# Patient Record
Sex: Female | Born: 1945 | Race: White | Hispanic: No | Marital: Single | Smoking: Current every day smoker
Health system: Southern US, Community
[De-identification: ages and names within clinical notes are randomized; demographics above are authoritative.]

## PROBLEM LIST (undated history)

## (undated) DIAGNOSIS — R110 Nausea: Secondary | ICD-10-CM

## (undated) DIAGNOSIS — Y92009 Unspecified place in unspecified non-institutional (private) residence as the place of occurrence of the external cause: Secondary | ICD-10-CM

## (undated) DIAGNOSIS — I10 Essential (primary) hypertension: Secondary | ICD-10-CM

## (undated) DIAGNOSIS — J9611 Chronic respiratory failure with hypoxia: Secondary | ICD-10-CM

## (undated) DIAGNOSIS — R634 Abnormal weight loss: Secondary | ICD-10-CM

## (undated) DIAGNOSIS — J189 Pneumonia, unspecified organism: Secondary | ICD-10-CM

## (undated) DIAGNOSIS — E871 Hypo-osmolality and hyponatremia: Secondary | ICD-10-CM

## (undated) DIAGNOSIS — R0902 Hypoxemia: Secondary | ICD-10-CM

## (undated) DIAGNOSIS — N951 Menopausal and female climacteric states: Secondary | ICD-10-CM

## (undated) DIAGNOSIS — J9690 Respiratory failure, unspecified, unspecified whether with hypoxia or hypercapnia: Secondary | ICD-10-CM

## (undated) DIAGNOSIS — F329 Major depressive disorder, single episode, unspecified: Secondary | ICD-10-CM

## (undated) DIAGNOSIS — W19XXXA Unspecified fall, initial encounter: Secondary | ICD-10-CM

## (undated) DIAGNOSIS — K219 Gastro-esophageal reflux disease without esophagitis: Secondary | ICD-10-CM

## (undated) DIAGNOSIS — E876 Hypokalemia: Secondary | ICD-10-CM

## (undated) DIAGNOSIS — F17200 Nicotine dependence, unspecified, uncomplicated: Secondary | ICD-10-CM

## (undated) DIAGNOSIS — J9621 Acute and chronic respiratory failure with hypoxia: Secondary | ICD-10-CM

## (undated) DIAGNOSIS — N179 Acute kidney failure, unspecified: Secondary | ICD-10-CM

## (undated) DIAGNOSIS — R911 Solitary pulmonary nodule: Secondary | ICD-10-CM

## (undated) DIAGNOSIS — E7800 Pure hypercholesterolemia, unspecified: Secondary | ICD-10-CM

## (undated) DIAGNOSIS — T50901A Poisoning by unspecified drugs, medicaments and biological substances, accidental (unintentional), initial encounter: Secondary | ICD-10-CM

## (undated) DIAGNOSIS — R197 Diarrhea, unspecified: Secondary | ICD-10-CM

## (undated) DIAGNOSIS — I4891 Unspecified atrial fibrillation: Secondary | ICD-10-CM

## (undated) DIAGNOSIS — R112 Nausea with vomiting, unspecified: Secondary | ICD-10-CM

## (undated) DIAGNOSIS — G9340 Encephalopathy, unspecified: Secondary | ICD-10-CM

## (undated) DIAGNOSIS — F39 Unspecified mood [affective] disorder: Secondary | ICD-10-CM

## (undated) DIAGNOSIS — I4719 Other supraventricular tachycardia: Secondary | ICD-10-CM

## (undated) DIAGNOSIS — I429 Cardiomyopathy, unspecified: Secondary | ICD-10-CM

## (undated) DIAGNOSIS — I471 Supraventricular tachycardia: Secondary | ICD-10-CM

## (undated) DIAGNOSIS — E785 Hyperlipidemia, unspecified: Secondary | ICD-10-CM

## (undated) DIAGNOSIS — M1990 Unspecified osteoarthritis, unspecified site: Secondary | ICD-10-CM

## (undated) DIAGNOSIS — K589 Irritable bowel syndrome without diarrhea: Secondary | ICD-10-CM

## (undated) DIAGNOSIS — I2510 Atherosclerotic heart disease of native coronary artery without angina pectoris: Secondary | ICD-10-CM

## (undated) DIAGNOSIS — J441 Chronic obstructive pulmonary disease with (acute) exacerbation: Secondary | ICD-10-CM

## (undated) DIAGNOSIS — F419 Anxiety disorder, unspecified: Secondary | ICD-10-CM

## (undated) DIAGNOSIS — R4182 Altered mental status, unspecified: Secondary | ICD-10-CM

## (undated) DIAGNOSIS — R600 Localized edema: Secondary | ICD-10-CM

## (undated) DIAGNOSIS — I959 Hypotension, unspecified: Secondary | ICD-10-CM

## (undated) DIAGNOSIS — G8929 Other chronic pain: Secondary | ICD-10-CM

## (undated) DIAGNOSIS — R609 Edema, unspecified: Secondary | ICD-10-CM

## (undated) HISTORY — DX: Hypoxemia: R09.02

## (undated) HISTORY — DX: Acute kidney failure, unspecified: N17.9

## (undated) HISTORY — DX: Acute and chronic respiratory failure with hypoxia: J96.21

## (undated) HISTORY — DX: Unspecified atrial fibrillation: I48.91

## (undated) HISTORY — DX: Respiratory failure, unspecified, unspecified whether with hypoxia or hypercapnia: J96.90

## (undated) HISTORY — DX: Hypokalemia: E87.6

## (undated) HISTORY — DX: Essential (primary) hypertension: I10

## (undated) HISTORY — PX: EYE SURGERY: SHX253

## (undated) HISTORY — PX: ABDOMINAL HYSTERECTOMY: SHX81

## (undated) HISTORY — DX: Diarrhea, unspecified: R19.7

## (undated) HISTORY — DX: Solitary pulmonary nodule: R91.1

## (undated) HISTORY — DX: Other supraventricular tachycardia: I47.19

## (undated) HISTORY — DX: Localized edema: R60.0

## (undated) HISTORY — DX: Abnormal weight loss: R63.4

## (undated) HISTORY — DX: Unspecified mood (affective) disorder: F39

## (undated) HISTORY — DX: Edema, unspecified: R60.9

## (undated) HISTORY — DX: Altered mental status, unspecified: R41.82

## (undated) HISTORY — DX: Atherosclerotic heart disease of native coronary artery without angina pectoris: I25.10

## (undated) HISTORY — DX: Chronic obstructive pulmonary disease with (acute) exacerbation: J44.1

## (undated) HISTORY — DX: Unspecified place in unspecified non-institutional (private) residence as the place of occurrence of the external cause: Y92.009

## (undated) HISTORY — DX: Hypotension, unspecified: I95.9

## (undated) HISTORY — DX: Nausea with vomiting, unspecified: R11.2

## (undated) HISTORY — DX: Hyperlipidemia, unspecified: E78.5

## (undated) HISTORY — DX: Encephalopathy, unspecified: G93.40

## (undated) HISTORY — DX: Cardiomyopathy, unspecified: I42.9

## (undated) HISTORY — DX: Nausea: R11.0

## (undated) HISTORY — DX: Supraventricular tachycardia: I47.1

## (undated) HISTORY — DX: Unspecified fall, initial encounter: W19.XXXA

## (undated) HISTORY — DX: Poisoning by unspecified drugs, medicaments and biological substances, accidental (unintentional), initial encounter: T50.901A

## (undated) HISTORY — DX: Hypo-osmolality and hyponatremia: E87.1

## (undated) SURGERY — UPPER ESOPHAGEAL ENDOSCOPIC ULTRASOUND (EUS)
Anesthesia: Monitor Anesthesia Care

## (undated) NOTE — *Deleted (*Deleted)
[facility]   Telephone:(336) [phone] Fax:(336) [phone]   Clinic Follow up Note   Patient Care Team: Salli Real, MD as PCP - General (Internal Medicine) Rennis Golden Lisette Abu, MD as PCP - Cardiology (Cardiology) Radonna Ricker, RN as Oncology Nurse Navigator Pollyann Samples, NP as Nurse Practitioner (Nurse Practitioner) Malachy Mood, MD as Consulting Physician (Hematology) Mansouraty, Netty Starring., MD as Consulting Physician (Gastroenterology)  Date of Service:  12/06/2019  CHIEF COMPLAINT: F/u pancreaticcancer  SUMMARY OF ONCOLOGIC HISTORY: Oncology History Overview Note  Cancer Staging Primary adenocarcinoma of body of pancreas [facility]) Staging form: Exocrine Pancreas, AJCC 8th Edition - Clinical stage from 07/08/2019: Stage III (cT4, cN1, cM0) - Signed by Malachy Mood, MD on 07/13/2019    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HCC)  12/21/2018 Imaging   CT AP w contrast IMPRESSION: 1. Large amount of stool at the level of the rectum. Correlate for symptoms of fecal impaction. 2. Persistent dilatation of the distal pancreatic duct with abrupt cutoff at the level of the pancreatic body. Further evaluation with a nonemergent outpatient contrast enhanced MRI is recommended. 3. Nonobstructive left nephrolithiasis. 4. Subacute anterior right rib fractures.   Aortic Atherosclerosis (ICD10-I70.0).   12/24/2018 Imaging   MRCP IMPRESSION: 1. Suspect a 3.5 cm infiltrating pancreatic body neoplasm causing obstruction of the main pancreatic duct and atrophy of the pancreatic tail. The pancreatic head appears normal. 2. Limited examination due to lack of IV contrast and breathing motion artifact. Difficult to assess the vascular structures for invasion. 3. Recommend endoscopic biopsy. A dedicated high-resolution pancreatic CT scan with contrast may be helpful for further evaluation. 4. No obvious metastatic disease.   12/26/2018 Tumor Marker   CA 19-9: 250     05/05/2019 Procedure   EUS by Dr. Dulce Sellar An irregular mass was identified in the pancreatic body. The mass was hypoechoic. The mass measured 24 mm by 17 mm in maximal cross-sectional diameter. The endosonographic borders were poorlydefined. The remainder of the pancreas was examined. The endosonographic appearance of parenchyma and the upstream pancreatic duct indicated duct dilation. Appeared to be tissue ingrowth into portions of the dilated pancreatic duct. Fine needle aspiration for cytology was performed.  A few abnormal lymph nodes were visualized in the peripancreatic region. The nodes were oval, hypoechoic and had well defined margins. IMPRESSION - A mass was identified in the pancreatic body. This was staged T3 N1 Mx byendosonographic criteria. Fine needle aspiration performed. 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 - A few abnormal lymph nodes were visualized in the peripancreatic region. - Pancreatic parenchymal abnormalities consisting of edema and stranding were noted in the genu of the pancreas and pancreatic body. - There was no evidence of significant pathology.   05/29/2019 Imaging   CT CAP w contrast IMPRESSION: 1. Interval increase in size of poorly defined, hypoenhancing, infiltrating tumor arising from the body and proximal tail of pancreas. 2. Progressive vascular involvement of the portal vein, portal venous confluence, splenic vein, and celiac artery. 3. No specific features of solid organ metastases or metastatic disease to the chest. 4. Similar appearance of tiny, upper lung zone predominant solid and ground-glass attenuating nodules which may be postinflammatory/infectious in etiology. 5. Aortic atherosclerosis, in addition to 3 vessel coronary artery disease. Please note that although the presence of coronary artery calcium documents the presence of coronary artery disease, the severity of this disease and any potential  stenosis cannot be assessed on this non-gated CT examination. Assessment for potential  risk factor modification, dietary therapy or pharmacologic therapy may be warranted, if clinically indicated.   Aortic Atherosclerosis (ICD10-I70.0).   07/08/2019 Procedure   Repeat EUS for biopsy and fiducial placement by Dr. Christella Hartigan   1. Irregularly shaped, heterogeneous, hypoechoic mass was noted in the body of the pancreas, measuring 2.7cm across. The mass clearly invades the portal vein and obstructs the main pancreatic duct causing upstream dilation (5-94mm in the body and tail). Preliminary cytology review was positive for malignancy (adenocarcinoma) and then 4 gold fiducials were placed into the mass using the [employer] placement system. 2. CBD was normal, non-dilated. 3. Limited views of the liver, spleen were normal.      07/08/2019 Pathology Results   FINAL MICROSCOPIC DIAGNOSIS:  - Malignant cells consistent with adenocarcinoma    07/08/2019 Cancer Staging   Staging form: Exocrine Pancreas, AJCC 8th Edition - Clinical stage from 07/08/2019: Stage III (cT4, cN1, cM0) - Signed by Malachy Mood, MD on 07/13/2019   07/12/2019 Initial Diagnosis   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HCC)   07/23/2019 - 08/06/2019 Chemotherapy   First-line Gemcitabine and Abraxane 2 weeks on/1 week off starting 07/23/19 with Gemcitabine alone for C1. Discontinued after 1 cycle due hospitalized twice for dyspnea, CHF and COPD exacerbation. I think her tolerance to chemo is very low.   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  The Common Hereditary Gene Panel offered by [facility] includes sequencing and/or deletion duplication testing of the following 56 genes: APC*, ATM*, AXIN2, BARD1, BMPR1A, BRCA1, BRCA2, BRIP1, CASR, CDH1, CDK4, CDKN2A (p14ARF), CDKN2A (p16INK4a),  CFTR*, CHEK2, CPA1, CTNNA1, CTRC, DICER1*, EPCAM*, FANCC, GREM1*, HOXB13, KIT, MEN1*, MLH1*, MSH2*, MSH3*, MSH6*, MUTYH, NBN, NF1*, NTHL1, PALB2, PALLD, PDGFRA, PMS2*, POLD1*, POLE, PRSS1*, PTEN*, RAD50, RAD51C, RAD51D, RNF43, SDHA*, SDHB, SDHC*, SDHD, SMAD4, SMARCA4, SPINK1, STK11, TP53, TSC1*, TSC2, and VHL.  The report date is September 21, 2019.      CURRENT THERAPY:  ***  INTERVAL HISTORY: *** HOANG REICH is here for a follow up. She presents to the clinic alone.    REVIEW OF SYSTEMS:  *** Constitutional: Denies fevers, chills or abnormal weight loss Eyes: Denies blurriness of vision Ears, nose, mouth, throat, and face: Denies mucositis or sore throat Respiratory: Denies cough, dyspnea or wheezes Cardiovascular: Denies palpitation, chest discomfort or lower extremity swelling Gastrointestinal:  Denies nausea, heartburn or change in bowel habits Skin: Denies abnormal skin rashes Lymphatics: Denies new lymphadenopathy or easy bruising Neurological:Denies numbness, tingling or new weaknesses Behavioral/Psych: Mood is stable, no new changes  All other systems were reviewed with the patient and are negative.  MEDICAL HISTORY:  Past Medical History:  Diagnosis Date  . Accidental drug overdose   . Acute encephalopathy   . Acute on chronic respiratory failure with hypoxia (HCC) 03/01/2017  . Acute renal failure (HCC) 04/16/2014  . AKI (acute kidney injury) (HCC) 04/30/2014  . Altered mental status 06/27/2012  . Anxiety   . Atrial fibrillation (HCC)   . CAD (coronary artery disease)   . CAP (community acquired pneumonia) 04/18/2014  . Cardiomyopathy (HCC)   . Chronic hypoxemic respiratory failure (HCC) 06/26/2012  . Chronic pain   . COPD exacerbation (HCC) 06/26/2012  . COPD with acute exacerbation (HCC) 03/01/2017  . DEPRESSIVE DISORDER, NOS 03/13/2006   Qualifier: Diagnosis of  By: Para March MD, Cheree Ditto    .  Edema, peripheral   . Essential hypertension 04/30/2014  . Fall at home,  initial encounter 03/01/2017  . GASTROESOPHAGEAL REFLUX, NO ESOPHAGITIS 03/13/2006   Qualifier: Diagnosis of  By: Para March MD, Cheree Ditto    . HYPERCHOLESTEROLEMIA 03/13/2006   Qualifier: Diagnosis of  By: Para March MD, Cheree Ditto    . Hyperlipidemia   . HYPERTENSION, BENIGN SYSTEMIC 03/13/2006   Qualifier: Diagnosis of  By: Para March MD, Cheree Ditto    . Hypokalemia 04/30/2014  . Hyponatremia 04/30/2014  . Hypotension 04/30/2014  . Hypoxemia 06/26/2012  . Hypoxia 03/26/2018  . Irritable bowel syndrome 03/13/2006   Qualifier: Diagnosis of  By: Para March MD, Cheree Ditto    . Lung nodule 04/16/2014  . MENOPAUSAL SYNDROME 03/13/2006   Qualifier: Diagnosis of  By: Para March MD, Cheree Ditto    . Mood disorder [facility]) 03/13/2006   Qualifier: Diagnosis of  By: Para March MD, Cheree Ditto    . 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 Nausea 04/30/2014  . Nausea vomiting and diarrhea 04/16/2014  . OSTEOARTHRITIS, MULTI SITES 03/13/2006   Qualifier: Diagnosis of  By: Para March MD, Cheree Ditto    . Pulmonary nodule 04/30/2014  . Respiratory failure (HCC) 01/22/2017  . TOBACCO DEPENDENCE 03/13/2006   Qualifier: Diagnosis of  By: Para March MD, Cheree Ditto    . Weight loss     SURGICAL HISTORY: Past Surgical History:  Procedure Laterality Date  . ABDOMINAL HYSTERECTOMY    . ESOPHAGOGASTRODUODENOSCOPY N/A 07/08/2019   Procedure: ESOPHAGOGASTRODUODENOSCOPY (EGD);  Surgeon: Rachael Fee, MD;  Location: [facility];  Service: Endoscopy;  Laterality: N/A;  . ESOPHAGOGASTRODUODENOSCOPY (EGD) WITH PROPOFOL N/A 05/05/2019   Procedure: ESOPHAGOGASTRODUODENOSCOPY (EGD) WITH PROPOFOL;  Surgeon: Willis Modena, MD;  Location: [facility];  Service: Endoscopy;  Laterality: N/A;  . EUS N/A 07/08/2019   Procedure: UPPER ENDOSCOPIC ULTRASOUND (EUS) RADIAL;  Surgeon: Rachael Fee, MD;  Location: [facility];  Service: Endoscopy;  Laterality: N/A;  . EYE SURGERY    . FIDUCIAL MARKER PLACEMENT N/A 07/08/2019   Procedure: FIDUCIAL MARKER PLACEMENT;  Surgeon: Rachael Fee, MD;   Location: [facility];  Service: Endoscopy;  Laterality: N/A;  . FINE NEEDLE ASPIRATION N/A 05/05/2019   Procedure: FINE NEEDLE ASPIRATION (FNA) LINEAR;  Surgeon: Willis Modena, MD;  Location: [facility];  Service: Endoscopy;  Laterality: N/A;  . FINE NEEDLE ASPIRATION N/A 07/08/2019   Procedure: FINE NEEDLE ASPIRATION (FNA) LINEAR;  Surgeon: Rachael Fee, MD;  Location: [facility];  Service: Endoscopy;  Laterality: N/A;  . IR IMAGING GUIDED PORT INSERTION  07/27/2019  . ROTATOR CUFF REPAIR Right 2012  . UPPER ESOPHAGEAL ENDOSCOPIC ULTRASOUND (EUS) N/A 05/05/2019   Procedure: UPPER ESOPHAGEAL ENDOSCOPIC ULTRASOUND (EUS) with FNA;  Surgeon: Willis Modena, MD;  Location: [facility];  Service: Endoscopy;  Laterality: N/A;    I have reviewed the social history and family history with the patient and they are unchanged from previous note.  ALLERGIES:  has No Known Allergies.  MEDICATIONS:  Current Outpatient Medications  Medication Sig Dispense Refill  . albuterol (PROVENTIL HFA;VENTOLIN HFA) 108 (90 BASE) MCG/ACT inhaler Inhale 2 puffs into the lungs every 6 (six) hours as needed for wheezing. 1 Inhaler 2  . albuterol (PROVENTIL) (2.5 MG/3ML) 0.083% nebulizer solution Take 3 mLs (2.5 mg total) by nebulization every 2 (two) hours as needed for wheezing. 75 mL 12  . Aspirin-Salicylamide-Caffeine (BC HEADACHE PO) Take 1 packet by mouth 2 (two) times daily as needed (headaches).     Marland Kitchen atorvastatin (LIPITOR) 10 MG tablet Take 1 tablet (10 mg  total) by mouth at bedtime. (Patient taking differently: Take 10 mg by mouth every evening. ) 30 tablet 0  . Calcium Carbonate (CALCIUM 500 PO) Take 500 mg by mouth daily.    . Cholecalciferol (VITAMIN D3) 50 MCG (2000 UT) capsule Take 2,000 Units by mouth daily.    Marland Kitchen FLUoxetine (PROZAC) 40 MG capsule Take 40 mg by mouth every evening.     . furosemide (LASIX) 40 MG tablet Take 1 tablet (40 mg total) by mouth daily. 30 tablet 2  . hydrALAZINE  (APRESOLINE) 50 MG tablet Take 1 tablet (50 mg total) by mouth every 8 (eight) hours. 90 tablet 0  . lidocaine-prilocaine (EMLA) cream Apply to affected area as needed weekly 30 g 3  . losartan (COZAAR) 25 MG tablet Take 1 tablet (25 mg total) by mouth daily. 90 tablet 3  . metoprolol succinate (TOPROL-XL) 25 MG 24 hr tablet TAKE 2 TABLETS EVERY DAY. TAKE WITH OR IMMEDIATELY FOLLOWING A MEAL. 180 tablet 1  . nitroGLYCERIN (NITROSTAT) 0.4 MG SL tablet Place 1 tablet (0.4 mg total) under the tongue every 5 (five) minutes as needed for chest pain. 90 tablet 3  . omeprazole (PRILOSEC) 40 MG capsule Take 40 mg by mouth every evening.    . ondansetron (ZOFRAN) 8 MG tablet Take 1 tablet (8 mg total) by mouth 2 (two) times daily as needed (Nausea or vomiting). 30 tablet 1  . Oxycodone HCl 10 MG TABS     . potassium chloride (KLOR-CON) 10 MEQ tablet Take 10 mEq by mouth every evening.     . prochlorperazine (COMPAZINE) 10 MG tablet Take 1 tablet (10 mg total) by mouth every 6 (six) hours as needed (Nausea or vomiting). 30 tablet 1   No current facility-administered medications for this visit.    PHYSICAL EXAMINATION: ECOG PERFORMANCE STATUS: {CHL ONC ECOG PS:[phone]}  There were no vitals filed for this visit. There were no vitals filed for this visit. *** GENERAL:alert, no distress and comfortable SKIN: skin color, texture, turgor are normal, no rashes or significant lesions EYES: normal, Conjunctiva are pink and non-injected, sclera clear {OROPHARYNX:no exudate, no erythema and lips, buccal mucosa, and tongue normal}  NECK: supple, thyroid normal size, non-tender, without nodularity LYMPH:  no palpable lymphadenopathy in the cervical, axillary {or inguinal} LUNGS: clear to auscultation and percussion with normal breathing effort HEART: regular rate & rhythm and no murmurs and no lower extremity edema ABDOMEN:abdomen soft, non-tender and normal bowel sounds Musculoskeletal:no cyanosis of  digits and no clubbing  NEURO: alert & oriented x 3 with fluent speech, no focal motor/sensory deficits  LABORATORY DATA:  I have reviewed the data as listed CBC Latest Ref Rng & Units 10/15/2019 09/03/2019 08/28/2019  WBC 4.0 - 10.5 K/uL 4.5 3.7(L) 5.0  Hemoglobin 12.0 - 15.0 g/dL 11.0(L) 9.0(L) 9.3(L)  Hematocrit 36 - 46 % 33.8(L) 27.8(L) 29.6(L)  Platelets 150 - 400 K/uL 171 124(L) 157     CMP Latest Ref Rng & Units 10/15/2019 09/03/2019 08/29/2019  Glucose 70 - 99 mg/dL 960(A) 91 87  BUN 8 - 23 mg/dL 11 13 54(U)  Creatinine 0.44 - 1.00 mg/dL 9.81(X) 9.14(N) 8.29(F)  Sodium 135 - 145 mmol/L 133(L) 137 140  Potassium 3.5 - 5.1 mmol/L 4.0 3.8 4.5  Chloride 98 - 111 mmol/L 102 103 103  CO2 22 - 32 mmol/L 22 24 29   Calcium 8.9 - 10.3 mg/dL 6.2(Z) 3.0(Q) 6.5(H)  Total Protein 6.5 - 8.1 g/dL 6.1(L) 5.9(L) -  Total Bilirubin 0.3 -  1.2 mg/dL 0.3 0.3 -  Alkaline Phos 38 - 126 U/L 95 102 -  AST 15 - 41 U/L 13(L) 14(L) -  ALT 0 - 44 U/L <6 13 -      RADIOGRAPHIC STUDIES: I have personally reviewed the radiological images as listed and agreed with the findings in the report. No results found.   ASSESSMENT & PLAN:  Allison Vaughan is a 35 y.o. female with   1. Pancreatic neoplasm,cT4N1M0,unresectable -Sheinitiallypresented with gradual weight loss and abdominal discomfort with low back pain for 6-12 months, CA 19-9 is elevated to 250. 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  -HerEUS/biopsyfrom 07/08/19 showed malignant cells consistent with adenocarcinoma. Due to vascular invasion, she is not a surgical candidatethus her cancer is no longer curable but still treatable to control disease and prolong her life.  -She tried systemic moderate intensitychemotherapy with gemcitabine/Abraxane on 07/23/19 with Gemcitabine alone. However this was  d/c due to hospitalizations from comorbidities. She is not able to tolerate more chemo as this time.  -She proceeded with SBRT to primary tumor with Dr Mitzi Hansen 09/24/19-11/05/19 ***   2. Chronic Pain Secondary to abdominal bloating and low back pain -Pain managed by PCP -Patient received 20 tablets on 08/29/1999 upon being discharged from the hospital on her oxycodone. -The patient only has 3 tablets left and is concerned because she reportedly requires an appointment with her PCP prior to obtaining refills. -We will refill a 3-day supply of her pain medication until she can be seen by her PCP  3.Comorbidities:COPD,Anxiety and depression,Afib, CAD, HTN, Insomnia  Continue to f/u with cardiology and PCP   4. Goals of care, Social Support -she understands because her cancer is not operable it is likely not curable. Treatment goal is palliative. -She currently does not have car and is not driving. She has some help with transportation from her daughter. I will defer to SW office to set up rides for her. She is agreeable.  5. Genetic Testing negative for pathogenetic mutations with VUS of CFTR and SDHB genes.     PLAN: ***  -Copy note to Dr Wynelle Link about taking over her pain medication and Ativan management.  -Labs reviewedand adequate to proceed with C1D8 Gemcitabine today  -Lab, flush, f/u and Gemcitabine and Abraxane in 2 and 3 weeks  -add Udenyca after day 8 chemotherapy   No problem-specific Assessment & Plan notes found for this encounter.   No orders of the defined types were placed in this encounter.  All questions were answered. The patient knows to call the clinic with any problems, questions or concerns. No barriers to learning was detected. The total time spent in the appointment was {CHL ONC TIME VISIT - AVWUJ:[id number]}.     Delphina Cahill 12/06/2019   Rogelia Rohrer, am acting as scribe for Malachy Mood, MD.   {Add scribe attestation statement}

---

## 2002-08-24 ENCOUNTER — Emergency Department (HOSPITAL_COMMUNITY): Admission: EM | Admit: 2002-08-24 | Discharge: 2002-08-24 | Payer: Self-pay

## 2003-10-17 ENCOUNTER — Other Ambulatory Visit: Admission: RE | Admit: 2003-10-17 | Discharge: 2003-10-17 | Payer: Self-pay | Admitting: Family Medicine

## 2003-10-17 ENCOUNTER — Ambulatory Visit: Payer: Self-pay | Admitting: Family Medicine

## 2003-10-28 ENCOUNTER — Encounter: Admission: RE | Admit: 2003-10-28 | Discharge: 2003-10-28 | Payer: Self-pay | Admitting: Sports Medicine

## 2004-04-04 ENCOUNTER — Encounter: Admission: RE | Admit: 2004-04-04 | Discharge: 2004-04-04 | Payer: Self-pay | Admitting: Internal Medicine

## 2004-04-06 ENCOUNTER — Encounter: Admission: RE | Admit: 2004-04-06 | Discharge: 2004-04-06 | Payer: Self-pay | Admitting: Internal Medicine

## 2004-04-08 ENCOUNTER — Encounter: Admission: RE | Admit: 2004-04-08 | Discharge: 2004-04-08 | Payer: Self-pay | Admitting: Internal Medicine

## 2004-06-08 ENCOUNTER — Ambulatory Visit: Payer: Self-pay | Admitting: Family Medicine

## 2004-07-04 ENCOUNTER — Ambulatory Visit: Payer: Self-pay | Admitting: Family Medicine

## 2004-09-13 ENCOUNTER — Ambulatory Visit: Payer: Self-pay | Admitting: Family Medicine

## 2004-10-11 ENCOUNTER — Ambulatory Visit: Payer: Self-pay | Admitting: Family Medicine

## 2004-10-18 ENCOUNTER — Ambulatory Visit: Payer: Self-pay | Admitting: Family Medicine

## 2004-11-22 ENCOUNTER — Ambulatory Visit: Payer: Self-pay | Admitting: Family Medicine

## 2004-12-27 ENCOUNTER — Ambulatory Visit: Payer: Self-pay | Admitting: Family Medicine

## 2005-01-21 ENCOUNTER — Encounter: Admission: RE | Admit: 2005-01-21 | Discharge: 2005-01-21 | Payer: Self-pay | Admitting: Sports Medicine

## 2005-03-05 ENCOUNTER — Encounter: Admission: RE | Admit: 2005-03-05 | Discharge: 2005-03-05 | Payer: Self-pay | Admitting: Orthopedic Surgery

## 2005-03-07 ENCOUNTER — Ambulatory Visit (HOSPITAL_BASED_OUTPATIENT_CLINIC_OR_DEPARTMENT_OTHER): Admission: RE | Admit: 2005-03-07 | Discharge: 2005-03-07 | Payer: Self-pay | Admitting: Orthopedic Surgery

## 2005-04-11 ENCOUNTER — Encounter: Admission: RE | Admit: 2005-04-11 | Discharge: 2005-05-16 | Payer: Self-pay | Admitting: Orthopedic Surgery

## 2005-05-28 ENCOUNTER — Ambulatory Visit: Payer: Self-pay | Admitting: Physical Medicine & Rehabilitation

## 2005-05-28 ENCOUNTER — Encounter
Admission: RE | Admit: 2005-05-28 | Discharge: 2005-08-26 | Payer: Self-pay | Admitting: Physical Medicine & Rehabilitation

## 2005-07-29 ENCOUNTER — Ambulatory Visit: Payer: Self-pay | Admitting: Physical Medicine & Rehabilitation

## 2005-09-24 ENCOUNTER — Encounter
Admission: RE | Admit: 2005-09-24 | Discharge: 2005-12-23 | Payer: Self-pay | Admitting: Physical Medicine & Rehabilitation

## 2005-09-30 ENCOUNTER — Ambulatory Visit: Payer: Self-pay | Admitting: Physical Medicine & Rehabilitation

## 2005-10-28 ENCOUNTER — Encounter
Admission: RE | Admit: 2005-10-28 | Discharge: 2006-01-26 | Payer: Self-pay | Admitting: Physical Medicine & Rehabilitation

## 2005-11-18 ENCOUNTER — Ambulatory Visit: Payer: Self-pay | Admitting: Family Medicine

## 2006-01-17 ENCOUNTER — Ambulatory Visit: Payer: Self-pay | Admitting: Physical Medicine & Rehabilitation

## 2006-02-19 ENCOUNTER — Encounter
Admission: RE | Admit: 2006-02-19 | Discharge: 2006-05-20 | Payer: Self-pay | Admitting: Physical Medicine & Rehabilitation

## 2006-02-19 ENCOUNTER — Ambulatory Visit: Payer: Self-pay | Admitting: Physical Medicine & Rehabilitation

## 2006-03-13 DIAGNOSIS — M1990 Unspecified osteoarthritis, unspecified site: Secondary | ICD-10-CM

## 2006-03-13 DIAGNOSIS — K589 Irritable bowel syndrome without diarrhea: Secondary | ICD-10-CM

## 2006-03-13 DIAGNOSIS — E7800 Pure hypercholesterolemia, unspecified: Secondary | ICD-10-CM | POA: Insufficient documentation

## 2006-03-13 DIAGNOSIS — I10 Essential (primary) hypertension: Secondary | ICD-10-CM

## 2006-03-13 DIAGNOSIS — F17200 Nicotine dependence, unspecified, uncomplicated: Secondary | ICD-10-CM

## 2006-03-13 DIAGNOSIS — N951 Menopausal and female climacteric states: Secondary | ICD-10-CM

## 2006-03-13 DIAGNOSIS — K219 Gastro-esophageal reflux disease without esophagitis: Secondary | ICD-10-CM

## 2006-03-13 DIAGNOSIS — F39 Unspecified mood [affective] disorder: Secondary | ICD-10-CM | POA: Insufficient documentation

## 2006-03-13 DIAGNOSIS — F3289 Other specified depressive episodes: Secondary | ICD-10-CM

## 2006-03-13 DIAGNOSIS — F329 Major depressive disorder, single episode, unspecified: Secondary | ICD-10-CM

## 2006-03-13 HISTORY — DX: Menopausal and female climacteric states: N95.1

## 2006-03-13 HISTORY — DX: Pure hypercholesterolemia, unspecified: E78.00

## 2006-03-13 HISTORY — DX: Unspecified mood (affective) disorder: F39

## 2006-03-13 HISTORY — DX: Major depressive disorder, single episode, unspecified: F32.9

## 2006-03-13 HISTORY — DX: Other specified depressive episodes: F32.89

## 2006-03-13 HISTORY — DX: Nicotine dependence, unspecified, uncomplicated: F17.200

## 2006-03-13 HISTORY — DX: Gastro-esophageal reflux disease without esophagitis: K21.9

## 2006-03-13 HISTORY — DX: Essential (primary) hypertension: I10

## 2006-03-13 HISTORY — DX: Unspecified osteoarthritis, unspecified site: M19.90

## 2006-03-13 HISTORY — DX: Irritable bowel syndrome, unspecified: K58.9

## 2010-01-14 HISTORY — PX: ROTATOR CUFF REPAIR: SHX139

## 2010-06-01 NOTE — Op Note (Signed)
NAME:  Allison, Vaughan NO.:  [id number]   MEDICAL RECORD NO.:  [id number]          PATIENT TYPE:  AMB   LOCATION:  DSC                          FACILITY:  [facility]   PHYSICIAN:  Loreta Ave, M.D. DATE OF BIRTH:  November 19, 1945   DATE OF PROCEDURE:  03/07/2005  DATE OF DISCHARGE:                                 OPERATIVE REPORT   PREOPERATIVE DIAGNOSES:  Right shoulder rotator cuff tear, impingement,  distal clavicle osteolysis.   POSTOPERATIVE DIAGNOSES:  Right shoulder rotator cuff tear, impingement,  distal clavicle osteolysis with also tearing along the biceps tendon,  anterior labrum tear and osteochondral fragmentation of the anterior  glenoid.   OPERATION PERFORMED:  Rectal examination under anesthesia, arthroscopy.  Debridement of labrum, glenoid, rotator cuff.  Acromioplasty.  Coracoacromial ligament release.  Excision of distal clavicle.  Mini open  repair rotator cuff tear with FiberWire suture, Concept repair system.  Excision retained intra-articular portion of biceps tendon and  reimplantation into bicipital groove with FiberWire suture through the large  drill hole.   SURGEON:  Loreta Ave, M.D.   ASSISTANT:  Genene Churn. Denton Meek.   ANESTHESIA:  General.   ESTIMATED BLOOD LOSS:  Minimal.   SPECIMENS:  None.   CULTURES:  None.   COMPLICATIONS:  None.   DRESSING:  Soft compressive with shoulder immobilizer.   DESCRIPTION OF PROCEDURE:  The patient was brought to the operating room  placed on operating table in supine position.  After adequate anesthesia had  been obtained, the right shoulder was examined.  Full motion, good  stability.  Placed in a beach chair position on the shoulder positioner,  prepped and draped in the usual sterile fashion.  Three standard portals,  anterior, posterior lateral.  Shoulder entered with blunt obturator,  distended and inspected after the arthroscope was introduced.  Anterior  labral tear debrided  at the 3 o'clock position.  Some osteochondral  fragmentation very localized in that area debrided.  Really not an  instability patten.  Articular cartilage remained intact throughout.  Capsule and ligamentous structures intact.  Attritional complete tearing  supraspinatus tendon the entire crest and in front of the cable.  What was  left of the intra-articular portion of the biceps was markedly thin, torn,  and only a few threads remaining throughout the joint.  Detached from the  top of the glenoid, the intra-articular portion debrided.  Cuff debrided.  Cannula redirected subacromially.  Obvious chronic impingement, type 2  acromion.  Cuff had torn and retracted but still very reparable and mobile.  Acromioplasty to a type 1 acromion with a shaver and high speed bur.  CA  ligament released with cautery.  Distal clavicle grade 4 changes.  Periarticular spurs, lateral centimeter of clavicle resected.  Adequacy of  decompression confirmed viewing from all portals.  Instruments and fluid  removed.  Deltoid splitting incision.  Subacromial space accessed.  Cuff  mobilized, captured with 3-weave FiberWire sutures.  Biceps tendon brought  out at the bicipital groove. Cut off so it was even and appropriate tension  for reimplantation.  Large drill hole made right  in the bicipital groove,  two small exiting drill holes.  FiberWire suture was weaved in the biceps  tendon.  Brought in the large drill hole and then each end of it brought out  through the small ones.  As that suture was tensioned, it pulled the biceps  into the large drill hole and the sutures tied on top of it.  Nice firm  reimplantation.  Series of drill holes then made in the tuberosity.  The  FiberWire sutures from the cuff were then weaved through the drill holes.  Arm abducted and sutures firmly tied down.  Regional osteopenia in the  humerus but I was still able to get a nice firm watertight closure of the  cuff.  Adequacy of  decompression confirmed digitally at time of cuff repair.  Wound copiously irrigated.  Deltoid closed with Vicryl.  Skin closed with  subcutaneous and subcuticular Vicryl.  Portals closed with 4-0 nylon.  Margin of wounds and bursa injected with Marcaine. Sterile compressive  dressing applied.  Shoulder immobilizer applied.  Anesthesia reversed.  Brought to recovery room.  Tolerated surgery well without complication.      Loreta Ave, M.D.  Electronically Signed     DFM/MEDQ  D:  03/07/2005  T:  03/08/2005  Job:  [id number]

## 2010-06-01 NOTE — Group Therapy Note (Signed)
REFERRED BY:  Loreta Ave, M.D., orthopedics.   PURPOSE OF EVALUATION:  Evaluate and treat chronic diffuse pain.   HISTORY OF PRESENT ILLNESS:  Allison Vaughan is a 65 year old adult female  referred to this office by Dr. Eulah Pont after the patient was not having her  insurance accepted on referral to Dr. Vear Clock for pain management.   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   The patient subsequently underwent extensive right shoulder surgery March 07, 2005 including right arthroscopic debridement of the glenoid labrum and  rotator cuff. She also had acromioplasty of the CA ligament released along  with excision of the distal clavicle and minimal open repair of the right  rotator cuff with fiber suture concept repair system. She also had biceps  reimplantation into the bicipital groove with FiberWire suturing. The  patient was prescribed Percocet for postoperative pain.   March 13, 2005, the patient saw Dr. Eulah Pont in followup now 6 days  postoperatively. At that time she was to start physical therapy. He wished  to stop Percocet secondary to a rash and start Norco 10/325 on an as needed  basis. He recommended a wean of pain medicines. They reported that they  would not continue pain medicines for her through their clinic.   The patient was subsequently seen by Dr. Eulah Pont April 03, 2005. At that time  she had not yet started therapy. She apparently had lost the script for  therapy. They rewrote the prescription for Norco and Robaxin. They  recommended no further pain medicines would be administered through that  office.   April 16, 2005, the patient called back in for refills with Dr. Eulah Pont. At  that time, they reported that they would not refill medicines and she was  referred to Dr. Vear Clock. Dr. Vear Clock does not handle Medicaid through his  office.   The patient  reports that she has been released by Dr. Eulah Pont. She reports  that she got a recent prescription for hydrocodone 10/650 from a Dr. Cecil Cranker.  She does not report that she can get any further medicines through that  office. She also reports that she sees Dr. Morrie Sheldon, her primary care physician,  but has not seen him recently. She also sees Dr. Toribio Harbour for her Prozac and  Xanax medications.   The patient reports that she previously took Lorcet and Norco for low back  pain along with severe neck pain and shoulder pain. She reports that she has  been off and on this medication since 2001 and that it was previously  prescribed by Dr. Ellene Route, her primary care physician before he  retired.   PAST MEDICAL HISTORY:  1.  History of cervical spondylosis at C5-6 and C6-7.  2.  History of colonic polyps with peptic ulcer disease.  3.  Degenerative disk disease of the lumbar spine.  4.  Chronic obstructive pulmonary disease.  5.  Depression.  6.  Anxiety.  7.  Hypertension.  8.  Prior appendectomy.   ALLERGIES:  No known drug allergies.   SOCIAL HISTORY:  The patient is married with 3 grown children. She and her  husband have reconciled after previously being separated. She has filed for  disability. She smokes 2 packs of cigarettes per day and reports only rare  alcohol intake. She reports minimal ability to do household chores with her  husband doing all of the  chores around the home in addition to working as a  Surveyor, [employer]. He is several years younger than her.   MEDICATIONS:  1.  Lisinopril 40 mg daily.  2.  Nexium 40 mg daily.  3.  Hydrocodone 10/650 1 tablet q.i.d.  4.  Alprazolam 2 mg q.i.d.  5.  Prozac 40 mg daily.  6.  Albuterol p.r.n.   REVIEW OF SYSTEMS:  Positive for weight gain, night sweats, __________  bleeding, constipation, diarrhea, vomiting, nausea, abdominal pain, poor  appetite, coughing, limb swelling, respiratory infection, shortness of  breath, wheezing.    PHYSICAL EXAMINATION:  GENERAL:  A well-appearing, fit, adult female in mild  acute discomfort.  VITAL SIGNS:  Blood pressure 135/79 with pulse of 71, respiratory rate 17  and O2 saturation 100% on room air.  UPPER EXTREMITIES:  She has a well healed scar over the right anterior  shoulder region. She has slight fullness of the right shoulder compared to  the left. Bilateral upper extremity exam showed 4/5 strength throughout. The  patient had decreased range of motion above shoulder height especially on  the right without complaints of pain. Sensation was intact to light touch  throughout the bilateral upper extremities.  LOWER EXTREMITIES:  Showed 4+/5 strength throughout. Bulk and tone were  normal, reflexes were 2+ and symmetrical. Sensation was intact to light  touch throughout the bilateral lower extremities.   In supine position, straight leg raise was negative bilaterally and hip  range of motion was normal bilaterally.   IMPRESSION:  1.  Diffuse pain especially involving the right shoulder where she has had      recent extensive shoulder surgery in addition to cervical and lumbar      degenerative disk disease/spondylosis.   In the office today, the patient has been doing reasonably well on her  hydrocodone 10/650, 1 tablet q.i.d. p.r.n. We refilled that for her in the  office today. Will plan on seeing her in followup in approximately 1 month's  time and if she continues to stay stable on this medication will see her on  a periodic basis with refills by call-in. She seems to be reasonably stable  at the present time. We have warned her against misuse of the medication or  diversion of the medication. She also understands that she needs to call in  5 business days prior to a refill being necessary. Will plan on seeing her  in followup in approximately 1 month's time.           ______________________________ Ellwood Dense, M.D.     DC/MedQ  D:  05/29/2005 14:13:28  T:   05/30/2005 07:41:27  Job #:  [id number]

## 2010-06-01 NOTE — Assessment & Plan Note (Signed)
Allison Vaughan returns to the clinic today for followup evaluation.  Over  the past few months, we have adjusted her pain medicines.  She continues  to take the Duragesic patch 25 mcg per hour, change q.72h.  She reports  that she is not getting as much relief on the third days and wonders  about an adjustment.  She continues to take the oxycodone for  breakthrough pain approximately 5 times per day.  She does need a refill  on each of those in the office today.  She continues to use Lidoderm  patches, but has sufficient supply at the present time.   REVIEW OF SYSTEMS:  Positive for night sweats, constipation, poor  appetite, coughing, limb swelling, wheezing, and shortness of breath.   MEDICATIONS:  1. Lisinopril 40 mg daily.  2. Oxycodone 5 mg 1 tablet p.o. 5 times per day p.r.n.  3. Nexium 40 mg daily.  4. Alprazolam 2 mg daily.  5. Prozac 40 mg daily.  6. Albuterol p.r.n.  7. Lidoderm patch 5% on 12 hours and off 12 hours daily.  8. Duragesic patch 25 mcg per hour, change q.72h.   PHYSICAL EXAM:  Well-appearing middle-aged adult female seated in a  regular chair.  Blood pressure is 179/94 with pulse 72, respiratory rate 16, and O2  saturation 99% on room air.  She has 5/5 strength in the bilateral upper extremities.  She has a well-  healed scar of her right lateral shoulder.   IMPRESSION:  Diffuse pain, especially including the right shoulder with  extensive shoulder surgery in the past.   In the office today we did continue the patient's oxycodone 1 tablet 5  times per day.  We continued her Duragesic patch at 25 mcg per hour, but  asked her to change it q.48h instead of q.72h.  That should provide her  with better pain relief on that last 24h day.  We will plan on seeing  the patient in followup in approximately 3 months' time with refills  prior to that appointment as necessary.           ______________________________  Ellwood Dense, M.D.     DC/MedQ  D:   03/21/2006 09:50:47  T:  03/21/2006 10:11:36  Job #:  [id number]

## 2010-11-14 ENCOUNTER — Other Ambulatory Visit (HOSPITAL_COMMUNITY): Payer: Self-pay | Admitting: Internal Medicine

## 2010-11-14 DIAGNOSIS — M25562 Pain in left knee: Secondary | ICD-10-CM

## 2010-11-16 ENCOUNTER — Inpatient Hospital Stay (HOSPITAL_COMMUNITY): Admission: RE | Admit: 2010-11-16 | Payer: Self-pay | Source: Ambulatory Visit

## 2010-11-23 ENCOUNTER — Inpatient Hospital Stay (HOSPITAL_COMMUNITY): Admission: RE | Admit: 2010-11-23 | Payer: Self-pay | Source: Ambulatory Visit

## 2010-12-07 ENCOUNTER — Emergency Department (HOSPITAL_COMMUNITY)
Admission: EM | Admit: 2010-12-07 | Discharge: 2010-12-07 | Disposition: A | Discharge status: Home / Self Care | Payer: Medicare Other | Attending: Emergency Medicine | Admitting: Emergency Medicine

## 2010-12-07 ENCOUNTER — Encounter: Payer: Self-pay | Admitting: *Deleted

## 2010-12-07 ENCOUNTER — Emergency Department (HOSPITAL_COMMUNITY): Payer: Medicare Other

## 2010-12-07 DIAGNOSIS — S52509A Unspecified fracture of the lower end of unspecified radius, initial encounter for closed fracture: Secondary | ICD-10-CM | POA: Insufficient documentation

## 2010-12-07 DIAGNOSIS — M25419 Effusion, unspecified shoulder: Secondary | ICD-10-CM | POA: Insufficient documentation

## 2010-12-07 DIAGNOSIS — S62109A Fracture of unspecified carpal bone, unspecified wrist, initial encounter for closed fracture: Secondary | ICD-10-CM

## 2010-12-07 DIAGNOSIS — M25539 Pain in unspecified wrist: Secondary | ICD-10-CM | POA: Insufficient documentation

## 2010-12-07 DIAGNOSIS — F17200 Nicotine dependence, unspecified, uncomplicated: Secondary | ICD-10-CM | POA: Insufficient documentation

## 2010-12-07 DIAGNOSIS — S52609A Unspecified fracture of lower end of unspecified ulna, initial encounter for closed fracture: Secondary | ICD-10-CM | POA: Insufficient documentation

## 2010-12-07 DIAGNOSIS — W010XXA Fall on same level from slipping, tripping and stumbling without subsequent striking against object, initial encounter: Secondary | ICD-10-CM | POA: Insufficient documentation

## 2010-12-07 DIAGNOSIS — M25519 Pain in unspecified shoulder: Secondary | ICD-10-CM | POA: Insufficient documentation

## 2010-12-07 NOTE — ED Notes (Signed)
Ortho at bedside.

## 2010-12-07 NOTE — ED Provider Notes (Signed)
Medical screening examination/treatment/procedure(s) were performed by non-physician practitioner and as supervising physician I was immediately available for consultation/collaboration.  Aixa Corsello R. Tami Blass, MD 12/07/10 2349 

## 2010-12-07 NOTE — ED Provider Notes (Signed)
History     CSN: [id number] Arrival date & time: 12/07/2010  3:09 PM   First MD Initiated Contact with Patient 12/07/10 2002      Chief Complaint  Patient presents with  . Arm Injury    (Consider location/radiation/quality/duration/timing/severity/associated sxs/prior treatment) HPI Comments: Patient states she slipped in leads yesterday, catching herself with an outstretched left hand.  Has had left wrist and hand pain since she states that all of the pain medicine.  She takes at home.  Is not helping the discomfort does take his pain medication for a spinal disorder  Patient is a 65 y.o. female presenting with arm injury. The history is provided by the patient.  Arm Injury  The incident occurred yesterday. The injury mechanism was a fall. No protective equipment was used. There is an injury to the left wrist. The pain is moderate. Pertinent negatives include no numbness and no weakness. There have been no prior injuries to these areas. She is right-handed.    Past Medical History  Diagnosis Date  . Hypertension     History reviewed. No pertinent past surgical history.  History reviewed. No pertinent family history.  History  Substance Use Topics  . Smoking status: Current Everyday Smoker  . Smokeless tobacco: Not on file  . Alcohol Use: Yes    OB History    Grav Para Term Preterm Abortions TAB SAB Ect Mult Living                  Review of Systems  Constitutional: Positive for activity change.  HENT: Negative.   Eyes: Negative.   Respiratory: Negative.   Cardiovascular: Negative.   Gastrointestinal: Negative.   Genitourinary: Negative.   Neurological: Negative for weakness and numbness.  Hematological: Negative.   Psychiatric/Behavioral: Negative.     Allergies  Review of patient's allergies indicates no known allergies.  Home Medications  No current outpatient prescriptions on file.  BP 117/99  Pulse 66  Temp(Src) 98.3 F (36.8 C) (Oral)  Resp 18   SpO2 100%  Physical Exam  Constitutional: She is oriented to person, place, and time. She appears well-developed and well-nourished.  HENT:  Head: Normocephalic.  Eyes: EOM are normal.  Neck: Neck supple.  Cardiovascular: Normal rate.   Musculoskeletal:       Right shoulder: She exhibits tenderness, swelling and deformity.  Neurological: She is oriented to person, place, and time.  Skin: Skin is warm and dry.  Psychiatric: She has a normal mood and affect.    ED Course  SPLINT APPLICATION Date/Time: 12/07/2010 8:33 PM Performed by: Arman Filter Authorized by: Arman Filter Consent given by: patient Patient understanding: patient states understanding of the procedure being performed Patient consent: the patient's understanding of the procedure matches consent given Test results: test results not available Site marked: the operative site was marked Location details: left wrist Splint type: sugar tong Post-procedure: The splinted body part was neurovascularly unchanged following the procedure. Patient tolerance: Patient tolerated the procedure well with no immediate complications.   (including critical care time)  Labs Reviewed - No data to display Dg Wrist Complete Left  12/07/2010  *RADIOLOGY REPORT*  Clinical Data: Fall with left wrist pain.  LEFT WRIST - COMPLETE 3+ VIEW  Comparison: None  Findings: Mildly displaced distal radial fracture noted which is predominately along the dorsal aspect of the distal radius.  No significant angulation present.  There is a nondisplaced fracture through the ulnar styloid process.  Carpal bones show normal  alignment.  IMPRESSION: Mildly displaced distal radial fracture and nondisplaced ulnar styloid fracture.  Original Report Authenticated By: Reola Calkins, M.D.   Dg Hand Complete Left  12/07/2010  *RADIOLOGY REPORT*  Clinical Data: Fall  LEFT HAND - COMPLETE 3+ VIEW  Comparison: None.  Findings: Three views of the left hand  submitted.  Degenerative changes are noted distal interphalangeal joints first, second and third finger.  Mild impacted fracture distal left radius.  Subtle fracture of the tip of the ulnar styloid.  IMPRESSION: No hand fracture is identified.  Subtle fracture of tip of the ulnar styloid.  Impacted fracture of distal left radius.  Original Report Authenticated By: Natasha Mead, M.D.     1. Wrist fracture       MDM  Fracture verses sprain         Arman Filter, NP 12/07/10 2033  Arman Filter, NP 12/07/10 2050

## 2010-12-07 NOTE — Progress Notes (Signed)
Orthopedic Tech Progress Note Patient Details:  Allison Vaughan 04-13-1945 [id number]  Other Ortho Devices Ortho Device Location: applied arm sling to (L) UE Ortho Device Interventions: Application  Type of Splint: Sugartong Splint Location: (L) UE Splint Interventions: Application    Jennye Moccasin 12/07/2010, 8:43 PM

## 2010-12-07 NOTE — ED Notes (Signed)
The pt fell yesterday in the leaves and she fell onto her lt hand wrist and pain in her lt lower ribs.  She has swelling to the hand and the wrist good radial pulse.

## 2011-01-11 ENCOUNTER — Other Ambulatory Visit: Payer: Self-pay | Admitting: Orthopedic Surgery

## 2011-01-11 ENCOUNTER — Ambulatory Visit
Admission: RE | Admit: 2011-01-11 | Discharge: 2011-01-11 | Disposition: A | Discharge status: Home / Self Care | Payer: Medicare Other | Source: Ambulatory Visit | Attending: Orthopedic Surgery | Admitting: Orthopedic Surgery

## 2011-01-11 DIAGNOSIS — M25532 Pain in left wrist: Secondary | ICD-10-CM

## 2011-01-11 DIAGNOSIS — S62102A Fracture of unspecified carpal bone, left wrist, initial encounter for closed fracture: Secondary | ICD-10-CM

## 2011-03-15 ENCOUNTER — Emergency Department (HOSPITAL_COMMUNITY)
Admission: EM | Admit: 2011-03-15 | Discharge: 2011-03-15 | Disposition: A | Discharge status: Home / Self Care | Payer: Medicare Other | Attending: Emergency Medicine | Admitting: Emergency Medicine

## 2011-03-15 ENCOUNTER — Encounter (HOSPITAL_COMMUNITY): Payer: Self-pay

## 2011-03-15 DIAGNOSIS — F411 Generalized anxiety disorder: Secondary | ICD-10-CM | POA: Insufficient documentation

## 2011-03-15 DIAGNOSIS — F419 Anxiety disorder, unspecified: Secondary | ICD-10-CM

## 2011-03-15 DIAGNOSIS — I10 Essential (primary) hypertension: Secondary | ICD-10-CM | POA: Insufficient documentation

## 2011-03-15 LAB — RAPID URINE DRUG SCREEN, HOSP PERFORMED
Amphetamines: NOT DETECTED
Barbiturates: POSITIVE — AB
Benzodiazepines: POSITIVE — AB
Cocaine: NOT DETECTED
Opiates: POSITIVE — AB
Tetrahydrocannabinol: NOT DETECTED

## 2011-03-15 LAB — URINALYSIS, ROUTINE W REFLEX MICROSCOPIC
Bilirubin Urine: NEGATIVE
Glucose, UA: NEGATIVE mg/dL
Hgb urine dipstick: NEGATIVE
Leukocytes, UA: NEGATIVE
Nitrite: NEGATIVE
Protein, ur: NEGATIVE mg/dL
Specific Gravity, Urine: 1.017 (ref 1.005–1.030)
Urobilinogen, UA: 0.2 mg/dL (ref 0.0–1.0)
pH: 7.5 (ref 5.0–8.0)

## 2011-03-15 MED ORDER — LORAZEPAM 2 MG/ML IJ SOLN
2.0000 mg | Freq: Once | INTRAMUSCULAR | Status: AC
  Administered 2011-03-15: 2 mg via INTRAMUSCULAR

## 2011-03-15 MED ORDER — LORAZEPAM 2 MG/ML IJ SOLN
INTRAMUSCULAR | Status: AC
  Administered 2011-03-15: 2 mg via INTRAMUSCULAR
  Filled 2011-03-15: qty 1

## 2011-03-15 MED ORDER — ALPRAZOLAM 1 MG PO TABS: 1.0000 mg | ORAL_TABLET | Freq: Three times a day (TID) | ORAL | Status: AC | PRN

## 2011-03-15 NOTE — ED Provider Notes (Signed)
History     CSN: [id number]  Arrival date & time 03/15/11  1237   First MD Initiated Contact with Patient 03/15/11 1415      Chief Complaint  Patient presents with  . Anxiety    (Consider location/radiation/quality/duration/timing/severity/associated sxs/prior treatment) HPI Comments: Patient ran out of her Xanax 3 days ago and has been having it worsening anxiety since. She denies any suicidal or homicidal ideations. She denies any pain. No seizures.  Patient is a 66 y.o. female presenting with anxiety. The history is provided by the patient.  Anxiety This is a recurrent problem. The current episode started 2 days ago. The problem occurs constantly. The problem has been gradually worsening. Pertinent negatives include no chest pain, no abdominal pain and no shortness of breath. The symptoms are aggravated by nothing. The symptoms are relieved by medications. She has tried nothing for the symptoms. The treatment provided no relief.    Past Medical History  Diagnosis Date  . Hypertension     History reviewed. No pertinent past surgical history.  History reviewed. No pertinent family history.  History  Substance Use Topics  . Smoking status: Current Everyday Smoker  . Smokeless tobacco: Not on file  . Alcohol Use: Yes    OB History    Grav Para Term Preterm Abortions TAB SAB Ect Mult Living                  Review of Systems  Respiratory: Negative for shortness of breath.   Cardiovascular: Negative for chest pain.  Gastrointestinal: Negative for abdominal pain.  All other systems reviewed and are negative.    Allergies  Review of patient's allergies indicates no known allergies.  Home Medications  No current outpatient prescriptions on file.  BP 116/58  Pulse 89  Temp 98.3 F (36.8 C)  Resp 20  SpO2 99%  Physical Exam  Nursing note and vitals reviewed. Constitutional: She is oriented to person, place, and time. She appears well-developed and  well-nourished. She appears distressed.  HENT:  Head: Normocephalic and atraumatic.  Eyes: EOM are normal. Pupils are equal, round, and reactive to light.  Cardiovascular: Normal rate, regular rhythm, normal heart sounds and intact distal pulses.  Exam reveals no friction rub.   No murmur heard. Pulmonary/Chest: Effort normal and breath sounds normal. She has no wheezes. She has no rales.  Abdominal: Soft. Bowel sounds are normal. She exhibits no distension. There is no tenderness. There is no rebound and no guarding.  Musculoskeletal: Normal range of motion. She exhibits no tenderness.       No edema  Neurological: She is alert and oriented to person, place, and time. No cranial nerve deficit.  Skin: Skin is warm and dry. No rash noted.  Psychiatric: Her behavior is normal. Her mood appears anxious. She expresses no homicidal and no suicidal ideation.    ED Course  Procedures (including critical care time)  Labs Reviewed  URINALYSIS, ROUTINE W REFLEX MICROSCOPIC - Abnormal; Notable for the following:    Ketones, ur TRACE (*)    All other components within normal limits  URINE RAPID DRUG SCREEN (HOSP PERFORMED) - Abnormal; Notable for the following:    Opiates POSITIVE (*)    Benzodiazepines POSITIVE (*)    Barbiturates POSITIVE (*)    All other components within normal limits   No results found.   1. Anxiety       MDM   Patient ran out of her Xanax 3 days ago and states  she is extremely anxious. Unable to discuss issues suicidal or homicidal at this time. She just states she is so anxious she needs her medications. She states she typically takes Xanax every 4-5 hours at home regularly and has been out for 3 days. She denies any seizures. Will give IM Ativan and recheck patient for more thorough history.  4:17 PM Evaluation patient feels much better. She has no complaints and wants to go home. She will follow up with her doctor on Monday.      Gwyneth Sprout,  MD 03/15/11 [phone]

## 2011-03-15 NOTE — ED Notes (Signed)
Pt very anxious about family not being present. Contacted pt's husband and he states he is on the way to hospital. Made pt aware, but continues to be anxious about situation.

## 2011-03-15 NOTE — ED Notes (Signed)
Pt now resting in stretcher with eyes closed.

## 2011-03-15 NOTE — ED Notes (Signed)
WUJ:[id number]<YN> Expected date:03/15/11<BR> Expected time:12:30 PM<BR> Means of arrival:Ambulance<BR> Comments:<BR> EMS 40 GC- anxiety/out of meds

## 2011-03-15 NOTE — ED Notes (Signed)
Pt is out of her medications and having high anxiety

## 2011-03-15 NOTE — Discharge Instructions (Signed)

## 2011-03-15 NOTE — ED Notes (Signed)
Pt continues to be demanding, yelling out for help. RN to bedside, pt insisting on [place] to get pt's husband here. Spoke to pt's husband approximately 15 min ago and he stated he was on the way. Pt spoke to husband as well. Attempted to call pt's husband again to please pt, with no answer. Reassured pt that he stated he was on the way, but pt does not believe RN-insists "no one is helping me".

## 2011-03-15 NOTE — ED Notes (Signed)
Pt demanding at bedside stating she has to go to bathroom. Pt assisted to bathroom, but became frustrated in restroom due to not being able to go. Pt making statements like "No one is helping me". Attempted to calm pt down and let her know we are here to help. NT Debbie at bedside inserting I&O cath to drain pt's bladder.

## 2011-07-15 ENCOUNTER — Emergency Department (HOSPITAL_COMMUNITY)
Admission: EM | Admit: 2011-07-15 | Discharge: 2011-07-15 | Disposition: A | Discharge status: Home Health | Payer: Medicare Other | Attending: Emergency Medicine | Admitting: Emergency Medicine

## 2011-07-15 ENCOUNTER — Encounter (HOSPITAL_COMMUNITY): Payer: Self-pay | Admitting: Emergency Medicine

## 2011-07-15 DIAGNOSIS — F411 Generalized anxiety disorder: Secondary | ICD-10-CM | POA: Insufficient documentation

## 2011-07-15 DIAGNOSIS — G8929 Other chronic pain: Secondary | ICD-10-CM | POA: Insufficient documentation

## 2011-07-15 DIAGNOSIS — F17200 Nicotine dependence, unspecified, uncomplicated: Secondary | ICD-10-CM | POA: Insufficient documentation

## 2011-07-15 DIAGNOSIS — I10 Essential (primary) hypertension: Secondary | ICD-10-CM | POA: Insufficient documentation

## 2011-07-15 DIAGNOSIS — F419 Anxiety disorder, unspecified: Secondary | ICD-10-CM

## 2011-07-15 HISTORY — DX: Other chronic pain: G89.29

## 2011-07-15 HISTORY — DX: Anxiety disorder, unspecified: F41.9

## 2011-07-15 MED ORDER — LORAZEPAM 1 MG PO TABS
2.0000 mg | ORAL_TABLET | Freq: Once | ORAL | Status: AC
  Administered 2011-07-15: 2 mg via ORAL
  Filled 2011-07-15: qty 2

## 2011-07-15 MED ORDER — ALPRAZOLAM 1 MG PO TABS: 1.0000 mg | ORAL_TABLET | Freq: Three times a day (TID) | ORAL | Status: AC | PRN

## 2011-07-15 NOTE — Discharge Instructions (Signed)

## 2011-07-15 NOTE — ED Provider Notes (Signed)
History    This chart was scribed for Allison Melter, MD, MD by Smitty Pluck. The patient was seen in room TR02C and the patient's care was started at 11:41AM.   CSN: [id number]  Arrival date & time 07/15/11  1012   First MD Initiated Contact with Patient 07/15/11 1128      Chief Complaint  Patient presents with  . Medication Refill  . Anxiety    (Consider location/radiation/quality/duration/timing/severity/associated sxs/prior treatment) Patient is a 66 y.o. female presenting with anxiety. The history is provided by the patient.  Anxiety Pertinent negatives include no shortness of breath.   Allison Vaughan is a 66 y.o. female who presents to the Emergency Department needing medication refill. Pt reports not having her medication since yesterday. Pt reports that she has an appointment July 8 with PCP to get referred to psychiatrist. PCP decided not to prescribe her xanax anymore. Pt reports that she is anxious and "breaking down." She reports that she has decreased appetite and liquid intake. Pt lives with husband. Symptoms have been constant. There is no radiation.  PCP is Dr. Elise Benne. She is hoping that her PCP will refer her to a psychiatrist for ongoing care.  Past Medical History  Diagnosis Date  . Hypertension   . Chronic pain   . Anxiety     History reviewed. No pertinent past surgical history.  History reviewed. No pertinent family history.  History  Substance Use Topics  . Smoking status: Current Everyday Smoker  . Smokeless tobacco: Not on file  . Alcohol Use: Yes    OB History    Grav Para Term Preterm Abortions TAB SAB Ect Mult Living                  Review of Systems  Constitutional: Negative for fever and chills.  Respiratory: Negative for cough and shortness of breath.   Gastrointestinal: Negative for nausea, vomiting and diarrhea.  Neurological: Positive for tremors.  All other systems reviewed and are negative.    Allergies  Review of  patient's allergies indicates no known allergies.  Home Medications   Current Outpatient Rx  Name Route Sig Dispense Refill  . ALPRAZOLAM 1 MG PO TABS Oral Take 1 mg by mouth at bedtime as needed. For anxiety    . FLUOXETINE HCL 40 MG PO CAPS Oral Take 80 mg by mouth daily.    Marland Kitchen LOSARTAN POTASSIUM-HCTZ 100-25 MG PO TABS Oral Take 1 tablet by mouth 2 (two) times daily.    Marland Kitchen OMEPRAZOLE 40 MG PO CPDR Oral Take 40 mg by mouth 2 (two) times daily.    Marland Kitchen ALPRAZOLAM 1 MG PO TABS Oral Take 1 tablet (1 mg total) by mouth 3 (three) times daily as needed for anxiety. 30 tablet 0    BP 131/98  Pulse 85  Temp 98 F (36.7 C) (Oral)  Resp 18  SpO2 100%  Physical Exam  Nursing note and vitals reviewed. Constitutional: She is oriented to person, place, and time. She appears well-developed and well-nourished. No distress.  HENT:  Head: Normocephalic and atraumatic.  Eyes: EOM are normal. Pupils are equal, round, and reactive to light.  Neck: Normal range of motion. Neck supple. No tracheal deviation present.  Cardiovascular: Normal rate.   Pulmonary/Chest: Effort normal. No respiratory distress.  Abdominal: Soft. She exhibits no distension.  Neurological: She is alert and oriented to person, place, and time.       Mild tremor   Skin: Skin is warm and  dry.  Psychiatric: Her behavior is normal. Judgment and thought content normal.       Anxious  Distracted  Cooperative     ED Course  Procedures (including critical care time) DIAGNOSTIC STUDIES: Oxygen Saturation is 100% on room air, normal by my interpretation.    COORDINATION OF CARE: 11:47AM EDP orders medication: ativan 2 mg I didn't query to [facility] Controlled substance reporting data Bank- the last listed chronic. Treatment was March 2013; when she got MS Contin, oxycodone, and Xanax  Labs Reviewed - No data to display No results found.   1. Anxiety       MDM  Medications noncompliant plan ongoing anxiety with limited  resources for ongoing care. I will provide short term treatment for anxiety until she can followup with her doctor.  Plan: Home Medications- Xanax 1 mg # 30; Home Treatments- rest; Recommended follow up- PCP as scheduled  I personally performed the services described in this documentation, which was scribed in my presence. The recorded information has been reviewed and considered.         Allison Melter, MD 07/15/11 1224

## 2011-07-15 NOTE — ED Notes (Signed)
Pt c/o being out of pain meds and xanax since yesterday; pt sts takes xanax and oxycodone; pt sts feels anxious

## 2011-07-15 NOTE — ED Provider Notes (Signed)
History     CSN: [id number]  Arrival date & time 07/15/11  1012   First MD Initiated Contact with Patient 07/15/11 1128      Chief Complaint  Patient presents with  . Medication Refill  . Anxiety    (Consider location/radiation/quality/duration/timing/severity/associated sxs/prior treatment) HPI Comments: Allison Vaughan is a 66 y.o. Female who is with husband and is nervos and beginning to be confused after stopping Xanax, when she ran out several days ago. She has been weaned from a  Mg to 1 mg dosage. Her PCP is referring her to another provider for chronic pain treatment. She denies other problems.  The history is provided by the patient.    Past Medical History  Diagnosis Date  . Hypertension   . Chronic pain   . Anxiety     History reviewed. No pertinent past surgical history.  History reviewed. No pertinent family history.  History  Substance Use Topics  . Smoking status: Current Everyday Smoker  . Smokeless tobacco: Not on file  . Alcohol Use: Yes    OB History    Grav Para Term Preterm Abortions TAB SAB Ect Mult Living                  Review of Systems  All other systems reviewed and are negative.    Allergies  Review of patient's allergies indicates no known allergies.  Home Medications   Current Outpatient Rx  Name Route Sig Dispense Refill  . ALPRAZOLAM 1 MG PO TABS Oral Take 1 mg by mouth at bedtime as needed. For anxiety    . FLUOXETINE HCL 40 MG PO CAPS Oral Take 80 mg by mouth daily.    Marland Kitchen LOSARTAN POTASSIUM-HCTZ 100-25 MG PO TABS Oral Take 1 tablet by mouth 2 (two) times daily.    Marland Kitchen OMEPRAZOLE 40 MG PO CPDR Oral Take 40 mg by mouth 2 (two) times daily.    Marland Kitchen ALPRAZOLAM 1 MG PO TABS Oral Take 1 tablet (1 mg total) by mouth 3 (three) times daily as needed for anxiety. 30 tablet 0    BP 131/98  Pulse 85  Temp 98 F (36.7 C) (Oral)  Resp 18  SpO2 100%  Physical Exam  Nursing note and vitals reviewed. Constitutional: She is  oriented to person, place, and time. She appears well-developed and well-nourished.  HENT:  Head: Normocephalic and atraumatic.  Eyes: Conjunctivae and EOM are normal. Pupils are equal, round, and reactive to light.  Neck: Normal range of motion and phonation normal. Neck supple.  Cardiovascular: Normal rate, regular rhythm and intact distal pulses.   Pulmonary/Chest: Effort normal and breath sounds normal. She exhibits no tenderness.  Abdominal: Soft. She exhibits no distension. There is no tenderness. There is no guarding.  Musculoskeletal: Normal range of motion.  Neurological: She is alert and oriented to person, place, and time. She has normal strength. She exhibits normal muscle tone.  Skin: Skin is warm and dry.  Psychiatric: Her behavior is normal. Judgment and thought content normal.       anxious    ED Course  Procedures (including critical care time)  Labs Reviewed - No data to display No results found.   1. Anxiety       MDM  Sudden stoppage of Xanax, leading to Benzo . withdrawal syndrome. Doubt metabolic instability, serious bacterial infection or impending vascular collapse; the patient is stable for discharge.   Plan: Home Medications- Xanax X 10 days; Home Treatments- rest; Recommended  follow up- PCP asap       [name], MD 07/16/11 1002

## 2012-06-06 ENCOUNTER — Emergency Department (HOSPITAL_COMMUNITY): Payer: Medicare Other

## 2012-06-06 ENCOUNTER — Inpatient Hospital Stay (HOSPITAL_COMMUNITY)
Admission: EM | Admit: 2012-06-06 | Discharge: 2012-06-09 | DRG: 190 | Disposition: A | Discharge status: Home / Self Care | Payer: Medicare Other | Attending: Internal Medicine | Admitting: Internal Medicine

## 2012-06-06 ENCOUNTER — Encounter (HOSPITAL_COMMUNITY): Payer: Self-pay

## 2012-06-06 DIAGNOSIS — F17200 Nicotine dependence, unspecified, uncomplicated: Secondary | ICD-10-CM | POA: Diagnosis present

## 2012-06-06 DIAGNOSIS — K589 Irritable bowel syndrome without diarrhea: Secondary | ICD-10-CM

## 2012-06-06 DIAGNOSIS — F411 Generalized anxiety disorder: Secondary | ICD-10-CM | POA: Diagnosis present

## 2012-06-06 DIAGNOSIS — E876 Hypokalemia: Secondary | ICD-10-CM | POA: Diagnosis present

## 2012-06-06 DIAGNOSIS — F329 Major depressive disorder, single episode, unspecified: Secondary | ICD-10-CM

## 2012-06-06 DIAGNOSIS — R0989 Other specified symptoms and signs involving the circulatory and respiratory systems: Secondary | ICD-10-CM

## 2012-06-06 DIAGNOSIS — G8929 Other chronic pain: Secondary | ICD-10-CM | POA: Diagnosis present

## 2012-06-06 DIAGNOSIS — E871 Hypo-osmolality and hyponatremia: Secondary | ICD-10-CM | POA: Diagnosis present

## 2012-06-06 DIAGNOSIS — E7800 Pure hypercholesterolemia, unspecified: Secondary | ICD-10-CM

## 2012-06-06 DIAGNOSIS — I10 Essential (primary) hypertension: Secondary | ICD-10-CM | POA: Diagnosis present

## 2012-06-06 DIAGNOSIS — J9600 Acute respiratory failure, unspecified whether with hypoxia or hypercapnia: Secondary | ICD-10-CM | POA: Diagnosis present

## 2012-06-06 DIAGNOSIS — M1990 Unspecified osteoarthritis, unspecified site: Secondary | ICD-10-CM

## 2012-06-06 DIAGNOSIS — K219 Gastro-esophageal reflux disease without esophagitis: Secondary | ICD-10-CM

## 2012-06-06 DIAGNOSIS — N951 Menopausal and female climacteric states: Secondary | ICD-10-CM

## 2012-06-06 DIAGNOSIS — J441 Chronic obstructive pulmonary disease with (acute) exacerbation: Principal | ICD-10-CM | POA: Diagnosis present

## 2012-06-06 DIAGNOSIS — R0603 Acute respiratory distress: Secondary | ICD-10-CM

## 2012-06-06 LAB — CBC
Hemoglobin: 13.5 g/dL (ref 12.0–15.0)
MCH: 29.7 pg (ref 26.0–34.0)
MCV: 80.2 fL (ref 78.0–100.0)
RBC: 4.55 MIL/uL (ref 3.87–5.11)
WBC: 4.8 10*3/uL (ref 4.0–10.5)

## 2012-06-06 LAB — BASIC METABOLIC PANEL
CO2: 27 mEq/L (ref 19–32)
Chloride: 83 mEq/L — ABNORMAL LOW (ref 96–112)
Glucose, Bld: 120 mg/dL — ABNORMAL HIGH (ref 70–99)
Potassium: 3.7 mEq/L (ref 3.5–5.1)
Sodium: 121 mEq/L — ABNORMAL LOW (ref 135–145)

## 2012-06-06 MED ORDER — ALBUTEROL SULFATE (5 MG/ML) 0.5% IN NEBU
INHALATION_SOLUTION | RESPIRATORY_TRACT | Status: AC
  Filled 2012-06-06: qty 1

## 2012-06-06 MED ORDER — LEVOFLOXACIN IN D5W 750 MG/150ML IV SOLN
750.0000 mg | Freq: Once | INTRAVENOUS | Status: DC
  Administered 2012-06-06: 750 mg via INTRAVENOUS
  Filled 2012-06-06: qty 150

## 2012-06-06 MED ORDER — ONDANSETRON HCL 4 MG/2ML IJ SOLN: 4.0000 mg | Freq: Three times a day (TID) | INTRAMUSCULAR | Status: DC | PRN

## 2012-06-06 MED ORDER — GUAIFENESIN ER 600 MG PO TB12
1200.0000 mg | ORAL_TABLET | Freq: Two times a day (BID) | ORAL | Status: DC | PRN
  Administered 2012-06-08: 1200 mg via ORAL
  Filled 2012-06-06 (×2): qty 2

## 2012-06-06 MED ORDER — OXYCODONE HCL 5 MG PO TABS
10.0000 mg | ORAL_TABLET | Freq: Three times a day (TID) | ORAL | Status: DC | PRN
  Administered 2012-06-07 – 2012-06-09 (×6): 10 mg via ORAL
  Filled 2012-06-06: qty 2
  Filled 2012-06-06: qty 1
  Filled 2012-06-06 (×3): qty 2
  Filled 2012-06-06: qty 1
  Filled 2012-06-06: qty 2

## 2012-06-06 MED ORDER — ALBUTEROL SULFATE (5 MG/ML) 0.5% IN NEBU
2.5000 mg | INHALATION_SOLUTION | RESPIRATORY_TRACT | Status: DC | PRN
  Filled 2012-06-06: qty 0.5

## 2012-06-06 MED ORDER — PANTOPRAZOLE SODIUM 40 MG PO TBEC
40.0000 mg | DELAYED_RELEASE_TABLET | Freq: Every day | ORAL | Status: DC
  Administered 2012-06-07 – 2012-06-09 (×3): 40 mg via ORAL
  Filled 2012-06-06 (×3): qty 1

## 2012-06-06 MED ORDER — GUAIFENESIN 100 MG/5ML PO SOLN
200.0000 mg | Freq: Three times a day (TID) | ORAL | Status: DC | PRN
  Filled 2012-06-06: qty 10

## 2012-06-06 MED ORDER — LOSARTAN POTASSIUM 50 MG PO TABS
100.0000 mg | ORAL_TABLET | Freq: Two times a day (BID) | ORAL | Status: DC
  Administered 2012-06-07 – 2012-06-09 (×5): 100 mg via ORAL
  Filled 2012-06-06 (×7): qty 2

## 2012-06-06 MED ORDER — IPRATROPIUM BROMIDE 0.02 % IN SOLN
RESPIRATORY_TRACT | Status: AC
  Administered 2012-06-06: 0.5 mg
  Filled 2012-06-06: qty 2.5

## 2012-06-06 MED ORDER — ALPRAZOLAM 1 MG PO TABS
1.0000 mg | ORAL_TABLET | Freq: Three times a day (TID) | ORAL | Status: DC | PRN
  Administered 2012-06-07 – 2012-06-09 (×6): 1 mg via ORAL
  Filled 2012-06-06 (×6): qty 1

## 2012-06-06 MED ORDER — METHYLPREDNISOLONE SODIUM SUCC 125 MG IJ SOLR
125.0000 mg | Freq: Once | INTRAMUSCULAR | Status: AC
  Administered 2012-06-06: 125 mg via INTRAVENOUS
  Filled 2012-06-06: qty 2

## 2012-06-06 MED ORDER — LEVOFLOXACIN IN D5W 750 MG/150ML IV SOLN: 750.0000 mg | INTRAVENOUS | Status: DC

## 2012-06-06 MED ORDER — ALBUTEROL SULFATE (5 MG/ML) 0.5% IN NEBU
2.5000 mg | INHALATION_SOLUTION | Freq: Four times a day (QID) | RESPIRATORY_TRACT | Status: DC
  Administered 2012-06-06 – 2012-06-07 (×2): 2.5 mg via RESPIRATORY_TRACT
  Filled 2012-06-06: qty 0.5

## 2012-06-06 MED ORDER — ALBUTEROL (5 MG/ML) CONTINUOUS INHALATION SOLN
10.0000 mg/h | INHALATION_SOLUTION | RESPIRATORY_TRACT | Status: DC
  Administered 2012-06-06: 10 mg/h via RESPIRATORY_TRACT
  Filled 2012-06-06: qty 20

## 2012-06-06 MED ORDER — ALPRAZOLAM 0.5 MG PO TABS
0.5000 mg | ORAL_TABLET | Freq: Once | ORAL | Status: AC
  Administered 2012-06-06: 0.5 mg via ORAL
  Filled 2012-06-06: qty 1

## 2012-06-06 MED ORDER — FLUOXETINE HCL 20 MG PO CAPS
40.0000 mg | ORAL_CAPSULE | Freq: Every morning | ORAL | Status: DC
  Administered 2012-06-07 – 2012-06-09 (×3): 40 mg via ORAL
  Filled 2012-06-06 (×3): qty 2

## 2012-06-06 MED ORDER — SODIUM CHLORIDE 0.9 % IV SOLN
INTRAVENOUS | Status: AC
  Administered 2012-06-07: via INTRAVENOUS

## 2012-06-06 MED ORDER — ALBUTEROL SULFATE (5 MG/ML) 0.5% IN NEBU: 2.5000 mg | INHALATION_SOLUTION | RESPIRATORY_TRACT | Status: DC

## 2012-06-06 MED ORDER — METHYLPREDNISOLONE SODIUM SUCC 125 MG IJ SOLR
80.0000 mg | Freq: Four times a day (QID) | INTRAMUSCULAR | Status: DC
  Administered 2012-06-07: 80 mg via INTRAVENOUS
  Filled 2012-06-06 (×6): qty 1.28

## 2012-06-06 MED ORDER — HYDROCHLOROTHIAZIDE 25 MG PO TABS
25.0000 mg | ORAL_TABLET | Freq: Two times a day (BID) | ORAL | Status: DC
  Administered 2012-06-07: 25 mg via ORAL
  Filled 2012-06-06 (×3): qty 1

## 2012-06-06 MED ORDER — ALBUTEROL SULFATE (5 MG/ML) 0.5% IN NEBU
5.0000 mg | INHALATION_SOLUTION | Freq: Once | RESPIRATORY_TRACT | Status: AC
  Administered 2012-06-06: 5 mg via RESPIRATORY_TRACT

## 2012-06-06 MED ORDER — SODIUM CHLORIDE 0.9 % IV SOLN: INTRAVENOUS | Status: DC

## 2012-06-06 MED ORDER — METHYLPREDNISOLONE SODIUM SUCC 125 MG IJ SOLR: 60.0000 mg | Freq: Two times a day (BID) | INTRAMUSCULAR | Status: DC

## 2012-06-06 MED ORDER — ENOXAPARIN SODIUM 30 MG/0.3ML ~~LOC~~ SOLN
30.0000 mg | Freq: Every day | SUBCUTANEOUS | Status: DC
  Administered 2012-06-07 – 2012-06-08 (×2): 30 mg via SUBCUTANEOUS
  Filled 2012-06-06 (×4): qty 0.3

## 2012-06-06 MED ORDER — LOSARTAN POTASSIUM-HCTZ 100-25 MG PO TABS: 1.0000 | ORAL_TABLET | Freq: Two times a day (BID) | ORAL | Status: DC

## 2012-06-06 MED ORDER — METHYLPREDNISOLONE SODIUM SUCC 125 MG IJ SOLR: 80.0000 mg | Freq: Four times a day (QID) | INTRAMUSCULAR | Status: DC

## 2012-06-06 NOTE — ED Notes (Signed)
Patient says sob and cough have been ongoing x5 days progressively getting worse. Patient with productive cough, clear-white sputum noted. Patient currently receiving continuous neb tx at this time. Patient given crackers and water to have at completion of neb tx.

## 2012-06-06 NOTE — ED Provider Notes (Signed)
History     CSN: [id number]  Arrival date & time 06/06/12  1826   First MD Initiated Contact with Patient 06/06/12 1842      Chief Complaint  Patient presents with  . Shortness of Breath    (Consider location/radiation/quality/duration/timing/severity/associated sxs/prior treatment) HPI Comments: 67 year old female with a history of COPD who currently smokes cigarettes, presents with approximately one week of gradual onset of persistent gradually worsening cough and shortness of breath. Her shortness of breath is associated with wheezing and subjective fevers but no swelling of her lower extremities, abdominal pain or any significant chest pain. This seemed to temporarily improve when she uses her home nebulizer treatments but has rebound symptoms with a short time after that. She has not been on any steroid therapy in some time and the last time she took prednisone she had a strange feeling and has requested not to be on prednisone anymore. This is not an allergic reaction by her description.  Patient is a 67 y.o. female presenting with shortness of breath. The history is provided by the patient and a relative.  Shortness of Breath   Past Medical History  Diagnosis Date  . Hypertension   . Chronic pain   . Anxiety   . COPD (chronic obstructive pulmonary disease)     Past Surgical History  Procedure Laterality Date  . Eye surgery      No family history on file.  History  Substance Use Topics  . Smoking status: Current Every Day Smoker -- 1.00 packs/day    Types: Cigarettes  . Smokeless tobacco: Not on file  . Alcohol Use: 1.2 oz/week    2 Cans of beer per week     Comment: per week    OB History   Grav Para Term Preterm Abortions TAB SAB Ect Mult Living                  Review of Systems  Respiratory: Positive for shortness of breath.   All other systems reviewed and are negative.    Allergies  Review of patient's allergies indicates no known  allergies.  Home Medications   Current Outpatient Rx  Name  Route  Sig  Dispense  Refill  . ALPRAZolam (XANAX) 1 MG tablet   Oral   Take 1 mg by mouth at bedtime as needed. For anxiety         . FLUoxetine (PROZAC) 40 MG capsule   Oral   Take 40 mg by mouth every morning.          Marland Kitchen guaiFENesin (MUCINEX) 600 MG 12 hr tablet   Oral   Take 1,200 mg by mouth 2 (two) times daily as needed for congestion.         Marland Kitchen guaiFENesin (ROBITUSSIN) 100 MG/5ML liquid   Oral   Take 200 mg by mouth 3 (three) times daily as needed for cough.         . losartan-hydrochlorothiazide (HYZAAR) 100-25 MG per tablet   Oral   Take 1 tablet by mouth 2 (two) times daily.         Marland Kitchen omeprazole (PRILOSEC) 40 MG capsule   Oral   Take 40 mg by mouth daily.          Marland Kitchen oxyCODONE-acetaminophen (PERCOCET) 10-325 MG per tablet   Oral   Take 1 tablet by mouth every 4 (four) hours as needed for pain.         . Pseudoeph-Doxylamine-DM-APAP (NYQUIL MULTI-SYMPTOM PO)  Oral   Take 30 mLs by mouth every 6 (six) hours as needed (for cold).           BP 149/86  Pulse 73  Temp(Src) 98.4 F (36.9 C) (Oral)  Resp 22  SpO2 94%  Physical Exam  Nursing note and vitals reviewed. Constitutional: She appears well-developed and well-nourished.  HENT:  Head: Normocephalic and atraumatic.  Mouth/Throat: Oropharynx is clear and moist. No oropharyngeal exudate.  Eyes: Conjunctivae and EOM are normal. Pupils are equal, round, and reactive to light. Right eye exhibits no discharge. Left eye exhibits no discharge. No scleral icterus.  Neck: Normal range of motion. Neck supple. No JVD present. No thyromegaly present.  Cardiovascular: Normal rate, regular rhythm, normal heart sounds and intact distal pulses.  Exam reveals no gallop and no friction rub.   No murmur heard. Pulmonary/Chest: She has wheezes. She has no rales.  Increased work of breathing, speaks in short sentences, mild accessory muscle use,  prolonged expiratory phase with expiratory wheezing as well as occasional inspiratory rales that clear with coughing.  Abdominal: Soft. Bowel sounds are normal. She exhibits no distension and no mass. There is no tenderness.  Musculoskeletal: Normal range of motion. She exhibits edema ( Scant bilateral lower extremity edema). She exhibits no tenderness.  Lymphadenopathy:    She has no cervical adenopathy.  Neurological: She is alert. Coordination normal.  Skin: Skin is warm and dry. No rash noted. No erythema.  Psychiatric: She has a normal mood and affect. Her behavior is normal.    ED Course  Procedures (including critical care time)  Labs Reviewed  BASIC METABOLIC PANEL - Abnormal; Notable for the following:    Sodium 121 (*)    Chloride 83 (*)    Glucose, Bld 120 (*)    GFR calc non Af Amer 58 (*)    GFR calc Af Amer 68 (*)    All other components within normal limits  CBC - Abnormal; Notable for the following:    MCHC 37.0 (*)    All other components within normal limits  POCT I-STAT TROPONIN I   Dg Chest 2 View  06/06/2012   *RADIOLOGY REPORT*  Clinical Data: Cough, congestion and shortness of breath; history of smoking.  CHEST - 2 VIEW  Comparison: Chest radiograph performed 03/05/2005  Findings: The lungs are well-aerated and clear.  There is no evidence of focal opacification, pleural effusion or pneumothorax.  The heart is normal in size; calcification is noted in the aortic arch.  No acute osseous abnormalities are seen.  IMPRESSION: No acute cardiopulmonary process seen.   Original Report Authenticated By: Tonia Ghent, M.D.     1. Respiratory distress   2. COPD exacerbation   3. Hyponatremia       MDM  The patient appears to be in mild respiratory distress, she is hypoxic to 92% on room air but has no tachycardia, no cyanosis and strong pulses at the radial arteries. She has peripheral edema, has no history of congestive heart failure but states that she always  has a small amount of swelling on her legs. She does have abnormal lung sounds which could be consistent with a pneumonia as well but has no fever, no tachycardia and no hypotension. Will start albuterol and Atrovent therapy, Solu-Medrol IV and 2 view chest.  ED ECG REPORT  I personally interpreted this EKG   Date: 06/06/2012   Rate: 74  Rhythm: normal sinus rhythm  QRS Axis: normal  Intervals: normal  ST/T Wave abnormalities: nonspecific T wave changes  Conduction Disutrbances:none  Narrative Interpretation:   Old EKG Reviewed: Compared with 03/05/2005, no significant changes, T wave abnormalities in precordial leads persist  This improved slightly with her Solu-Medrol and meds, hyponatremia is no evident and the patient will need admission to the hospital for severe hyponatremia.  He does obtaining a continuous nebulizer for her severe shortness of breath as well as for her severe hyponatremia she has had frequent re\re evaluations, cardiac monitoring, continuous nebulizer therapy and saline therapy. Critical care has been provided.  I have counseled with the hospitalist who will evaluate the patient for admission.  CRITICAL CARE Performed by: Vida Roller Total critical care time: 35 Critical care time was exclusive of separately billable procedures and treating other patients. Critical care was necessary to treat or prevent imminent or life-threatening deterioration. Critical care was time spent personally by me on the following activities: development of treatment plan with patient and/or surrogate as well as nursing, discussions with consultants, evaluation of patient's response to treatment, examination of patient, obtaining history from patient or surrogate, ordering and performing treatments and interventions, ordering and review of laboratory studies, ordering and review of radiographic studies, pulse oximetry and re-evaluation of patient's condition.       Vida Roller,  MD 06/06/12 2201

## 2012-06-06 NOTE — ED Notes (Signed)
Discussed with patient need to stop smoking to improve her overall health.

## 2012-06-06 NOTE — ED Notes (Signed)
Having SOB for past week and has worsened particularly the last 3 days. Last albuterol treatment was 10 am. Has hx of COPD.

## 2012-06-06 NOTE — H&P (Signed)
[facility] History and Physical  Allison Vaughan EXB:[id number] DOB: [birth year] DOA: 06/06/2012  Referring physician: ED physician PCP: Coralee Rud, PA-C   Chief Complaint: Shortness of breath   HPI:  Patient is 67 year old female with COPD who presents to [facility] long emergency department with progressively worsening shortness of breath that she initially noted several days prior to admission, initially present with exertion only but now present at rest as well, associated with productive cough of clear to yellow sputum, wheezing, subjective fevers and chills, generalized fatigue. Patient reports similar events in the past that were associated with COPD flare. Patient explains she does not have pulmonologist, she's not using any specific inhalers or nebulizers at home. Patient denies abdominal or urinary concerns.  Assessment and Plan: Acute respiratory failure secondary to COPD exacerbation - Will admit patient to telemetry unit, start providing supportive care with nebulizers scheduled and as needed - Will start Solu-Medrol IV, oxygen via nasal cannula - Will send sputum for analysis, culture and Gram stain, urine Legionella and strep pneumo - Will also start empiric antibiotic Levaquin IV - Patient will need referral to pulmonologist prior to discharge  Hyponatremia  - Unclear etiology, could be related to progressive lung disease COPD versus prerenal etiology - Will provide IV fluids and repeat BMP in the morning  Code Status: Full Family Communication: Pt at bedside Disposition Plan: Admit to telemetry bed  Review of Systems:  Constitutional: Positive for fever, chills and malaise/fatigue. Negative for diaphoresis.  HENT: Negative for hearing loss, ear pain, nosebleeds, congestion, sore throat, neck pain, tinnitus and ear discharge.   Eyes: Negative for blurred vision, double vision, photophobia, pain, discharge and redness.  Respiratory: Positive for cough, sputum  production, shortness of breath, wheezing Cardiovascular: Negative for chest pain, palpitations, orthopnea, claudication and leg swelling.  Gastrointestinal: Negative for nausea, vomiting and abdominal pain. Negative for heartburn, constipation, blood in stool and melena.  Genitourinary: Negative for dysuria, urgency, frequency, hematuria and flank pain.  Musculoskeletal: Negative for myalgias, back pain, joint pain and falls.  Skin: Negative for itching and rash.  Neurological: Negative for dizziness and weakness. Negative for tingling, tremors, sensory change, speech change, focal weakness, loss of consciousness and headaches.  Endo/Heme/Allergies: Negative for environmental allergies and polydipsia. Does not bruise/bleed easily.  Psychiatric/Behavioral: Negative for suicidal ideas. The patient is not nervous/anxious.      Past Medical History  Diagnosis Date  . Hypertension   . Chronic pain   . Anxiety   . COPD (chronic obstructive pulmonary disease)     Past Surgical History  Procedure Laterality Date  . Eye surgery      Social History:  reports that she has been smoking Cigarettes.  She has been smoking about 1.00 pack per day. She does not have any smokeless tobacco history on file. She reports that she drinks about 1.2 ounces of alcohol per week. She reports that she does not use illicit drugs.  No Known Allergies  Family history of hypertension  Prior to Admission medications   Medication Sig Start Date End Date Taking? Authorizing Provider  ALPRAZolam Prudy Feeler) 1 MG tablet Take 1 mg by mouth at bedtime as needed. For anxiety   Yes Historical Provider, MD  FLUoxetine (PROZAC) 40 MG capsule Take 40 mg by mouth every morning.    Yes Historical Provider, MD  guaiFENesin (MUCINEX) 600 MG 12 hr tablet Take 1,200 mg by mouth 2 (two) times daily as needed for congestion.   Yes Historical Provider, MD  guaiFENesin (  ROBITUSSIN) 100 MG/5ML liquid Take 200 mg by mouth 3 (three) times  daily as needed for cough.   Yes Historical Provider, MD  losartan-hydrochlorothiazide (HYZAAR) 100-25 MG per tablet Take 1 tablet by mouth 2 (two) times daily.   Yes Historical Provider, MD  omeprazole (PRILOSEC) 40 MG capsule Take 40 mg by mouth daily.    Yes Historical Provider, MD  oxyCODONE-acetaminophen (PERCOCET) 10-325 MG per tablet Take 1 tablet by mouth every 4 (four) hours as needed for pain.   Yes Historical Provider, MD  Pseudoeph-Doxylamine-DM-APAP (NYQUIL MULTI-SYMPTOM PO) Take 30 mLs by mouth every 6 (six) hours as needed (for cold).   Yes Historical Provider, MD    Physical Exam: Filed Vitals:   06/06/12 1836 06/06/12 1851  BP: 149/86   Pulse: 73   Temp: 98.4 F (36.9 C)   TempSrc: Oral   Resp: 22   SpO2: 92% 94%    Physical Exam  Constitutional: Appears well-developed and well-nourished. No distress.  HENT: Normocephalic. External right and left ear normal. Oropharynx is clear and moist.  Eyes: Conjunctivae and EOM are normal. PERRLA, no scleral icterus.  Neck: Normal ROM. Neck supple. No JVD. No tracheal deviation. No thyromegaly.  CVS: RRR, S1/S2 +, no murmurs, no gallops, no carotid bruit.  Pulmonary: Effort and breath sounds normal, no stridor, expiratory wheezing Abdominal: Soft. BS +,  no distension, tenderness, rebound or guarding.  Musculoskeletal: Normal range of motion. No edema and no tenderness.  Lymphadenopathy: No lymphadenopathy noted, cervical, inguinal. Neuro: Alert. Normal reflexes, muscle tone coordination. No cranial nerve deficit. Skin: Skin is warm and dry. No rash noted. Not diaphoretic. No erythema. No pallor.  Psychiatric: Normal mood and affect. Behavior, judgment, thought content normal.   Labs on Admission:  Basic Metabolic Panel:  Recent Labs Lab 06/06/12 1855  NA 121*  K 3.7  CL 83*  CO2 27  GLUCOSE 120*  BUN 6  CREATININE 0.97  CALCIUM 9.0   CBC:  Recent Labs Lab 06/06/12 1855  WBC 4.8  HGB 13.5  HCT 36.5  MCV  80.2  PLT 209    Radiological Exams on Admission: Dg Chest 2 View  06/06/2012   *RADIOLOGY REPORT*  Clinical Data: Cough, congestion and shortness of breath; history of smoking.  CHEST - 2 VIEW  Comparison: Chest radiograph performed 03/05/2005  Findings: The lungs are well-aerated and clear.  There is no evidence of focal opacification, pleural effusion or pneumothorax.  The heart is normal in size; calcification is noted in the aortic arch.  No acute osseous abnormalities are seen.  IMPRESSION: No acute cardiopulmonary process seen.   Original Report Authenticated By: Tonia Ghent, M.D.   EKG: Normal sinus rhythm, no ST/T wave changes  Debbora Presto, MD  [facility] Pager [phone]  If 7PM-7AM, please contact night-coverage [email or web address] Password [facility] 06/06/2012, 10:28 PM

## 2012-06-07 DIAGNOSIS — I10 Essential (primary) hypertension: Secondary | ICD-10-CM

## 2012-06-07 DIAGNOSIS — E871 Hypo-osmolality and hyponatremia: Secondary | ICD-10-CM

## 2012-06-07 LAB — TSH: TSH: 0.424 u[IU]/mL (ref 0.350–4.500)

## 2012-06-07 LAB — BASIC METABOLIC PANEL
CO2: 24 mEq/L (ref 19–32)
Glucose, Bld: 138 mg/dL — ABNORMAL HIGH (ref 70–99)
Potassium: 3.1 mEq/L — ABNORMAL LOW (ref 3.5–5.1)
Sodium: 123 mEq/L — ABNORMAL LOW (ref 135–145)

## 2012-06-07 LAB — HIV ANTIBODY (ROUTINE TESTING W REFLEX): HIV: NONREACTIVE

## 2012-06-07 LAB — STREP PNEUMONIAE URINARY ANTIGEN: Strep Pneumo Urinary Antigen: NEGATIVE

## 2012-06-07 LAB — CBC
Hemoglobin: 11.9 g/dL — ABNORMAL LOW (ref 12.0–15.0)
MCH: 29.8 pg (ref 26.0–34.0)
MCV: 80.8 fL (ref 78.0–100.0)
RBC: 4 MIL/uL (ref 3.87–5.11)

## 2012-06-07 MED ORDER — METHYLPREDNISOLONE SODIUM SUCC 40 MG IJ SOLR
40.0000 mg | Freq: Four times a day (QID) | INTRAMUSCULAR | Status: AC
  Administered 2012-06-07 – 2012-06-08 (×6): 40 mg via INTRAVENOUS
  Filled 2012-06-07 (×8): qty 1

## 2012-06-07 MED ORDER — LEVOFLOXACIN IN D5W 750 MG/150ML IV SOLN
750.0000 mg | INTRAVENOUS | Status: DC
  Filled 2012-06-07: qty 150

## 2012-06-07 MED ORDER — ACETAMINOPHEN 325 MG PO TABS: 650.0000 mg | ORAL_TABLET | Freq: Four times a day (QID) | ORAL | Status: DC | PRN

## 2012-06-07 MED ORDER — AMLODIPINE BESYLATE 10 MG PO TABS
10.0000 mg | ORAL_TABLET | Freq: Every day | ORAL | Status: DC
  Administered 2012-06-07 – 2012-06-09 (×3): 10 mg via ORAL
  Filled 2012-06-07 (×3): qty 1

## 2012-06-07 MED ORDER — ALBUTEROL SULFATE (5 MG/ML) 0.5% IN NEBU
2.5000 mg | INHALATION_SOLUTION | Freq: Three times a day (TID) | RESPIRATORY_TRACT | Status: DC
  Administered 2012-06-08 – 2012-06-09 (×4): 2.5 mg via RESPIRATORY_TRACT
  Filled 2012-06-07 (×4): qty 0.5

## 2012-06-07 MED ORDER — NICOTINE 21 MG/24HR TD PT24
21.0000 mg | MEDICATED_PATCH | Freq: Every day | TRANSDERMAL | Status: DC
  Administered 2012-06-07 – 2012-06-09 (×3): 21 mg via TRANSDERMAL
  Filled 2012-06-07 (×3): qty 1

## 2012-06-07 MED ORDER — ACETAMINOPHEN 325 MG PO TABS
ORAL_TABLET | ORAL | Status: AC
  Administered 2012-06-07: 650 mg
  Filled 2012-06-07: qty 2

## 2012-06-07 MED ORDER — IPRATROPIUM BROMIDE 0.02 % IN SOLN
0.5000 mg | Freq: Three times a day (TID) | RESPIRATORY_TRACT | Status: DC
  Administered 2012-06-08 – 2012-06-09 (×4): 0.5 mg via RESPIRATORY_TRACT
  Filled 2012-06-07 (×4): qty 2.5

## 2012-06-07 MED ORDER — IPRATROPIUM BROMIDE 0.02 % IN SOLN
0.5000 mg | RESPIRATORY_TRACT | Status: DC
  Administered 2012-06-07 (×3): 0.5 mg via RESPIRATORY_TRACT
  Filled 2012-06-07 (×3): qty 2.5

## 2012-06-07 MED ORDER — POTASSIUM CHLORIDE CRYS ER 20 MEQ PO TBCR
40.0000 meq | EXTENDED_RELEASE_TABLET | Freq: Once | ORAL | Status: AC
  Administered 2012-06-07: 40 meq via ORAL
  Filled 2012-06-07: qty 2

## 2012-06-07 MED ORDER — SALINE SPRAY 0.65 % NA SOLN
1.0000 | NASAL | Status: DC | PRN
  Administered 2012-06-07: 1 via NASAL
  Filled 2012-06-07: qty 44

## 2012-06-07 MED ORDER — ALBUTEROL SULFATE (5 MG/ML) 0.5% IN NEBU
2.5000 mg | INHALATION_SOLUTION | RESPIRATORY_TRACT | Status: DC
  Administered 2012-06-07 (×3): 2.5 mg via RESPIRATORY_TRACT
  Filled 2012-06-07 (×3): qty 0.5

## 2012-06-07 NOTE — Progress Notes (Signed)
[facility] PROGRESS NOTE  Allison Vaughan ZOX:[id number] DOB: Apr 27, 1945 DOA: 06/06/2012 PCP: Coralee Rud, PA-C  Assessment/Plan: Active Problems:   * No active hospital problems. *   1. COPD exacerbation: Patient presented with progressive wheeze and SOB over 1 week, associated with cough, productive of yellow phlegm, without chest pain or fever.  CXR was devoid of acute findings and wcc was normal. Managing with bronchodilator nebulizers, oxygen supplementation, steroids, mucolytics and levaquin, now day #2. Already, she feels better. Pulmonary follow up on discharge, would be beneficial.  2. Hyponatremia: Serum sodium on presentation, was 121. Etiology is likely HCTZ therapy, although SIADH is a possibility. Managing with ivi NS. Will check TSH, and serum/urine osmolalities. Following Lytes. HCTZ has been discontinued.  3. HTN: BP is sub-optimally controlled at this time. Will continue ARB and add Norvasc.  4. Hypokalemia: Secondary to HCTZ, which has now been discontinued. Repleting as indicated.    Code Status: Full Code.  Family Communication:  Disposition Plan: To be determined.    Brief narrative: 67 year old female with HTN, anxiety, chronic pain, COPD who presents to [facility] long emergency department with progressively worsening shortness of breath that she initially noted several days prior to admission, initially present with exertion only but now present at rest as well, associated with productive cough of clear to yellow sputum, wheezing, subjective fevers and chills, generalized fatigue. Patient reports similar events in the past that were associated with COPD flare. Patient explains she does not have a pulmonologist, she's not using any specific inhalers or nebulizers at home. Patient denies abdominal or urinary concerns. Admitted for further management.    Consultants:  N/A.  Procedures:  CXR  Antibiotics:  Levaquin 06/06/12>>>  HPI/Subjective: Feels  better this AM.   Objective: Vital signs in last 24 hours: Temp:  [97.7 F (36.5 C)-98.4 F (36.9 C)] 97.7 F (36.5 C) (05/25 0424) Pulse Rate:  [73-97] 89 (05/25 0424) Resp:  [20-22] 21 (05/25 0424) BP: (115-154)/(73-86) 146/74 mmHg (05/25 0424) SpO2:  [92 %-100 %] 95 % (05/25 0424) Weight:  [61.689 kg (136 lb)] 61.689 kg (136 lb) (05/25 0000) Weight change:  Last BM Date: 06/06/12  Intake/Output from previous day: 05/24 0701 - 05/25 0700 In: 437.5 [I.V.:437.5] Out: 500 [Urine:500]     Physical Exam: General: Comfortable, alert, communicative, fully oriented, not short of breath at rest.  HEENT:  No clinical pallor, no jaundice, no conjunctival injection or discharge. NECK:  Supple, JVP not seen, no carotid bruits, no palpable lymphadenopathy, no palpable goiter. CHEST:  Bilateral expiratory wheeze, no crackles. Marland Kitchen HEART:  Sounds 1 and 2 heard, normal, regular, no murmurs. ABDOMEN:  Full, soft, non-tender, no palpable organomegaly, no palpable masses, normal bowel sounds. GENITALIA:  Not examined. LOWER EXTREMITIES:  No pitting edema, palpable peripheral pulses. MUSCULOSKELETAL SYSTEM:  Generalized osteoarthritic changes, otherwise, normal. CENTRAL NERVOUS SYSTEM:  No focal neurologic deficit on gross examination.  Lab Results:  Recent Labs  06/06/12 1855 06/07/12 0625  WBC 4.8 3.1*  HGB 13.5 11.9*  HCT 36.5 32.3*  PLT 209 171    Recent Labs  06/06/12 1855  NA 121*  K 3.7  CL 83*  CO2 27  GLUCOSE 120*  BUN 6  CREATININE 0.97  CALCIUM 9.0   No results found for this or any previous visit (from the past 240 hour(s)).   Studies/Results: Dg Chest 2 View  06/06/2012   *RADIOLOGY REPORT*  Clinical Data: Cough, congestion and shortness of breath; history of smoking.  CHEST -  2 VIEW  Comparison: Chest radiograph performed 03/05/2005  Findings: The lungs are well-aerated and clear.  There is no evidence of focal opacification, pleural effusion or pneumothorax.   The heart is normal in size; calcification is noted in the aortic arch.  No acute osseous abnormalities are seen.  IMPRESSION: No acute cardiopulmonary process seen.   Original Report Authenticated By: Tonia Ghent, M.D.    Medications: Scheduled Meds: . albuterol  2.5 mg Nebulization QID  . enoxaparin (LOVENOX) injection  30 mg Subcutaneous QHS  . FLUoxetine  40 mg Oral q morning - 10a  . losartan  100 mg Oral BID   And  . hydrochlorothiazide  25 mg Oral BID  . [START ON 06/08/2012] levofloxacin (LEVAQUIN) IV  750 mg Intravenous Q48H  . methylPREDNISolone (SOLU-MEDROL) injection  80 mg Intravenous Q6H  . pantoprazole  40 mg Oral Daily   Continuous Infusions: . sodium chloride 75 mL/hr at 06/07/12 0010   PRN Meds:.albuterol, ALPRAZolam, guaiFENesin, guaiFENesin, ondansetron (ZOFRAN) IV, oxyCODONE    LOS: 1 day   Rakesh Dutko,CHRISTOPHER  [facility] Pager [phone]. If 8PM-8AM, please contact night-coverage at [email or web address], password [email or web address] 06/07/2012, 7:24 AM  LOS: 1 day

## 2012-06-07 NOTE — Progress Notes (Signed)
PHARMACY - ANTIBIOTIC ADJUSTMENT  Patient ordered Levaquin 750mg  IV q 24h x 5 days for pneumonia.  MD requested pharmacy adjust antibiotic dose/regimen based on renal function, if necessary.  Scr = 0.97 with CrCl = ~ 45 ml/min  ASSESMENT:  CrCl < 50 ml/min, which warrants adjustment of Levaquin dose  PLAN:  Change Levaquin to 750mg  IV q48h x 5 days of therapy   Terrilee Files, PharmD 06/07/12

## 2012-06-08 LAB — CBC
HCT: 34.6 % — ABNORMAL LOW (ref 36.0–46.0)
MCV: 81.4 fL (ref 78.0–100.0)
Platelets: 235 10*3/uL (ref 150–400)
RBC: 4.25 MIL/uL (ref 3.87–5.11)
WBC: 16.6 10*3/uL — ABNORMAL HIGH (ref 4.0–10.5)

## 2012-06-08 LAB — BASIC METABOLIC PANEL
CO2: 25 mEq/L (ref 19–32)
Chloride: 86 mEq/L — ABNORMAL LOW (ref 96–112)
Creatinine, Ser: 0.8 mg/dL (ref 0.50–1.10)

## 2012-06-08 MED ORDER — PREDNISONE 20 MG PO TABS
40.0000 mg | ORAL_TABLET | Freq: Every day | ORAL | Status: DC
  Administered 2012-06-09: 40 mg via ORAL
  Filled 2012-06-08 (×2): qty 2

## 2012-06-08 MED ORDER — LEVOFLOXACIN 750 MG PO TABS
750.0000 mg | ORAL_TABLET | Freq: Every day | ORAL | Status: DC
  Administered 2012-06-08 – 2012-06-09 (×2): 750 mg via ORAL
  Filled 2012-06-08 (×2): qty 1

## 2012-06-08 MED ORDER — LEVOFLOXACIN IN D5W 750 MG/150ML IV SOLN
750.0000 mg | INTRAVENOUS | Status: DC
  Filled 2012-06-08: qty 150

## 2012-06-08 MED ORDER — BENZONATATE 100 MG PO CAPS
200.0000 mg | ORAL_CAPSULE | Freq: Three times a day (TID) | ORAL | Status: DC
  Administered 2012-06-08 – 2012-06-09 (×2): 200 mg via ORAL
  Filled 2012-06-08 (×4): qty 2

## 2012-06-08 MED ORDER — SODIUM CHLORIDE 0.9 % IV SOLN
INTRAVENOUS | Status: DC
  Administered 2012-06-08: 50 mL/h via INTRAVENOUS

## 2012-06-08 MED ORDER — ZOLPIDEM TARTRATE 5 MG PO TABS
5.0000 mg | ORAL_TABLET | Freq: Once | ORAL | Status: AC
  Administered 2012-06-08: 5 mg via ORAL
  Filled 2012-06-08: qty 1

## 2012-06-08 NOTE — Discharge Summary (Signed)
Physician Discharge Summary  Allison Vaughan AOZ:[id number] DOB: November 28, 1945 DOA: 06/06/2012  PCP: Coralee Rud, PA-C  Admit date: 06/06/2012 Discharge date: 06/09/2012  Time spent: 40 minutes  Recommendations for Outpatient Follow-up:  1. Follow up with primary MD. 2. Follow up with Dr Sandrea Hughs, Pulmonologist on 06/23/12 at 1:30 PM.   Discharge Diagnoses:  Active Problems:   * No active hospital problems. *   Discharge Condition: Satisfactory.   Diet recommendation: Heart-Healthy.   Filed Weights   06/07/12 0000  Weight: 61.689 kg (136 lb)    History of present illness:  67 year old female with HTN, anxiety, chronic pain, COPD who presents to [facility] long emergency department with progressively worsening shortness of breath that she initially noted several days prior to admission, initially present with exertion only but now present at rest as well, associated with productive cough of clear to yellow sputum, wheezing, subjective fevers and chills, generalized fatigue. Patient reports similar events in the past that were associated with COPD flare. Patient explains she does not have a pulmonologist, she's not using any specific inhalers or nebulizers at home. Patient denies abdominal or urinary concerns. Admitted for further management.    Hospital Course:  1. COPD exacerbation: Patient presented with progressive wheeze and SOB over 1 week, associated with cough, productive of yellow phlegm, without chest pain or fever. CXR was devoid of acute findings and wcc was normal. Managed with bronchodilator nebulizers, oxygen supplementation, steroids, mucolytics and Levaquin. Clinical improvement was steady and satisfactory. Transitioned to oral steroid taper from 06/09/12. Pulmonary follow up on discharge, has been arranged, and patient will comp[lete antibiotic treatment on 06/12/12. Discharged on Spiriva and prn albuterol MDI.  2. Hyponatremia: Serum sodium on presentation, was 121.  Etiology is likely HCTZ therapy. Serum osmolality was 254, urine osmolality 161, effectively excluding SIADH. TSH was normal at 0.427. HCTZ has been therefore, discontinued. Managed with ivi NS, with some improvement.  3. HTN: BP was initially sub-optimally controlled on ARB alone, so Norvasc was added, with satisfactory control.  4. Hypokalemia: Secondary to HCTZ, which has now been discontinued. Repleted as indicated/Resolved.   Procedures:  See Below.   Consultations:  N/A  Discharge Exam: Filed Vitals:   06/09/12 0500 06/09/12 0559 06/09/12 0816 06/09/12 0912  BP:  175/83    Pulse:  89    Temp: 98.1 F (36.7 C) 98.1 F (36.7 C) 97.7 F (36.5 C)   TempSrc:  Oral Oral   Resp:  19    Height:      Weight:      SpO2:  100%  98%    General: Comfortable, alert, communicative, fully oriented, not short of breath at rest.  HEENT: No clinical pallor, no jaundice, no conjunctival injection or discharge.  NECK: Supple, JVP not seen, no carotid bruits, no palpable lymphadenopathy, no palpable goiter.  CHEST: Clinically clear, no wheezes, no crackles.  HEART: Sounds 1 and 2 heard, normal, regular, no murmurs.  ABDOMEN: Full, soft, non-tender, no palpable organomegaly, no palpable masses, normal bowel sounds.  GENITALIA: Not examined.  LOWER EXTREMITIES: No pitting edema, palpable peripheral pulses.  MUSCULOSKELETAL SYSTEM: Generalized osteoarthritic changes, otherwise, normal.  CENTRAL NERVOUS SYSTEM: No focal neurologic deficit on gross examination.  Discharge Instructions      Discharge Orders   Future Appointments Provider Department Dept Phone   06/23/2012 1:45 PM Nyoka Cowden, MD [facility] [phone]   Future Orders Complete By Expires     Diet - low sodium heart healthy  As directed     Increase activity slowly  As directed         Medication List    STOP taking these medications       guaiFENesin 100 MG/5ML liquid  Commonly known as:  ROBITUSSIN      losartan-hydrochlorothiazide 100-25 MG per tablet  Commonly known as:  HYZAAR     NYQUIL MULTI-SYMPTOM PO      TAKE these medications       albuterol 108 (90 BASE) MCG/ACT inhaler  Commonly known as:  PROVENTIL HFA;VENTOLIN HFA  Inhale 2 puffs into the lungs every 6 (six) hours as needed for wheezing.     ALPRAZolam 1 MG tablet  Commonly known as:  XANAX  Take 1 mg by mouth at bedtime as needed. For anxiety     amLODipine 10 MG tablet  Commonly known as:  NORVASC  Take 1 tablet (10 mg total) by mouth daily.     benzonatate 200 MG capsule  Commonly known as:  TESSALON  Take 1 capsule (200 mg total) by mouth 3 (three) times daily.     FLUoxetine 40 MG capsule  Commonly known as:  PROZAC  Take 40 mg by mouth every morning.     guaiFENesin 600 MG 12 hr tablet  Commonly known as:  MUCINEX  Take 1,200 mg by mouth 2 (two) times daily as needed for congestion.     levofloxacin 750 MG tablet  Commonly known as:  LEVAQUIN  Take 1 tablet (750 mg total) by mouth daily.     losartan 100 MG tablet  Commonly known as:  COZAAR  Take 1 tablet (100 mg total) by mouth 2 (two) times daily.     omeprazole 40 MG capsule  Commonly known as:  PRILOSEC  Take 40 mg by mouth daily.     Oxycodone HCl 10 MG Tabs  Take 10 mg by mouth 3 (three) times daily as needed. For pain     predniSONE 10 MG tablet  Commonly known as:  DELTASONE  Take 40 mg daily for 3 days, then 30 mg daily for 3 days, then 20 mg daily for 3 days, then 10 mg daily for 3 days, then stop.     tiotropium 18 MCG inhalation capsule  Commonly known as:  SPIRIVA HANDIHALER  Place 1 capsule (18 mcg total) into inhaler and inhale daily.       No Known Allergies Follow-up Information   Schedule an appointment as soon as possible for a visit with DURAN,MICHAEL R, PA-C.   Contact information:   [facility]  [address] [place] [address] [phone]       Follow up with Sandrea Hughs, MD On  06/23/2012. (Please come be at the office by 1:30 PM and bring all your medications. )    Contact information:   [address] [place] [address] [phone]        The results of significant diagnostics from this hospitalization (including imaging, microbiology, ancillary and laboratory) are listed below for reference.    Significant Diagnostic Studies: Dg Chest 2 View  06/06/2012   *RADIOLOGY REPORT*  Clinical Data: Cough, congestion and shortness of breath; history of smoking.  CHEST - 2 VIEW  Comparison: Chest radiograph performed 03/05/2005  Findings: The lungs are well-aerated and clear.  There is no evidence of focal opacification, pleural effusion or pneumothorax.  The heart is normal in size; calcification is noted in the aortic arch.  No acute  osseous abnormalities are seen.  IMPRESSION: No acute cardiopulmonary process seen.   Original Report Authenticated By: Tonia Ghent, M.D.    Microbiology: Recent Results (from the past 240 hour(s))  CULTURE, BLOOD (ROUTINE X 2)     Status: None   Collection Time    06/06/12 11:20 PM      Result Value Range Status   Specimen Description BLOOD R FOREARM   Final   Special Requests NONE BOTTLES DRAWN AEROBIC AND ANAEROBIC 5CC   Final   Culture  Setup Time 06/07/2012 12:41   Final   Culture     Final   Value:        BLOOD CULTURE RECEIVED NO GROWTH TO DATE CULTURE WILL BE HELD FOR 5 DAYS BEFORE ISSUING A FINAL NEGATIVE REPORT   Report Status PENDING   Incomplete  CULTURE, BLOOD (ROUTINE X 2)     Status: None   Collection Time    06/06/12 11:20 PM      Result Value Range Status   Specimen Description BLOOD RIGHT ANTECUBITAL   Final   Special Requests NONE BOTTLES DRAWN AEROBIC AND ANAEROBIC 5CC   Final   Culture  Setup Time 06/07/2012 12:41   Final   Culture     Final   Value:        BLOOD CULTURE RECEIVED NO GROWTH TO DATE CULTURE WILL BE HELD FOR 5 DAYS BEFORE ISSUING A FINAL NEGATIVE REPORT   Report Status PENDING    Incomplete     Labs: Basic Metabolic Panel:  Recent Labs Lab 06/06/12 1855 06/07/12 0625 06/08/12 0453 06/09/12 0441  NA 121* 123* 123* 124*  K 3.7 3.1* 3.8 4.0  CL 83* 82* 86* 89*  CO2 27 24 25 28   GLUCOSE 120* 138* 147* 142*  BUN 6 9 10 12   CREATININE 0.97 0.90 0.80 0.77  CALCIUM 9.0 9.0 9.4 9.0   Liver Function Tests: No results found for this basename: AST, ALT, ALKPHOS, BILITOT, PROT, ALBUMIN,  in the last 168 hours No results found for this basename: LIPASE, AMYLASE,  in the last 168 hours No results found for this basename: AMMONIA,  in the last 168 hours CBC:  Recent Labs Lab 06/06/12 1855 06/07/12 0625 06/08/12 0453 06/09/12 0441  WBC 4.8 3.1* 16.6* 12.1*  HGB 13.5 11.9* 12.9 12.1  HCT 36.5 32.3* 34.6* 33.2*  MCV 80.2 80.8 81.4 82.6  PLT 209 171 235 228   Cardiac Enzymes: No results found for this basename: CKTOTAL, CKMB, CKMBINDEX, TROPONINI,  in the last 168 hours BNP: BNP (last 3 results) No results found for this basename: PROBNP,  in the last 8760 hours CBG: No results found for this basename: GLUCAP,  in the last 168 hours     Signed:  Zeric Baranowski,CHRISTOPHER  [facility] 06/09/2012, 11:02 AM

## 2012-06-08 NOTE — Progress Notes (Signed)
Renal Adjustment of Antibiotics Protocol  32 yof with h/o COPD presented 5/24 with SOB, productive cough, wheezing, subjective fevers and chills. Md started Levaquin for COPD exacerbation.   Levaquin adjusted 5/25 but today's CG CrCl is 55 ml/min. Pt is afebrile, WBC up to 16.6K. Will change Levaquin to 750 mg PO q24h x 3 more doses (total of 5 days per MD original order)  Geoffry Paradise, PharmD, BCPS Pager: [phone] 1:38 PM Pharmacy #: [id number]

## 2012-06-08 NOTE — Progress Notes (Signed)
[facility] PROGRESS NOTE  Allison Vaughan XBJ:[id number] DOB: [birth year] DOA: 06/06/2012 PCP: Coralee Rud, PA-C  Assessment/Plan: Active Problems:   * No active hospital problems. *   1. COPD exacerbation: Patient presented with progressive wheeze and SOB over 1 week, associated with cough, productive of yellow phlegm, without chest pain or fever.  CXR was devoid of acute findings and wcc was normal. Managing with bronchodilator nebulizers, oxygen supplementation, steroids, mucolytics and levaquin, now day #3. Clinical improvement has been steady and satisfactory. Will commence oral steroid taper from 06/09/12. Pulmonary follow up on discharge, would be beneficial. Will comp[lete antibiotic treatment on 06/12/12.  2. Hyponatremia: Serum sodium on presentation, was 121. Etiology is likely HCTZ therapy. Serum osmolality was 254, urine osmolality 161, effectively excluding SIADH. TSH was normal at 0.427. HCTZ has been discontinued. Managing with ivi NS. 3. HTN: BP was initially sub-optimally controlled on ARB alone, so Norvasc was added, with satisfactory control.  4. Hypokalemia: Secondary to HCTZ, which has now been discontinued. Repleted as indicated/resolved.    Code Status: Full Code.  Family Communication:  Disposition Plan: To be determined. Aiming discharge on 06/09/12.    Brief narrative: 67 year old female with HTN, anxiety, chronic pain, COPD who presents to [facility] long emergency department with progressively worsening shortness of breath that she initially noted several days prior to admission, initially present with exertion only but now present at rest as well, associated with productive cough of clear to yellow sputum, wheezing, subjective fevers and chills, generalized fatigue. Patient reports similar events in the past that were associated with COPD flare. Patient explains she does not have a pulmonologist, she's not using any specific inhalers or nebulizers at home.  Patient denies abdominal or urinary concerns. Admitted for further management.    Consultants:  N/A.  Procedures:  CXR  Antibiotics:  Levaquin 06/06/12>>>  HPI/Subjective: Feels much better.   Objective: Vital signs in last 24 hours: Temp:  [97.8 F (36.6 C)-98.3 F (36.8 C)] 98.3 F (36.8 C) (05/26 1430) Pulse Rate:  [88-106] 88 (05/26 1430) Resp:  [18-20] 18 (05/26 1430) BP: (139-168)/(69-92) 139/69 mmHg (05/26 1430) SpO2:  [93 %-100 %] 96 % (05/26 1443) FiO2 (%):  [21 %] 21 % (05/26 1443) Weight change:  Last BM Date: 06/06/12  Intake/Output from previous day: 05/25 0701 - 05/26 0700 In: 1320 [P.O.:1320] Out: 1550 [Urine:1550] Total I/O In: 600 [P.O.:600] Out: -    Physical Exam: General: Comfortable, alert, communicative, fully oriented, not short of breath at rest.  HEENT:  No clinical pallor, no jaundice, no conjunctival injection or discharge. NECK:  Supple, JVP not seen, no carotid bruits, no palpable lymphadenopathy, no palpable goiter. CHEST:  Clinically clear, no wheezes, no crackles. HEART:  Sounds 1 and 2 heard, normal, regular, no murmurs. ABDOMEN:  Full, soft, non-tender, no palpable organomegaly, no palpable masses, normal bowel sounds. GENITALIA:  Not examined. LOWER EXTREMITIES:  No pitting edema, palpable peripheral pulses. MUSCULOSKELETAL SYSTEM:  Generalized osteoarthritic changes, otherwise, normal. CENTRAL NERVOUS SYSTEM:  No focal neurologic deficit on gross examination.  Lab Results:  Recent Labs  06/07/12 0625 06/08/12 0453  WBC 3.1* 16.6*  HGB 11.9* 12.9  HCT 32.3* 34.6*  PLT 171 235    Recent Labs  06/07/12 0625 06/08/12 0453  NA 123* 123*  K 3.1* 3.8  CL 82* 86*  CO2 24 25  GLUCOSE 138* 147*  BUN 9 10  CREATININE 0.90 0.80  CALCIUM 9.0 9.4   Recent Results (from the past 240 hour(s))  CULTURE, BLOOD (ROUTINE X 2)     Status: None   Collection Time    06/06/12 11:20 PM      Result Value Range Status    Specimen Description BLOOD R FOREARM   Final   Special Requests NONE BOTTLES DRAWN AEROBIC AND ANAEROBIC 5CC   Final   Culture  Setup Time 06/07/2012 12:41   Final   Culture     Final   Value:        BLOOD CULTURE RECEIVED NO GROWTH TO DATE CULTURE WILL BE HELD FOR 5 DAYS BEFORE ISSUING A FINAL NEGATIVE REPORT   Report Status PENDING   Incomplete  CULTURE, BLOOD (ROUTINE X 2)     Status: None   Collection Time    06/06/12 11:20 PM      Result Value Range Status   Specimen Description BLOOD RIGHT ANTECUBITAL   Final   Special Requests NONE BOTTLES DRAWN AEROBIC AND ANAEROBIC 5CC   Final   Culture  Setup Time 06/07/2012 12:41   Final   Culture     Final   Value:        BLOOD CULTURE RECEIVED NO GROWTH TO DATE CULTURE WILL BE HELD FOR 5 DAYS BEFORE ISSUING A FINAL NEGATIVE REPORT   Report Status PENDING   Incomplete     Studies/Results: Dg Chest 2 View  06/06/2012   *RADIOLOGY REPORT*  Clinical Data: Cough, congestion and shortness of breath; history of smoking.  CHEST - 2 VIEW  Comparison: Chest radiograph performed 03/05/2005  Findings: The lungs are well-aerated and clear.  There is no evidence of focal opacification, pleural effusion or pneumothorax.  The heart is normal in size; calcification is noted in the aortic arch.  No acute osseous abnormalities are seen.  IMPRESSION: No acute cardiopulmonary process seen.   Original Report Authenticated By: Tonia Ghent, M.D.    Medications: Scheduled Meds: . ipratropium  0.5 mg Nebulization TID   And  . albuterol  2.5 mg Nebulization TID  . amLODipine  10 mg Oral Daily  . enoxaparin (LOVENOX) injection  30 mg Subcutaneous QHS  . FLUoxetine  40 mg Oral q morning - 10a  . levofloxacin  750 mg Oral Daily  . losartan  100 mg Oral BID  . methylPREDNISolone (SOLU-MEDROL) injection  40 mg Intravenous Q6H  . nicotine  21 mg Transdermal Daily  . pantoprazole  40 mg Oral Daily  . [START ON 06/09/2012] predniSONE  40 mg Oral Q breakfast    Continuous Infusions: . sodium chloride     PRN Meds:.acetaminophen, albuterol, ALPRAZolam, guaiFENesin, ondansetron (ZOFRAN) IV, oxyCODONE, sodium chloride    LOS: 2 days   Baraa Tubbs,CHRISTOPHER  [facility] Pager [phone]. If 8PM-8AM, please contact night-coverage at [email or web address], password [facility] 06/08/2012, 6:02 PM  LOS: 2 days

## 2012-06-09 LAB — CBC
HCT: 33.2 % — ABNORMAL LOW (ref 36.0–46.0)
Hemoglobin: 12.1 g/dL (ref 12.0–15.0)
MCH: 30.1 pg (ref 26.0–34.0)
MCV: 82.6 fL (ref 78.0–100.0)
Platelets: 228 10*3/uL (ref 150–400)
RBC: 4.02 MIL/uL (ref 3.87–5.11)

## 2012-06-09 LAB — BASIC METABOLIC PANEL
CO2: 28 mEq/L (ref 19–32)
Calcium: 9 mg/dL (ref 8.4–10.5)
Chloride: 89 mEq/L — ABNORMAL LOW (ref 96–112)
Glucose, Bld: 142 mg/dL — ABNORMAL HIGH (ref 70–99)
Sodium: 124 mEq/L — ABNORMAL LOW (ref 135–145)

## 2012-06-09 LAB — LEGIONELLA ANTIGEN, URINE

## 2012-06-09 MED ORDER — PREDNISONE 10 MG PO TABS: ORAL_TABLET | ORAL | Status: DC

## 2012-06-09 MED ORDER — AMLODIPINE BESYLATE 10 MG PO TABS: 10.0000 mg | ORAL_TABLET | Freq: Every day | ORAL | Status: DC

## 2012-06-09 MED ORDER — ALBUTEROL SULFATE HFA 108 (90 BASE) MCG/ACT IN AERS: 2.0000 | INHALATION_SPRAY | Freq: Four times a day (QID) | RESPIRATORY_TRACT | Status: AC | PRN

## 2012-06-09 MED ORDER — LOSARTAN POTASSIUM 100 MG PO TABS: 100.0000 mg | ORAL_TABLET | Freq: Two times a day (BID) | ORAL | Status: DC

## 2012-06-09 MED ORDER — BENZONATATE 200 MG PO CAPS: 200.0000 mg | ORAL_CAPSULE | Freq: Three times a day (TID) | ORAL | Status: DC

## 2012-06-09 MED ORDER — TIOTROPIUM BROMIDE MONOHYDRATE 18 MCG IN CAPS: 18.0000 ug | ORAL_CAPSULE | Freq: Every day | RESPIRATORY_TRACT | Status: DC

## 2012-06-09 MED ORDER — LEVOFLOXACIN 750 MG PO TABS: 750.0000 mg | ORAL_TABLET | Freq: Every day | ORAL | Status: DC

## 2012-06-13 LAB — CULTURE, BLOOD (ROUTINE X 2): Culture: NO GROWTH

## 2012-06-23 ENCOUNTER — Inpatient Hospital Stay: Payer: Medicare Other | Admitting: Internal Medicine

## 2012-06-25 ENCOUNTER — Emergency Department (HOSPITAL_COMMUNITY): Payer: Medicare Other

## 2012-06-25 ENCOUNTER — Encounter (HOSPITAL_COMMUNITY): Payer: Self-pay | Admitting: *Deleted

## 2012-06-25 ENCOUNTER — Observation Stay (HOSPITAL_COMMUNITY)
Admission: EM | Admit: 2012-06-25 | Discharge: 2012-06-27 | Disposition: A | Discharge status: Home / Self Care | Payer: Medicare Other | Attending: Internal Medicine | Admitting: Internal Medicine

## 2012-06-25 DIAGNOSIS — E871 Hypo-osmolality and hyponatremia: Secondary | ICD-10-CM | POA: Insufficient documentation

## 2012-06-25 DIAGNOSIS — F29 Unspecified psychosis not due to a substance or known physiological condition: Secondary | ICD-10-CM | POA: Insufficient documentation

## 2012-06-25 DIAGNOSIS — J449 Chronic obstructive pulmonary disease, unspecified: Secondary | ICD-10-CM | POA: Diagnosis present

## 2012-06-25 DIAGNOSIS — R509 Fever, unspecified: Secondary | ICD-10-CM | POA: Insufficient documentation

## 2012-06-25 DIAGNOSIS — R0902 Hypoxemia: Secondary | ICD-10-CM

## 2012-06-25 DIAGNOSIS — I10 Essential (primary) hypertension: Secondary | ICD-10-CM | POA: Insufficient documentation

## 2012-06-25 DIAGNOSIS — E7800 Pure hypercholesterolemia, unspecified: Secondary | ICD-10-CM

## 2012-06-25 DIAGNOSIS — E860 Dehydration: Secondary | ICD-10-CM | POA: Insufficient documentation

## 2012-06-25 DIAGNOSIS — Z79899 Other long term (current) drug therapy: Secondary | ICD-10-CM | POA: Insufficient documentation

## 2012-06-25 DIAGNOSIS — R531 Weakness: Secondary | ICD-10-CM

## 2012-06-25 DIAGNOSIS — J441 Chronic obstructive pulmonary disease with (acute) exacerbation: Secondary | ICD-10-CM | POA: Insufficient documentation

## 2012-06-25 DIAGNOSIS — N951 Menopausal and female climacteric states: Secondary | ICD-10-CM

## 2012-06-25 DIAGNOSIS — I6789 Other cerebrovascular disease: Secondary | ICD-10-CM | POA: Insufficient documentation

## 2012-06-25 DIAGNOSIS — R270 Ataxia, unspecified: Secondary | ICD-10-CM

## 2012-06-25 DIAGNOSIS — K219 Gastro-esophageal reflux disease without esophagitis: Secondary | ICD-10-CM

## 2012-06-25 DIAGNOSIS — R4182 Altered mental status, unspecified: Secondary | ICD-10-CM | POA: Insufficient documentation

## 2012-06-25 DIAGNOSIS — K589 Irritable bowel syndrome without diarrhea: Secondary | ICD-10-CM

## 2012-06-25 DIAGNOSIS — F411 Generalized anxiety disorder: Secondary | ICD-10-CM | POA: Insufficient documentation

## 2012-06-25 DIAGNOSIS — J9611 Chronic respiratory failure with hypoxia: Secondary | ICD-10-CM

## 2012-06-25 DIAGNOSIS — M1990 Unspecified osteoarthritis, unspecified site: Secondary | ICD-10-CM

## 2012-06-25 DIAGNOSIS — Z23 Encounter for immunization: Secondary | ICD-10-CM | POA: Insufficient documentation

## 2012-06-25 DIAGNOSIS — J9610 Chronic respiratory failure, unspecified whether with hypoxia or hypercapnia: Principal | ICD-10-CM | POA: Insufficient documentation

## 2012-06-25 DIAGNOSIS — R079 Chest pain, unspecified: Secondary | ICD-10-CM | POA: Insufficient documentation

## 2012-06-25 DIAGNOSIS — F3289 Other specified depressive episodes: Secondary | ICD-10-CM | POA: Insufficient documentation

## 2012-06-25 DIAGNOSIS — F17200 Nicotine dependence, unspecified, uncomplicated: Secondary | ICD-10-CM | POA: Diagnosis present

## 2012-06-25 DIAGNOSIS — F329 Major depressive disorder, single episode, unspecified: Secondary | ICD-10-CM

## 2012-06-25 LAB — URINALYSIS, ROUTINE W REFLEX MICROSCOPIC
Bilirubin Urine: NEGATIVE
Glucose, UA: NEGATIVE mg/dL
Ketones, ur: NEGATIVE mg/dL
Leukocytes, UA: NEGATIVE
Specific Gravity, Urine: 1.009 (ref 1.005–1.030)
pH: 6.5 (ref 5.0–8.0)

## 2012-06-25 LAB — COMPREHENSIVE METABOLIC PANEL
ALT: 9 U/L (ref 0–35)
AST: 15 U/L (ref 0–37)
Albumin: 3.2 g/dL — ABNORMAL LOW (ref 3.5–5.2)
Alkaline Phosphatase: 128 U/L — ABNORMAL HIGH (ref 39–117)
Calcium: 8.8 mg/dL (ref 8.4–10.5)
Potassium: 3.6 mEq/L (ref 3.5–5.1)
Sodium: 131 mEq/L — ABNORMAL LOW (ref 135–145)
Total Protein: 6.9 g/dL (ref 6.0–8.3)

## 2012-06-25 LAB — CBC
Hemoglobin: 11.2 g/dL — ABNORMAL LOW (ref 12.0–15.0)
MCHC: 34.8 g/dL (ref 30.0–36.0)
Platelets: 178 10*3/uL (ref 150–400)
RDW: 13.1 % (ref 11.5–15.5)

## 2012-06-25 LAB — PROTEIN, CSF: Total  Protein, CSF: 28 mg/dL (ref 15–45)

## 2012-06-25 MED ORDER — LIDOCAINE HCL 2 % IJ SOLN
INTRAMUSCULAR | Status: AC
  Filled 2012-06-25: qty 20

## 2012-06-25 MED ORDER — ACYCLOVIR SODIUM 50 MG/ML IV SOLN
10.0000 mg/kg | Freq: Once | INTRAVENOUS | Status: AC
  Administered 2012-06-26: 615 mg via INTRAVENOUS
  Filled 2012-06-25: qty 12.3

## 2012-06-25 MED ORDER — SODIUM CHLORIDE 0.9 % IV BOLUS (SEPSIS)
1000.0000 mL | Freq: Once | INTRAVENOUS | Status: AC
  Administered 2012-06-25: 1000 mL via INTRAVENOUS

## 2012-06-25 NOTE — ED Notes (Signed)
Pt presents to ed with c/o altered mental status and shortness of breath, per daughter pt has hx of hyponatremia and she sts pt is having same symptoms now as she did then. Pt appears drowsy, has slurred speech, and sts feels very tired.

## 2012-06-25 NOTE — Progress Notes (Signed)
   CARE MANAGEMENT ED NOTE 06/25/2012  Patient:  HAPPY, BEGEMAN   Account Number:  [id number]  Date Initiated:  06/25/2012  Documentation initiated by:  Radford Pax  Subjective/Objective Assessment:   Patient presents to ED with shortness of breath.     Subjective/Objective Assessment Detail:     Action/Plan:   Action/Plan Detail:   Anticipated DC Date:       Status Recommendation to Physician:   Result of Recommendation:    Other ED Services  Consult Working Plan    DC Planning Services  CM consult  Medication Assistance  Other    Choice offered to / List presented to:            Status of service:  Completed, signed off  ED Comments:   ED Comments Detail:  [facility] consulted to see patiet regarding help obtaining medications.  EDCM spoke to patient who confirmed that she has [employer].  Patient stated that she has problems paying for Spiriva and albuterol. [facility] provided patient with discount prescription card, needymeds.org website and [facility] medication list.  Patient thankful for services.  No further needs at this time.

## 2012-06-25 NOTE — ED Provider Notes (Signed)
History     CSN: [id number]  Arrival date & time 06/25/12  1704   First MD Initiated Contact with Patient 06/25/12 1748      Chief Complaint  Patient presents with  . Altered Mental Status  . Shortness of Breath    (Consider location/radiation/quality/duration/timing/severity/associated sxs/prior treatment) The history is provided by the patient.  GWYNETH FERNANDEZ is a 67 y.o. female hx of HTN, COPD here with SOB and weakness. She was recently admitted for COPD exacerbation hyponatremia. She was discharged 2 weeks ago and finished a course of prednisone several days ago. As per daughter she's been more drowsy recently and has been sleeping constantly. Has some low-grade temperatures at home but no actual fever. She has been having a stable cough. As per daughter she sometimes has some visual hallucinations (see family in space ships) but not suicidal or homicidal or depressed. She also have been falling more often and lost her balance. Has decreased PO intake but no vomiting. Had one episode of diarrhea that improved with imodium and no abdominal pain.   Level V caveat- AMS    Past Medical History  Diagnosis Date  . Hypertension   . Chronic pain   . Anxiety   . COPD (chronic obstructive pulmonary disease)     Past Surgical History  Procedure Laterality Date  . Eye surgery      History reviewed. No pertinent family history.  History  Substance Use Topics  . Smoking status: Current Every Day Smoker -- 1.00 packs/day    Types: Cigarettes  . Smokeless tobacco: Not on file  . Alcohol Use: 1.2 oz/week    2 Cans of beer per week     Comment: per week    OB History   Grav Para Term Preterm Abortions TAB SAB Ect Mult Living                  Review of Systems  Respiratory: Positive for shortness of breath.   Neurological: Positive for weakness.  Psychiatric/Behavioral: Positive for altered mental status.  All other systems reviewed and are negative.    Allergies   Review of patient's allergies indicates no known allergies.  Home Medications   Current Outpatient Rx  Name  Route  Sig  Dispense  Refill  . albuterol (PROVENTIL HFA;VENTOLIN HFA) 108 (90 BASE) MCG/ACT inhaler   Inhalation   Inhale 2 puffs into the lungs every 6 (six) hours as needed for wheezing.   1 Inhaler   2   . ALPRAZolam (XANAX) 1 MG tablet   Oral   Take 1 mg by mouth at bedtime as needed. For anxiety         . amLODipine (NORVASC) 10 MG tablet   Oral   Take 1 tablet (10 mg total) by mouth daily.   30 tablet   0   . FLUoxetine (PROZAC) 40 MG capsule   Oral   Take 40 mg by mouth every morning.          Marland Kitchen guaiFENesin (MUCINEX) 600 MG 12 hr tablet   Oral   Take 1,200 mg by mouth 2 (two) times daily as needed for congestion.         Marland Kitchen losartan (COZAAR) 100 MG tablet   Oral   Take 1 tablet (100 mg total) by mouth 2 (two) times daily.   60 tablet   0   . omeprazole (PRILOSEC) 40 MG capsule   Oral   Take 40 mg by mouth daily.          Marland Kitchen  Oxycodone HCl 10 MG TABS   Oral   Take 10 mg by mouth 3 (three) times daily as needed. For pain         . tiotropium (SPIRIVA HANDIHALER) 18 MCG inhalation capsule   Inhalation   Place 1 capsule (18 mcg total) into inhaler and inhale daily.   30 capsule   12     BP 127/54  Pulse 85  Temp(Src) 100.3 F (37.9 C)  Resp 16  Wt 136 lb (61.689 kg)  BMI 26.56 kg/m2  SpO2 93%  Physical Exam  Nursing note and vitals reviewed. Constitutional: She is oriented to person, place, and time.  Chronically ill, tired but answer questions appropriately   HENT:  Head: Normocephalic.  MM slightly dry   Eyes: Conjunctivae are normal. Pupils are equal, round, and reactive to light.  Neck: Normal range of motion. Neck supple.  Cardiovascular: Normal rate, regular rhythm and normal heart sounds.   Pulmonary/Chest: Effort normal.  Mild diffuse wheezing, no crackles   Abdominal: Soft. Bowel sounds are normal. She exhibits no  distension. There is no tenderness. There is no rebound.  Musculoskeletal: Normal range of motion.  Neurological: She is alert and oriented to person, place, and time.  4/5 strength throughout. CN intact. NL finger to nose. No pronator drift. Falls to both sides while walking but able to ambulate with assistance   Skin: Skin is warm and dry.  Psychiatric:  Unable     ED Course  LUMBAR PUNCTURE Date/Time: 06/25/2012 10:47 PM Performed by: Richardean Canal Authorized by: Richardean Canal Consent: Verbal consent obtained. written consent obtained. Risks and benefits: risks, benefits and alternatives were discussed Consent given by: patient Patient understanding: patient states understanding of the procedure being performed Patient consent: the patient's understanding of the procedure matches consent given Procedure consent: procedure consent matches procedure scheduled Relevant documents: relevant documents present and verified Test results: test results available and properly labeled Site marked: the operative site was marked Patient identity confirmed: verbally with patient and arm band Time out: Immediately prior to procedure a "time out" was called to verify the correct patient, procedure, equipment, support staff and site/side marked as required. Indications: evaluation for altered mental status and evaluation for infection Local anesthetic: lidocaine 2% without epinephrine Anesthetic total: 10 ml Preparation: Patient was prepped and draped in the usual sterile fashion. Lumbar space: L4-L5 interspace Patient's position: sitting Needle gauge: 22 Needle type: spinal needle - Quincke tip Needle length: 3.5 in Number of attempts: 4 Fluid appearance: clear Tubes of fluid: 4 Total volume: 6 ml Post-procedure: site cleaned and pressure dressing applied Patient tolerance: Patient tolerated the procedure well with no immediate complications.   (including critical care time)    Labs  Reviewed  CBC - Abnormal; Notable for the following:    RBC 3.66 (*)    Hemoglobin 11.2 (*)    HCT 32.2 (*)    All other components within normal limits  COMPREHENSIVE METABOLIC PANEL - Abnormal; Notable for the following:    Sodium 131 (*)    Chloride 93 (*)    Glucose, Bld 102 (*)    Albumin 3.2 (*)    Alkaline Phosphatase 128 (*)    GFR calc non Af Amer 62 (*)    GFR calc Af Amer 72 (*)    All other components within normal limits  GLUCOSE, CAPILLARY - Abnormal; Notable for the following:    Glucose-Capillary 105 (*)    All other components within normal  limits  CSF CULTURE  GRAM STAIN  URINALYSIS, ROUTINE W REFLEX MICROSCOPIC  CSF CELL COUNT WITH DIFFERENTIAL  CSF CELL COUNT WITH DIFFERENTIAL  GLUCOSE, CSF  PROTEIN, CSF  HERPES SIMPLEX VIRUS(HSV) DNA BY PCR   Dg Chest 2 View  06/25/2012   *RADIOLOGY REPORT*  Clinical Data: Altered mental status, shortness of breath and chest pain.  CHEST - 2 VIEW  Comparison: 06/06/2012  Findings: Heart is mildly enlarged.  No edema or infiltrate is seen.  No evidence of nodule or pleural fluid.  The bony thorax shows stable mild degenerative changes and minimal scoliosis of the thoracic spine.  IMPRESSION: Mild cardiomegaly.  No active disease.   Original Report Authenticated By: Irish Lack, M.D.   Ct Head Wo Contrast  06/25/2012   *RADIOLOGY REPORT*  Clinical Data: Altered mental status.  CT HEAD WITHOUT CONTRAST  Technique:  Contiguous axial images were obtained from the base of the skull through the vertex without contrast.  Comparison: None.  Findings: Minimally enlarged ventricles and subarachnoid spaces. Mild patchy white matter low density in both cerebral hemispheres. No intracranial hemorrhage, mass lesion or CT evidence of acute infarction.  Left sphenoid sinus mucosal thickening.  Bilateral maxillary sinus retention cysts.  IMPRESSION:  1.  No acute abnormality. 2.  Minimal atrophy and mild chronic small vessel white matter ischemic  changes. 3.  Chronic left sphenoid sinusitis.   Original Report Authenticated By: Beckie Salts, M.D.     1. Ataxia   2. Weakness       MDM  CATHERYN SLIFER is a 67 y.o. female here with AMS. Likely from hyponatremia but given unsteady gait will get CT head. I doubt acute stroke. Also dehydrated so will give IVf and reassess.   7:30 PM  I called Dr. Julian Reil, hospitalist, who request UA.   9PM  UA nl. Patient still weak. He will assess patient later.   9:30 PM I called Dr. Thad Ranger from neurology. She felt that she should be r/o viral meningitis given low grade temp, visual hallucinations, and ataxia. Recommend LP and she will see patient.   10:46 PM LP performed, CSF sent. Neuro recommend starting acyclovir and hold off on ceftriaxone and vanc for now. Dr. Julian Reil will admit the patient.      Richardean Canal, MD 06/25/12 2249

## 2012-06-25 NOTE — ED Notes (Signed)
MD at bedside. 

## 2012-06-26 ENCOUNTER — Observation Stay (HOSPITAL_COMMUNITY): Payer: Medicare Other

## 2012-06-26 DIAGNOSIS — J9611 Chronic respiratory failure with hypoxia: Secondary | ICD-10-CM | POA: Diagnosis present

## 2012-06-26 DIAGNOSIS — R0902 Hypoxemia: Secondary | ICD-10-CM

## 2012-06-26 DIAGNOSIS — J4489 Other specified chronic obstructive pulmonary disease: Secondary | ICD-10-CM

## 2012-06-26 DIAGNOSIS — J441 Chronic obstructive pulmonary disease with (acute) exacerbation: Secondary | ICD-10-CM

## 2012-06-26 DIAGNOSIS — J449 Chronic obstructive pulmonary disease, unspecified: Secondary | ICD-10-CM | POA: Diagnosis present

## 2012-06-26 DIAGNOSIS — J9610 Chronic respiratory failure, unspecified whether with hypoxia or hypercapnia: Principal | ICD-10-CM

## 2012-06-26 DIAGNOSIS — R279 Unspecified lack of coordination: Secondary | ICD-10-CM

## 2012-06-26 HISTORY — DX: Chronic respiratory failure with hypoxia: J96.11

## 2012-06-26 HISTORY — DX: Chronic obstructive pulmonary disease with (acute) exacerbation: J44.1

## 2012-06-26 HISTORY — DX: Hypoxemia: R09.02

## 2012-06-26 LAB — CSF CELL COUNT WITH DIFFERENTIAL
RBC Count, CSF: 0 /mm3
WBC, CSF: 2 /mm3 (ref 0–5)

## 2012-06-26 LAB — BLOOD GAS, ARTERIAL
Bicarbonate: 28.1 mEq/L — ABNORMAL HIGH (ref 20.0–24.0)
Patient temperature: 98.6
TCO2: 26.4 mmol/L (ref 0–100)
pCO2 arterial: 49.5 mmHg — ABNORMAL HIGH (ref 35.0–45.0)
pH, Arterial: 7.372 (ref 7.350–7.450)

## 2012-06-26 LAB — BASIC METABOLIC PANEL
BUN: 5 mg/dL — ABNORMAL LOW (ref 6–23)
Chloride: 95 mEq/L — ABNORMAL LOW (ref 96–112)
GFR calc Af Amer: 84 mL/min — ABNORMAL LOW (ref >90)
GFR calc non Af Amer: 72 mL/min — ABNORMAL LOW (ref >90)
Potassium: 3.5 mEq/L (ref 3.5–5.1)
Sodium: 132 mEq/L — ABNORMAL LOW (ref 135–145)

## 2012-06-26 LAB — VITAMIN B12: Vitamin B-12: 408 pg/mL (ref 211–911)

## 2012-06-26 LAB — GRAM STAIN: Special Requests: NORMAL

## 2012-06-26 LAB — CBC
HCT: 27.6 % — ABNORMAL LOW (ref 36.0–46.0)
MCHC: 33.7 g/dL (ref 30.0–36.0)
RDW: 13.1 % (ref 11.5–15.5)
WBC: 4.8 10*3/uL (ref 4.0–10.5)

## 2012-06-26 MED ORDER — AMLODIPINE BESYLATE 10 MG PO TABS
10.0000 mg | ORAL_TABLET | Freq: Every day | ORAL | Status: DC
  Administered 2012-06-26 – 2012-06-27 (×2): 10 mg via ORAL
  Filled 2012-06-26 (×2): qty 1

## 2012-06-26 MED ORDER — SODIUM CHLORIDE 0.9 % IV SOLN
INTRAVENOUS | Status: DC
  Administered 2012-06-26 – 2012-06-27 (×2): via INTRAVENOUS

## 2012-06-26 MED ORDER — GUAIFENESIN ER 600 MG PO TB12
1200.0000 mg | ORAL_TABLET | Freq: Two times a day (BID) | ORAL | Status: DC | PRN
  Filled 2012-06-26: qty 2

## 2012-06-26 MED ORDER — TIOTROPIUM BROMIDE MONOHYDRATE 18 MCG IN CAPS
18.0000 ug | ORAL_CAPSULE | Freq: Every day | RESPIRATORY_TRACT | Status: DC
  Administered 2012-06-26 – 2012-06-27 (×2): 18 ug via RESPIRATORY_TRACT
  Filled 2012-06-26: qty 5

## 2012-06-26 MED ORDER — SODIUM CHLORIDE 0.9 % IJ SOLN
3.0000 mL | Freq: Two times a day (BID) | INTRAMUSCULAR | Status: DC
  Administered 2012-06-26: 3 mL via INTRAVENOUS

## 2012-06-26 MED ORDER — LOSARTAN POTASSIUM 50 MG PO TABS
100.0000 mg | ORAL_TABLET | Freq: Two times a day (BID) | ORAL | Status: DC
  Administered 2012-06-26: 100 mg via ORAL
  Filled 2012-06-26 (×2): qty 2

## 2012-06-26 MED ORDER — LOSARTAN POTASSIUM 50 MG PO TABS
100.0000 mg | ORAL_TABLET | Freq: Every day | ORAL | Status: DC
  Administered 2012-06-27: 100 mg via ORAL
  Filled 2012-06-26: qty 2

## 2012-06-26 MED ORDER — HEPARIN SODIUM (PORCINE) 5000 UNIT/ML IJ SOLN
5000.0000 [IU] | Freq: Three times a day (TID) | INTRAMUSCULAR | Status: DC
  Administered 2012-06-26 – 2012-06-27 (×4): 5000 [IU] via SUBCUTANEOUS
  Filled 2012-06-26 (×8): qty 1

## 2012-06-26 MED ORDER — OXYCODONE HCL 5 MG PO TABS
10.0000 mg | ORAL_TABLET | Freq: Three times a day (TID) | ORAL | Status: DC | PRN
  Administered 2012-06-26 – 2012-06-27 (×4): 10 mg via ORAL
  Filled 2012-06-26: qty 1
  Filled 2012-06-26 (×3): qty 2
  Filled 2012-06-26: qty 1

## 2012-06-26 MED ORDER — ALBUTEROL SULFATE HFA 108 (90 BASE) MCG/ACT IN AERS: 2.0000 | INHALATION_SPRAY | Freq: Four times a day (QID) | RESPIRATORY_TRACT | Status: DC | PRN

## 2012-06-26 MED ORDER — ALPRAZOLAM 1 MG PO TABS
1.0000 mg | ORAL_TABLET | Freq: Every evening | ORAL | Status: DC | PRN
  Administered 2012-06-26: 1 mg via ORAL
  Filled 2012-06-26: qty 1

## 2012-06-26 MED ORDER — PNEUMOCOCCAL VAC POLYVALENT 25 MCG/0.5ML IJ INJ
0.5000 mL | INJECTION | INTRAMUSCULAR | Status: AC
  Administered 2012-06-27: 0.5 mL via INTRAMUSCULAR
  Filled 2012-06-26 (×2): qty 0.5

## 2012-06-26 MED ORDER — DOXYCYCLINE HYCLATE 100 MG PO TABS
100.0000 mg | ORAL_TABLET | Freq: Two times a day (BID) | ORAL | Status: DC
  Administered 2012-06-26 – 2012-06-27 (×3): 100 mg via ORAL
  Filled 2012-06-26 (×4): qty 1

## 2012-06-26 MED ORDER — FLUOXETINE HCL 20 MG PO CAPS
40.0000 mg | ORAL_CAPSULE | Freq: Every morning | ORAL | Status: DC
  Administered 2012-06-26 – 2012-06-27 (×2): 40 mg via ORAL
  Filled 2012-06-26 (×2): qty 2

## 2012-06-26 MED ORDER — PANTOPRAZOLE SODIUM 40 MG PO TBEC
40.0000 mg | DELAYED_RELEASE_TABLET | Freq: Every day | ORAL | Status: DC
  Administered 2012-06-26 – 2012-06-27 (×2): 40 mg via ORAL
  Filled 2012-06-26: qty 1
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
## 2012-06-26 NOTE — Consult Note (Signed)
Reason for Consult:Altered mental status Referring Physician: Silverio Lay  CC: Confusion, hallucinations  HPI: Allison Vaughan is an 67 y.o. female who was recently admitted to the hospital for COPD exacerbation and hyponatremia.  Patient was discharged 2 weeks ago.  Was brought to the ED per daughter.  She reports that the patient has continued to be short of breath and is coughing.  Has a low grade temperature.  Is confused at times and has been experiencing visual hallucinations reporting that she sees family members in space ships.  The patient denies any hallucinations.  Patient has also been off balance.  She reports that she has had vertigo in the past and on this occasion has no sensation of the room spinning but feels that her balance is off.  She has been falling.   With the above complaints and the fact that the patient has been on steroids, LP was recommended.  LP shows a normal protein and glucose.  2 wbc's were seen.  Culture is pending but based on above results infection, including herpes encephalitis is unlikely.    Past Medical History  Diagnosis Date  . Hypertension   . Chronic pain   . Anxiety   . COPD (chronic obstructive pulmonary disease)     Past Surgical History  Procedure Laterality Date  . Eye surgery      Family history:  Mother died with Alzheimer's.  Father with CAD, deceased from an MI.  One brother with alcoholism and another with cancer.  Daughters alive and well.    Social History:  reports that she has been smoking Cigarettes.  She has been smoking about 1.00 pack per day. She does not have any smokeless tobacco history on file. She reports that she drinks about 1.2 ounces of alcohol per week. She reports that she does not use illicit drugs.  No Known Allergies  Medications:  I have reviewed the patient's current medications. Prior to Admission:  Prescriptions prior to admission  Medication Sig Dispense Refill  . albuterol (PROVENTIL HFA;VENTOLIN HFA) 108  (90 BASE) MCG/ACT inhaler Inhale 2 puffs into the lungs every 6 (six) hours as needed for wheezing.  1 Inhaler  2  . ALPRAZolam (XANAX) 1 MG tablet Take 1 mg by mouth at bedtime as needed. For anxiety      . amLODipine (NORVASC) 10 MG tablet Take 1 tablet (10 mg total) by mouth daily.  30 tablet  0  . FLUoxetine (PROZAC) 40 MG capsule Take 40 mg by mouth every morning.       Marland Kitchen guaiFENesin (MUCINEX) 600 MG 12 hr tablet Take 1,200 mg by mouth 2 (two) times daily as needed for congestion.      Marland Kitchen losartan (COZAAR) 100 MG tablet Take 1 tablet (100 mg total) by mouth 2 (two) times daily.  60 tablet  0  . omeprazole (PRILOSEC) 40 MG capsule Take 40 mg by mouth daily.       . Oxycodone HCl 10 MG TABS Take 10 mg by mouth 3 (three) times daily as needed. For pain      . tiotropium (SPIRIVA HANDIHALER) 18 MCG inhalation capsule Place 1 capsule (18 mcg total) into inhaler and inhale daily.  30 capsule  12   Scheduled: . amLODipine  10 mg Oral Daily  . doxycycline  100 mg Oral Q12H  . FLUoxetine  40 mg Oral q morning - 10a  . heparin  5,000 Units Subcutaneous Q8H  . lidocaine      .  losartan  100 mg Oral BID  . pantoprazole  40 mg Oral Daily  . sodium chloride  3 mL Intravenous Q12H  . tiotropium  18 mcg Inhalation Daily    ROS: History obtained from the patient  General ROS: as noted in HPI Psychological ROS: depression since the passing of her husband lat year Ophthalmic ROS: negative for - blurry vision, double vision, eye pain or loss of vision ENT ROS: negative for - epistaxis, nasal discharge, oral lesions, sore throat, tinnitus or vertigo Allergy and Immunology ROS: negative for - hives or itchy/watery eyes Hematological and Lymphatic ROS: negative for - bleeding problems, bruising or swollen lymph nodes Endocrine ROS: negative for - galactorrhea, hair pattern changes, polydipsia/polyuria or temperature intolerance Respiratory ROS: as noted in HPI Cardiovascular ROS: negative for - chest  pain, dyspnea on exertion, edema or irregular heartbeat Gastrointestinal ROS: negative for - abdominal pain, diarrhea, hematemesis, nausea/vomiting or stool incontinence Genito-Urinary ROS: negative for - dysuria, hematuria, incontinence or urinary frequency/urgency Musculoskeletal ROS: negative for - joint swelling or muscular weakness Neurological ROS: as noted in HPI Dermatological ROS: negative for rash and skin lesion changes  Physical Examination: Blood pressure 130/68, pulse 88, temperature 99.2 F (37.3 C), temperature source Oral, resp. rate 20, height 5\' 1"  (1.549 m), weight 64.501 kg (142 lb 3.2 oz), SpO2 100.00%.  Neurologic Examination Mental Status: Alert.  Oriented to name.  Thought she was at [facility].  Knew the year and that it was the 13th but felt it was May and Thursday instead of Friday.  Speech fluent without evidence of aphasia.  Able to follow 3 step commands without difficulty. Cranial Nerves: II: Discs flat bilaterally; Visual fields grossly normal, pupils equal, round, reactive to light and accommodation III,IV, VI: ptosis not present, extra-ocular motions intact bilaterally V,VII: smile symmetric, facial light touch sensation normal bilaterally VIII: hearing normal bilaterally IX,X: gag reflex present XI: bilateral shoulder shrug XII: midline tongue extension Motor: Right : Upper extremity   5/5    Left:     Upper extremity   5/5  Lower extremity   5/5     Lower extremity   5/5 Tone and bulk:normal tone throughout; no atrophy noted Sensory: Pinprick and light touch intact throughout, bilaterally Deep Tendon Reflexes: 2+ and symmetric throughout Plantars: Right: mute   Left: mute Cerebellar: normal finger-to-nose and normal heel-to-shin test Gait: normal gait and station CV: pulses palpable throughout     Laboratory Studies:   Basic Metabolic Panel:  Recent Labs Lab 06/25/12 1745  NA 131*  K 3.6  CL 93*  CO2 29  GLUCOSE 102*  BUN 8   CREATININE 0.92  CALCIUM 8.8    Liver Function Tests:  Recent Labs Lab 06/25/12 1745  AST 15  ALT 9  ALKPHOS 128*  BILITOT 0.7  PROT 6.9  ALBUMIN 3.2*   No results found for this basename: LIPASE, AMYLASE,  in the last 168 hours No results found for this basename: AMMONIA,  in the last 168 hours  CBC:  Recent Labs Lab 06/25/12 1745  WBC 8.0  HGB 11.2*  HCT 32.2*  MCV 88.0  PLT 178    Cardiac Enzymes: No results found for this basename: CKTOTAL, CKMB, CKMBINDEX, TROPONINI,  in the last 168 hours  BNP: No components found with this basename: POCBNP,   CBG:  Recent Labs Lab 06/25/12 1946  GLUCAP 105*    Microbiology: Results for orders placed during the hospital encounter of 06/25/12  GRAM STAIN  Status: None   Collection Time    06/25/12 10:28 PM      Result Value Range Status   Specimen Description CSF   Final   Special Requests Normal   Final   Gram Stain     Final   Value: NO ORGANISMS SEEN     NO WBC SEEN     CYTOSPIN PREP     Gram Stain Report Called to,Read Back By and Verified With: NEILSON,T/ED @0024  ON 06/26/12 BY KARCZEWSKI,S.   Report Status 06/26/2012 FINAL   Final    Coagulation Studies: No results found for this basename: LABPROT, INR,  in the last 72 hours  Urinalysis:  Recent Labs Lab 06/25/12 2004  COLORURINE YELLOW  LABSPEC 1.009  PHURINE 6.5  GLUCOSEU NEGATIVE  HGBUR NEGATIVE  BILIRUBINUR NEGATIVE  KETONESUR NEGATIVE  PROTEINUR NEGATIVE  UROBILINOGEN 0.2  NITRITE NEGATIVE  LEUKOCYTESUR NEGATIVE    Lipid Panel:  No results found for this basename: chol, trig, hdl, cholhdl, vldl, ldlcalc    HgbA1C:  No results found for this basename: HGBA1C    Urine Drug Screen:     Component Value Date/Time   LABOPIA POSITIVE* 03/15/2011 1404   COCAINSCRNUR NONE DETECTED 03/15/2011 1404   LABBENZ POSITIVE* 03/15/2011 1404   AMPHETMU NONE DETECTED 03/15/2011 1404   THCU NONE DETECTED 03/15/2011 1404   LABBARB POSITIVE* 03/15/2011  1404    Alcohol Level: No results found for this basename: ETH,  in the last 168 hours   Imaging: Dg Chest 2 View  06/25/2012   *RADIOLOGY REPORT*  Clinical Data: Altered mental status, shortness of breath and chest pain.  CHEST - 2 VIEW  Comparison: 06/06/2012  Findings: Heart is mildly enlarged.  No edema or infiltrate is seen.  No evidence of nodule or pleural fluid.  The bony thorax shows stable mild degenerative changes and minimal scoliosis of the thoracic spine.  IMPRESSION: Mild cardiomegaly.  No active disease.   Original Report Authenticated By: Irish Lack, M.D.   Ct Head Wo Contrast  06/25/2012   *RADIOLOGY REPORT*  Clinical Data: Altered mental status.  CT HEAD WITHOUT CONTRAST  Technique:  Contiguous axial images were obtained from the base of the skull through the vertex without contrast.  Comparison: None.  Findings: Minimally enlarged ventricles and subarachnoid spaces. Mild patchy white matter low density in both cerebral hemispheres. No intracranial hemorrhage, mass lesion or CT evidence of acute infarction.  Left sphenoid sinus mucosal thickening.  Bilateral maxillary sinus retention cysts.  IMPRESSION:  1.  No acute abnormality. 2.  Minimal atrophy and mild chronic small vessel white matter ischemic changes. 3.  Chronic left sphenoid sinusitis.   Original Report Authenticated By: Beckie Salts, M.D.     Assessment/Plan: 67 year old female presenting with multiple complaints including cough, low grade temperature, confusion, hallucinations and difficulty with gait.  With no other source of infection identified and low grade temp and recent steroid use felt ruling out CNS infection was prudent.  LP performed in ED and does not suggest and infective process.  Patient is hyponatremic though and hypoxic and this may be leading to the above complaints.  Will rule out other possible etiologies.    Recommendations: 1.  Will follow up CSF cultures and herpes pcr 2.  MRI of the  brain 3.  D/C acyclovir 4.  PT consult 5.  Agree with addressing hyponatremia and hypoxia   Thana Farr, MD [facility] [phone] 06/26/2012, 3:46 AM

## 2012-06-26 NOTE — Progress Notes (Signed)
[facility] PROGRESS NOTE  Allison Vaughan ZOX:[id number] DOB: 01/31/1945 DOA: 06/25/2012 PCP: Ronnald Collum  Brief history 68 year old female with a history of hypertension, depression, anxiety, COPD with continued tobacco use presented with three-day history of generalized weakness, altered mentation, and gait instability. The patient's daughter was at the bedside to supplement the history. The patient's daughter stated that the patient appeared to be drunk and off balance and occasionally the patient would speak about tangential at times they have never occurred. The patient denies taking any extra opioids. The patient's daughter states that she administers the opioids to the patient herself. The patient states that she has continued to have a cough with yellow sputum which is slightly worse for the past 3 days. She states her breathing is not a worse than usual, and in fact, she states that it is possibly a little bit better than usual. There's been no history of vomiting, diarrhea, abdominal pain, dysuria, rashes, fevers, chills. The patient does complain of chest discomfort intermittently. Of note, the patient was recently discharged from the hospital after a stay from 06/06/2012-06/09/2012 for COPD exacerbation. The patient was scheduled to see Dr. Sandrea Hughs, but never followed up. The patient continued to smoke up to one half pack per day. She has smoked one pack per day x50 years. At the time of admission, the patient was noted to have a temperature 100.3 with a serum sodium 131. Her CBC was essentially unremarkable. The patient underwent a lumbar puncture during this admission. CSF showed 2 WBCs, protein 28, glucose 71. ABG showed 7.32/49/50/28 on room air.  Assessment/Plan: Altered Mental Status -likely multifactorial including hypoxemia, hypercarbia, hyponatremia, and opioids, xanax -check NH3, UDS, B12, RBC folate, RPR -IVF -CSF cell count does not suggest  meningitis Chest Pain -EKG -cycle troponin Hypoxemia -Pt will need supplemental oxygen -ambulatory pulseox HTN -decrease losartan to 100mg  once daily COPD -continue spiriva -add advair--pt state she is on this at home -aerosolized albuterol prn -does not appear decompensated Deconditioning -PT Hyponatremia -suspect degree of volume depletion -TSH 0.424 on 06/07/12 -IVF   Family Communication:   Daughter at beside Disposition Plan:   Home when medically stable   Antibiotics:  Doxycycline 06/26/12>>>    Procedures/Studies: Dg Chest 2 View  06/25/2012   *RADIOLOGY REPORT*  Clinical Data: Altered mental status, shortness of breath and chest pain.  CHEST - 2 VIEW  Comparison: 06/06/2012  Findings: Heart is mildly enlarged.  No edema or infiltrate is seen.  No evidence of nodule or pleural fluid.  The bony thorax shows stable mild degenerative changes and minimal scoliosis of the thoracic spine.  IMPRESSION: Mild cardiomegaly.  No active disease.   Original Report Authenticated By: Irish Lack, M.D.   Dg Chest 2 View  06/06/2012   *RADIOLOGY REPORT*  Clinical Data: Cough, congestion and shortness of breath; history of smoking.  CHEST - 2 VIEW  Comparison: Chest radiograph performed 03/05/2005  Findings: The lungs are well-aerated and clear.  There is no evidence of focal opacification, pleural effusion or pneumothorax.  The heart is normal in size; calcification is noted in the aortic arch.  No acute osseous abnormalities are seen.  IMPRESSION: No acute cardiopulmonary process seen.   Original Report Authenticated By: Tonia Ghent, M.D.   Ct Head Wo Contrast  06/25/2012   *RADIOLOGY REPORT*  Clinical Data: Altered mental status.  CT HEAD WITHOUT CONTRAST  Technique:  Contiguous axial images were obtained from the base of the skull through the vertex without contrast.  Comparison: None.  Findings: Minimally enlarged ventricles and subarachnoid spaces. Mild patchy white matter low  density in both cerebral hemispheres. No intracranial hemorrhage, mass lesion or CT evidence of acute infarction.  Left sphenoid sinus mucosal thickening.  Bilateral maxillary sinus retention cysts.  IMPRESSION:  1.  No acute abnormality. 2.  Minimal atrophy and mild chronic small vessel white matter ischemic changes. 3.  Chronic left sphenoid sinusitis.   Original Report Authenticated By: Beckie Salts, M.D.   Mr Brain Wo Contrast  06/26/2012   *RADIOLOGY REPORT*  Clinical Data: Altered mental status.  Unsteady gait. Hypertension. Hypercholesterolemia. History of hyponatremia.  MRI HEAD WITHOUT CONTRAST  Technique:  Multiplanar, multiecho pulse sequences of the brain and surrounding structures were obtained according to standard protocol without intravenous contrast.  Comparison: 06/25/2012 head CT.  No comparison brain MR.  Findings: No acute infarct.  Prominent patchy white matter type changes subcortical and periventricular region.  Patchy white matter type changes of the pons.  White matter changes felt most likely to be related to result of small vessel disease in this hypertensive patient with hypercholesterolemia. The pontine white matter type changes are felt unlikely to represent result of central pontine myelinolysis. There does not appear to be have been a rapid correction of low sodium recently upon review of labs.  Other causes of white matter type changes such as that secondary to vasculitis, inflammatory process or demyelinating process felt less likely possibilities.  No intracranial hemorrhage.  No hydrocephalus.  No intracranial mass lesion detected on this unenhanced exam.  Major intracranial vascular structures are patent.  Partial opacification left mastoid air cells.  No mass seen in the region of the posterior-superior nasopharynx contributing to the left eustachian tube dysfunction.  Paranasal sinus mucosal thickening most notable left sphenoid sinus air cell (polypoid opacification) and  polypoid opacification inferior right maxillary sinus.  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  Cervical medullary junction, pituitary region, pineal region and orbital structures unremarkable.  IMPRESSION: No acute infarct.  Prominent white matter type changes most consistent with result of small vessel disease as detailed above.  Left mastoid air cell opacification and paranasal sinus opacification as detailed above.   Original Report Authenticated By: Lacy Duverney, M.D.         Subjective: Patient complains of intermittent chest discomfort. She denies any current shortness of breath, nausea, vomiting, diarrhea, dizziness, headache. No dysuria or hematuria. Denies any abdominal pain, hematochezia, melena.  Objective: Filed Vitals:   06/26/12 0029 06/26/12 0130 06/26/12 0446 06/26/12 0905  BP:  130/68 112/56   Pulse: 87 88 84   Temp:  99.2 F (37.3 C) 98.8 F (37.1 C)   TempSrc:  Oral Oral   Resp:  20 20   Height:  5\' 1"  (1.549 m)    Weight:  64.501 kg (142 lb 3.2 oz)    SpO2: 86% 100% 97% 98%    Intake/Output Summary (Last 24 hours) at 06/26/12 1446 Last data filed at 06/26/12 1100  Gross per 24 hour  Intake    360 ml  Output    900 ml  Net   -540 ml   Weight change:  Exam:   General:  Pt is alert, follows commands appropriately, not in acute distress  HEENT: No icterus, No thrush,  [place]/AT  Cardiovascular: RRR, S1/S2, no rubs, no gallops  Respiratory: Diminished breath sounds at the bases without any wheezing. No rhonchi.  Abdomen: Soft/+BS, non tender, non distended, no guarding  Extremities: No edema, No lymphangitis, No petechiae, No rashes, no synovitis  Data Reviewed: Basic Metabolic Panel:  Recent Labs Lab 06/25/12 1745 06/26/12 0525  NA 131* 132*  K 3.6 3.5  CL 93* 95*  CO2 29 30  GLUCOSE 102* 92  BUN 8 5*  CREATININE 0.92 0.81  CALCIUM 8.8 8.5   Liver Function Tests:  Recent Labs Lab 06/25/12 1745  AST 15   ALT 9  ALKPHOS 128*  BILITOT 0.7  PROT 6.9  ALBUMIN 3.2*   No results found for this basename: LIPASE, AMYLASE,  in the last 168 hours No results found for this basename: AMMONIA,  in the last 168 hours CBC:  Recent Labs Lab 06/25/12 1745 06/26/12 0525  WBC 8.0 4.8  HGB 11.2* 9.3*  HCT 32.2* 27.6*  MCV 88.0 87.3  PLT 178 141*   Cardiac Enzymes: No results found for this basename: CKTOTAL, CKMB, CKMBINDEX, TROPONINI,  in the last 168 hours BNP: No components found with this basename: POCBNP,  CBG:  Recent Labs Lab 06/25/12 1946  GLUCAP 105*    Recent Results (from the past 240 hour(s))  CSF CULTURE     Status: None   Collection Time    06/25/12 10:28 PM      Result Value Range Status   Specimen Description CSF   Final   Special Requests Normal   Final   Gram Stain     Final   Value: CYTOSPIN SLIDE NO WBC SEEN     NO ORGANISMS SEEN     Gram Stain Report Called to,Read Back By and Verified With: Gram Stain Report Called to,Read Back By and Verified With: NEILSONT ED 0024 06/26/12 BY Governor Specking S Performed at [facility]   Culture PENDING   Incomplete   Report Status PENDING   Incomplete  GRAM STAIN     Status: None   Collection Time    06/25/12 10:28 PM      Result Value Range Status   Specimen Description CSF   Final   Special Requests Normal   Final   Gram Stain     Final   Value: NO ORGANISMS SEEN     NO WBC SEEN     CYTOSPIN PREP     Gram Stain Report Called to,Read Back By and Verified With: NEILSON,T/ED @0024  ON 06/26/12 BY KARCZEWSKI,S.   Report Status 06/26/2012 FINAL   Final     Scheduled Meds: . amLODipine  10 mg Oral Daily  . doxycycline  100 mg Oral Q12H  . FLUoxetine  40 mg Oral q morning - 10a  . heparin  5,000 Units Subcutaneous Q8H  . [START ON 06/27/2012] losartan  100 mg Oral Daily  . pantoprazole  40 mg Oral Daily  . sodium chloride  3 mL Intravenous Q12H  . tiotropium  18 mcg Inhalation Daily   Continuous Infusions: .  sodium chloride       Wilsie Kern, DO  [facility] Pager [phone]  If 7PM-7AM, please contact night-coverage [email or web address] Password [email or web address] 06/26/2012, 2:46 PM   LOS: 1 day

## 2012-06-26 NOTE — H&P (Signed)
[facility] History and Physical  Allison Vaughan YNW:[id number] DOB: 03-May-1945 DOA: 06/25/2012  Referring physician: ED PCP: Coralee Rud, PA-C   Chief Complaint: SOB, fatigue  HPI: Allison Vaughan is a 67 y.o. female who presents to the ED with ongoing SOB, generalized weakness, and excessive sleeping per her daughter.  She was discharged from hospital 2 weeks ago after being admitted with PNA and finished a course of prednisone several days ago.  Some low grade temperatures at home, has had stable cough during this time period.  Patient states that she attributes all of this to continuing to be SOB.  In the ED work up didn't demonstrate an obvious source of infection, but patient was noted to be hypoxic and ABGs revealed her room O2 sat to be only 86%.  She is not on chronic O2 with her COPD at home.  Hospitalist is admitting for apparent COPD exacerbation vs baseline O2 dependence not diagnosed or treated as such.  Review of Systems: 12 systems reviewed and otherwise negative.  Past Medical History  Diagnosis Date  . Hypertension   . Chronic pain   . Anxiety   . COPD (chronic obstructive pulmonary disease)    Past Surgical History  Procedure Laterality Date  . Eye surgery     Social History:  reports that she has been smoking Cigarettes.  She has been smoking about 1.00 pack per day. She does not have any smokeless tobacco history on file. She reports that she drinks about 1.2 ounces of alcohol per week. She reports that she does not use illicit drugs.  No Known Allergies  History reviewed. No pertinent family history.   Prior to Admission medications   Medication Sig Start Date End Date Taking? Authorizing Provider  albuterol (PROVENTIL HFA;VENTOLIN HFA) 108 (90 BASE) MCG/ACT inhaler Inhale 2 puffs into the lungs every 6 (six) hours as needed for wheezing. 06/09/12  Yes Laveda Norman, MD  ALPRAZolam Prudy Feeler) 1 MG tablet Take 1 mg by mouth at bedtime as needed. For  anxiety   Yes Historical Provider, MD  amLODipine (NORVASC) 10 MG tablet Take 1 tablet (10 mg total) by mouth daily. 06/09/12  Yes Laveda Norman, MD  FLUoxetine (PROZAC) 40 MG capsule Take 40 mg by mouth every morning.    Yes Historical Provider, MD  guaiFENesin (MUCINEX) 600 MG 12 hr tablet Take 1,200 mg by mouth 2 (two) times daily as needed for congestion.   Yes Historical Provider, MD  losartan (COZAAR) 100 MG tablet Take 1 tablet (100 mg total) by mouth 2 (two) times daily. 06/09/12  Yes Laveda Norman, MD  omeprazole (PRILOSEC) 40 MG capsule Take 40 mg by mouth daily.    Yes Historical Provider, MD  Oxycodone HCl 10 MG TABS Take 10 mg by mouth 3 (three) times daily as needed. For pain   Yes Historical Provider, MD  tiotropium (SPIRIVA HANDIHALER) 18 MCG inhalation capsule Place 1 capsule (18 mcg total) into inhaler and inhale daily. 06/09/12  Yes Laveda Norman, MD   Physical Exam: Filed Vitals:   06/25/12 1935 06/25/12 2005 06/26/12 0021 06/26/12 0029  BP:  129/66 127/55   Pulse:  70  87  Temp:  98.7 F (37.1 C)    TempSrc:  Oral    Resp:  18    Weight: 61.689 kg (136 lb)     SpO2:  92%  86%    General:  NAD, resting comfortably in bed Eyes: PEERLA EOMI ENT: mucous membranes  moist Neck: supple w/o JVD Cardiovascular: RRR w/o MRG Respiratory: Whet sounding cough, but when auscultated CTA B and not really wheezy Abdomen: soft, nt, nd, bs+ Skin: no rash nor lesion Musculoskeletal: MAE, full ROM all 4 extremities Psychiatric: normal tone and affect Neurologic: AAOx3, grossly non-focal  Labs on Admission:  Basic Metabolic Panel:  Recent Labs Lab 06/25/12 1745  NA 131*  K 3.6  CL 93*  CO2 29  GLUCOSE 102*  BUN 8  CREATININE 0.92  CALCIUM 8.8   Liver Function Tests:  Recent Labs Lab 06/25/12 1745  AST 15  ALT 9  ALKPHOS 128*  BILITOT 0.7  PROT 6.9  ALBUMIN 3.2*   No results found for this basename: LIPASE, AMYLASE,  in the last 168 hours No results found for this  basename: AMMONIA,  in the last 168 hours CBC:  Recent Labs Lab 06/25/12 1745  WBC 8.0  HGB 11.2*  HCT 32.2*  MCV 88.0  PLT 178   Cardiac Enzymes: No results found for this basename: CKTOTAL, CKMB, CKMBINDEX, TROPONINI,  in the last 168 hours  BNP (last 3 results) No results found for this basename: PROBNP,  in the last 8760 hours CBG:  Recent Labs Lab 06/25/12 1946  GLUCAP 105*    Radiological Exams on Admission: Dg Chest 2 View  06/25/2012   *RADIOLOGY REPORT*  Clinical Data: Altered mental status, shortness of breath and chest pain.  CHEST - 2 VIEW  Comparison: 06/06/2012  Findings: Heart is mildly enlarged.  No edema or infiltrate is seen.  No evidence of nodule or pleural fluid.  The bony thorax shows stable mild degenerative changes and minimal scoliosis of the thoracic spine.  IMPRESSION: Mild cardiomegaly.  No active disease.   Original Report Authenticated By: Irish Lack, M.D.   Ct Head Wo Contrast  06/25/2012   *RADIOLOGY REPORT*  Clinical Data: Altered mental status.  CT HEAD WITHOUT CONTRAST  Technique:  Contiguous axial images were obtained from the base of the skull through the vertex without contrast.  Comparison: None.  Findings: Minimally enlarged ventricles and subarachnoid spaces. Mild patchy white matter low density in both cerebral hemispheres. No intracranial hemorrhage, mass lesion or CT evidence of acute infarction.  Left sphenoid sinus mucosal thickening.  Bilateral maxillary sinus retention cysts.  IMPRESSION:  1.  No acute abnormality. 2.  Minimal atrophy and mild chronic small vessel white matter ischemic changes. 3.  Chronic left sphenoid sinusitis.   Original Report Authenticated By: Beckie Salts, M.D.    EKG: Independently reviewed.  Assessment/Plan Principal Problem:   COPD exacerbation Active Problems:   Chronic hypoxemic respiratory failure   COPD (chronic obstructive pulmonary disease)   1. COPD - will treat as mild exacerbation,  doxycycline ordered since patient does have fever, not really impressed with a large amount of wheezing to start steroids on the patient especially since she just got off of these.  More concerning is the large degree of desaturation despite the relatively modest pulmonary findings on imaging and exam which suggests possibly a more severe degree of baseline COPD than originally suspected in this patient (what this patient may actually need is chronic O2 at baseline).  May benefit from pulmonology evaluation and long term pulm follow up if this is indeed the case. 2. AMS - large work up done in ED but I suspect that this may simply be due to desaturation chronically as the underlying cause. 3. HTN - continue home meds    Code Status: Full Code (  must indicate code status--if unknown or must be presumed, indicate so) Family Communication: No family in room (indicate person spoken with, if applicable, with phone number if by telephone) Disposition Plan: Admit to obs (indicate anticipated LOS)  Time spent: 70 min  GARDNER, JARED M. [facility] Pager [phone]  If 7PM-7AM, please contact night-coverage [email or web address] Password [email or web address] 06/26/2012, 1:07 AM

## 2012-06-26 NOTE — Care Management Note (Addendum)
    Page 1 of 2   06/26/2012     4:26:40 PM   CARE MANAGEMENT NOTE 06/26/2012  Patient:  Allison Vaughan, Allison Vaughan   Account Number:  [id number]  Date Initiated:  06/26/2012  Documentation initiated by:  Lanier Clam  Subjective/Objective Assessment:   ADMITTED W/SOB.COPD EXAC.     Action/Plan:   FROM HOME W/FAMILY.HAS PCP,PHARMACY.   Anticipated DC Date:  06/27/2012   Anticipated DC Plan:  HOME W HOME HEALTH SERVICES      DC Planning Services  CM consult  Medication Assistance      Choice offered to / List presented to:  C-1 Patient           Status of service:  In process, will continue to follow Medicare Important Message given?   (If response is "NO", the following Medicare IM given date fields will be blank) Date Medicare IM given:   Date Additional Medicare IM given:    Discharge Disposition:    Per UR Regulation:  Reviewed for med. necessity/level of care/duration of stay  If discussed at Long Length of Stay Meetings, dates discussed:    Comments:  06/26/12 [name] RN,BSN NCM [phone] IF HOME 02 NEEDED PLEASE SEE STICKY NOTE IN EPIC ON TREATMENT TEAM STICKY NOTE.[id number] SATS DOCUMENTED IN PROGRESS NOTES@ REST ON RA,AMBULATING,THEN AMBULATING W/02.IF HHRN/PT,RW NEEDED WILL NEED HH ORDERS,& RW ORDERS.ALSO IF QUALIFIES FOR HOME 02 WILL NEED HMOE 02 ORDER W/LITER FLOW,& DURATION. INFORMED PATIENT OF SCRIPT BENEFIT-SPIRIVA-$45 CO PAY-MAIL ORDER 3MONTHS/$125.ALBUTEROL $45,MAIL ORDER$125=3 MONTHS.[facility] RX-MAIL ORDER-INFORMED PATIENT THAT ONCE GIVESHER SCRIPT TO PHARMACIST & ASKS FOR MAIL ORDER THEY SHOULD KNOW WHAT STEPS TO TAKE FOR [employer] RX [phone].PATIENT VOICED UNDERSTANDING. NOTED [facility] HAS ALREADY PROVIDED W/RESOURCES.WILLCHECK BENEFIT FOR PROCESS OF MAIL ORDER.PATIENT HAS SCRIPT COVERAGE.PT-HH,RW.PROVIDED W/HHC AGENCY LIST.WOULD RECOMMEND HHRN-DISEASE MGMNT,MED INSTRUCTION.

## 2012-06-26 NOTE — Progress Notes (Signed)
Ambulated in the hall without O2, tolerated well. O2 sat noted to be 96-98%. Will cont to monitor.

## 2012-06-27 DIAGNOSIS — R4182 Altered mental status, unspecified: Secondary | ICD-10-CM

## 2012-06-27 DIAGNOSIS — R0902 Hypoxemia: Secondary | ICD-10-CM

## 2012-06-27 HISTORY — DX: Altered mental status, unspecified: R41.82

## 2012-06-27 LAB — BASIC METABOLIC PANEL
BUN: 4 mg/dL — ABNORMAL LOW (ref 6–23)
Chloride: 97 mEq/L (ref 96–112)
GFR calc Af Amer: >90 mL/min (ref >90)
Glucose, Bld: 94 mg/dL (ref 70–99)
Potassium: 3.1 mEq/L — ABNORMAL LOW (ref 3.5–5.1)

## 2012-06-27 LAB — CBC
HCT: 29.3 % — ABNORMAL LOW (ref 36.0–46.0)
Hemoglobin: 10 g/dL — ABNORMAL LOW (ref 12.0–15.0)
MCHC: 34.1 g/dL (ref 30.0–36.0)

## 2012-06-27 LAB — RAPID URINE DRUG SCREEN, HOSP PERFORMED
Amphetamines: NOT DETECTED
Tetrahydrocannabinol: NOT DETECTED

## 2012-06-27 MED ORDER — DOXYCYCLINE HYCLATE 100 MG PO TABS: 100.0000 mg | ORAL_TABLET | Freq: Two times a day (BID) | ORAL | Status: DC

## 2012-06-27 MED ORDER — LOSARTAN POTASSIUM 100 MG PO TABS: 100.0000 mg | ORAL_TABLET | Freq: Every day | ORAL | Status: DC

## 2012-06-27 NOTE — Progress Notes (Signed)
Physical Therapy Treatment Patient Details Name: Allison Vaughan MRN: [id number] DOB: 1945/12/19 Today's Date: 06/27/2012 Time: 1001-1016 PT Time Calculation (min): 15 min  PT Assessment / Plan / Recommendation Comments on Treatment Session  Pt educated in use of RW for steadying during gait and agreeable to use at home.  Recommend HHPT and RW upon d/c.    Follow Up Recommendations  Home health PT;Supervision for mobility/OOB     Does the patient have the potential to tolerate intense rehabilitation     Barriers to Discharge        Equipment Recommendations  Rolling walker with 5" wheels    Recommendations for Other Services    Frequency     Plan Discharge plan remains appropriate;Frequency remains appropriate    Precautions / Restrictions Precautions Precautions: Fall Precaution Comments: monitor sats Restrictions Weight Bearing Restrictions: No   Pertinent Vitals/Pain Reports chronic pain R LE and back, RN states no pain meds due    Mobility  Bed Mobility Bed Mobility: Sit to Supine;Supine to Sit Supine to Sit: 5: Supervision Sit to Supine: 5: Supervision Transfers Transfers: Sit to Stand;Stand to Sit Sit to Stand: 4: Min guard;With upper extremity assist;From bed Stand to Sit: 4: Min guard;With upper extremity assist;To bed Details for Transfer Assistance: min/guard for safety Ambulation/Gait Ambulation/Gait Assistance: 4: Min assist Ambulation Distance (Feet): 120 Feet Assistive device: Rolling walker Ambulation/Gait Assistance Details: pt educated in safe use of RW, LOBx1 requiring assist to correct, increased cues for pushing down through UEs to assist with steadying Gait Pattern: Step-through pattern Gait velocity: decreased General Gait Details: SaO2 upon return to room after ambulation 90% room air    Exercises     PT Diagnosis:    PT Problem List:   PT Treatment Interventions:     PT Goals Acute Rehab PT Goals PT Goal: Sit to Stand - Progress:  Progressing toward goal PT Goal: Stand to Sit - Progress: Progressing toward goal PT Goal: Ambulate - Progress: Progressing toward goal  Visit Information  Last PT Received On: 06/27/12 Assistance Needed: +1    Subjective Data  Subjective: Thanks for thinking of me. Patient Stated Goal: home   Cognition  Cognition Arousal/Alertness: Awake/alert Behavior During Therapy: WFL for tasks assessed/performed Overall Cognitive Status: Within Functional Limits for tasks assessed    Balance     End of Session PT - End of Session Activity Tolerance: Patient tolerated treatment well Patient left: in bed;with call bell/phone within reach;with bed alarm set Nurse Communication: Patient requests pain meds   GP     Raenell Mensing,KATHrine E 06/27/2012, 10:25 AM Zenovia Jarred, PT, DPT 06/27/2012 Pager: [phone]

## 2012-06-27 NOTE — Care Management Note (Signed)
Cm spoke with patient concerning discharge planning with adult son present in room. Pt offered choice for [facility]. Per pt choice [facility] to provide [facility] services upon discharge. MD order for RW & nebulizer machine. [facility] notified of [facility] referral and DME needs. DME delivery scheduled to room prior to discharge. Pt's daughter to provide tx home and assist in home care. No other needs identified.   Roxy Manns Jackolyn Geron,RN,BSN [phone]

## 2012-06-27 NOTE — Progress Notes (Signed)
Patient ambulated in hallway on room air. Patient's oxygen saturation remained at 96% for the entire walk. Erskin Burnet

## 2012-06-27 NOTE — Progress Notes (Signed)
Subjective: Improved mental status. Complaining of diffuse pain. No overnight significant clinical events reported. No longer experiencing hallucinations.  Objective: Current vital signs: BP 121/55  Pulse 83  Temp(Src) 98.8 F (37.1 C) (Oral)  Resp 20  Ht 5\' 1"  (1.549 m)  Wt 64.501 kg (142 lb 3.2 oz)  BMI 26.88 kg/m2  SpO2 92%  Neurologic Exam: Alert and in no acute distress. Patient is well-oriented to time as well as place. Speech was normal. Face was symmetrical with no signs of focal weakness. Moved extremities equally with no signs of weakness.  Lab Results: Results for orders placed during the hospital encounter of 06/25/12 (from the past 48 hour(s))  CBC     Status: Abnormal   Collection Time    06/25/12  5:45 PM      Result Value Range   WBC 8.0  4.0 - 10.5 K/uL   RBC 3.66 (*) 3.87 - 5.11 MIL/uL   Hemoglobin 11.2 (*) 12.0 - 15.0 g/dL   HCT 65.7 (*) 84.6 - 96.2 %   MCV 88.0  78.0 - 100.0 fL   MCH 30.6  26.0 - 34.0 pg   MCHC 34.8  30.0 - 36.0 g/dL   RDW 95.2  84.1 - 32.4 %   Platelets 178  150 - 400 K/uL  COMPREHENSIVE METABOLIC PANEL     Status: Abnormal   Collection Time    06/25/12  5:45 PM      Result Value Range   Sodium 131 (*) 135 - 145 mEq/L   Potassium 3.6  3.5 - 5.1 mEq/L   Chloride 93 (*) 96 - 112 mEq/L   CO2 29  19 - 32 mEq/L   Glucose, Bld 102 (*) 70 - 99 mg/dL   BUN 8  6 - 23 mg/dL   Creatinine, Ser 4.01  0.50 - 1.10 mg/dL   Calcium 8.8  8.4 - 02.7 mg/dL   Total Protein 6.9  6.0 - 8.3 g/dL   Albumin 3.2 (*) 3.5 - 5.2 g/dL   AST 15  0 - 37 U/L   ALT 9  0 - 35 U/L   Alkaline Phosphatase 128 (*) 39 - 117 U/L   Total Bilirubin 0.7  0.3 - 1.2 mg/dL   GFR calc non Af Amer 62 (*) >90 mL/min   GFR calc Af Amer 72 (*) >90 mL/min   Comment:            The eGFR has been calculated     using the CKD EPI equation.     This calculation has not been     validated in all clinical     situations.     eGFR's persistently     <90 mL/min signify   possible Chronic Kidney Disease.  GLUCOSE, CAPILLARY     Status: Abnormal   Collection Time    06/25/12  7:46 PM      Result Value Range   Glucose-Capillary 105 (*) 70 - 99 mg/dL  URINALYSIS, ROUTINE W REFLEX MICROSCOPIC     Status: None   Collection Time    06/25/12  8:04 PM      Result Value Range   Color, Urine YELLOW  YELLOW   APPearance CLEAR  CLEAR   Specific Gravity, Urine 1.009  1.005 - 1.030   pH 6.5  5.0 - 8.0   Glucose, UA NEGATIVE  NEGATIVE mg/dL   Hgb urine dipstick NEGATIVE  NEGATIVE   Bilirubin Urine NEGATIVE  NEGATIVE   Ketones, ur NEGATIVE  NEGATIVE mg/dL   Protein, ur NEGATIVE  NEGATIVE mg/dL   Urobilinogen, UA 0.2  0.0 - 1.0 mg/dL   Nitrite NEGATIVE  NEGATIVE   Leukocytes, UA NEGATIVE  NEGATIVE   Comment: MICROSCOPIC NOT DONE ON URINES WITH NEGATIVE PROTEIN, BLOOD, LEUKOCYTES, NITRITE, OR GLUCOSE <1000 mg/dL.  CSF CELL COUNT WITH DIFFERENTIAL     Status: Abnormal   Collection Time    06/25/12 10:28 PM      Result Value Range   Tube # tube 1     Color, CSF COLORLESS  COLORLESS   Comment: TUBE 1   Appearance, CSF CLEAR  CLEAR   RBC Count, CSF 42 (*) 0 /cu mm   WBC, CSF 2  0 - 5 /cu mm   Other Cells, CSF TOO FEW TO COUNT, SMEAR AVAILABLE FOR REVIEW     Comment: RARE LYMPHOCYTE SEEN  CSF CELL COUNT WITH DIFFERENTIAL     Status: None   Collection Time    06/25/12 10:28 PM      Result Value Range   Tube # tube 4     Color, CSF COLORLESS  COLORLESS   Comment: TUBE 4   Appearance, CSF CLEAR  CLEAR   RBC Count, CSF 0  0 /cu mm   WBC, CSF 2  0 - 5 /cu mm   Other Cells, CSF TOO FEW TO COUNT, SMEAR AVAILABLE FOR REVIEW     Comment: RARE LYMPHOCYTE SEEN  CSF CULTURE     Status: None   Collection Time    06/25/12 10:28 PM      Result Value Range   Specimen Description CSF     Special Requests Normal     Gram Stain       Value: CYTOSPIN SLIDE NO WBC SEEN     NO ORGANISMS SEEN     Gram Stain Report Called to,Read Back By and Verified With: Gram Stain Report  Called to,Read Back By and Verified With: NEILSONT ED 0024 06/26/12 BY Governor Specking S Performed at [facility]   Culture PENDING     Report Status PENDING    GRAM STAIN     Status: None   Collection Time    06/25/12 10:28 PM      Result Value Range   Specimen Description CSF     Special Requests Normal     Gram Stain       Value: NO ORGANISMS SEEN     NO WBC SEEN     CYTOSPIN PREP     Gram Stain Report Called to,Read Back By and Verified With: NEILSON,T/ED @0024  ON 06/26/12 BY KARCZEWSKI,S.   Report Status 06/26/2012 FINAL    GLUCOSE, CSF     Status: None   Collection Time    06/25/12 10:28 PM      Result Value Range   Glucose, CSF 71  43 - 76 mg/dL  PROTEIN, CSF     Status: None   Collection Time    06/25/12 10:28 PM      Result Value Range   Total  Protein, CSF 28  15 - 45 mg/dL  BLOOD GAS, ARTERIAL     Status: Abnormal   Collection Time    06/26/12 12:23 AM      Result Value Range   FIO2 0.21     Delivery systems ROOM AIR     pH, Arterial 7.372  7.350 - 7.450   pCO2 arterial 49.5 (*) 35.0 - 45.0 mmHg   pO2, Arterial  50.3 (*) 80.0 - 100.0 mmHg   Bicarbonate 28.1 (*) 20.0 - 24.0 mEq/L   TCO2 26.4  0 - 100 mmol/L   Acid-Base Excess 2.8 (*) 0.0 - 2.0 mmol/L   O2 Saturation 86.1     Patient temperature 98.6     Collection site RIGHT RADIAL     Drawn by [id number]     Sample type ARTERIAL     Allens test (pass/fail) PASS  PASS  CBC     Status: Abnormal   Collection Time    06/26/12  5:25 AM      Result Value Range   WBC 4.8  4.0 - 10.5 K/uL   RBC 3.16 (*) 3.87 - 5.11 MIL/uL   Hemoglobin 9.3 (*) 12.0 - 15.0 g/dL   Comment: DELTA CHECK NOTED     REPEATED TO VERIFY   HCT 27.6 (*) 36.0 - 46.0 %   MCV 87.3  78.0 - 100.0 fL   MCH 29.4  26.0 - 34.0 pg   MCHC 33.7  30.0 - 36.0 g/dL   RDW 81.1  91.4 - 78.2 %   Platelets 141 (*) 150 - 400 K/uL  BASIC METABOLIC PANEL     Status: Abnormal   Collection Time    06/26/12  5:25 AM      Result Value Range   Sodium 132 (*)  135 - 145 mEq/L   Potassium 3.5  3.5 - 5.1 mEq/L   Chloride 95 (*) 96 - 112 mEq/L   CO2 30  19 - 32 mEq/L   Glucose, Bld 92  70 - 99 mg/dL   BUN 5 (*) 6 - 23 mg/dL   Creatinine, Ser 9.56  0.50 - 1.10 mg/dL   Calcium 8.5  8.4 - 21.3 mg/dL   GFR calc non Af Amer 72 (*) >90 mL/min   GFR calc Af Amer 84 (*) >90 mL/min   Comment:            The eGFR has been calculated     using the CKD EPI equation.     This calculation has not been     validated in all clinical     situations.     eGFR's persistently     <90 mL/min signify     possible Chronic Kidney Disease.  VITAMIN B12     Status: None   Collection Time    06/26/12  3:24 PM      Result Value Range   Vitamin B-12 408  211 - 911 pg/mL  TROPONIN I     Status: None   Collection Time    06/26/12  3:24 PM      Result Value Range   Troponin I <0.30  <0.30 ng/mL   Comment:            Due to the release kinetics of cTnI,     a negative result within the first hours     of the onset of symptoms does not rule out     myocardial infarction with certainty.     If myocardial infarction is still suspected,     repeat the test at appropriate intervals.  RPR     Status: None   Collection Time    06/26/12  3:24 PM      Result Value Range   RPR NON REACTIVE  NON REACTIVE  AMMONIA     Status: None   Collection Time    06/26/12  3:25 PM  Result Value Range   Ammonia 25  11 - 60 umol/L  TROPONIN I     Status: None   Collection Time    06/26/12  8:18 PM      Result Value Range   Troponin I <0.30  <0.30 ng/mL   Comment:            Due to the release kinetics of cTnI,     a negative result within the first hours     of the onset of symptoms does not rule out     myocardial infarction with certainty.     If myocardial infarction is still suspected,     repeat the test at appropriate intervals.  TROPONIN I     Status: None   Collection Time    06/27/12  3:12 AM      Result Value Range   Troponin I <0.30  <0.30 ng/mL   Comment:             Due to the release kinetics of cTnI,     a negative result within the first hours     of the onset of symptoms does not rule out     myocardial infarction with certainty.     If myocardial infarction is still suspected,     repeat the test at appropriate intervals.  BASIC METABOLIC PANEL     Status: Abnormal   Collection Time    06/27/12  5:37 AM      Result Value Range   Sodium 134 (*) 135 - 145 mEq/L   Potassium 3.1 (*) 3.5 - 5.1 mEq/L   Chloride 97  96 - 112 mEq/L   CO2 29  19 - 32 mEq/L   Glucose, Bld 94  70 - 99 mg/dL   BUN 4 (*) 6 - 23 mg/dL   Creatinine, Ser 2.95  0.50 - 1.10 mg/dL   Calcium 8.8  8.4 - 62.1 mg/dL   GFR calc non Af Amer 85 (*) >90 mL/min   GFR calc Af Amer >90  >90 mL/min   Comment:            The eGFR has been calculated     using the CKD EPI equation.     This calculation has not been     validated in all clinical     situations.     eGFR's persistently     <90 mL/min signify     possible Chronic Kidney Disease.  CBC     Status: Abnormal   Collection Time    06/27/12  5:37 AM      Result Value Range   WBC 3.1 (*) 4.0 - 10.5 K/uL   RBC 3.41 (*) 3.87 - 5.11 MIL/uL   Hemoglobin 10.0 (*) 12.0 - 15.0 g/dL   HCT 30.8 (*) 65.7 - 84.6 %   MCV 85.9  78.0 - 100.0 fL   MCH 29.3  26.0 - 34.0 pg   MCHC 34.1  30.0 - 36.0 g/dL   RDW 96.2  95.2 - 84.1 %   Platelets 143 (*) 150 - 400 K/uL  URINE RAPID DRUG SCREEN (HOSP PERFORMED)     Status: Abnormal   Collection Time    06/27/12  5:39 AM      Result Value Range   Opiates NONE DETECTED  NONE DETECTED   Cocaine NONE DETECTED  NONE DETECTED   Benzodiazepines POSITIVE (*) NONE DETECTED   Amphetamines NONE DETECTED  NONE DETECTED   Tetrahydrocannabinol  NONE DETECTED  NONE DETECTED   Barbiturates NONE DETECTED  NONE DETECTED   Comment:            DRUG SCREEN FOR MEDICAL PURPOSES     ONLY.  IF CONFIRMATION IS NEEDED     FOR ANY PURPOSE, NOTIFY LAB     WITHIN 5 DAYS.                LOWEST  DETECTABLE LIMITS     FOR URINE DRUG SCREEN     Drug Class       Cutoff (ng/mL)     Amphetamine      1000     Barbiturate      200     Benzodiazepine   200     Tricyclics       300     Opiates          300     Cocaine          300     THC              50    Studies/Results: Dg Chest 2 View  06/25/2012   *RADIOLOGY REPORT*  Clinical Data: Altered mental status, shortness of breath and chest pain.  CHEST - 2 VIEW  Comparison: 06/06/2012  Findings: Heart is mildly enlarged.  No edema or infiltrate is seen.  No evidence of nodule or pleural fluid.  The bony thorax shows stable mild degenerative changes and minimal scoliosis of the thoracic spine.  IMPRESSION: Mild cardiomegaly.  No active disease.   Original Report Authenticated By: Irish Lack, M.D.   Ct Head Wo Contrast  06/25/2012   *RADIOLOGY REPORT*  Clinical Data: Altered mental status.  CT HEAD WITHOUT CONTRAST  Technique:  Contiguous axial images were obtained from the base of the skull through the vertex without contrast.  Comparison: None.  Findings: Minimally enlarged ventricles and subarachnoid spaces. Mild patchy white matter low density in both cerebral hemispheres. No intracranial hemorrhage, mass lesion or CT evidence of acute infarction.  Left sphenoid sinus mucosal thickening.  Bilateral maxillary sinus retention cysts.  IMPRESSION:  1.  No acute abnormality. 2.  Minimal atrophy and mild chronic small vessel white matter ischemic changes. 3.  Chronic left sphenoid sinusitis.   Original Report Authenticated By: Beckie Salts, M.D.   Mr Brain Wo Contrast  06/26/2012   *RADIOLOGY REPORT*  Clinical Data: Altered mental status.  Unsteady gait. Hypertension. Hypercholesterolemia. History of hyponatremia.  MRI HEAD WITHOUT CONTRAST  Technique:  Multiplanar, multiecho pulse sequences of the brain and surrounding structures were obtained according to standard protocol without intravenous contrast.  Comparison: 06/25/2012 head CT.  No  comparison brain MR.  Findings: No acute infarct.  Prominent patchy white matter type changes subcortical and periventricular region.  Patchy white matter type changes of the pons.  White matter changes felt most likely to be related to result of small vessel disease in this hypertensive patient with hypercholesterolemia. The pontine white matter type changes are felt unlikely to represent result of central pontine myelinolysis. There does not appear to be have been a rapid correction of low sodium recently upon review of labs.  Other causes of white matter type changes such as that secondary to vasculitis, inflammatory process or demyelinating process felt less likely possibilities.  No intracranial hemorrhage.  No hydrocephalus.  No intracranial mass lesion detected on this unenhanced exam.  Major intracranial vascular structures are patent.  Partial opacification left mastoid air cells.  No mass seen in the region of the posterior-superior nasopharynx contributing to the left eustachian tube dysfunction.  Paranasal sinus mucosal thickening most notable left sphenoid sinus air cell (polypoid opacification) and polypoid opacification inferior right maxillary sinus.  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  Cervical medullary junction, pituitary region, pineal region and orbital structures unremarkable.  IMPRESSION: No acute infarct.  Prominent white matter type changes most consistent with result of small vessel disease as detailed above.  Left mastoid air cell opacification and paranasal sinus opacification as detailed above.   Original Report Authenticated By: Lacy Duverney, M.D.    Medications:  I have reviewed the patient's current medications. Scheduled: . amLODipine  10 mg Oral Daily  . doxycycline  100 mg Oral Q12H  . FLUoxetine  40 mg Oral q morning - 10a  . heparin  5,000 Units Subcutaneous Q8H  . losartan  100 mg Oral Daily  . pantoprazole  40 mg Oral Daily  .  pneumococcal 23 valent vaccine  0.5 mL Intramuscular Tomorrow-1000  . sodium chloride  3 mL Intravenous Q12H  . tiotropium  18 mcg Inhalation Daily   Continuous: . sodium chloride 75 mL/hr at 06/27/12 0531   ZOX:[id number], ALPRAZolam, guaiFENesin, oxyCODONE  Assessment/Plan: Altered mental status with confusion and hallucinations, resolved. Etiology was most likely multifactorial including low-grade fever as well as hyponatremia and recent steroid treatment. No evidence of CNS infection was found. Patient's mental status is back to normal at this point. New  No further neurodiagnostic studies are indicated at this point. No further neurological intervention is indicated as well. I will sign off on her care at this point but remain available for followup evaluation if indicated.  C.R. Roseanne Reno, MD [facility] [phone]  06/27/2012  10:42 AM

## 2012-06-27 NOTE — Discharge Summary (Addendum)
Physician Discharge Summary  Allison Vaughan NUU:[id number] DOB: 1945/07/01 DOA: 06/25/2012  PCP: Coralee Rud, PA-C  Admit date: 06/25/2012 Discharge date: 06/27/2012  Recommendations for Outpatient Follow-up:  1. Pt will need to follow up with PCP in 2 weeks post discharge 2. Please obtain BMP to evaluate electrolytes and kidney function 3. Please also check CBC to evaluate Hg and Hct levels   Discharge Diagnoses:  Principal Problem:   COPD exacerbation Active Problems:   TOBACCO DEPENDENCE   HYPERTENSION, BENIGN SYSTEMIC   Chronic hypoxemic respiratory failure   COPD (chronic obstructive pulmonary disease)   Hypoxemia   Altered mental status Altered Mental Status  -Improved and back to baseline x24 hours prior to discharge. -likely multifactorial including hypoxemia, hypercarbia, hyponatremia, and opioids, xanax overuse -Patient reveals to the nursing staff that she had been taking more alprazolam then was prescribed  -check NH3--25 -UDS--positive for benzo  -B12--408,  -RBC folate--pending at time of discharge  -RPR--neg -CSF cell count does not suggest meningitis -MRI brain revealed small vessel disease without acute infarct. It did show left mastoid partial opacification.  -Urinalysis did not suggest UTI Chest Pain  -EKG reassuring  -cycle troponin--negative Hypoxemia  -Pt will need supplemental oxygen  -ambulatory pulseox--desat only to 96% on day of d/c -May be partly due to the patient's mental status due to the patient taking extra Xanax.  HTN  -BP remained controlled -decrease losartan to 100mg  once daily  -patient was taking this or not 100 mg twice a day  COPD  -continue spiriva  -add advair--pt state she is on this at home  -aerosolized albuterol prn  -does not appear decompensated  -Patient was initially started on doxycycline the time of admission because of increasing cough and sputum production. The patient will be continued for 6 additional  days to complete 7 days therapy at home. Deconditioning  -PT  Hyponatremia  -suspect degree of volume depletion  -TSH 0.424 on 06/07/12  -IVF  Family Communication: Daughter at beside  Disposition Plan: Home when medically stable  Antibiotics:  Doxycycline 06/26/12>>>   Discharge Condition: stable  Disposition: home   Diet:heart healthy Wt Readings from Last 3 Encounters:  06/26/12 64.501 kg (142 lb 3.2 oz)  06/07/12 61.689 kg (136 lb)    History of present illness:  67 year old female with a history of hypertension, depression, anxiety, COPD with continued tobacco use presented with three-day history of generalized weakness, altered mentation, and gait instability. The patient's daughter was at the bedside to supplement the history. The patient's daughter stated that the patient appeared to be drunk and off balance and occasionally the patient would speak about tangential at times they have never occurred. The patient denies taking any extra opioids. The patient's daughter states that she administers the opioids to the patient herself. The patient states that she has continued to have a cough with yellow sputum which is slightly worse for the past 3 days. She states her breathing is not a worse than usual, and in fact, she states that it is possibly a little bit better than usual. There's been no history of vomiting, diarrhea, abdominal pain, dysuria, rashes, fevers, chills. The patient does complain of chest discomfort intermittently. Of note, the patient was recently discharged from the hospital after a stay from 06/06/2012-06/09/2012 for COPD exacerbation. The patient was scheduled to see Dr. Sandrea Hughs, but never followed up. The patient continued to smoke up to one half pack per day. She has smoked one pack per day x50 years.  At the time of admission, the patient was noted to have a temperature 100.3 with a serum sodium 131. Her CBC was essentially unremarkable. The patient underwent  a lumbar puncture during this admission. CSF showed 2 WBCs, protein 28, glucose 71. ABG showed 7.32/49/50/28 on room air. Patient's mentation improved throughout the hospitalization. Her mentation returned to baseline. The patient participated with physical therapy. Physical therapy recommended home health physical therapy. This was set up with  case management. Patient losartan was decreased from 100 mg twice a day to once daily. During the hospitalization, it was discovered that the patient was taking extra alprazolam at home. She revealed that she was taking up to 4 alprazolam daily, which is more than the usual prescription dose.. This likely contributed to the patient's obtundation and hypoxemia at the time of admission. The patient was ambulated and oxygen saturation  drop to 89% initially. On the day of discharge, and that her pulse oximetry was obtained again. Patient oxygen dropped to 96%. The patient recovered up to 94% at rest. The patient remained clinically stable without any further desaturations at rest during the hospitalization. in addition, the patient also related a history that she had difficulty affording her medications, particularly her Spiriva and Advair. As a result, the patient was not using them as prescribed. This likely also contributed to the patient's respiratory issues.      Discharge Exam: Filed Vitals:   06/27/12 1021  BP: 121/55  Pulse:   Temp:   Resp:    Filed Vitals:   06/26/12 2011 06/27/12 0547 06/27/12 0756 06/27/12 1021  BP: 130/61 127/69  121/55  Pulse: 86 83    Temp: 99.1 F (37.3 C) 98.8 F (37.1 C)    TempSrc: Oral Oral    Resp: 20 20    Height:      Weight:      SpO2: 97% 95% 92%    General: A&O x 3, NAD, pleasant, cooperative Cardiovascular: RRR, no rub, no gallop, no S3 Respiratory: diminished BS at the bases. No wheezes or rhonchi. Good air movement.  Abdomen:soft, nontender, nondistended, positive bowel sounds Extremities: No edema, No  lymphangitis, no petechiae  Discharge Instructions  Discharge Orders   Future Appointments Provider Department Dept Phone   07/22/2012 3:00 PM Kalman Shan, MD [facility] [phone]   Future Orders Complete By Expires     Diet - low sodium heart healthy  As directed     Increase activity slowly  As directed         Medication List    TAKE these medications       albuterol 108 (90 BASE) MCG/ACT inhaler  Commonly known as:  PROVENTIL HFA;VENTOLIN HFA  Inhale 2 puffs into the lungs every 6 (six) hours as needed for wheezing.     ALPRAZolam 1 MG tablet  Commonly known as:  XANAX  Take 1 mg by mouth at bedtime as needed. For anxiety     amLODipine 10 MG tablet  Commonly known as:  NORVASC  Take 1 tablet (10 mg total) by mouth daily.     doxycycline 100 MG tablet  Commonly known as:  VIBRA-TABS  Take 1 tablet (100 mg total) by mouth every 12 (twelve) hours.     FLUoxetine 40 MG capsule  Commonly known as:  PROZAC  Take 40 mg by mouth every morning.     guaiFENesin 600 MG 12 hr tablet  Commonly known as:  MUCINEX  Take 1,200 mg by mouth 2 (  two) times daily as needed for congestion.     losartan 100 MG tablet  Commonly known as:  COZAAR  Take 1 tablet (100 mg total) by mouth 2 (two) times daily.     omeprazole 40 MG capsule  Commonly known as:  PRILOSEC  Take 40 mg by mouth daily.     Oxycodone HCl 10 MG Tabs  Take 10 mg by mouth 3 (three) times daily as needed. For pain     tiotropium 18 MCG inhalation capsule  Commonly known as:  SPIRIVA HANDIHALER  Place 1 capsule (18 mcg total) into inhaler and inhale daily.         The results of significant diagnostics from this hospitalization (including imaging, microbiology, ancillary and laboratory) are listed below for reference.    Significant Diagnostic Studies: Dg Chest 2 View  06/25/2012   *RADIOLOGY REPORT*  Clinical Data: Altered mental status, shortness of breath and chest pain.  CHEST - 2 VIEW   Comparison: 06/06/2012  Findings: Heart is mildly enlarged.  No edema or infiltrate is seen.  No evidence of nodule or pleural fluid.  The bony thorax shows stable mild degenerative changes and minimal scoliosis of the thoracic spine.  IMPRESSION: Mild cardiomegaly.  No active disease.   Original Report Authenticated By: Irish Lack, M.D.   Dg Chest 2 View  06/06/2012   *RADIOLOGY REPORT*  Clinical Data: Cough, congestion and shortness of breath; history of smoking.  CHEST - 2 VIEW  Comparison: Chest radiograph performed 03/05/2005  Findings: The lungs are well-aerated and clear.  There is no evidence of focal opacification, pleural effusion or pneumothorax.  The heart is normal in size; calcification is noted in the aortic arch.  No acute osseous abnormalities are seen.  IMPRESSION: No acute cardiopulmonary process seen.   Original Report Authenticated By: Tonia Ghent, M.D.   Ct Head Wo Contrast  06/25/2012   *RADIOLOGY REPORT*  Clinical Data: Altered mental status.  CT HEAD WITHOUT CONTRAST  Technique:  Contiguous axial images were obtained from the base of the skull through the vertex without contrast.  Comparison: None.  Findings: Minimally enlarged ventricles and subarachnoid spaces. Mild patchy white matter low density in both cerebral hemispheres. No intracranial hemorrhage, mass lesion or CT evidence of acute infarction.  Left sphenoid sinus mucosal thickening.  Bilateral maxillary sinus retention cysts.  IMPRESSION:  1.  No acute abnormality. 2.  Minimal atrophy and mild chronic small vessel white matter ischemic changes. 3.  Chronic left sphenoid sinusitis.   Original Report Authenticated By: Beckie Salts, M.D.   Mr Brain Wo Contrast  06/26/2012   *RADIOLOGY REPORT*  Clinical Data: Altered mental status.  Unsteady gait. Hypertension. Hypercholesterolemia. History of hyponatremia.  MRI HEAD WITHOUT CONTRAST  Technique:  Multiplanar, multiecho pulse sequences of the brain and surrounding  structures were obtained according to standard protocol without intravenous contrast.  Comparison: 06/25/2012 head CT.  No comparison brain MR.  Findings: No acute infarct.  Prominent patchy white matter type changes subcortical and periventricular region.  Patchy white matter type changes of the pons.  White matter changes felt most likely to be related to result of small vessel disease in this hypertensive patient with hypercholesterolemia. The pontine white matter type changes are felt unlikely to represent result of central pontine myelinolysis. There does not appear to be have been a rapid correction of low sodium recently upon review of labs.  Other causes of white matter type changes such as that secondary to vasculitis, inflammatory process  or demyelinating process felt less likely possibilities.  No intracranial hemorrhage.  No hydrocephalus.  No intracranial mass lesion detected on this unenhanced exam.  Major intracranial vascular structures are patent.  Partial opacification left mastoid air cells.  No mass seen in the region of the posterior-superior nasopharynx contributing to the left eustachian tube dysfunction.  Paranasal sinus mucosal thickening most notable left sphenoid sinus air cell (polypoid opacification) and polypoid opacification inferior right maxillary sinus.  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  Cervical medullary junction, pituitary region, pineal region and orbital structures unremarkable.  IMPRESSION: No acute infarct.  Prominent white matter type changes most consistent with result of small vessel disease as detailed above.  Left mastoid air cell opacification and paranasal sinus opacification as detailed above.   Original Report Authenticated By: Lacy Duverney, M.D.     Microbiology: Recent Results (from the past 240 hour(s))  CSF CULTURE     Status: None   Collection Time    06/25/12 10:28 PM      Result Value Range Status   Specimen  Description CSF   Final   Special Requests Normal   Final   Gram Stain     Final   Value: CYTOSPIN SLIDE NO WBC SEEN     NO ORGANISMS SEEN     Gram Stain Report Called to,Read Back By and Verified With: Gram Stain Report Called to,Read Back By and Verified With: NEILSONT ED 0024 06/26/12 BY Governor Specking S Performed at [facility]   Culture PENDING   Incomplete   Report Status PENDING   Incomplete  GRAM STAIN     Status: None   Collection Time    06/25/12 10:28 PM      Result Value Range Status   Specimen Description CSF   Final   Special Requests Normal   Final   Gram Stain     Final   Value: NO ORGANISMS SEEN     NO WBC SEEN     CYTOSPIN PREP     Gram Stain Report Called to,Read Back By and Verified With: NEILSON,T/ED @0024  ON 06/26/12 BY KARCZEWSKI,S.   Report Status 06/26/2012 FINAL   Final     Labs: Basic Metabolic Panel:  Recent Labs Lab 06/25/12 1745 06/26/12 0525 06/27/12 0537  NA 131* 132* 134*  K 3.6 3.5 3.1*  CL 93* 95* 97  CO2 29 30 29   GLUCOSE 102* 92 94  BUN 8 5* 4*  CREATININE 0.92 0.81 0.74  CALCIUM 8.8 8.5 8.8   Liver Function Tests:  Recent Labs Lab 06/25/12 1745  AST 15  ALT 9  ALKPHOS 128*  BILITOT 0.7  PROT 6.9  ALBUMIN 3.2*   No results found for this basename: LIPASE, AMYLASE,  in the last 168 hours  Recent Labs Lab 06/26/12 1525  AMMONIA 25   CBC:  Recent Labs Lab 06/25/12 1745 06/26/12 0525 06/27/12 0537  WBC 8.0 4.8 3.1*  HGB 11.2* 9.3* 10.0*  HCT 32.2* 27.6* 29.3*  MCV 88.0 87.3 85.9  PLT 178 141* 143*   Cardiac Enzymes:  Recent Labs Lab 06/26/12 1524 06/26/12 2018 06/27/12 0312  TROPONINI <0.30 <0.30 <0.30   BNP: No components found with this basename: POCBNP,  CBG:  Recent Labs Lab 06/25/12 1946  GLUCAP 105*    Time coordinating discharge:  Greater than 30 minutes  Signed:  Aalayah Riles, DO [facility] Pager: [phone] 06/27/2012, 11:35 AM

## 2012-06-29 LAB — CSF CULTURE W GRAM STAIN: Gram Stain: NONE SEEN

## 2012-06-29 LAB — FOLATE RBC: RBC Folate: 628 ng/mL — ABNORMAL HIGH (ref >=366)

## 2012-06-30 LAB — HERPES SIMPLEX VIRUS(HSV) DNA BY PCR: HSV 1 DNA: NOT DETECTED

## 2012-07-04 ENCOUNTER — Emergency Department (HOSPITAL_COMMUNITY)
Admission: EM | Admit: 2012-07-04 | Discharge: 2012-07-04 | Disposition: A | Discharge status: Home / Self Care | Payer: Medicare Other | Attending: Emergency Medicine | Admitting: Emergency Medicine

## 2012-07-04 ENCOUNTER — Encounter (HOSPITAL_COMMUNITY): Payer: Self-pay | Admitting: *Deleted

## 2012-07-04 ENCOUNTER — Emergency Department (HOSPITAL_COMMUNITY): Payer: Medicare Other

## 2012-07-04 DIAGNOSIS — F411 Generalized anxiety disorder: Secondary | ICD-10-CM | POA: Insufficient documentation

## 2012-07-04 DIAGNOSIS — G8929 Other chronic pain: Secondary | ICD-10-CM | POA: Insufficient documentation

## 2012-07-04 DIAGNOSIS — J449 Chronic obstructive pulmonary disease, unspecified: Secondary | ICD-10-CM | POA: Insufficient documentation

## 2012-07-04 DIAGNOSIS — S2232XA Fracture of one rib, left side, initial encounter for closed fracture: Secondary | ICD-10-CM

## 2012-07-04 DIAGNOSIS — Z79899 Other long term (current) drug therapy: Secondary | ICD-10-CM | POA: Insufficient documentation

## 2012-07-04 DIAGNOSIS — S2249XA Multiple fractures of ribs, unspecified side, initial encounter for closed fracture: Secondary | ICD-10-CM | POA: Insufficient documentation

## 2012-07-04 DIAGNOSIS — Y9289 Other specified places as the place of occurrence of the external cause: Secondary | ICD-10-CM | POA: Insufficient documentation

## 2012-07-04 DIAGNOSIS — Y9389 Activity, other specified: Secondary | ICD-10-CM | POA: Insufficient documentation

## 2012-07-04 DIAGNOSIS — F17200 Nicotine dependence, unspecified, uncomplicated: Secondary | ICD-10-CM | POA: Insufficient documentation

## 2012-07-04 DIAGNOSIS — I10 Essential (primary) hypertension: Secondary | ICD-10-CM | POA: Insufficient documentation

## 2012-07-04 DIAGNOSIS — IMO0002 Reserved for concepts with insufficient information to code with codable children: Secondary | ICD-10-CM | POA: Insufficient documentation

## 2012-07-04 DIAGNOSIS — J4489 Other specified chronic obstructive pulmonary disease: Secondary | ICD-10-CM | POA: Insufficient documentation

## 2012-07-04 DIAGNOSIS — W1809XA Striking against other object with subsequent fall, initial encounter: Secondary | ICD-10-CM | POA: Insufficient documentation

## 2012-07-04 DIAGNOSIS — M542 Cervicalgia: Secondary | ICD-10-CM | POA: Insufficient documentation

## 2012-07-04 MED ORDER — OXYCODONE-ACETAMINOPHEN 5-325 MG PO TABS
2.0000 | ORAL_TABLET | Freq: Once | ORAL | Status: AC
  Administered 2012-07-04: 2 via ORAL
  Filled 2012-07-04: qty 2

## 2012-07-04 MED ORDER — OXYCODONE HCL 5 MG PO TABS: 5.0000 mg | ORAL_TABLET | Freq: Four times a day (QID) | ORAL | Status: DC | PRN

## 2012-07-04 NOTE — ED Notes (Signed)
ZOX:[id number]<AV> Expected date:07/04/12<BR> Expected time: 5:46 PM<BR> Means of arrival:Ambulance<BR> Comments:<BR> Fall/left chest pain from fall

## 2012-07-04 NOTE — ED Provider Notes (Signed)
History     CSN: [id number]  Arrival date & time 07/04/12  1758   First MD Initiated Contact with Patient 07/04/12 1815      Chief Complaint  Patient presents with  . Fall  . Rib Injury    (Consider location/radiation/quality/duration/timing/severity/associated sxs/prior treatment) Patient is a 67 y.o. female presenting with fall. The history is provided by the patient.  Fall Associated symptoms include chest pain. Pertinent negatives include no abdominal pain and no headaches.   patient with fall prior to arrival. [place] into the CAT scratch post striking her left rib cage. Pain to that area. No other injuries. Patient has a history of chronic neck pain but has not worsened was no loss of consciousness no bowel pain no anterior chest pain. No extremity pain. C-collar was removed by me patient had full range of motion of the neck no posterior tenderness along the cervical spine. Patient cleared to clinically.  Past Medical History  Diagnosis Date  . Hypertension   . Chronic pain   . Anxiety   . COPD (chronic obstructive pulmonary disease)     Past Surgical History  Procedure Laterality Date  . Eye surgery      No family history on file.  History  Substance Use Topics  . Smoking status: Current Every Day Smoker -- 1.00 packs/day    Types: Cigarettes  . Smokeless tobacco: Not on file  . Alcohol Use: 1.2 oz/week    2 Cans of beer per week     Comment: per week    OB History   Grav Para Term Preterm Abortions TAB SAB Ect Mult Living                  Review of Systems  Constitutional: Negative for fever.  HENT: Positive for neck pain.   Eyes: Negative for visual disturbance.  Cardiovascular: Positive for chest pain.  Gastrointestinal: Negative for abdominal pain.  Genitourinary: Negative for dysuria.  Musculoskeletal: Positive for back pain.  Skin: Negative for rash.  Neurological: Negative for headaches.  Hematological: Bruises/bleeds easily.   Psychiatric/Behavioral: Negative for confusion.    Allergies  Review of patient's allergies indicates no known allergies.  Home Medications   Current Outpatient Rx  Name  Route  Sig  Dispense  Refill  . albuterol (PROVENTIL HFA;VENTOLIN HFA) 108 (90 BASE) MCG/ACT inhaler   Inhalation   Inhale 2 puffs into the lungs every 6 (six) hours as needed for wheezing.   1 Inhaler   2   . ALPRAZolam (XANAX) 1 MG tablet   Oral   Take 1 mg by mouth at bedtime as needed. For anxiety         . amLODipine (NORVASC) 10 MG tablet   Oral   Take 1 tablet (10 mg total) by mouth daily.   30 tablet   0   . doxycycline (VIBRA-TABS) 100 MG tablet   Oral   Take 1 tablet (100 mg total) by mouth every 12 (twelve) hours.   12 tablet   0   . FLUoxetine (PROZAC) 40 MG capsule   Oral   Take 40 mg by mouth every morning.          Marland Kitchen guaiFENesin (MUCINEX) 600 MG 12 hr tablet   Oral   Take 1,200 mg by mouth 2 (two) times daily as needed for congestion.         Marland Kitchen omeprazole (PRILOSEC) 40 MG capsule   Oral   Take 40 mg by mouth daily.          Marland Kitchen  Oxycodone HCl 10 MG TABS   Oral   Take 10 mg by mouth 3 (three) times daily as needed. For pain         . tiotropium (SPIRIVA HANDIHALER) 18 MCG inhalation capsule   Inhalation   Place 1 capsule (18 mcg total) into inhaler and inhale daily.   30 capsule   12   . oxyCODONE (ROXICODONE) 5 MG immediate release tablet   Oral   Take 1 tablet (5 mg total) by mouth every 6 (six) hours as needed for pain.   30 tablet   0     BP 108/61  Pulse 74  Temp(Src) 98.8 F (37.1 C) (Oral)  Resp 24  SpO2 96%  Physical Exam  Nursing note and vitals reviewed. Constitutional: She is oriented to person, place, and time. She appears well-developed and well-nourished. No distress.  HENT:  Head: Normocephalic and atraumatic.  Mouth/Throat: Oropharynx is clear and moist.  Eyes: Conjunctivae and EOM are normal. Pupils are equal, round, and reactive to  light.  Neck: Normal range of motion. Neck supple.  Despite history of neck pain patient has full range of motion to the neck and no tenderness to palpation along the cervical spine.  Cardiovascular: Normal rate and regular rhythm.   Pulmonary/Chest: Effort normal and breath sounds normal. No respiratory distress. She has no wheezes. She exhibits tenderness.  Tenderness to palpation along the left lateral chest wall. No crepitance.  Abdominal: Soft. Bowel sounds are normal. There is no tenderness.  Musculoskeletal: Normal range of motion.  : Bruising to the back of the left hand from her previous IV site some deformity at the wrist the patient states that is from an old injury.  Neurological: She is alert and oriented to person, place, and time. No cranial nerve deficit. She exhibits normal muscle tone. Coordination normal.  Skin: Skin is warm.    ED Course  Procedures (including critical care time)  Labs Reviewed - No data to display Dg Ribs Unilateral W/chest Left  07/04/2012   *RADIOLOGY REPORT*  Clinical Data: Status post fall.  Left axillary pain.  LEFT RIBS AND CHEST - 3+ VIEW  Comparison: 06/25/2012.  Findings: Cardiomegaly.  No active infiltrates or failure.    No effusion or pneumothorax.  Calcified tortuous aorta.  The bones are osteopenic, making detection of rib fracture difficult.  I cannot completely exclude nondisplaced rib fractures of the left eighth and ninth ribs laterally and posterolaterally.  IMPRESSION: Cardiomegaly, no active disease.  Possible nondisplaced left eighth and ninth rib fracture. No effusion or pneumothorax.   Original Report Authenticated By: Davonna Belling, M.D.     1. Rib fractures, left, closed, initial encounter       MDM  Patient history of COPD. Room air sats are 96%. Patient had a fall earlier this evening striking her left rib cage. Chest x-ray and rib series shows a nondisplaced fractures of the eighth and ninth rib. No underlying trauma to the  long no pneumothorax no effusions no evidence of pulmonary contusion. Patient improved in the emergency probably pain medication will discharge home. Will recommend patient use her the overall inhaler on a regular basis and followup with her regular Dr. in the next few days. No other injuries.        Shelda Jakes, MD 07/04/12 2126

## 2012-07-04 NOTE — ED Notes (Signed)
PNA/Flu screen was ordered inadvertently and was not done.

## 2012-07-04 NOTE — ED Notes (Signed)
Pt fell while bending over, fell on cath scratch post, left rib pain with ? Deformity. Has history chronic neck pain and joint pain. No LOC, C-collar placed

## 2012-07-22 ENCOUNTER — Institutional Professional Consult (permissible substitution): Payer: Medicare Other | Admitting: Internal Medicine

## 2012-07-27 ENCOUNTER — Encounter: Payer: Self-pay | Admitting: Internal Medicine

## 2012-08-24 ENCOUNTER — Other Ambulatory Visit (HOSPITAL_COMMUNITY): Payer: Self-pay | Admitting: Internal Medicine

## 2012-08-24 DIAGNOSIS — Z1231 Encounter for screening mammogram for malignant neoplasm of breast: Secondary | ICD-10-CM

## 2012-08-31 ENCOUNTER — Ambulatory Visit (HOSPITAL_COMMUNITY)
Admission: RE | Admit: 2012-08-31 | Discharge: 2012-08-31 | Disposition: A | Discharge status: Home / Self Care | Payer: Medicare Other | Source: Ambulatory Visit | Attending: Internal Medicine | Admitting: Internal Medicine

## 2012-08-31 DIAGNOSIS — Z1231 Encounter for screening mammogram for malignant neoplasm of breast: Secondary | ICD-10-CM | POA: Insufficient documentation

## 2013-11-15 ENCOUNTER — Other Ambulatory Visit (HOSPITAL_COMMUNITY): Payer: Self-pay | Admitting: Internal Medicine

## 2013-11-15 DIAGNOSIS — Z1231 Encounter for screening mammogram for malignant neoplasm of breast: Secondary | ICD-10-CM

## 2013-11-30 ENCOUNTER — Ambulatory Visit (HOSPITAL_COMMUNITY): Payer: Medicare Other

## 2014-04-15 ENCOUNTER — Inpatient Hospital Stay (HOSPITAL_COMMUNITY)
Admission: EM | Admit: 2014-04-15 | Discharge: 2014-04-20 | DRG: 178 | Disposition: A | Discharge status: Home / Self Care | Payer: Medicare Other | Attending: Internal Medicine | Admitting: Internal Medicine

## 2014-04-15 ENCOUNTER — Emergency Department (HOSPITAL_COMMUNITY): Payer: Medicare Other

## 2014-04-15 ENCOUNTER — Encounter (HOSPITAL_COMMUNITY): Payer: Self-pay | Admitting: Emergency Medicine

## 2014-04-15 DIAGNOSIS — I10 Essential (primary) hypertension: Secondary | ICD-10-CM | POA: Diagnosis present

## 2014-04-15 DIAGNOSIS — F17210 Nicotine dependence, cigarettes, uncomplicated: Secondary | ICD-10-CM | POA: Diagnosis present

## 2014-04-15 DIAGNOSIS — Z801 Family history of malignant neoplasm of trachea, bronchus and lung: Secondary | ICD-10-CM

## 2014-04-15 DIAGNOSIS — R918 Other nonspecific abnormal finding of lung field: Secondary | ICD-10-CM

## 2014-04-15 DIAGNOSIS — W19XXXA Unspecified fall, initial encounter: Secondary | ICD-10-CM

## 2014-04-15 DIAGNOSIS — R911 Solitary pulmonary nodule: Secondary | ICD-10-CM | POA: Diagnosis present

## 2014-04-15 DIAGNOSIS — Z79899 Other long term (current) drug therapy: Secondary | ICD-10-CM

## 2014-04-15 DIAGNOSIS — J9801 Acute bronchospasm: Secondary | ICD-10-CM | POA: Diagnosis present

## 2014-04-15 DIAGNOSIS — R197 Diarrhea, unspecified: Secondary | ICD-10-CM | POA: Diagnosis present

## 2014-04-15 DIAGNOSIS — J690 Pneumonitis due to inhalation of food and vomit: Principal | ICD-10-CM | POA: Diagnosis present

## 2014-04-15 DIAGNOSIS — R112 Nausea with vomiting, unspecified: Secondary | ICD-10-CM | POA: Diagnosis present

## 2014-04-15 DIAGNOSIS — J441 Chronic obstructive pulmonary disease with (acute) exacerbation: Secondary | ICD-10-CM | POA: Diagnosis present

## 2014-04-15 DIAGNOSIS — J189 Pneumonia, unspecified organism: Secondary | ICD-10-CM | POA: Diagnosis present

## 2014-04-15 DIAGNOSIS — G8929 Other chronic pain: Secondary | ICD-10-CM | POA: Diagnosis present

## 2014-04-15 DIAGNOSIS — N179 Acute kidney failure, unspecified: Secondary | ICD-10-CM | POA: Diagnosis present

## 2014-04-15 DIAGNOSIS — J159 Unspecified bacterial pneumonia: Secondary | ICD-10-CM | POA: Diagnosis present

## 2014-04-15 DIAGNOSIS — J449 Chronic obstructive pulmonary disease, unspecified: Secondary | ICD-10-CM | POA: Diagnosis present

## 2014-04-15 DIAGNOSIS — I959 Hypotension, unspecified: Secondary | ICD-10-CM | POA: Diagnosis present

## 2014-04-15 DIAGNOSIS — E876 Hypokalemia: Secondary | ICD-10-CM | POA: Diagnosis present

## 2014-04-15 DIAGNOSIS — B349 Viral infection, unspecified: Secondary | ICD-10-CM | POA: Diagnosis present

## 2014-04-15 HISTORY — DX: Irritable bowel syndrome without diarrhea: K58.9

## 2014-04-15 HISTORY — DX: Unspecified osteoarthritis, unspecified site: M19.90

## 2014-04-15 HISTORY — DX: Nicotine dependence, unspecified, uncomplicated: F17.200

## 2014-04-15 HISTORY — DX: Chronic respiratory failure with hypoxia: J96.11

## 2014-04-15 HISTORY — DX: Major depressive disorder, single episode, unspecified: F32.9

## 2014-04-15 HISTORY — DX: Gastro-esophageal reflux disease without esophagitis: K21.9

## 2014-04-15 HISTORY — DX: Pneumonia, unspecified organism: J18.9

## 2014-04-15 HISTORY — DX: Pure hypercholesterolemia, unspecified: E78.00

## 2014-04-15 HISTORY — DX: Solitary pulmonary nodule: R91.1

## 2014-04-15 HISTORY — DX: Menopausal and female climacteric states: N95.1

## 2014-04-15 HISTORY — DX: Essential (primary) hypertension: I10

## 2014-04-15 LAB — URINALYSIS, ROUTINE W REFLEX MICROSCOPIC
Glucose, UA: NEGATIVE mg/dL
Hgb urine dipstick: NEGATIVE
Ketones, ur: NEGATIVE mg/dL
LEUKOCYTES UA: NEGATIVE
NITRITE: NEGATIVE
PROTEIN: NEGATIVE mg/dL
Specific Gravity, Urine: 1.018 (ref 1.005–1.030)
UROBILINOGEN UA: 0.2 mg/dL (ref 0.0–1.0)
pH: 6 (ref 5.0–8.0)

## 2014-04-15 LAB — COMPREHENSIVE METABOLIC PANEL
ALK PHOS: 115 U/L (ref 39–117)
ALT: 11 U/L (ref 0–35)
AST: 19 U/L (ref 0–37)
Albumin: 3.7 g/dL (ref 3.5–5.2)
Anion gap: 13 (ref 5–15)
BILIRUBIN TOTAL: 0.4 mg/dL (ref 0.3–1.2)
BUN: 29 mg/dL — AB (ref 6–23)
CHLORIDE: 92 mmol/L — AB (ref 96–112)
CO2: 26 mmol/L (ref 19–32)
Calcium: 8.9 mg/dL (ref 8.4–10.5)
Creatinine, Ser: 2.65 mg/dL — ABNORMAL HIGH (ref 0.50–1.10)
GFR calc non Af Amer: 17 mL/min — ABNORMAL LOW (ref >90)
GFR, EST AFRICAN AMERICAN: 20 mL/min — AB (ref >90)
GLUCOSE: 130 mg/dL — AB (ref 70–99)
POTASSIUM: 2.8 mmol/L — AB (ref 3.5–5.1)
SODIUM: 131 mmol/L — AB (ref 135–145)
TOTAL PROTEIN: 6.9 g/dL (ref 6.0–8.3)

## 2014-04-15 LAB — CBC WITH DIFFERENTIAL/PLATELET
BASOS ABS: 0 10*3/uL (ref 0.0–0.1)
BASOS PCT: 0 % (ref 0–1)
EOS ABS: 0 10*3/uL (ref 0.0–0.7)
Eosinophils Relative: 0 % (ref 0–5)
HCT: 36.2 % (ref 36.0–46.0)
Hemoglobin: 12.7 g/dL (ref 12.0–15.0)
Lymphocytes Relative: 29 % (ref 12–46)
Lymphs Abs: 1.6 10*3/uL (ref 0.7–4.0)
MCH: 30.2 pg (ref 26.0–34.0)
MCHC: 35.1 g/dL (ref 30.0–36.0)
MCV: 86 fL (ref 78.0–100.0)
MONO ABS: 0.5 10*3/uL (ref 0.1–1.0)
MONOS PCT: 9 % (ref 3–12)
NEUTROS ABS: 3.5 10*3/uL (ref 1.7–7.7)
NEUTROS PCT: 62 % (ref 43–77)
Platelets: 239 10*3/uL (ref 150–400)
RBC: 4.21 MIL/uL (ref 3.87–5.11)
RDW: 13.1 % (ref 11.5–15.5)
WBC: 5.7 10*3/uL (ref 4.0–10.5)

## 2014-04-15 LAB — LIPASE, BLOOD: LIPASE: 14 U/L (ref 11–59)

## 2014-04-15 LAB — TROPONIN I: Troponin I: <0.03 ng/mL (ref <0.031)

## 2014-04-15 MED ORDER — SODIUM CHLORIDE 0.9 % IV SOLN
1000.0000 mL | Freq: Once | INTRAVENOUS | Status: AC
  Administered 2014-04-15: 1000 mL via INTRAVENOUS

## 2014-04-15 MED ORDER — IPRATROPIUM-ALBUTEROL 0.5-2.5 (3) MG/3ML IN SOLN
3.0000 mL | Freq: Once | RESPIRATORY_TRACT | Status: AC
  Administered 2014-04-15: 3 mL via RESPIRATORY_TRACT
  Filled 2014-04-15: qty 3

## 2014-04-15 MED ORDER — ONDANSETRON HCL 4 MG/2ML IJ SOLN
4.0000 mg | Freq: Once | INTRAMUSCULAR | Status: AC
  Administered 2014-04-15: 4 mg via INTRAVENOUS
  Filled 2014-04-15: qty 2

## 2014-04-15 NOTE — ED Notes (Signed)
Patient transported to CT 

## 2014-04-15 NOTE — ED Provider Notes (Signed)
CSN: [id number]     Arrival date & time 04/15/14  1930 History   First MD Initiated Contact with Patient 04/15/14 2029     Chief Complaint  Patient presents with  . flu symptoms      The history is provided by the patient. No language interpreter was used.   Allison Vaughan presents for evaluation of flulike symptoms. She reports 1 week of subjective fevers at home, last episode last night. She's had a week of vomiting and diarrhea. This is described as profuse vomiting following any attempt at oral intake with multiple episodes of watery stools. She has generalized abdominal discomfort. She also endorses increased cough and shortness of breath. She contacted her primary care provider regarding these symptoms and was started on an antibiotic over the phone. Symptoms are moderate, constant, worsening.  Past Medical History  Diagnosis Date  . Hypertension   . Chronic pain   . Anxiety   . COPD (chronic obstructive pulmonary disease)    Past Surgical History  Procedure Laterality Date  . Eye surgery     History reviewed. No pertinent family history. History  Substance Use Topics  . Smoking status: Current Every Day Smoker -- 1.00 packs/day    Types: Cigarettes  . Smokeless tobacco: Not on file  . Alcohol Use: 1.2 oz/week    2 Cans of beer per week     Comment: per week   OB History    No data available     Review of Systems  All other systems reviewed and are negative.     Allergies  Review of patient's allergies indicates no known allergies.  Home Medications   Prior to Admission medications   Medication Sig Start Date End Date Taking? Authorizing Provider  albuterol (PROVENTIL HFA;VENTOLIN HFA) 108 (90 BASE) MCG/ACT inhaler Inhale 2 puffs into the lungs every 6 (six) hours as needed for wheezing. 06/09/12  Yes Monika Salk, MD  ALPRAZolam Duanne Moron) 1 MG tablet Take 1 mg by mouth 3 (three) times daily as needed for anxiety or sleep. For anxiety   Yes Historical Provider, MD   atenolol (TENORMIN) 50 MG tablet Take 50 mg by mouth daily.   Yes Historical Provider, MD  FLUoxetine (PROZAC) 40 MG capsule Take 40 mg by mouth every morning.    Yes Historical Provider, MD  hydrALAZINE (APRESOLINE) 100 MG tablet Take 1 tablet by mouth 3 (three) times daily. 03/23/14  Yes Historical Provider, MD  hydrochlorothiazide (HYDRODIURIL) 25 MG tablet Take 25 mg by mouth daily.   Yes Historical Provider, MD  losartan (COZAAR) 100 MG tablet Take 1 tablet by mouth daily. 04/08/14  Yes Historical Provider, MD  Oxycodone HCl 10 MG TABS Take 10 mg by mouth 3 (three) times daily as needed. For pain   Yes Historical Provider, MD  sulfamethoxazole-trimethoprim (BACTRIM DS,SEPTRA DS) 800-160 MG per tablet Take 1 tablet by mouth 2 (two) times daily.   Yes Historical Provider, MD  tiotropium (SPIRIVA HANDIHALER) 18 MCG inhalation capsule Place 1 capsule (18 mcg total) into inhaler and inhale daily. 06/09/12  Yes Monika Salk, MD  amLODipine (NORVASC) 10 MG tablet Take 1 tablet (10 mg total) by mouth daily. 06/09/12   Monika Salk, MD  doxycycline (VIBRA-TABS) 100 MG tablet Take 1 tablet (100 mg total) by mouth every 12 (twelve) hours. Patient not taking: Reported on 04/15/2014 06/27/12   Orson Eva, MD  guaiFENesin (MUCINEX) 600 MG 12 hr tablet Take 1,200 mg by mouth 2 (two) times  daily as needed for congestion.    Historical Provider, MD  omeprazole (PRILOSEC) 40 MG capsule Take 40 mg by mouth daily.     Historical Provider, MD  oxyCODONE (ROXICODONE) 5 MG immediate release tablet Take 1 tablet (5 mg total) by mouth every 6 (six) hours as needed for pain. Patient not taking: Reported on 04/15/2014 07/04/12   Fredia Sorrow, MD   BP 93/48 mmHg  Pulse 63  Temp(Src) 97.7 F (36.5 C) (Oral)  Resp 20  SpO2 97% Physical Exam  Constitutional: She is oriented to person, place, and time. She appears well-developed and well-nourished.  HENT:  Head: Normocephalic and atraumatic.  Cardiovascular: Normal rate and  regular rhythm.   No murmur heard. Pulmonary/Chest: Effort normal. No respiratory distress.  Bilateral rhonchi  Abdominal: Soft. There is no tenderness. There is no rebound and no guarding.  Musculoskeletal: She exhibits no edema or tenderness.  Neurological: She is alert and oriented to person, place, and time.  Skin: Skin is warm and dry.  Psychiatric: She has a normal mood and affect. Her behavior is normal.  Nursing note and vitals reviewed.   ED Course  Procedures (including critical care time) Labs Review Labs Reviewed  COMPREHENSIVE METABOLIC PANEL - Abnormal; Notable for the following:    Sodium 131 (*)    Potassium 2.8 (*)    Chloride 92 (*)    Glucose, Bld 130 (*)    BUN 29 (*)    Creatinine, Ser 2.65 (*)    GFR calc non Af Amer 17 (*)    GFR calc Af Amer 20 (*)    All other components within normal limits  URINALYSIS, ROUTINE W REFLEX MICROSCOPIC - Abnormal; Notable for the following:    Bilirubin Urine SMALL (*)    All other components within normal limits  CBC WITH DIFFERENTIAL/PLATELET  LIPASE, BLOOD  TROPONIN I    Imaging Review Dg Chest 2 View  04/15/2014   CLINICAL DATA:  Cough.  Congestion.  Shortness of breath.  EXAM: CHEST  2 VIEW  COMPARISON:  07/04/2012  FINDINGS: Mild hyperinflation. Lateral view degraded by patient arm position. Midline trachea. Mild cardiomegaly with a tortuous thoracic aorta. No pleural effusion or pneumothorax. Mild interstitial thickening. No lobar consolidation. S-shaped thoracolumbar spine curvature.  IMPRESSION: Cardiomegaly and mild COPD/chronic bronchitis. No acute superimposed process.   Electronically Signed   By: Abigail Miyamoto M.D.   On: 04/15/2014 22:00     EKG Interpretation None      MDM   Final diagnoses:  Acute renal failure, unspecified acute renal failure type    Patient here for 1 week of vomiting and diarrhea. BMP demonstrates acute renal failure. Patient with mild hypotension on initial ED arrival, blood  pressure improved with IV fluid hydration. Plan admit to medicine for acute renal failure in the setting of vomiting and diarrhea.  Quintella Reichert, MD 04/16/14 [phone]

## 2014-04-15 NOTE — ED Notes (Signed)
Pt reports flu went around her home and she has been unable to recover from it for the past week. Pt reports nausea, vomiting and diarrhea for a week. Pt blood pressure low 81/45 and pt appears pale.

## 2014-04-15 NOTE — ED Notes (Signed)
Pt reports black diarrhea.

## 2014-04-15 NOTE — ED Notes (Signed)
Nurse starting IV 

## 2014-04-16 ENCOUNTER — Encounter (HOSPITAL_COMMUNITY): Payer: Self-pay | Admitting: Internal Medicine

## 2014-04-16 DIAGNOSIS — J42 Unspecified chronic bronchitis: Secondary | ICD-10-CM

## 2014-04-16 DIAGNOSIS — R197 Diarrhea, unspecified: Secondary | ICD-10-CM

## 2014-04-16 DIAGNOSIS — F17210 Nicotine dependence, cigarettes, uncomplicated: Secondary | ICD-10-CM | POA: Diagnosis present

## 2014-04-16 DIAGNOSIS — E876 Hypokalemia: Secondary | ICD-10-CM | POA: Diagnosis present

## 2014-04-16 DIAGNOSIS — J690 Pneumonitis due to inhalation of food and vomit: Secondary | ICD-10-CM | POA: Diagnosis present

## 2014-04-16 DIAGNOSIS — J441 Chronic obstructive pulmonary disease with (acute) exacerbation: Secondary | ICD-10-CM | POA: Diagnosis present

## 2014-04-16 DIAGNOSIS — I959 Hypotension, unspecified: Secondary | ICD-10-CM | POA: Diagnosis present

## 2014-04-16 DIAGNOSIS — N179 Acute kidney failure, unspecified: Secondary | ICD-10-CM | POA: Diagnosis present

## 2014-04-16 DIAGNOSIS — G8929 Other chronic pain: Secondary | ICD-10-CM | POA: Diagnosis present

## 2014-04-16 DIAGNOSIS — R911 Solitary pulmonary nodule: Secondary | ICD-10-CM | POA: Diagnosis present

## 2014-04-16 DIAGNOSIS — R112 Nausea with vomiting, unspecified: Secondary | ICD-10-CM | POA: Diagnosis not present

## 2014-04-16 DIAGNOSIS — I10 Essential (primary) hypertension: Secondary | ICD-10-CM | POA: Diagnosis present

## 2014-04-16 DIAGNOSIS — J189 Pneumonia, unspecified organism: Secondary | ICD-10-CM | POA: Diagnosis not present

## 2014-04-16 DIAGNOSIS — Z801 Family history of malignant neoplasm of trachea, bronchus and lung: Secondary | ICD-10-CM | POA: Diagnosis not present

## 2014-04-16 DIAGNOSIS — B349 Viral infection, unspecified: Secondary | ICD-10-CM | POA: Diagnosis present

## 2014-04-16 DIAGNOSIS — Z79899 Other long term (current) drug therapy: Secondary | ICD-10-CM | POA: Diagnosis not present

## 2014-04-16 DIAGNOSIS — J9801 Acute bronchospasm: Secondary | ICD-10-CM | POA: Diagnosis present

## 2014-04-16 DIAGNOSIS — J159 Unspecified bacterial pneumonia: Secondary | ICD-10-CM | POA: Diagnosis present

## 2014-04-16 HISTORY — DX: Acute kidney failure, unspecified: N17.9

## 2014-04-16 HISTORY — DX: Solitary pulmonary nodule: R91.1

## 2014-04-16 HISTORY — DX: Nausea with vomiting, unspecified: R11.2

## 2014-04-16 LAB — CBC WITH DIFFERENTIAL/PLATELET
Basophils Absolute: 0 10*3/uL (ref 0.0–0.1)
Basophils Relative: 0 % (ref 0–1)
EOS ABS: 0 10*3/uL (ref 0.0–0.7)
Eosinophils Relative: 0 % (ref 0–5)
HCT: 31.8 % — ABNORMAL LOW (ref 36.0–46.0)
Hemoglobin: 10.9 g/dL — ABNORMAL LOW (ref 12.0–15.0)
LYMPHS ABS: 1.4 10*3/uL (ref 0.7–4.0)
LYMPHS PCT: 40 % (ref 12–46)
MCH: 29.7 pg (ref 26.0–34.0)
MCHC: 34.3 g/dL (ref 30.0–36.0)
MCV: 86.6 fL (ref 78.0–100.0)
MONO ABS: 0.5 10*3/uL (ref 0.1–1.0)
MONOS PCT: 13 % — AB (ref 3–12)
Neutro Abs: 1.6 10*3/uL — ABNORMAL LOW (ref 1.7–7.7)
Neutrophils Relative %: 47 % (ref 43–77)
Platelets: 181 10*3/uL (ref 150–400)
RBC: 3.67 MIL/uL — AB (ref 3.87–5.11)
RDW: 12.9 % (ref 11.5–15.5)
WBC: 3.5 10*3/uL — AB (ref 4.0–10.5)

## 2014-04-16 LAB — COMPREHENSIVE METABOLIC PANEL
ALT: 10 U/L (ref 0–35)
AST: 16 U/L (ref 0–37)
Albumin: 3.2 g/dL — ABNORMAL LOW (ref 3.5–5.2)
Alkaline Phosphatase: 98 U/L (ref 39–117)
Anion gap: 14 (ref 5–15)
BILIRUBIN TOTAL: 0.1 mg/dL — AB (ref 0.3–1.2)
BUN: 24 mg/dL — ABNORMAL HIGH (ref 6–23)
CHLORIDE: 94 mmol/L — AB (ref 96–112)
CO2: 23 mmol/L (ref 19–32)
Calcium: 7.7 mg/dL — ABNORMAL LOW (ref 8.4–10.5)
Creatinine, Ser: 1.98 mg/dL — ABNORMAL HIGH (ref 0.50–1.10)
GFR calc Af Amer: 28 mL/min — ABNORMAL LOW (ref >90)
GFR calc non Af Amer: 24 mL/min — ABNORMAL LOW (ref >90)
Glucose, Bld: 102 mg/dL — ABNORMAL HIGH (ref 70–99)
Potassium: 2.9 mmol/L — ABNORMAL LOW (ref 3.5–5.1)
Sodium: 131 mmol/L — ABNORMAL LOW (ref 135–145)
Total Protein: 5.8 g/dL — ABNORMAL LOW (ref 6.0–8.3)

## 2014-04-16 LAB — MAGNESIUM: Magnesium: 1.7 mg/dL (ref 1.5–2.5)

## 2014-04-16 LAB — CLOSTRIDIUM DIFFICILE BY PCR: CDIFFPCR: NEGATIVE

## 2014-04-16 LAB — I-STAT CG4 LACTIC ACID, ED: Lactic Acid, Venous: <0.3 mmol/L — ABNORMAL LOW (ref 0.5–2.0)

## 2014-04-16 MED ORDER — ALBUTEROL SULFATE (2.5 MG/3ML) 0.083% IN NEBU: 2.5000 mg | INHALATION_SOLUTION | RESPIRATORY_TRACT | Status: DC | PRN

## 2014-04-16 MED ORDER — ACETAMINOPHEN 650 MG RE SUPP: 650.0000 mg | Freq: Four times a day (QID) | RECTAL | Status: DC | PRN

## 2014-04-16 MED ORDER — POTASSIUM CHLORIDE 10 MEQ/100ML IV SOLN
10.0000 meq | INTRAVENOUS | Status: AC
  Administered 2014-04-16 (×2): 10 meq via INTRAVENOUS
  Filled 2014-04-16: qty 100

## 2014-04-16 MED ORDER — IPRATROPIUM BROMIDE 0.02 % IN SOLN: 0.5000 mg | Freq: Four times a day (QID) | RESPIRATORY_TRACT | Status: DC

## 2014-04-16 MED ORDER — SODIUM CHLORIDE 0.9 % IV SOLN
INTRAVENOUS | Status: DC
  Administered 2014-04-16 (×2): via INTRAVENOUS

## 2014-04-16 MED ORDER — ONDANSETRON HCL 4 MG PO TABS
4.0000 mg | ORAL_TABLET | Freq: Four times a day (QID) | ORAL | Status: DC | PRN
  Administered 2014-04-18: 4 mg via ORAL
  Filled 2014-04-16: qty 1

## 2014-04-16 MED ORDER — SODIUM CHLORIDE 0.9 % IV SOLN
Freq: Once | INTRAVENOUS | Status: AC
  Administered 2014-04-16: 01:00:00 via INTRAVENOUS

## 2014-04-16 MED ORDER — IPRATROPIUM-ALBUTEROL 0.5-2.5 (3) MG/3ML IN SOLN
3.0000 mL | RESPIRATORY_TRACT | Status: DC
  Administered 2014-04-16 (×3): 3 mL via RESPIRATORY_TRACT
  Filled 2014-04-16 (×3): qty 3

## 2014-04-16 MED ORDER — ALPRAZOLAM 1 MG PO TABS
1.0000 mg | ORAL_TABLET | Freq: Three times a day (TID) | ORAL | Status: DC | PRN
  Administered 2014-04-16 – 2014-04-20 (×9): 1 mg via ORAL
  Filled 2014-04-16 (×9): qty 1

## 2014-04-16 MED ORDER — ALBUTEROL SULFATE (2.5 MG/3ML) 0.083% IN NEBU
2.5000 mg | INHALATION_SOLUTION | RESPIRATORY_TRACT | Status: DC
  Administered 2014-04-16: 2.5 mg via RESPIRATORY_TRACT
  Filled 2014-04-16: qty 3

## 2014-04-16 MED ORDER — BUDESONIDE 0.25 MG/2ML IN SUSP
0.2500 mg | Freq: Two times a day (BID) | RESPIRATORY_TRACT | Status: DC
  Administered 2014-04-16 – 2014-04-20 (×9): 0.25 mg via RESPIRATORY_TRACT
  Filled 2014-04-16 (×9): qty 2

## 2014-04-16 MED ORDER — ALBUTEROL SULFATE HFA 108 (90 BASE) MCG/ACT IN AERS: 2.0000 | INHALATION_SPRAY | Freq: Four times a day (QID) | RESPIRATORY_TRACT | Status: DC | PRN

## 2014-04-16 MED ORDER — FLUOXETINE HCL 20 MG PO CAPS
40.0000 mg | ORAL_CAPSULE | Freq: Every morning | ORAL | Status: DC
Start: 2014-04-16 — End: 2014-04-20
  Administered 2014-04-16 – 2014-04-20 (×5): 40 mg via ORAL
  Filled 2014-04-16 (×5): qty 2

## 2014-04-16 MED ORDER — OXYCODONE HCL 5 MG PO TABS
10.0000 mg | ORAL_TABLET | Freq: Once | ORAL | Status: AC
  Administered 2014-04-16: 10 mg via ORAL
  Filled 2014-04-16: qty 2

## 2014-04-16 MED ORDER — ATENOLOL 50 MG PO TABS
50.0000 mg | ORAL_TABLET | Freq: Every day | ORAL | Status: DC
Start: 2014-04-16 — End: 2014-04-20
  Administered 2014-04-16 – 2014-04-20 (×5): 50 mg via ORAL
  Filled 2014-04-16 (×5): qty 1

## 2014-04-16 MED ORDER — ALPRAZOLAM 0.5 MG PO TABS
0.5000 mg | ORAL_TABLET | Freq: Once | ORAL | Status: AC
  Administered 2014-04-16: 0.5 mg via ORAL
  Filled 2014-04-16: qty 1

## 2014-04-16 MED ORDER — HYDRALAZINE HCL 50 MG PO TABS
100.0000 mg | ORAL_TABLET | Freq: Three times a day (TID) | ORAL | Status: DC
  Administered 2014-04-16: 100 mg via ORAL
  Filled 2014-04-16: qty 2

## 2014-04-16 MED ORDER — AMLODIPINE BESYLATE 10 MG PO TABS
10.0000 mg | ORAL_TABLET | Freq: Every day | ORAL | Status: DC
  Administered 2014-04-16 – 2014-04-20 (×5): 10 mg via ORAL
  Filled 2014-04-16 (×5): qty 1

## 2014-04-16 MED ORDER — DIPHENOXYLATE-ATROPINE 2.5-0.025 MG PO TABS
1.0000 | ORAL_TABLET | Freq: Four times a day (QID) | ORAL | Status: DC | PRN
  Administered 2014-04-16 – 2014-04-18 (×3): 1 via ORAL
  Filled 2014-04-16 (×3): qty 1

## 2014-04-16 MED ORDER — ENOXAPARIN SODIUM 30 MG/0.3ML ~~LOC~~ SOLN
30.0000 mg | SUBCUTANEOUS | Status: DC
  Administered 2014-04-16 – 2014-04-17 (×2): 30 mg via SUBCUTANEOUS
  Filled 2014-04-16 (×2): qty 0.3

## 2014-04-16 MED ORDER — POTASSIUM CHLORIDE IN NACL 40-0.9 MEQ/L-% IV SOLN
INTRAVENOUS | Status: DC
  Administered 2014-04-16 – 2014-04-18 (×4): 100 mL/h via INTRAVENOUS
  Administered 2014-04-20: 50 mL/h via INTRAVENOUS
  Filled 2014-04-16 (×12): qty 1000

## 2014-04-16 MED ORDER — OXYCODONE HCL 5 MG PO TABS
10.0000 mg | ORAL_TABLET | Freq: Three times a day (TID) | ORAL | Status: DC | PRN
  Administered 2014-04-16 – 2014-04-20 (×10): 10 mg via ORAL
  Filled 2014-04-16 (×10): qty 2

## 2014-04-16 MED ORDER — NICOTINE 14 MG/24HR TD PT24
14.0000 mg | MEDICATED_PATCH | Freq: Every day | TRANSDERMAL | Status: DC
  Administered 2014-04-16 – 2014-04-20 (×5): 14 mg via TRANSDERMAL
  Filled 2014-04-16 (×5): qty 1

## 2014-04-16 MED ORDER — ONDANSETRON HCL 4 MG/2ML IJ SOLN: 4.0000 mg | Freq: Four times a day (QID) | INTRAMUSCULAR | Status: DC | PRN

## 2014-04-16 MED ORDER — ACETAMINOPHEN 325 MG PO TABS
650.0000 mg | ORAL_TABLET | Freq: Four times a day (QID) | ORAL | Status: DC | PRN
  Administered 2014-04-17 – 2014-04-19 (×2): 650 mg via ORAL
  Filled 2014-04-16 (×2): qty 2

## 2014-04-16 NOTE — Progress Notes (Signed)
MD paged with pt's soft BP.  Last manual BP 92/60.  Order to discontinue apresoline given.

## 2014-04-16 NOTE — H&P (Signed)
[facility] History and Physical  RAHIMA FLEISHMAN WVP:[id number] DOB: 05/03/1945 DOA: 04/15/2014  Referring physician: ER physician. PCP: Secundino Ginger, PA-C   Chief Complaint: Nausea vomiting and diarrhea.  HPI: Allison Vaughan is a 69 y.o. female with history of COPD and hypertension presents to the ER because of persistent nausea vomiting diarrhea. Patient has been having these symptoms over the last 1 week. Patient had called her PCP and was prescribed antibiotics despite the patient is to have diarrhea. Patient also has been having crampy abdominal pain. Patient states one of her grandson's friend had similar symptoms and probably she got it from him. CT abdomen and pelvis done in the ER was unremarkable. On exam patient's abdomen appears benign. Labs reveal acute renal failure with hypokalemia. And patient also is wheezing bilaterally.   Review of Systems: As presented in the history of presenting illness, rest negative.  Past Medical History  Diagnosis Date  . Hypertension   . Chronic pain   . Anxiety   . COPD (chronic obstructive pulmonary disease)    Past Surgical History  Procedure Laterality Date  . Eye surgery     Social History:  reports that she has been smoking Cigarettes.  She has been smoking about 1.00 pack per day. She does not have any smokeless tobacco history on file. She reports that she drinks about 1.2 oz of alcohol per week. She reports that she does not use illicit drugs. Where does patient live home. Can patient participate in ADLs? Yes.  No Known Allergies  Family History: History reviewed. No pertinent family history.    Prior to Admission medications   Medication Sig Start Date End Date Taking? Authorizing Provider  albuterol (PROVENTIL HFA;VENTOLIN HFA) 108 (90 BASE) MCG/ACT inhaler Inhale 2 puffs into the lungs every 6 (six) hours as needed for wheezing. 06/09/12  Yes Monika Salk, MD  ALPRAZolam Duanne Moron) 1 MG tablet Take 1 mg by mouth 3  (three) times daily as needed for anxiety or sleep. For anxiety   Yes Historical Provider, MD  atenolol (TENORMIN) 50 MG tablet Take 50 mg by mouth daily.   Yes Historical Provider, MD  FLUoxetine (PROZAC) 40 MG capsule Take 40 mg by mouth every morning.    Yes Historical Provider, MD  hydrALAZINE (APRESOLINE) 100 MG tablet Take 1 tablet by mouth 3 (three) times daily. 03/23/14  Yes Historical Provider, MD  hydrochlorothiazide (HYDRODIURIL) 25 MG tablet Take 25 mg by mouth daily.   Yes Historical Provider, MD  losartan (COZAAR) 100 MG tablet Take 1 tablet by mouth daily. 04/08/14  Yes Historical Provider, MD  Oxycodone HCl 10 MG TABS Take 10 mg by mouth 3 (three) times daily as needed. For pain   Yes Historical Provider, MD  sulfamethoxazole-trimethoprim (BACTRIM DS,SEPTRA DS) 800-160 MG per tablet Take 1 tablet by mouth 2 (two) times daily.   Yes Historical Provider, MD  tiotropium (SPIRIVA HANDIHALER) 18 MCG inhalation capsule Place 1 capsule (18 mcg total) into inhaler and inhale daily. 06/09/12  Yes Monika Salk, MD  amLODipine (NORVASC) 10 MG tablet Take 1 tablet (10 mg total) by mouth daily. 06/09/12   Monika Salk, MD  doxycycline (VIBRA-TABS) 100 MG tablet Take 1 tablet (100 mg total) by mouth every 12 (twelve) hours. Patient not taking: Reported on 04/15/2014 06/27/12   Orson Eva, MD  guaiFENesin (MUCINEX) 600 MG 12 hr tablet Take 1,200 mg by mouth 2 (two) times daily as needed for congestion.    Historical Provider,  MD  omeprazole (PRILOSEC) 40 MG capsule Take 40 mg by mouth daily.     Historical Provider, MD  oxyCODONE (ROXICODONE) 5 MG immediate release tablet Take 1 tablet (5 mg total) by mouth every 6 (six) hours as needed for pain. Patient not taking: Reported on 04/15/2014 07/04/12   Fredia Sorrow, MD    Physical Exam: Filed Vitals:   04/16/14 0005 04/16/14 0030 04/16/14 0100 04/16/14 0110  BP: 130/60 117/73  132/68  Pulse:  71  80  Temp:    98.2 F (36.8 C)  TempSrc:    Oral  Resp:     20  Height:   5' (1.524 m)   Weight:   58.287 kg (128 lb 8 oz)   SpO2:  98%  91%     General:  Moderately built and poorly nourished.  Eyes: Anicteric no pallor.  ENT: No discharge from the ears eyes nose or mouth.  Neck: No mass felt.  Cardiovascular: S1-S2 heard.  Respiratory: No rhonchi or crepitations.  Abdomen: Soft nontender bowel sounds present.  Skin: No rash.  Musculoskeletal: No edema.  Psychiatric: Appears normal.  Neurologic: Alert awake oriented to time place and person. Moves all extremities.  Labs on Admission:  Basic Metabolic Panel:  Recent Labs Lab 04/15/14 2008  NA 131*  K 2.8*  CL 92*  CO2 26  GLUCOSE 130*  BUN 29*  CREATININE 2.65*  CALCIUM 8.9   Liver Function Tests:  Recent Labs Lab 04/15/14 2008  AST 19  ALT 11  ALKPHOS 115  BILITOT 0.4  PROT 6.9  ALBUMIN 3.7    Recent Labs Lab 04/15/14 2008  LIPASE 14   No results for input(s): AMMONIA in the last 168 hours. CBC:  Recent Labs Lab 04/15/14 2008  WBC 5.7  NEUTROABS 3.5  HGB 12.7  HCT 36.2  MCV 86.0  PLT 239   Cardiac Enzymes:  Recent Labs Lab 04/15/14 2008  TROPONINI <0.03    BNP (last 3 results) No results for input(s): BNP in the last 8760 hours.  ProBNP (last 3 results) No results for input(s): PROBNP in the last 8760 hours.  CBG: No results for input(s): GLUCAP in the last 168 hours.  Radiological Exams on Admission: Ct Abdomen Pelvis Wo Contrast  04/16/2014   CLINICAL DATA:  Acute onset of nausea, vomiting and diarrhea. Hypotension. Initial encounter.  EXAM: CT ABDOMEN AND PELVIS WITHOUT CONTRAST  TECHNIQUE: Multidetector CT imaging of the abdomen and pelvis was performed following the standard protocol without IV contrast.  COMPARISON:  MRI of the abdomen performed 04/08/2004  FINDINGS: There appears to be a 2.9 cm spiculated nodule at the right lung base. This raises concern for malignancy. Infection is thought to be less likely. Scattered  coronary artery calcification is seen.  The liver and spleen are unremarkable in appearance. The gallbladder is within normal limits. The pancreas and adrenal glands are unremarkable.  The kidneys are unremarkable in appearance. There is no evidence of hydronephrosis. No renal or ureteral stones are seen. No perinephric stranding is appreciated.  No free fluid is identified. The small bowel is unremarkable in appearance. The stomach is within normal limits. No acute vascular abnormalities are seen. Diffuse calcification is noted along the abdominal aorta and its branches.  The appendix is not definitely seen; there is no evidence of appendicitis. The colon is unremarkable in appearance.  The bladder is relatively decompressed and grossly unremarkable, though not well assessed. The patient is status post hysterectomy. The ovaries are  grossly symmetric. No suspicious adnexal masses are seen. No inguinal lymphadenopathy is seen.  No acute osseous abnormalities are identified. Multilevel vacuum phenomenon and disc space narrowing are noted along the lumbar spine, with minimal grade 1 retrolisthesis of L1 on L2 and of L2 on L3.  IMPRESSION: 1. No acute abnormality seen to explain the patient's symptoms. 2. 2.9 cm spiculated nodule noted at the right lung base. This raises concern for malignancy. Infection is thought to be less likely, given apparent spiculation. PET/CT or biopsy would be helpful for further evaluation, as deemed clinically appropriate. 3. Scattered coronary artery calcifications seen. 4. Diffuse calcification along the abdominal aorta and its branches. 5. Mild diffuse degenerative change along the lumbar spine.   Electronically Signed   By: Garald Balding M.D.   On: 04/16/2014 00:40   Dg Chest 2 View  04/15/2014   CLINICAL DATA:  Cough.  Congestion.  Shortness of breath.  EXAM: CHEST  2 VIEW  COMPARISON:  07/04/2012  FINDINGS: Mild hyperinflation. Lateral view degraded by patient arm position. Midline  trachea. Mild cardiomegaly with a tortuous thoracic aorta. No pleural effusion or pneumothorax. Mild interstitial thickening. No lobar consolidation. S-shaped thoracolumbar spine curvature.  IMPRESSION: Cardiomegaly and mild COPD/chronic bronchitis. No acute superimposed process.   Electronically Signed   By: Abigail Miyamoto M.D.   On: 04/15/2014 22:00     Assessment/Plan Principal Problem:   Nausea vomiting and diarrhea Active Problems:   COPD (chronic obstructive pulmonary disease)   Acute renal failure   Hypokalemia   Lung nodule   1. Nausea vomiting and diarrhea - suspect most likely viral in etiology. Check stool for C. difficile and stool cultures. Continue with gentle hydration. 2. Acute renal failure hypokalemia - probably from vomiting and diarrhea. Renal function will have further worsened as patient is also on ARB and Lasix which will be held at this time. Patient blood pressure initially was mildly hypotensive in the ER. We will continue with hydration and hold diuretics and ARB. Close to follow intake output and metabolic panel.  3. Hypokalemia - replace and recheck. Check magnesium levels. Hypokalemia from nausea vomiting and diarrhea. 4. COPD exacerbation - I have placed patient on nebulizer and Pulmicort. If patient continues to wheeze and IV steroids. 5. Lung nodule - concerning for malignancy. I have explained to patient that patient will need at discharge immediate follow with pulmonologist for further workup on this. Patient agrees. 6. Hypertension - see #2.   DVT Prophylaxis Lovenox.  Code Status: Full code.  Family Communication: None.  Disposition Plan: Admit to inpatient.    Kendalyn Cranfield N. [facility] Pager [phone].  If 7PM-7AM, please contact night-coverage [email or web address] Password [email or web address] 04/16/2014, 2:55 AM

## 2014-04-16 NOTE — Progress Notes (Signed)
Progress Note   Allison Vaughan EUM:[id number] DOB: [birth year] DOA: 04/15/2014 PCP: Secundino Ginger, PA-C   Brief Narrative:   Allison Vaughan is an 69 y.o. female the PMH of COPD and hypertension who was admitted 04/15/14 with a chief complaint of a one-week history of nausea, vomiting and diarrhea. CT scan of the abdomen and pelvis, done on admission, was unremarkable. Patient was noted to be wheezing on presentation, with lab values significant for creatinine of 2.65 and a potassium of 2.8.  Assessment/Plan:   Principal Problem:   Nausea vomiting and diarrhea - Felt to be viral in etiology as patient had family members with similar complaints. - C. difficile PCR negative. Follow-up stool cultures. - Treat with supportive care with bowel rest/clear liquid diet, IV fluids, and anti-emetics.  - Advance diet as tolerated.  Active Problems:   COPD (chronic obstructive pulmonary disease) - Continue Pulmicort and bronchodilators.    Acute renal failure - Likely prerenal in etiology. Hold ARB and Lasix. Continue to hydrate.    Hypokalemia - Likely from GI losses. Replete. Magnesium okay.    Lung nodule - Patient counseled regarding the need for appropriate follow-up.    History of hypertension  - Antihypertensives on hold, patient hypotensive on admission.    DVT Prophylaxis - Lovenox ordered.  Code Status: Full. Family Communication: No family at the bedside. Disposition Plan: Home when stable.   IV Access:    Peripheral IV   Procedures and diagnostic studies:   Ct Abdomen Pelvis Wo Contrast 04/16/2014: 1. No acute abnormality seen to explain the patient's symptoms. 2. 2.9 cm spiculated nodule noted at the right lung base. This raises concern for malignancy. Infection is thought to be less likely, given apparent spiculation. PET/CT or biopsy would be helpful for further evaluation, as deemed clinically appropriate. 3. Scattered coronary artery calcifications seen.  4. Diffuse calcification along the abdominal aorta and its branches. 5. Mild diffuse degenerative change along the lumbar spine.     Dg Chest 2 View 04/15/2014: Cardiomegaly and mild COPD/chronic bronchitis. No acute superimposed process.     Medical Consultants:    None.  Anti-Infectives:    None.  Subjective:   Allison Vaughan says the last time she vomited was yesterday a.m.  She has had loose stools through the night.  Feels very tired/fatigued.  No dyspnea, moist cough.  Reports some abdominal discomfort.  Objective:    Filed Vitals:   04/16/14 0030 04/16/14 0100 04/16/14 0110 04/16/14 0601  BP: 117/73  132/68 152/55  Pulse: 71  80 66  Temp:   98.2 F (36.8 C) 98.2 F (36.8 C)  TempSrc:   Oral Oral  Resp:   20 17  Height:  5' (1.524 m)    Weight:  58.287 kg (128 lb 8 oz)    SpO2: 98%  91% 99%    Intake/Output Summary (Last 24 hours) at 04/16/14 0825 Last data filed at 04/16/14 0201  Gross per 24 hour  Intake    240 ml  Output      0 ml  Net    240 ml    Exam: Gen:  Weak Cardiovascular:  RRR, No M/R/G Respiratory:  Lungs diminished with a few course wheezes Gastrointestinal:  Abdomen soft, NT/ND, + BS Extremities:  No C/E/C   Data Reviewed:    Labs: Basic Metabolic Panel:  Recent Labs Lab 04/15/14 2008 04/16/14 0345  NA 131* 131*  K 2.8* 2.9*  CL 92* 94*  CO2 26 23  GLUCOSE 130* 102*  BUN 29* 24*  CREATININE 2.65* 1.98*  CALCIUM 8.9 7.7*  MG  --  1.7   GFR Estimated Creatinine Clearance: 20.8 mL/min (by C-G formula based on Cr of 1.98). Liver Function Tests:  Recent Labs Lab 04/15/14 2008 04/16/14 0345  AST 19 16  ALT 11 10  ALKPHOS 115 98  BILITOT 0.4 0.1*  PROT 6.9 5.8*  ALBUMIN 3.7 3.2*    Recent Labs Lab 04/15/14 2008  LIPASE 14   CBC:  Recent Labs Lab 04/15/14 2008 04/16/14 0345  WBC 5.7 3.5*  NEUTROABS 3.5 1.6*  HGB 12.7 10.9*  HCT 36.2 31.8*  MCV 86.0 86.6  PLT 239 181   Cardiac Enzymes:  Recent  Labs Lab 04/15/14 2008  TROPONINI <0.03   Sepsis Labs:  Recent Labs Lab 04/15/14 2008 04/16/14 0039 04/16/14 0345  WBC 5.7  --  3.5*  LATICACIDVEN  --  <0.30*  --    Microbiology Recent Results (from the past 240 hour(s))  Clostridium Difficile by PCR     Status: None   Collection Time: 04/16/14  1:04 AM  Result Value Ref Range Status   C difficile by pcr NEGATIVE NEGATIVE Final     Medications:   . albuterol  2.5 mg Nebulization Q4H  . amLODipine  10 mg Oral Daily  . atenolol  50 mg Oral Daily  . budesonide (PULMICORT) nebulizer solution  0.25 mg Nebulization BID  . enoxaparin (LOVENOX) injection  30 mg Subcutaneous Q24H  . FLUoxetine  40 mg Oral q morning - 10a  . hydrALAZINE  100 mg Oral TID  . ipratropium  0.5 mg Nebulization Q6H   Continuous Infusions: . sodium chloride 100 mL/hr at 04/16/14 0805    Time spent: 35 minutes with > 50% of time discussing current diagnostic test results, clinical impression and plan of care.    LOS: 0 days   Swathi Dauphin  [facility] Pager [phone]. If unable to reach me by pager, please call my cell phone at [phone].  *Please refer to [email or web address], password [email or web address] to get updated schedule on who will round on this patient, as hospitalists switch teams weekly. If 7PM-7AM, please contact night-coverage at [email or web address], password [email or web address] for any overnight needs.  04/16/2014, 8:25 AM

## 2014-04-16 NOTE — Progress Notes (Signed)
MD informed of C-diff result is negative. SRP, RN

## 2014-04-17 ENCOUNTER — Other Ambulatory Visit (HOSPITAL_COMMUNITY): Payer: Medicare Other

## 2014-04-17 ENCOUNTER — Inpatient Hospital Stay (HOSPITAL_COMMUNITY): Payer: Medicare Other

## 2014-04-17 LAB — CBC
HEMATOCRIT: 29.9 % — AB (ref 36.0–46.0)
Hemoglobin: 10 g/dL — ABNORMAL LOW (ref 12.0–15.0)
MCH: 29.6 pg (ref 26.0–34.0)
MCHC: 33.4 g/dL (ref 30.0–36.0)
MCV: 88.5 fL (ref 78.0–100.0)
PLATELETS: 187 10*3/uL (ref 150–400)
RBC: 3.38 MIL/uL — ABNORMAL LOW (ref 3.87–5.11)
RDW: 13.4 % (ref 11.5–15.5)
WBC: 5.4 10*3/uL (ref 4.0–10.5)

## 2014-04-17 LAB — BASIC METABOLIC PANEL
Anion gap: 8 (ref 5–15)
BUN: 11 mg/dL (ref 6–23)
CO2: 23 mmol/L (ref 19–32)
Calcium: 8.3 mg/dL — ABNORMAL LOW (ref 8.4–10.5)
Chloride: 102 mmol/L (ref 96–112)
Creatinine, Ser: 0.95 mg/dL (ref 0.50–1.10)
GFR calc Af Amer: 68 mL/min — ABNORMAL LOW (ref >90)
GFR calc non Af Amer: 59 mL/min — ABNORMAL LOW (ref >90)
GLUCOSE: 92 mg/dL (ref 70–99)
Potassium: 3.7 mmol/L (ref 3.5–5.1)
SODIUM: 133 mmol/L — AB (ref 135–145)

## 2014-04-17 MED ORDER — IPRATROPIUM-ALBUTEROL 0.5-2.5 (3) MG/3ML IN SOLN
3.0000 mL | Freq: Three times a day (TID) | RESPIRATORY_TRACT | Status: DC
  Administered 2014-04-17 – 2014-04-20 (×10): 3 mL via RESPIRATORY_TRACT
  Filled 2014-04-17 (×10): qty 3

## 2014-04-17 MED ORDER — ENOXAPARIN SODIUM 40 MG/0.4ML ~~LOC~~ SOLN
40.0000 mg | SUBCUTANEOUS | Status: DC
  Administered 2014-04-18 – 2014-04-20 (×3): 40 mg via SUBCUTANEOUS
  Filled 2014-04-17 (×3): qty 0.4

## 2014-04-17 MED ORDER — GUAIFENESIN ER 600 MG PO TB12
600.0000 mg | ORAL_TABLET | Freq: Two times a day (BID) | ORAL | Status: DC
  Administered 2014-04-17 – 2014-04-20 (×7): 600 mg via ORAL
  Filled 2014-04-17 (×8): qty 1

## 2014-04-17 NOTE — Progress Notes (Signed)
Patient had unwitnessed fall, patient reported falling in the room and hit the right elbow right side rib on the bed/floor right elbow with small skin tear, right side/rib with no bleeding/bruising noted, patient C/O right rib/side pain rated the pain 8 out of 10 pain scale, PRN tylenol given, right elbow cleaned and dressed. Dr. Rockne Menghini notified X-ray ordered. Will continue to assess patient.

## 2014-04-17 NOTE — Progress Notes (Signed)
   04/17/14 1512  What Happened  Was fall witnessed? No  Was patient injured? Unsure  Patient found other (Comment) (walking in hallway )  Found by No one-pt stated they fell  Stated prior activity ambulating-unassisted  Follow Up  MD notified Dr. Rockne Menghini  Time MD notified [phone]  Family notified No- patient refusal  Additional tests Yes-comment  Progress note created (see row info) Yes  Pain Assessment  Pain Assessment 0-10  Pain Score 8  Pain Type Acute pain  Pain Location Rib cage  Pain Orientation Right  Pain Descriptors / Indicators Aching  Pain Frequency Constant  Pain Onset On-going  Patients Stated Pain Goal 5  Pain Intervention(s) Medication (See eMAR);Emotional support

## 2014-04-17 NOTE — Progress Notes (Signed)
Progress Note   [name] HBZ:[id number] DOB: 1945-04-24 DOA: 04/15/2014 PCP: Secundino Ginger, PA-C   Brief Narrative:   Allison Vaughan is an 69 y.o. female the PMH of COPD and hypertension who was admitted 04/15/14 with a chief complaint of a one-week history of nausea, vomiting and diarrhea. CT scan of the abdomen and pelvis, done on admission, was unremarkable. Patient was noted to be wheezing on presentation, with lab values significant for creatinine of 2.65 and a potassium of 2.8.  Assessment/Plan:   Principal Problem:   Nausea vomiting and diarrhea - Felt to be viral in etiology as patient had family members with similar complaints. - C. difficile PCR negative. Follow-up stool cultures. - Treat with supportive care with bowel rest/clear liquid diet, IV fluids, and anti-emetics.  - Advance diet as tolerated.  Active Problems:   COPD (chronic obstructive pulmonary disease) - Continue Pulmicort and bronchodilators. - Add Mucinex.    Acute renal failure - Likely prerenal in etiology. Hold ARB and Lasix. Continue to hydrate. - Renal function much better today.    Hypokalemia - Likely from GI losses. Repleted. Magnesium okay.    Lung nodule - Spiculated and patient has a history of heavy tobacco abuse, as well as +FH of lung cancer. - CT guided biopsy requested.    History of hypertension  - Antihypertensives on hold, patient hypotensive on admission.    DVT Prophylaxis - Lovenox ordered.  Code Status: Full. Family Communication: No family at the bedside. Disposition Plan: Home when stable.   IV Access:    Peripheral IV   Procedures and diagnostic studies:   Ct Abdomen Pelvis Wo Contrast 04/16/2014: 1. No acute abnormality seen to explain the patient's symptoms. 2. 2.9 cm spiculated nodule noted at the right lung base. This raises concern for malignancy. Infection is thought to be less likely, given apparent spiculation. PET/CT or biopsy would be  helpful for further evaluation, as deemed clinically appropriate. 3. Scattered coronary artery calcifications seen. 4. Diffuse calcification along the abdominal aorta and its branches. 5. Mild diffuse degenerative change along the lumbar spine.     Dg Chest 2 View 04/15/2014: Cardiomegaly and mild COPD/chronic bronchitis. No acute superimposed process.     Medical Consultants:    None.  Anti-Infectives:    None.  Subjective:   [name] feels a bit better.  No further vomiting and diarrhea is better.  Tolerated diet advancement, but appetite is poor.   Very anxious/tearful about CT scan findings.  Objective:    Filed Vitals:   04/16/14 1544 04/16/14 2017 04/16/14 2018 04/17/14 0416  BP: 92/60  113/31 124/71  Pulse:   70 78  Temp:   98.2 F (36.8 C) 98.1 F (36.7 C)  TempSrc:   Oral Oral  Resp:   18 20  Height:      Weight:    59.9 kg (132 lb 0.9 oz)  SpO2:  92% 97% 97%    Intake/Output Summary (Last 24 hours) at 04/17/14 0745 Last data filed at 04/17/14 0700  Gross per 24 hour  Intake   1680 ml  Output      0 ml  Net   1680 ml    Exam: Gen:  Weak Psych: Anxious/tearful Cardiovascular:  RRR, No M/R/G Respiratory:  Lungs diminished with a few course wheezes Gastrointestinal:  Abdomen soft, NT/ND, + BS Extremities:  No C/E/C   Data Reviewed:    Labs: Basic Metabolic Panel:  Recent Labs Lab  04/15/14 2008 04/16/14 0345 04/17/14 0513  NA 131* 131* 133*  K 2.8* 2.9* 3.7  CL 92* 94* 102  CO2 26 23 23   GLUCOSE 130* 102* 92  BUN 29* 24* 11  CREATININE 2.65* 1.98* 0.95  CALCIUM 8.9 7.7* 8.3*  MG  --  1.7  --    GFR Estimated Creatinine Clearance: 44 mL/min (by C-G formula based on Cr of 0.95). Liver Function Tests:  Recent Labs Lab 04/15/14 2008 04/16/14 0345  AST 19 16  ALT 11 10  ALKPHOS 115 98  BILITOT 0.4 0.1*  PROT 6.9 5.8*  ALBUMIN 3.7 3.2*    Recent Labs Lab 04/15/14 2008  LIPASE 14   CBC:  Recent Labs Lab  04/15/14 2008 04/16/14 0345 04/17/14 0513  WBC 5.7 3.5* 5.4  NEUTROABS 3.5 1.6*  --   HGB 12.7 10.9* 10.0*  HCT 36.2 31.8* 29.9*  MCV 86.0 86.6 88.5  PLT 239 181 187   Cardiac Enzymes:  Recent Labs Lab 04/15/14 2008  TROPONINI <0.03   Sepsis Labs:  Recent Labs Lab 04/15/14 2008 04/16/14 0039 04/16/14 0345 04/17/14 0513  WBC 5.7  --  3.5* 5.4  LATICACIDVEN  --  <0.30*  --   --    Microbiology Recent Results (from the past 240 hour(s))  Clostridium Difficile by PCR     Status: None   Collection Time: 04/16/14  1:04 AM  Result Value Ref Range Status   C difficile by pcr NEGATIVE NEGATIVE Final     Medications:   . amLODipine  10 mg Oral Daily  . atenolol  50 mg Oral Daily  . budesonide (PULMICORT) nebulizer solution  0.25 mg Nebulization BID  . enoxaparin (LOVENOX) injection  30 mg Subcutaneous Q24H  . FLUoxetine  40 mg Oral q morning - 10a  . ipratropium-albuterol  3 mL Nebulization TID  . nicotine  14 mg Transdermal Daily   Continuous Infusions: . 0.9 % NaCl with KCl 40 mEq / L 100 mL/hr (04/16/14 1937)    Time spent: 35 minutes with > 50% of time discussing current diagnostic test results, clinical impression and plan of care.    LOS: 1 day   RAMA,CHRISTINA  [facility] Pager [phone]. If unable to reach me by pager, please call my cell phone at [phone].  *Please refer to [email or web address], password [email or web address] to get updated schedule on who will round on this patient, as hospitalists switch teams weekly. If 7PM-7AM, please contact night-coverage at [email or web address], password [email or web address] for any overnight needs.  04/17/2014, 7:45 AM

## 2014-04-17 NOTE — Progress Notes (Signed)
IR PA aware of request for CT guided right lung nodule biopsy, Dr. Annamaria Boots has reviewed the CT abdomen/pelvis and we would recommend CT chest without contrast given her recent elevated renal function. Any questions please feel free to contact IR MD [phone] or Bradley PA-C Interventional Radiology  04/17/14  9:39 AM

## 2014-04-17 NOTE — Progress Notes (Signed)
Pt sat up in bed during our visit. She was tearful about her upcoming biopsy tomorrow. Allison Vaughan expressed how she was afraid and said she knows she should not be. She voluntarily shared with me that she has not been to church like she should. As pt reflected on her spiritual background, she also said she does not want to go to hell. Pt's one request was for prayer and her relationship with God. We talked about her relationship and she seemed to be able to realize how she has had that all along even though she has not been to church. Pt also shared her husband passed about 3 years ago from problems with his lungs and heart. Pt's family was not bedside but she shared that she had told them she did not need them today but they'd better be here in the morning. At the conclusion of our visit, she still wanted prayer. She was grateful for visit and prayer.  I will share visit with weekday Chaplain for follow-up.  Ernest Haber Chaplain   04/17/14 1600  Clinical Encounter Type  Visited With Patient

## 2014-04-18 DIAGNOSIS — J441 Chronic obstructive pulmonary disease with (acute) exacerbation: Secondary | ICD-10-CM

## 2014-04-18 DIAGNOSIS — J189 Pneumonia, unspecified organism: Secondary | ICD-10-CM

## 2014-04-18 DIAGNOSIS — N179 Acute kidney failure, unspecified: Secondary | ICD-10-CM | POA: Diagnosis present

## 2014-04-18 HISTORY — DX: Pneumonia, unspecified organism: J18.9

## 2014-04-18 MED ORDER — METHYLPREDNISOLONE SODIUM SUCC 40 MG IJ SOLR
40.0000 mg | Freq: Two times a day (BID) | INTRAMUSCULAR | Status: DC
  Administered 2014-04-18 – 2014-04-20 (×5): 40 mg via INTRAVENOUS
  Filled 2014-04-18 (×5): qty 1

## 2014-04-18 MED ORDER — LEVOFLOXACIN IN D5W 750 MG/150ML IV SOLN
750.0000 mg | INTRAVENOUS | Status: DC
  Administered 2014-04-18 – 2014-04-20 (×2): 750 mg via INTRAVENOUS
  Filled 2014-04-18 (×2): qty 150

## 2014-04-18 NOTE — Progress Notes (Signed)
Progress Note   Allison Vaughan:[id number] DOB: 1945-04-15 DOA: 04/15/2014 PCP: Secundino Ginger, PA-C   Brief Narrative:   Allison Vaughan is an 69 y.o. female the PMH of COPD and hypertension who was admitted 04/15/14 with a chief complaint of a one-week history of nausea, vomiting and diarrhea. CT scan of the abdomen and pelvis, done on admission, was unremarkable. Patient was noted to be wheezing on presentation, with lab values significant for creatinine of 2.65 and a potassium of 2.8.  Assessment/Plan:   Principal Problem:   Nausea vomiting and diarrhea - Felt to be viral in etiology as patient had family members with similar complaints. - C. difficile PCR negative. Follow-up stool cultures. - Treating with supportive care.  - Tolerated diet advancement initially, but had more N/V/D today.  Active Problems:   COPD (chronic obstructive pulmonary disease) with exacerbation - Continue Pulmicort and bronchodilators. - Continue Mucinex. - Add Solumedrol given worsening respiratory status.    Acute renal failure - Likely prerenal in etiology. Hold ARB and Lasix. Continue to hydrate. - Creatinine now back to normal values.    Hypokalemia - Likely from GI losses. Repleted. Magnesium okay.    Lung nodule / CAP - Spiculated and patient has a history of heavy tobacco abuse, as well as +FH of lung cancer. - CT guided biopsy requested.  Discussed with Dr. Barbie Banner who feels findings may be from infection. - Will start on Levaquin and set patient up for an outpatient PET scan.    History of hypertension  - Antihypertensives on hold, patient hypotensive on admission.    DVT Prophylaxis - Lovenox ordered.  Code Status: Full. Family Communication: No family at the bedside. Disposition Plan: Home when stable.   IV Access:    Peripheral IV   Procedures and diagnostic studies:   Ct Abdomen Pelvis Wo Contrast 04/16/2014: 1. No acute abnormality seen to explain the  patient's symptoms. 2. 2.9 cm spiculated nodule noted at the right lung base. This raises concern for malignancy. Infection is thought to be less likely, given apparent spiculation. PET/CT or biopsy would be helpful for further evaluation, as deemed clinically appropriate. 3. Scattered coronary artery calcifications seen. 4. Diffuse calcification along the abdominal aorta and its branches. 5. Mild diffuse degenerative change along the lumbar spine.     Dg Chest 2 View 04/15/2014: Cardiomegaly and mild COPD/chronic bronchitis. No acute superimposed process.     Medical Consultants:    Dr. Barbie Banner, IR  Anti-Infectives:    Levaquin 04/18/14--->  Subjective:   Allison Vaughan feels worse today with further nausea, vomiting and diarrhea.  Now has some dyspnea and a cough productive of yellow mucous.  Objective:    Filed Vitals:   04/17/14 1603 04/17/14 1945 04/17/14 2217 04/18/14 0517  BP:   105/65 116/70  Pulse:   65 70  Temp:   98.1 F (36.7 C) 98.5 F (36.9 C)  TempSrc:   Oral Oral  Resp:   16 16  Height:      Weight:    59.7 kg (131 lb 9.8 oz)  SpO2: 94% 93% 97% 90%    Intake/Output Summary (Last 24 hours) at 04/18/14 0727 Last data filed at 04/18/14 0600  Gross per 24 hour  Intake   2300 ml  Output    600 ml  Net   1700 ml    Exam: Gen:  Weak Cardiovascular:  RRR, No M/R/G Respiratory:  Lungs diminished with course expiratory wheezes Gastrointestinal:  Abdomen soft, NT/ND, + BS Extremities:  No C/E/C   Data Reviewed:    Labs: Basic Metabolic Panel:  Recent Labs Lab 04/15/14 2008 04/16/14 0345 04/17/14 0513  NA 131* 131* 133*  K 2.8* 2.9* 3.7  CL 92* 94* 102  CO2 26 23 23   GLUCOSE 130* 102* 92  BUN 29* 24* 11  CREATININE 2.65* 1.98* 0.95  CALCIUM 8.9 7.7* 8.3*  MG  --  1.7  --    GFR Estimated Creatinine Clearance: 43.9 mL/min (by C-G formula based on Cr of 0.95). Liver Function Tests:  Recent Labs Lab 04/15/14 2008 04/16/14 0345  AST 19 16    ALT 11 10  ALKPHOS 115 98  BILITOT 0.4 0.1*  PROT 6.9 5.8*  ALBUMIN 3.7 3.2*    Recent Labs Lab 04/15/14 2008  LIPASE 14   CBC:  Recent Labs Lab 04/15/14 2008 04/16/14 0345 04/17/14 0513  WBC 5.7 3.5* 5.4  NEUTROABS 3.5 1.6*  --   HGB 12.7 10.9* 10.0*  HCT 36.2 31.8* 29.9*  MCV 86.0 86.6 88.5  PLT 239 181 187   Cardiac Enzymes:  Recent Labs Lab 04/15/14 2008  TROPONINI <0.03   Sepsis Labs:  Recent Labs Lab 04/15/14 2008 04/16/14 0039 04/16/14 0345 04/17/14 0513  WBC 5.7  --  3.5* 5.4  LATICACIDVEN  --  <0.30*  --   --    Microbiology Recent Results (from the past 240 hour(s))  Clostridium Difficile by PCR     Status: None   Collection Time: 04/16/14  1:04 AM  Result Value Ref Range Status   C difficile by pcr NEGATIVE NEGATIVE Final  Stool culture     Status: None (Preliminary result)   Collection Time: 04/16/14  1:04 AM  Result Value Ref Range Status   Specimen Description STOOL  Final   Special Requests NONE  Final   Culture   Final    Culture reincubated for better growth Performed at [facility]    Report Status PENDING  Incomplete     Medications:   . amLODipine  10 mg Oral Daily  . atenolol  50 mg Oral Daily  . budesonide (PULMICORT) nebulizer solution  0.25 mg Nebulization BID  . enoxaparin (LOVENOX) injection  40 mg Subcutaneous Q24H  . FLUoxetine  40 mg Oral q morning - 10a  . guaiFENesin  600 mg Oral BID  . ipratropium-albuterol  3 mL Nebulization TID  . nicotine  14 mg Transdermal Daily   Continuous Infusions: . 0.9 % NaCl with KCl 40 mEq / L 100 mL/hr (04/18/14 0102)    Time spent: 35 minutes with > 50% of time discussing current diagnostic test results, clinical impression and plan of care.    LOS: 2 days   Feliciano Wynter  [facility] Pager [phone]. If unable to reach me by pager, please call my cell phone at [phone].  *Please refer to [email or web address], password [email or web address] to get updated schedule on who will  round on this patient, as hospitalists switch teams weekly. If 7PM-7AM, please contact night-coverage at [email or web address], password [email or web address] for any overnight needs.  04/18/2014, 7:27 AM

## 2014-04-18 NOTE — Progress Notes (Signed)
I have reviewed the imaging. The pt has strong risk factors for lung CA. The RLL lesion is indeterminate. I recommend a PET CT to further evaluate. If the lesion is hypermetabolic, then Bx will be indicated at that time.

## 2014-04-19 MED ORDER — LOSARTAN POTASSIUM 50 MG PO TABS
100.0000 mg | ORAL_TABLET | Freq: Every day | ORAL | Status: DC
  Administered 2014-04-19 – 2014-04-20 (×2): 100 mg via ORAL
  Filled 2014-04-19 (×2): qty 2

## 2014-04-19 NOTE — Progress Notes (Signed)
Progress Note   Allison Vaughan QQV:[id number] DOB: [birth year] DOA: 04/15/2014 PCP: Secundino Ginger, PA-C   Brief Narrative:   Allison Vaughan is an 69 y.o. female the PMH of COPD and hypertension who was admitted 04/15/14 with a chief complaint of a one-week history of nausea, vomiting and diarrhea. CT scan of the abdomen and pelvis, done on admission, was unremarkable. Patient was noted to be wheezing on presentation, with lab values significant for creatinine of 2.65 and a potassium of 2.8.  Assessment/Plan:   Principal Problem:   Nausea vomiting and diarrhea - Felt to be viral in etiology as patient had family members with similar complaints. - C. difficile PCR negative. Follow-up stool cultures. Preliminary reading shows no suspicious colonies. - Treating with supportive care.   Active Problems:   COPD (chronic obstructive pulmonary disease) with exacerbation - Continue Pulmicort and bronchodilators. - Continue Mucinex. - Continue Solumedrol given worsening respiratory status.  Still with significant bronchospasm.    Acute renal failure - Likely prerenal in etiology. Held ARB and Lasix. Resume Cozaar.  Decrease IVF. - Creatinine now back to normal values.    Hypokalemia - Likely from GI losses. Repleted. Magnesium okay.    Lung nodule / CAP - Spiculated and patient has a history of heavy tobacco abuse, as well as +FH of lung cancer. - CT guided biopsy requested.  Discussed with Dr. Barbie Banner who feels findings may be from infection. - Continue Levaquin and set patient up for an outpatient PET scan.    History of hypertension  - Antihypertensives on hold, patient hypotensive on admission.  OK to resume Cozaar today.    DVT Prophylaxis - Lovenox ordered.  Code Status: Full. Family Communication: No family at the bedside. Disposition Plan: Home when stable.   IV Access:    Peripheral IV   Procedures and diagnostic studies:   Ct Abdomen Pelvis Wo Contrast  04/16/2014: 1. No acute abnormality seen to explain the patient's symptoms. 2. 2.9 cm spiculated nodule noted at the right lung base. This raises concern for malignancy. Infection is thought to be less likely, given apparent spiculation. PET/CT or biopsy would be helpful for further evaluation, as deemed clinically appropriate. 3. Scattered coronary artery calcifications seen. 4. Diffuse calcification along the abdominal aorta and its branches. 5. Mild diffuse degenerative change along the lumbar spine.     Dg Chest 2 View 04/15/2014: Cardiomegaly and mild COPD/chronic bronchitis. No acute superimposed process.     Medical Consultants:    Dr. Barbie Banner, IR  Anti-Infectives:    Levaquin 04/18/14--->  Subjective:   Allison Vaughan is still dyspneic with cough and hoarseness.  Had some nausea this morning but no further vomiting or diarrhea.  Objective:    Filed Vitals:   04/18/14 1425 04/18/14 2011 04/18/14 2020 04/19/14 0522  BP: 153/75 123/75  150/82  Pulse: 77 73  79  Temp: 98.1 F (36.7 C) 98.3 F (36.8 C)  98.2 F (36.8 C)  TempSrc: Oral Oral  Oral  Resp: 16 24  20   Height:      Weight:    60.7 kg (133 lb 13.1 oz)  SpO2: 98% 98% 98% 93%    Intake/Output Summary (Last 24 hours) at 04/19/14 0839 Last data filed at 04/19/14 0600  Gross per 24 hour  Intake   2550 ml  Output      0 ml  Net   2550 ml    Exam: Gen:  Weak Cardiovascular:  RRR, No  M/R/G Respiratory:  Lungs diminished with course expiratory wheezes and rhonchi Gastrointestinal:  Abdomen soft, NT/ND, + BS Extremities:  No C/E/C   Data Reviewed:    Labs: Basic Metabolic Panel:  Recent Labs Lab 04/15/14 2008 04/16/14 0345 04/17/14 0513  NA 131* 131* 133*  K 2.8* 2.9* 3.7  CL 92* 94* 102  CO2 26 23 23   GLUCOSE 130* 102* 92  BUN 29* 24* 11  CREATININE 2.65* 1.98* 0.95  CALCIUM 8.9 7.7* 8.3*  MG  --  1.7  --    GFR Estimated Creatinine Clearance: 44.2 mL/min (by C-G formula based on Cr of  0.95). Liver Function Tests:  Recent Labs Lab 04/15/14 2008 04/16/14 0345  AST 19 16  ALT 11 10  ALKPHOS 115 98  BILITOT 0.4 0.1*  PROT 6.9 5.8*  ALBUMIN 3.7 3.2*    Recent Labs Lab 04/15/14 2008  LIPASE 14   CBC:  Recent Labs Lab 04/15/14 2008 04/16/14 0345 04/17/14 0513  WBC 5.7 3.5* 5.4  NEUTROABS 3.5 1.6*  --   HGB 12.7 10.9* 10.0*  HCT 36.2 31.8* 29.9*  MCV 86.0 86.6 88.5  PLT 239 181 187   Cardiac Enzymes:  Recent Labs Lab 04/15/14 2008  TROPONINI <0.03   Sepsis Labs:  Recent Labs Lab 04/15/14 2008 04/16/14 0039 04/16/14 0345 04/17/14 0513  WBC 5.7  --  3.5* 5.4  LATICACIDVEN  --  <0.30*  --   --    Microbiology Recent Results (from the past 240 hour(s))  Clostridium Difficile by PCR     Status: None   Collection Time: 04/16/14  1:04 AM  Result Value Ref Range Status   C difficile by pcr NEGATIVE NEGATIVE Final  Stool culture     Status: None (Preliminary result)   Collection Time: 04/16/14  1:04 AM  Result Value Ref Range Status   Specimen Description STOOL  Final   Special Requests NONE  Final   Culture   Final    NO SUSPICIOUS COLONIES, CONTINUING TO HOLD Performed at [facility]    Report Status PENDING  Incomplete     Medications:   . amLODipine  10 mg Oral Daily  . atenolol  50 mg Oral Daily  . budesonide (PULMICORT) nebulizer solution  0.25 mg Nebulization BID  . enoxaparin (LOVENOX) injection  40 mg Subcutaneous Q24H  . FLUoxetine  40 mg Oral q morning - 10a  . guaiFENesin  600 mg Oral BID  . ipratropium-albuterol  3 mL Nebulization TID  . levofloxacin (LEVAQUIN) IV  750 mg Intravenous Q48H  . methylPREDNISolone (SOLU-MEDROL) injection  40 mg Intravenous Q12H  . nicotine  14 mg Transdermal Daily   Continuous Infusions: . 0.9 % NaCl with KCl 40 mEq / L 100 mL/hr (04/18/14 1135)    Time spent: 25 minutes.    LOS: 3 days   [facility] Pager [phone]. If unable to reach me by  pager, please call my cell phone at [phone].  *Please refer to [email or web address], password [email or web address] to get updated schedule on who will round on this patient, as hospitalists switch teams weekly. If 7PM-7AM, please contact night-coverage at [email or web address], password [email or web address] for any overnight needs.  04/19/2014, 8:39 AM

## 2014-04-20 ENCOUNTER — Encounter (HOSPITAL_COMMUNITY): Payer: Self-pay | Admitting: Internal Medicine

## 2014-04-20 LAB — STOOL CULTURE

## 2014-04-20 LAB — CBC
HEMATOCRIT: 28.9 % — AB (ref 36.0–46.0)
HEMOGLOBIN: 9.7 g/dL — AB (ref 12.0–15.0)
MCH: 30.1 pg (ref 26.0–34.0)
MCHC: 33.6 g/dL (ref 30.0–36.0)
MCV: 89.8 fL (ref 78.0–100.0)
PLATELETS: 249 10*3/uL (ref 150–400)
RBC: 3.22 MIL/uL — AB (ref 3.87–5.11)
RDW: 13.3 % (ref 11.5–15.5)
WBC: 5.4 10*3/uL (ref 4.0–10.5)

## 2014-04-20 LAB — BASIC METABOLIC PANEL
ANION GAP: 7 (ref 5–15)
BUN: 9 mg/dL (ref 6–23)
CALCIUM: 8.5 mg/dL (ref 8.4–10.5)
CO2: 25 mmol/L (ref 19–32)
CREATININE: 0.67 mg/dL (ref 0.50–1.10)
Chloride: 102 mmol/L (ref 96–112)
GFR calc Af Amer: >90 mL/min (ref >90)
GFR calc non Af Amer: 86 mL/min — ABNORMAL LOW (ref >90)
Glucose, Bld: 162 mg/dL — ABNORMAL HIGH (ref 70–99)
Potassium: 5 mmol/L (ref 3.5–5.1)
Sodium: 134 mmol/L — ABNORMAL LOW (ref 135–145)

## 2014-04-20 MED ORDER — ALBUTEROL SULFATE (2.5 MG/3ML) 0.083% IN NEBU: 2.5000 mg | INHALATION_SOLUTION | RESPIRATORY_TRACT | Status: AC | PRN

## 2014-04-20 MED ORDER — IPRATROPIUM BROMIDE 0.02 % IN SOLN: 0.5000 mg | Freq: Four times a day (QID) | RESPIRATORY_TRACT | Status: DC

## 2014-04-20 MED ORDER — NICOTINE 14 MG/24HR TD PT24: 14.0000 mg | MEDICATED_PATCH | Freq: Every day | TRANSDERMAL | Status: DC

## 2014-04-20 MED ORDER — ONDANSETRON 4 MG PO TBDP: 4.0000 mg | ORAL_TABLET | Freq: Three times a day (TID) | ORAL | Status: DC | PRN

## 2014-04-20 MED ORDER — PREDNISONE 10 MG PO TABS: 10.0000 mg | ORAL_TABLET | Freq: Every day | ORAL | Status: DC

## 2014-04-20 MED ORDER — OMEPRAZOLE 40 MG PO CPDR: 40.0000 mg | DELAYED_RELEASE_CAPSULE | Freq: Every day | ORAL | Status: DC

## 2014-04-20 MED ORDER — LEVOFLOXACIN 500 MG PO TABS
750.0000 mg | ORAL_TABLET | Freq: Every day | ORAL | Status: DC
Start: 2014-04-20 — End: 2014-05-02

## 2014-04-20 MED ORDER — BUDESONIDE 0.25 MG/2ML IN SUSP: 0.2500 mg | Freq: Two times a day (BID) | RESPIRATORY_TRACT | Status: DC

## 2014-04-20 NOTE — Discharge Summary (Addendum)
Physician Discharge Summary  Allison Vaughan:[id number] DOB: May 07, 1945 DOA: 04/15/2014  PCP: Antonietta Jewel, MD  Admit date: 04/15/2014 Discharge date: 04/20/2014   Recommendations for Outpatient Follow-Up:   1. PCP: Please arrange for outpatient PET scan after pneumonia has cleared.   Discharge Diagnosis:   Principal Problem:    CAP (present on admission but diagnosis delayed due to CT reading of spiculated mass) with COPD exacerbation Active Problems:    Nausea, vomiting and diarrhea    COPD exacerbation    Acute renal failure    Hypokalemia    Lung nodule    CAP (community acquired pneumonia), presumed bacterial (not viral)    Acute renal failure syndrome   Discharge Condition: Improved.  Diet recommendation: Low sodium, heart healthy.     History of Present Illness:   Allison Vaughan is an 69 y.o. female the PMH of COPD and hypertension who was admitted 04/15/14 with a chief complaint of a one-week history of nausea, vomiting and diarrhea. CT scan of the abdomen and pelvis, done on admission, was unremarkable. Patient was noted to be wheezing on presentation, with lab values significant for creatinine of 2.65 and a potassium of 2.8.  Hospital Course by Problem:   Principal Problem:  CAP with COPD exacerbation - Delay in diagnosis of CAP as CT chest showed a spiculated mass, no mention of pneumonia until scans reviewed by IR when biopsy requested.  Breathing deteriorated over course of hospital stay, consistent with COPD exacerbation from pneumonia.  Levaquin subsequently started, which we will continue x 5 more days. - Continue Pulmicort and bronchodilators. - Continue Mucinex. - Prednisone taper ordered at discharge, after being treated with Solumedrol in house.  Active Problems: Nausea vomiting and diarrhea - Felt to be viral in etiology as patient had family members with similar complaints. - C. difficile PCR negative. Stool cultures negative.  -  Treated with supportive care.    Acute renal failure - Likely prerenal in etiology.   - Creatinine now back to normal values.   Hypokalemia - Likely from GI losses. Repleted. Magnesium okay.   Lung nodule / CAP - Spiculated and patient has a history of heavy tobacco abuse, as well as +FH of lung cancer. - CT guided biopsy requested. Discussed with Dr. Barbie Banner who feels findings may be from infection.   History of hypertension  - Continue Cozaar.    Medical Consultants:    Dr. Marybelle Killings, IR   Discharge Exam:   Filed Vitals:   04/20/14 1334  BP: 149/73  Pulse: 60  Temp: 98.1 F (36.7 C)  Resp: 18   Filed Vitals:   04/20/14 0530 04/20/14 0830 04/20/14 0838 04/20/14 1334  BP: 150/70   149/73  Pulse: 80   60  Temp: 98.1 F (36.7 C)   98.1 F (36.7 C)  TempSrc: Oral   Oral  Resp: 18   18  Height:      Weight: 61.9 kg (136 lb 7.4 oz)     SpO2: 95% 93% 93% 96%    Gen:  NAD Cardiovascular:  RRR, No M/R/G Respiratory: Lungs diminished, wheezing improved Gastrointestinal: Abdomen soft, NT/ND with normal active bowel sounds. Extremities: No C/E/C   The results of significant diagnostics from this hospitalization (including imaging, microbiology, ancillary and laboratory) are listed below for reference.     Procedures and Diagnostic Studies:   Ct Abdomen Pelvis Wo Contrast 04/16/2014: 1. No acute abnormality seen to explain the patient's symptoms. 2. 2.9 cm spiculated nodule  noted at the right lung base. This raises concern for malignancy. Infection is thought to be less likely, given apparent spiculation. PET/CT or biopsy would be helpful for further evaluation, as deemed clinically appropriate. 3. Scattered coronary artery calcifications seen. 4. Diffuse calcification along the abdominal aorta and its branches. 5. Mild diffuse degenerative change along the lumbar spine.   Dg Chest 2 View 04/15/2014: Cardiomegaly and mild COPD/chronic bronchitis. No acute  superimposed process.    Labs:   Basic Metabolic Panel:  Recent Labs Lab 04/15/14 2008 04/16/14 0345 04/17/14 0513 04/20/14 0507  NA 131* 131* 133* 134*  K 2.8* 2.9* 3.7 5.0  CL 92* 94* 102 102  CO2 26 23 23 25   GLUCOSE 130* 102* 92 162*  BUN 29* 24* 11 9  CREATININE 2.65* 1.98* 0.95 0.67  CALCIUM 8.9 7.7* 8.3* 8.5  MG  --  1.7  --   --    GFR Estimated Creatinine Clearance: 53 mL/min (by C-G formula based on Cr of 0.67). Liver Function Tests:  Recent Labs Lab 04/15/14 2008 04/16/14 0345  AST 19 16  ALT 11 10  ALKPHOS 115 98  BILITOT 0.4 0.1*  PROT 6.9 5.8*  ALBUMIN 3.7 3.2*    Recent Labs Lab 04/15/14 2008  LIPASE 14   CBC:  Recent Labs Lab 04/15/14 2008 04/16/14 0345 04/17/14 0513 04/20/14 0507  WBC 5.7 3.5* 5.4 5.4  NEUTROABS 3.5 1.6*  --   --   HGB 12.7 10.9* 10.0* 9.7*  HCT 36.2 31.8* 29.9* 28.9*  MCV 86.0 86.6 88.5 89.8  PLT 239 181 187 249   Cardiac Enzymes:  Recent Labs Lab 04/15/14 2008  TROPONINI <0.03   Microbiology Recent Results (from the past 240 hour(s))  Clostridium Difficile by PCR     Status: None   Collection Time: 04/16/14  1:04 AM  Result Value Ref Range Status   C difficile by pcr NEGATIVE NEGATIVE Final  Stool culture     Status: None   Collection Time: 04/16/14  1:04 AM  Result Value Ref Range Status   Specimen Description STOOL  Final   Special Requests NONE  Final   Culture   Final    NO SALMONELLA, SHIGELLA, CAMPYLOBACTER, YERSINIA, OR E.COLI 0157:H7 ISOLATED Performed at [facility]    Report Status 04/20/2014 FINAL  Final     Discharge Instructions:       Discharge Instructions    Call MD for:  difficulty breathing, headache or visual disturbances    Complete by:  As directed      Call MD for:  extreme fatigue    Complete by:  As directed      Call MD for:  persistant nausea and vomiting    Complete by:  As directed      Call MD for:  temperature >100.4    Complete by:  As directed        Diet - low sodium heart healthy    Complete by:  As directed      Discharge instructions    Complete by:  As directed   It is imperative that you follow up with your PCP to set up an outpatient PET scan to make sure that mass in your lungs is not cancerous.  You were treated for pneumonia while you were in the hospital.  It is important that you see your PCP in follow up, and have him/her order a follow up chest x-ray in 4-6 weeks to ensure resolution of  the pneumonia and to exclude any underlying pathology.  Take all of your antibiotics, as prescribed, even if you feel better.  Do not discontinue antibiotics prematurely.     Increase activity slowly    Complete by:  As directed             Medication List    STOP taking these medications        doxycycline 100 MG tablet  Commonly known as:  VIBRA-TABS     sulfamethoxazole-trimethoprim 800-160 MG per tablet  Commonly known as:  BACTRIM DS,SEPTRA DS     tiotropium 18 MCG inhalation capsule  Commonly known as:  SPIRIVA HANDIHALER      TAKE these medications        albuterol 108 (90 BASE) MCG/ACT inhaler  Commonly known as:  PROVENTIL HFA;VENTOLIN HFA  Inhale 2 puffs into the lungs every 6 (six) hours as needed for wheezing.     albuterol (2.5 MG/3ML) 0.083% nebulizer solution  Commonly known as:  PROVENTIL  Take 3 mLs (2.5 mg total) by nebulization every 2 (two) hours as needed for wheezing.     ALPRAZolam 1 MG tablet  Commonly known as:  XANAX  Take 1 mg by mouth 3 (three) times daily as needed for anxiety or sleep. For anxiety     amLODipine 10 MG tablet  Commonly known as:  NORVASC  Take 1 tablet (10 mg total) by mouth daily.     atenolol 50 MG tablet  Commonly known as:  TENORMIN  Take 50 mg by mouth daily.     budesonide 0.25 MG/2ML nebulizer solution  Commonly known as:  PULMICORT  Take 2 mLs (0.25 mg total) by nebulization 2 (two) times daily.     FLUoxetine 40 MG capsule  Commonly known as:  PROZAC   Take 40 mg by mouth every morning.     guaiFENesin 600 MG 12 hr tablet  Commonly known as:  MUCINEX  Take 1,200 mg by mouth 2 (two) times daily as needed for congestion.     hydrALAZINE 100 MG tablet  Commonly known as:  APRESOLINE  Take 1 tablet by mouth 3 (three) times daily.     hydrochlorothiazide 25 MG tablet  Commonly known as:  HYDRODIURIL  Take 25 mg by mouth daily.     ipratropium 0.02 % nebulizer solution  Commonly known as:  ATROVENT  Take 2.5 mLs (0.5 mg total) by nebulization 4 (four) times daily.     levofloxacin 500 MG tablet  Commonly known as:  LEVAQUIN  Take 1.5 tablets (750 mg total) by mouth daily.     losartan 100 MG tablet  Commonly known as:  COZAAR  Take 1 tablet by mouth daily.     nicotine 14 mg/24hr patch  Commonly known as:  NICODERM CQ - dosed in mg/24 hours  Place 1 patch (14 mg total) onto the skin daily.     omeprazole 40 MG capsule  Commonly known as:  PRILOSEC  Take 1 capsule (40 mg total) by mouth daily.     ondansetron 4 MG disintegrating tablet  Commonly known as:  ZOFRAN ODT  Take 1 tablet (4 mg total) by mouth every 8 (eight) hours as needed for nausea or vomiting.     Oxycodone HCl 10 MG Tabs  Take 10 mg by mouth 3 (three) times daily as needed. For pain     predniSONE 10 MG tablet  Commonly known as:  DELTASONE  Take 1 tablet (10 mg total)  by mouth daily with breakfast.       Follow-up Information    Follow up with [facility], MD On 04/28/2014.   Specialty:  Internal Medicine   Why:  2 pm to set up your PET scan   Contact information:   [address] [place] [address] [phone]        Time coordinating discharge: 35 minutes.  Signed:  RAMA,CHRISTINA  Pager [phone] [facility] 04/20/2014, 2:36 PM

## 2014-04-20 NOTE — Evaluation (Signed)
Physical Therapy Evaluation Patient Details Name: Allison Vaughan MRN: [id number] DOB: 1945/04/24 Today's Date: 04/20/2014   History of Present Illness  69 y/o female admitted with nausea, vomiting, and diarrhea with PMH of COPD, HTN. Pt also found to have R lung mass.  Clinical Impression  Pt admitted with above diagnosis. Pt currently with functional limitations due to the deficits listed below (see PT Problem List).  Pt will benefit from skilled PT to increase their independence and safety with mobility to allow discharge to the venue listed below.  Pt much steadier with RW and verbalized understanding of using it when she goes home until she regains strength/balance.  Pt is agreeable to HHPT if her insurance will cover it.     Follow Up Recommendations Home health PT (Pt agreeable if insurance covers it)    Equipment Recommendations  None recommended by PT    Recommendations for Other Services OT consult     Precautions / Restrictions Precautions Precautions: Fall Precaution Comments: Pt with fall during hospitalization Restrictions Weight Bearing Restrictions: No      Mobility  Bed Mobility Overal bed mobility: Modified Independent                Transfers Overall transfer level: Modified independent                  Ambulation/Gait Ambulation/Gait assistance: Min assist;Min guard Ambulation Distance (Feet): 120 Feet (plus 250' with RW) Assistive device: None Gait Pattern/deviations: Staggering left;Staggering right     General Gait Details: Pt with unsteadiness with ambulation without AD and required MIN A.  Then ambulated with RW and was much steadier.  Pt verbalized understanding of needing to use RW at home.  Stairs            Wheelchair Mobility    Modified Rankin (Stroke Patients Only)       Balance Overall balance assessment: Needs assistance   Sitting balance-Leahy Scale: Fair     Standing balance support: No upper  extremity supported;During functional activity Standing balance-Leahy Scale: Poor Standing balance comment: Pt unsteady without AD.                             Pertinent Vitals/Pain Pain Assessment: 0-10 Pain Score: 3  Pain Location: L mid-back Pain Descriptors / Indicators: Stabbing;Constant Pain Intervention(s): Premedicated before session    Home Living Family/patient expects to be discharged to:: Private residence Living Arrangements: Children Available Help at Discharge: Available 24 hours/day;Family Type of Home: House Home Access: Stairs to enter Entrance Stairs-Rails: None Technical brewer of Steps: 4 Home Layout: One level Home Equipment: Environmental consultant - 2 wheels      Prior Function Level of Independence: Independent               Hand Dominance        Extremity/Trunk Assessment   Upper Extremity Assessment: Overall WFL for tasks assessed           Lower Extremity Assessment: Overall WFL for tasks assessed      Cervical / Trunk Assessment: Normal  Communication   Communication: No difficulties  Cognition Arousal/Alertness: Awake/alert Behavior During Therapy: WFL for tasks assessed/performed Overall Cognitive Status: Within Functional Limits for tasks assessed                      General Comments      Exercises        Assessment/Plan  PT Assessment    PT Diagnosis Difficulty walking   PT Problem List    PT Treatment Interventions     PT Goals (Current goals can be found in the Care Plan section) Acute Rehab PT Goals Patient Stated Goal: To go home PT Goal Formulation: With patient Time For Goal Achievement: 05/04/14 Potential to Achieve Goals: Good    Frequency     Barriers to discharge        Co-evaluation               End of Session Equipment Utilized During Treatment: Gait belt Activity Tolerance: Patient tolerated treatment well Patient left: in chair;with chair alarm set;with call  bell/phone within reach Nurse Communication: Mobility status         Time: 3893-7342 PT Time Calculation (min) (ACUTE ONLY): 24 min   Charges:   PT Evaluation $Initial PT Evaluation Tier I: 1 Procedure PT Treatments $Gait Training: 8-22 mins   PT G Codes:        Ovila Lepage LUBECK 04/20/2014, 1:01 PM

## 2014-04-20 NOTE — Discharge Instructions (Signed)
Pneumonia Pneumonia is an infection of the lungs.  CAUSES Pneumonia may be caused by bacteria or a virus. Usually, these infections are caused by breathing infectious particles into the lungs (respiratory tract). SIGNS AND SYMPTOMS   Cough.  Fever.  Chest pain.  Increased rate of breathing.  Wheezing.  Mucus production. DIAGNOSIS  If you have the common symptoms of pneumonia, your health care provider will typically confirm the diagnosis with a chest X-ray. The X-ray will show an abnormality in the lung (pulmonary infiltrate) if you have pneumonia. Other tests of your blood, urine, or sputum may be done to find the specific cause of your pneumonia. Your health care provider may also do tests (blood gases or pulse oximetry) to see how well your lungs are working. TREATMENT  Some forms of pneumonia may be spread to other people when you cough or sneeze. You may be asked to wear a mask before and during your exam. Pneumonia that is caused by bacteria is treated with antibiotic medicine. Pneumonia that is caused by the influenza virus may be treated with an antiviral medicine. Most other viral infections must run their course. These infections will not respond to antibiotics.  HOME CARE INSTRUCTIONS   Cough suppressants may be used if you are losing too much rest. However, coughing protects you by clearing your lungs. You should avoid using cough suppressants if you can.  Your health care provider may have prescribed medicine if he or she thinks your pneumonia is caused by bacteria or influenza. Finish your medicine even if you start to feel better.  Your health care provider may also prescribe an expectorant. This loosens the mucus to be coughed up.  Take medicines only as directed by your health care provider.  Do not smoke. Smoking is a common cause of bronchitis and can contribute to pneumonia. If you are a smoker and continue to smoke, your cough may last several weeks after your  pneumonia has cleared.  A cold steam vaporizer or humidifier in your room or home may help loosen mucus.  Coughing is often worse at night. Sleeping in a semi-upright position in a recliner or using a couple pillows under your head will help with this.  Get rest as you feel it is needed. Your body will usually let you know when you need to rest. PREVENTION A pneumococcal shot (vaccine) is available to prevent a common bacterial cause of pneumonia. This is usually suggested for:  People over 65 years old.  Patients on chemotherapy.  People with chronic lung problems, such as bronchitis or emphysema.  People with immune system problems. If you are over 65 or have a high risk condition, you may receive the pneumococcal vaccine if you have not received it before. In some countries, a routine influenza vaccine is also recommended. This vaccine can help prevent some cases of pneumonia.You may be offered the influenza vaccine as part of your care. If you smoke, it is time to quit. You may receive instructions on how to stop smoking. Your health care provider can provide medicines and counseling to help you quit. SEEK MEDICAL CARE IF: You have a fever. SEEK IMMEDIATE MEDICAL CARE IF:   Your illness becomes worse. This is especially true if you are elderly or weakened from any other disease.  You cannot control your cough with suppressants and are losing sleep.  You begin coughing up blood.  You develop pain which is getting worse or is uncontrolled with medicines.  Any of the symptoms   which initially brought you in for treatment are getting worse rather than better.  You develop shortness of breath or chest pain. MAKE SURE YOU:   Understand these instructions.  Will watch your condition.  Will get help right away if you are not doing well or get worse. Document Released: 12/31/2004 Document Revised: 05/17/2013 Document Reviewed: 03/22/2010 [facility] Patient Information 2015  [place], [place]. This information is not intended to replace advice given to you by your health care provider. Make sure you discuss any questions you have with your health care provider.  Incidental Abnormal Radiological Finding An incidental abnormal radiologic finding is a very small mass or scar tissue, detected anywhere in the body, unrelated to the reason for your visit. With newer imaging tests and technologies, it is becoming more common to detect small masses and tissue abnormalities. It is important for you to work with your caregiver because it can sometimes be related to undiagnosed illness or other symptoms. Most often however, the finding is not causing symptoms and is not a cause for concern. TYPES OF FINDINGS Abnormal radiologic findings are often located in the kidneys or lungs, but they can also be found in the heart, liver, breasts, brain, gallbladder, uterus and other surrounding organs and tissues. There are many types of masses and tissue abnormalities that can be detected during an imaging test. These may include:  Lesions - changes in tissue due to infection, tissue death, or trauma.  Cysts - a sac filled with fluid, crystals, or some other substance.  Tumors - non-cancerous or cancerous solid formation. You may hear medical terms, such as, "pulmonary nodule" (a small mass in the lung) or "renal mass" (mass in the kidney). Ask your caregiver if these terms apply to your findings.  DO I NEED FURTHER DIAGNOSIS? There are many possible causes of incidental radiologic findings. Your caregiver will determine whether it requires additional screening tests, diagnostic tests, treatments, or referral to a surgeon.Generally, very small tissue changes or masses will not require any follow up testing. Much research has been done in this area.These very small abnormalities are considered low risk of becoming a problem in the future.  Depending on the size and appearance of the finding,  your caregiver may recommend additional testing.Additional testing may also be recommended if you have certain risk factors or medical conditions that increase your risk of related problems. It is a good idea to have additional testing if you have other symptoms or concerns. Sometimes, these early findings can give you a chance at early treatment and avoid problems in the future. TESTING AND DIAGNOSIS Tests and exams may be a one-time screening or periodic follow-up. Periodic follow-up will help your caregiver determine whether the abnormality is growing and becoming a concern. Tests may include:  Physical examination.  Blood tests.  Urine tests.  Imaging tests, such as abdominal ultrasound, CT scan, or MRI.  Biopsy. TREATMENT Treatment varies, depending on the cause, location, size and appearance of the finding. Treatment will also depend on your age and underlying conditions or symptoms. Sometimes treatment is not necessary at all. However, treatment may include:  Watchful waiting with periodic examination and testing.  Treatments to reduce the size of the abnormality.  Surgical or biopsy removal of the abnormality.  Additional treatments to address any underlying conditions. HOME CARE INSTRUCTIONS   See your caregiver for follow up examination and testing as directed. It is important that you schedule appointments as directed.  Keep calm. Your caregiver will let you know  if this is a routine follow up, or if there is a reason for concern. Remember, early detection can be very beneficial to you.  Follow all of your caregiver's aftercare instructions related to the reason for your visit. Document Released: 04/17/2010 Document Revised: 03/25/2011 Document Reviewed: 04/17/2010 [facility] Patient Information 2015 [place], [place]. This information is not intended to replace advice given to you by your health care provider. Make sure you discuss any questions you have with your health care  provider.

## 2014-04-30 ENCOUNTER — Observation Stay (HOSPITAL_COMMUNITY)
Admission: EM | Admit: 2014-04-30 | Discharge: 2014-05-02 | Disposition: A | Discharge status: Home / Self Care | Payer: Medicare Other | Attending: Internal Medicine | Admitting: Internal Medicine

## 2014-04-30 ENCOUNTER — Encounter (HOSPITAL_COMMUNITY): Payer: Self-pay | Admitting: Emergency Medicine

## 2014-04-30 ENCOUNTER — Emergency Department (HOSPITAL_COMMUNITY): Payer: Medicare Other

## 2014-04-30 DIAGNOSIS — Z79891 Long term (current) use of opiate analgesic: Secondary | ICD-10-CM | POA: Diagnosis not present

## 2014-04-30 DIAGNOSIS — F17200 Nicotine dependence, unspecified, uncomplicated: Secondary | ICD-10-CM | POA: Diagnosis present

## 2014-04-30 DIAGNOSIS — F419 Anxiety disorder, unspecified: Secondary | ICD-10-CM | POA: Insufficient documentation

## 2014-04-30 DIAGNOSIS — J449 Chronic obstructive pulmonary disease, unspecified: Secondary | ICD-10-CM | POA: Diagnosis not present

## 2014-04-30 DIAGNOSIS — E871 Hypo-osmolality and hyponatremia: Secondary | ICD-10-CM

## 2014-04-30 DIAGNOSIS — R911 Solitary pulmonary nodule: Secondary | ICD-10-CM | POA: Diagnosis present

## 2014-04-30 DIAGNOSIS — M1990 Unspecified osteoarthritis, unspecified site: Secondary | ICD-10-CM | POA: Insufficient documentation

## 2014-04-30 DIAGNOSIS — I10 Essential (primary) hypertension: Secondary | ICD-10-CM | POA: Diagnosis present

## 2014-04-30 DIAGNOSIS — R110 Nausea: Secondary | ICD-10-CM

## 2014-04-30 DIAGNOSIS — N179 Acute kidney failure, unspecified: Secondary | ICD-10-CM | POA: Diagnosis not present

## 2014-04-30 DIAGNOSIS — J42 Unspecified chronic bronchitis: Secondary | ICD-10-CM | POA: Diagnosis not present

## 2014-04-30 DIAGNOSIS — R112 Nausea with vomiting, unspecified: Secondary | ICD-10-CM | POA: Diagnosis present

## 2014-04-30 DIAGNOSIS — E780 Pure hypercholesterolemia: Secondary | ICD-10-CM | POA: Insufficient documentation

## 2014-04-30 DIAGNOSIS — F329 Major depressive disorder, single episode, unspecified: Secondary | ICD-10-CM | POA: Insufficient documentation

## 2014-04-30 DIAGNOSIS — F17210 Nicotine dependence, cigarettes, uncomplicated: Secondary | ICD-10-CM | POA: Diagnosis not present

## 2014-04-30 DIAGNOSIS — K219 Gastro-esophageal reflux disease without esophagitis: Secondary | ICD-10-CM | POA: Insufficient documentation

## 2014-04-30 DIAGNOSIS — I959 Hypotension, unspecified: Secondary | ICD-10-CM | POA: Diagnosis present

## 2014-04-30 DIAGNOSIS — I951 Orthostatic hypotension: Secondary | ICD-10-CM | POA: Diagnosis not present

## 2014-04-30 DIAGNOSIS — Z791 Long term (current) use of non-steroidal anti-inflammatories (NSAID): Secondary | ICD-10-CM | POA: Insufficient documentation

## 2014-04-30 DIAGNOSIS — N289 Disorder of kidney and ureter, unspecified: Secondary | ICD-10-CM | POA: Insufficient documentation

## 2014-04-30 DIAGNOSIS — F39 Unspecified mood [affective] disorder: Secondary | ICD-10-CM | POA: Diagnosis present

## 2014-04-30 DIAGNOSIS — Z79899 Other long term (current) drug therapy: Secondary | ICD-10-CM | POA: Diagnosis not present

## 2014-04-30 DIAGNOSIS — Z720 Tobacco use: Secondary | ICD-10-CM

## 2014-04-30 DIAGNOSIS — J9611 Chronic respiratory failure with hypoxia: Secondary | ICD-10-CM | POA: Diagnosis not present

## 2014-04-30 DIAGNOSIS — E876 Hypokalemia: Secondary | ICD-10-CM

## 2014-04-30 DIAGNOSIS — R531 Weakness: Secondary | ICD-10-CM | POA: Diagnosis present

## 2014-04-30 HISTORY — DX: Hypotension, unspecified: I95.9

## 2014-04-30 HISTORY — DX: Essential (primary) hypertension: I10

## 2014-04-30 HISTORY — DX: Hypokalemia: E87.6

## 2014-04-30 HISTORY — DX: Solitary pulmonary nodule: R91.1

## 2014-04-30 HISTORY — DX: Acute kidney failure, unspecified: N17.9

## 2014-04-30 HISTORY — DX: Hypo-osmolality and hyponatremia: E87.1

## 2014-04-30 HISTORY — DX: Nausea: R11.0

## 2014-04-30 LAB — COMPREHENSIVE METABOLIC PANEL
ALBUMIN: 3 g/dL — AB (ref 3.5–5.2)
ALK PHOS: 95 U/L (ref 39–117)
ALT: 11 U/L (ref 0–35)
ALT: 11 U/L (ref 0–35)
ANION GAP: 9 (ref 5–15)
AST: 16 U/L (ref 0–37)
AST: 15 U/L (ref 0–37)
Albumin: 3.5 g/dL (ref 3.5–5.2)
Alkaline Phosphatase: 107 U/L (ref 39–117)
Anion gap: 10 (ref 5–15)
BILIRUBIN TOTAL: 0.8 mg/dL (ref 0.3–1.2)
BILIRUBIN TOTAL: 0.6 mg/dL (ref 0.3–1.2)
BUN: 31 mg/dL — ABNORMAL HIGH (ref 6–23)
BUN: 27 mg/dL — AB (ref 6–23)
CO2: 29 mmol/L (ref 19–32)
CO2: 29 mmol/L (ref 19–32)
CREATININE: 1.25 mg/dL — AB (ref 0.50–1.10)
Calcium: 8.2 mg/dL — ABNORMAL LOW (ref 8.4–10.5)
Calcium: 8 mg/dL — ABNORMAL LOW (ref 8.4–10.5)
Chloride: 87 mmol/L — ABNORMAL LOW (ref 96–112)
Chloride: 94 mmol/L — ABNORMAL LOW (ref 96–112)
Creatinine, Ser: 1.46 mg/dL — ABNORMAL HIGH (ref 0.50–1.10)
GFR calc Af Amer: 41 mL/min — ABNORMAL LOW (ref >90)
GFR calc Af Amer: 49 mL/min — ABNORMAL LOW (ref >90)
GFR calc non Af Amer: 35 mL/min — ABNORMAL LOW (ref >90)
GFR calc non Af Amer: 42 mL/min — ABNORMAL LOW (ref >90)
Glucose, Bld: 105 mg/dL — ABNORMAL HIGH (ref 70–99)
Glucose, Bld: 115 mg/dL — ABNORMAL HIGH (ref 70–99)
POTASSIUM: 2.7 mmol/L — AB (ref 3.5–5.1)
Potassium: 3 mmol/L — ABNORMAL LOW (ref 3.5–5.1)
Sodium: 126 mmol/L — ABNORMAL LOW (ref 135–145)
Sodium: 132 mmol/L — ABNORMAL LOW (ref 135–145)
Total Protein: 6.6 g/dL (ref 6.0–8.3)
Total Protein: 6 g/dL (ref 6.0–8.3)

## 2014-04-30 LAB — I-STAT CG4 LACTIC ACID, ED
Lactic Acid, Venous: 0.87 mmol/L (ref 0.5–2.0)
Lactic Acid, Venous: 0.7 mmol/L (ref 0.5–2.0)

## 2014-04-30 LAB — CBC WITH DIFFERENTIAL/PLATELET
Basophils Absolute: 0 10*3/uL (ref 0.0–0.1)
Basophils Relative: 0 % (ref 0–1)
Eosinophils Absolute: 0 10*3/uL (ref 0.0–0.7)
Eosinophils Relative: 0 % (ref 0–5)
HCT: 30.9 % — ABNORMAL LOW (ref 36.0–46.0)
Hemoglobin: 10.8 g/dL — ABNORMAL LOW (ref 12.0–15.0)
LYMPHS ABS: 1.1 10*3/uL (ref 0.7–4.0)
Lymphocytes Relative: 9 % — ABNORMAL LOW (ref 12–46)
MCH: 30.4 pg (ref 26.0–34.0)
MCHC: 35 g/dL (ref 30.0–36.0)
MCV: 87 fL (ref 78.0–100.0)
Monocytes Absolute: 1.9 10*3/uL — ABNORMAL HIGH (ref 0.1–1.0)
Monocytes Relative: 16 % — ABNORMAL HIGH (ref 3–12)
NEUTROS ABS: 9 10*3/uL — AB (ref 1.7–7.7)
NEUTROS PCT: 75 % (ref 43–77)
PLATELETS: 191 10*3/uL (ref 150–400)
RBC: 3.55 MIL/uL — ABNORMAL LOW (ref 3.87–5.11)
RDW: 13.4 % (ref 11.5–15.5)
WBC: 12 10*3/uL — AB (ref 4.0–10.5)

## 2014-04-30 LAB — URINALYSIS, ROUTINE W REFLEX MICROSCOPIC
Bilirubin Urine: NEGATIVE
Glucose, UA: NEGATIVE mg/dL
Hgb urine dipstick: NEGATIVE
KETONES UR: NEGATIVE mg/dL
Leukocytes, UA: NEGATIVE
Nitrite: NEGATIVE
Protein, ur: NEGATIVE mg/dL
SPECIFIC GRAVITY, URINE: 1.005 (ref 1.005–1.030)
UROBILINOGEN UA: 0.2 mg/dL (ref 0.0–1.0)
pH: 6.5 (ref 5.0–8.0)

## 2014-04-30 LAB — MAGNESIUM: Magnesium: 1.7 mg/dL (ref 1.5–2.5)

## 2014-04-30 MED ORDER — ACETAMINOPHEN 650 MG RE SUPP: 650.0000 mg | Freq: Four times a day (QID) | RECTAL | Status: DC | PRN

## 2014-04-30 MED ORDER — BUDESONIDE 0.25 MG/2ML IN SUSP
0.2500 mg | Freq: Two times a day (BID) | RESPIRATORY_TRACT | Status: DC
  Administered 2014-04-30 – 2014-05-02 (×4): 0.25 mg via RESPIRATORY_TRACT
  Filled 2014-04-30 (×4): qty 2

## 2014-04-30 MED ORDER — SIMVASTATIN 20 MG PO TABS
20.0000 mg | ORAL_TABLET | Freq: Every day | ORAL | Status: DC
  Administered 2014-04-30 – 2014-05-01 (×2): 20 mg via ORAL
  Filled 2014-04-30 (×2): qty 1

## 2014-04-30 MED ORDER — ALBUTEROL SULFATE HFA 108 (90 BASE) MCG/ACT IN AERS: 2.0000 | INHALATION_SPRAY | Freq: Four times a day (QID) | RESPIRATORY_TRACT | Status: DC | PRN

## 2014-04-30 MED ORDER — SODIUM CHLORIDE 0.9 % IV SOLN
1000.0000 mL | INTRAVENOUS | Status: DC
Start: 2014-04-30 — End: 2014-04-30
  Administered 2014-04-30: 1000 mL via INTRAVENOUS

## 2014-04-30 MED ORDER — ONDANSETRON HCL 4 MG PO TABS: 4.0000 mg | ORAL_TABLET | Freq: Four times a day (QID) | ORAL | Status: DC | PRN

## 2014-04-30 MED ORDER — SODIUM CHLORIDE 0.9 % IJ SOLN
3.0000 mL | Freq: Two times a day (BID) | INTRAMUSCULAR | Status: DC
  Administered 2014-04-30: 3 mL via INTRAVENOUS

## 2014-04-30 MED ORDER — ACETAMINOPHEN 325 MG PO TABS: 650.0000 mg | ORAL_TABLET | Freq: Four times a day (QID) | ORAL | Status: DC | PRN

## 2014-04-30 MED ORDER — POTASSIUM CHLORIDE 10 MEQ/100ML IV SOLN
10.0000 meq | Freq: Once | INTRAVENOUS | Status: AC
  Administered 2014-04-30: 10 meq via INTRAVENOUS
  Filled 2014-04-30: qty 100

## 2014-04-30 MED ORDER — FLUOXETINE HCL 20 MG PO CAPS
40.0000 mg | ORAL_CAPSULE | Freq: Every morning | ORAL | Status: DC
  Administered 2014-05-01 – 2014-05-02 (×2): 40 mg via ORAL
  Filled 2014-04-30 (×2): qty 2

## 2014-04-30 MED ORDER — ONDANSETRON HCL 4 MG/2ML IJ SOLN: 4.0000 mg | Freq: Four times a day (QID) | INTRAMUSCULAR | Status: DC | PRN

## 2014-04-30 MED ORDER — SODIUM CHLORIDE 0.9 % IV SOLN
1000.0000 mL | Freq: Once | INTRAVENOUS | Status: AC
  Administered 2014-04-30: 1000 mL via INTRAVENOUS

## 2014-04-30 MED ORDER — ONDANSETRON 4 MG PO TBDP: 4.0000 mg | ORAL_TABLET | Freq: Three times a day (TID) | ORAL | Status: DC | PRN

## 2014-04-30 MED ORDER — ALBUTEROL SULFATE (2.5 MG/3ML) 0.083% IN NEBU: 2.5000 mg | INHALATION_SOLUTION | RESPIRATORY_TRACT | Status: DC | PRN

## 2014-04-30 MED ORDER — NICOTINE 14 MG/24HR TD PT24
14.0000 mg | MEDICATED_PATCH | Freq: Every day | TRANSDERMAL | Status: DC
  Administered 2014-05-01 – 2014-05-02 (×3): 14 mg via TRANSDERMAL
  Filled 2014-04-30 (×3): qty 1

## 2014-04-30 MED ORDER — PANTOPRAZOLE SODIUM 40 MG PO TBEC
40.0000 mg | DELAYED_RELEASE_TABLET | Freq: Every day | ORAL | Status: DC
  Administered 2014-04-30 – 2014-05-02 (×3): 40 mg via ORAL
  Filled 2014-04-30 (×3): qty 1

## 2014-04-30 MED ORDER — HYDROCODONE-ACETAMINOPHEN 5-325 MG PO TABS
1.0000 | ORAL_TABLET | ORAL | Status: DC | PRN
  Administered 2014-05-01 (×3): 1 via ORAL
  Filled 2014-04-30 (×3): qty 1

## 2014-04-30 MED ORDER — ALPRAZOLAM 1 MG PO TABS
1.0000 mg | ORAL_TABLET | Freq: Three times a day (TID) | ORAL | Status: DC | PRN
  Administered 2014-04-30 – 2014-05-02 (×5): 1 mg via ORAL
  Filled 2014-04-30 (×5): qty 1

## 2014-04-30 MED ORDER — POTASSIUM CHLORIDE CRYS ER 20 MEQ PO TBCR
40.0000 meq | EXTENDED_RELEASE_TABLET | Freq: Once | ORAL | Status: AC
  Administered 2014-04-30: 40 meq via ORAL
  Filled 2014-04-30: qty 2

## 2014-04-30 MED ORDER — IPRATROPIUM BROMIDE 0.02 % IN SOLN
0.5000 mg | Freq: Four times a day (QID) | RESPIRATORY_TRACT | Status: DC
  Administered 2014-04-30 – 2014-05-02 (×6): 0.5 mg via RESPIRATORY_TRACT
  Filled 2014-04-30 (×7): qty 2.5

## 2014-04-30 MED ORDER — HEPARIN SODIUM (PORCINE) 5000 UNIT/ML IJ SOLN
5000.0000 [IU] | Freq: Three times a day (TID) | INTRAMUSCULAR | Status: DC
  Administered 2014-04-30 – 2014-05-01 (×4): 5000 [IU] via SUBCUTANEOUS
  Filled 2014-04-30 (×6): qty 1

## 2014-04-30 MED ORDER — SODIUM CHLORIDE 0.9 % IV SOLN
INTRAVENOUS | Status: DC
  Administered 2014-05-02: 04:00:00 via INTRAVENOUS

## 2014-04-30 MED ORDER — GUAIFENESIN ER 600 MG PO TB12
1200.0000 mg | ORAL_TABLET | Freq: Two times a day (BID) | ORAL | Status: DC | PRN
  Administered 2014-05-01: 1200 mg via ORAL
  Filled 2014-04-30: qty 2

## 2014-04-30 NOTE — ED Notes (Signed)
Bed: IX18 Expected date:  Expected time:  Means of arrival:  Comments: Low bp

## 2014-04-30 NOTE — H&P (Signed)
[facility] History and Physical  Patient: Allison Vaughan  MRN: [id number]  DOB: August 25, 1945  DOS: the patient was seen and examined on 04/30/2014 PCP: Antonietta Jewel, MD  Chief Complaint: Generalized weakness  HPI: [name] is a 69 y.o. female with Past medical history of chronic pain, COPD, mood disorder, active smoker, chronic hypoxia, recent admission for COPD exacerbation as well as community-acquired pneumonia, pulmonary nodule. The patient presented with complaints of generalized weakness. Patient was recently admitted in the hospital with complaints of nausea vomiting and diarrhea which was felt to be viral in nature. She had acute kidney injury with hypokalemia which improved with IV hydration and holding diuresis as well as ARB. After being discharged to home she continues to have generalized weakness and was taking her regular medications at home including her blood pressure medications as well as hydrochlorothiazide. She mentions she has been feeling dizzy and lightheaded without any vertigo. She denies any complaints of chest pain or abdominal pain. She continues to have nausea but no vomiting. She continues to have those bowel movement 1 a day which are black in color. She denies any burning urination. She has not been able to eat significantly this discharge secondary to nausea. She has completed the course of antibiotics. She's taking the naproxen twice a day.  The patient is coming from home. And at her baseline independent for most of her ADL.  Review of Systems: as mentioned in the history of present illness.  A comprehensive review of the other systems is negative.  Past Medical History  Diagnosis Date  . Chronic pain   . Anxiety   . COPD (chronic obstructive pulmonary disease)   . CAP (community acquired pneumonia) 04/18/2014  . Lung nodule 04/16/2014  . OSTEOARTHRITIS, MULTI SITES 03/13/2006    Qualifier: Diagnosis of  By: Damita Dunnings MD, Leota Sauers SYNDROME 03/13/2006    Qualifier: Diagnosis of  By: Damita Dunnings MD, Phillip Heal    . Irritable bowel syndrome 03/13/2006    Qualifier: Diagnosis of  By: Damita Dunnings MD, [place], NOS 03/13/2006    Qualifier: Diagnosis of  By: Damita Dunnings MD, Isac Caddy DEPENDENCE 03/13/2006    Qualifier: Diagnosis of  By: Damita Dunnings MD, Phillip Heal    . Chronic hypoxemic respiratory failure 06/26/2012  . GASTROESOPHAGEAL REFLUX, NO ESOPHAGITIS 03/13/2006    Qualifier: Diagnosis of  By: Damita Dunnings MD, Phillip Heal    . HYPERTENSION, BENIGN SYSTEMIC 03/13/2006    Qualifier: Diagnosis of  By: Damita Dunnings MD, Phillip Heal    . HYPERCHOLESTEROLEMIA 03/13/2006    Qualifier: Diagnosis of  By: Damita Dunnings MD, Phillip Heal     Past Surgical History  Procedure Laterality Date  . Eye surgery     Social History:  reports that she has been smoking Cigarettes.  She has been smoking about 1.00 pack per day. She does not have any smokeless tobacco history on file. She reports that she drinks about 1.2 oz of alcohol per week. She reports that she does not use illicit drugs.  No Known Allergies  History reviewed. No pertinent family history.  Prior to Admission medications   Medication Sig Start Date End Date Taking? Authorizing Provider  albuterol (PROVENTIL HFA;VENTOLIN HFA) 108 (90 BASE) MCG/ACT inhaler Inhale 2 puffs into the lungs every 6 (six) hours as needed for wheezing. 06/09/12  Yes Monika Salk, MD  albuterol (PROVENTIL) (2.5 MG/3ML) 0.083% nebulizer solution Take 3 mLs (2.5 mg total) by nebulization every 2 (  two) hours as needed for wheezing. 04/20/14  Yes Venetia Maxon Rama, MD  ALPRAZolam Duanne Moron) 1 MG tablet Take 1 mg by mouth 3 (three) times daily as needed for anxiety or sleep. For anxiety   Yes Historical Provider, MD  amLODipine (NORVASC) 10 MG tablet Take 1 tablet (10 mg total) by mouth daily. 06/09/12  Yes Monika Salk, MD  atenolol (TENORMIN) 50 MG tablet Take 50 mg by mouth daily.   Yes Historical Provider, MD  budesonide  (PULMICORT) 0.25 MG/2ML nebulizer solution Take 2 mLs (0.25 mg total) by nebulization 2 (two) times daily. 04/20/14  Yes Christina P Rama, MD  FLUoxetine (PROZAC) 40 MG capsule Take 40 mg by mouth every morning.    Yes Historical Provider, MD  guaiFENesin (MUCINEX) 600 MG 12 hr tablet Take 1,200 mg by mouth 2 (two) times daily as needed for congestion.   Yes Historical Provider, MD  hydrALAZINE (APRESOLINE) 100 MG tablet Take 1 tablet by mouth 3 (three) times daily. 03/23/14  Yes Historical Provider, MD  ipratropium (ATROVENT) 0.02 % nebulizer solution Take 2.5 mLs (0.5 mg total) by nebulization 4 (four) times daily. 04/20/14  Yes Venetia Maxon Rama, MD  losartan (COZAAR) 100 MG tablet Take 1 tablet by mouth daily. 04/08/14  Yes Historical Provider, MD  naproxen (NAPROSYN) 500 MG tablet Take 1 tablet by mouth 2 (two) times daily as needed for mild pain or moderate pain.  04/26/14  Yes Historical Provider, MD  omeprazole (PRILOSEC) 40 MG capsule Take 1 capsule (40 mg total) by mouth daily. 04/20/14  Yes Christina P Rama, MD  ondansetron (ZOFRAN ODT) 4 MG disintegrating tablet Take 1 tablet (4 mg total) by mouth every 8 (eight) hours as needed for nausea or vomiting. 04/20/14  Yes Venetia Maxon Rama, MD  Oxycodone HCl 10 MG TABS Take 10 mg by mouth 3 (three) times daily as needed. For pain   Yes Historical Provider, MD  simvastatin (ZOCOR) 20 MG tablet Take 1 tablet by mouth at bedtime.  02/16/14  Yes Historical Provider, MD  levofloxacin (LEVAQUIN) 500 MG tablet Take 1.5 tablets (750 mg total) by mouth daily. 04/20/14   Christina P Rama, MD  nicotine (NICODERM CQ - DOSED IN MG/24 HOURS) 14 mg/24hr patch Place 1 patch (14 mg total) onto the skin daily. 04/20/14   Venetia Maxon Rama, MD  predniSONE (DELTASONE) 10 MG tablet Take 1 tablet (10 mg total) by mouth daily with breakfast. 04/20/14   Venetia Maxon Rama, MD    Physical Exam: Filed Vitals:   04/30/14 1811 04/30/14 1919 04/30/14 1948  BP: 105/51 101/46 96/54  Pulse: 78      Temp: 98.2 F (36.8 C)    TempSrc: Oral    Resp: 18 15 18   SpO2: 98%      General: Alert, Awake and Oriented to Time, Place and Person. Appear in mild distress Eyes: PERRL ENT: Oral Mucosa clear dry. Neck: no JVD Cardiovascular: S1 and S2 Present,   aortic systolic Murmur, Peripheral Pulses Present Respiratory: Bilateral Air entry equal and Decreased,  Clear to Auscultation, no Crackles, no wheezes Abdomen: Bowel Sound present, Soft and non tender Skin: no Rash Extremities: no Pedal edema, no calf tenderness Neurologic: Grossly no focal neuro deficit.  Labs on Admission:  CBC:  Recent Labs Lab 04/30/14 1830  WBC 12.0*  NEUTROABS 9.0*  HGB 10.8*  HCT 30.9*  MCV 87.0  PLT 191    CMP     Component Value Date/Time   NA 126*  04/30/2014 1830   K 2.7* 04/30/2014 1830   CL 87* 04/30/2014 1830   CO2 29 04/30/2014 1830   GLUCOSE 105* 04/30/2014 1830   BUN 31* 04/30/2014 1830   CREATININE 1.46* 04/30/2014 1830   CALCIUM 8.2* 04/30/2014 1830   PROT 6.6 04/30/2014 1830   ALBUMIN 3.5 04/30/2014 1830   AST 16 04/30/2014 1830   ALT 11 04/30/2014 1830   ALKPHOS 107 04/30/2014 1830   BILITOT 0.8 04/30/2014 1830   GFRNONAA 35* 04/30/2014 1830   GFRAA 41* 04/30/2014 1830    No results for input(s): LIPASE, AMYLASE in the last 168 hours.  No results for input(s): CKTOTAL, CKMB, CKMBINDEX, TROPONINI in the last 168 hours. BNP (last 3 results) No results for input(s): BNP in the last 8760 hours.  ProBNP (last 3 results) No results for input(s): PROBNP in the last 8760 hours.   Radiological Exams on Admission: Dg Chest 2 View  04/30/2014   CLINICAL DATA:  Hypotension and weakness. Symptoms for 1 month. History of COPD. Lung nodule.  EXAM: CHEST  2 VIEW  COMPARISON:  Chest and rib radiographs 04/17/2014, included lung bases from CT abdomen 04/15/2014  FINDINGS: The nodular opacity at the posterior medial right lung base on prior CT is not well seen radiographically.  Cardiomegaly and tortuous thoracic aorta, unchanged. Increased bronchovascular markings, appear chronic and unchanged. No confluent airspace disease. No pulmonary edema, pleural effusion or pneumothorax. The bones are under mineralized. Cortical irregularity of right anterior lateral fifth rib is again seen.  IMPRESSION: 1.  No acute pulmonary process. 2. The nodular opacity in the posterior medial right lung based on prior CT is not well seen radiographically. Unchanged right anterior lateral right rib fracture.   Electronically Signed   By: Jeb Levering M.D.   On: 04/30/2014 18:58    Assessment/Plan Principal Problem:   AKI (acute kidney injury) Active Problems:   TOBACCO DEPENDENCE   Mood disorder   Chronic hypoxemic respiratory failure   COPD (chronic obstructive pulmonary disease)   Pulmonary nodule   Essential hypertension   Hypokalemia   Hyponatremia   Hypotension   Nausea   1. AKI (acute kidney injury) The patient is presenting with complaints of generalized weakness. She has also dizziness and lightheadedness when she is trying to change her position. She has her blood pressure running low throughout the day. Her daughter made her come to the hospital. She appears clinically dehydrated and has low blood pressure with orthostasis. A blood pressure at the time of my evaluation was 810 systolic suggesting she is volume responsive. With this I would continue her on IV hydration overnight. I would hold all her blood pressure medication. Patient requested to stop taking hydrochlorothiazide.  2. hypokalemia, hyponatremia. Potassium level has been replaced. Sodium level is likely low secondary to dehydration. We will recheck CMP. Adjust fluid accordingly.  3. COPD with recent exacerbation. At present does not appear to be in excess her patient. Continue home nebulizers and inhalers.  4. more disorder. Continue home medications.  5. smoking. Patient requested to quit  smoking. She mentions nicotine patch are working for her but her insurance cannot qualify for it to be taken as an outpatient. Currently we will continue with nicotine patch and request case manager consultation.  6. Pulmonary nodule. Workup pending as an outpatient.  Advance goals of care discussion: Full code  DVT Prophylaxis: subcutaneous Heparin Nutrition: Cardiac diet  Disposition: Admitted as inpatient, telemetry unit.  Author: Berle Mull, MD [facility] Pager:  [phone] 04/30/2014  If 7PM-7AM, please contact night-coverage [email or web address] Password [email or web address]

## 2014-04-30 NOTE — ED Notes (Signed)
Per RN hold labs due to IV start, will obtain labs then.

## 2014-04-30 NOTE — ED Notes (Addendum)
Pt admits to taking medication for BP, a diuretic. She states she was suppose to take a potassium supplement but ran out of this medication a while ago. She states he daughter has not been to pick them up. When asked patient if she has noticed any confusion, she states just a little.

## 2014-04-30 NOTE — ED Notes (Signed)
Patient transported to X-ray 

## 2014-04-30 NOTE — ED Notes (Signed)
PER EMS- pt picked up from home c/o diarrhea and emesis x3weeks. Pt was evaluated and admitted x6days here at [facility].  Pt was discharged and sent home to self care with no relief. C/o low blood pressure and dizziness while standing. Pt alert and oriented per baseline. Pt was seen by PCP x2days.

## 2014-04-30 NOTE — ED Provider Notes (Signed)
CSN: [id number]     Arrival date & time 04/30/14  1803 History   First MD Initiated Contact with Patient 04/30/14 1810     Chief Complaint  Patient presents with  . Hypotension    HPI Pt presents with generalized weakness and low blood pressure.  She was recently admitted to the hospital earlier this month. At that time she was having trouble with vomiting, diarrhea and cough. She was found to be hypotensive and noted to have acute renal injury.  Patient was admitted additionally diagnosed with pneumonia and treated with IV fluids and antibiotics.  She has been home recovering but does not feel like she has been getting better as expected.  She has not had any more vomiting.  She had one loose stool today which is improved from prior to her hospitalization.  She has started smoking again bc she could not get the nicotine patches from her insurance.  She coughs on occasion but not much. No SOB.  No CP.  She took her BP today and it was low.  She was feeling weak so she came to the ED.  Pt does take BP meds and she has been continuing to take them since her discharge. Past Medical History  Diagnosis Date  . Chronic pain   . Anxiety   . COPD (chronic obstructive pulmonary disease)   . CAP (community acquired pneumonia) 04/18/2014  . Lung nodule 04/16/2014  . OSTEOARTHRITIS, MULTI SITES 03/13/2006    Qualifier: Diagnosis of  By: Damita Dunnings MD, Leota Sauers SYNDROME 03/13/2006    Qualifier: Diagnosis of  By: Damita Dunnings MD, Phillip Heal    . Irritable bowel syndrome 03/13/2006    Qualifier: Diagnosis of  By: Damita Dunnings MD, [place], NOS 03/13/2006    Qualifier: Diagnosis of  By: Damita Dunnings MD, Isac Caddy DEPENDENCE 03/13/2006    Qualifier: Diagnosis of  By: Damita Dunnings MD, Phillip Heal    . Chronic hypoxemic respiratory failure 06/26/2012  . GASTROESOPHAGEAL REFLUX, NO ESOPHAGITIS 03/13/2006    Qualifier: Diagnosis of  By: Damita Dunnings MD, Phillip Heal    . HYPERTENSION, BENIGN SYSTEMIC 03/13/2006     Qualifier: Diagnosis of  By: Damita Dunnings MD, Phillip Heal    . HYPERCHOLESTEROLEMIA 03/13/2006    Qualifier: Diagnosis of  By: Damita Dunnings MD, Phillip Heal     Past Surgical History  Procedure Laterality Date  . Eye surgery     History reviewed. No pertinent family history. History  Substance Use Topics  . Smoking status: Current Every Day Smoker -- 1.00 packs/day    Types: Cigarettes  . Smokeless tobacco: Not on file  . Alcohol Use: 1.2 oz/week    2 Cans of beer per week     Comment: per week   OB History    No data available     Review of Systems  All other systems reviewed and are negative.     Allergies  Review of patient's allergies indicates no known allergies.  Home Medications   Prior to Admission medications   Medication Sig Start Date End Date Taking? Authorizing Provider  albuterol (PROVENTIL HFA;VENTOLIN HFA) 108 (90 BASE) MCG/ACT inhaler Inhale 2 puffs into the lungs every 6 (six) hours as needed for wheezing. 06/09/12  Yes Monika Salk, MD  albuterol (PROVENTIL) (2.5 MG/3ML) 0.083% nebulizer solution Take 3 mLs (2.5 mg total) by nebulization every 2 (two) hours as needed for wheezing. 04/20/14  Yes Venetia Maxon Rama, MD  ALPRAZolam Duanne Moron) 1  MG tablet Take 1 mg by mouth 3 (three) times daily as needed for anxiety or sleep. For anxiety   Yes Historical Provider, MD  amLODipine (NORVASC) 10 MG tablet Take 1 tablet (10 mg total) by mouth daily. 06/09/12  Yes Monika Salk, MD  atenolol (TENORMIN) 50 MG tablet Take 50 mg by mouth daily.   Yes Historical Provider, MD  budesonide (PULMICORT) 0.25 MG/2ML nebulizer solution Take 2 mLs (0.25 mg total) by nebulization 2 (two) times daily. 04/20/14  Yes Christina P Rama, MD  FLUoxetine (PROZAC) 40 MG capsule Take 40 mg by mouth every morning.    Yes Historical Provider, MD  guaiFENesin (MUCINEX) 600 MG 12 hr tablet Take 1,200 mg by mouth 2 (two) times daily as needed for congestion.   Yes Historical Provider, MD  hydrALAZINE (APRESOLINE) 100 MG tablet  Take 1 tablet by mouth 3 (three) times daily. 03/23/14  Yes Historical Provider, MD  hydrochlorothiazide (HYDRODIURIL) 25 MG tablet Take 25 mg by mouth daily.   Yes Historical Provider, MD  ipratropium (ATROVENT) 0.02 % nebulizer solution Take 2.5 mLs (0.5 mg total) by nebulization 4 (four) times daily. 04/20/14  Yes Venetia Maxon Rama, MD  losartan (COZAAR) 100 MG tablet Take 1 tablet by mouth daily. 04/08/14  Yes Historical Provider, MD  naproxen (NAPROSYN) 500 MG tablet Take 1 tablet by mouth 2 (two) times daily as needed for mild pain or moderate pain.  04/26/14  Yes Historical Provider, MD  omeprazole (PRILOSEC) 40 MG capsule Take 1 capsule (40 mg total) by mouth daily. 04/20/14  Yes Christina P Rama, MD  ondansetron (ZOFRAN ODT) 4 MG disintegrating tablet Take 1 tablet (4 mg total) by mouth every 8 (eight) hours as needed for nausea or vomiting. 04/20/14  Yes Venetia Maxon Rama, MD  Oxycodone HCl 10 MG TABS Take 10 mg by mouth 3 (three) times daily as needed. For pain   Yes Historical Provider, MD  simvastatin (ZOCOR) 20 MG tablet Take 1 tablet by mouth at bedtime.  02/16/14  Yes Historical Provider, MD  levofloxacin (LEVAQUIN) 500 MG tablet Take 1.5 tablets (750 mg total) by mouth daily. 04/20/14   Christina P Rama, MD  nicotine (NICODERM CQ - DOSED IN MG/24 HOURS) 14 mg/24hr patch Place 1 patch (14 mg total) onto the skin daily. 04/20/14   Venetia Maxon Rama, MD  predniSONE (DELTASONE) 10 MG tablet Take 1 tablet (10 mg total) by mouth daily with breakfast. 04/20/14   Venetia Maxon Rama, MD   BP 96/54 mmHg  Pulse 78  Temp(Src) 98.2 F (36.8 C) (Oral)  Resp 18  SpO2 98% Physical Exam  Constitutional: No distress.  HENT:  Head: Normocephalic and atraumatic.  Right Ear: External ear normal.  Left Ear: External ear normal.  Eyes: Conjunctivae are normal. Right eye exhibits no discharge. Left eye exhibits no discharge. No scleral icterus.  Neck: Neck supple. No tracheal deviation present.  Cardiovascular: Normal  rate, regular rhythm and intact distal pulses.   Pulmonary/Chest: Effort normal and breath sounds normal. No stridor. No respiratory distress. She has no wheezes. She has no rales.  occsnl wheeze  Abdominal: Soft. Bowel sounds are normal. She exhibits no distension. There is no tenderness. There is no rebound and no guarding.  Musculoskeletal: She exhibits no edema or tenderness.  Neurological: She is alert. She has normal strength. No cranial nerve deficit (no facial droop, extraocular movements intact, no slurred speech) or sensory deficit. She exhibits normal muscle tone. She displays no seizure activity.  Coordination normal.  Skin: Skin is warm and dry. No rash noted.  Psychiatric: She has a normal mood and affect.  Nursing note and vitals reviewed.   ED Course  Procedures (including critical care time) Labs Review Labs Reviewed  CBC WITH DIFFERENTIAL/PLATELET - Abnormal; Notable for the following:    WBC 12.0 (*)    RBC 3.55 (*)    Hemoglobin 10.8 (*)    HCT 30.9 (*)    Neutro Abs 9.0 (*)    Lymphocytes Relative 9 (*)    Monocytes Relative 16 (*)    Monocytes Absolute 1.9 (*)    All other components within normal limits  COMPREHENSIVE METABOLIC PANEL - Abnormal; Notable for the following:    Sodium 126 (*)    Potassium 2.7 (*)    Chloride 87 (*)    Glucose, Bld 105 (*)    BUN 31 (*)    Creatinine, Ser 1.46 (*)    Calcium 8.2 (*)    GFR calc non Af Amer 35 (*)    GFR calc Af Amer 41 (*)    All other components within normal limits  MAGNESIUM  URINALYSIS, ROUTINE W REFLEX MICROSCOPIC  COMPREHENSIVE METABOLIC PANEL  I-STAT CG4 LACTIC ACID, ED  I-STAT CG4 LACTIC ACID, ED    Imaging Review Dg Chest 2 View  04/30/2014   CLINICAL DATA:  Hypotension and weakness. Symptoms for 1 month. History of COPD. Lung nodule.  EXAM: CHEST  2 VIEW  COMPARISON:  Chest and rib radiographs 04/17/2014, included lung bases from CT abdomen 04/15/2014  FINDINGS: The nodular opacity at the  posterior medial right lung base on prior CT is not well seen radiographically. Cardiomegaly and tortuous thoracic aorta, unchanged. Increased bronchovascular markings, appear chronic and unchanged. No confluent airspace disease. No pulmonary edema, pleural effusion or pneumothorax. The bones are under mineralized. Cortical irregularity of right anterior lateral fifth rib is again seen.  IMPRESSION: 1.  No acute pulmonary process. 2. The nodular opacity in the posterior medial right lung based on prior CT is not well seen radiographically. Unchanged right anterior lateral right rib fracture.   Electronically Signed   By: Jeb Levering M.D.   On: 04/30/2014 18:58    Medications  0.9 %  sodium chloride infusion (1,000 mLs Intravenous New Bag/Given 04/30/14 1827)    Followed by  0.9 %  sodium chloride infusion (1,000 mLs Intravenous New Bag/Given 04/30/14 1920)  potassium chloride SA (K-DUR,KLOR-CON) CR tablet 40 mEq (40 mEq Oral Given 04/30/14 1927)  potassium chloride 10 mEq in 100 mL IVPB (10 mEq Intravenous New Bag/Given 04/30/14 1927)     MDM   Final diagnoses:  Orthostatic hypotension  Renal insufficiency  Hypokalemia    The patient has worsening renal insufficiency associated with hypokalemia, hyponatremia and orthostatic hypotension.   The patient has been continuing to take all of her medications for her blood pressure that she had been on previously. I suspect this has contributed to her recurrent renal insufficiency and orthostatic hypotension.  Right now there does not appear to be evidence of a recurrent infection. Patient will be admitted to the hospital for continued treatment and monitoring.    Dorie Rank, MD 04/30/14 2114

## 2014-04-30 NOTE — ED Notes (Addendum)
Family at bedside. Pt daughter states she would like to speak to doctor. She believes pt takes too many medications for BP. States she takes 5 medications for BP.

## 2014-05-01 ENCOUNTER — Encounter (HOSPITAL_COMMUNITY): Payer: Self-pay | Admitting: Radiology

## 2014-05-01 ENCOUNTER — Inpatient Hospital Stay (HOSPITAL_COMMUNITY): Payer: Medicare Other

## 2014-05-01 DIAGNOSIS — E876 Hypokalemia: Secondary | ICD-10-CM | POA: Diagnosis not present

## 2014-05-01 DIAGNOSIS — I959 Hypotension, unspecified: Secondary | ICD-10-CM

## 2014-05-01 DIAGNOSIS — J9611 Chronic respiratory failure with hypoxia: Secondary | ICD-10-CM | POA: Diagnosis not present

## 2014-05-01 DIAGNOSIS — J42 Unspecified chronic bronchitis: Secondary | ICD-10-CM | POA: Diagnosis not present

## 2014-05-01 DIAGNOSIS — I10 Essential (primary) hypertension: Secondary | ICD-10-CM | POA: Diagnosis not present

## 2014-05-01 DIAGNOSIS — N179 Acute kidney failure, unspecified: Secondary | ICD-10-CM | POA: Diagnosis not present

## 2014-05-01 LAB — COMPREHENSIVE METABOLIC PANEL
ALBUMIN: 2.7 g/dL — AB (ref 3.5–5.2)
ALT: 9 U/L (ref 0–35)
ANION GAP: 6 (ref 5–15)
AST: 10 U/L (ref 0–37)
Alkaline Phosphatase: 82 U/L (ref 39–117)
BUN: 18 mg/dL (ref 6–23)
CO2: 27 mmol/L (ref 19–32)
CREATININE: 0.96 mg/dL (ref 0.50–1.10)
Calcium: 7.7 mg/dL — ABNORMAL LOW (ref 8.4–10.5)
Chloride: 97 mmol/L (ref 96–112)
GFR calc Af Amer: 67 mL/min — ABNORMAL LOW (ref >90)
GFR calc non Af Amer: 58 mL/min — ABNORMAL LOW (ref >90)
Glucose, Bld: 99 mg/dL (ref 70–99)
Potassium: 3.1 mmol/L — ABNORMAL LOW (ref 3.5–5.1)
Sodium: 130 mmol/L — ABNORMAL LOW (ref 135–145)
TOTAL PROTEIN: 5.5 g/dL — AB (ref 6.0–8.3)
Total Bilirubin: 0.8 mg/dL (ref 0.3–1.2)

## 2014-05-01 LAB — CBC WITH DIFFERENTIAL/PLATELET
BASOS ABS: 0 10*3/uL (ref 0.0–0.1)
BASOS PCT: 0 % (ref 0–1)
EOS ABS: 0 10*3/uL (ref 0.0–0.7)
Eosinophils Relative: 1 % (ref 0–5)
HCT: 26 % — ABNORMAL LOW (ref 36.0–46.0)
Hemoglobin: 9 g/dL — ABNORMAL LOW (ref 12.0–15.0)
LYMPHS PCT: 13 % (ref 12–46)
Lymphs Abs: 0.8 10*3/uL (ref 0.7–4.0)
MCH: 30.4 pg (ref 26.0–34.0)
MCHC: 34.6 g/dL (ref 30.0–36.0)
MCV: 87.8 fL (ref 78.0–100.0)
MONOS PCT: 15 % — AB (ref 3–12)
Monocytes Absolute: 1 10*3/uL (ref 0.1–1.0)
NEUTROS ABS: 4.6 10*3/uL (ref 1.7–7.7)
NEUTROS PCT: 71 % (ref 43–77)
PLATELETS: 165 10*3/uL (ref 150–400)
RBC: 2.96 MIL/uL — ABNORMAL LOW (ref 3.87–5.11)
RDW: 13.5 % (ref 11.5–15.5)
WBC: 6.5 10*3/uL (ref 4.0–10.5)

## 2014-05-01 LAB — PROTIME-INR
INR: 1.07 (ref 0.00–1.49)
Prothrombin Time: 14 seconds (ref 11.6–15.2)

## 2014-05-01 MED ORDER — POTASSIUM CHLORIDE CRYS ER 20 MEQ PO TBCR
40.0000 meq | EXTENDED_RELEASE_TABLET | Freq: Four times a day (QID) | ORAL | Status: AC
  Administered 2014-05-01 (×2): 40 meq via ORAL
  Filled 2014-05-01 (×2): qty 2

## 2014-05-01 MED ORDER — MAGIC MOUTHWASH
5.0000 mL | Freq: Three times a day (TID) | ORAL | Status: DC | PRN
  Administered 2014-05-01 – 2014-05-02 (×4): 5 mL via ORAL
  Filled 2014-05-01 (×6): qty 5

## 2014-05-01 MED ORDER — OXYCODONE HCL 5 MG PO TABS
10.0000 mg | ORAL_TABLET | Freq: Four times a day (QID) | ORAL | Status: DC | PRN
  Administered 2014-05-01 – 2014-05-02 (×4): 10 mg via ORAL
  Filled 2014-05-01 (×4): qty 2

## 2014-05-01 MED ORDER — IOHEXOL 300 MG/ML  SOLN
80.0000 mL | Freq: Once | INTRAMUSCULAR | Status: AC | PRN
  Administered 2014-05-01: 80 mL via INTRAVENOUS

## 2014-05-01 NOTE — Progress Notes (Signed)
[facility] PROGRESS NOTE  Allison Vaughan VQQ:[id number] DOB: [birth year] DOA: 04/30/2014 PCP: Antonietta Jewel, MD  Assessment/Plan: 1. Hypotension -Likely secondary to antihypertensive agents and volume depletion, blood pressures improving after the administration of IV fluids. In the outpatient setting she had been on amlodipine 10 mg by mouth daily, atenolol 50 mg by mouth daily, losartan 100 mg by mouth daily and hydrochlorothiazide 25 mg by mouth daily -Patient appears better this morning after receiving IV fluids overnight. Last blood pressure was 130/58. -I think for now will continue holding antihypertensive agents and monitor her blood pressures over the course of the day.  2.  Hypokalemia. -Initial labs showing potassium of 2.7 which likely is due to hydrochlorothiazide therapy. A.m. labs showing some improvement to her potassium level 3.1 after getting potassium replacement overnight. Will administer potassium chloride 40 mEq by mouth 2 doses today. -As mentioned above hydrochlorothiazide has been discontinued.  3.  Hyponatremia. -Initial labs showing sodium of 126, improving to 130 on a.m. lab work after IV fluid resuscitation -Likely secondary to hypotonic hypovolemic hyponatremia, likely precipitated by diuretic use -Continue IV fluid resuscitation with normal saline  4.  Spiculated mass. -Patient having a CT scan on 04/15/2014 that showed a 2.9 cm pigmented nodule in the right lung base raising the concern for malignancy given her history of tobacco abuse. -I discussed case with radiology today, recommending that a dedicated CT scan of lungs with IV contrast be done to further characterize nodule and to see if there has been any change  5.  History of hypertension. -As mentioned above due to the presence of hypotension and dehydration at hypertensive agents and hydrochlorothiazide have been held.  6.  Acute kidney injury. -Lab work on presentation showing a creatinine  of 1.46 that improved with the administration of IV fluids. This morning creatinine trended down to 0.96. -Widely secondary to prerenal azotemia   Code Status: Full code Family Communication: I spoke to her son over telephone Disposition Plan: CT scan with IV contrast the lungs ordered    HPI/Subjective: Patient is a pleasant 69 year old female with a past medical history of tobacco abuse, chronic instructed pulmonary disease, who was recently discharged from our service on 04/20/2014 at which time she was treated for nausea/vomiting felt to be secondary to viral syndrome along with COPD exacerbation. He reports that she has had ongoing generalized weakness, fatigue, poor tolerance to physical exertion since her hospitalization. She was found to be hypotensive having a blood pressure of 96/54, with labs showing development of acute kidney injury with creatinine trending up to 1.46 from 0.67 on 06/18/2014. She was also found to be hypokalemic with potassium of 2.7. Workup did not reveal an obvious source of infection as chest x-ray did not reveal evidence of pneumonia and urinalysis was negative. Hypotension felt to be secondary to antihypertensive agents and hypovolemia that likely resulted from a diuretic therapy. Blood pressures improved after the demonstration of IV fluids.  Objective: Filed Vitals:   05/01/14 0452  BP: 130/58  Pulse: 77  Temp: 98.3 F (36.8 C)  Resp: 18    Intake/Output Summary (Last 24 hours) at 05/01/14 1055 Last data filed at 05/01/14 0900  Gross per 24 hour  Intake   1230 ml  Output   1251 ml  Net    -21 ml   Filed Weights   04/30/14 2238 05/01/14 0452  Weight: 59.693 kg (131 lb 9.6 oz) 59.376 kg (130 lb 14.4 oz)    Exam:   General:  Patient is nontoxic appearing, awake and alert, no acute distress  Cardiovascular: Regular rate and rhythm normal S1-S2 no murmurs rubs or gallops  Respiratory: Normal respiratory effort, lungs are clear to auscultation  bilaterally  Abdomen: Soft nontender nondistended positive bowel sounds in all 4 quadrants  Musculoskeletal: No edema   Data Reviewed: Basic Metabolic Panel:  Recent Labs Lab 04/30/14 1830 04/30/14 2148 05/01/14 0612  NA 126* 132* 130*  K 2.7* 3.0* 3.1*  CL 87* 94* 97  CO2 29 29 27   GLUCOSE 105* 115* 99  BUN 31* 27* 18  CREATININE 1.46* 1.25* 0.96  CALCIUM 8.2* 8.0* 7.7*  MG 1.7  --   --    Liver Function Tests:  Recent Labs Lab 04/30/14 1830 04/30/14 2148 05/01/14 0612  AST 16 15 10   ALT 11 11 9   ALKPHOS 107 95 82  BILITOT 0.8 0.6 0.8  PROT 6.6 6.0 5.5*  ALBUMIN 3.5 3.0* 2.7*   No results for input(s): LIPASE, AMYLASE in the last 168 hours. No results for input(s): AMMONIA in the last 168 hours. CBC:  Recent Labs Lab 04/30/14 1830 05/01/14 0612  WBC 12.0* 6.5  NEUTROABS 9.0* 4.6  HGB 10.8* 9.0*  HCT 30.9* 26.0*  MCV 87.0 87.8  PLT 191 165   Cardiac Enzymes: No results for input(s): CKTOTAL, CKMB, CKMBINDEX, TROPONINI in the last 168 hours. BNP (last 3 results) No results for input(s): BNP in the last 8760 hours.  ProBNP (last 3 results) No results for input(s): PROBNP in the last 8760 hours.  CBG: No results for input(s): GLUCAP in the last 168 hours.  No results found for this or any previous visit (from the past 240 hour(s)).   Studies: Dg Chest 2 View  04/30/2014   CLINICAL DATA:  Hypotension and weakness. Symptoms for 1 month. History of COPD. Lung nodule.  EXAM: CHEST  2 VIEW  COMPARISON:  Chest and rib radiographs 04/17/2014, included lung bases from CT abdomen 04/15/2014  FINDINGS: The nodular opacity at the posterior medial right lung base on prior CT is not well seen radiographically. Cardiomegaly and tortuous thoracic aorta, unchanged. Increased bronchovascular markings, appear chronic and unchanged. No confluent airspace disease. No pulmonary edema, pleural effusion or pneumothorax. The bones are under mineralized. Cortical irregularity  of right anterior lateral fifth rib is again seen.  IMPRESSION: 1.  No acute pulmonary process. 2. The nodular opacity in the posterior medial right lung based on prior CT is not well seen radiographically. Unchanged right anterior lateral right rib fracture.   Electronically Signed   By: Jeb Levering M.D.   On: 04/30/2014 18:58    Scheduled Meds: . budesonide  0.25 mg Nebulization BID  . FLUoxetine  40 mg Oral q morning - 10a  . heparin  5,000 Units Subcutaneous 3 times per day  . ipratropium  0.5 mg Nebulization QID  . nicotine  14 mg Transdermal Daily  . pantoprazole  40 mg Oral Daily  . potassium chloride  40 mEq Oral Q6H  . simvastatin  20 mg Oral QHS  . sodium chloride  3 mL Intravenous Q12H   Continuous Infusions: . sodium chloride 100 mL/hr at 04/30/14 2300    Principal Problem:   AKI (acute kidney injury) Active Problems:   TOBACCO DEPENDENCE   Mood disorder   Chronic hypoxemic respiratory failure   COPD (chronic obstructive pulmonary disease)   Pulmonary nodule   Essential hypertension   Hypokalemia   Hyponatremia   Hypotension   Nausea  Time spent: 40 min    Kelvin Cellar  [facility] Pager [phone]. If 7PM-7AM, please contact night-coverage at [email or web address], password [facility] 05/01/2014, 10:55 AM  LOS: 1 day

## 2014-05-01 NOTE — Care Management Note (Addendum)
    Page 1 of 1   05/02/2014     10:46:07 AM CARE MANAGEMENT NOTE 05/02/2014  Patient:  Allison Vaughan, Allison Vaughan   Account Number:  [id number]  Date Initiated:  05/01/2014  Documentation initiated by:  Dessa Phi  Subjective/Objective Assessment:   69 y/o f admitted w/AKI.HV:FMBBUY.Readmit 4/1-4/6-COPD.     Action/Plan:   From home.   Anticipated DC Date:  05/02/2014   Anticipated DC Plan:  [place]  CM consult      Choice offered to / List presented to:             Status of service:  Completed, signed off Medicare Important Message given?   (If response is "NO", the following Medicare IM given date fields will be blank) Date Medicare IM given:   Medicare IM given by:   Date Additional Medicare IM given:   Additional Medicare IM given by:    Discharge Disposition:  HOME/SELF CARE  Per UR Regulation:  Reviewed for med. necessity/level of care/duration of stay  If discussed at [place] of Stay Meetings, dates discussed:    Comments:  05/02/14 Dessa Phi RN BSN NCM [phone] d/c home no further d/c needs.  05/01/14 Dessa Phi RN BSN NCM [phone] [id number] given.Patient voiced understanding. Referral for med assit-nicotine patch OTC,no script needed.Not a covered benefit in script plan.TC patient's pharmacy-[facility]-[phone] coupons or discounts to offer.Provided patient w/lowest price comparison.No further med asst needs.

## 2014-05-02 DIAGNOSIS — I10 Essential (primary) hypertension: Secondary | ICD-10-CM | POA: Diagnosis not present

## 2014-05-02 DIAGNOSIS — J9611 Chronic respiratory failure with hypoxia: Secondary | ICD-10-CM | POA: Diagnosis not present

## 2014-05-02 DIAGNOSIS — N179 Acute kidney failure, unspecified: Secondary | ICD-10-CM | POA: Diagnosis not present

## 2014-05-02 DIAGNOSIS — E876 Hypokalemia: Secondary | ICD-10-CM | POA: Diagnosis not present

## 2014-05-02 LAB — CBC
HCT: 27.6 % — ABNORMAL LOW (ref 36.0–46.0)
Hemoglobin: 9.3 g/dL — ABNORMAL LOW (ref 12.0–15.0)
MCH: 30.4 pg (ref 26.0–34.0)
MCHC: 33.7 g/dL (ref 30.0–36.0)
MCV: 90.2 fL (ref 78.0–100.0)
PLATELETS: 193 10*3/uL (ref 150–400)
RBC: 3.06 MIL/uL — ABNORMAL LOW (ref 3.87–5.11)
RDW: 13.8 % (ref 11.5–15.5)
WBC: 5.6 10*3/uL (ref 4.0–10.5)

## 2014-05-02 LAB — BASIC METABOLIC PANEL
Anion gap: 6 (ref 5–15)
BUN: 10 mg/dL (ref 6–23)
CO2: 24 mmol/L (ref 19–32)
Calcium: 8 mg/dL — ABNORMAL LOW (ref 8.4–10.5)
Chloride: 99 mmol/L (ref 96–112)
Creatinine, Ser: 0.95 mg/dL (ref 0.50–1.10)
GFR calc non Af Amer: 59 mL/min — ABNORMAL LOW (ref >90)
GFR, EST AFRICAN AMERICAN: 68 mL/min — AB (ref >90)
Glucose, Bld: 77 mg/dL (ref 70–99)
POTASSIUM: 4.1 mmol/L (ref 3.5–5.1)
Sodium: 129 mmol/L — ABNORMAL LOW (ref 135–145)

## 2014-05-02 MED ORDER — AMLODIPINE BESYLATE 10 MG PO TABS
10.0000 mg | ORAL_TABLET | Freq: Every day | ORAL | Status: DC
  Administered 2014-05-02: 10 mg via ORAL
  Filled 2014-05-02: qty 1

## 2014-05-02 MED ORDER — AMLODIPINE BESYLATE 10 MG PO TABS: 10.0000 mg | ORAL_TABLET | Freq: Every day | ORAL | Status: DC

## 2014-05-02 MED ORDER — NICOTINE 14 MG/24HR TD PT24: 14.0000 mg | MEDICATED_PATCH | Freq: Every day | TRANSDERMAL | Status: DC

## 2014-05-02 NOTE — Discharge Summary (Addendum)
Physician Discharge Summary  Allison Vaughan OMV:[id number] DOB: May 31, 1945 DOA: 04/30/2014  PCP: Antonietta Jewel, MD  Admit date: 04/30/2014 Discharge date: 05/02/2014  Time spent: 35 minutes  Recommendations for Outpatient Follow-up:  1. Please follow up on blood pressures, she presented with hypotension likely secondary to multiple antihypertensives. She was discharged on Amlodipine 10 mg OP q daily and instructed to stop Cozaar and Atenolol.  2. Repea CT scan of Chest in 3-4 months to follow up on ill defined opacity to ensure stability   Discharge Diagnoses:  Principal Problem:   AKI (acute kidney injury) Active Problems:   TOBACCO DEPENDENCE   Mood disorder   Chronic hypoxemic respiratory failure   COPD (chronic obstructive pulmonary disease)   Pulmonary nodule   Essential hypertension   Hypokalemia   Hyponatremia   Hypotension   Nausea   Discharge Condition: Stable  Diet recommendation: Heart Healthy  [facility] Weights   04/30/14 2238 05/01/14 0452 05/02/14 0529  Weight: 59.693 kg (131 lb 9.6 oz) 59.376 kg (130 lb 14.4 oz) 62.234 kg (137 lb 3.2 oz)    History of present illness:  Allison Vaughan is a 69 y.o. female with history of COPD and hypertension presents to the ER because of persistent nausea vomiting diarrhea. Patient has been having these symptoms over the last 1 week. Patient had called her PCP and was prescribed antibiotics despite the patient is to have diarrhea. Patient also has been having crampy abdominal pain. Patient states one of her grandson's friend had similar symptoms and probably she got it from him. CT abdomen and pelvis done in the ER was unremarkable. On exam patient's abdomen appears benign. Labs reveal acute renal failure with hypokalemia. And patient also is wheezing bilaterally.   Hospital Course:  Patient is a pleasant 69 year old female with a past medical history of tobacco abuse, chronic instructed pulmonary disease, who was recently  discharged from our service on 04/20/2014 at which time she was treated for nausea/vomiting felt to be secondary to viral syndrome along with COPD exacerbation. He reports that she has had ongoing generalized weakness, fatigue, poor tolerance to physical exertion since her hospitalization. She was found to be hypotensive having a blood pressure of 96/54, with labs showing development of acute kidney injury with creatinine trending up to 1.46 from 0.67 on 06/18/2014. She was also found to be hypokalemic with potassium of 2.7. Workup did not reveal an obvious source of infection as chest x-ray did not reveal evidence of pneumonia and urinalysis was negative. Hypotension felt to be secondary to antihypertensive agents and hypovolemia that likely resulted from a diuretic therapy. Blood pressures improved after the demonstration of IV fluids. Patient having a CT scan on 04/15/2014 that showed a 2.9 cm pigmented nodule in the right lung base raising the concern for malignancy given her history of tobacco abuse. I discussed case with radiology,  recommending that a dedicated CT scan of lungs with IV contrast be done to further characterize nodule and to see if there has been any change. CT of Chest showed a stable ill-defined density most likely secondary to scarring. Radiology recommended follow up CT in 3-4 months to ensure stability. She was discharged home on 05/02/2014 in stable condition. Hypotension resolving having last blood pressure of 147/65. She was discharged on Norvasc 10 mg PO q daily and instructed to stop Cozaar and Atenolol.     Discharge Exam: Filed Vitals:   05/02/14 0741  BP: 147/65  Pulse: 94  Temp: 98.1 F (36.7 C)  Resp: 16     General: Patient is nontoxic appearing, awake and alert, no acute distress  Cardiovascular: Regular rate and rhythm normal S1-S2 no murmurs rubs or gallops  Respiratory: Normal respiratory effort, lungs are clear to auscultation bilaterally  Abdomen: Soft  nontender nondistended positive bowel sounds in all 4 quadrants  Musculoskeletal: No edema  Discharge Instructions   Discharge Instructions    Call MD for:  difficulty breathing, headache or visual disturbances    Complete by:  As directed      Call MD for:  extreme fatigue    Complete by:  As directed      Call MD for:  hives    Complete by:  As directed      Call MD for:  persistant dizziness or light-headedness    Complete by:  As directed      Call MD for:  persistant nausea and vomiting    Complete by:  As directed      Call MD for:  redness, tenderness, or signs of infection (pain, swelling, redness, odor or green/yellow discharge around incision site)    Complete by:  As directed      Call MD for:  severe uncontrolled pain    Complete by:  As directed      Call MD for:  temperature >100.4    Complete by:  As directed      Diet - low sodium heart healthy    Complete by:  As directed      Increase activity slowly    Complete by:  As directed           Current Discharge Medication List    CONTINUE these medications which have CHANGED   Details  amLODipine (NORVASC) 10 MG tablet Take 1 tablet (10 mg total) by mouth daily. Qty: 30 tablet, Refills: 1    nicotine (NICODERM CQ - DOSED IN MG/24 HOURS) 14 mg/24hr patch Place 1 patch (14 mg total) onto the skin daily. Qty: 28 patch, Refills: 0      CONTINUE these medications which have NOT CHANGED   Details  albuterol (PROVENTIL HFA;VENTOLIN HFA) 108 (90 BASE) MCG/ACT inhaler Inhale 2 puffs into the lungs every 6 (six) hours as needed for wheezing. Qty: 1 Inhaler, Refills: 2    albuterol (PROVENTIL) (2.5 MG/3ML) 0.083% nebulizer solution Take 3 mLs (2.5 mg total) by nebulization every 2 (two) hours as needed for wheezing. Qty: 75 mL, Refills: 12    ALPRAZolam (XANAX) 1 MG tablet Take 1 mg by mouth 3 (three) times daily as needed for anxiety or sleep. For anxiety    budesonide (PULMICORT) 0.25 MG/2ML nebulizer solution  Take 2 mLs (0.25 mg total) by nebulization 2 (two) times daily. Qty: 60 mL, Refills: 12    FLUoxetine (PROZAC) 40 MG capsule Take 40 mg by mouth every morning.     guaiFENesin (MUCINEX) 600 MG 12 hr tablet Take 1,200 mg by mouth 2 (two) times daily as needed for congestion.    ipratropium (ATROVENT) 0.02 % nebulizer solution Take 2.5 mLs (0.5 mg total) by nebulization 4 (four) times daily. Qty: 75 mL, Refills: 12    naproxen (NAPROSYN) 500 MG tablet Take 1 tablet by mouth 2 (two) times daily as needed for mild pain or moderate pain.     omeprazole (PRILOSEC) 40 MG capsule Take 1 capsule (40 mg total) by mouth daily. Qty: 30 capsule, Refills: 6    ondansetron (ZOFRAN ODT) 4 MG disintegrating tablet Take 1 tablet (  4 mg total) by mouth every 8 (eight) hours as needed for nausea or vomiting. Qty: 20 tablet, Refills: 0    Oxycodone HCl 10 MG TABS Take 10 mg by mouth 3 (three) times daily as needed. For pain    simvastatin (ZOCOR) 20 MG tablet Take 1 tablet by mouth at bedtime.  Refills: 11      STOP taking these medications     atenolol (TENORMIN) 50 MG tablet      hydrALAZINE (APRESOLINE) 100 MG tablet      losartan (COZAAR) 100 MG tablet      levofloxacin (LEVAQUIN) 500 MG tablet      predniSONE (DELTASONE) 10 MG tablet        No Known Allergies Follow-up Information    Follow [facility] with [facility], [place] In 1 week.   Specialty:  Internal Medicine   Contact information:   [address] [place] [address] [phone]        The results of significant diagnostics from this hospitalization (including imaging, microbiology, ancillary and laboratory) are listed below for reference.    Significant Diagnostic Studies: Ct Abdomen Pelvis Wo Contrast  04/16/2014   CLINICAL DATA:  Acute onset of nausea, vomiting and diarrhea. Hypotension. Initial encounter.  EXAM: CT ABDOMEN AND PELVIS WITHOUT CONTRAST  TECHNIQUE: Multidetector CT imaging of the abdomen and pelvis was  performed following the standard protocol without IV contrast.  COMPARISON:  MRI of the abdomen performed 04/08/2004  FINDINGS: There appears to be a 2.9 cm spiculated nodule at the right lung base. This raises concern for malignancy. Infection is thought to be less likely. Scattered coronary artery calcification is seen.  The liver and spleen are unremarkable in appearance. The gallbladder is within normal limits. The pancreas and adrenal glands are unremarkable.  The kidneys are unremarkable in appearance. There is no evidence of hydronephrosis. No renal or ureteral stones are seen. No perinephric stranding is appreciated.  No free fluid is identified. The small bowel is unremarkable in appearance. The stomach is within normal limits. No acute vascular abnormalities are seen. Diffuse calcification is noted along the abdominal aorta and its branches.  The appendix is not definitely seen; there is no evidence of appendicitis. The colon is unremarkable in appearance.  The bladder is relatively decompressed and grossly unremarkable, though not well assessed. The patient is status post hysterectomy. The ovaries are grossly symmetric. No suspicious adnexal masses are seen. No inguinal lymphadenopathy is seen.  No acute osseous abnormalities are identified. Multilevel vacuum phenomenon and disc space narrowing are noted along the lumbar spine, with minimal grade 1 retrolisthesis of L1 on L2 and of L2 on L3.  IMPRESSION: 1. No acute abnormality seen to explain the patient's symptoms. 2. 2.9 cm spiculated nodule noted at the right lung base. This raises concern for malignancy. Infection is thought to be less likely, given apparent spiculation. PET/CT or biopsy would be helpful for further evaluation, as deemed clinically appropriate. 3. Scattered coronary artery calcifications seen. 4. Diffuse calcification along the abdominal aorta and its branches. 5. Mild diffuse degenerative change along the lumbar spine.    Electronically Signed   By: Garald Balding M.D.   On: 04/16/2014 00:40   Dg Chest 2 View  04/30/2014   CLINICAL DATA:  Hypotension and weakness. Symptoms for 1 month. History of COPD. Lung nodule.  EXAM: CHEST  2 VIEW  COMPARISON:  Chest and rib radiographs 04/17/2014, included lung bases from CT abdomen 04/15/2014  FINDINGS: The nodular  opacity at the posterior medial right lung base on prior CT is not well seen radiographically. Cardiomegaly and tortuous thoracic aorta, unchanged. Increased bronchovascular markings, appear chronic and unchanged. No confluent airspace disease. No pulmonary edema, pleural effusion or pneumothorax. The bones are under mineralized. Cortical irregularity of right anterior lateral fifth rib is again seen.  IMPRESSION: 1.  No acute pulmonary process. 2. The nodular opacity in the posterior medial right lung based on prior CT is not well seen radiographically. Unchanged right anterior lateral right rib fracture.   Electronically Signed   By: Jeb Levering M.D.   On: 04/30/2014 18:58   Dg Chest 2 View  04/15/2014   CLINICAL DATA:  Cough.  Congestion.  Shortness of breath.  EXAM: CHEST  2 VIEW  COMPARISON:  07/04/2012  FINDINGS: Mild hyperinflation. Lateral view degraded by patient arm position. Midline trachea. Mild cardiomegaly with a tortuous thoracic aorta. No pleural effusion or pneumothorax. Mild interstitial thickening. No lobar consolidation. S-shaped thoracolumbar spine curvature.  IMPRESSION: Cardiomegaly and mild COPD/chronic bronchitis. No acute superimposed process.   Electronically Signed   By: Abigail Miyamoto M.D.   On: 04/15/2014 22:00   Dg Ribs Unilateral W/chest Right  04/17/2014   CLINICAL DATA:  Patient with central and posterior right rib pain status post fall today. Cough and congestion.  EXAM: RIGHT RIBS AND CHEST - 3+ VIEW  COMPARISON:  Chest radiograph 04/15/2014  FINDINGS: Stable enlarged cardiac and mediastinal contours. No consolidative pulmonary  opacities. No pleural effusion or pneumothorax. Mild displacement of the right lateral fifth rib, potentially chronic.  IMPRESSION: Possible nondisplaced right lateral fifth rib fracture, potentially chronic. Recommend correlation for point tenderness.  Cardiomegaly.   Electronically Signed   By: Lovey Newcomer M.D.   On: 04/17/2014 19:12   Dg Elbow Complete Right  04/17/2014   CLINICAL DATA:  Patient with pain to the olecranon status post fall.  EXAM: RIGHT ELBOW - COMPLETE 3+ VIEW  COMPARISON:  None.  FINDINGS: Normal anatomic alignment. No evidence for acute fracture or dislocation. Regional soft tissues unremarkable.  IMPRESSION: No acute osseous abnormality.   Electronically Signed   By: Lovey Newcomer M.D.   On: 04/17/2014 19:08   Ct Chest W Contrast  05/01/2014   CLINICAL DATA:  Followup right lower lobe lung lesions seen on recent abdominal CT scan.  EXAM: CT CHEST WITH CONTRAST  TECHNIQUE: Multidetector CT imaging of the chest was performed during intravenous contrast administration.  CONTRAST:  60mL OMNIPAQUE IOHEXOL 300 MG/ML  SOLN  COMPARISON:  Abdominal CT scan 04/15/2014  FINDINGS: Chest wall: No breast masses, supraclavicular or axillary lymphadenopathy. The bony thorax is intact. No worrisome bone lesions or spinal canal compromise. A few small scattered sclerotic lesions are noted in the thoracic spine and sternum.  Mediastinum: The heart is normal in size. No pericardial effusion. No mediastinal or hilar mass or adenopathy. Moderate to advanced atherosclerotic calcifications involving the aorta but no focal aneurysm or dissection. Dense coronary artery calcifications are noted. The esophagus is grossly normal.  Lungs/pleura: Patchy tree-in-bud appearance in the right upper lobe likely due to inflammation or atypical infection. A small calcified granuloma is noted in the left lower lobe on image number 35. There is a stable somewhat linear density in the medial aspect of the right lower lobe. This is  likely focal scarring changes. A mass is unlikely but not excluded. No other significant pulmonary findings. No pleural effusion.  The upper abdomen is unremarkable.  IMPRESSION: 1. No mediastinal  or hilar mass or adenopathy. Stable ill-defined somewhat linear density in the right lower lobe. This is most likely scarring change. Recommend short-term followup noncontrast chest CT in 3-4 months to reassess. 2. Tree-in-bud pattern in the right upper lobe likely due to inflammation or atypical infection such as MAC. 3. Advanced atherosclerotic calcifications involving the aorta and coronary arteries.   Electronically Signed   By: Marijo Sanes M.D.   On: 05/01/2014 14:45    Microbiology: No results found for this or any previous visit (from the past 240 hour(s)).   Labs: Basic Metabolic Panel:  Recent Labs Lab 04/30/14 1830 04/30/14 2148 05/01/14 0612 05/02/14 0601  NA 126* 132* 130* 129*  K 2.7* 3.0* 3.1* 4.1  CL 87* 94* 97 99  CO2 29 29 27 24   GLUCOSE 105* 115* 99 77  BUN 31* 27* 18 10  CREATININE 1.46* 1.25* 0.96 0.95  CALCIUM 8.2* 8.0* 7.7* 8.0*  MG 1.7  --   --   --    Liver Function Tests:  Recent Labs Lab 04/30/14 1830 04/30/14 2148 05/01/14 0612  AST 16 15 10   ALT 11 11 9   ALKPHOS 107 95 82  BILITOT 0.8 0.6 0.8  PROT 6.6 6.0 5.5*  ALBUMIN 3.5 3.0* 2.7*   No results for input(s): LIPASE, AMYLASE in the last 168 hours. No results for input(s): AMMONIA in the last 168 hours. CBC:  Recent Labs Lab 04/30/14 1830 05/01/14 0612 05/02/14 0601  WBC 12.0* 6.5 5.6  NEUTROABS 9.0* 4.6  --   HGB 10.8* 9.0* 9.3*  HCT 30.9* 26.0* 27.6*  MCV 87.0 87.8 90.2  PLT 191 165 193   Cardiac Enzymes: No results for input(s): CKTOTAL, CKMB, CKMBINDEX, TROPONINI in the last 168 hours. BNP: BNP (last 3 results) No results for input(s): BNP in the last 8760 hours.  ProBNP (last 3 results) No results for input(s): PROBNP in the last 8760 hours.  CBG: No results for input(s):  GLUCAP in the last 168 hours.     SignedKelvin Cellar  [facility] 05/02/2014, 10:16 AM

## 2014-05-02 NOTE — Progress Notes (Signed)
UR completed 

## 2014-08-02 ENCOUNTER — Other Ambulatory Visit: Payer: Self-pay | Admitting: Internal Medicine

## 2015-04-12 ENCOUNTER — Other Ambulatory Visit: Payer: Self-pay

## 2015-04-12 DIAGNOSIS — Z1231 Encounter for screening mammogram for malignant neoplasm of breast: Secondary | ICD-10-CM

## 2015-04-20 ENCOUNTER — Ambulatory Visit: Payer: Medicare Other

## 2015-04-27 ENCOUNTER — Ambulatory Visit: Payer: Medicare Other

## 2015-05-15 ENCOUNTER — Ambulatory Visit: Payer: Medicare Other

## 2015-05-26 ENCOUNTER — Ambulatory Visit: Payer: Medicare Other

## 2015-07-12 ENCOUNTER — Encounter (HOSPITAL_COMMUNITY): Payer: Self-pay | Admitting: Emergency Medicine

## 2015-07-12 ENCOUNTER — Emergency Department (HOSPITAL_COMMUNITY)
Admission: EM | Admit: 2015-07-12 | Discharge: 2015-07-12 | Disposition: A | Discharge status: Home / Self Care | Payer: Medicare Other | Attending: Emergency Medicine | Admitting: Emergency Medicine

## 2015-07-12 ENCOUNTER — Emergency Department (HOSPITAL_COMMUNITY): Payer: Medicare Other

## 2015-07-12 DIAGNOSIS — E7800 Pure hypercholesterolemia, unspecified: Secondary | ICD-10-CM | POA: Diagnosis not present

## 2015-07-12 DIAGNOSIS — R404 Transient alteration of awareness: Secondary | ICD-10-CM | POA: Diagnosis not present

## 2015-07-12 DIAGNOSIS — F17210 Nicotine dependence, cigarettes, uncomplicated: Secondary | ICD-10-CM | POA: Insufficient documentation

## 2015-07-12 DIAGNOSIS — E876 Hypokalemia: Secondary | ICD-10-CM | POA: Insufficient documentation

## 2015-07-12 DIAGNOSIS — R5383 Other fatigue: Secondary | ICD-10-CM | POA: Diagnosis present

## 2015-07-12 DIAGNOSIS — J449 Chronic obstructive pulmonary disease, unspecified: Secondary | ICD-10-CM | POA: Diagnosis not present

## 2015-07-12 DIAGNOSIS — I10 Essential (primary) hypertension: Secondary | ICD-10-CM | POA: Diagnosis not present

## 2015-07-12 DIAGNOSIS — Z79899 Other long term (current) drug therapy: Secondary | ICD-10-CM | POA: Insufficient documentation

## 2015-07-12 LAB — MAGNESIUM: Magnesium: 2 mg/dL (ref 1.7–2.4)

## 2015-07-12 LAB — RAPID URINE DRUG SCREEN, HOSP PERFORMED
AMPHETAMINES: NOT DETECTED
Barbiturates: NOT DETECTED
Benzodiazepines: POSITIVE — AB
Cocaine: NOT DETECTED
Opiates: NOT DETECTED
TETRAHYDROCANNABINOL: NOT DETECTED

## 2015-07-12 LAB — COMPREHENSIVE METABOLIC PANEL
ALT: 13 U/L — AB (ref 14–54)
AST: 18 U/L (ref 15–41)
Albumin: 3.3 g/dL — ABNORMAL LOW (ref 3.5–5.0)
Alkaline Phosphatase: 118 U/L (ref 38–126)
Anion gap: 10 (ref 5–15)
BUN: 9 mg/dL (ref 6–20)
CHLORIDE: 98 mmol/L — AB (ref 101–111)
CO2: 27 mmol/L (ref 22–32)
CREATININE: 1.32 mg/dL — AB (ref 0.44–1.00)
Calcium: 8.6 mg/dL — ABNORMAL LOW (ref 8.9–10.3)
GFR calc Af Amer: 45 mL/min — ABNORMAL LOW (ref >60)
GFR calc non Af Amer: 39 mL/min — ABNORMAL LOW (ref >60)
Glucose, Bld: 121 mg/dL — ABNORMAL HIGH (ref 65–99)
Potassium: 2.5 mmol/L — CL (ref 3.5–5.1)
Sodium: 135 mmol/L (ref 135–145)
Total Bilirubin: 0.5 mg/dL (ref 0.3–1.2)
Total Protein: 6.2 g/dL — ABNORMAL LOW (ref 6.5–8.1)

## 2015-07-12 LAB — CBC WITH DIFFERENTIAL/PLATELET
BASOS ABS: 0.1 10*3/uL (ref 0.0–0.1)
Basophils Relative: 1 %
EOS ABS: 0.4 10*3/uL (ref 0.0–0.7)
EOS PCT: 7 %
HCT: 35.5 % — ABNORMAL LOW (ref 36.0–46.0)
HEMOGLOBIN: 12.3 g/dL (ref 12.0–15.0)
LYMPHS PCT: 22 %
Lymphs Abs: 1.5 10*3/uL (ref 0.7–4.0)
MCH: 28.8 pg (ref 26.0–34.0)
MCHC: 34.6 g/dL (ref 30.0–36.0)
MCV: 83.1 fL (ref 78.0–100.0)
Monocytes Absolute: 0.8 10*3/uL (ref 0.1–1.0)
Monocytes Relative: 12 %
Neutro Abs: 3.9 10*3/uL (ref 1.7–7.7)
Neutrophils Relative %: 58 %
PLATELETS: 219 10*3/uL (ref 150–400)
RBC: 4.27 MIL/uL (ref 3.87–5.11)
RDW: 12.3 % (ref 11.5–15.5)
WBC: 6.8 10*3/uL (ref 4.0–10.5)

## 2015-07-12 LAB — URINALYSIS, ROUTINE W REFLEX MICROSCOPIC
Bilirubin Urine: NEGATIVE
GLUCOSE, UA: NEGATIVE mg/dL
HGB URINE DIPSTICK: NEGATIVE
Ketones, ur: NEGATIVE mg/dL
Leukocytes, UA: NEGATIVE
Nitrite: NEGATIVE
Protein, ur: NEGATIVE mg/dL
SPECIFIC GRAVITY, URINE: 1.008 (ref 1.005–1.030)
pH: 6.5 (ref 5.0–8.0)

## 2015-07-12 LAB — I-STAT TROPONIN, ED: TROPONIN I, POC: 0.01 ng/mL (ref 0.00–0.08)

## 2015-07-12 LAB — I-STAT CG4 LACTIC ACID, ED: LACTIC ACID, VENOUS: 1.32 mmol/L (ref 0.5–1.9)

## 2015-07-12 MED ORDER — POTASSIUM CHLORIDE 10 MEQ/100ML IV SOLN
10.0000 meq | Freq: Once | INTRAVENOUS | Status: AC
  Administered 2015-07-12: 10 meq via INTRAVENOUS
  Filled 2015-07-12: qty 100

## 2015-07-12 MED ORDER — SODIUM CHLORIDE 0.9 % IV BOLUS (SEPSIS)
1000.0000 mL | Freq: Once | INTRAVENOUS | Status: AC
  Administered 2015-07-12: 1000 mL via INTRAVENOUS

## 2015-07-12 MED ORDER — POTASSIUM CHLORIDE CRYS ER 20 MEQ PO TBCR: 20.0000 meq | EXTENDED_RELEASE_TABLET | Freq: Every day | ORAL | Status: DC

## 2015-07-12 MED ORDER — SODIUM CHLORIDE 0.9 % IV BOLUS (SEPSIS)
500.0000 mL | Freq: Once | INTRAVENOUS | Status: AC
  Administered 2015-07-12: 500 mL via INTRAVENOUS

## 2015-07-12 MED ORDER — NALOXONE HCL 2 MG/2ML IJ SOSY
PREFILLED_SYRINGE | INTRAMUSCULAR | Status: AC
  Administered 2015-07-12: 2 mg
  Filled 2015-07-12: qty 2

## 2015-07-12 MED ORDER — IPRATROPIUM BROMIDE 0.02 % IN SOLN
0.5000 mg | Freq: Once | RESPIRATORY_TRACT | Status: AC
  Administered 2015-07-12: 0.5 mg via RESPIRATORY_TRACT
  Filled 2015-07-12: qty 2.5

## 2015-07-12 MED ORDER — ALBUTEROL SULFATE (2.5 MG/3ML) 0.083% IN NEBU
5.0000 mg | INHALATION_SOLUTION | Freq: Once | RESPIRATORY_TRACT | Status: AC
  Administered 2015-07-12: 5 mg via RESPIRATORY_TRACT
  Filled 2015-07-12: qty 6

## 2015-07-12 MED ORDER — POTASSIUM CHLORIDE CRYS ER 20 MEQ PO TBCR
40.0000 meq | EXTENDED_RELEASE_TABLET | Freq: Once | ORAL | Status: AC
  Administered 2015-07-12: 40 meq via ORAL
  Filled 2015-07-12: qty 2

## 2015-07-12 NOTE — ED Notes (Signed)
Pt's daughter extremely upset her mother received Narcan. States she has been on this medication for many years and she cannot just go right off of it. She states "She is worse than before! We are not giving her any more of that medication!" RN explained protocol orders, narcan's use and efficacy, and the need for the medication, however she remains upset that she received this medication and states she does not want her mom to have any more.

## 2015-07-12 NOTE — ED Notes (Signed)
Ambulated pt to rest room from room. Pt's O2 started at 96%, while ambulating pt's O2 remained around 95%-97%. While ambulating pt also complained of her left foot hurting from her fall. Pt back in bed and on monitor O2 reads 99%

## 2015-07-12 NOTE — ED Notes (Signed)
Daughter came into room-- states "I heard the ambulance gave her Narcan. I don't think they should have done that without asking me. That is for heroin overdoses and I know my mom would not take too much medicine!"

## 2015-07-12 NOTE — ED Notes (Signed)
Pt became more alert/ speech clear after Narcan 2mg  IV

## 2015-07-12 NOTE — ED Provider Notes (Signed)
CSN: [id number]     Arrival date & time 07/12/15  [id number] History   First MD Initiated Contact with Patient 07/12/15 0930     No chief complaint on file.    (Consider location/radiation/quality/duration/timing/severity/associated sxs/prior Treatment) HPI   70 year old female with history of depression, tobacco dependence, COPD, chronic pain brought here via EMS from home for evaluation of lethargy. Per EMS note, EMS was was called to house with family member complaining that patient has been lethargic for the past 3 days. Remember notify the patient is on senna 10 oxycodone and usually hides her medication so therefore they do not know if she has been taking her medications more than usual. She was initially altered but after receiving 1mg  of Narcan on route pt became more alert and oriented.  However, once arrived pt became more confused, lethargic and unsteady on her feet.  An additional 2mg  of Narcan was given in the ER and pt became more alert.  Patient states she takes her usual 10 mg oxycodone this morning in which she takes 3 times daily. She denies taking more medication than usual. She denies homicidal suicidal ideation. She does not know why she is in the knee. She takes pain medication for her chronic back pain. She also report having increase cough and shortness of breath for the past several days along with wheezing. She has chronic abdominal pain and is not new. No complete of fever, URI symptoms, headache, dysuria. No focal numbness or rash.   Past Medical History  Diagnosis Date  . Chronic pain   . Anxiety   . COPD (chronic obstructive pulmonary disease)   . CAP (community acquired pneumonia) 04/18/2014  . Lung nodule 04/16/2014  . OSTEOARTHRITIS, MULTI SITES 03/13/2006    Qualifier: Diagnosis of  By: Damita Dunnings MD, Leota Sauers SYNDROME 03/13/2006    Qualifier: Diagnosis of  By: Damita Dunnings MD, Phillip Heal    . Irritable bowel syndrome 03/13/2006    Qualifier: Diagnosis of  By: Damita Dunnings MD,  [place], NOS 03/13/2006    Qualifier: Diagnosis of  By: Damita Dunnings MD, Isac Caddy DEPENDENCE 03/13/2006    Qualifier: Diagnosis of  By: Damita Dunnings MD, Phillip Heal    . Chronic hypoxemic respiratory failure 06/26/2012  . GASTROESOPHAGEAL REFLUX, NO ESOPHAGITIS 03/13/2006    Qualifier: Diagnosis of  By: Damita Dunnings MD, Phillip Heal    . HYPERTENSION, BENIGN SYSTEMIC 03/13/2006    Qualifier: Diagnosis of  By: Damita Dunnings MD, Phillip Heal    . HYPERCHOLESTEROLEMIA 03/13/2006    Qualifier: Diagnosis of  By: Damita Dunnings MD, Phillip Heal     Past Surgical History  Procedure Laterality Date  . Eye surgery     No family history on file. Social History  Substance Use Topics  . Smoking status: Current Every Day Smoker -- 1.00 packs/day    Types: Cigarettes  . Smokeless tobacco: Not on file  . Alcohol Use: 1.2 oz/week    2 Cans of beer per week     Comment: per week   OB History    No data available     Review of Systems  All other systems reviewed and are negative.     Allergies  Review of patient's allergies indicates no known allergies.  Home Medications   Prior to Admission medications   Medication Sig Start Date End Date Taking? Authorizing Provider  albuterol (PROVENTIL HFA;VENTOLIN HFA) 108 (90 BASE) MCG/ACT inhaler Inhale 2 puffs into the lungs every 6 (six)  hours as needed for wheezing. 06/09/12   Monika Salk, MD  albuterol (PROVENTIL) (2.5 MG/3ML) 0.083% nebulizer solution Take 3 mLs (2.5 mg total) by nebulization every 2 (two) hours as needed for wheezing. 04/20/14   Venetia Maxon Rama, MD  ALPRAZolam Duanne Moron) 1 MG tablet Take 1 mg by mouth 3 (three) times daily as needed for anxiety or sleep. For anxiety    Historical Provider, MD  amLODipine (NORVASC) 10 MG tablet Take 1 tablet (10 mg total) by mouth daily. 05/02/14   Kelvin Cellar, MD  budesonide (PULMICORT) 0.25 MG/2ML nebulizer solution Take 2 mLs (0.25 mg total) by nebulization 2 (two) times daily. 04/20/14   Venetia Maxon Rama, MD   FLUoxetine (PROZAC) 40 MG capsule Take 40 mg by mouth every morning.     Historical Provider, MD  guaiFENesin (MUCINEX) 600 MG 12 hr tablet Take 1,200 mg by mouth 2 (two) times daily as needed for congestion.    Historical Provider, MD  ipratropium (ATROVENT) 0.02 % nebulizer solution Take 2.5 mLs (0.5 mg total) by nebulization 4 (four) times daily. 04/20/14   Venetia Maxon Rama, MD  naproxen (NAPROSYN) 500 MG tablet Take 1 tablet by mouth 2 (two) times daily as needed for mild pain or moderate pain.  04/26/14   Historical Provider, MD  nicotine (NICODERM CQ - DOSED IN MG/24 HOURS) 14 mg/24hr patch Place 1 patch (14 mg total) onto the skin daily. 05/02/14   Kelvin Cellar, MD  omeprazole (PRILOSEC) 40 MG capsule Take 1 capsule (40 mg total) by mouth daily. 04/20/14   Venetia Maxon Rama, MD  ondansetron (ZOFRAN ODT) 4 MG disintegrating tablet Take 1 tablet (4 mg total) by mouth every 8 (eight) hours as needed for nausea or vomiting. 04/20/14   Venetia Maxon Rama, MD  Oxycodone HCl 10 MG TABS Take 10 mg by mouth 3 (three) times daily as needed. For pain    Historical Provider, MD  simvastatin (ZOCOR) 20 MG tablet Take 1 tablet by mouth at bedtime.  02/16/14   Historical Provider, MD   There were no vitals taken for this visit. Physical Exam  Constitutional: She appears well-developed and well-nourished. No distress.  HENT:  Head: Atraumatic.  Tongue is dry  Eyes: Conjunctivae are normal.  Pupils 2 mm bilaterally, equal and reactive.  Neck: Normal range of motion. Neck supple.  No nuchal rigidity. No JVD.  Cardiovascular: Normal rate and regular rhythm.   Pulmonary/Chest:  Decreased breath sounds with poor effort, expiratory wheezes heard, faint rhonchi.  Abdominal: Soft. Bowel sounds are normal. There is no tenderness.  Musculoskeletal:  Able to move all 4 extremities. No focal numbness but  does exhibit global weakness.  Neurological: She is alert. No cranial nerve deficit or sensory deficit. GCS eye  subscore is 4. GCS verbal subscore is 5. GCS motor subscore is 6.  When drowsy, patient was beginning garbled speech. After receiving narcan, patient became alert, oriented and able to tell month and year.  Skin: No rash noted.  Psychiatric: She has a normal mood and affect.  Nursing note and vitals reviewed.   ED Course  Procedures (including critical care time) Labs Review Labs Reviewed  CBC WITH DIFFERENTIAL/PLATELET - Abnormal; Notable for the following:    HCT 35.5 (*)    All other components within normal limits  COMPREHENSIVE METABOLIC PANEL - Abnormal; Notable for the following:    Potassium 2.5 (*)    Chloride 98 (*)    Glucose, Bld 121 (*)  Creatinine, Ser 1.32 (*)    Calcium 8.6 (*)    Total Protein 6.2 (*)    Albumin 3.3 (*)    ALT 13 (*)    GFR calc non Af Amer 39 (*)    GFR calc Af Amer 45 (*)    All other components within normal limits  URINE RAPID DRUG SCREEN, HOSP PERFORMED - Abnormal; Notable for the following:    Benzodiazepines POSITIVE (*)    All other components within normal limits  URINALYSIS, ROUTINE W REFLEX MICROSCOPIC (NOT AT [facility])  MAGNESIUM  I-STAT CG4 LACTIC ACID, ED  Randolm Idol, ED    Imaging Review Dg Chest 2 View  07/12/2015  CLINICAL DATA:  Altered mental status, COPD, smoker EXAM: CHEST  2 VIEW COMPARISON:  04/30/2014 FINDINGS: Enlargement of cardiac silhouette with pulmonary vascular congestion. Atherosclerotic calcification aorta. Lungs clear. No definite infiltrate, pleural effusion or pneumothorax. No pulmonary nodule identified. Bones diffusely demineralized. IMPRESSION: Enlargement of cardiac silhouette with pulmonary vascular congestion. Aortic atherosclerosis. No acute abnormalities. Electronically Signed   By: Lavonia Dana M.D.   On: 07/12/2015 10:48   Ct Head Wo Contrast  07/12/2015  CLINICAL DATA:  Lethargy for 3 days, confusion, unsteady gait. Garbled speech. EXAM: CT HEAD WITHOUT CONTRAST TECHNIQUE: Contiguous axial  images were obtained from the base of the skull through the vertex without intravenous contrast. COMPARISON:  MRI 06/26/2012.  CT 06/25/2012. FINDINGS: Chronic microvascular disease throughout the deep white matter. No acute intracranial abnormality. Specifically, no hemorrhage, hydrocephalus, mass lesion, acute infarction, or significant intracranial injury. No acute calvarial abnormality. Near complete opacification in the right maxillary sinus with mucosal thickening. No air-fluid levels. Mastoid air cells are clear. IMPRESSION: Chronic microvascular disease.  No acute intracranial abnormality. Chronic right maxillary sinusitis. Electronically Signed   By: Rolm Baptise M.D.   On: 07/12/2015 11:34   I have personally reviewed and evaluated these images and lab results as part of my medical decision-making.   EKG Interpretation   Date/Time:  Wednesday July 12 2015 09:41:27 EDT Ventricular Rate:  82 PR Interval:    QRS Duration: 55 QT Interval:  487 QTC Calculation: 569 R Axis:   98 Text Interpretation:  Sinus rhythm Multiple premature complexes, vent &  supraven Right axis deviation Borderline low voltage, extremity leads  Nonspecific T abnormalities, lateral leads Prolonged QT interval No  significant change since last tracing Confirmed by [facility] MD, Corene Cornea  [phone]) on 07/12/2015 9:51:45 AM      MDM   Final diagnoses:  Transient alteration of awareness  Hypokalemia    BP 126/62 mmHg  Pulse 85  Temp(Src) 98.5 F (36.9 C) (Oral)  Resp 18  Ht 5\' 2"  (1.575 m)  Wt 58.968 kg  BMI 23.77 kg/m2  SpO2 97%   10:33 AM Patient brought here from home. EMS for lethargy. Patient appears to be altered but her mental status improved after receiving Narcan. Suspect opiate overdose, unintentional. Patient denies suicidal ideation or homicidal ideation. She also has a cough, shortness of breath and wheezing, she also initially has a O2 sats of 92 on room air when arrived but shortly improves once  presents after she received Narcan. Workup initiated, will monitor closely.  1:41 PM Patient is alert, oriented, in no acute discomfort. EKG without acute ischemic changes, normal troponin, normal lactic acid, UA without signs of urinary tract infection, labs are otherwise reassuring aside from hypokalemia with potassium of 2.5, no EKG changes. Patient has had low potassium in the past. She  is currently taking hydrochlorothiazide which may contribute to her hypokalemia. Patient states she takes potassium supplementation at home. Also evidence of renal insufficiency with a creatinine of 1.32. Patient received  IV fluid here and now able to tolerates by mouth. her head CT and chest x-ray without acute concerning changes. Her UDS shows no evidence of opiate. However, since pt is taking Oxycodone, it may not show up on our UDS screen.  Normal CBG.   3:04 PM Patient is back to her normal baseline. She ambulate without difficulty. Lungs exam improves. She is mentating appropriately. She has received potassium subluxation here. I discussed with [place], we felt pt is stable for discharge.  Suspect her ALOC likely 2/2 to overuse of her pain medication.  i encourage pt to f/u with PCP for further management but strict return precaution if sxs worsen.    Domenic Moras, PA-C 07/12/15 1507  Merrily Pew, MD 07/13/15 [phone]

## 2015-07-12 NOTE — ED Notes (Signed)
Pt to ED via [facility] from home-- EMS called to house with family c/o lethargy x 3 days-- pt is on oxycodone, xanax-- EMS gave Narcan 1mg  enroute- pt became alert/oriented. On pt's arrival to ED rm D34-- pt attempted to use bedside commode, unsteady on feet. Wheezes audible, pt became lethargic, garbled speech. Confused to date/time/place/event. EDP and PA notified.

## 2015-07-12 NOTE — Discharge Instructions (Signed)
You have been evaluated for your confusion.  Please follow up closely with your doctor for further care.  Make sure to take your medication as prescribed.  Your potassium level is low.  Take supplementation and have your potassium recheck by your doctor.  Return to the ER if you have any concerns.  Hypokalemia Hypokalemia means that the amount of potassium in the blood is lower than normal.Potassium is a chemical, called an electrolyte, that helps regulate the amount of fluid in the body. It also stimulates muscle contraction and helps nerves function properly.Most of the body's potassium is inside of cells, and only a very small amount is in the blood. Because the amount in the blood is so small, minor changes can be life-threatening. CAUSES  Antibiotics.  Diarrhea or vomiting.  Using laxatives too much, which can cause diarrhea.  Chronic kidney disease.  Water pills (diuretics).  Eating disorders (bulimia).  Low magnesium level.  Sweating a lot. SIGNS AND SYMPTOMS  Weakness.  Constipation.  Fatigue.  Muscle cramps.  Mental confusion.  Skipped heartbeats or irregular heartbeat (palpitations).  Tingling or numbness. DIAGNOSIS  Your health care provider can diagnose hypokalemia with blood tests. In addition to checking your potassium level, your health care provider may also check other lab tests. TREATMENT Hypokalemia can be treated with potassium supplements taken by mouth or adjustments in your current medicines. If your potassium level is very low, you may need to get potassium through a vein (IV) and be monitored in the hospital. A diet high in potassium is also helpful. Foods high in potassium are:  Nuts, such as peanuts and pistachios.  Seeds, such as sunflower seeds and pumpkin seeds.  Peas, lentils, and lima beans.  Whole grain and bran cereals and breads.  Fresh fruit and vegetables, such as apricots, avocado, bananas, cantaloupe, kiwi, oranges, tomatoes,  asparagus, and potatoes.  Orange and tomato juices.  Red meats.  Fruit yogurt. HOME CARE INSTRUCTIONS  Take all medicines as prescribed by your health care provider.  Maintain a healthy diet by including nutritious food, such as fruits, vegetables, nuts, whole grains, and lean meats.  If you are taking a laxative, be sure to follow the directions on the label. SEEK MEDICAL CARE IF:  Your weakness gets worse.  You feel your heart pounding or racing.  You are vomiting or having diarrhea.  You are diabetic and having trouble keeping your blood glucose in the normal range. SEEK IMMEDIATE MEDICAL CARE IF:  You have chest pain, shortness of breath, or dizziness.  You are vomiting or having diarrhea for more than 2 days.  You faint. MAKE SURE YOU:   Understand these instructions.  Will watch your condition.  Will get help right away if you are not doing well or get worse.   This information is not intended to replace advice given to you by your health care provider. Make sure you discuss any questions you have with your health care provider.   Document Released: 12/31/2004 Document Revised: 01/21/2014 Document Reviewed: 07/03/2012 Elsevier Interactive Patient Education [employer].

## 2015-07-12 NOTE — ED Provider Notes (Signed)
Medical screening examination/treatment/procedure(s) were conducted as a shared visit with non-physician practitioner(s) and myself.  I personally evaluated the patient during the encounter.  70 year old female here with altered mental status. Received Narcan on the way here which improved her symptoms however on arrival here she became altered, confused and speaking so another dose of Narcan was given any improvement in symptoms. She had mild wheezing on exam but no other abnormal lung sounds. She had mild tachypnea but no respiratory distress. Heart exam was normal. She denies fevers suggest infection. Feel like the source of her altered mental status is over-medication.     EKG Interpretation   Date/Time:  Wednesday July 12 2015 09:41:27 EDT Ventricular Rate:  82 PR Interval:    QRS Duration: 55 QT Interval:  487 QTC Calculation: 569 R Axis:   98 Text Interpretation:  Sinus rhythm Multiple premature complexes, vent &  supraven Right axis deviation Borderline low voltage, extremity leads  Nonspecific T abnormalities, lateral leads Prolonged QT interval No  significant change since last tracing Confirmed by [facility] MD, Athenia Rys  [phone]) on 07/12/2015 9:51:45 AM        Merrily Pew, MD 07/13/15 [phone]

## 2015-10-29 ENCOUNTER — Encounter (HOSPITAL_COMMUNITY): Payer: Self-pay

## 2015-10-29 ENCOUNTER — Emergency Department (HOSPITAL_COMMUNITY)
Admission: EM | Admit: 2015-10-29 | Discharge: 2015-10-29 | Disposition: A | Discharge status: Home / Self Care | Payer: Medicare Other | Attending: Emergency Medicine | Admitting: Emergency Medicine

## 2015-10-29 DIAGNOSIS — Z79899 Other long term (current) drug therapy: Secondary | ICD-10-CM | POA: Insufficient documentation

## 2015-10-29 DIAGNOSIS — J340 Abscess, furuncle and carbuncle of nose: Secondary | ICD-10-CM | POA: Diagnosis not present

## 2015-10-29 DIAGNOSIS — F17210 Nicotine dependence, cigarettes, uncomplicated: Secondary | ICD-10-CM | POA: Insufficient documentation

## 2015-10-29 DIAGNOSIS — I10 Essential (primary) hypertension: Secondary | ICD-10-CM | POA: Insufficient documentation

## 2015-10-29 DIAGNOSIS — R220 Localized swelling, mass and lump, head: Secondary | ICD-10-CM | POA: Diagnosis present

## 2015-10-29 DIAGNOSIS — J449 Chronic obstructive pulmonary disease, unspecified: Secondary | ICD-10-CM | POA: Diagnosis not present

## 2015-10-29 MED ORDER — DOXYCYCLINE HYCLATE 100 MG PO CAPS: 100.0000 mg | ORAL_CAPSULE | Freq: Two times a day (BID) | ORAL | 0 refills | Status: DC

## 2015-10-29 MED ORDER — DOXYCYCLINE HYCLATE 100 MG PO TABS
100.0000 mg | ORAL_TABLET | Freq: Once | ORAL | Status: AC
  Administered 2015-10-29: 100 mg via ORAL
  Filled 2015-10-29: qty 1

## 2015-10-29 NOTE — ED Notes (Signed)
Patient Alert and oriented X4. Stable and ambulatory. Patient verbalized understanding of the discharge instructions.  Patient belongings were taken by the patient.  

## 2015-10-29 NOTE — ED Provider Notes (Signed)
[facility] DEPT Provider Note   CSN: [id number] Arrival date & time: 10/29/15  1630     History   Chief Complaint Chief Complaint  Patient presents with  . Facial Swelling    HPI Allison Vaughan is a 70 y.o. female.  HPI Patient presents the emergency department with several days of increasing redness to the tip of her nose.  She cut herself externally with a razor when she was trying to remove herhair.  She denies fevers or chills.  She states his been a faint amount of thin yellowish discharge coming from the left nostril.  She's concerned about the possibility of infection thus she comes the ER for evaluation.  No other complaints   Past Medical History:  Diagnosis Date  . Anxiety   . CAP (community acquired pneumonia) 04/18/2014  . Chronic hypoxemic respiratory failure ([place]) 06/26/2012  . Chronic pain   . COPD (chronic obstructive pulmonary disease) ([place])   . DEPRESSIVE DISORDER, NOS 03/13/2006   Qualifier: Diagnosis of  By: Damita Dunnings MD, [place], NO ESOPHAGITIS 03/13/2006   Qualifier: Diagnosis of  By: Damita Dunnings MD, Burt Ek 03/13/2006   Qualifier: Diagnosis of  By: Damita Dunnings MD, Phillip Heal    . HYPERTENSION, BENIGN SYSTEMIC 03/13/2006   Qualifier: Diagnosis of  By: Damita Dunnings MD, Phillip Heal    . Irritable bowel syndrome 03/13/2006   Qualifier: Diagnosis of  By: Damita Dunnings MD, Phillip Heal    . Lung nodule 04/16/2014  . MENOPAUSAL SYNDROME 03/13/2006   Qualifier: Diagnosis of  By: Damita Dunnings MD, Deirdre Evener, [place] SITES 03/13/2006   Qualifier: Diagnosis of  By: Damita Dunnings MD, Isac Caddy DEPENDENCE 03/13/2006   Qualifier: Diagnosis of  By: Damita Dunnings MD, Phillip Heal      Patient Active Problem List   Diagnosis Date Noted  . AKI (acute kidney injury) ([place]) 04/30/2014  . Pulmonary nodule 04/30/2014  . Essential hypertension 04/30/2014  . Hypokalemia 04/30/2014  . Hyponatremia 04/30/2014  . Hypotension 04/30/2014  . Nausea 04/30/2014  .  Lung nodule 04/16/2014  . Chronic hypoxemic respiratory failure ([place]) 06/26/2012  . COPD (chronic obstructive pulmonary disease) ([place]) 06/26/2012  . COPD exacerbation ([place]) 06/26/2012  . HYPERCHOLESTEROLEMIA 03/13/2006  . TOBACCO DEPENDENCE 03/13/2006  . Mood disorder ([place]) 03/13/2006  . HYPERTENSION, BENIGN SYSTEMIC 03/13/2006  . GASTROESOPHAGEAL REFLUX, NO ESOPHAGITIS 03/13/2006  . IRRITABLE BOWEL SYNDROME 03/13/2006  . MENOPAUSAL SYNDROME 03/13/2006  . OSTEOARTHRITIS, MULTI SITES 03/13/2006    Past Surgical History:  Procedure Laterality Date  . EYE SURGERY      OB History    No data available       Home Medications    Prior to Admission medications   Medication Sig Start Date End Date Taking? Authorizing Provider  albuterol (PROVENTIL HFA;VENTOLIN HFA) 108 (90 BASE) MCG/ACT inhaler Inhale 2 puffs into the lungs every 6 (six) hours as needed for wheezing. 06/09/12   Monika Salk, MD  albuterol (PROVENTIL) (2.5 MG/3ML) 0.083% nebulizer solution Take 3 mLs (2.5 mg total) by nebulization every 2 (two) hours as needed for wheezing. 04/20/14   Venetia Maxon Rama, MD  ALPRAZolam Duanne Moron) 1 MG tablet Take 1 mg by mouth 3 (three) times daily as needed for anxiety or sleep. For anxiety    Historical Provider, MD  amLODipine (NORVASC) 10 MG tablet Take 1 tablet (10 mg total) by mouth daily. 05/02/14   Kelvin Cellar, MD  budesonide (PULMICORT) 0.25 MG/2ML nebulizer  solution Take 2 mLs (0.25 mg total) by nebulization 2 (two) times daily. 04/20/14   Venetia Maxon Rama, MD  doxycycline (VIBRAMYCIN) 100 MG capsule Take 1 capsule (100 mg total) by mouth 2 (two) times daily. 10/29/15   Jola Schmidt, MD  FLUoxetine (PROZAC) 40 MG capsule Take 40 mg by mouth every morning.     Historical Provider, MD  guaiFENesin (MUCINEX) 600 MG 12 hr tablet Take 1,200 mg by mouth 2 (two) times daily as needed for congestion.    Historical Provider, MD  hydrochlorothiazide (HYDRODIURIL) 25 MG tablet Take 25 mg by mouth  daily.    Historical Provider, MD  ipratropium (ATROVENT) 0.02 % nebulizer solution Take 2.5 mLs (0.5 mg total) by nebulization 4 (four) times daily. 04/20/14   Venetia Maxon Rama, MD  omeprazole (PRILOSEC) 40 MG capsule Take 1 capsule (40 mg total) by mouth daily. Patient not taking: Reported on 07/12/2015 04/20/14   Venetia Maxon Rama, MD  Oxycodone HCl 10 MG TABS Take 10 mg by mouth 3 (three) times daily as needed. For pain    Historical Provider, MD  potassium chloride SA (K-DUR,KLOR-CON) 20 MEQ tablet Take 1 tablet (20 mEq total) by mouth daily. 07/12/15   Domenic Moras, PA-C  simvastatin (ZOCOR) 20 MG tablet Take 1 tablet by mouth at bedtime.  02/16/14   Historical Provider, MD  sulfamethoxazole-trimethoprim (BACTRIM DS,SEPTRA DS) 800-160 MG tablet Take 1 tablet by mouth 2 (two) times daily.    Historical Provider, MD    Family History History reviewed. No pertinent family history.  Social History Social History  Substance Use Topics  . Smoking status: Current Every Day Smoker    Packs/day: 1.00    Types: Cigarettes  . Smokeless tobacco: Never Used  . Alcohol use 1.2 oz/week    2 Cans of beer per week     Comment: per week     Allergies   Review of patient's allergies indicates no known allergies.   Review of Systems Review of Systems  All other systems reviewed and are negative.    Physical Exam Updated Vital Signs BP 137/63 (BP Location: Right Arm)   Pulse 79   Temp 98.4 F (36.9 C) (Oral)   Resp 18   Ht 5' (1.524 m)   Wt 124 lb (56.2 kg)   SpO2 98%   BMI 24.22 kg/m   Physical Exam  Constitutional: She is oriented to person, place, and time. She appears well-developed and well-nourished.  HENT:  Erythema and warmth to the distal nose.  There is a small abrasion of the left nostril at approximately 11:00.  No fluctuance or drainage.  Eyes: EOM are normal.  Neck: Normal range of motion.  Pulmonary/Chest: Effort normal.  Abdominal: She exhibits no distension.    Musculoskeletal: Normal range of motion.  Neurological: She is alert and oriented to person, place, and time.  Psychiatric: She has a normal mood and affect.  Nursing note and vitals reviewed.    ED Treatments / Results  Labs (all labs ordered are listed, but only abnormal results are displayed) Labs Reviewed - No data to display  EKG  EKG Interpretation None       Radiology No results found.  Procedures Procedures (including critical care time)  Medications Ordered in ED Medications  doxycycline (VIBRA-TABS) tablet 100 mg (not administered)     Initial Impression / Assessment and Plan / ED Course  I have reviewed the triage vital signs and the nursing notes.  Pertinent labs &  imaging results that were available during my care of the patient were reviewed by me and considered in my medical decision making (see chart for details).  Clinical Course    Appears to be cellulitis of the nose.  Patient will be started on twice daily doxycycline.  Warm compresses.  No signs to suggest deep space abscess at this time.  ENT follow-up if not improving.  She understands return to the ER for worsening symptoms    Final Clinical Impressions(s) / ED Diagnoses   Final diagnoses:  Cellulitis of nose    New Prescriptions New Prescriptions   DOXYCYCLINE (VIBRAMYCIN) 100 MG CAPSULE    Take 1 capsule (100 mg total) by mouth 2 (two) times daily.     Jola Schmidt, MD 10/29/15 2137

## 2015-10-29 NOTE — ED Triage Notes (Signed)
Pt complaining of nose pain. Pt with reddening and tenderness to anterior nose. Pt states cut nose attempting to remove hair, swelling began 3 days ago. Pt states yellow/green drainage from L side.

## 2016-03-06 ENCOUNTER — Other Ambulatory Visit (HOSPITAL_COMMUNITY): Payer: Self-pay | Admitting: Internal Medicine

## 2016-03-06 ENCOUNTER — Ambulatory Visit (HOSPITAL_COMMUNITY)
Admission: RE | Admit: 2016-03-06 | Discharge: 2016-03-06 | Disposition: A | Discharge status: Home / Self Care | Payer: Medicare Other | Source: Ambulatory Visit | Attending: Internal Medicine | Admitting: Internal Medicine

## 2016-03-06 DIAGNOSIS — R0602 Shortness of breath: Secondary | ICD-10-CM | POA: Insufficient documentation

## 2016-03-06 DIAGNOSIS — J849 Interstitial pulmonary disease, unspecified: Secondary | ICD-10-CM | POA: Insufficient documentation

## 2016-03-06 DIAGNOSIS — I517 Cardiomegaly: Secondary | ICD-10-CM | POA: Diagnosis not present

## 2016-04-29 DIAGNOSIS — F1920 Other psychoactive substance dependence, uncomplicated: Secondary | ICD-10-CM | POA: Diagnosis not present

## 2016-04-29 DIAGNOSIS — Z79899 Other long term (current) drug therapy: Secondary | ICD-10-CM | POA: Diagnosis not present

## 2016-05-02 DIAGNOSIS — I10 Essential (primary) hypertension: Secondary | ICD-10-CM | POA: Diagnosis not present

## 2016-05-02 DIAGNOSIS — J449 Chronic obstructive pulmonary disease, unspecified: Secondary | ICD-10-CM | POA: Diagnosis not present

## 2016-05-02 DIAGNOSIS — F3289 Other specified depressive episodes: Secondary | ICD-10-CM | POA: Diagnosis not present

## 2016-05-02 DIAGNOSIS — Z79899 Other long term (current) drug therapy: Secondary | ICD-10-CM | POA: Diagnosis not present

## 2016-05-07 ENCOUNTER — Other Ambulatory Visit: Payer: Self-pay | Admitting: Internal Medicine

## 2016-05-07 DIAGNOSIS — Z1231 Encounter for screening mammogram for malignant neoplasm of breast: Secondary | ICD-10-CM

## 2016-05-30 DIAGNOSIS — F419 Anxiety disorder, unspecified: Secondary | ICD-10-CM | POA: Diagnosis not present

## 2016-05-30 DIAGNOSIS — I10 Essential (primary) hypertension: Secondary | ICD-10-CM | POA: Diagnosis not present

## 2016-05-30 DIAGNOSIS — J449 Chronic obstructive pulmonary disease, unspecified: Secondary | ICD-10-CM | POA: Diagnosis not present

## 2016-05-30 DIAGNOSIS — M542 Cervicalgia: Secondary | ICD-10-CM | POA: Diagnosis not present

## 2016-07-03 DIAGNOSIS — I10 Essential (primary) hypertension: Secondary | ICD-10-CM | POA: Diagnosis not present

## 2016-07-03 DIAGNOSIS — F419 Anxiety disorder, unspecified: Secondary | ICD-10-CM | POA: Diagnosis not present

## 2016-07-03 DIAGNOSIS — J449 Chronic obstructive pulmonary disease, unspecified: Secondary | ICD-10-CM | POA: Diagnosis not present

## 2016-07-03 DIAGNOSIS — Z79899 Other long term (current) drug therapy: Secondary | ICD-10-CM | POA: Diagnosis not present

## 2016-07-08 DIAGNOSIS — Z79899 Other long term (current) drug therapy: Secondary | ICD-10-CM | POA: Diagnosis not present

## 2016-07-08 DIAGNOSIS — F1920 Other psychoactive substance dependence, uncomplicated: Secondary | ICD-10-CM | POA: Diagnosis not present

## 2017-01-14 DIAGNOSIS — R42 Dizziness and giddiness: Secondary | ICD-10-CM | POA: Diagnosis not present

## 2017-01-14 DIAGNOSIS — R404 Transient alteration of awareness: Secondary | ICD-10-CM | POA: Diagnosis not present

## 2017-01-22 ENCOUNTER — Emergency Department (HOSPITAL_COMMUNITY): Payer: Medicare HMO

## 2017-01-22 ENCOUNTER — Encounter (HOSPITAL_COMMUNITY): Payer: Self-pay | Admitting: *Deleted

## 2017-01-22 ENCOUNTER — Other Ambulatory Visit: Payer: Self-pay

## 2017-01-22 ENCOUNTER — Inpatient Hospital Stay (HOSPITAL_COMMUNITY): Payer: Medicare HMO

## 2017-01-22 ENCOUNTER — Inpatient Hospital Stay (HOSPITAL_COMMUNITY)
Admission: EM | Admit: 2017-01-22 | Discharge: 2017-01-24 | DRG: 896 | Disposition: A | Discharge status: Home / Self Care | Payer: Medicare HMO | Attending: Pulmonary Disease | Admitting: Pulmonary Disease

## 2017-01-22 DIAGNOSIS — R110 Nausea: Secondary | ICD-10-CM | POA: Diagnosis not present

## 2017-01-22 DIAGNOSIS — R251 Tremor, unspecified: Secondary | ICD-10-CM | POA: Diagnosis not present

## 2017-01-22 DIAGNOSIS — Z23 Encounter for immunization: Secondary | ICD-10-CM

## 2017-01-22 DIAGNOSIS — J9621 Acute and chronic respiratory failure with hypoxia: Secondary | ICD-10-CM | POA: Diagnosis not present

## 2017-01-22 DIAGNOSIS — E162 Hypoglycemia, unspecified: Secondary | ICD-10-CM | POA: Diagnosis present

## 2017-01-22 DIAGNOSIS — M1990 Unspecified osteoarthritis, unspecified site: Secondary | ICD-10-CM | POA: Diagnosis present

## 2017-01-22 DIAGNOSIS — F17210 Nicotine dependence, cigarettes, uncomplicated: Secondary | ICD-10-CM | POA: Diagnosis present

## 2017-01-22 DIAGNOSIS — F419 Anxiety disorder, unspecified: Secondary | ICD-10-CM | POA: Diagnosis present

## 2017-01-22 DIAGNOSIS — F329 Major depressive disorder, single episode, unspecified: Secondary | ICD-10-CM | POA: Diagnosis present

## 2017-01-22 DIAGNOSIS — I10 Essential (primary) hypertension: Secondary | ICD-10-CM | POA: Diagnosis present

## 2017-01-22 DIAGNOSIS — E876 Hypokalemia: Secondary | ICD-10-CM | POA: Diagnosis not present

## 2017-01-22 DIAGNOSIS — E7800 Pure hypercholesterolemia, unspecified: Secondary | ICD-10-CM | POA: Diagnosis present

## 2017-01-22 DIAGNOSIS — T50901A Poisoning by unspecified drugs, medicaments and biological substances, accidental (unintentional), initial encounter: Secondary | ICD-10-CM

## 2017-01-22 DIAGNOSIS — Z7951 Long term (current) use of inhaled steroids: Secondary | ICD-10-CM

## 2017-01-22 DIAGNOSIS — F1123 Opioid dependence with withdrawal: Secondary | ICD-10-CM | POA: Diagnosis not present

## 2017-01-22 DIAGNOSIS — Z79891 Long term (current) use of opiate analgesic: Secondary | ICD-10-CM | POA: Diagnosis not present

## 2017-01-22 DIAGNOSIS — J9690 Respiratory failure, unspecified, unspecified whether with hypoxia or hypercapnia: Secondary | ICD-10-CM | POA: Diagnosis not present

## 2017-01-22 DIAGNOSIS — G9340 Encephalopathy, unspecified: Secondary | ICD-10-CM | POA: Diagnosis present

## 2017-01-22 DIAGNOSIS — Z4682 Encounter for fitting and adjustment of non-vascular catheter: Secondary | ICD-10-CM | POA: Diagnosis not present

## 2017-01-22 DIAGNOSIS — K5900 Constipation, unspecified: Secondary | ICD-10-CM | POA: Diagnosis not present

## 2017-01-22 DIAGNOSIS — N289 Disorder of kidney and ureter, unspecified: Secondary | ICD-10-CM | POA: Diagnosis present

## 2017-01-22 DIAGNOSIS — J449 Chronic obstructive pulmonary disease, unspecified: Secondary | ICD-10-CM | POA: Diagnosis not present

## 2017-01-22 DIAGNOSIS — R079 Chest pain, unspecified: Secondary | ICD-10-CM | POA: Diagnosis not present

## 2017-01-22 DIAGNOSIS — F13239 Sedative, hypnotic or anxiolytic dependence with withdrawal, unspecified: Secondary | ICD-10-CM | POA: Diagnosis not present

## 2017-01-22 DIAGNOSIS — K219 Gastro-esophageal reflux disease without esophagitis: Secondary | ICD-10-CM | POA: Diagnosis present

## 2017-01-22 DIAGNOSIS — F39 Unspecified mood [affective] disorder: Secondary | ICD-10-CM | POA: Diagnosis present

## 2017-01-22 DIAGNOSIS — J9602 Acute respiratory failure with hypercapnia: Secondary | ICD-10-CM | POA: Diagnosis present

## 2017-01-22 DIAGNOSIS — R9401 Abnormal electroencephalogram [EEG]: Secondary | ICD-10-CM | POA: Diagnosis present

## 2017-01-22 DIAGNOSIS — Z9289 Personal history of other medical treatment: Secondary | ICD-10-CM

## 2017-01-22 DIAGNOSIS — G253 Myoclonus: Secondary | ICD-10-CM | POA: Diagnosis not present

## 2017-01-22 DIAGNOSIS — R0602 Shortness of breath: Secondary | ICD-10-CM | POA: Diagnosis not present

## 2017-01-22 DIAGNOSIS — Z79899 Other long term (current) drug therapy: Secondary | ICD-10-CM | POA: Diagnosis not present

## 2017-01-22 DIAGNOSIS — Z0189 Encounter for other specified special examinations: Secondary | ICD-10-CM

## 2017-01-22 DIAGNOSIS — G8929 Other chronic pain: Secondary | ICD-10-CM | POA: Diagnosis present

## 2017-01-22 DIAGNOSIS — K589 Irritable bowel syndrome without diarrhea: Secondary | ICD-10-CM | POA: Diagnosis present

## 2017-01-22 DIAGNOSIS — R4020 Unspecified coma: Secondary | ICD-10-CM | POA: Diagnosis not present

## 2017-01-22 HISTORY — DX: Respiratory failure, unspecified, unspecified whether with hypoxia or hypercapnia: J96.90

## 2017-01-22 LAB — I-STAT ARTERIAL BLOOD GAS, ED
Acid-Base Excess: 3 mmol/L — ABNORMAL HIGH (ref 0.0–2.0)
Acid-Base Excess: 3 mmol/L — ABNORMAL HIGH (ref 0.0–2.0)
Bicarbonate: 31.4 mmol/L — ABNORMAL HIGH (ref 20.0–28.0)
Bicarbonate: 28.6 mmol/L — ABNORMAL HIGH (ref 20.0–28.0)
O2 SAT: 91 %
O2 SAT: 100 %
PCO2 ART: 67.2 mmHg — AB (ref 32.0–48.0)
PO2 ART: 71 mmHg — AB (ref 83.0–108.0)
Patient temperature: 98.9
TCO2: 33 mmol/L — AB (ref 22–32)
TCO2: 30 mmol/L (ref 22–32)
pCO2 arterial: 48.8 mmHg — ABNORMAL HIGH (ref 32.0–48.0)
pH, Arterial: 7.279 — ABNORMAL LOW (ref 7.350–7.450)
pH, Arterial: 7.378 (ref 7.350–7.450)
pO2, Arterial: 436 mmHg — ABNORMAL HIGH (ref 83.0–108.0)

## 2017-01-22 LAB — CBC WITH DIFFERENTIAL/PLATELET
BASOS ABS: 0 10*3/uL (ref 0.0–0.1)
BASOS PCT: 0 %
EOS ABS: 0 10*3/uL (ref 0.0–0.7)
EOS PCT: 0 %
HCT: 40.7 % (ref 36.0–46.0)
Hemoglobin: 13.6 g/dL (ref 12.0–15.0)
LYMPHS PCT: 10 %
Lymphs Abs: 0.8 10*3/uL (ref 0.7–4.0)
MCH: 30.1 pg (ref 26.0–34.0)
MCHC: 33.4 g/dL (ref 30.0–36.0)
MCV: 90 fL (ref 78.0–100.0)
Monocytes Absolute: 0.9 10*3/uL (ref 0.1–1.0)
Monocytes Relative: 11 %
Neutro Abs: 6.5 10*3/uL (ref 1.7–7.7)
Neutrophils Relative %: 79 %
PLATELETS: 241 10*3/uL (ref 150–400)
RBC: 4.52 MIL/uL (ref 3.87–5.11)
RDW: 13.1 % (ref 11.5–15.5)
WBC: 8.2 10*3/uL (ref 4.0–10.5)

## 2017-01-22 LAB — BASIC METABOLIC PANEL
ANION GAP: 10 (ref 5–15)
BUN: 9 mg/dL (ref 6–20)
CALCIUM: 9.2 mg/dL (ref 8.9–10.3)
CO2: 31 mmol/L (ref 22–32)
Chloride: 92 mmol/L — ABNORMAL LOW (ref 101–111)
Creatinine, Ser: 1.26 mg/dL — ABNORMAL HIGH (ref 0.44–1.00)
GFR, EST AFRICAN AMERICAN: 48 mL/min — AB (ref >60)
GFR, EST NON AFRICAN AMERICAN: 41 mL/min — AB (ref >60)
Glucose, Bld: 52 mg/dL — ABNORMAL LOW (ref 65–99)
Potassium: 4.4 mmol/L (ref 3.5–5.1)
SODIUM: 133 mmol/L — AB (ref 135–145)

## 2017-01-22 LAB — CBC
HCT: 32.5 % — ABNORMAL LOW (ref 36.0–46.0)
Hemoglobin: 10.8 g/dL — ABNORMAL LOW (ref 12.0–15.0)
MCH: 29.8 pg (ref 26.0–34.0)
MCHC: 33.2 g/dL (ref 30.0–36.0)
MCV: 89.8 fL (ref 78.0–100.0)
PLATELETS: 148 10*3/uL — AB (ref 150–400)
RBC: 3.62 MIL/uL — ABNORMAL LOW (ref 3.87–5.11)
RDW: 13.3 % (ref 11.5–15.5)
WBC: 4.3 10*3/uL (ref 4.0–10.5)

## 2017-01-22 LAB — ACETAMINOPHEN LEVEL

## 2017-01-22 LAB — COMPREHENSIVE METABOLIC PANEL
ALT: 7 U/L — AB (ref 14–54)
AST: 15 U/L (ref 15–41)
Albumin: 3.2 g/dL — ABNORMAL LOW (ref 3.5–5.0)
Alkaline Phosphatase: 68 U/L (ref 38–126)
Anion gap: 9 (ref 5–15)
BUN: 9 mg/dL (ref 6–20)
CALCIUM: 8 mg/dL — AB (ref 8.9–10.3)
CHLORIDE: 99 mmol/L — AB (ref 101–111)
CO2: 26 mmol/L (ref 22–32)
CREATININE: 1.17 mg/dL — AB (ref 0.44–1.00)
GFR, EST AFRICAN AMERICAN: 52 mL/min — AB (ref >60)
GFR, EST NON AFRICAN AMERICAN: 45 mL/min — AB (ref >60)
Glucose, Bld: 72 mg/dL (ref 65–99)
Potassium: 3.8 mmol/L (ref 3.5–5.1)
Sodium: 134 mmol/L — ABNORMAL LOW (ref 135–145)
Total Bilirubin: 0.7 mg/dL (ref 0.3–1.2)
Total Protein: 5.2 g/dL — ABNORMAL LOW (ref 6.5–8.1)

## 2017-01-22 LAB — URINALYSIS, ROUTINE W REFLEX MICROSCOPIC
BILIRUBIN URINE: NEGATIVE
Bacteria, UA: NONE SEEN
Glucose, UA: NEGATIVE mg/dL
HGB URINE DIPSTICK: NEGATIVE
Ketones, ur: NEGATIVE mg/dL
LEUKOCYTES UA: NEGATIVE
Nitrite: NEGATIVE
PH: 5 (ref 5.0–8.0)
Protein, ur: 30 mg/dL — AB
SPECIFIC GRAVITY, URINE: 1.014 (ref 1.005–1.030)

## 2017-01-22 LAB — PHOSPHORUS: PHOSPHORUS: 4.2 mg/dL (ref 2.5–4.6)

## 2017-01-22 LAB — CBG MONITORING, ED
Glucose-Capillary: 74 mg/dL (ref 65–99)
Glucose-Capillary: 78 mg/dL (ref 65–99)

## 2017-01-22 LAB — PROTIME-INR
INR: 1.02
Prothrombin Time: 13.3 seconds (ref 11.4–15.2)

## 2017-01-22 LAB — LACTIC ACID, PLASMA
LACTIC ACID, VENOUS: 0.8 mmol/L (ref 0.5–1.9)
Lactic Acid, Venous: 0.9 mmol/L (ref 0.5–1.9)

## 2017-01-22 LAB — RAPID URINE DRUG SCREEN, HOSP PERFORMED
AMPHETAMINES: NOT DETECTED
BARBITURATES: NOT DETECTED
Benzodiazepines: POSITIVE — AB
Cocaine: NOT DETECTED
Opiates: POSITIVE — AB
TETRAHYDROCANNABINOL: NOT DETECTED

## 2017-01-22 LAB — CORTISOL: Cortisol, Plasma: 10.6 ug/dL

## 2017-01-22 LAB — PROCALCITONIN: Procalcitonin: <0.1 ng/mL

## 2017-01-22 LAB — I-STAT TROPONIN, ED: Troponin i, poc: 0.03 ng/mL (ref 0.00–0.08)

## 2017-01-22 LAB — TROPONIN I
Troponin I: 0.03 ng/mL (ref <0.03)
Troponin I: 0.03 ng/mL (ref <0.03)

## 2017-01-22 LAB — BRAIN NATRIURETIC PEPTIDE: B Natriuretic Peptide: 311.4 pg/mL — ABNORMAL HIGH (ref 0.0–100.0)

## 2017-01-22 LAB — MAGNESIUM
MAGNESIUM: 2.3 mg/dL (ref 1.7–2.4)
MAGNESIUM: 2 mg/dL (ref 1.7–2.4)

## 2017-01-22 LAB — LIPASE, BLOOD: LIPASE: 18 U/L (ref 11–51)

## 2017-01-22 LAB — AMYLASE: AMYLASE: 42 U/L (ref 28–100)

## 2017-01-22 LAB — APTT: aPTT: 32 seconds (ref 24–36)

## 2017-01-22 MED ORDER — FENTANYL CITRATE (PF) 100 MCG/2ML IJ SOLN
INTRAMUSCULAR | Status: AC
  Administered 2017-01-22: 12:00:00
  Filled 2017-01-22: qty 2

## 2017-01-22 MED ORDER — MIDAZOLAM HCL 2 MG/2ML IJ SOLN
INTRAMUSCULAR | Status: AC
  Administered 2017-01-22: 12:00:00
  Filled 2017-01-22: qty 2

## 2017-01-22 MED ORDER — MIDAZOLAM HCL 2 MG/2ML IJ SOLN: 1.0000 mg | INTRAMUSCULAR | Status: DC | PRN

## 2017-01-22 MED ORDER — PROPOFOL 1000 MG/100ML IV EMUL
5.0000 ug/kg/min | INTRAVENOUS | Status: DC
  Administered 2017-01-22: 40 ug/kg/min via INTRAVENOUS
  Administered 2017-01-23: 35 ug/kg/min via INTRAVENOUS
  Administered 2017-01-23: 30 ug/kg/min via INTRAVENOUS
  Filled 2017-01-22 (×2): qty 100

## 2017-01-22 MED ORDER — FENTANYL CITRATE (PF) 100 MCG/2ML IJ SOLN
50.0000 ug | INTRAMUSCULAR | Status: DC | PRN
  Administered 2017-01-23: 50 ug via INTRAVENOUS
  Filled 2017-01-22: qty 2

## 2017-01-22 MED ORDER — LORAZEPAM 2 MG/ML IJ SOLN
1.0000 mg | Freq: Once | INTRAMUSCULAR | Status: AC
  Administered 2017-01-22: 1 mg via INTRAVENOUS
  Filled 2017-01-22: qty 1

## 2017-01-22 MED ORDER — PROPOFOL 1000 MG/100ML IV EMUL
INTRAVENOUS | Status: AC
  Administered 2017-01-22: 40 ug/kg/min
  Administered 2017-01-22: 5 mL
  Filled 2017-01-22: qty 100

## 2017-01-22 MED ORDER — PANTOPRAZOLE SODIUM 40 MG IV SOLR
40.0000 mg | Freq: Every day | INTRAVENOUS | Status: DC
  Administered 2017-01-22: 40 mg via INTRAVENOUS
  Filled 2017-01-22: qty 40

## 2017-01-22 MED ORDER — ALBUTEROL SULFATE (2.5 MG/3ML) 0.083% IN NEBU
5.0000 mg | INHALATION_SOLUTION | Freq: Once | RESPIRATORY_TRACT | Status: DC
  Filled 2017-01-22: qty 6

## 2017-01-22 MED ORDER — IPRATROPIUM-ALBUTEROL 0.5-2.5 (3) MG/3ML IN SOLN
3.0000 mL | Freq: Four times a day (QID) | RESPIRATORY_TRACT | Status: DC
  Administered 2017-01-22 – 2017-01-24 (×7): 3 mL via RESPIRATORY_TRACT
  Filled 2017-01-22 (×7): qty 3

## 2017-01-22 MED ORDER — CHLORHEXIDINE GLUCONATE 0.12% ORAL RINSE (MEDLINE KIT)
15.0000 mL | Freq: Two times a day (BID) | OROMUCOSAL | Status: DC
  Administered 2017-01-22 – 2017-01-23 (×2): 15 mL via OROMUCOSAL

## 2017-01-22 MED ORDER — SODIUM CHLORIDE 0.9 % IV SOLN: 250.0000 mL | INTRAVENOUS | Status: DC | PRN

## 2017-01-22 MED ORDER — THIAMINE HCL 100 MG/ML IJ SOLN
100.0000 mg | Freq: Every day | INTRAMUSCULAR | Status: DC
  Administered 2017-01-22 – 2017-01-23 (×2): 100 mg via INTRAVENOUS
  Filled 2017-01-22 (×3): qty 2

## 2017-01-22 MED ORDER — MIDAZOLAM HCL 2 MG/2ML IJ SOLN
1.0000 mg | INTRAMUSCULAR | Status: DC | PRN
  Administered 2017-01-22: 1 mg via INTRAVENOUS
  Filled 2017-01-22: qty 2

## 2017-01-22 MED ORDER — ORAL CARE MOUTH RINSE
15.0000 mL | OROMUCOSAL | Status: DC
  Administered 2017-01-23 (×6): 15 mL via OROMUCOSAL

## 2017-01-22 MED ORDER — FOLIC ACID 5 MG/ML IJ SOLN
1.0000 mg | Freq: Every day | INTRAMUSCULAR | Status: DC
  Administered 2017-01-22 – 2017-01-24 (×3): 1 mg via INTRAVENOUS
  Filled 2017-01-22 (×3): qty 0.2

## 2017-01-22 MED ORDER — SODIUM CHLORIDE 0.9 % IV SOLN
INTRAVENOUS | Status: DC
  Administered 2017-01-22 – 2017-01-23 (×3): via INTRAVENOUS

## 2017-01-22 MED ORDER — IPRATROPIUM-ALBUTEROL 0.5-2.5 (3) MG/3ML IN SOLN
3.0000 mL | Freq: Once | RESPIRATORY_TRACT | Status: AC
  Administered 2017-01-22: 3 mL via RESPIRATORY_TRACT
  Filled 2017-01-22: qty 3

## 2017-01-22 MED ORDER — HEPARIN SODIUM (PORCINE) 5000 UNIT/ML IJ SOLN
5000.0000 [IU] | Freq: Three times a day (TID) | INTRAMUSCULAR | Status: DC
  Administered 2017-01-22 – 2017-01-24 (×6): 5000 [IU] via SUBCUTANEOUS
  Filled 2017-01-22 (×6): qty 1

## 2017-01-22 MED ORDER — FENTANYL CITRATE (PF) 100 MCG/2ML IJ SOLN
50.0000 ug | INTRAMUSCULAR | Status: DC | PRN
  Administered 2017-01-22 (×2): 50 ug via INTRAVENOUS
  Filled 2017-01-22 (×2): qty 2

## 2017-01-22 MED ORDER — SODIUM CHLORIDE 0.9 % IV BOLUS (SEPSIS)
1000.0000 mL | Freq: Once | INTRAVENOUS | Status: AC
  Administered 2017-01-22: 1000 mL via INTRAVENOUS

## 2017-01-22 MED ORDER — PROPOFOL 1000 MG/100ML IV EMUL
INTRAVENOUS | Status: AC
  Filled 2017-01-22: qty 100

## 2017-01-22 NOTE — ED Provider Notes (Signed)
Medical screening examination/treatment/procedure(s) were conducted as a shared visit with non-physician practitioner(s) and myself.  I personally evaluated the patient during the encounter.   EKG Interpretation None      71yF with change in mental status. Daughter reports confusion, agitation, sweating profusely since last night. I suspect she is withdrawing. In past two weeks she has been weaning from benzos after being on for years. Chronic opiate use and just started methadone a few days ago.   On my exam she is awake, but very confused/agitated. Muttering to herself and continually pulling on her gown and monitoring equipment. She is briefly redirectable and answering simple questions. She needs sedation, but already has PCO2 of almost 70 and will realistically only worsen with meds. Will try her on BiPAP although her mental status is currently teetering on this being questionably appropriate.   Will discuss with CCM. My concern is for worsening mental status/drowsiness and subsequent worsening respiratory failure and also potential problems protecting her airway.    CRITICAL CARE Performed by: Virgel Manifold Total critical care time: 40 minutes Critical care time was exclusive of separately billable procedures and treating other patients. Critical care was necessary to treat or prevent imminent or life-threatening deterioration. Critical care was time spent personally by me on the following activities: development of treatment plan with patient and/or surrogate as well as nursing, discussions with consultants, evaluation of patient's response to treatment, examination of patient, obtaining history from patient or surrogate, ordering and performing treatments and interventions, ordering and review of laboratory studies, ordering and review of radiographic studies, pulse oximetry and re-evaluation of patient's condition.    Virgel Manifold, MD 01/22/17 1147

## 2017-01-22 NOTE — ED Notes (Signed)
Placed on 3L [place] oxygen in triage. Sats up to 90%

## 2017-01-22 NOTE — Procedures (Signed)
ELECTROENCEPHALOGRAM REPORT  Date of Study: 01/22/2017  Patient's Name: Allison Vaughan MRN: [id number] Date of Birth: Feb 26, 1945  Referring Provider: Dr. Marshell Garfinkel  Clinical History: This is a 72 year old man with respiratory distress followed by unresponsiveness with body jerks.  Medications: Propofol Fentanyl Versed  Technical Summary: A multichannel digital EEG recording measured by the international 10-20 system with electrodes applied with paste and impedances below 5000 ohms performed as portable with EKG monitoring in an intubated and sedated patient.  Hyperventilation and photic stimulation were not performed.  The digital EEG was referentially recorded, reformatted, and digitally filtered in a variety of bipolar and referential montages for optimal display.   Description: The patient is intubated and sedated on Propofol during the recording. She is given additional doses of Fentanyl and Versed during the study. There is no clear posterior dominant rhythm. The background consists of a moderate amount of 4-5 Hz theta and 2-3 Hz delta slowing admixed with diffuse alpha and beta activity. Normal sleep architecture is not seen. Hyperventilation and photic stimulation were not performed. She is noted to be tremulous with slight body jerks at the beginning of the recording, with no associated epileptiform correlate seen. There were no epileptiform discharges or electrographic seizures seen.    EKG lead was unremarkable.  Impression: This sedated EEG is abnormal due to moderate diffuse background slowing.  Clinical Correlation of the above findings indicates diffuse cerebral dysfunction that is non-specific in etiology and can be seen with hypoxic/ischemic injury, toxic/metabolic encephalopathies, or medication effect. Body jerking and tremulousness did not show any epileptiform correlate.  The absence of epileptiform discharges does not rule out a clinical diagnosis of epilepsy.   Clinical correlation is advised.   Ellouise Newer, M.D.

## 2017-01-22 NOTE — ED Notes (Signed)
CT Head delayed per MD until Critical Care can see patient.

## 2017-01-22 NOTE — ED Notes (Signed)
Labs drawn in triage.  

## 2017-01-22 NOTE — ED Notes (Signed)
ED Provider at bedside and is aware of changes.

## 2017-01-22 NOTE — Procedures (Signed)
Intubation Procedure Note Allison Vaughan [id number] 09/08/1945  Procedure: Intubation Indications: Respiratory insufficiency  Procedure Details Consent: Risks of procedure as well as the alternatives and risks of each were explained to the (patient/caregiver).  Consent for procedure obtained. Time Out: Verified patient identification, verified procedure, site/side was marked, verified correct patient position, special equipment/implants available, medications/allergies/relevent history reviewed, required imaging and test results available.  Performed  Maximum sterile technique was used including gloves and mask.  Miller size 3. Grade 1 view. 1 attempt  Evaluation Hemodynamic Status: BP stable throughout; O2 sats: stable throughout Patient's Current Condition: stable Complications: No apparent complications Patient did tolerate procedure well. Chest X-ray ordered to verify placement.  CXR: pending.   Allison Vaughan 01/22/2017

## 2017-01-22 NOTE — ED Notes (Signed)
Patient transported to CT per MD

## 2017-01-22 NOTE — Progress Notes (Signed)
Bedside EEG completed, results pending. 

## 2017-01-22 NOTE — ED Provider Notes (Signed)
[facility] Provider Note   CSN: [id number] Arrival date & time: 01/22/17  5400     History   Chief Complaint Chief Complaint  Patient presents with  . Shortness of Breath    HPI Allison Vaughan is a 72 y.o. female w/ h/o tobacco abuse, COPD, HTN, IBS presents for evaluation of nausea, generalized shakiness, sweats and low oxygen saturations. Noted to have low oxygen saturations at methadone clinic this morning and sent to ED, did not receive methadone dose today.  Had sweats all last night requiring multiple change in clothes/sheets and began having shakes this morning. Daughter at bedside states her speech is different from baseline and has not had a BM in 5 days. No fevers, vomiting, CP, dysuria, hematuria, falls. No anticoagulation. Denies illicit drug use. Started weaning off of xanax two weeks ago and pain pills four days ago, has received methadone dosing at clinic the last 4 days.   HPI  Past Medical History:  Diagnosis Date  . Anxiety   . CAP (community acquired pneumonia) 04/18/2014  . Chronic hypoxemic respiratory failure ([place]) 06/26/2012  . Chronic pain   . COPD (chronic obstructive pulmonary disease) ([place])   . DEPRESSIVE DISORDER, NOS 03/13/2006   Qualifier: Diagnosis of  By: Damita Dunnings MD, [place], NO ESOPHAGITIS 03/13/2006   Qualifier: Diagnosis of  By: Damita Dunnings MD, Burt Ek 03/13/2006   Qualifier: Diagnosis of  By: Damita Dunnings MD, Phillip Heal    . HYPERTENSION, BENIGN SYSTEMIC 03/13/2006   Qualifier: Diagnosis of  By: Damita Dunnings MD, Phillip Heal    . Irritable bowel syndrome 03/13/2006   Qualifier: Diagnosis of  By: Damita Dunnings MD, Phillip Heal    . Lung nodule 04/16/2014  . MENOPAUSAL SYNDROME 03/13/2006   Qualifier: Diagnosis of  By: Damita Dunnings MD, Deirdre Evener, [place] SITES 03/13/2006   Qualifier: Diagnosis of  By: Damita Dunnings MD, Isac Caddy DEPENDENCE 03/13/2006   Qualifier: Diagnosis of  By:  Damita Dunnings MD, Phillip Heal      Patient Active Problem List   Diagnosis Date Noted  . AKI (acute kidney injury) ([place]) 04/30/2014  . Pulmonary nodule 04/30/2014  . Essential hypertension 04/30/2014  . Hypokalemia 04/30/2014  . Hyponatremia 04/30/2014  . Hypotension 04/30/2014  . Nausea 04/30/2014  . Lung nodule 04/16/2014  . Chronic hypoxemic respiratory failure ([place]) 06/26/2012  . COPD (chronic obstructive pulmonary disease) ([place]) 06/26/2012  . COPD exacerbation ([place]) 06/26/2012  . HYPERCHOLESTEROLEMIA 03/13/2006  . TOBACCO DEPENDENCE 03/13/2006  . Mood disorder ([place]) 03/13/2006  . HYPERTENSION, BENIGN SYSTEMIC 03/13/2006  . GASTROESOPHAGEAL REFLUX, NO ESOPHAGITIS 03/13/2006  . IRRITABLE BOWEL SYNDROME 03/13/2006  . MENOPAUSAL SYNDROME 03/13/2006  . OSTEOARTHRITIS, MULTI SITES 03/13/2006    Past Surgical History:  Procedure Laterality Date  . EYE SURGERY      OB History    No data available       Home Medications    Prior to Admission medications   Medication Sig Start Date End Date Taking? Authorizing Provider  albuterol (PROVENTIL HFA;VENTOLIN HFA) 108 (90 BASE) MCG/ACT inhaler Inhale 2 puffs into the lungs every 6 (six) hours as needed for wheezing. 06/09/12  Yes Oti, Brantley Stage, MD  albuterol (PROVENTIL) (2.5 MG/3ML) 0.083% nebulizer solution Take 3 mLs (2.5 mg total) by nebulization every 2 (two) hours as needed for wheezing. 04/20/14  Yes Rama, Venetia Maxon, MD  amLODipine (NORVASC) 10 MG tablet Take 1 tablet (10 mg total)  by mouth daily. 05/02/14  Yes Kelvin Cellar, MD  budesonide (PULMICORT) 0.25 MG/2ML nebulizer solution Take 2 mLs (0.25 mg total) by nebulization 2 (two) times daily. 04/20/14  Yes Rama, Venetia Maxon, MD  FLUoxetine (PROZAC) 40 MG capsule Take 40 mg by mouth every morning.    Yes [provider]  guaiFENesin (MUCINEX) 600 MG 12 hr tablet Take 1,200 mg by mouth 2 (two) times daily as needed for congestion.   Yes [provider]    hydrochlorothiazide (HYDRODIURIL) 25 MG tablet Take 25 mg by mouth daily.   Yes [provider]  ipratropium (ATROVENT) 0.02 % nebulizer solution Take 2.5 mLs (0.5 mg total) by nebulization 4 (four) times daily. 04/20/14  Yes Rama, Venetia Maxon, MD  methadone (DOLOPHINE) 1 MG/1ML solution Take 30 mg by mouth daily. [facility].   Yes [provider]  potassium chloride SA (K-DUR,KLOR-CON) 20 MEQ tablet Take 1 tablet (20 mEq total) by mouth daily. 07/12/15  Yes Domenic Moras, PA-C  simvastatin (ZOCOR) 20 MG tablet Take 1 tablet by mouth at bedtime.  02/16/14  Yes [provider]  doxycycline (VIBRAMYCIN) 100 MG capsule Take 1 capsule (100 mg total) by mouth 2 (two) times daily. Patient not taking: Reported on 01/22/2017 10/29/15   Jola Schmidt, MD  omeprazole (PRILOSEC) 40 MG capsule Take 1 capsule (40 mg total) by mouth daily. Patient not taking: Reported on 07/12/2015 04/20/14   Rama, Venetia Maxon, MD    Family History No family history on file.  Social History Social History   Tobacco Use  . Smoking status: Current Every Day Smoker    Packs/day: 1.00    Types: Cigarettes  . Smokeless tobacco: Never Used  Substance Use Topics  . Alcohol use: Yes    Alcohol/week: 1.2 oz    Types: 2 Cans of beer per week    Comment: per week  . Drug use: No     Allergies   Patient has no known allergies.   Review of Systems Review of Systems  Constitutional: Positive for chills and diaphoresis.  Respiratory: Positive for cough (chronic), shortness of breath and wheezing.   Gastrointestinal: Positive for constipation and nausea.  Musculoskeletal: Positive for back pain (chronic).  Neurological: Positive for tremors.  All other systems reviewed and are negative.    Physical Exam Updated Vital Signs BP 114/73 (BP Location: Left Arm)   Pulse 74   Temp 99.7 F (37.6 C) (Rectal)   Resp 16   Ht 4\' 11"  (1.499 m)   Wt 58.1 kg (128 lb)   SpO2 95%   BMI 25.85  kg/m   Physical Exam  Constitutional: She is oriented to person, place, and time. She appears well-developed and well-nourished. No distress.  NAD. A x O to self, place, time event.   HENT:  Head: Normocephalic and atraumatic.  Right Ear: External ear normal.  Left Ear: External ear normal.  Nose: Nose normal.  Dry mucous membranes.   Eyes: Conjunctivae and EOM are normal. No scleral icterus.  Neck: Normal range of motion. Neck supple.  Cardiovascular: Normal rate, regular rhythm and normal heart sounds.  No murmur heard. Pulmonary/Chest: No respiratory distress. She has wheezes.  Abdominal: Soft. There is tenderness.  Suprapubic tenderness. No CVAT. Soft, non distended.   Musculoskeletal: Normal range of motion. She exhibits no deformity.  Neurological: She is alert and oriented to person, place, and time.  Intermittent, generalized tremors mostly to extremities. Speech is slow. Strength 5/5 with hand grip  and ankle F/E.  Sensation to light touch intact in hands and feet.  No pronator drift. CN I and VIII not tested. CN II-XII intact bilaterally.   Skin: Skin is warm and dry. Capillary refill takes less than 2 seconds.  No diaphoresis   Psychiatric: She has a normal mood and affect. Her behavior is normal. Judgment and thought content normal.  Nursing note and vitals reviewed.    ED Treatments / Results  Labs (all labs ordered are listed, but only abnormal results are displayed) Labs Reviewed  BASIC METABOLIC PANEL - Abnormal; Notable for the following components:      Result Value   Sodium 133 (*)    Chloride 92 (*)    Glucose, Bld 52 (*)    Creatinine, Ser 1.26 (*)    GFR calc non Af Amer 41 (*)    GFR calc Af Amer 48 (*)    All other components within normal limits  ACETAMINOPHEN LEVEL - Abnormal; Notable for the following components:   Acetaminophen (Tylenol), Serum <10 (*)    All other components within normal limits  BRAIN NATRIURETIC PEPTIDE - Abnormal; Notable  for the following components:   B Natriuretic Peptide 311.4 (*)    All other components within normal limits  I-STAT ARTERIAL BLOOD GAS, ED - Abnormal; Notable for the following components:   pH, Arterial 7.279 (*)    pCO2 arterial 67.2 (*)    pO2, Arterial 71.0 (*)    Bicarbonate 31.4 (*)    TCO2 33 (*)    Acid-Base Excess 3.0 (*)    All other components within normal limits  CBC WITH DIFFERENTIAL/PLATELET  MAGNESIUM  URINALYSIS, ROUTINE W REFLEX MICROSCOPIC  RAPID URINE DRUG SCREEN, HOSP PERFORMED  BLOOD GAS, ARTERIAL  CBG MONITORING, ED  I-STAT TROPONIN, ED    EKG  EKG Interpretation None       Radiology Dg Chest 2 View  Result Date: 01/22/2017 CLINICAL DATA:  Shortness of breath and chest pain EXAM: CHEST  2 VIEW COMPARISON:  March 06, 2016 FINDINGS: There is no edema or consolidation. The heart is upper normal in size with pulmonary vascularity within normal limits. There is aortic atherosclerosis. No adenopathy. There is evidence of old trauma involving the lateral right clavicle. There is degenerative change in the thoracic spine. IMPRESSION: No edema or consolidation. Stable cardiac silhouette. There is aortic atherosclerosis. Aortic Atherosclerosis (ICD10-I70.0). Electronically Signed   By: Lowella Grip III M.D.   On: 01/22/2017 08:57   Ct Head Wo Contrast  Result Date: 01/22/2017 CLINICAL DATA:  Altered level of consciousness. Patient is on methadone. EXAM: CT HEAD WITHOUT CONTRAST TECHNIQUE: Contiguous axial images were obtained from the base of the skull through the vertex without intravenous contrast. COMPARISON:  07/12/2015 FINDINGS: Brain: No evidence of acute infarction, hemorrhage, extra-axial collection, ventriculomegaly, or mass effect. Generalized cerebral atrophy. Periventricular white matter low attenuation likely secondary to microangiopathy. Vascular: Cerebrovascular atherosclerotic calcifications are noted. Skull: Negative for fracture or focal lesion.  Sinuses/Orbits: Visualized portions of the orbits are unremarkable. Visualized portions of the paranasal sinuses and mastoid air cells are unremarkable. Other: None. IMPRESSION: 1. No acute intracranial pathology. 2. Chronic microvascular disease and cerebral atrophy. Electronically Signed   By: Kathreen Devoid   On: 01/22/2017 10:56    Procedures Procedures (including critical care time)  CRITICAL CARE Performed by: Kinnie Feil   Total critical care time: 60 minutes  Critical care time was exclusive of separately billable procedures and treating other patients.  Critical care was necessary to treat or prevent imminent or life-threatening deterioration.  Critical care was time spent personally by me on the following activities: development of treatment plan with patient and/or surrogate as well as nursing, discussions with consultants, evaluation of patient's response to treatment, examination of patient, obtaining history from patient or surrogate, ordering and performing treatments and interventions, ordering and review of laboratory studies, ordering and review of radiographic studies, pulse oximetry and re-evaluation of patient's condition.  MDM Reviewed: previous chart, nursing note and vitals Reviewed previous: labs, ECG, CT scan and x-ray Interpretation: labs, ECG, CT scan and x-ray Total time providing critical care: 30-74 minutes. This excludes time spent performing separately reportable procedures and services. Consults: critical care     Medications Ordered in ED Medications  fentaNYL (SUBLIMAZE) 100 MCG/2ML injection (not administered)  midazolam (VERSED) 2 MG/2ML injection (not administered)  propofol (DIPRIVAN) 1000 MG/100ML infusion (not administered)  ipratropium-albuterol (DUONEB) 0.5-2.5 (3) MG/3ML nebulizer solution 3 mL (3 mLs Nebulization Given 01/22/17 0918)  sodium chloride 0.9 % bolus 1,000 mL (1,000 mLs Intravenous New Bag/Given 01/22/17 0948)  LORazepam  (ATIVAN) injection 1 mg (1 mg Intravenous Given 01/22/17 0949)  LORazepam (ATIVAN) injection 1 mg (1 mg Intravenous Given 01/22/17 1021)     Initial Impression / Assessment and Plan / ED Course  I have reviewed the triage vital signs and the nursing notes.  Pertinent labs & imaging results that were available during my care of the patient were reviewed by me and considered in my medical decision making (see chart for details).  Clinical Course as of Jan 22 1126  Wed Jan 22, 2017  0958 pH, Arterial: (!) 7.279 [CG]  0958 pCO2 arterial: (!!) 67.2 [CG]  0958 pO2, Arterial: (!) 71.0 [CG]  0958 Bicarbonate: (!) 31.4 [CG]  0958 Creatinine: (!) 1.26 [CG]  1024 Updated family regarding plan to admit to ICU. They are agreeable. [name] (daughter) [phone] for updates.   [CG]  1024 Re-evaluated pt. She is snoring, appears to be protecting her airways. 90% O2 on [place].  [CG]    Clinical Course User Index [CG] Kinnie Feil, PA-C   72 yo female presents with hypoxia, generalized tremors, nausea, sweats. Currently on day 4 of methadone for chronic pain medications. Weaned off Xanax 2 weeks ago. Did not receive methadone dose this morning at clinic due to oxygen saturations.   On exam, she looks like she is in withdrawal with generalized tremors and slow, slurred speech, slightly confused. She has mild suprapubic tenderness. Will get screening labs, give ativan and reassess.  Final Clinical Impressions(s) / ED Diagnoses   ABG pH 7.2, pCO2 67.2, pO2 71, bicarb 31.4. Patient shared with supervising physician who evaluated patient, recommending BiPAP. Will consult critical care consult for admission for opioid withdrawal management.   1030: re-evaluated pt. She is asleep, snoring loudly with jaw drop and continued sporadic whole body twitching. Will accompany to CT scan. Spoke to critical care MD who will see patient.  1125: Pt unresponsive to painful stimuli. Nasal and oral airway placed by critical  care NP. Decision made to intubate. Intubated by critical care MD. Pending portable CXR. Final diagnoses:  None    ED Discharge Orders    None       Arlean Hopping 01/22/17 1129    Virgel Manifold, MD 01/22/17 1410

## 2017-01-22 NOTE — ED Triage Notes (Signed)
To ED for eval of sob and aloc per family. Per pts daughter she is on Methadone due to pain pills. Went to Methadone clinic this am and they wouldn't dose her due to low sats (86%) - pt is alert in triage but mumbles. Per daughter pt is confused.

## 2017-01-22 NOTE — ED Notes (Signed)
Patient transported to X-ray 

## 2017-01-22 NOTE — Progress Notes (Signed)
Pt transported on vent from ED to [place].  Pt's vitals remained stable throughout the trip.

## 2017-01-22 NOTE — ED Notes (Signed)
RT in room for ABG

## 2017-01-22 NOTE — H&P (Signed)
PULMONARY / CRITICAL CARE MEDICINE   Name: Allison Vaughan MRN: [id number] DOB: April 12, 1945    ADMISSION DATE:  01/22/2017   REFERRING MD: Emergency department physician  CHIEF COMPLAINT: Altered mental status, inability to protect airway.  HISTORY OF PRESENT ILLNESS:   Allison Vaughan is a 72 year old female with a known history of chronic opiate use, benzodiazepine use, who is been weaned off Xanax over the last 2 weeks by her family and has been using the methadone clinic to come off chronic opioids over the last 4 days.  She presented to [facility] emergency department on 01/22/2017 with confusion difficulty breathing. At the time of admission she is awake alert following commands and joking.  After she returned from CT scan of the head she became poorly responsive unable to protect her airway and demonstrated jerking myoclonic type movements of her extremities.  Pulmonary critical care medicine at the bedside and she was intubated urgently for poor airway protection and altered mental status. Question if this is some form of methadone or narcotic withdrawal syndrome.  She will be placed on a propofol drip for sedation she may need  further workup per neurology.  Per report she drinks 2 cans of beer a week we will start her on thiamine and folic acid for completeness as this may be alcohol withdrawal rather than drug withdrawal.  She was admitted to the intensive care unit on full mechanical ventilatory support to the pulmonary critical care service.  PAST MEDICAL HISTORY :  She  has a past medical history of Anxiety, CAP (community acquired pneumonia) (04/18/2014), Chronic hypoxemic respiratory failure ([place]) (06/26/2012), Chronic pain, COPD (chronic obstructive pulmonary disease) ([place]), DEPRESSIVE DISORDER, NOS (03/13/2006), GASTROESOPHAGEAL REFLUX, NO ESOPHAGITIS (03/13/2006), HYPERCHOLESTEROLEMIA (03/13/2006), HYPERTENSION, BENIGN SYSTEMIC (03/13/2006), Irritable bowel syndrome (03/13/2006), Lung  nodule (04/16/2014), MENOPAUSAL SYNDROME (03/13/2006), OSTEOARTHRITIS, MULTI SITES (03/13/2006), and TOBACCO DEPENDENCE (03/13/2006).  PAST SURGICAL HISTORY: She  has a past surgical history that includes Eye surgery.  No Known Allergies  No current facility-administered medications on file prior to encounter.    Current Outpatient Medications on File Prior to Encounter  Medication Sig  . albuterol (PROVENTIL HFA;VENTOLIN HFA) 108 (90 BASE) MCG/ACT inhaler Inhale 2 puffs into the lungs every 6 (six) hours as needed for wheezing.  Marland Kitchen albuterol (PROVENTIL) (2.5 MG/3ML) 0.083% nebulizer solution Take 3 mLs (2.5 mg total) by nebulization every 2 (two) hours as needed for wheezing.  Marland Kitchen amLODipine (NORVASC) 10 MG tablet Take 1 tablet (10 mg total) by mouth daily.  . budesonide (PULMICORT) 0.25 MG/2ML nebulizer solution Take 2 mLs (0.25 mg total) by nebulization 2 (two) times daily.  Marland Kitchen FLUoxetine (PROZAC) 40 MG capsule Take 40 mg by mouth every morning.   Marland Kitchen guaiFENesin (MUCINEX) 600 MG 12 hr tablet Take 1,200 mg by mouth 2 (two) times daily as needed for congestion.  . hydrochlorothiazide (HYDRODIURIL) 25 MG tablet Take 25 mg by mouth daily.  Marland Kitchen ipratropium (ATROVENT) 0.02 % nebulizer solution Take 2.5 mLs (0.5 mg total) by nebulization 4 (four) times daily.  . methadone (DOLOPHINE) 1 MG/1ML solution Take 30 mg by mouth daily. [facility].  . potassium chloride SA (K-DUR,KLOR-CON) 20 MEQ tablet Take 1 tablet (20 mEq total) by mouth daily.  . simvastatin (ZOCOR) 20 MG tablet Take 1 tablet by mouth at bedtime.   Marland Kitchen doxycycline (VIBRAMYCIN) 100 MG capsule Take 1 capsule (100 mg total) by mouth 2 (two) times daily. (Patient not taking: Reported on 01/22/2017)  . omeprazole (PRILOSEC) 40 MG capsule  Take 1 capsule (40 mg total) by mouth daily. (Patient not taking: Reported on 07/12/2015)    FAMILY HISTORY:  Her has no family status information on file.    SOCIAL HISTORY: She  reports that she has  been smoking cigarettes.  She has been smoking about 1.00 pack per day. she has never used smokeless tobacco. She reports that she drinks about 1.2 oz of alcohol per week. She reports that she does not use drugs.  REVIEW OF SYSTEMS:   Not available  SUBJECTIVE:  72 year old female intubated for inability to protect airway and altered mental status.  VITAL SIGNS: BP 114/73 (BP Location: Left Arm)   Pulse 74   Temp 99.7 F (37.6 C) (Rectal)   Resp 16   Ht 4\' 11"  (1.499 m)   Wt 58.1 kg (128 lb)   SpO2 95%   BMI 25.85 kg/m   HEMODYNAMICS:    VENTILATOR SETTINGS:    INTAKE / OUTPUT: No intake/output data recorded.  PHYSICAL EXAMINATION: General: Cachectic female currently intubated on full mechanical ventilatory support Neuro: Prior to interventions was unresponsive to verbal and noxious stimuli.  She was due demonstrated bilateral jerky myoclonic type movements. HEENT: Disconjugate gaze, pupils equal reactive to 3 mm Positive JVD Cardiovascular: Heart sounds are regular regular rate and rhythm Lungs: Coarse rhonchi bilaterally Abdomen: Soft nontender positive bowel sounds Musculoskeletal: Essentially intact Skin: Warm and dry  LABS:  BMET Recent Labs  Lab 01/22/17 0746  NA 133*  K 4.4  CL 92*  CO2 31  BUN 9  CREATININE 1.26*  GLUCOSE 52*    Electrolytes Recent Labs  Lab 01/22/17 0746  CALCIUM 9.2  MG 2.3    CBC Recent Labs  Lab 01/22/17 0746  WBC 8.2  HGB 13.6  HCT 40.7  PLT 241    Coag's No results for input(s): APTT, INR in the last 168 hours.  Sepsis Markers No results for input(s): LATICACIDVEN, PROCALCITON, O2SATVEN in the last 168 hours.  ABG Recent Labs  Lab 01/22/17 0949  PHART 7.279*  PCO2ART 67.2*  PO2ART 71.0*    Liver Enzymes No results for input(s): AST, ALT, ALKPHOS, BILITOT, ALBUMIN in the last 168 hours.  Cardiac Enzymes No results for input(s): TROPONINI, PROBNP in the last 168 hours.  Glucose Recent Labs   Lab 01/22/17 0936  GLUCAP 74    Imaging Dg Chest 2 View  Result Date: 01/22/2017 CLINICAL DATA:  Shortness of breath and chest pain EXAM: CHEST  2 VIEW COMPARISON:  March 06, 2016 FINDINGS: There is no edema or consolidation. The heart is upper normal in size with pulmonary vascularity within normal limits. There is aortic atherosclerosis. No adenopathy. There is evidence of old trauma involving the lateral right clavicle. There is degenerative change in the thoracic spine. IMPRESSION: No edema or consolidation. Stable cardiac silhouette. There is aortic atherosclerosis. Aortic Atherosclerosis (ICD10-I70.0). Electronically Signed   By: Lowella Grip III M.D.   On: 01/22/2017 08:57   Ct Head Wo Contrast  Result Date: 01/22/2017 CLINICAL DATA:  Altered level of consciousness. Patient is on methadone. EXAM: CT HEAD WITHOUT CONTRAST TECHNIQUE: Contiguous axial images were obtained from the base of the skull through the vertex without intravenous contrast. COMPARISON:  07/12/2015 FINDINGS: Brain: No evidence of acute infarction, hemorrhage, extra-axial collection, ventriculomegaly, or mass effect. Generalized cerebral atrophy. Periventricular white matter low attenuation likely secondary to microangiopathy. Vascular: Cerebrovascular atherosclerotic calcifications are noted. Skull: Negative for fracture or focal lesion. Sinuses/Orbits: Visualized portions of the orbits are  unremarkable. Visualized portions of the paranasal sinuses and mastoid air cells are unremarkable. Other: None. IMPRESSION: 1. No acute intracranial pathology. 2. Chronic microvascular disease and cerebral atrophy. Electronically Signed   By: Kathreen Devoid   On: 01/22/2017 10:56     STUDIES:  01/22/2017 CT of the head with no acute intracranial abnormalities  CULTURES: 01/22/2017 blood cultures x2>> 01/22/2017 urine>> 01/22/2017 sputum culture>>  ANTIBIOTICS: No antibiotics at this time  SIGNIFICANT EVENTS: 01/22/2017 intubated  for airway protection  LINES/TUBES: >>  DISCUSSION: 01/22/2017 endotracheal tube  ASSESSMENT / PLAN:  PULMONARY A: Chronic obstructive pulmonary disease with ongoing tobacco abuse 01/22/2017 altered mental status with inability to protect airway and ventilate P:   01/22/2017 intubated for airway protection Vent bundle Allison Vaughan is a 72 year old Bronchodilators as needed  CARDIOVASCULAR A:  History of hypertension P:  Hold oral antihypertensives at this time monitoring blood pressure intensive care setting   RENAL Lab Results  Component Value Date   CREATININE 1.26 (H) 01/22/2017   CREATININE 1.32 (H) 07/12/2015   CREATININE 0.95 05/02/2014    A:   Mild renal insufficiency P:   Monitor creatinine Gentle hydration  GASTROINTESTINAL A:   History gastroesophageal reflux  P:   Protein pump inhibitor  HEMATOLOGIC A:   DVT prophylaxis P:  PAS hose  INFECTIOUS A:   No overt evidence of infection P:   Monitor white count temperature curve  ENDOCRINE CBG (last 3)  Recent Labs    01/22/17 0936  GLUCAP 74    A:   Rule out hypo-or hyperglycemia P:   Sliding scale insulin  NEUROLOGIC A:   Presumed benzodiazepine and opiate withdrawal. 4 days ago started methadone at the methadone clinic to come off chronic opiate use. Family has been weaning her Xanax over the last 2 weeks. During workup in the ED became unresponsive unable to protect airway developed jerking myoclonic type movements and was intubated for airway protection Chronic mood disorder  P:   RASS goal: -1 Propofol for sedation. PRN benzodiazepines and narcotics   FAMILY  - Updates: No family at bedside at time of admission - Inter-disciplinary family meet or Palliative Care meeting due by:  day 7  App CCT 60 min  Richardson Landry Karelly Dewalt ACNP Maryanna Shape PCCM Pager [phone] till 1 pm If no answer page [phone] 01/22/2017, 11:25 AM

## 2017-01-23 ENCOUNTER — Encounter (HOSPITAL_COMMUNITY): Payer: Self-pay | Admitting: Anesthesiology

## 2017-01-23 DIAGNOSIS — F13239 Sedative, hypnotic or anxiolytic dependence with withdrawal, unspecified: Principal | ICD-10-CM

## 2017-01-23 DIAGNOSIS — E876 Hypokalemia: Secondary | ICD-10-CM

## 2017-01-23 LAB — BLOOD GAS, ARTERIAL
Acid-Base Excess: 4.1 mmol/L — ABNORMAL HIGH (ref 0.0–2.0)
Bicarbonate: 28.3 mmol/L — ABNORMAL HIGH (ref 20.0–28.0)
Drawn by: 511471
FIO2: 40
MECHVT: 340 mL
O2 Saturation: 99.5 %
PATIENT TEMPERATURE: 99.1
PCO2 ART: 44.5 mmHg (ref 32.0–48.0)
PEEP: 5 cmH2O
PO2 ART: 197 mmHg — AB (ref 83.0–108.0)
RATE: 20 resp/min
pH, Arterial: 7.421 (ref 7.350–7.450)

## 2017-01-23 LAB — BASIC METABOLIC PANEL
Anion gap: 8 (ref 5–15)
BUN: 8 mg/dL (ref 6–20)
CALCIUM: 8.3 mg/dL — AB (ref 8.9–10.3)
CHLORIDE: 102 mmol/L (ref 101–111)
CO2: 28 mmol/L (ref 22–32)
CREATININE: 0.99 mg/dL (ref 0.44–1.00)
GFR calc Af Amer: >60 mL/min (ref >60)
GFR calc non Af Amer: 55 mL/min — ABNORMAL LOW (ref >60)
GLUCOSE: 96 mg/dL (ref 65–99)
Potassium: 3.2 mmol/L — ABNORMAL LOW (ref 3.5–5.1)
Sodium: 138 mmol/L (ref 135–145)

## 2017-01-23 LAB — GLUCOSE, CAPILLARY
GLUCOSE-CAPILLARY: 110 mg/dL — AB (ref 65–99)
GLUCOSE-CAPILLARY: 164 mg/dL — AB (ref 65–99)
GLUCOSE-CAPILLARY: 68 mg/dL (ref 65–99)
GLUCOSE-CAPILLARY: 80 mg/dL (ref 65–99)
Glucose-Capillary: 63 mg/dL — ABNORMAL LOW (ref 65–99)
Glucose-Capillary: 85 mg/dL (ref 65–99)
Glucose-Capillary: 263 mg/dL — ABNORMAL HIGH (ref 65–99)
Glucose-Capillary: 71 mg/dL (ref 65–99)

## 2017-01-23 LAB — CBC
HEMATOCRIT: 38.2 % (ref 36.0–46.0)
Hemoglobin: 12.4 g/dL (ref 12.0–15.0)
MCH: 29.1 pg (ref 26.0–34.0)
MCHC: 32.5 g/dL (ref 30.0–36.0)
MCV: 89.7 fL (ref 78.0–100.0)
Platelets: 153 10*3/uL (ref 150–400)
RBC: 4.26 MIL/uL (ref 3.87–5.11)
RDW: 13.1 % (ref 11.5–15.5)
WBC: 6 10*3/uL (ref 4.0–10.5)

## 2017-01-23 LAB — TROPONIN I
TROPONIN I: 0.06 ng/mL — AB (ref <0.03)
Troponin I: <0.03 ng/mL (ref <0.03)
Troponin I: <0.03 ng/mL (ref <0.03)

## 2017-01-23 LAB — URINE CULTURE
Culture: NO GROWTH
Special Requests: NORMAL

## 2017-01-23 LAB — MRSA PCR SCREENING: MRSA BY PCR: NEGATIVE

## 2017-01-23 LAB — STREP PNEUMONIAE URINARY ANTIGEN: Strep Pneumo Urinary Antigen: NEGATIVE

## 2017-01-23 LAB — TRIGLYCERIDES: TRIGLYCERIDES: 118 mg/dL (ref <150)

## 2017-01-23 MED ORDER — ACETAMINOPHEN 325 MG PO TABS
650.0000 mg | ORAL_TABLET | ORAL | Status: DC | PRN
  Administered 2017-01-23 (×2): 650 mg via ORAL
  Filled 2017-01-23 (×2): qty 2

## 2017-01-23 MED ORDER — DEXTROSE 50 % IV SOLN
INTRAVENOUS | Status: AC
  Administered 2017-01-23: 25 mL via INTRAVENOUS
  Filled 2017-01-23: qty 50

## 2017-01-23 MED ORDER — POTASSIUM CHLORIDE CRYS ER 20 MEQ PO TBCR
40.0000 meq | EXTENDED_RELEASE_TABLET | Freq: Three times a day (TID) | ORAL | Status: DC
  Administered 2017-01-23: 40 meq via ORAL
  Filled 2017-01-23: qty 2

## 2017-01-23 MED ORDER — VITAL HIGH PROTEIN PO LIQD: 1000.0000 mL | ORAL | Status: DC

## 2017-01-23 MED ORDER — DEXTROSE 50 % IV SOLN
25.0000 mL | Freq: Once | INTRAVENOUS | Status: AC
  Administered 2017-01-23: 25 mL via INTRAVENOUS

## 2017-01-23 MED ORDER — DEXTROSE 50 % IV SOLN
INTRAVENOUS | Status: AC
  Administered 2017-01-23: 50 mL
  Filled 2017-01-23: qty 50

## 2017-01-23 MED ORDER — DIPHENHYDRAMINE HCL 50 MG/ML IJ SOLN
50.0000 mg | Freq: Every evening | INTRAMUSCULAR | Status: DC | PRN
  Administered 2017-01-23: 50 mg via INTRAVENOUS
  Filled 2017-01-23: qty 1

## 2017-01-23 MED ORDER — POTASSIUM CHLORIDE 20 MEQ/15ML (10%) PO SOLN
40.0000 meq | Freq: Three times a day (TID) | ORAL | Status: DC
  Administered 2017-01-23: 40 meq
  Filled 2017-01-23 (×3): qty 30

## 2017-01-23 MED ORDER — PANTOPRAZOLE SODIUM 40 MG PO TBEC
40.0000 mg | DELAYED_RELEASE_TABLET | Freq: Every day | ORAL | Status: DC
  Administered 2017-01-23 – 2017-01-24 (×2): 40 mg via ORAL
  Filled 2017-01-23 (×2): qty 1

## 2017-01-23 MED ORDER — FUROSEMIDE 10 MG/ML IJ SOLN
40.0000 mg | Freq: Three times a day (TID) | INTRAMUSCULAR | Status: AC
  Administered 2017-01-23 (×2): 40 mg via INTRAVENOUS
  Filled 2017-01-23 (×2): qty 4

## 2017-01-23 MED ORDER — PNEUMOCOCCAL VAC POLYVALENT 25 MCG/0.5ML IJ INJ
0.5000 mL | INJECTION | INTRAMUSCULAR | Status: AC
  Administered 2017-01-24: 0.5 mL via INTRAMUSCULAR
  Filled 2017-01-23: qty 0.5

## 2017-01-23 MED ORDER — POTASSIUM CHLORIDE 20 MEQ/15ML (10%) PO SOLN
30.0000 meq | ORAL | Status: AC
  Administered 2017-01-23 (×2): 30 meq
  Filled 2017-01-23 (×2): qty 30

## 2017-01-23 MED ORDER — INFLUENZA VAC SPLIT HIGH-DOSE 0.5 ML IM SUSY
0.5000 mL | PREFILLED_SYRINGE | INTRAMUSCULAR | Status: AC
  Administered 2017-01-24: 0.5 mL via INTRAMUSCULAR
  Filled 2017-01-23: qty 0.5

## 2017-01-23 NOTE — Progress Notes (Signed)
[facility] ADULT ICU REPLACEMENT PROTOCOL FOR AM LAB REPLACEMENT ONLY  The patient does apply for the [facility] Adult ICU Electrolyte Replacment Protocol based on the criteria listed below:   1. Is GFR >/= 40 ml/min? Yes.    Patient's GFR today is >60 2. Is urine output >/= 0.5 ml/kg/hr for the last 6 hours? Yes.   Patient's UOP is 2 ml/kg/hr 3. Is BUN < 60 mg/dL? Yes.    Patient's BUN today is 8 4. Abnormal electrolyte(s): 3.2 5. Ordered repletion with: per protocol 6. If a panic level lab has been reported, has the CCM MD in charge been notified? Yes.  .   Physician:  Dr. Gray Bernhardt, Philis Nettle 01/23/2017 5:17 AM

## 2017-01-23 NOTE — Progress Notes (Signed)
Name: Allison Vaughan MRN: [id number] DOB: Dec 07, 1945  DOS:    LOS: 1  CRITICAL CARE PROGRESS NOTE  Brief Summary: Allison Vaughan is a 72 year old female with a known history of chronic opiate use, benzodiazepine use, who is been weaned off Xanax over the last 2 weeks by her family and has been using the methadone clinic to come off chronic opioids over the last 4 days.  She presented to [facility] emergency department on 01/22/2017 with confusion difficulty breathing. At the time of admission she is awake alert following commands and joking.  After she returned from CT scan of the head she became poorly responsive unable to protect her airway and demonstrated jerking myoclonic type movements of her extremities. She was intubated urgently for poor airway protection and altered mental status. Admitted to the intensive care unit on full mechanical ventilatory support to the pulmonary critical care service.  Subjective: Currently intubated. No history could be obtained from the patient.   Lines / Drains: NGT 1/9 Urethral catheter 1/9 ET tube 1/9  Cultures / Sepsis markers: Resp 1/9 > pending Blood 1/9 > pending Urine 1/9 > pending  Antibiotics:  Tests / Events: CXR 1/9 > No edema or consolidation. Stable cardiac silhouette. CT head 1/9 > No acute intracranial pathology. CXR 1/9 > The endotracheal tube is in reasonable position 2 cm above the carina. The lungs are well-expanded. No pneumonia nor CHF. Abd x-ray 1/9 > Nasogastric tube tip projecting in gastric antrum. Focal gas distended small bowel LEFT abdomen seen with focal ileus, less likely early versus partial small bowel obstruction.  Vital Signs:  Reviewed  Physical Examination: Neuro: Intubated. Awake and alert. Following commands. Strength 5/5 in bilateral upper and lower extremities. Sensation to light touch intact throughout. Bilateral hand tremor noted.  HEENT:  ETT/OGT. PERRLA.  Heart:  RRR, no M/R/G Lungs:  Bilateral air  entry, no W/R/R Abdomen:  Soft, non tender, bowel sounds present Extremities: No edema  Labs and Imaging:  Reviewed.  Please refer to the Assessment and Plan section for relevant results.  Assessment and Plan: 72 year old female on chronic opiate, benzodiazepine use, and methadone clinic admitted with altered mental status, intubated for respiratory protection.  PULMONARY A: Chronic obstructive pulmonary disease with ongoing tobacco abuse 01/22/2017 altered mental status with inability to protect airway  - intubated on mechanical ventilation  P:   Currently on weaning trial, pressure support mode PEEP 5, pressure over PEEP 5, FiO2 40%. Bronchodilators as needed  CARDIOVASCULAR A:  History of hypertension P:  Hold oral antihypertensives at this time monitoring blood pressure intensive care setting   RENAL Labs(Brief)       Lab Results  Component Value Date   CREATININE 1.26 (H) 01/22/2017   CREATININE 1.32 (H) 07/12/2015   CREATININE 0.95 05/02/2014     A:   Mild renal insufficiency - resolved  P:   Gentle hydration  GASTROINTESTINAL A:   History gastroesophageal reflux  P:   Protein pump inhibitor  HEMATOLOGIC A:   DVT prophylaxis P:  SCDs  INFECTIOUS A:   No overt evidence of infection - afebrile and no leukocytosis  P:   Continue to monitor white count and temperature curve  ENDOCRINE CBG (last 3)  RecentLabs(last2labs)  Recent Labs    01/22/17 0936  GLUCAP 74     A:   Rule out hypo-or hyperglycemia - CBG 63 this morning  P:   Sliding scale insulin  NEUROLOGIC A:   Acute encephalopathy - unclear etiology  of AMS. EEG showing moderate diffuse background slowing, non-specific finding.  Presumed benzodiazepine and opiate withdrawal. 4 days ago started methadone at the methadone clinic to come off chronic opiate use. Family has been weaning her Xanax over the last 2 weeks. During workup in the ED became unresponsive unable to  protect airway developed jerking myoclonic type movements and was intubated for airway protection Chronic mood disorder  P:   RASS goal: 0 PRN benzodiazepines and narcotics Propofol drip  FAMILY  - Updates: No family at bedside at time of admission - Inter-disciplinary family meet or Palliative Care meeting due by:  day 7   Shela Leff, MD PGY3 - IMTS  Attending Note:  72 year old female with benzos and narc abuse, in the methadone clinic that presents with AMS.  On exam, patient is alert and interactive on low dose propofol.  Lungs are with coarse BS diffusely.  I reviewed CXR myself, ETT is in good position.  Procalcitonin noted.  Troponin rising.  Will begin PS trials.  Adjust vent for ABG.  Hold off extubation for now.  Continue sedation but minimize as able.  Continue TF.  Diureses as ordered.  K added.  Will re-evaluate in the afternoon for ?extubation.  The patient is critically ill with multiple organ systems failure and requires high complexity decision making for assessment and support, frequent evaluation and titration of therapies, application of advanced monitoring technologies and extensive interpretation of multiple databases.   Critical Care Time devoted to patient care services described in this note is  35  Minutes. This time reflects time of care of this signee Dr Jennet Maduro. This critical care time does not reflect procedure time, or teaching time or supervisory time of PA/NP/Med student/Med Resident etc but could involve care discussion time.  Rush Farmer, M.D. [facility] Pulmonary/Critical Care Medicine. Pager: [phone]. After hours pager: [phone].

## 2017-01-23 NOTE — Progress Notes (Signed)
Called eLink regarding: 1) patients BP 160's/80's, IV fluids running at 100 ml/hr. Patient able to take PO's well. Patient also getting IV Lasix 40 mg. 2) X-ray from 01/22/17 2230 showed an ileus or SBO, patient states she hasn't had a BM in 5 days. Was taking methadone and benzos at home.  Ordered a decrease in IV fluids to 45ml/hr. Will continue to monitor closely.

## 2017-01-23 NOTE — Progress Notes (Addendum)
Initial Nutrition Assessment  DOCUMENTATION CODES:   Not applicable  INTERVENTION:    Vital High Protein at 40 ml/h (960 ml per day)   Provides 960 kcal (1153 kcal total with Propofol), 84 gm protein, 803 ml free water daily  NUTRITION DIAGNOSIS:   Inadequate oral intake related to inability to eat as evidenced by NPO status.  GOAL:   Patient will meet greater than or equal to 90% of their needs  MONITOR:   Vent status, TF tolerance, Labs, I & O's  REASON FOR ASSESSMENT:   Ventilator, Consult Enteral/tube feeding initiation and management  ASSESSMENT:   72 yo female with PMH of chronic opiate and benzodiazepine abuse, chronic pain, COPD, osteoarthritis, IBS, GERD, HTN, HLD who was admitted on 1/9 with AMS, intubated for respiratory protection.  Patient is currently intubated on ventilator support MV: 8 L/min Temp (24hrs), Avg:98.9 F (37.2 C), Min:98.8 F (37.1 C), Max:99.2 F (37.3 C)  Propofol: 7.3 ml/hr providing 193 kcal from lipid. Labs reviewed. Potassium 3.2 (L) Medications reviewed and include folic acid, thiamine, propofol, KCl.  Patient is at increased nutrition risk, given mild depletion of muscle mass and 15% weight loss within the past 15 months. Suspect malnutrition; need additional intake and weight history.  NUTRITION - FOCUSED PHYSICAL EXAM:    Most Recent Value  Orbital Region  No depletion  Upper Arm Region  No depletion  Thoracic and Lumbar Region  Unable to assess  Buccal Region  Unable to assess  Temple Region  Mild depletion  Clavicle Bone Region  Mild depletion  Clavicle and Acromion Bone Region  Mild depletion  Scapular Bone Region  Unable to assess  Dorsal Hand  Mild depletion  Patellar Region  Mild depletion  Anterior Thigh Region  Mild depletion  Posterior Calf Region  Mild depletion  Edema (RD Assessment)  None  Hair  Reviewed  Eyes  Unable to assess  Mouth  Unable to assess  Skin  Reviewed  Nails  Reviewed        Diet Order:  Diet NPO time specified  EDUCATION NEEDS:   No education needs have been identified at this time  Skin:  Skin Assessment: Reviewed RN Assessment  Last BM:  PTA  Height:   Ht Readings from Last 1 Encounters:  01/22/17 4\' 11"  (1.499 m)    Weight:   Wt Readings from Last 1 Encounters:  01/23/17 105 lb 9.6 oz (47.9 kg)    Ideal Body Weight:  44.7 kg  BMI:  Body mass index is 21.33 kg/m.  Estimated Nutritional Needs:   Kcal:  1125  Protein:  70-80 gm  Fluid:  >/= 1.2 L   Molli Barrows, RD, LDN, [place] Pager [phone] After Hours Pager [phone]

## 2017-01-23 NOTE — Progress Notes (Signed)
Hypoglycemic Event  CBG: 68  Treatment: D50 IV 50 mL  Symptoms: None  Follow-up CBG: 164  Time: CBG Result: 0107  Possible Reasons for Event: Inadequate meal intake  Allison Vaughan

## 2017-01-23 NOTE — Progress Notes (Signed)
Called ELink d/t patient requesting something to sleep. Patient reports she takes Aleve PM at home. MD ordered Ashley Jacobs

## 2017-01-23 NOTE — Care Management Note (Signed)
Case Management Note Marvetta Gibbons RN, BSN Unit 4E-Case Manager-- [place] coverage [phone]  Patient Details  Name: Allison Vaughan MRN: [id number] Date of Birth: 03-Apr-1945  Subjective/Objective:  Pt admitted with AMS- resp. Failure- intubated for airway protection- per MD note- history of chronic opiate use, benzodiazepine use, who is been weaned off Xanax over the last 2 weeks by her family and has been using the methadone clinic to come off chronic opioids over the last 4 days.                  Action/Plan: PTA Pt lived at home with family- CM to follow for transition of care needs.   Expected Discharge Date:                  Expected Discharge Plan:     In-House Referral:     Discharge planning Services  CM Consult  Post Acute Care Choice:    Choice offered to:     DME Arranged:    DME Agency:     HH Arranged:    HH Agency:     Status of Service:  In process, will continue to follow  If discussed at Long Length of Stay Meetings, dates discussed:    Discharge Disposition:   Additional Comments:  Dawayne Patricia, RN 01/23/2017, 10:30 AM

## 2017-01-23 NOTE — Progress Notes (Signed)
PT Cancellation Note  Patient Details Name: LORELLA GOMEZ MRN: [id number] DOB: 1945-04-11   Cancelled Treatment:    Reason Eval/Treat Not Completed: Other (comment). Pt extubated this afternoon. Will follow-up for PT evaluation as time allows.  Mabeline Caras, PT, DPT Acute Rehab Services  Pager: [place] 01/23/2017, 1:23 PM

## 2017-01-23 NOTE — Procedures (Signed)
Extubation Procedure Note  Patient Details:   Name: Allison Vaughan DOB: [birth year] MRN: [id number]   Airway Documentation:     Evaluation  O2 sats: stable throughout Complications: No apparent complications Patient did tolerate procedure well. Bilateral Breath Sounds: Clear, Diminished   Yes   Patient tolerated wean. Positive for cuff leak. Patient extubated to a 4 Lpm nasal cannula. No signs of dyspnea or stridor noted. Patient resting comfortably. RN at bedside.   Myrtie Neither 01/23/2017, 1:08 PM

## 2017-01-24 ENCOUNTER — Inpatient Hospital Stay (HOSPITAL_COMMUNITY): Payer: Medicare HMO

## 2017-01-24 DIAGNOSIS — I10 Essential (primary) hypertension: Secondary | ICD-10-CM

## 2017-01-24 DIAGNOSIS — Z23 Encounter for immunization: Secondary | ICD-10-CM | POA: Diagnosis not present

## 2017-01-24 DIAGNOSIS — T50901A Poisoning by unspecified drugs, medicaments and biological substances, accidental (unintentional), initial encounter: Secondary | ICD-10-CM

## 2017-01-24 LAB — MAGNESIUM: MAGNESIUM: 1.6 mg/dL — AB (ref 1.7–2.4)

## 2017-01-24 LAB — BLOOD GAS, ARTERIAL
Acid-Base Excess: 7.7 mmol/L — ABNORMAL HIGH (ref 0.0–2.0)
BICARBONATE: 30.8 mmol/L — AB (ref 20.0–28.0)
DRAWN BY: 51702
O2 Saturation: 96.9 %
PATIENT TEMPERATURE: 98.6
PH ART: 7.542 — AB (ref 7.350–7.450)
pCO2 arterial: 36 mmHg (ref 32.0–48.0)
pO2, Arterial: 75.6 mmHg — ABNORMAL LOW (ref 83.0–108.0)

## 2017-01-24 LAB — CBC
HCT: 40.3 % (ref 36.0–46.0)
HEMOGLOBIN: 13.6 g/dL (ref 12.0–15.0)
MCH: 30.3 pg (ref 26.0–34.0)
MCHC: 33.7 g/dL (ref 30.0–36.0)
MCV: 89.8 fL (ref 78.0–100.0)
PLATELETS: 160 10*3/uL (ref 150–400)
RBC: 4.49 MIL/uL (ref 3.87–5.11)
RDW: 13.5 % (ref 11.5–15.5)
WBC: 6.3 10*3/uL (ref 4.0–10.5)

## 2017-01-24 LAB — GLUCOSE, CAPILLARY
GLUCOSE-CAPILLARY: 109 mg/dL — AB (ref 65–99)
Glucose-Capillary: 93 mg/dL (ref 65–99)
Glucose-Capillary: 87 mg/dL (ref 65–99)

## 2017-01-24 LAB — BASIC METABOLIC PANEL
ANION GAP: 10 (ref 5–15)
BUN: 10 mg/dL (ref 6–20)
CO2: 31 mmol/L (ref 22–32)
CREATININE: 0.91 mg/dL (ref 0.44–1.00)
Calcium: 8.3 mg/dL — ABNORMAL LOW (ref 8.9–10.3)
Chloride: 94 mmol/L — ABNORMAL LOW (ref 101–111)
Glucose, Bld: 111 mg/dL — ABNORMAL HIGH (ref 65–99)
Potassium: 3.6 mmol/L (ref 3.5–5.1)
SODIUM: 135 mmol/L (ref 135–145)

## 2017-01-24 LAB — PHOSPHORUS: PHOSPHORUS: 2.4 mg/dL — AB (ref 2.5–4.6)

## 2017-01-24 LAB — TROPONIN I

## 2017-01-24 MED ORDER — POTASSIUM CHLORIDE CRYS ER 20 MEQ PO TBCR
40.0000 meq | EXTENDED_RELEASE_TABLET | Freq: Once | ORAL | Status: AC
  Administered 2017-01-24: 40 meq via ORAL
  Filled 2017-01-24: qty 2

## 2017-01-24 MED ORDER — BLISTEX MEDICATED EX OINT
TOPICAL_OINTMENT | CUTANEOUS | Status: DC | PRN
  Filled 2017-01-24: qty 6.3

## 2017-01-24 NOTE — Progress Notes (Signed)
Name: Allison Vaughan MRN: [id number] DOB: 03/11/1945  DOS:    LOS: 2  CRITICAL CARE PROGRESS NOTE  Brief Summary: Allison Vaughan is a 72 year old female with a known history of chronic opiate use, benzodiazepine use, who is been weaned off Xanax over the last 2 weeks by her family and has been using the methadone clinic to come off chronic opioids over the last 4 days.  She presented to [facility] emergency department on 01/22/2017 with confusion difficulty breathing. At the time of admission she is awake alert following commands and joking.  After she returned from CT scan of the head she became poorly responsive unable to protect her airway and demonstrated jerking myoclonic type movements of her extremities. She was intubated urgently for poor airway protection and altered mental status. Admitted to the intensive care unit on full mechanical ventilatory support to the pulmonary critical care service.  Subjective: Extubated on 1/10, multiple complaints of diffuse pain, anxiety, insomnia...etc.  Lines / Drains: NGT 1/9 Urethral catheter 1/9 ET tube 1/9 ETT 1/9-1/10  Cultures / Sepsis markers: Resp 1/9 > pending Blood 1/9 > pending Urine 1/9 > pending  Antibiotics:  Tests / Events: CXR 1/9 > No edema or consolidation. Stable cardiac silhouette. CT head 1/9 > No acute intracranial pathology. CXR 1/9 > The endotracheal tube is in reasonable position 2 cm above the carina. The lungs are well-expanded. No pneumonia nor CHF. Abd x-ray 1/9 > Nasogastric tube tip projecting in gastric antrum. Focal gas distended small bowel LEFT abdomen seen with focal ileus, less likely early versus partial small bowel obstruction.  Vital Signs:  Reviewed  Physical Examination: Neuro: Extubated, ambulating, in the bathroom HEENT:  [place]/AT, PERRL, EOM-I and MMM Heart:  RRR, Nl S1/S2 and -M/R/G. Lungs:  CTA bilaterally Abdomen:  Soft, NT, mild distension and +BS Extremities: No edema  Labs and Imaging:   Reviewed.  Please refer to the Assessment and Plan section for relevant results.  Assessment and Plan: 72 year old female on chronic opiate, benzodiazepine use, and methadone clinic admitted with altered mental status, intubated for respiratory protection.  I reviewed CXR myself, no acute disease noted.  Discussed with PCCM-NP and bedside RN.  PULMONARY A: Chronic obstructive pulmonary disease with ongoing tobacco abuse 72 year old female on chronic opiate, benzodiazepine use, and methadone clinic admitted with altered mental status, intubated for respiratory protection. with inability to protect airway  - intubated on mechanical ventilation  P:   Titrate O2 to off  CARDIOVASCULAR A:  History of hypertension P:  Hold oral antihypertensives at this time monitoring blood pressure intensive care setting  RENAL Labs(Brief)       Lab Results  Component Value Date   CREATININE 1.26 (H) 01/22/2017   CREATININE 1.32 (H) 07/12/2015   CREATININE 0.95 05/02/2014     A:   Mild renal insufficiency - resolved  P:   KVO IVF  GASTROINTESTINAL A:   History gastroesophageal reflux  P:   Protein pump inhibitor  HEMATOLOGIC A:   DVT prophylaxis P:  SCDs  INFECTIOUS A:   No overt evidence of infection - afebrile and no leukocytosis  P:   Continue to monitor white count and temperature curve  ENDOCRINE CBG (last 3)  RecentLabs(last2labs)     Recent Labs    01/22/17 0936  GLUCAP 74     A:   Rule out hypo-or hyperglycemia - CBG 63 this morning  P:   Sliding scale insulin  NEUROLOGIC A:   Acute encephalopathy - unclear etiology of AMS. EEG showing moderate diffuse background slowing, non-specific finding.  Presumed benzodiazepine and  opiate withdrawal. 4 days ago started methadone at the methadone clinic to come off chronic opiate use. Family has been weaning her Xanax over the last 2 weeks. During workup in the ED became unresponsive unable to protect airway developed jerking myoclonic type movements and was intubated for airway protection Chronic mood disorder  P:    RASS goal: 0 PRN benzodiazepines and narcotics Propofol drip  FAMILY  - Updates: No family at bedside at time of admission - Inter-disciplinary family meet or Palliative Care meeting due by:  day 7   Shela Leff, MD PGY3 - IMTS  Attending Note:  72 year old female with benzos and narc abuse, in the methadone clinic that presents with AMS.  On exam, patient is alert and interactive on low dose propofol.  Lungs are with coarse BS diffusely.  I reviewed CXR myself, ETT is in good position.  Procalcitonin noted.  Troponin rising.  Will begin PS trials.  Adjust vent for ABG.  Hold off extubation for now.  Continue sedation but minimize as able.  Continue TF.  Diureses as ordered.  K added.  Will re-evaluate in the afternoon for ?extubation.  The patient is critically ill with multiple organ systems failure and requires high complexity decision making for assessment and support, frequent evaluation and titration of therapies, application of advanced monitoring technologies and extensive interpretation of multiple databases.   Critical Care Time devoted to patient care services described in this note is  35  Minutes. This time reflects time of care of this signee Dr Jennet Maduro. This critical care time does not reflect procedure time, or teaching time or supervisory time of PA/NP/Med student/Med Resident etc but could involve care discussion time.  Rush Farmer, M.D. [facility] Pulmonary/Critical Care Medicine. Pager: [phone]. After hours pager: [phone].

## 2017-01-24 NOTE — Progress Notes (Signed)
PT Cancellation Note  Patient Details Name: Allison Vaughan MRN: [id number] DOB: October 28, 1945   Cancelled Treatment:    Reason Eval/Treat Not Completed: Other (comment). Pt currently getting paperwork from RN to prepare for d/c. Pt has no further questions or concerns for PT. Will d/c order.  Mabeline Caras, PT, DPT Acute Rehab Services  Pager: [phone] 01/24/2017, 11:27 AM

## 2017-01-24 NOTE — Discharge Summary (Signed)
Physician Discharge Summary       Patient ID: Allison Vaughan MRN: [id number] DOB/AGE: 07-16-1945 72 y.o.  Admit date: 01/22/2017 Discharge date: 01/24/2017  Discharge Diagnoses: Acute on Chronic Respiratory Failure 2/2 AMS and inability to protect airway ( Presumed benzodiazepine and opiate withdrawal.)    Detailed Hospital Course:  Allison Vaughan is a 72 year old female with a known history of chronic opiate use, benzodiazepine use, who is been weaned off Xanax over the last 2 weeks by her family and has been using the methadone clinic to come off chronic opioids over the last 4 days.  She presented to [facility] emergency department on 01/22/2017 with confusion difficulty breathing. At the time of admission she is awake alert following commands and joking.  After she returned from CT scan of the head she became poorly responsive unable to protect her airway and demonstrated jerking myoclonic type movements of her extremities. She was intubated urgently for poor airway protection and altered mental status. Admitted to the intensive care unit on full mechanical ventilatory support to the pulmonary critical care service.She was extubated 1/10,  she is protecting her airway, following commands. She is on RA and ready for discharge 1/11.     Discharge Plan by active problems  PULMONARY A: Chronic obstructive pulmonary disease with ongoing tobacco abuse 01/22/2017 altered mental status with inability to protect airway  - intubated on mechanical ventilation  P: Extubated 1/10 Weaned off oxygen 1/10 Resume pre-admission  home nebulizer and inhalers  CARDIOVASCULAR A: History of hypertension BP well controlled as inpatient P: Resume pre-admission medications  RENAL A: Mild renal insufficiency - resolved  P: IVF discontinued Taking PO's well Good urine output Creatinine down trending to 0.91 at discharge  GASTROINTESTINAL A: History gastroesophageal  reflux P: Continue pre admission GERD treatment  HEMATOLOGIC A: No Acute issues DVT prophylaxis P: Normal Activity  INFECTIOUS A: No overt evidence of infection - afebrile and no leukocytosis  P: Afebrile at discharge Follow up with PCP for any fever after discharge  ENDOCRINE Hypoglycemic event x 1 this admission Taking PO's well CBG's WNL at discharge    P:   Pt. Instructed to eat normal meals  NEUROLOGIC A: Acute encephalopathy - unclear etiology of AMS. EEG showing moderate diffuse background slowing, non-specific finding. >> Resolved Presumed benzodiazepine and opiate withdrawal. 4 days ago started methadone at the methadone clinic to come off chronic opiate use. Anxiety Chronic Pain No suicidal ideation  P: Report to Methadone Clinic for any S/S of withdrawal/ for pain management    Significant Hospital tests/ studies  Tests / Events: Lines / Drains: NGT 1/9 Urethral catheter 1/9 ET tube 1/9 ETT 1/9-1/10  Cultures / Sepsis markers: Resp 1/9 > pending Blood 1/9 > pending Urine 1/9 > pending  CXR 1/9 > No edema or consolidation. Stable cardiac silhouette. CT head 1/9 > No acute intracranial pathology. CXR 1/9 > The endotracheal tube is in reasonable position 2 cm above the carina. The lungs are well-expanded. No pneumonia nor CHF. Abd x-ray 1/9 > Nasogastric tube tip projecting in gastric antrum. Focal gas distended small bowel LEFT abdomen seen with focal ileus, less likely early versus partial small bowel obstruction     Consults  Neuro>> EEG Impression: This sedated EEG is abnormal due to moderate diffuse background slowing.  Clinical Correlation of the above findings indicates diffuse cerebral dysfunction that is non-specific in etiology and can be seen with hypoxic/ischemic injury, toxic/metabolic encephalopathies, or medication effect. Body jerking and tremulousness did  not show any epileptiform correlate.  The absence of  epileptiform discharges does not rule out a clinical diagnosis of epilepsy.  Clinical correlation is advised.   Discharge Exam: BP (!) 151/73   Pulse 65   Temp 98.7 F (37.1 C) (Oral)   Resp 19   Ht 4\' 11"  (1.499 m)   Wt 97 lb 4.8 oz (44.1 kg)   SpO2 98%   BMI 19.65 kg/m   Physical Exam:  General- No distress,  A&Ox3, on RA ENT: No sinus tenderness, TM clear, pale nasal mucosa, no oral exudate,no post nasal drip, no LAN Cardiac: S1, S2, regular rate and rhythm, no murmur Chest: Few exp wheeze/ No rales/ dullness; no accessory muscle use, no nasal flaring, no sternal retractions Abd.: Soft Non-tender, non-distended, BS + Ext: No clubbing cyanosis, edema Neuro:  normal strength Skin: No rashes, warm and dry Psych: normal mood and behavior, anxious   Labs at discharge Lab Results  Component Value Date   CREATININE 0.91 01/24/2017   BUN 10 01/24/2017   NA 135 01/24/2017   K 3.6 01/24/2017   CL 94 (L) 01/24/2017   CO2 31 01/24/2017   Lab Results  Component Value Date   WBC 6.3 01/24/2017   HGB 13.6 01/24/2017   HCT 40.3 01/24/2017   MCV 89.8 01/24/2017   PLT 160 01/24/2017   Lab Results  Component Value Date   ALT 7 (L) 01/22/2017   AST 15 01/22/2017   ALKPHOS 68 01/22/2017   BILITOT 0.7 01/22/2017   Lab Results  Component Value Date   INR 1.02 01/22/2017   INR 1.07 05/01/2014    Current radiology studies Dg Abd 1 View  Result Date: 01/22/2017 CLINICAL DATA:  Evaluate orogastric tube placement. EXAM: ABDOMEN - 1 VIEW COMPARISON:  CT abdomen and pelvis April 15, 2014 FINDINGS: Nasogastric tube distal tip projects in gastric antrum. Single loop of gas distended small bowel LEFT mid abdomen measuring 3.3 cm. Gas within large bowel to the level of the rectum. Mild volume retained large bowel stool. No intra-mass effect or pathologic calcifications. Vascular calcifications noted. Limited assessment for free air on supine view. Soft tissue planes and included osseous  structures are nonsuspicious. IMPRESSION: Nasogastric tube tip projecting in gastric antrum. Focal gas distended small bowel LEFT abdomen seen with focal ileus, less likely early versus partial small bowel obstruction. Electronically Signed   By: Elon Alas M.D.   On: 01/22/2017 22:42   Ct Head Wo Contrast  Result Date: 01/22/2017 CLINICAL DATA:  Altered level of consciousness. Patient is on methadone. EXAM: CT HEAD WITHOUT CONTRAST TECHNIQUE: Contiguous axial images were obtained from the base of the skull through the vertex without intravenous contrast. COMPARISON:  07/12/2015 FINDINGS: Brain: No evidence of acute infarction, hemorrhage, extra-axial collection, ventriculomegaly, or mass effect. Generalized cerebral atrophy. Periventricular white matter low attenuation likely secondary to microangiopathy. Vascular: Cerebrovascular atherosclerotic calcifications are noted. Skull: Negative for fracture or focal lesion. Sinuses/Orbits: Visualized portions of the orbits are unremarkable. Visualized portions of the paranasal sinuses and mastoid air cells are unremarkable. Other: None. IMPRESSION: 1. No acute intracranial pathology. 2. Chronic microvascular disease and cerebral atrophy. Electronically Signed   By: Kathreen Devoid   On: 01/22/2017 10:56   Dg Chest Port 1 View  Result Date: 01/24/2017 CLINICAL DATA:  Extubated.  Some shortness of breath. EXAM: PORTABLE CHEST 1 VIEW COMPARISON:  01/22/2017 FINDINGS: Heart size is normal. Aortic atherosclerosis. Endotracheal tube and nasogastric tube have been removed. The vascularity is normal.  Lungs are clear. No effusions. No acute bone finding. IMPRESSION: Endotracheal tube and nasogastric tube removed.  No active disease . Electronically Signed   By: Nelson Chimes M.D.   On: 01/24/2017 09:19   Dg Chest Port 1 View  Result Date: 01/22/2017 CLINICAL DATA:  Status post intubation EXAM: PORTABLE CHEST 1 VIEW COMPARISON:  Preintubation study of today's date at  8:23 a.m. FINDINGS: The endotracheal tube tip projects 2 cm above the carina. The lungs are well-expanded. There is no focal infiltrate. There is no pleural effusion or pneumothorax. The heart and pulmonary vascularity are normal. There calcification in the wall of the aortic arch. IMPRESSION: The endotracheal tube is in reasonable position 2 cm above the carina. The lungs are well-expanded. No pneumonia nor CHF. Thoracic aortic atherosclerosis. Electronically Signed   By: David  Martinique M.D.   On: 01/22/2017 11:58    Disposition:  01-Home or Self Care  Discharge Instructions    Call MD for:  difficulty breathing, headache or visual disturbances   Complete by:  As directed    Call MD for:  persistant dizziness or light-headedness   Complete by:  As directed    Call MD for:  persistant nausea and vomiting   Complete by:  As directed    Call MD for:  severe uncontrolled pain   Complete by:  As directed    Call MD for:  temperature >100.4   Complete by:  As directed    Diet - low sodium heart healthy   Complete by:  As directed    Discharge instructions   Complete by:  As directed    If you start experiencing withdrawal symptoms  , please seek care at the Methadone Clinic.   Increase activity slowly   Complete by:  As directed      Allergies as of 01/24/2017   No Known Allergies     Medication List    STOP taking these medications   doxycycline 100 MG capsule Commonly known as:  VIBRAMYCIN   hydrochlorothiazide 25 MG tablet Commonly known as:  HYDRODIURIL     TAKE these medications   albuterol 108 (90 Base) MCG/ACT inhaler Commonly known as:  PROVENTIL HFA;VENTOLIN HFA Inhale 2 puffs into the lungs every 6 (six) hours as needed for wheezing.   albuterol (2.5 MG/3ML) 0.083% nebulizer solution Commonly known as:  PROVENTIL Take 3 mLs (2.5 mg total) by nebulization every 2 (two) hours as needed for wheezing.   amLODipine 10 MG tablet Commonly known as:  NORVASC Take 1  tablet (10 mg total) by mouth daily.   budesonide 0.25 MG/2ML nebulizer solution Commonly known as:  PULMICORT Take 2 mLs (0.25 mg total) by nebulization 2 (two) times daily.   FLUoxetine 40 MG capsule Commonly known as:  PROZAC Take 40 mg by mouth every morning.   guaiFENesin 600 MG 12 hr tablet Commonly known as:  MUCINEX Take 1,200 mg by mouth 2 (two) times daily as needed for congestion.   ipratropium 0.02 % nebulizer solution Commonly known as:  ATROVENT Take 2.5 mLs (0.5 mg total) by nebulization 4 (four) times daily.   methadone 1 MG/1ML solution Commonly known as:  DOLOPHINE Take 30 mg by mouth daily. [facility].   omeprazole 40 MG capsule Commonly known as:  PRILOSEC Take 1 capsule (40 mg total) by mouth daily.   potassium chloride SA 20 MEQ tablet Commonly known as:  K-DUR,KLOR-CON Take 1 tablet (20 mEq total) by mouth daily.   simvastatin  20 MG tablet Commonly known as:  ZOCOR Take 1 tablet by mouth at bedtime.        Discharged Condition: good  Physician Statement:   The Patient was personally examined, the discharge assessment and plan has been personally reviewed and I agree with ACNP Babcock's assessment and plan. > 30 minutes of time have been dedicated to discharge assessment, planning and discharge instructions.   Signed: Magdalen Spatz 01/24/2017, 10:26 AM

## 2017-01-26 LAB — METHADONE CONFIRMATION, URINE
METHADONE GC/MS CONF: 3005 ng/mL
METHADONE: POSITIVE — AB

## 2017-01-26 LAB — URINE DRUGS OF ABUSE SCREEN W ALC, ROUTINE (REF LAB)
AMPHETAMINES, URINE: NEGATIVE ng/mL
Barbiturate, Ur: NEGATIVE ng/mL
Cannabinoid Quant, Ur: NEGATIVE ng/mL
Cocaine (Metab.): NEGATIVE ng/mL
ETHANOL U, QUAN: NEGATIVE %
PHENCYCLIDINE, UR: NEGATIVE ng/mL
Propoxyphene, Urine: NEGATIVE ng/mL

## 2017-01-26 LAB — OPIATES CONFIRMATION, URINE
CODEINE: NEGATIVE
MORPHINE: POSITIVE — AB
Morphine GC/MS Conf: >3000 ng/mL
OPIATES: POSITIVE — AB

## 2017-01-26 LAB — DRUG PROFILE 799031: BENZODIAZEPINES: NEGATIVE

## 2017-01-27 LAB — CULTURE, BLOOD (ROUTINE X 2)
CULTURE: NO GROWTH
Culture: NO GROWTH
Special Requests: ADEQUATE
Special Requests: ADEQUATE

## 2017-02-07 DIAGNOSIS — R0602 Shortness of breath: Secondary | ICD-10-CM | POA: Diagnosis not present

## 2017-03-01 ENCOUNTER — Encounter (HOSPITAL_COMMUNITY): Payer: Self-pay

## 2017-03-01 ENCOUNTER — Emergency Department (HOSPITAL_COMMUNITY): Payer: Medicare HMO

## 2017-03-01 ENCOUNTER — Inpatient Hospital Stay (HOSPITAL_COMMUNITY): Payer: Medicare HMO

## 2017-03-01 ENCOUNTER — Other Ambulatory Visit: Payer: Self-pay

## 2017-03-01 ENCOUNTER — Inpatient Hospital Stay (HOSPITAL_COMMUNITY)
Admission: EM | Admit: 2017-03-01 | Discharge: 2017-03-03 | DRG: 190 | Disposition: A | Discharge status: Home / Self Care | Payer: Medicare HMO | Attending: Internal Medicine | Admitting: Internal Medicine

## 2017-03-01 DIAGNOSIS — F419 Anxiety disorder, unspecified: Secondary | ICD-10-CM | POA: Diagnosis present

## 2017-03-01 DIAGNOSIS — S0990XA Unspecified injury of head, initial encounter: Secondary | ICD-10-CM | POA: Diagnosis present

## 2017-03-01 DIAGNOSIS — W19XXXA Unspecified fall, initial encounter: Secondary | ICD-10-CM | POA: Diagnosis present

## 2017-03-01 DIAGNOSIS — R4020 Unspecified coma: Secondary | ICD-10-CM | POA: Diagnosis not present

## 2017-03-01 DIAGNOSIS — I10 Essential (primary) hypertension: Secondary | ICD-10-CM | POA: Diagnosis not present

## 2017-03-01 DIAGNOSIS — Z7951 Long term (current) use of inhaled steroids: Secondary | ICD-10-CM | POA: Diagnosis not present

## 2017-03-01 DIAGNOSIS — F1120 Opioid dependence, uncomplicated: Secondary | ICD-10-CM | POA: Diagnosis present

## 2017-03-01 DIAGNOSIS — E7800 Pure hypercholesterolemia, unspecified: Secondary | ICD-10-CM | POA: Diagnosis not present

## 2017-03-01 DIAGNOSIS — K219 Gastro-esophageal reflux disease without esophagitis: Secondary | ICD-10-CM | POA: Diagnosis not present

## 2017-03-01 DIAGNOSIS — Y92009 Unspecified place in unspecified non-institutional (private) residence as the place of occurrence of the external cause: Secondary | ICD-10-CM

## 2017-03-01 DIAGNOSIS — F17210 Nicotine dependence, cigarettes, uncomplicated: Secondary | ICD-10-CM | POA: Diagnosis present

## 2017-03-01 DIAGNOSIS — R069 Unspecified abnormalities of breathing: Secondary | ICD-10-CM | POA: Diagnosis not present

## 2017-03-01 DIAGNOSIS — I4891 Unspecified atrial fibrillation: Secondary | ICD-10-CM

## 2017-03-01 DIAGNOSIS — I471 Supraventricular tachycardia: Secondary | ICD-10-CM | POA: Diagnosis not present

## 2017-03-01 DIAGNOSIS — Z8249 Family history of ischemic heart disease and other diseases of the circulatory system: Secondary | ICD-10-CM | POA: Diagnosis not present

## 2017-03-01 DIAGNOSIS — R0602 Shortness of breath: Secondary | ICD-10-CM | POA: Diagnosis not present

## 2017-03-01 DIAGNOSIS — F329 Major depressive disorder, single episode, unspecified: Secondary | ICD-10-CM | POA: Diagnosis present

## 2017-03-01 DIAGNOSIS — F17200 Nicotine dependence, unspecified, uncomplicated: Secondary | ICD-10-CM | POA: Diagnosis not present

## 2017-03-01 DIAGNOSIS — K589 Irritable bowel syndrome without diarrhea: Secondary | ICD-10-CM | POA: Diagnosis present

## 2017-03-01 DIAGNOSIS — E785 Hyperlipidemia, unspecified: Secondary | ICD-10-CM | POA: Diagnosis present

## 2017-03-01 DIAGNOSIS — M1990 Unspecified osteoarthritis, unspecified site: Secondary | ICD-10-CM | POA: Diagnosis present

## 2017-03-01 DIAGNOSIS — J9621 Acute and chronic respiratory failure with hypoxia: Secondary | ICD-10-CM | POA: Diagnosis present

## 2017-03-01 DIAGNOSIS — G8929 Other chronic pain: Secondary | ICD-10-CM | POA: Diagnosis present

## 2017-03-01 DIAGNOSIS — E876 Hypokalemia: Secondary | ICD-10-CM | POA: Diagnosis not present

## 2017-03-01 DIAGNOSIS — I480 Paroxysmal atrial fibrillation: Secondary | ICD-10-CM | POA: Diagnosis not present

## 2017-03-01 DIAGNOSIS — Z801 Family history of malignant neoplasm of trachea, bronchus and lung: Secondary | ICD-10-CM

## 2017-03-01 DIAGNOSIS — R531 Weakness: Secondary | ICD-10-CM | POA: Diagnosis not present

## 2017-03-01 DIAGNOSIS — I340 Nonrheumatic mitral (valve) insufficiency: Secondary | ICD-10-CM | POA: Diagnosis not present

## 2017-03-01 DIAGNOSIS — R739 Hyperglycemia, unspecified: Secondary | ICD-10-CM | POA: Diagnosis present

## 2017-03-01 DIAGNOSIS — J441 Chronic obstructive pulmonary disease with (acute) exacerbation: Principal | ICD-10-CM | POA: Diagnosis present

## 2017-03-01 HISTORY — DX: Acute and chronic respiratory failure with hypoxia: J96.21

## 2017-03-01 HISTORY — DX: Chronic obstructive pulmonary disease with (acute) exacerbation: J44.1

## 2017-03-01 HISTORY — DX: Unspecified fall, initial encounter: W19.XXXA

## 2017-03-01 LAB — I-STAT VENOUS BLOOD GAS, ED
ACID-BASE EXCESS: 6 mmol/L — AB (ref 0.0–2.0)
BICARBONATE: 30.7 mmol/L — AB (ref 20.0–28.0)
O2 SAT: 78 %
PO2 VEN: 40 mmHg (ref 32.0–45.0)
TCO2: 32 mmol/L (ref 22–32)
pCO2, Ven: 43.1 mmHg — ABNORMAL LOW (ref 44.0–60.0)
pH, Ven: 7.461 — ABNORMAL HIGH (ref 7.250–7.430)

## 2017-03-01 LAB — CBC
HEMATOCRIT: 34.1 % — AB (ref 36.0–46.0)
Hemoglobin: 11.6 g/dL — ABNORMAL LOW (ref 12.0–15.0)
MCH: 30.8 pg (ref 26.0–34.0)
MCHC: 34 g/dL (ref 30.0–36.0)
MCV: 90.5 fL (ref 78.0–100.0)
Platelets: 105 10*3/uL — ABNORMAL LOW (ref 150–400)
RBC: 3.77 MIL/uL — ABNORMAL LOW (ref 3.87–5.11)
RDW: 14.1 % (ref 11.5–15.5)
WBC: 5.4 10*3/uL (ref 4.0–10.5)

## 2017-03-01 LAB — BASIC METABOLIC PANEL
Anion gap: 12 (ref 5–15)
BUN: 10 mg/dL (ref 6–20)
CALCIUM: 7.5 mg/dL — AB (ref 8.9–10.3)
CO2: 24 mmol/L (ref 22–32)
CREATININE: 0.76 mg/dL (ref 0.44–1.00)
Chloride: 100 mmol/L — ABNORMAL LOW (ref 101–111)
GFR calc Af Amer: >60 mL/min (ref >60)
GFR calc non Af Amer: >60 mL/min (ref >60)
GLUCOSE: 106 mg/dL — AB (ref 65–99)
Potassium: 2.4 mmol/L — CL (ref 3.5–5.1)
Sodium: 136 mmol/L (ref 135–145)

## 2017-03-01 LAB — BRAIN NATRIURETIC PEPTIDE: B Natriuretic Peptide: 69.9 pg/mL (ref 0.0–100.0)

## 2017-03-01 LAB — MAGNESIUM: Magnesium: 2.2 mg/dL (ref 1.7–2.4)

## 2017-03-01 LAB — TROPONIN I: Troponin I: <0.03 ng/mL (ref <0.03)

## 2017-03-01 MED ORDER — POTASSIUM CHLORIDE 20 MEQ PO PACK
40.0000 meq | PACK | Freq: Once | ORAL | Status: AC
  Administered 2017-03-01: 40 meq via ORAL
  Filled 2017-03-01: qty 2

## 2017-03-01 MED ORDER — SODIUM CHLORIDE 0.9 % IV BOLUS (SEPSIS)
500.0000 mL | Freq: Once | INTRAVENOUS | Status: AC
  Administered 2017-03-01: 500 mL via INTRAVENOUS

## 2017-03-01 MED ORDER — METHYLPREDNISOLONE SODIUM SUCC 125 MG IJ SOLR
125.0000 mg | Freq: Once | INTRAMUSCULAR | Status: AC
  Administered 2017-03-01: 125 mg via INTRAVENOUS
  Filled 2017-03-01: qty 2

## 2017-03-01 MED ORDER — IPRATROPIUM BROMIDE 0.02 % IN SOLN
1.5000 mg | Freq: Once | RESPIRATORY_TRACT | Status: AC
  Administered 2017-03-01: 1.5 mg via RESPIRATORY_TRACT
  Filled 2017-03-01: qty 7.5

## 2017-03-01 MED ORDER — ALBUTEROL (5 MG/ML) CONTINUOUS INHALATION SOLN
10.0000 mg/h | INHALATION_SOLUTION | Freq: Once | RESPIRATORY_TRACT | Status: AC
  Administered 2017-03-01: 10 mg/h via RESPIRATORY_TRACT
  Filled 2017-03-01: qty 20

## 2017-03-01 MED ORDER — MAGNESIUM SULFATE IN D5W 1-5 GM/100ML-% IV SOLN
1.0000 g | Freq: Once | INTRAVENOUS | Status: AC
  Administered 2017-03-01: 1 g via INTRAVENOUS
  Filled 2017-03-01: qty 100

## 2017-03-01 MED ORDER — POTASSIUM CHLORIDE 10 MEQ/100ML IV SOLN
10.0000 meq | Freq: Once | INTRAVENOUS | Status: AC
  Administered 2017-03-01: 10 meq via INTRAVENOUS
  Filled 2017-03-01: qty 100

## 2017-03-01 NOTE — ED Notes (Signed)
Pt to CT

## 2017-03-01 NOTE — ED Notes (Signed)
Pt states that she lost her balance Thursday and fell, hit her face and chest. C/o pain to chest and face. Pt also c/o headache. Pt denies any heart problems. Heart rate around 124 currently. Md aware. Daughter at bedside.

## 2017-03-01 NOTE — ED Provider Notes (Signed)
[facility] EMERGENCY DEPARTMENT Provider Note   CSN: [id number] Arrival date & time: 03/01/17  4496     History   Chief Complaint Chief Complaint  Patient presents with  . Shortness of Breath    HPI Allison Vaughan is a 72 y.o. female.  HPI  72 year old female history of anxiety, COPD compliant with her home nebulizer treatment, chronic tobacco abuse presents emergency department with 1 week of nausea/no vomiting and generalized weakness times 1 week and over the past 48 hours increased sputum production with associated cough/shortness of breath/dyspnea.  Patient denies any fever.  Patient denies any recent hospitalization/antibiotics.  Patient does not take supplemental O2.  Patient arrives via EMS stating she was tachypneic but otherwise not hypoxic.  Patient was given DuoNeb x1 and felt there is an interval improvement with wheezing that was present on first arrival.  Past Medical History:  Diagnosis Date  . Anxiety   . CAP (community acquired pneumonia) 04/18/2014  . Chronic hypoxemic respiratory failure ([place]) 06/26/2012  . Chronic pain   . COPD (chronic obstructive pulmonary disease) ([place])   . DEPRESSIVE DISORDER, NOS 03/13/2006   Qualifier: Diagnosis of  By: Damita Dunnings MD, [place], NO ESOPHAGITIS 03/13/2006   Qualifier: Diagnosis of  By: Damita Dunnings MD, Burt Ek 03/13/2006   Qualifier: Diagnosis of  By: Damita Dunnings MD, Phillip Heal    . HYPERTENSION, BENIGN SYSTEMIC 03/13/2006   Qualifier: Diagnosis of  By: Damita Dunnings MD, Phillip Heal    . Irritable bowel syndrome 03/13/2006   Qualifier: Diagnosis of  By: Damita Dunnings MD, Phillip Heal    . Lung nodule 04/16/2014  . MENOPAUSAL SYNDROME 03/13/2006   Qualifier: Diagnosis of  By: Damita Dunnings MD, Deirdre Evener, [place] SITES 03/13/2006   Qualifier: Diagnosis of  By: Damita Dunnings MD, Isac Caddy DEPENDENCE 03/13/2006   Qualifier: Diagnosis of  By: Damita Dunnings MD, Phillip Heal      Patient Active Problem  List   Diagnosis Date Noted  . COPD with acute exacerbation ([place]) 03/01/2017  . Atrial fibrillation with RVR ([place]) 03/01/2017  . Acute on chronic respiratory failure with hypoxia ([place]) 03/01/2017  . Fall at home, initial encounter 03/01/2017  . Accidental drug overdose   . Respiratory failure ([place]) 01/22/2017  . Acute encephalopathy   . AKI (acute kidney injury) ([place]) 04/30/2014  . Pulmonary nodule 04/30/2014  . Essential hypertension 04/30/2014  . Hypokalemia 04/30/2014  . Hyponatremia 04/30/2014  . Hypotension 04/30/2014  . Nausea 04/30/2014  . Lung nodule 04/16/2014  . Chronic hypoxemic respiratory failure ([place]) 06/26/2012  . COPD (chronic obstructive pulmonary disease) ([place]) 06/26/2012  . COPD exacerbation ([place]) 06/26/2012  . HYPERCHOLESTEROLEMIA 03/13/2006  . TOBACCO DEPENDENCE 03/13/2006  . Mood disorder ([place]) 03/13/2006  . HYPERTENSION, BENIGN SYSTEMIC 03/13/2006  . GASTROESOPHAGEAL REFLUX, NO ESOPHAGITIS 03/13/2006  . IRRITABLE BOWEL SYNDROME 03/13/2006  . MENOPAUSAL SYNDROME 03/13/2006  . OSTEOARTHRITIS, MULTI SITES 03/13/2006    Past Surgical History:  Procedure Laterality Date  . EYE SURGERY      OB History    No data available       Home Medications    Prior to Admission medications   Medication Sig Start Date End Date Taking? Authorizing Provider  albuterol (PROVENTIL HFA;VENTOLIN HFA) 108 (90 BASE) MCG/ACT inhaler Inhale 2 puffs into the lungs every 6 (six) hours as needed for wheezing. 06/09/12  Yes Oti, Brantley Stage, MD  albuterol (PROVENTIL) (2.5 MG/3ML) 0.083% nebulizer solution  Take 3 mLs (2.5 mg total) by nebulization every 2 (two) hours as needed for wheezing. 04/20/14  Yes Rama, Venetia Maxon, MD  amLODipine (NORVASC) 10 MG tablet Take 1 tablet (10 mg total) by mouth daily. 05/02/14  Yes Kelvin Cellar, MD  FLUoxetine (PROZAC) 40 MG capsule Take 40 mg by mouth every morning.    Yes [provider]  methadone (DOLOPHINE) 1 MG/1ML solution Take 30  mg by mouth daily. [facility].   Yes [provider]  potassium chloride (K-DUR,KLOR-CON) 10 MEQ tablet Take 10 mEq by mouth daily. 02/11/17  Yes [provider]  simvastatin (ZOCOR) 20 MG tablet Take 1 tablet by mouth at bedtime.  02/16/14  Yes [provider]  budesonide (PULMICORT) 0.25 MG/2ML nebulizer solution Take 2 mLs (0.25 mg total) by nebulization 2 (two) times daily. Patient not taking: Reported on 03/01/2017 04/20/14   Rama, Venetia Maxon, MD  ipratropium (ATROVENT) 0.02 % nebulizer solution Take 2.5 mLs (0.5 mg total) by nebulization 4 (four) times daily. Patient not taking: Reported on 03/01/2017 04/20/14   Rama, Venetia Maxon, MD    Family History Family History  Problem Relation Age of Onset  . CAD Father   . Lung cancer Brother     Social History Social History   Tobacco Use  . Smoking status: Current Every Day Smoker    Packs/day: 1.00    Types: Cigarettes  . Smokeless tobacco: Never Used  Substance Use Topics  . Alcohol use: No    Frequency: Never    Comment: not in a long time  . Drug use: No     Allergies   Patient has no known allergies.   Review of Systems Review of Systems  Review of Systems  Constitutional: Negative for fever and chills.  HENT: Negative for ear pain, sore throat and trouble swallowing.   Eyes: Negative for pain and visual disturbance.  Respiratory: see HPI Cardiovascular: Negative for chest pain and leg swelling.  Gastrointestinal: Negative for nausea, vomiting, abdominal pain and diarrhea.  Genitourinary: Negative for dysuria, urgency and frequency.  Musculoskeletal: Negative for back pain and joint swelling.  Skin: Negative for rash and wound.  Neurological: Negative for dizziness, syncope, speech difficulty, weakness and numbness.  Physical Exam Updated Vital Signs BP 133/67   Pulse 83   Temp (!) 97.5 F (36.4 C) (Oral)   Resp (!) 22   Ht 5\' 2"  (1.575 m)   Wt 44 kg (97 lb)   SpO2 96%    BMI 17.74 kg/m   Physical Exam  Physical Exam Vitals:   03/01/17 2230 03/01/17 2245  BP: 113/67 133/67  Pulse: (!) 125 83  Resp: (!) 25 (!) 22  Temp:    SpO2: 98% 96%   Constitutional: Patient is in no acute distress Head: Normocephalic and atraumatic.  Eyes: Extraocular motion intact, no scleral icterus Neck: Supple without meningismus, mass, or overt JVD Respiratory: bilateral expiratory wheeze. No respiratory distress. CV: Heart regular rate and rhythm, no obvious murmurs.  Pulses +2 and symmetric Abdomen: Soft, non-tender, non-distended MSK: Extremities are atraumatic without deformity, ROM intact Skin: Warm, dry, intact Neuro: Alert and oriented, no motor deficit noted Psychiatric: Mood and affect are normal.  ED Treatments / Results  Labs (all labs ordered are listed, but only abnormal results are displayed) Labs Reviewed  CBC - Abnormal; Notable for the following components:      Result Value   RBC 3.77 (*)    Hemoglobin 11.6 (*)  HCT 34.1 (*)    Platelets 105 (*)    All other components within normal limits  BASIC METABOLIC PANEL - Abnormal; Notable for the following components:   Potassium 2.4 (*)    Chloride 100 (*)    Glucose, Bld 106 (*)    Calcium 7.5 (*)    All other components within normal limits  I-STAT VENOUS BLOOD GAS, ED - Abnormal; Notable for the following components:   pH, Ven 7.461 (*)    pCO2, Ven 43.1 (*)    Bicarbonate 30.7 (*)    Acid-Base Excess 6.0 (*)    All other components within normal limits  RESPIRATORY PANEL BY PCR  BRAIN NATRIURETIC PEPTIDE  MAGNESIUM  BLOOD GAS, VENOUS  TROPONIN I  TROPONIN I  TROPONIN I  OCCULT BLOOD X 1 CARD TO LAB, STOOL    EKG  EKG Interpretation  Date/Time:  Saturday March 01 2017 18:32:40 EST Ventricular Rate:  105 PR Interval:    QRS Duration: 87 QT Interval:  428 QTC Calculation: 524 R Axis:   -122 Text Interpretation:  Sinus arrhythmia Multiform ventricular premature complexes  Borderline short PR interval Anteroseptal infarct, age indeterminate Prolonged QT interval Confirmed by Lacretia Leigh ([id number]) on 03/01/2017 6:54:33 PM       Radiology Dg Chest Portable 1 View  Result Date: 03/01/2017 CLINICAL DATA:  Shortness of breath. EXAM: PORTABLE CHEST 1 VIEW COMPARISON:  01/24/2017 FINDINGS: Normal heart size. Aortic atherosclerosis noted. No pleural effusion or edema identified. No airspace opacities noted. IMPRESSION: 1. No active disease. Electronically Signed   By: Kerby Moors M.D.   On: 03/01/2017 19:22    Procedures Procedures (including critical care time)  Medications Ordered in ED Medications  magnesium sulfate IVPB 1 g 100 mL (1 g Intravenous New Bag/Given 03/01/17 2300)  albuterol (PROVENTIL,VENTOLIN) solution continuous neb (10 mg/hr Nebulization Given 03/01/17 1854)  ipratropium (ATROVENT) nebulizer solution 1.5 mg (1.5 mg Nebulization Given 03/01/17 1854)  methylPREDNISolone sodium succinate (SOLU-MEDROL) 125 mg/2 mL injection 125 mg (125 mg Intravenous Given 03/01/17 1845)  potassium chloride 10 mEq in 100 mL IVPB (0 mEq Intravenous Stopped 03/01/17 2259)  potassium chloride (KLOR-CON) packet 40 mEq (40 mEq Oral Given 03/01/17 2044)  sodium chloride 0.9 % bolus 500 mL (500 mLs Intravenous New Bag/Given 03/01/17 2245)     Initial Impression / Assessment and Plan / ED Course  I have reviewed the triage vital signs and the nursing notes.  Pertinent labs & imaging results that were available during my care of the patient were reviewed by me and considered in my medical decision making (see chart for details).     71 year old female history of anxiety, COPD compliant with her home nebulizer treatment, chronic tobacco abuse presents emergency department with 1 week of nausea/no vomiting and generalized weakness times 1 week and over the past 48 hours increased sputum production with associated cough/shortness of breath/dyspnea.  Patient denies any fever.   Recent hospitalization Jan 2019 with hypoxic respiratory failure/intubation suspected benzo WD.  Patient does not take supplemental O2.  Patient arrives via EMS stating she was tachypneic but otherwise not hypoxic.  Patient was given DuoNeb x1 and felt there is an interval improvement with wheezing that was present on first arrival.  Patient arrived tachypneic but otherwise not acute respiratory distress.  1 hour of DuoNeb +125 mg of Solu-Medrol given.  Patient still working to breathe with tachypnea and worsening lung sounds.  Review of labs shows hypokalemia 2.4 subsequently given in the emergency  department 1 g of magnesium sulfate along with 10 mEq IV potassium and 40 mEq p.o.  Review of EKG shows intermittent PVCs with QT prolongation of 540.   defer anti-emetics.  RVP pending along with venous blood gas requested by hospitalist.  hospitalist requested CT head w/out contrast; guiac. Noted Afib RVR with her during her evaluation.   Doubt PE, doubt PNA, doubt ACS, doubt PTX  Patient's been admitted for acute on chronic COPD exacerbation/hypokalemia.  Plan for hospice admission to floor.   Final Clinical Impressions(s) / ED Diagnoses   Final diagnoses:  COPD exacerbation ([place])  Atrial fibrillation, unspecified type [facility])    ED Discharge Orders    None       Willette Alma, DO 03/01/17 2318

## 2017-03-01 NOTE — ED Notes (Signed)
Portable in room.  

## 2017-03-01 NOTE — H&P (Signed)
Allison Vaughan:[id number] DOB: Sep 02, 1945 DOA: 03/01/2017     PCP: Antonietta Jewel, MD   Outpatient Specialists:none Patient coming from:    home Lives   With family    Chief Complaint: Shortness of breath  HPI: Allison Vaughan is a 72 y.o. female with medical history significant of COPD past history of intubation, tobacco abuse chronic opiate use, benzodiazepine use, HTN GERD      Presented with an wheezing started today attempted to use nebulizer with some improvement not on oxygen at home requiring 2 L here. REPOTS NO HX OF A.FIB OR CAD. Patient fell down 2 days ago. She turned and fell on her face and chest. She reports injuring her right arm but able to move.  She have been having head aches and feeling light headed.   In January patient presented with COPD exacerbation and mental status changes her family prior to this were weaning her off of Xanax and she was using methadone clinic to come off of chronic opioids she originally presented with confusion difficulty breathing and required intubation extubated on 10 January and discharged the next day.  She is still currently maintained on methadone While in ER: She was given magnesium and potassium And DuoNeb in route with improvement. Reports feeling palpitations while in ER HR went up to 140's with A.fib RVR on the monitor. Converted back to sinus with out intervention.   Significant initial  Findings: VBG 7.461/43 WBC 5.4 Hg 11.6 plt 105 Na 136 K 2.4  BNP 69.9 CXR no acute  IN ER:  Temp (24hrs), Avg:97.5 F (36.4 C), Min:97.5 F (36.4 C), Max:97.5 F (36.4 C)      on arrival  ED Triage Vitals  Enc Vitals Group     BP 03/01/17 1826 103/70     Pulse Rate 03/01/17 1826 64     Resp 03/01/17 1826 (!) 24     Temp 03/01/17 1826 (!) 97.5 F (36.4 C)     Temp Source 03/01/17 1826 Oral     SpO2 03/01/17 1826 99 %     Weight 03/01/17 1831 97 lb (44 kg)     Height 03/01/17 1831 5\' 2"  (1.575 m)     Head  Circumference --      Peak Flow --      Pain Score 03/01/17 1831 0     Pain Loc --      Pain Edu? --      Excl. in GC? --     Latest 97.5 RR 24 95% HR 64 BP 103/70  Following Medications were ordered in ER: Medications  magnesium sulfate IVPB 1 g 100 mL (not administered)  potassium chloride 10 mEq in 100 mL IVPB (not administered)  potassium chloride (KLOR-CON) packet 40 mEq (not administered)  albuterol (PROVENTIL,VENTOLIN) solution continuous neb (10 mg/hr Nebulization Given 03/01/17 1854)  ipratropium (ATROVENT) nebulizer solution 1.5 mg (1.5 mg Nebulization Given 03/01/17 1854)  methylPREDNISolone sodium succinate (SOLU-MEDROL) 125 mg/2 mL injection 125 mg (125 mg Intravenous Given 03/01/17 1845)    Hospitalist was called for admission for COPD exacerbation    Review of Systems:    Pertinent positives include: shortness of breath at rest. dyspnea on exertion, non-productive cough,wheezing.  Constitutional:  No weight loss, night sweats, Fevers, chills, fatigue, weight loss  HEENT:  No headaches, Difficulty swallowing,Tooth/dental problems,Sore throat,  No sneezing, itching, ear ache, nasal congestion, post nasal drip,  Cardio-vascular:  No chest pain, Orthopnea, PND, anasarca, dizziness, palpitations.no Bilateral lower  extremity swelling  GI:  No heartburn, indigestion, abdominal pain, nausea, vomiting, diarrhea, change in bowel habits, loss of appetite, melena, blood in stool, hematemesis Resp:  no No excess mucus, no productive cough, No  No coughing up of blood.No change in color of mucus.No  Skin:  no rash or lesions. No jaundice GU:  no dysuria, change in color of urine, no urgency or frequency. No straining to urinate.  No flank pain.  Musculoskeletal:  No joint pain or no joint swelling. No decreased range of motion. No back pain.  Psych:  No change in mood or affect. No depression or anxiety. No memory loss.  Neuro: no localizing neurological complaints, no  tingling, no weakness, no double vision, no gait abnormality, no slurred speech, no confusion  As per HPI otherwise 10 point review of systems negative.   Past Medical History: Past Medical History:  Diagnosis Date  . Anxiety   . CAP (community acquired pneumonia) 04/18/2014  . Chronic hypoxemic respiratory failure ([place]) 06/26/2012  . Chronic pain   . COPD (chronic obstructive pulmonary disease) ([place])   . DEPRESSIVE DISORDER, NOS 03/13/2006   Qualifier: Diagnosis of  By: Damita Dunnings MD, [place], NO ESOPHAGITIS 03/13/2006   Qualifier: Diagnosis of  By: Damita Dunnings MD, Burt Ek 03/13/2006   Qualifier: Diagnosis of  By: Damita Dunnings MD, Phillip Heal    . HYPERTENSION, BENIGN SYSTEMIC 03/13/2006   Qualifier: Diagnosis of  By: Damita Dunnings MD, Phillip Heal    . Irritable bowel syndrome 03/13/2006   Qualifier: Diagnosis of  By: Damita Dunnings MD, Phillip Heal    . Lung nodule 04/16/2014  . MENOPAUSAL SYNDROME 03/13/2006   Qualifier: Diagnosis of  By: Damita Dunnings MD, Deirdre Evener, [place] SITES 03/13/2006   Qualifier: Diagnosis of  By: Damita Dunnings MD, Isac Caddy DEPENDENCE 03/13/2006   Qualifier: Diagnosis of  By: Damita Dunnings MD, Phillip Heal     Past Surgical History:  Procedure Laterality Date  . EYE SURGERY       Social History:  Ambulatory  independently    reports that she has been smoking cigarettes.  She has been smoking about 1.00 pack per day. she has never used smokeless tobacco. She reports that she does not drink alcohol or use drugs.  Allergies:  No Known Allergies     Family History:   Family History  Problem Relation Age of Onset  . CAD Father   . Lung cancer Brother     Medications: Prior to Admission medications   Medication Sig Start Date End Date Taking? Authorizing Provider  albuterol (PROVENTIL HFA;VENTOLIN HFA) 108 (90 BASE) MCG/ACT inhaler Inhale 2 puffs into the lungs every 6 (six) hours as needed for wheezing. 06/09/12   Monika Salk, MD  albuterol  (PROVENTIL) (2.5 MG/3ML) 0.083% nebulizer solution Take 3 mLs (2.5 mg total) by nebulization every 2 (two) hours as needed for wheezing. 04/20/14   Rama, Venetia Maxon, MD  amLODipine (NORVASC) 10 MG tablet Take 1 tablet (10 mg total) by mouth daily. 05/02/14   Kelvin Cellar, MD  budesonide (PULMICORT) 0.25 MG/2ML nebulizer solution Take 2 mLs (0.25 mg total) by nebulization 2 (two) times daily. 04/20/14   Rama, Venetia Maxon, MD  FLUoxetine (PROZAC) 40 MG capsule Take 40 mg by mouth every morning.     [provider]  guaiFENesin (MUCINEX) 600 MG 12 hr tablet Take 1,200 mg by mouth 2 (two) times daily as needed for congestion.  [provider]  ipratropium (ATROVENT) 0.02 % nebulizer solution Take 2.5 mLs (0.5 mg total) by nebulization 4 (four) times daily. 04/20/14   Rama, Venetia Maxon, MD  methadone (DOLOPHINE) 1 MG/1ML solution Take 30 mg by mouth daily. [facility].    [provider]  omeprazole (PRILOSEC) 40 MG capsule Take 1 capsule (40 mg total) by mouth daily. Patient not taking: Reported on 07/12/2015 04/20/14   Rama, Venetia Maxon, MD  potassium chloride SA (K-DUR,KLOR-CON) 20 MEQ tablet Take 1 tablet (20 mEq total) by mouth daily. 07/12/15   Domenic Moras, PA-C  simvastatin (ZOCOR) 20 MG tablet Take 1 tablet by mouth at bedtime.  02/16/14   [provider]    Physical Exam: Patient Vitals for the past 24 hrs:  BP Temp Temp src Pulse Resp SpO2 Height Weight  03/01/17 1854 - - - - - 95 % - -  03/01/17 1831 - - - - - - 5\' 2"  (1.575 m) 44 kg (97 lb)  03/01/17 1826 103/70 (!) 97.5 F (36.4 C) Oral 64 (!) 24 99 % - -    1. General:  in No Acute distress  Chronically ill  -appearing 2. Psychological: Alert and Oriented 3. Head/ENT:    Dry Mucous Membranes                          Head Non traumatic, neck supple                            Poor Dentition 4. SKIN:   decreased Skin turgor,  Skin clean Dry and intact no rash 5. Heart: irRegular rate and  rhythm no  Murmur, no Rub or gallop 6. Lungs: some wheezes and  crackles   7. Abdomen: Soft,  non-tender, Non distended  bowel sounds present 8. Lower extremities: no clubbing, cyanosis, or edema 9. Neurologically Grossly intact, moving all 4 extremities equally   intact 10. MSK: Normal range of motion   body mass index is 17.74 kg/m.  Labs on Admission:   Labs on Admission: I have personally reviewed following labs and imaging studies  CBC: Recent Labs  Lab 03/01/17 1841  WBC 5.4  HGB 11.6*  HCT 34.1*  MCV 90.5  PLT 322*   Basic Metabolic Panel: Recent Labs  Lab 03/01/17 1841  NA 136  K 2.4*  CL 100*  CO2 24  GLUCOSE 106*  BUN 10  CREATININE 0.76  CALCIUM 7.5*   GFR: Estimated Creatinine Clearance: 43.5 mL/min (by C-G formula based on SCr of 0.76 mg/dL). Liver Function Tests: No results for input(s): AST, ALT, ALKPHOS, BILITOT, PROT, ALBUMIN in the last 168 hours. No results for input(s): LIPASE, AMYLASE in the last 168 hours. No results for input(s): AMMONIA in the last 168 hours. Coagulation Profile: No results for input(s): INR, PROTIME in the last 168 hours. Cardiac Enzymes: No results for input(s): CKTOTAL, CKMB, CKMBINDEX, TROPONINI in the last 168 hours. BNP (last 3 results) No results for input(s): PROBNP in the last 8760 hours. HbA1C: No results for input(s): HGBA1C in the last 72 hours. CBG: No results for input(s): GLUCAP in the last 168 hours. Lipid Profile: No results for input(s): CHOL, HDL, LDLCALC, TRIG, CHOLHDL, LDLDIRECT in the last 72 hours. Thyroid Function Tests: No results for input(s): TSH, T4TOTAL, FREET4, T3FREE, THYROIDAB in the last 72 hours. Anemia Panel: No results for input(s): VITAMINB12, FOLATE, FERRITIN, TIBC, IRON,  RETICCTPCT in the last 72 hours. Urine analysis: Sepsis Labs: @LABRCNTIP (procalcitonin:4,lacticidven:4) )No results found for this or any previous visit (from the past 240 hour(s)).    UA not ordered  No  results found for: HGBA1C  Estimated Creatinine Clearance: 43.5 mL/min (by C-G formula based on SCr of 0.76 mg/dL).  BNP (last 3 results) No results for input(s): PROBNP in the last 8760 hours.   ECG REPORT  Independently reviewed Rate: 105  Rhythm: History of multiple PVCs ST&T Change: No acute ischemic changes   QTC 524  Filed Weights   03/01/17 1831  Weight: 44 kg (97 lb)     Cultures:    Component Value Date/Time   SDES BLOOD RIGHT HAND 01/22/2017 1700   SPECREQUEST  01/22/2017 1700    BOTTLES DRAWN AEROBIC AND ANAEROBIC Blood Culture adequate volume   CULT NO GROWTH 5 DAYS 01/22/2017 1700   REPTSTATUS 01/27/2017 FINAL 01/22/2017 1700     Radiological Exams on Admission: Dg Chest Portable 1 View  Result Date: 03/01/2017 CLINICAL DATA:  Shortness of breath. EXAM: PORTABLE CHEST 1 VIEW COMPARISON:  01/24/2017 FINDINGS: Normal heart size. Aortic atherosclerosis noted. No pleural effusion or edema identified. No airspace opacities noted. IMPRESSION: 1. No active disease. Electronically Signed   By: Kerby Moors M.D.   On: 03/01/2017 19:22    Chart has been reviewed    Assessment/Plan  72 y.o. female with medical history significant of COPD past history of intubation, tobacco abuse chronic opiate use, benzodiazepine use, HTN GERD Admitted for COPD exacerbation was found to be in A.fib w RVR  Present on Admission: . COPD with acute exacerbation ([place]) -  -  - Will initiate: Steroid taper  -  Antibiotics  Doxycycline, -   XopenexPRN, - scheduled Atrovent,  -  Breo or Dulera at discharge   -  Mucinex.  Titrate O2 to saturation >90%. Follow patients respiratory status.  Order respiratory panel and influenza PCR  Currently mentating well no evidence of symptomatic hypercarbia  . Atrial fibrillation with RVR (HCC) -  - Admit to step down as patient have been intermittently tachycardiac currently back to sinus       CHA2D-VASC score 3       Will start on Heparin for  now               Check TSH      Cycle cardiac enzymes      Obtain ECHO      Cardiology consult in AM  . Acute on chronic respiratory failure with hypoxia (HCC) - most liklely due to COPD exacerbation.  Monitor on cont. Pulse ox, administer O2   . TOBACCO DEPENDENCE - Nicotine patch ordered spoke about importance of quiting . Hypokalemia - - will replace and repeat in AM,  check magnesium level and replace as needed  . HYPERTENSION, BENIGN SYSTEMIC - stable continue  Home medications . GASTROESOPHAGEAL REFLUX, NO ESOPHAGITIS - stable, continue home meds . HYPERCHOLESTEROLEMIA - continue statin  Recent fall - head injury CT head without evidence of bleeding  Chronic opioid dependence - on methadone, continue for now but monitor  ECG for significant QTC prolongation. Repeat ECG in AM after rehydration and electrolyte replacement   Anxiety - possibly contributing to increased work of breathing. Will give a trial of small dose  Anxiolytic  Other plan as per orders.  DVT prophylaxis:  heparin  Code Status:   FULL CODE  as per patient    Family Communication:  Family   at  Bedside  plan of care was discussed with  Daughter  Disposition Plan:   To home once workup is complete and patient is stable                      Would benefit from PT/OT eval prior to DC  ordered                       Consults called: email cardiology    Admission status:    inpatient       Level of care      SDU      I have spent a total of 56 min on this admission  Aili Casillas 03/02/2017, 2:42 AM   [facility]  Pager [phone]   after 2 AM please page floor coverage PA If 7AM-7PM, please contact the day team taking care of the patient  [email or web address]  Password [email or web address]

## 2017-03-01 NOTE — ED Triage Notes (Signed)
Per [facility], pt from home with complaint of shob worsening today, had some wheezing, given neb now wheezing decreased, hx of copd. 115/64, HR 75, RR 24, spo2 96% on 2L. Pt is not on o2 at home, uses nebulizer.

## 2017-03-01 NOTE — ED Notes (Signed)
Patient denies pain and is resting comfortably.  

## 2017-03-01 NOTE — ED Provider Notes (Signed)
I saw and evaluated the patient, reviewed the resident's note and I agree with the findings and plan.   EKG Interpretation  Date/Time:  Saturday March 01 2017 18:32:40 EST Ventricular Rate:  105 PR Interval:    QRS Duration: 87 QT Interval:  428 QTC Calculation: 524 R Axis:   -122 Text Interpretation:  Sinus arrhythmia Multiform ventricular premature complexes Borderline short PR interval Anteroseptal infarct, age indeterminate Prolonged QT interval Confirmed by Lacretia Leigh [phone]) on 03/01/2017 6:54:35 PM     72 year old female presents with worsening dyspnea times 1 day.  Cough is been productive.  EMS called and patient given albuterol after she was noted to have significant wheezing.  Wheezing has improved.  X-ray is pending at this time.  Will give IV steroids as well as more beta agonist therapy and reevaluate   Lacretia Leigh, MD 03/01/17 1914

## 2017-03-01 NOTE — ED Notes (Addendum)
Pt able to use bedside commode.  

## 2017-03-02 ENCOUNTER — Other Ambulatory Visit: Payer: Self-pay

## 2017-03-02 DIAGNOSIS — I471 Supraventricular tachycardia: Secondary | ICD-10-CM

## 2017-03-02 DIAGNOSIS — J9621 Acute and chronic respiratory failure with hypoxia: Secondary | ICD-10-CM

## 2017-03-02 DIAGNOSIS — J441 Chronic obstructive pulmonary disease with (acute) exacerbation: Principal | ICD-10-CM

## 2017-03-02 DIAGNOSIS — E7800 Pure hypercholesterolemia, unspecified: Secondary | ICD-10-CM

## 2017-03-02 LAB — CBC
HCT: 36.8 % (ref 36.0–46.0)
HEMOGLOBIN: 12.5 g/dL (ref 12.0–15.0)
MCH: 30.6 pg (ref 26.0–34.0)
MCHC: 34 g/dL (ref 30.0–36.0)
MCV: 90 fL (ref 78.0–100.0)
Platelets: 136 10*3/uL — ABNORMAL LOW (ref 150–400)
RBC: 4.09 MIL/uL (ref 3.87–5.11)
RDW: 14.1 % (ref 11.5–15.5)
WBC: 5.3 10*3/uL (ref 4.0–10.5)

## 2017-03-02 LAB — COMPREHENSIVE METABOLIC PANEL
ALT: 10 U/L — AB (ref 14–54)
ANION GAP: 14 (ref 5–15)
AST: 28 U/L (ref 15–41)
Albumin: 3.1 g/dL — ABNORMAL LOW (ref 3.5–5.0)
Alkaline Phosphatase: 66 U/L (ref 38–126)
BUN: 12 mg/dL (ref 6–20)
CHLORIDE: 94 mmol/L — AB (ref 101–111)
CO2: 23 mmol/L (ref 22–32)
Calcium: 8.3 mg/dL — ABNORMAL LOW (ref 8.9–10.3)
Creatinine, Ser: 0.94 mg/dL (ref 0.44–1.00)
GFR, EST NON AFRICAN AMERICAN: 59 mL/min — AB (ref >60)
Glucose, Bld: 226 mg/dL — ABNORMAL HIGH (ref 65–99)
Potassium: 3.8 mmol/L (ref 3.5–5.1)
SODIUM: 131 mmol/L — AB (ref 135–145)
Total Bilirubin: 0.4 mg/dL (ref 0.3–1.2)
Total Protein: 5.5 g/dL — ABNORMAL LOW (ref 6.5–8.1)

## 2017-03-02 LAB — RESPIRATORY PANEL BY PCR
Adenovirus: NOT DETECTED
Bordetella pertussis: NOT DETECTED
CORONAVIRUS 229E-RVPPCR: NOT DETECTED
CORONAVIRUS NL63-RVPPCR: NOT DETECTED
CORONAVIRUS OC43-RVPPCR: NOT DETECTED
Chlamydophila pneumoniae: NOT DETECTED
Coronavirus HKU1: NOT DETECTED
INFLUENZA B-RVPPCR: NOT DETECTED
Influenza A: NOT DETECTED
METAPNEUMOVIRUS-RVPPCR: NOT DETECTED
Mycoplasma pneumoniae: NOT DETECTED
PARAINFLUENZA VIRUS 1-RVPPCR: NOT DETECTED
PARAINFLUENZA VIRUS 2-RVPPCR: NOT DETECTED
Parainfluenza Virus 3: NOT DETECTED
Parainfluenza Virus 4: NOT DETECTED
RESPIRATORY SYNCYTIAL VIRUS-RVPPCR: DETECTED — AB
Rhinovirus / Enterovirus: NOT DETECTED

## 2017-03-02 LAB — TROPONIN I
Troponin I: <0.03 ng/mL (ref <0.03)
Troponin I: <0.03 ng/mL (ref <0.03)

## 2017-03-02 LAB — PHOSPHORUS: PHOSPHORUS: 2.3 mg/dL — AB (ref 2.5–4.6)

## 2017-03-02 LAB — TSH: TSH: 0.536 u[IU]/mL (ref 0.350–4.500)

## 2017-03-02 LAB — HEPARIN LEVEL (UNFRACTIONATED): Heparin Unfractionated: <0.1 IU/mL — ABNORMAL LOW (ref 0.30–0.70)

## 2017-03-02 LAB — PROTIME-INR
INR: 0.96
PROTHROMBIN TIME: 12.7 s (ref 11.4–15.2)

## 2017-03-02 LAB — APTT: APTT: 32 s (ref 24–36)

## 2017-03-02 LAB — MAGNESIUM: Magnesium: 2.3 mg/dL (ref 1.7–2.4)

## 2017-03-02 MED ORDER — HEPARIN (PORCINE) IN NACL 100-0.45 UNIT/ML-% IJ SOLN
600.0000 [IU]/h | INTRAMUSCULAR | Status: DC
  Administered 2017-03-02: 600 [IU]/h via INTRAVENOUS
  Filled 2017-03-02: qty 250

## 2017-03-02 MED ORDER — GUAIFENESIN ER 600 MG PO TB12
600.0000 mg | ORAL_TABLET | Freq: Two times a day (BID) | ORAL | Status: DC
  Administered 2017-03-02 – 2017-03-03 (×3): 600 mg via ORAL
  Filled 2017-03-02 (×3): qty 1

## 2017-03-02 MED ORDER — ENSURE ENLIVE PO LIQD
237.0000 mL | Freq: Two times a day (BID) | ORAL | Status: DC
  Administered 2017-03-03: 237 mL via ORAL

## 2017-03-02 MED ORDER — HEPARIN BOLUS VIA INFUSION
1000.0000 [IU] | Freq: Once | INTRAVENOUS | Status: AC
  Administered 2017-03-02: 1000 [IU] via INTRAVENOUS
  Filled 2017-03-02: qty 1000

## 2017-03-02 MED ORDER — UMECLIDINIUM-VILANTEROL 62.5-25 MCG/INH IN AEPB
1.0000 | INHALATION_SPRAY | Freq: Every day | RESPIRATORY_TRACT | Status: DC
  Administered 2017-03-03: 1 via RESPIRATORY_TRACT
  Filled 2017-03-02: qty 14

## 2017-03-02 MED ORDER — ACETAMINOPHEN 325 MG PO TABS
650.0000 mg | ORAL_TABLET | Freq: Four times a day (QID) | ORAL | Status: DC | PRN
  Administered 2017-03-02: 650 mg via ORAL
  Filled 2017-03-02: qty 2

## 2017-03-02 MED ORDER — HYDROCODONE-ACETAMINOPHEN 5-325 MG PO TABS: 1.0000 | ORAL_TABLET | ORAL | Status: DC | PRN

## 2017-03-02 MED ORDER — METHADONE HCL 10 MG PO TABS
30.0000 mg | ORAL_TABLET | Freq: Every day | ORAL | Status: DC
  Administered 2017-03-02 – 2017-03-03 (×2): 30 mg via ORAL
  Filled 2017-03-02 (×2): qty 3

## 2017-03-02 MED ORDER — PREDNISONE 50 MG PO TABS
60.0000 mg | ORAL_TABLET | Freq: Every day | ORAL | Status: DC
  Administered 2017-03-03: 60 mg via ORAL
  Filled 2017-03-02: qty 1

## 2017-03-02 MED ORDER — BENZONATATE 100 MG PO CAPS
100.0000 mg | ORAL_CAPSULE | Freq: Three times a day (TID) | ORAL | Status: DC | PRN
  Administered 2017-03-02: 100 mg via ORAL
  Filled 2017-03-02: qty 1

## 2017-03-02 MED ORDER — SODIUM CHLORIDE 0.9 % IV SOLN
INTRAVENOUS | Status: AC
  Administered 2017-03-02: 02:00:00 via INTRAVENOUS

## 2017-03-02 MED ORDER — ALBUTEROL SULFATE (2.5 MG/3ML) 0.083% IN NEBU
2.5000 mg | INHALATION_SOLUTION | Freq: Three times a day (TID) | RESPIRATORY_TRACT | Status: DC
  Administered 2017-03-02 (×2): 2.5 mg via RESPIRATORY_TRACT
  Filled 2017-03-02 (×2): qty 3

## 2017-03-02 MED ORDER — ONDANSETRON HCL 4 MG PO TABS: 4.0000 mg | ORAL_TABLET | Freq: Four times a day (QID) | ORAL | Status: DC | PRN

## 2017-03-02 MED ORDER — SIMVASTATIN 20 MG PO TABS
20.0000 mg | ORAL_TABLET | Freq: Every day | ORAL | Status: DC
  Administered 2017-03-02: 20 mg via ORAL
  Filled 2017-03-02 (×3): qty 1

## 2017-03-02 MED ORDER — METHYLPREDNISOLONE SODIUM SUCC 125 MG IJ SOLR
60.0000 mg | Freq: Two times a day (BID) | INTRAMUSCULAR | Status: AC
  Administered 2017-03-02: 60 mg via INTRAVENOUS
  Filled 2017-03-02: qty 2

## 2017-03-02 MED ORDER — IPRATROPIUM BROMIDE 0.02 % IN SOLN
0.5000 mg | Freq: Four times a day (QID) | RESPIRATORY_TRACT | Status: DC
  Administered 2017-03-02: 0.5 mg via RESPIRATORY_TRACT
  Filled 2017-03-02: qty 2.5

## 2017-03-02 MED ORDER — PREDNISONE 20 MG PO TABS: 40.0000 mg | ORAL_TABLET | Freq: Every day | ORAL | Status: DC

## 2017-03-02 MED ORDER — LEVALBUTEROL HCL 1.25 MG/0.5ML IN NEBU
1.2500 mg | INHALATION_SOLUTION | RESPIRATORY_TRACT | Status: DC | PRN
Start: 2017-03-02 — End: 2017-03-03
  Filled 2017-03-02: qty 0.5

## 2017-03-02 MED ORDER — LORAZEPAM 0.5 MG PO TABS
0.5000 mg | ORAL_TABLET | Freq: Two times a day (BID) | ORAL | Status: DC | PRN
  Administered 2017-03-02 – 2017-03-03 (×2): 0.5 mg via ORAL
  Filled 2017-03-02 (×3): qty 1

## 2017-03-02 MED ORDER — METHYLPREDNISOLONE SODIUM SUCC 125 MG IJ SOLR: 60.0000 mg | Freq: Two times a day (BID) | INTRAMUSCULAR | Status: DC

## 2017-03-02 MED ORDER — WHITE PETROLATUM EX OINT
TOPICAL_OINTMENT | CUTANEOUS | Status: AC
  Administered 2017-03-02: 19:00:00
  Filled 2017-03-02: qty 28.35

## 2017-03-02 MED ORDER — HEPARIN BOLUS VIA INFUSION
1500.0000 [IU] | Freq: Once | INTRAVENOUS | Status: AC
  Administered 2017-03-02: 1500 [IU] via INTRAVENOUS
  Filled 2017-03-02: qty 1500

## 2017-03-02 MED ORDER — POTASSIUM CHLORIDE 10 MEQ/100ML IV SOLN
10.0000 meq | INTRAVENOUS | Status: AC
  Administered 2017-03-02 (×4): 10 meq via INTRAVENOUS
  Filled 2017-03-02 (×4): qty 100

## 2017-03-02 MED ORDER — ONDANSETRON HCL 4 MG/2ML IJ SOLN: 4.0000 mg | Freq: Four times a day (QID) | INTRAMUSCULAR | Status: DC | PRN

## 2017-03-02 MED ORDER — AMLODIPINE BESYLATE 2.5 MG PO TABS
2.5000 mg | ORAL_TABLET | Freq: Every day | ORAL | Status: DC
  Administered 2017-03-02 – 2017-03-03 (×2): 2.5 mg via ORAL
  Filled 2017-03-02 (×2): qty 1

## 2017-03-02 MED ORDER — ACETAMINOPHEN 650 MG RE SUPP: 650.0000 mg | Freq: Four times a day (QID) | RECTAL | Status: DC | PRN

## 2017-03-02 MED ORDER — POTASSIUM CHLORIDE CRYS ER 10 MEQ PO TBCR
10.0000 meq | EXTENDED_RELEASE_TABLET | Freq: Every day | ORAL | Status: DC
  Administered 2017-03-02 – 2017-03-03 (×2): 10 meq via ORAL
  Filled 2017-03-02 (×2): qty 1

## 2017-03-02 MED ORDER — SODIUM CHLORIDE 0.9 % IV SOLN
100.0000 mg | Freq: Two times a day (BID) | INTRAVENOUS | Status: DC
  Administered 2017-03-02: 100 mg via INTRAVENOUS
  Filled 2017-03-02 (×3): qty 100

## 2017-03-02 MED ORDER — ALBUTEROL SULFATE (2.5 MG/3ML) 0.083% IN NEBU
2.5000 mg | INHALATION_SOLUTION | Freq: Four times a day (QID) | RESPIRATORY_TRACT | Status: DC
  Administered 2017-03-03 (×2): 2.5 mg via RESPIRATORY_TRACT
  Filled 2017-03-02 (×3): qty 3

## 2017-03-02 MED ORDER — NICOTINE 21 MG/24HR TD PT24
21.0000 mg | MEDICATED_PATCH | Freq: Every day | TRANSDERMAL | Status: DC
  Administered 2017-03-02 – 2017-03-03 (×2): 21 mg via TRANSDERMAL
  Filled 2017-03-02 (×2): qty 1

## 2017-03-02 MED ORDER — HEPARIN (PORCINE) IN NACL 100-0.45 UNIT/ML-% IJ SOLN
850.0000 [IU]/h | INTRAMUSCULAR | Status: DC
  Administered 2017-03-02: 750 [IU]/h via INTRAVENOUS
  Filled 2017-03-02: qty 250

## 2017-03-02 MED ORDER — ALPRAZOLAM 0.25 MG PO TABS
0.2500 mg | ORAL_TABLET | Freq: Once | ORAL | Status: AC
  Administered 2017-03-02: 0.25 mg via ORAL
  Filled 2017-03-02: qty 1

## 2017-03-02 MED ORDER — SODIUM CHLORIDE 0.9 % IV SOLN
100.0000 mg | Freq: Two times a day (BID) | INTRAVENOUS | Status: DC
  Administered 2017-03-02 – 2017-03-03 (×2): 100 mg via INTRAVENOUS
  Filled 2017-03-02 (×3): qty 100

## 2017-03-02 MED ORDER — FLUOXETINE HCL 20 MG PO CAPS
40.0000 mg | ORAL_CAPSULE | Freq: Every morning | ORAL | Status: DC
  Administered 2017-03-03: 40 mg via ORAL
  Filled 2017-03-02 (×2): qty 2

## 2017-03-02 NOTE — ED Notes (Signed)
Patient denies pain and is resting comfortably.  

## 2017-03-02 NOTE — ED Notes (Signed)
Called for a hospital bed per RN Larkin Ina request

## 2017-03-02 NOTE — Progress Notes (Signed)
Patient ID: Allison Vaughan, female   DOB: [birth year], 72 y.o.   MRN: [id number]                                                                PROGRESS NOTE                                                                                                                                                                                                             Patient Demographics:    Allison Vaughan, is a 72 y.o. female, DOB - [birth year], ZYS:[id number]  Admit date - 03/01/2017   Admitting Physician Allison Baker, MD  Outpatient Primary MD for the patient is Allison Jewel, MD  LOS - 1  Outpatient Specialists:    Chief Complaint  Patient presents with  . Shortness of Breath       Brief Narrative   72 y.o. female with medical history significant of COPD past history of intubation, tobacco abusechronic opiate use, benzodiazepine use, HTN GERD      Presented with an wheezing started today attempted to use nebulizer with some improvement not on oxygen at home requiring 2 L here. REPOTS NO HX OF A.FIB OR CAD. Patient fell down 2 days ago. She turned and fell on her face and chest. She reports injuring her right arm but able to move.  She have been having head aches and feeling light headed.   In January patient presented with COPD exacerbation and mental status changes her family prior to this were weaning her off of Xanax and she was using methadone clinic to come off of chronic opioids she originally presented with confusion difficulty breathing and required intubation extubated on 10 January and discharged the next day.  She is still currently maintained on methadone While in ER: She was given magnesium and potassium And DuoNeb in route with improvement. Reports feeling palpitations while in ER HR went up to 140's with A.fib RVR on the monitor. Converted back to sinus with out intervention.      Subjective:    Allison Vaughan today has convered to sinus rhythm.   Still dyspneic, and wheezing.  Denies fever, chills, cp, palp, n/v, diarrhea, brbpr.   No headache, No chest pain, No abdominal pain - No Nausea, No new weakness tingling or numbness, No  Cough - SOB.    Assessment  & Plan :    Active Problems:   HYPERCHOLESTEROLEMIA   TOBACCO DEPENDENCE   HYPERTENSION, BENIGN SYSTEMIC   GASTROESOPHAGEAL REFLUX, NO ESOPHAGITIS   COPD exacerbation (HCC)   Hypokalemia   COPD with acute exacerbation (HCC)   Atrial fibrillation with RVR (HCC)   Acute on chronic respiratory failure with hypoxia (HCC)   Fall at home, initial encounter   Copd exacerbation Cont Solumedrol 60mg  iv bid Taper to prednisone 60mg  po qday in am Cont Doxycycline 100mg  iv bid DC atrovent neb qid Start Anoro 1puff qday Start albuterol neb q8h Cont xopenex neb q1h prn  Afib with RVR  Vs MAT, awaiting cardiology input Chads2vasc=3 Cont heparin iv for now Consider start on xarelto in am Await  Cardiac echo Appreciate cardiology consult  Hypokalemia repleted Check cmp , magnesium in am  Hyperglycemia Check hga1c If high, start SSI  Nicotine dep Nicotine patch  Hypertension Restart amlodipine at 2.5mg  po qday  Hyperlipidemia Cont simvastatin  Anxiety/depression Cont Fluoxetine  Chronic opioid dep Cont methadone   DVT prophylaxis:  heparin  Code Status:   FULL CODE  as per patient    Family Communication:   d/w pt , no family at bedside  Disposition Plan:   To home once workup is complete and patient is stable                                      Consults called: email cardiology    Admission status:    inpatient       Level of care      SDU     Lab Results  Component Value Date   PLT 136 (L) 03/02/2017    Antibiotics  :  Doxycycline 2/16=>  Anti-infectives (From admission, onward)   Start     Dose/Rate Route Frequency Ordered Stop   03/02/17 0230  doxycycline (VIBRAMYCIN) 100 mg in sodium chloride 0.9 % 250 mL IVPB     100 mg 125  mL/hr over 120 Minutes Intravenous 2 times daily 03/02/17 0146          Objective:   Vitals:   03/02/17 0600 03/02/17 0615 03/02/17 0630 03/02/17 0700  BP: 131/61  (!) 142/59 135/67  Pulse: 78 69 65 67  Resp: 16 15 16 15   Temp:      TempSrc:      SpO2: 92% 93% 91% 93%  Weight:      Height:        Wt Readings from Last 3 Encounters:  03/01/17 44 kg (97 lb)  01/24/17 44.1 kg (97 lb 4.8 oz)  10/29/15 56.2 kg (124 lb)     Intake/Output Summary (Last 24 hours) at 03/02/2017 0748 Last data filed at 03/02/2017 0615 Gross per 24 hour  Intake 1250 ml  Output -  Net 1250 ml     Physical Exam  Awake Alert, Oriented X 3, No new F.N deficits, Normal affect [place].AT,PERRAL Supple Neck,No JVD, No cervical lymphadenopathy appriciated.  Symmetrical Chest wall movement, slightly tight,  + bilateral exp wheezing RRR,No Gallops,Rubs or new Murmurs, No Parasternal Heave +ve B.Sounds, Abd Soft, No tenderness, No organomegaly appriciated, No rebound - guarding or rigidity. No Cyanosis, Clubbing or edema, No new Rash or bruise      Data Review:    CBC Recent Labs  Lab 03/01/17 1841 03/02/17 0335  WBC 5.4  5.3  HGB 11.6* 12.5  HCT 34.1* 36.8  PLT 105* 136*  MCV 90.5 90.0  MCH 30.8 30.6  MCHC 34.0 34.0  RDW 14.1 14.1    Chemistries  Recent Labs  Lab 03/01/17 1841 03/01/17 2003 03/02/17 0335  NA 136  --  131*  K 2.4*  --  3.8  CL 100*  --  94*  CO2 24  --  23  GLUCOSE 106*  --  226*  BUN 10  --  12  CREATININE 0.76  --  0.94  CALCIUM 7.5*  --  8.3*  MG  --  2.2 2.3  AST  --   --  28  ALT  --   --  10*  ALKPHOS  --   --  66  BILITOT  --   --  0.4   ------------------------------------------------------------------------------------------------------------------ No results for input(s): CHOL, HDL, LDLCALC, TRIG, CHOLHDL, LDLDIRECT in the last 72 hours.  No results found for:  HGBA1C ------------------------------------------------------------------------------------------------------------------ Recent Labs    03/02/17 0338  TSH 0.536   ------------------------------------------------------------------------------------------------------------------ No results for input(s): VITAMINB12, FOLATE, FERRITIN, TIBC, IRON, RETICCTPCT in the last 72 hours.  Coagulation profile Recent Labs  Lab 03/02/17 0335  INR 0.96    No results for input(s): DDIMER in the last 72 hours.  Cardiac Enzymes Recent Labs  Lab 03/01/17 2253 03/02/17 0335  TROPONINI <0.03 <0.03   ------------------------------------------------------------------------------------------------------------------    Component Value Date/Time   BNP 69.9 03/01/2017 1841    Inpatient Medications  Scheduled Meds: . FLUoxetine  40 mg Oral q morning - 10a  . guaiFENesin  600 mg Oral BID  . ipratropium  0.5 mg Nebulization QID  . methadone  30 mg Oral Q0600  . methylPREDNISolone (SOLU-MEDROL) injection  60 mg Intravenous Q12H   Followed by  . [START ON 03/03/2017] predniSONE  40 mg Oral Q supper  . nicotine  21 mg Transdermal Daily  . potassium chloride  10 mEq Oral Daily  . simvastatin  20 mg Oral QHS   Continuous Infusions: . sodium chloride 100 mL/hr at 03/02/17 0226  . doxycycline (VIBRAMYCIN) IV Stopped (03/02/17 0615)  . heparin 600 Units/hr (03/02/17 0501)   PRN Meds:.acetaminophen **OR** acetaminophen, HYDROcodone-acetaminophen, levalbuterol, ondansetron **OR** ondansetron (ZOFRAN) IV  Micro Results No results found for this or any previous visit (from the past 240 hour(s)).  Radiology Reports Ct Head Wo Contrast  Result Date: 03/01/2017 CLINICAL DATA:  Altered level of consciousness EXAM: CT HEAD WITHOUT CONTRAST TECHNIQUE: Contiguous axial images were obtained from the base of the skull through the vertex without intravenous contrast. COMPARISON:  01/22/2017 FINDINGS: Brain:  Chronic moderate small vessel ischemic disease. Mild superficial atrophy. No acute intracranial hemorrhage, midline shift or edema. No intra-axial mass nor extra-axial fluid collections. No large vascular territory infarct. No hydrocephalus. Midline fourth ventricle and basal cisterns. Vascular: No hyperdense vessels. Skull: No skull fracture. Sinuses/Orbits: Left lens replacement. Chronic near complete opacification of the right maxillary sinus with desiccated mucus. Other: None IMPRESSION: 1. 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 Mild superficial atrophy. 2. No acute intracranial abnormality. 3. Chronic near complete opacification of the right maxillary sinus. Electronically Signed   By: Ashley Royalty M.D.   On: 03/01/2017 23:57   Dg Chest Portable 1 View  Result Date: 03/01/2017 CLINICAL DATA:  Shortness of breath. EXAM: PORTABLE CHEST 1 VIEW COMPARISON:  01/24/2017 FINDINGS: Normal heart size. Aortic atherosclerosis noted. No pleural effusion or edema identified. No airspace opacities noted. IMPRESSION:  1. No active disease. Electronically Signed   By: Kerby Moors M.D.   On: 03/01/2017 19:22    Time Spent in minutes  30   [name] M.D on 03/02/2017 at 7:48 AM  Between 7am to 7pm - Pager - [phone]    After 7pm go to [email or web address] - password [facility]  [facility] -  Office  [phone]

## 2017-03-02 NOTE — Progress Notes (Signed)
ANTICOAGULATION CONSULT NOTE - Initial Consult  Pharmacy Consult for Heparin  Indication: atrial fibrillation  No Known Allergies  Patient Measurements: Height: 5\' 2"  (157.5 cm) Weight: 97 lb (44 kg) IBW/kg (Calculated) : 50.1  Vital Signs: BP: 153/70 (02/17 1300) Pulse Rate: 73 (02/17 1300)  Labs: Recent Labs    03/01/17 1841 03/01/17 2253 03/02/17 0335 03/02/17 1007 03/02/17 1321  HGB 11.6*  --  12.5  --   --   HCT 34.1*  --  36.8  --   --   PLT 105*  --  136*  --   --   APTT  --   --  32  --   --   LABPROT  --   --  12.7  --   --   INR  --   --  0.96  --   --   HEPARINUNFRC  --   --   --   --  <0.10*  CREATININE 0.76  --  0.94  --   --   TROPONINI  --  <0.03 <0.03 <0.03  --     Estimated Creatinine Clearance: 37 mL/min (by C-G formula based on SCr of 0.94 mg/dL).  Assessment: 72 y/o F here with shortness of breath, atrial fibrillation seen in the ED>>converted back to NSR. Starting heparin per pharmacy. Hgb 11.6. Renal function good. Plts low side at 105>>monitor. PTA meds reviewed.   Hep lvl undetectable - despite 1100 order for heparin not charted on, RN confirmed running at 600 units/hr  Goal of Therapy:  Heparin level 0.3-0.7 units/ml Monitor platelets by anticoagulation protocol: Yes   Plan:  Heparin 1000 unit bolus x 1 Increase heparin drip to 750 units/hr 0000 HL Daily CBC/HL Watch plts/monitor for bleeding  Levester Fresh, PharmD, BCPS, BCCCP Clinical Pharmacist Clinical phone for 03/02/2017 from 1430 - 2300: [phone] If after 2300, please call main pharmacy at: [phone] 03/02/2017 4:59 PM

## 2017-03-02 NOTE — Progress Notes (Signed)
Report received from the the ED. Pt arrive to the unit at 1730 via bed.

## 2017-03-02 NOTE — ED Notes (Signed)
Pt very anxious. Pulling at her IV and O2 lines. Pt states that she has not had her methadone in a day and a half. MD aware. Meds ordered for anxiety. VSS. Will continue to monitor.

## 2017-03-02 NOTE — ED Notes (Signed)
Evaluated patient. Patient up to bathroom with tech.

## 2017-03-02 NOTE — ED Notes (Signed)
Patient placed on hospital floor bed.

## 2017-03-02 NOTE — ED Notes (Signed)
Report attempted 

## 2017-03-02 NOTE — Progress Notes (Signed)
ANTICOAGULATION CONSULT NOTE - Initial Consult  Pharmacy Consult for Heparin  Indication: atrial fibrillation  No Known Allergies  Patient Measurements: Height: 5\' 2"  (157.5 cm) Weight: 97 lb (44 kg) IBW/kg (Calculated) : 50.1  Vital Signs: Temp: 97.5 F (36.4 C) (02/16 1826) Temp Source: Oral (02/16 1826) BP: 121/53 (02/17 0030) Pulse Rate: 78 (02/17 0030)  Labs: Recent Labs    03/01/17 1841 03/01/17 2253  HGB 11.6*  --   HCT 34.1*  --   PLT 105*  --   CREATININE 0.76  --   TROPONINI  --  <0.03    Estimated Creatinine Clearance: 43.5 mL/min (by C-G formula based on SCr of 0.76 mg/dL).   Medical History: Past Medical History:  Diagnosis Date  . Anxiety   . CAP (community acquired pneumonia) 04/18/2014  . Chronic hypoxemic respiratory failure ([place]) 06/26/2012  . Chronic pain   . COPD (chronic obstructive pulmonary disease) ([place])   . DEPRESSIVE DISORDER, NOS 03/13/2006   Qualifier: Diagnosis of  By: Damita Dunnings MD, [place], NO ESOPHAGITIS 03/13/2006   Qualifier: Diagnosis of  By: Damita Dunnings MD, Burt Ek 03/13/2006   Qualifier: Diagnosis of  By: Damita Dunnings MD, Phillip Heal    . HYPERTENSION, BENIGN SYSTEMIC 03/13/2006   Qualifier: Diagnosis of  By: Damita Dunnings MD, Phillip Heal    . Irritable bowel syndrome 03/13/2006   Qualifier: Diagnosis of  By: Damita Dunnings MD, Phillip Heal    . Lung nodule 04/16/2014  . MENOPAUSAL SYNDROME 03/13/2006   Qualifier: Diagnosis of  By: Damita Dunnings MD, Deirdre Evener, [place] SITES 03/13/2006   Qualifier: Diagnosis of  By: Damita Dunnings MD, Isac Caddy DEPENDENCE 03/13/2006   Qualifier: Diagnosis of  By: Damita Dunnings MD, Phillip Heal      Assessment: 72 y/o F here with shortness of breath, atrial fibrillation seen in the ED>>converted back to NSR. Starting heparin per pharmacy. Hgb 11.6. Renal function good. Plts low side at 105>>monitor. PTA meds reviewed.   Goal of Therapy:  Heparin level 0.3-0.7 units/ml Monitor platelets by  anticoagulation protocol: Yes   Plan:  Heparin 1500 units BOLUS (reduced bolus with low platelets) Start heparin drip at 600 units/hr 1100 HL Daily CBC/HL Watch plts/monitor for bleeding  Narda Bonds 03/02/2017,2:20 AM

## 2017-03-02 NOTE — Consult Note (Signed)
Cardiology Consultation:   Patient ID: Allison Vaughan; [id number]; Aug 09, 1945   Admit date: 03/01/2017 Date of Consult: 03/02/2017  Primary Care Provider: Antonietta Jewel, MD Primary Cardiologist: New   Patient Profile:   Allison Vaughan is a 72 y.o. female with a hx of COPD, tobacco abuse, HTN, HLD, chronic opiate use, anxiety and depression, presenting to the ED with SOB, wheezing and productive cough and is being seen today by cardiology for the evaluation of new onset atrial fibrillaton at the request of Dr. Maudie Mercury, Internal Medicine.  History of Present Illness:   Allison Vaughan has a h/o COPD and was recently admitted 01/2017 for acute COPDE and mental status change, requiring intubation. She was extubated on 01/23/17 and discharged the following day, per records. She also has h/o chronic opiate use and has recently been going to a methadone clinic. Pt has also had a recent fall. She fell at home, 2 days ago, and fell on her face and chest. Pt was feeling very faint and dizzy right before fall. No palpitations, dyspnea or CP at the time. This has never happened to her before. No recurrent falls since then.   She presented back to the [facility] ED yesterday for recurrent dyspnea, wheezing and productive cough. She tried using her inhalers at home but little improvement. In the ED, she was given IV steroids and beta agonist therapy with improvement in wheezing. CXR showed no active disease. Given recent fall, head CT was also obtained and negative with no acute intercranial abnormalities. Initial EKG demonstrated newly recognized atrial fibrillation w/ RVR. Repeat EKG with slower rate looks more like MAT. Initial labs notable for severe hypokalemia at 2.4. Mg was WNL at 2.2. Supplemental K was given and repeat BMP showed improvement in K to 3.8. Sodium also low at 131. Glucose elevated at 226. CBC with thrombocytopenia at 136. H/H and WBC WNL. Troponin negative. BNP normal at 69. TSH normal. Respiratory  viral panel pending. On telemetry, pt is now in NSR with a CVR. Echo is pending.   The patient is being admitted by IM for acute COPD exacerbation. Cardiology consulted to assist with afib management. She denies any prior h/o arrhthymias. No exertional CP but does have exertional dyspnea. She notes family h/o CAD. Her father had an MI in his 43s. Pt herself has never had any cardiac w/u.   Pt is feeling much better now. No resting dyspnea.     Past Medical History:  Diagnosis Date  . Anxiety   . CAP (community acquired pneumonia) 04/18/2014  . Chronic hypoxemic respiratory failure ([place]) 06/26/2012  . Chronic pain   . COPD (chronic obstructive pulmonary disease) ([place])   . DEPRESSIVE DISORDER, NOS 03/13/2006   Qualifier: Diagnosis of  By: Damita Dunnings MD, [place], NO ESOPHAGITIS 03/13/2006   Qualifier: Diagnosis of  By: Damita Dunnings MD, Burt Ek 03/13/2006   Qualifier: Diagnosis of  By: Damita Dunnings MD, Phillip Heal    . HYPERTENSION, BENIGN SYSTEMIC 03/13/2006   Qualifier: Diagnosis of  By: Damita Dunnings MD, Phillip Heal    . Irritable bowel syndrome 03/13/2006   Qualifier: Diagnosis of  By: Damita Dunnings MD, Phillip Heal    . Lung nodule 04/16/2014  . MENOPAUSAL SYNDROME 03/13/2006   Qualifier: Diagnosis of  By: Damita Dunnings MD, Deirdre Evener, [place] SITES 03/13/2006   Qualifier: Diagnosis of  By: Damita Dunnings MD, Isac Caddy DEPENDENCE 03/13/2006   Qualifier: Diagnosis of  By: Damita Dunnings  MD, Phillip Heal      Past Surgical History:  Procedure Laterality Date  . EYE SURGERY       Home Medications:  Prior to Admission medications   Medication Sig Start Date End Date Taking? Authorizing Provider  albuterol (PROVENTIL HFA;VENTOLIN HFA) 108 (90 BASE) MCG/ACT inhaler Inhale 2 puffs into the lungs every 6 (six) hours as needed for wheezing. 06/09/12  Yes Oti, Brantley Stage, MD  albuterol (PROVENTIL) (2.5 MG/3ML) 0.083% nebulizer solution Take 3 mLs (2.5 mg total) by nebulization every 2 (two) hours  as needed for wheezing. 04/20/14  Yes Rama, Venetia Maxon, MD  amLODipine (NORVASC) 10 MG tablet Take 1 tablet (10 mg total) by mouth daily. 05/02/14  Yes Kelvin Cellar, MD  FLUoxetine (PROZAC) 40 MG capsule Take 40 mg by mouth every morning.    Yes [provider]  methadone (DOLOPHINE) 1 MG/1ML solution Take 30 mg by mouth daily. [facility].   Yes [provider]  potassium chloride (K-DUR,KLOR-CON) 10 MEQ tablet Take 10 mEq by mouth daily. 02/11/17  Yes [provider]  simvastatin (ZOCOR) 20 MG tablet Take 1 tablet by mouth at bedtime.  02/16/14  Yes [provider]  budesonide (PULMICORT) 0.25 MG/2ML nebulizer solution Take 2 mLs (0.25 mg total) by nebulization 2 (two) times daily. Patient not taking: Reported on 03/01/2017 04/20/14   Rama, Venetia Maxon, MD  ipratropium (ATROVENT) 0.02 % nebulizer solution Take 2.5 mLs (0.5 mg total) by nebulization 4 (four) times daily. Patient not taking: Reported on 03/01/2017 04/20/14   Rama, Venetia Maxon, MD    Inpatient Medications: Scheduled Meds: . FLUoxetine  40 mg Oral q morning - 10a  . guaiFENesin  600 mg Oral BID  . ipratropium  0.5 mg Nebulization QID  . methadone  30 mg Oral Q0600  . methylPREDNISolone (SOLU-MEDROL) injection  60 mg Intravenous Q12H   Followed by  . [START ON 03/03/2017] predniSONE  40 mg Oral Q supper  . nicotine  21 mg Transdermal Daily  . potassium chloride  10 mEq Oral Daily  . simvastatin  20 mg Oral QHS   Continuous Infusions: . sodium chloride 100 mL/hr at 03/02/17 0226  . doxycycline (VIBRAMYCIN) IV Stopped (03/02/17 0615)  . heparin 600 Units/hr (03/02/17 0501)   PRN Meds: acetaminophen **OR** acetaminophen, HYDROcodone-acetaminophen, levalbuterol, ondansetron **OR** ondansetron (ZOFRAN) IV  Allergies:   No Known Allergies  Social History:   Social History   Socioeconomic History  . Marital status: Single    Spouse name: Not on file  . Number of children: Not on  file  . Years of education: Not on file  . Highest education level: Not on file  Social Needs  . Financial resource strain: Not on file  . Food insecurity - worry: Not on file  . Food insecurity - inability: Not on file  . Transportation needs - medical: Not on file  . Transportation needs - non-medical: Not on file  Occupational History  . Not on file  Tobacco Use  . Smoking status: Current Every Day Smoker    Packs/day: 1.00    Types: Cigarettes  . Smokeless tobacco: Never Used  Substance and Sexual Activity  . Alcohol use: No    Frequency: Never    Comment: not in a long time  . Drug use: No  . Sexual activity: Not on file  Other Topics Concern  . Not on file  Social History Narrative  . Not on file    Family History:  Family History  Problem Relation Age of Onset  . CAD Father   . Lung cancer Brother      ROS:  Please see the history of present illness.   All other ROS reviewed and negative.     Physical Exam/Data:   Vitals:   03/02/17 0600 03/02/17 0615 03/02/17 0630 03/02/17 0700  BP: 131/61  (!) 142/59 135/67  Pulse: 78 69 65 67  Resp: 16 15 16 15   Temp:      TempSrc:      SpO2: 92% 93% 91% 93%  Weight:      Height:        Intake/Output Summary (Last 24 hours) at 03/02/2017 0742 Last data filed at 03/02/2017 0615 Gross per 24 hour  Intake 1250 ml  Output -  Net 1250 ml   Filed Weights   03/01/17 1831  Weight: 97 lb (44 kg)   Body mass index is 17.74 kg/m.  General:  Thin appearing female, in no acute distress HEENT: normal Lymph: no adenopathy Neck: no JVD Endocrine:  No thryomegaly Vascular: No carotid bruits; FA pulses 2+ bilaterally without bruits  Cardiac:  normal S1, S2; RRR; no murmur  Lungs:  clear to auscultation bilaterally, no wheezing, rhonchi or rales  Abd: soft, nontender, no hepatomegaly  Ext: no edema Musculoskeletal:  No deformities, BUE and BLE strength normal and equal Skin: warm and dry  Neuro:  CNs 2-12 intact,  no focal abnormalities noted Psych:  Normal affect   EKG:  The EKG was personally reviewed and demonstrates:  Atrial Fibrillation w/ RVR and MAT Telemetry:  Telemetry was personally reviewed and demonstrates:  NSR currently in the 70s  Relevant CV Studies: 2D Echo - pending.   Laboratory Data:  Chemistry Recent Labs  Lab 03/01/17 1841 03/02/17 0335  NA 136 131*  K 2.4* 3.8  CL 100* 94*  CO2 24 23  GLUCOSE 106* 226*  BUN 10 12  CREATININE 0.76 0.94  CALCIUM 7.5* 8.3*  GFRNONAA >60 59*  GFRAA >60 >60  ANIONGAP 12 14    Recent Labs  Lab 03/02/17 0335  PROT 5.5*  ALBUMIN 3.1*  AST 28  ALT 10*  ALKPHOS 66  BILITOT 0.4   Hematology Recent Labs  Lab 03/01/17 1841 03/02/17 0335  WBC 5.4 5.3  RBC 3.77* 4.09  HGB 11.6* 12.5  HCT 34.1* 36.8  MCV 90.5 90.0  MCH 30.8 30.6  MCHC 34.0 34.0  RDW 14.1 14.1  PLT 105* 136*   Cardiac Enzymes Recent Labs  Lab 03/01/17 2253 03/02/17 0335  TROPONINI <0.03 <0.03   No results for input(s): TROPIPOC in the last 168 hours.  BNP Recent Labs  Lab 03/01/17 1841  BNP 69.9    DDimer No results for input(s): DDIMER in the last 168 hours.  Radiology/Studies:  Ct Head Wo Contrast  Result Date: 03/01/2017 CLINICAL DATA:  Altered level of consciousness EXAM: CT HEAD WITHOUT CONTRAST TECHNIQUE: Contiguous axial images were obtained from the base of the skull through the vertex without intravenous contrast. COMPARISON:  01/22/2017 FINDINGS: Brain: Chronic moderate small vessel ischemic disease. Mild superficial atrophy. No acute intracranial hemorrhage, midline shift or edema. No intra-axial mass nor extra-axial fluid collections. No large vascular territory infarct. No hydrocephalus. Midline fourth ventricle and basal cisterns. Vascular: No hyperdense vessels. Skull: No skull fracture. Sinuses/Orbits: Left lens replacement. Chronic near complete opacification of the right maxillary sinus with desiccated mucus. Other: None IMPRESSION:  1. Chronic moderate small vessel ischemic disease of periventricular  subcortical white matter. Mild superficial atrophy. 2. No acute intracranial abnormality. 3. Chronic near complete opacification of the right maxillary sinus. Electronically Signed   By: Ashley Royalty M.D.   On: 03/01/2017 23:57   Dg Chest Portable 1 View  Result Date: 03/01/2017 CLINICAL DATA:  Shortness of breath. EXAM: PORTABLE CHEST 1 VIEW COMPARISON:  01/24/2017 FINDINGS: Normal heart size. Aortic atherosclerosis noted. No pleural effusion or edema identified. No airspace opacities noted. IMPRESSION: 1. No active disease. Electronically Signed   By: Kerby Moors M.D.   On: 03/01/2017 19:22    Assessment and Plan:   1. Atrial Fibrillation/ MAT: initial EKG with appearance of atrial fibrillation w/ RVR, however repeat EKG, once rate improved, more consistent with MAT. Pt is currently in NSR with rates in the 70s (spontaneous improvement in rate/ rhythm w/o treatment with rate control agents). Arrhthymia occurred in the setting of underlying lung disease (COPD) as well as electrolyte deffiency with K at 2.4 on arrival. She has been given supplemental K and levels improved to 3.8. Mg WNL at 2.2. TSH normal. Troponin negative x 1. Cyclic troponinis pending. 2D echo pending. Pt also with recent episode of severe dizziness resulting in a fall several days ago. ? If related to a tachyarrhythmia. Recommend continued monitoring on telemetry. If no documentation of recurrent atrial fibrillation, would recommend outpatient 30 day monitor post discharge for further monitoring. If her rate increases, can consider adding PO Cardizem (d/c amlodipine). Keep K stable. In regards to [facility], may opt for further monitoring to see if recurrent afib, now that her hypokalemia has been corrected. If recurrent atrial fib, in the absence of identifiable triggers, then would recommended addition of anticoagulation for stroke risk reduction. CHA2DS2 VASc score is at  least 3 for HTN, female sex and age 19-74. MD to follow with further recs.   2. COPDE: management per IM.   3. Hypokalemia: unknown cause. Pt denies n/v/d. Does not appear to be on any diuretics at home, based on med list. Mg is WNL at 2.2. Pt has received supplemental K and levels improved from 2.4>>3.8. Monitor closely.   MD to follow with further recommendations.   For questions or updates, please contact [place] Please consult [email or web address] for contact info under Cardiology/STEMI.   Signed, Lyda Jester, PA-C  03/02/2017 7:42 AM

## 2017-03-02 NOTE — ED Notes (Signed)
Regular breakfast tray ordered.  

## 2017-03-02 NOTE — ED Notes (Addendum)
Pt wheeled to RR per request of RN. w/c beside pt in rr, pt requested privacy. Pt sts she will pull call bell when complete. Pt requires 1 staff assists. Weak on feet.

## 2017-03-03 ENCOUNTER — Inpatient Hospital Stay (HOSPITAL_COMMUNITY): Payer: Medicare HMO

## 2017-03-03 DIAGNOSIS — I340 Nonrheumatic mitral (valve) insufficiency: Secondary | ICD-10-CM

## 2017-03-03 DIAGNOSIS — I4891 Unspecified atrial fibrillation: Secondary | ICD-10-CM

## 2017-03-03 DIAGNOSIS — E876 Hypokalemia: Secondary | ICD-10-CM

## 2017-03-03 DIAGNOSIS — I10 Essential (primary) hypertension: Secondary | ICD-10-CM

## 2017-03-03 LAB — COMPREHENSIVE METABOLIC PANEL
ALK PHOS: 74 U/L (ref 38–126)
ALT: 9 U/L — AB (ref 14–54)
AST: 21 U/L (ref 15–41)
Albumin: 3.6 g/dL (ref 3.5–5.0)
Anion gap: 12 (ref 5–15)
BILIRUBIN TOTAL: 0.5 mg/dL (ref 0.3–1.2)
BUN: 7 mg/dL (ref 6–20)
CO2: 24 mmol/L (ref 22–32)
Calcium: 8.8 mg/dL — ABNORMAL LOW (ref 8.9–10.3)
Chloride: 98 mmol/L — ABNORMAL LOW (ref 101–111)
Creatinine, Ser: 0.75 mg/dL (ref 0.44–1.00)
GFR calc Af Amer: >60 mL/min (ref >60)
GFR calc non Af Amer: >60 mL/min (ref >60)
GLUCOSE: 105 mg/dL — AB (ref 65–99)
POTASSIUM: 4 mmol/L (ref 3.5–5.1)
Sodium: 134 mmol/L — ABNORMAL LOW (ref 135–145)
Total Protein: 6.2 g/dL — ABNORMAL LOW (ref 6.5–8.1)

## 2017-03-03 LAB — CBC
HCT: 39.6 % (ref 36.0–46.0)
Hemoglobin: 13.4 g/dL (ref 12.0–15.0)
MCH: 30.5 pg (ref 26.0–34.0)
MCHC: 33.8 g/dL (ref 30.0–36.0)
MCV: 90.2 fL (ref 78.0–100.0)
PLATELETS: 150 10*3/uL (ref 150–400)
RBC: 4.39 MIL/uL (ref 3.87–5.11)
RDW: 14.1 % (ref 11.5–15.5)
WBC: 8.1 10*3/uL (ref 4.0–10.5)

## 2017-03-03 LAB — BASIC METABOLIC PANEL
ANION GAP: 13 (ref 5–15)
BUN: 8 mg/dL (ref 6–20)
CALCIUM: 8.6 mg/dL — AB (ref 8.9–10.3)
CO2: 22 mmol/L (ref 22–32)
Chloride: 97 mmol/L — ABNORMAL LOW (ref 101–111)
Creatinine, Ser: 0.75 mg/dL (ref 0.44–1.00)
GFR calc Af Amer: >60 mL/min (ref >60)
GFR calc non Af Amer: >60 mL/min (ref >60)
GLUCOSE: 131 mg/dL — AB (ref 65–99)
Potassium: 3.5 mmol/L (ref 3.5–5.1)
Sodium: 132 mmol/L — ABNORMAL LOW (ref 135–145)

## 2017-03-03 LAB — ECHOCARDIOGRAM COMPLETE
AOASC: 28 cm
CHL CUP RV SYS PRESS: 25 mmHg
CHL CUP TV REG PEAK VELOCITY: 232 cm/s
FS: 37 % (ref 28–44)
Height: 62 in
IVS/LV PW RATIO, ED: 0.96
LA ID, A-P, ES: 41 mm
LA vol index: 38.3 mL/m2
LA vol: 52.9 mL
LADIAMINDEX: 2.97 cm/m2
LAVOLA4C: 46.1 mL
LDCA: 3.46 cm2
LEFT ATRIUM END SYS DIAM: 41 mm
LVOTD: 21 mm
PW: 9.19 mm — AB (ref 0.6–1.1)
TAPSE: 19.6 mm
TR max vel: 232 cm/s
Weight: 1552 oz

## 2017-03-03 LAB — HEPARIN LEVEL (UNFRACTIONATED)
HEPARIN UNFRACTIONATED: 0.16 [IU]/mL — AB (ref 0.30–0.70)
HEPARIN UNFRACTIONATED: 0.27 [IU]/mL — AB (ref 0.30–0.70)

## 2017-03-03 LAB — HEMOGLOBIN A1C
Hgb A1c MFr Bld: 5 % (ref 4.8–5.6)
MEAN PLASMA GLUCOSE: 96.8 mg/dL

## 2017-03-03 LAB — MAGNESIUM
Magnesium: 1.9 mg/dL (ref 1.7–2.4)
Magnesium: 1.9 mg/dL (ref 1.7–2.4)

## 2017-03-03 MED ORDER — PREDNISONE 20 MG PO TABS: 40.0000 mg | ORAL_TABLET | Freq: Every day | ORAL | 0 refills | Status: AC

## 2017-03-03 MED ORDER — HEPARIN SODIUM (PORCINE) 5000 UNIT/ML IJ SOLN
5000.0000 [IU] | Freq: Three times a day (TID) | INTRAMUSCULAR | Status: DC
  Filled 2017-03-03: qty 1

## 2017-03-03 MED ORDER — BENZONATATE 100 MG PO CAPS: 100.0000 mg | ORAL_CAPSULE | Freq: Three times a day (TID) | ORAL | 0 refills | Status: DC | PRN

## 2017-03-03 MED ORDER — ENSURE ENLIVE PO LIQD: 237.0000 mL | Freq: Two times a day (BID) | ORAL | 12 refills | Status: DC

## 2017-03-03 MED ORDER — GUAIFENESIN ER 600 MG PO TB12: 600.0000 mg | ORAL_TABLET | Freq: Two times a day (BID) | ORAL | 0 refills | Status: DC

## 2017-03-03 NOTE — Progress Notes (Signed)
  Echocardiogram 2D Echocardiogram has been performed.  Allison Vaughan 03/03/2017, 11:23 AM

## 2017-03-03 NOTE — Evaluation (Signed)
Occupational Therapy Evaluation Patient Details Name: Allison Vaughan MRN: [id number] DOB: 08/15/1945 Today's Date: 03/03/2017    History of Present Illness AKAYLAH Vaughan is a 72 y.o. female with medical history significant of COPD past history of intubation, tobacco abuse chronic opiate use, benzodiazepine use, HTN GERD. Fell PTA and hit head and chest. Went into A-fib in ED.    Clinical Impression   This 72 y/o F presents with the above. At baseline pt is independent with ADLs and functional mobility, though reports increased number of falls recently at home. Pt presenting with generalized weakness and decreased activity tolerance; completing room level functional mobility with overall MinGuard, intermittent minA without AD, though pt often seeking out UE support during mobility. Pt currently requires MinGuard for LB ADLs. Pt will benefit from continued acute OT services to maximize her overall activity tolerance, safety and independence with ADLs and functional mobility.     Follow Up Recommendations  No OT follow up;Supervision/Assistance - 24 hour    Equipment Recommendations  3 in 1 bedside commode           Precautions / Restrictions Precautions Precautions: Fall Restrictions Weight Bearing Restrictions: No      Mobility Bed Mobility Overal bed mobility: Needs Assistance Bed Mobility: Supine to Sit     Supine to sit: Supervision     General bed mobility comments: HOB slightly elevated, supervision for safety  Transfers Overall transfer level: Needs assistance Equipment used: None Transfers: Sit to/from Stand Sit to Stand: Supervision         General transfer comment: supervision for safety    Balance Overall balance assessment: History of Falls;Needs assistance Sitting-balance support: Feet supported Sitting balance-Leahy Scale: Good     Standing balance support: During functional activity;Single extremity supported Standing balance-Leahy Scale:  Fair Standing balance comment: pt able to maintain static standing without UE support, though often seeking out UE support for increased stability                            ADL either performed or assessed with clinical judgement   ADL Overall ADL's : Needs assistance/impaired Eating/Feeding: Modified independent;Sitting   Grooming: Min guard;Standing   Upper Body Bathing: Min guard;Sitting   Lower Body Bathing: Min guard;Sit to/from stand   Upper Body Dressing : Set up;Sitting   Lower Body Dressing: Min guard;Sit to/from stand   Toilet Transfer: Min guard;Minimal assistance;Ambulation;Regular Museum/gallery exhibitions officer and Hygiene: Min guard;Sit to/from stand       Functional mobility during ADLs: Min guard;Minimal assistance General ADL Comments: education provided on safety and fall reduction at home as well as energy conservation techniques; pt completing room level functional mobility with HR up to 130 after returning to sitting in recliner; did not use AD for mobility however pt reaching out for furniture during mobility and with some unsteadiness noted; educated on uses of 3:1 during toileting and shower task for increased safety/stability                          Pertinent Vitals/Pain Pain Assessment: No/denies pain          Extremity/Trunk Assessment Upper Extremity Assessment Upper Extremity Assessment: Generalized weakness   Lower Extremity Assessment Lower Extremity Assessment: Defer to PT evaluation       Communication Communication Communication: No difficulties   Cognition Arousal/Alertness: Awake/alert Behavior During Therapy: WFL for tasks assessed/performed Overall  Cognitive Status: Within Functional Limits for tasks assessed                                                      Home Living Family/patient expects to be discharged to:: Private residence Living Arrangements:  Children Available Help at Discharge: Family Type of Home: House Home Access: Stairs to enter Technical brewer of Steps: 2   Home Layout: One level     Bathroom Shower/Tub: Teacher, early years/pre: [place]: Environmental consultant - 2 wheels          Prior Functioning/Environment Level of Independence: Independent        Comments: Pt reports multiple falls in the past 6 months        OT Problem List: Decreased strength;Decreased activity tolerance;Impaired balance (sitting and/or standing)      OT Treatment/Interventions: Self-care/ADL training;DME and/or AE instruction;Therapeutic activities;Balance training;Therapeutic exercise;Energy conservation;Patient/family education    OT Goals(Current goals can be found in the care plan section) Acute Rehab OT Goals Patient Stated Goal: return home OT Goal Formulation: With patient Time For Goal Achievement: 03/17/17 Potential to Achieve Goals: Good  OT Frequency: Min 2X/week                             AM-PAC PT "6 Clicks" Daily Activity     Outcome Measure Help from another person eating meals?: None Help from another person taking care of personal grooming?: None Help from another person toileting, which includes using toliet, bedpan, or urinal?: A Little Help from another person bathing (including washing, rinsing, drying)?: A Little Help from another person to put on and taking off regular upper body clothing?: None Help from another person to put on and taking off regular lower body clothing?: A Little 6 Click Score: 21   End of Session Nurse Communication: Mobility status  Activity Tolerance: Patient tolerated treatment well Patient left: in chair;with call bell/phone within reach;with chair alarm set  OT Visit Diagnosis: Unsteadiness on feet (R26.81);History of falling (Z91.81)                Time: 5852-7782 OT Time Calculation (min): 18 min Charges:  OT General Charges $OT  Visit: 1 Visit OT Evaluation $OT Eval Low Complexity: 1 Low G-Codes:     Lou Cal, OT Pager [phone] 03/03/2017   Raymondo Band 03/03/2017, 1:06 PM

## 2017-03-03 NOTE — Progress Notes (Signed)
ANTICOAGULATION CONSULT NOTE   Pharmacy Consult for Heparin  Indication: atrial fibrillation  No Known Allergies  Patient Measurements: Height: 5\' 2"  (157.5 cm) Weight: 97 lb (44 kg) IBW/kg (Calculated) : 50.1  Vital Signs: Temp: 99 F (37.2 C) (02/17 2322) Temp Source: Oral (02/17 2322) BP: 160/74 (02/17 2322) Pulse Rate: 76 (02/17 2322)  Labs: Recent Labs    03/01/17 1841 03/01/17 2253 03/02/17 0335 03/02/17 1007 03/02/17 1321 03/02/17 2352  HGB 11.6*  --  12.5  --   --  13.4  HCT 34.1*  --  36.8  --   --  39.6  PLT 105*  --  136*  --   --  150  APTT  --   --  32  --   --   --   LABPROT  --   --  12.7  --   --   --   INR  --   --  0.96  --   --   --   HEPARINUNFRC  --   --   --   --  <0.10* 0.16*  CREATININE 0.76  --  0.94  --   --   --   TROPONINI  --  <0.03 <0.03 <0.03  --   --     Estimated Creatinine Clearance: 37 mL/min (by C-G formula based on SCr of 0.94 mg/dL).  Assessment: 72 y/o F here with shortness of breath, atrial fibrillation seen in the ED>>converted back to NSR. Starting heparin per pharmacy. Hgb 11.6. Renal function good. Plts low side at 105>>monitor. PTA meds reviewed.   Update 2/18 AM: Plts up to 150, Hgb good, heparin level low but trending up  Goal of Therapy:  Heparin level 0.3-0.7 units/ml Monitor platelets by anticoagulation protocol: Yes   Plan:  Inc heparin to 850 units/hr 0900 HL  Narda Bonds, PharmD, BCPS Clinical Pharmacist Phone: [phone]

## 2017-03-03 NOTE — Progress Notes (Signed)
Pt discharged to home. PIV removed, AVS reviewed. DME delivered to room prior to dc. Pt left unit via wheelchair, belongings in hand. Pt to be transported home by daughter with outpatient follow up in a week.
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
## 2017-03-03 NOTE — Care Management Note (Addendum)
Case Management Note  Patient Details  Name: Allison Vaughan MRN: [id number] Date of Birth: 07/12/1945  Subjective/Objective:    COPD               Action/Plan: 03/03/2017 - Patient lives at home; PCP Dr Antonietta Jewel; has private insurance with [facility] Medicare with prescription drug coverage; CM will continue to follow for progression of care.  2:22 pm- Patient is requesting a rolling walker, 3:1 and a nebulizer machine for home; Butch Penny with [facility] called for arrangements; equipment is to be delivered to her room today prior to discharging home; Patient stated that she does not want any HHC at this time, she knows to contact her PCP  if she want [facility] services after discharge and he can make arrangements from the office. Also referral made to [facility] EMMI program for COPD; B Tamera Punt RN,CM  01/23/2017 - Subjective/Objective:  Pt admitted with AMS- resp. Failure- intubated for airway protection- per MD note- history of chronic opiate use, benzodiazepine use, who is been weaned off Xanax over the last 2 weeks by her family and has been using the methadone clinic to come off chronic opioids over the last 4 days. Kenn File RN,CM Action/Plan: PTA Pt lived at home with family- CM to follow for transition of care needs. Kenn File RN,CM  Expected Discharge Date:    possibly 03/08/2017              Expected Discharge Plan:  [place]  Discharge planning Services  CM Consult    Status of Service:  In process, will continue to follow  Sherrilyn Rist [phone] 03/03/2017, 10:05 AM

## 2017-03-03 NOTE — Discharge Summary (Signed)
Physician Discharge Summary  Allison Vaughan DGL:[id number] DOB: 01/23/1945 DOA: 03/01/2017  PCP: Antonietta Jewel, MD  Admit date: 03/01/2017 Discharge date: 03/03/2017  Admitted From: Home Disposition:  Home  Recommendations for Outpatient Follow-up:  1. Follow up with PCP in 1 week with repeat CBC/BMP 2. Follow-up with pulmonary as an outpatient 3. Follow-up with cardiology as an outpatient   Home Health: Yes Equipment/Devices: None  Discharge Condition: Stable  CODE STATUS: Full  Diet recommendation: Heart Healthy   Brief/Interim Summary: 72 year old female with history of COPD, past history of intubation, tobacco abuse, chronic opiate use, benzodiazepine use, hypertension, GERD presented with worsening shortness of breath.  She was admitted with COPD exacerbation and was also found to have paroxysmal atrial fibrillation versus multifocal atrial tachycardia.  She was started on intravenous steroids and heparin drip.  Cardiology was consulted.  Patient's condition improved.  Her heart rate has improved.  Cardiology was of the opinion that patient had probably paroxysmal atrial fibrillation versus multifocal atrial tachycardia which converted to sinus rhythm.  Cardiology did not recommend anticoagulants and at this time but if patient's atrial fibrillation would recur, patient would need anticoagulation.  Patient feels better and wants to go home.  Discharge Diagnoses:  Active Problems:   HYPERCHOLESTEROLEMIA   TOBACCO DEPENDENCE   HYPERTENSION, BENIGN SYSTEMIC   GASTROESOPHAGEAL REFLUX, NO ESOPHAGITIS   COPD exacerbation (HCC)   Hypokalemia   COPD with acute exacerbation (HCC)   Atrial fibrillation with RVR (HCC)   Acute on chronic respiratory failure with hypoxia (HCC)   Fall at home, initial encounter  COPD exacerbation -Initially started on intravenous Solu-Medrol.  Currently much improved with very minimal wheezing.  Discharge patient home on prednisone 40 mg p.o. daily for  7 days.  Continue budesonide and albuterol on discharge. -Patient would benefit from outpatient evaluation by pulmonary -Patient should stop smoking  Paroxysmal atrial fibrillation versus multifocal atrial tachycardia -Cardiology evaluation appreciated.  Patient is currently in sinus rhythm with intermittent sinus tachycardia -Cardiology recommends no anticoagulation for now but if atrial fibrillation  would recur, patient would need anticoagulation as an outpatient -Echo showed EF of 40-45% -Outpatient follow-up with cardiology  Hypokalemia -Continue potassium supplementation as an outpatient.  Hypertension -Continue amlodipine.  Outpatient follow-up  Hyperlipidemia -Continue simvastatin  Anxiety/depression -Continue fluoxetine  Chronic opioid dependence -Continue methadone.  Outpatient follow-up with primary care provider and/or pain management provider  Discharge Instructions  Discharge Instructions    Ambulatory referral to Cardiology   Complete by:  As directed    Paroxysmal Afib   Ambulatory referral to Pulmonology   Complete by:  As directed    COPD exacerbation/recent hospitalization   Call MD for:  difficulty breathing, headache or visual disturbances   Complete by:  As directed    Call MD for:  extreme fatigue   Complete by:  As directed    Call MD for:  hives   Complete by:  As directed    Call MD for:  persistant dizziness or light-headedness   Complete by:  As directed    Call MD for:  persistant nausea and vomiting   Complete by:  As directed    Call MD for:  severe uncontrolled pain   Complete by:  As directed    Call MD for:  temperature >100.4   Complete by:  As directed    Diet - low sodium heart healthy   Complete by:  As directed    Home Health   Complete by:  As  directed    To provide the following care/treatments:  RN   Increase activity slowly   Complete by:  As directed      Allergies as of 03/03/2017   No Known Allergies      Medication List    TAKE these medications   albuterol 108 (90 Base) MCG/ACT inhaler Commonly known as:  PROVENTIL HFA;VENTOLIN HFA Inhale 2 puffs into the lungs every 6 (six) hours as needed for wheezing.   albuterol (2.5 MG/3ML) 0.083% nebulizer solution Commonly known as:  PROVENTIL Take 3 mLs (2.5 mg total) by nebulization every 2 (two) hours as needed for wheezing.   amLODipine 10 MG tablet Commonly known as:  NORVASC Take 1 tablet (10 mg total) by mouth daily.   benzonatate 100 MG capsule Commonly known as:  TESSALON Take 1 capsule (100 mg total) by mouth 3 (three) times daily as needed for cough.   budesonide 0.25 MG/2ML nebulizer solution Commonly known as:  PULMICORT Take 2 mLs (0.25 mg total) by nebulization 2 (two) times daily.   feeding supplement (ENSURE ENLIVE) Liqd Take 237 mLs by mouth 2 (two) times daily between meals.   FLUoxetine 40 MG capsule Commonly known as:  PROZAC Take 40 mg by mouth every morning.   guaiFENesin 600 MG 12 hr tablet Commonly known as:  MUCINEX Take 1 tablet (600 mg total) by mouth 2 (two) times daily.   ipratropium 0.02 % nebulizer solution Commonly known as:  ATROVENT Take 2.5 mLs (0.5 mg total) by nebulization 4 (four) times daily.   methadone 1 MG/1ML solution Commonly known as:  DOLOPHINE Take 30 mg by mouth daily. [facility].   potassium chloride 10 MEQ tablet Commonly known as:  K-DUR,KLOR-CON Take 10 mEq by mouth daily.   predniSONE 20 MG tablet Commonly known as:  DELTASONE Take 2 tablets (40 mg total) by mouth daily with supper for 7 days.   simvastatin 20 MG tablet Commonly known as:  ZOCOR Take 1 tablet by mouth at bedtime.            Durable Medical Equipment  (From admission, onward)        Start     Ordered   03/03/17 1420  For home use only DME Walker rolling  Once    Question:  Patient needs a walker to treat with the following condition  Answer:  COPD (chronic obstructive pulmonary  disease) ([place])   03/03/17 1419   03/03/17 1419  For home use only DME 3 n 1  Once     03/03/17 1419   03/03/17 1419  For home use only DME Nebulizer/meds  Once    Question:  Patient needs a nebulizer to treat with the following condition  Answer:  COPD (chronic obstructive pulmonary disease) ([place])   03/03/17 1419     Follow-up [place], Sami, MD. Schedule an appointment as soon as possible for a visit in 1 week(s).   Specialty:  Internal Medicine Why:  with repeat CBC/BMP Contact information: [address] [place] [address] [phone]          No Known Allergies  Consultations:  Cardiology   Procedures/Studies: Ct Head Wo Contrast  Result Date: 03/01/2017 CLINICAL DATA:  Altered level of consciousness EXAM: CT HEAD WITHOUT CONTRAST TECHNIQUE: Contiguous axial images were obtained from the base of the skull through the vertex without intravenous contrast. COMPARISON:  01/22/2017 FINDINGS: Brain: Chronic moderate small vessel ischemic disease. Mild superficial atrophy. No  acute intracranial hemorrhage, midline shift or edema. No intra-axial mass nor extra-axial fluid collections. No large vascular territory infarct. No hydrocephalus. Midline fourth ventricle and basal cisterns. Vascular: No hyperdense vessels. Skull: No skull fracture. Sinuses/Orbits: Left lens replacement. Chronic near complete opacification of the right maxillary sinus with desiccated mucus. Other: None IMPRESSION: 1. 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 Mild superficial atrophy. 2. No acute intracranial abnormality. 3. Chronic near complete opacification of the right maxillary sinus. Electronically Signed   By: Ashley Royalty M.D.   On: 03/01/2017 23:57   Dg Chest Portable 1 View  Result Date: 03/01/2017 CLINICAL DATA:  Shortness of breath. EXAM: PORTABLE CHEST 1 VIEW COMPARISON:  01/24/2017 FINDINGS: Normal heart size. Aortic  atherosclerosis noted. No pleural effusion or edema identified. No airspace opacities noted. IMPRESSION: 1. No active disease. Electronically Signed   By: Kerby Moors M.D.   On: 03/01/2017 19:22    Study Conclusions  - Left ventricle: The cavity size was normal. Systolic function was   mildly to moderately reduced. The estimated ejection fraction was   in the range of 40% to 45%. Diffuse hypokinesis. - Aortic valve: Trileaflet; normal thickness leaflets. There was no   regurgitation. - Aortic root: The aortic root was normal in size. - Mitral valve: There was mild regurgitation. - Left atrium: The atrium was mildly dilated. - Right ventricle: The cavity size was normal. Wall thickness was   normal. Systolic function was normal. - Tricuspid valve: There was mild regurgitation. - Pulmonary arteries: Systolic pressure was within the normal   range. - Inferior vena cava: The vessel was normal in size. - Pericardium, extracardiac: There was no pericardial effusion.  Impressions:  - There is mild diffuse hypokinesis, this is possibly secondary to   tachycardia during the acquisitions.   Subjective: Patient seen and examined at bedside.  She feels much better.  She was to go home.  No overnight fever, nausea or vomiting.  Discharge Exam: Vitals:   03/03/17 1153 03/03/17 1316  BP:  (!) 153/66  Pulse:  74  Resp:  18  Temp:  98.5 F (36.9 C)  SpO2: 96% 94%   Vitals:   03/03/17 0842 03/03/17 0905 03/03/17 1153 03/03/17 1316  BP:  (!) 156/77  (!) 153/66  Pulse:    74  Resp:    18  Temp:    98.5 F (36.9 C)  TempSrc:    Oral  SpO2: 95%  96% 94%  Weight:      Height:        General: Pt is alert, awake, not in acute distress Cardiovascular: Rate controlled, S1/S2 + Respiratory: Bilateral decreased breath sounds at bases with very minimal wheezing abdominal: Soft, NT, ND, bowel sounds + Extremities: no edema, no cyanosis   The results of significant diagnostics from  this hospitalization (including imaging, microbiology, ancillary and laboratory) are listed below for reference.     Microbiology: Recent Results (from the past 240 hour(s))  Respiratory Panel by PCR     Status: Abnormal   Collection Time: 03/01/17  8:45 PM  Result Value Ref Range Status   Adenovirus NOT DETECTED NOT DETECTED Final   Coronavirus 229E NOT DETECTED NOT DETECTED Final   Coronavirus HKU1 NOT DETECTED NOT DETECTED Final   Coronavirus NL63 NOT DETECTED NOT DETECTED Final   Coronavirus OC43 NOT DETECTED NOT DETECTED Final   Metapneumovirus NOT DETECTED NOT DETECTED Final   Rhinovirus / Enterovirus NOT DETECTED NOT DETECTED Final  Influenza A NOT DETECTED NOT DETECTED Final   Influenza B NOT DETECTED NOT DETECTED Final   Parainfluenza Virus 1 NOT DETECTED NOT DETECTED Final   Parainfluenza Virus 2 NOT DETECTED NOT DETECTED Final   Parainfluenza Virus 3 NOT DETECTED NOT DETECTED Final   Parainfluenza Virus 4 NOT DETECTED NOT DETECTED Final   Respiratory Syncytial Virus DETECTED (A) NOT DETECTED Final    Comment: CRITICAL RESULT CALLED TO, READ BACK BY AND VERIFIED WITH: J ALLEN,RN AT 1227 03/02/17 BY L BENFIELD    Bordetella pertussis NOT DETECTED NOT DETECTED Final   Chlamydophila pneumoniae NOT DETECTED NOT DETECTED Final   Mycoplasma pneumoniae NOT DETECTED NOT DETECTED Final    Comment: Performed at [facility], [place] [address], [place], [place] [address]     Labs: BNP (last 3 results) Recent Labs    01/22/17 0746 03/01/17 1841  BNP 311.4* 76.1   Basic Metabolic Panel: Recent Labs  Lab 03/01/17 1841 03/01/17 2003 03/02/17 0335 03/02/17 2352 03/03/17 0759  NA 136  --  131* 134* 132*  K 2.4*  --  3.8 4.0 3.5  CL 100*  --  94* 98* 97*  CO2 24  --  23 24 22   GLUCOSE 106*  --  226* 105* 131*  BUN 10  --  12 7 8   CREATININE 0.76  --  0.94 0.75 0.75  CALCIUM 7.5*  --  8.3* 8.8* 8.6*  MG  --  2.2 2.3 1.9 1.9  PHOS  --   --  2.3*  --   --    Liver  Function Tests: Recent Labs  Lab 03/02/17 0335 03/02/17 2352  AST 28 21  ALT 10* 9*  ALKPHOS 66 74  BILITOT 0.4 0.5  PROT 5.5* 6.2*  ALBUMIN 3.1* 3.6   No results for input(s): LIPASE, AMYLASE in the last 168 hours. No results for input(s): AMMONIA in the last 168 hours. CBC: Recent Labs  Lab 03/01/17 1841 03/02/17 0335 03/02/17 2352  WBC 5.4 5.3 8.1  HGB 11.6* 12.5 13.4  HCT 34.1* 36.8 39.6  MCV 90.5 90.0 90.2  PLT 105* 136* 150   Cardiac Enzymes: Recent Labs  Lab 03/01/17 2253 03/02/17 0335 03/02/17 1007  TROPONINI <0.03 <0.03 <0.03   BNP: Invalid input(s): POCBNP CBG: No results for input(s): GLUCAP in the last 168 hours. D-Dimer No results for input(s): DDIMER in the last 72 hours. Hgb A1c Recent Labs    03/02/17 2352  HGBA1C 5.0   Lipid Profile No results for input(s): CHOL, HDL, LDLCALC, TRIG, CHOLHDL, LDLDIRECT in the last 72 hours. Thyroid function studies Recent Labs    03/02/17 0338  TSH 0.536   Anemia work up No results for input(s): VITAMINB12, FOLATE, FERRITIN, TIBC, IRON, RETICCTPCT in the last 72 hours. Urinalysis    Component Value Date/Time   COLORURINE YELLOW 01/22/2017 [place] 01/22/2017 1245   LABSPEC 1.014 01/22/2017 1245   PHURINE 5.0 01/22/2017 1245   GLUCOSEU NEGATIVE 01/22/2017 1245   HGBUR NEGATIVE 01/22/2017 1245   BILIRUBINUR NEGATIVE 01/22/2017 1245   KETONESUR NEGATIVE 01/22/2017 1245   PROTEINUR 30 (A) 01/22/2017 1245   UROBILINOGEN 0.2 04/30/2014 2050   NITRITE NEGATIVE 01/22/2017 1245   LEUKOCYTESUR NEGATIVE 01/22/2017 1245   Sepsis Labs Invalid input(s): PROCALCITONIN,  WBC,  LACTICIDVEN Microbiology Recent Results (from the past 240 hour(s))  Respiratory Panel by PCR     Status: Abnormal   Collection Time: 03/01/17  8:45 PM  Result Value Ref  Range Status   Adenovirus NOT DETECTED NOT DETECTED Final   Coronavirus 229E NOT DETECTED NOT DETECTED Final   Coronavirus HKU1 NOT DETECTED NOT  DETECTED Final   Coronavirus NL63 NOT DETECTED NOT DETECTED Final   Coronavirus OC43 NOT DETECTED NOT DETECTED Final   Metapneumovirus NOT DETECTED NOT DETECTED Final   Rhinovirus / Enterovirus NOT DETECTED NOT DETECTED Final   Influenza A NOT DETECTED NOT DETECTED Final   Influenza B NOT DETECTED NOT DETECTED Final   Parainfluenza Virus 1 NOT DETECTED NOT DETECTED Final   Parainfluenza Virus 2 NOT DETECTED NOT DETECTED Final   Parainfluenza Virus 3 NOT DETECTED NOT DETECTED Final   Parainfluenza Virus 4 NOT DETECTED NOT DETECTED Final   Respiratory Syncytial Virus DETECTED (A) NOT DETECTED Final    Comment: CRITICAL RESULT CALLED TO, READ BACK BY AND VERIFIED WITH: J ALLEN,RN AT 1227 03/02/17 BY L BENFIELD    Bordetella pertussis NOT DETECTED NOT DETECTED Final   Chlamydophila pneumoniae NOT DETECTED NOT DETECTED Final   Mycoplasma pneumoniae NOT DETECTED NOT DETECTED Final    Comment: Performed at [facility], [place] [address], [place], [place] [address]     Time coordinating discharge: 35 minutes  SIGNED:   Aline August, MD  [facility] 03/03/2017, 3:31 PM Pager: [phone]  If 7PM-7AM, please contact night-coverage [email or web address] Password [email or web address]

## 2017-03-03 NOTE — Progress Notes (Signed)
Initial Nutrition Assessment  DOCUMENTATION CODES:   Underweight, Severe malnutrition in context of chronic illness  INTERVENTION:   Ensure Enlive po BID, each supplement provides 350 kcal and 20 grams of protein  NUTRITION DIAGNOSIS:   Severe Malnutrition related to chronic illness(COPD) as evidenced by severe fat depletion, severe muscle depletion, percent weight loss.  GOAL:   Patient will meet greater than or equal to 90% of their needs  MONITOR:   PO intake, Supplement acceptance  REASON FOR ASSESSMENT:   Consult COPD Protocol  ASSESSMENT:   Pt with PMH of COPD with past intubation, chronic tobacco abuse, chronic opiate and benzodiazepine use, anxiety/depressionHTN and GERD admitted today with COPD exacerbation started on steroids.    Per pt she got sick last July 2018 and lost 23 lb (19% of her body weight). Her usual weight was 120 lb. Chart review confirms this. She has not lost any further weight but has been unable to gain weight. She has a poor appetite. Lives with daughter and her friend. They do the cooking and pt now requires a walker to get around.  Pt reports noticing a lot of muscle loss.  She doesn't eat much but does not clarify. States that when she gets food in front of her she loses her appetite. She is drinking 1 chocolate boost per day.  Medications and labs reviewed    NUTRITION - FOCUSED PHYSICAL EXAM:    Most Recent Value  Orbital Region  Moderate depletion  Upper Arm Region  Moderate depletion  Thoracic and Lumbar Region  Severe depletion  Buccal Region  Moderate depletion  Temple Region  Moderate depletion  Clavicle Bone Region  Severe depletion  Clavicle and Acromion Bone Region  Severe depletion  Scapular Bone Region  Severe depletion  Dorsal Hand  Severe depletion  Patellar Region  Moderate depletion  Anterior Thigh Region  Moderate depletion  Posterior Calf Region  Moderate depletion  Edema (RD Assessment)  None  Hair  Reviewed   Eyes  Reviewed  Mouth  Reviewed  Skin  Reviewed  Nails  Reviewed       Diet Order:  Diet regular Room service appropriate? Yes; Fluid consistency: Thin Diet - low sodium heart healthy  EDUCATION NEEDS:   Education needs have been addressed  Skin:  Skin Assessment: Reviewed RN Assessment  Last BM:  2/18  Height:   Ht Readings from Last 1 Encounters:  03/01/17 5\' 2"  (1.575 m)    Weight:   Wt Readings from Last 1 Encounters:  03/01/17 97 lb (44 kg)    Ideal Body Weight:  50 kg  BMI:  Body mass index is 17.74 kg/m.  Estimated Nutritional Needs:   Kcal:  1300-1500  Protein:  60-70 grams  Fluid:  > 1.5 L/day  Maylon Peppers RD, LDN, CNSC [phone] Pager [phone] After Hours Pager

## 2017-03-06 ENCOUNTER — Ambulatory Visit: Payer: Medicare HMO | Admitting: Family Medicine

## 2017-03-06 ENCOUNTER — Other Ambulatory Visit: Payer: Self-pay | Admitting: *Deleted

## 2017-03-06 DIAGNOSIS — Z0289 Encounter for other administrative examinations: Secondary | ICD-10-CM

## 2017-03-06 NOTE — Patient Outreach (Signed)
[facility]) Care Management  03/06/2017  NAOMEE NOWLAND 1945/07/20 [id number]  EMMI- COPD RED ON EMMI ALERT DAY#: 1 DATE: 03/05/17 RED ALERT: # of times rescue inhaler used in past 24 hours? 3 Scheduled a follow-up appointment? No   Outreach attempt #1 to patient. No answer. RN CM left HIPAA compliant message along with contact info.    Plan: RN CM will contact patient within one week.    Lake Bells, RN, BSN, MHA/MSL, [place] Telephonic Care Manager Coordinator [facility] Direct Phone: [phone] Toll Free: [phone] Fax: [phone]

## 2017-03-07 ENCOUNTER — Ambulatory Visit: Payer: Self-pay | Admitting: *Deleted

## 2017-03-07 ENCOUNTER — Other Ambulatory Visit: Payer: Self-pay | Admitting: *Deleted

## 2017-03-07 NOTE — Patient Outreach (Signed)
[facility] [facility]) Care Management  03/07/2017  FINDLEY BLANKENBAKER February 28, 1945 [id number]  EMMI- COPD RED ON EMMI ALERT DAY#: 1 DATE: 03/05/17 RED ALERT: # of times rescue inhaler used in past 24 hours? 3 Scheduled a follow-up appointment? No   Outreach attempt #2 to patient. No answer. RN CM left HIPAA compliant message along with contact info.   Plan: RN CM will send unsuccessful outreach letter to patient and will close case if no response from patient within 10 business days.   Lake Bells, RN, BSN, MHA/MSL, [place] Telephonic Care Manager Coordinator [facility] Direct Phone: [phone] Toll Free: [phone] Fax: [phone]

## 2017-03-08 ENCOUNTER — Encounter: Payer: Self-pay | Admitting: *Deleted

## 2017-03-13 ENCOUNTER — Other Ambulatory Visit: Payer: Self-pay | Admitting: *Deleted

## 2017-03-13 NOTE — Patient Outreach (Signed)
[facility]) Care Management  03/13/2017  ADY HEIMANN [birth year] [id number]   EMMI Alert Follow Up  EMMI:  COPD Red on EMMI Alert EMMI Day:  #8 Date:  03/12/2017 Red Alert Reason:  Feeling worse overall? Yes; Harder to breathe? Yes;  More chest pain than yesterday? Yes; More mucus or thicker mucus? Yes; Mucus change color? Yes; # of times rescue inhaler used in past 24 hours? 5   Outreach Attempt:  Outreach attempt #1 to patient for EMMI Red Alert follow up.  Daughter answered on patient's listed contact number and daughter's listed contact number.  Daughter states patient not at home currently she is at her sister's house.  HIPAA compliant message left with daughter whom states she will give message to patient to return call.  Plan:  RN Health Coach will make another outreach attempt to patient within one business day if no return call back from patient.  [place] [phone] [email or web address]

## 2017-03-14 ENCOUNTER — Other Ambulatory Visit: Payer: Self-pay | Admitting: *Deleted

## 2017-03-14 ENCOUNTER — Encounter: Payer: Self-pay | Admitting: *Deleted

## 2017-03-14 DIAGNOSIS — R42 Dizziness and giddiness: Secondary | ICD-10-CM | POA: Diagnosis not present

## 2017-03-14 DIAGNOSIS — E876 Hypokalemia: Secondary | ICD-10-CM | POA: Diagnosis not present

## 2017-03-14 NOTE — Patient Outreach (Signed)
[facility]) Care Management  03/14/2017  CEAZIA HARB [birth year] [id number]   EMMI Alert Follow Up  EMMI:  COPD Red on EMMI Alert EMMI Day:  #8 Date:  03/12/2017 Red Alert Reason:  Feeling worse overall? Yes; Harder to breathe? Yes;  More chest pain than yesterday? Yes; More mucus or thicker mucus? Yes; Mucus change color? Yes; # of times rescue inhaler used in past 24 hours? 5   Outreach Attempt:  Outreach attempt #2 to patient for EMMI Red Alert follow up. No answer. RN Health Coach left HIPAA compliant voicemail message along with contact information.  Plan:  RN Health Coach will send unsuccessful outreach letter to patient.  RN Health Coach will make final telephone outreach attempt to patient within the next 10 business days.  [place] [phone] [email or web address]

## 2017-03-20 ENCOUNTER — Ambulatory Visit: Payer: Self-pay | Admitting: *Deleted

## 2017-03-27 ENCOUNTER — Other Ambulatory Visit: Payer: Self-pay | Admitting: *Deleted

## 2017-03-27 NOTE — Patient Outreach (Signed)
[place] [facility]) Care Management  03/27/2017  Allison Vaughan 01-Jun-1945 [id number]   EMMI Alert Follow Up  EMMI:COPD [place] EMMI Alert EMMI Day:#8 Date:03/12/2017 RedAlert Reason: Feeling worse overall? Yes; Harder to breathe? Yes; More chest pain than yesterday? Yes; More mucus or thicker mucus? Yes; Mucus change color? Yes; # of times rescue inhaler used in past 24 hours? 5   Outreach Attempt:  Outreach attempt #3 to patient for EMMI Red Alert follow up. No answer. RN Health Coach left HIPAA compliant voicemail message along with contact information.  Primary Care Provider listed for patient not in network for [facility].  Case will be closed at this time.  Plan:  RN Health Coach will notify [facility] administrative assistant of case closure status due to inability to establish contact with patient.  [place] [phone] [email or web address]

## 2017-03-31 DIAGNOSIS — R0602 Shortness of breath: Secondary | ICD-10-CM | POA: Diagnosis not present

## 2017-03-31 DIAGNOSIS — J441 Chronic obstructive pulmonary disease with (acute) exacerbation: Secondary | ICD-10-CM | POA: Diagnosis not present

## 2017-04-19 ENCOUNTER — Other Ambulatory Visit: Payer: Self-pay

## 2017-04-19 ENCOUNTER — Encounter (HOSPITAL_COMMUNITY): Payer: Self-pay | Admitting: Emergency Medicine

## 2017-04-19 ENCOUNTER — Emergency Department (HOSPITAL_COMMUNITY)
Admission: EM | Admit: 2017-04-19 | Discharge: 2017-04-20 | Disposition: A | Discharge status: Home / Self Care | Payer: Medicare HMO | Attending: Emergency Medicine | Admitting: Emergency Medicine

## 2017-04-19 DIAGNOSIS — R1084 Generalized abdominal pain: Secondary | ICD-10-CM | POA: Diagnosis not present

## 2017-04-19 DIAGNOSIS — R150 Incomplete defecation: Secondary | ICD-10-CM

## 2017-04-19 DIAGNOSIS — J449 Chronic obstructive pulmonary disease, unspecified: Secondary | ICD-10-CM | POA: Insufficient documentation

## 2017-04-19 DIAGNOSIS — K5903 Drug induced constipation: Secondary | ICD-10-CM | POA: Diagnosis not present

## 2017-04-19 DIAGNOSIS — I10 Essential (primary) hypertension: Secondary | ICD-10-CM | POA: Diagnosis not present

## 2017-04-19 DIAGNOSIS — R197 Diarrhea, unspecified: Secondary | ICD-10-CM | POA: Diagnosis not present

## 2017-04-19 DIAGNOSIS — F17210 Nicotine dependence, cigarettes, uncomplicated: Secondary | ICD-10-CM | POA: Insufficient documentation

## 2017-04-19 DIAGNOSIS — K5900 Constipation, unspecified: Secondary | ICD-10-CM | POA: Diagnosis present

## 2017-04-19 DIAGNOSIS — Z79899 Other long term (current) drug therapy: Secondary | ICD-10-CM | POA: Diagnosis not present

## 2017-04-19 DIAGNOSIS — R109 Unspecified abdominal pain: Secondary | ICD-10-CM | POA: Diagnosis not present

## 2017-04-19 LAB — CBC WITH DIFFERENTIAL/PLATELET
Basophils Absolute: 0 10*3/uL (ref 0.0–0.1)
Basophils Relative: 0 %
Eosinophils Absolute: 0 10*3/uL (ref 0.0–0.7)
Eosinophils Relative: 0 %
HEMATOCRIT: 33.4 % — AB (ref 36.0–46.0)
HEMOGLOBIN: 11.3 g/dL — AB (ref 12.0–15.0)
LYMPHS ABS: 1 10*3/uL (ref 0.7–4.0)
Lymphocytes Relative: 13 %
MCH: 29 pg (ref 26.0–34.0)
MCHC: 33.8 g/dL (ref 30.0–36.0)
MCV: 85.9 fL (ref 78.0–100.0)
MONO ABS: 0.7 10*3/uL (ref 0.1–1.0)
MONOS PCT: 8 %
NEUTROS ABS: 6.2 10*3/uL (ref 1.7–7.7)
Neutrophils Relative %: 79 %
Platelets: 202 10*3/uL (ref 150–400)
RBC: 3.89 MIL/uL (ref 3.87–5.11)
RDW: 12.9 % (ref 11.5–15.5)
WBC: 7.9 10*3/uL (ref 4.0–10.5)

## 2017-04-19 LAB — COMPREHENSIVE METABOLIC PANEL
ALBUMIN: 3.4 g/dL — AB (ref 3.5–5.0)
ALK PHOS: 74 U/L (ref 38–126)
ALT: 10 U/L — ABNORMAL LOW (ref 14–54)
ANION GAP: 12 (ref 5–15)
AST: 19 U/L (ref 15–41)
BILIRUBIN TOTAL: 0.7 mg/dL (ref 0.3–1.2)
BUN: 23 mg/dL — ABNORMAL HIGH (ref 6–20)
CALCIUM: 9.1 mg/dL (ref 8.9–10.3)
CO2: 29 mmol/L (ref 22–32)
Chloride: 93 mmol/L — ABNORMAL LOW (ref 101–111)
Creatinine, Ser: 1.12 mg/dL — ABNORMAL HIGH (ref 0.44–1.00)
GFR, EST AFRICAN AMERICAN: 55 mL/min — AB (ref >60)
GFR, EST NON AFRICAN AMERICAN: 47 mL/min — AB (ref >60)
Glucose, Bld: 99 mg/dL (ref 65–99)
POTASSIUM: 2.9 mmol/L — AB (ref 3.5–5.1)
Sodium: 134 mmol/L — ABNORMAL LOW (ref 135–145)
TOTAL PROTEIN: 5.9 g/dL — AB (ref 6.5–8.1)

## 2017-04-19 LAB — LIPASE, BLOOD: LIPASE: 18 U/L (ref 11–51)

## 2017-04-19 MED ORDER — POTASSIUM CHLORIDE CRYS ER 20 MEQ PO TBCR
40.0000 meq | EXTENDED_RELEASE_TABLET | Freq: Once | ORAL | Status: AC
  Administered 2017-04-19: 40 meq via ORAL
  Filled 2017-04-19: qty 2

## 2017-04-19 MED ORDER — SODIUM CHLORIDE 0.9 % IV BOLUS
1000.0000 mL | Freq: Once | INTRAVENOUS | Status: AC
  Administered 2017-04-19: 1000 mL via INTRAVENOUS

## 2017-04-19 MED ORDER — MINERAL OIL RE ENEM
1.0000 | ENEMA | Freq: Once | RECTAL | Status: AC
  Administered 2017-04-19: 1 via RECTAL
  Filled 2017-04-19: qty 1

## 2017-04-19 MED ORDER — LIDOCAINE HCL 2 % EX GEL
1.0000 "application " | Freq: Once | CUTANEOUS | Status: AC
  Administered 2017-04-19: 1 via TOPICAL
  Filled 2017-04-19: qty 20

## 2017-04-19 NOTE — ED Notes (Signed)
ED Provider at bedside. 

## 2017-04-19 NOTE — ED Triage Notes (Signed)
Patient here with constipation for the last week.  Patient has not had a good BM since last week.  She has tried a suppository with minimal relief.  Patient is having some shortness of breath and abdominal pain due to this.  Patient is CAOx4, ambulatory on scene.  She has had some dizziness but has not been eating well.  She is on Methadone.  VSS 108/62, 80, 12 RR and CBG of 122.

## 2017-04-19 NOTE — ED Provider Notes (Signed)
[facility] EMERGENCY DEPARTMENT Provider Note  CSN: [id number] Arrival date & time: 04/19/17 2023  Chief Complaint(s) Constipation  HPI Allison Vaughan is a 72 y.o. female    Constipation   This is a recurrent problem. Episode onset: approx 1 week. The stool is described as firm. Associated symptoms include abdominal pain (discomfort) and flatus. She does not exercise regularly. There has been adequate water intake. She has tried stimulants for the symptoms. The treatment provided no relief. Risk factors: on methadone.    Past Medical History Past Medical History:  Diagnosis Date  . Anxiety   . CAP (community acquired pneumonia) 04/18/2014  . Chronic hypoxemic respiratory failure ([place]) 06/26/2012  . Chronic pain   . COPD (chronic obstructive pulmonary disease) ([place])   . DEPRESSIVE DISORDER, NOS 03/13/2006   Qualifier: Diagnosis of  By: Damita Dunnings MD, [place], NO ESOPHAGITIS 03/13/2006   Qualifier: Diagnosis of  By: Damita Dunnings MD, Burt Ek 03/13/2006   Qualifier: Diagnosis of  By: Damita Dunnings MD, Phillip Heal    . HYPERTENSION, BENIGN SYSTEMIC 03/13/2006   Qualifier: Diagnosis of  By: Damita Dunnings MD, Phillip Heal    . Irritable bowel syndrome 03/13/2006   Qualifier: Diagnosis of  By: Damita Dunnings MD, Phillip Heal    . Lung nodule 04/16/2014  . MENOPAUSAL SYNDROME 03/13/2006   Qualifier: Diagnosis of  By: Damita Dunnings MD, Deirdre Evener, [place] SITES 03/13/2006   Qualifier: Diagnosis of  By: Damita Dunnings MD, Isac Caddy DEPENDENCE 03/13/2006   Qualifier: Diagnosis of  By: Damita Dunnings MD, Phillip Heal     Patient Active Problem List   Diagnosis Date Noted  . 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 COPD with acute exacerbation ([place]) 03/01/2017  . Atrial fibrillation with RVR ([place]) 03/01/2017  . Acute on chronic respiratory failure with hypoxia ([place]) 03/01/2017  . Fall at home, initial encounter 03/01/2017  . Accidental drug overdose   . Respiratory failure  ([place]) 01/22/2017  . Acute encephalopathy   . AKI (acute kidney injury) ([place]) 04/30/2014  . Pulmonary nodule 04/30/2014  . Essential hypertension 04/30/2014  . Hypokalemia 04/30/2014  . Hyponatremia 04/30/2014  . Hypotension 04/30/2014  . Nausea 04/30/2014  . Lung nodule 04/16/2014  . Chronic hypoxemic respiratory failure ([place]) 06/26/2012  . COPD (chronic obstructive pulmonary disease) ([place]) 06/26/2012  . COPD exacerbation ([place]) 06/26/2012  . HYPERCHOLESTEROLEMIA 03/13/2006  . TOBACCO DEPENDENCE 03/13/2006  . Mood disorder ([place]) 03/13/2006  . HYPERTENSION, BENIGN SYSTEMIC 03/13/2006  . GASTROESOPHAGEAL REFLUX, NO ESOPHAGITIS 03/13/2006  . IRRITABLE BOWEL SYNDROME 03/13/2006  . MENOPAUSAL SYNDROME 03/13/2006  . OSTEOARTHRITIS, MULTI SITES 03/13/2006   Home Medication(s) Prior to Admission medications   Medication Sig Start Date End Date Taking? Authorizing Provider  albuterol (PROVENTIL HFA;VENTOLIN HFA) 108 (90 BASE) MCG/ACT inhaler Inhale 2 puffs into the lungs every 6 (six) hours as needed for wheezing. 06/09/12  Yes Oti, Brantley Stage, MD  albuterol (PROVENTIL) (2.5 MG/3ML) 0.083% nebulizer solution Take 3 mLs (2.5 mg total) by nebulization every 2 (two) hours as needed for wheezing. 04/20/14  Yes Rama, Venetia Maxon, MD  amLODipine (NORVASC) 10 MG tablet Take 1 tablet (10 mg total) by mouth daily. 05/02/14  Yes Kelvin Cellar, MD  bisacodyl (DULCOLAX) 10 MG suppository Place 10 mg rectally as needed for moderate constipation.   Yes [provider]  feeding supplement, ENSURE ENLIVE, (ENSURE ENLIVE) LIQD Take 237 mLs by mouth 2 (two) times daily between meals. 03/03/17  Yes Aline August, MD  FLUoxetine (PROZAC) 40 MG capsule Take 40 mg by mouth every morning.    Yes [provider]  guaiFENesin (MUCINEX) 600 MG 12 hr tablet Take 1 tablet (600 mg total) by mouth 2 (two) times daily. 03/03/17  Yes Aline August, MD  methadone (DOLOPHINE) 1 MG/1ML solution Take 60 mg by mouth  daily. [facility].    Yes [provider]  potassium chloride (K-DUR,KLOR-CON) 10 MEQ tablet Take 10 mEq by mouth daily. 02/11/17  Yes [provider]  simvastatin (ZOCOR) 20 MG tablet Take 20 mg by mouth at bedtime.  02/16/14  Yes [provider]  budesonide (PULMICORT) 0.25 MG/2ML nebulizer solution Take 2 mLs (0.25 mg total) by nebulization 2 (two) times daily. Patient not taking: Reported on 03/01/2017 04/20/14   Rama, Venetia Maxon, MD  ipratropium (ATROVENT) 0.02 % nebulizer solution Take 2.5 mLs (0.5 mg total) by nebulization 4 (four) times daily. Patient not taking: Reported on 03/01/2017 04/20/14   Rama, Venetia Maxon, MD                                                                                                                                    Past Surgical History Past Surgical History:  Procedure Laterality Date  . EYE SURGERY     Family History Family History  Problem Relation Age of Onset  . CAD Father   . Lung cancer Brother     Social History Social History   Tobacco Use  . Smoking status: Current Every Day Smoker    Packs/day: 1.00    Types: Cigarettes  . Smokeless tobacco: Never Used  Substance Use Topics  . Alcohol use: No    Frequency: Never    Comment: not in a long time  . Drug use: No   Allergies Patient has no known allergies.  Review of Systems Review of Systems  Gastrointestinal: Positive for abdominal pain (discomfort), constipation and flatus.   All other systems are reviewed and are negative for acute change except as noted in the HPI  Physical Exam Vital Signs  I have reviewed the triage vital signs BP 137/81   Pulse 71   Ht 5\' 2"  (1.575 m)   Wt 44.5 kg (98 lb)   SpO2 97%   BMI 17.92 kg/m   Physical Exam  Constitutional: She is oriented to person, place, and time. She appears well-developed and well-nourished. No distress.  HENT:  Head: Normocephalic and atraumatic.  Right Ear: External ear normal.   Left Ear: External ear normal.  Nose: Nose normal.  Eyes: Conjunctivae and EOM are normal. No scleral icterus.  Neck: Normal range of motion and phonation normal.  Cardiovascular: Normal rate and regular rhythm.  Pulmonary/Chest: Effort normal. No stridor. No respiratory distress.  Abdominal: She exhibits no distension.  Genitourinary: Rectal exam shows external hemorrhoid.  Genitourinary Comments: Rectal impaction  Musculoskeletal: Normal range of motion. She  exhibits no edema.  Neurological: She is alert and oriented to person, place, and time.  Skin: She is not diaphoretic.  Psychiatric: She has a normal mood and affect. Her behavior is normal.  Vitals reviewed.   ED Results and Treatments Labs (all labs ordered are listed, but only abnormal results are displayed) Labs Reviewed  CBC WITH DIFFERENTIAL/PLATELET - Abnormal; Notable for the following components:      Result Value   Hemoglobin 11.3 (*)    HCT 33.4 (*)    All other components within normal limits  COMPREHENSIVE METABOLIC PANEL - Abnormal; Notable for the following components:   Sodium 134 (*)    Potassium 2.9 (*)    Chloride 93 (*)    BUN 23 (*)    Creatinine, Ser 1.12 (*)    Total Protein 5.9 (*)    Albumin 3.4 (*)    ALT 10 (*)    GFR calc non Af Amer 47 (*)    GFR calc Af Amer 55 (*)    All other components within normal limits  LIPASE, BLOOD                                                                                                                         EKG  EKG Interpretation  Date/Time:    Ventricular Rate:    PR Interval:    QRS Duration:   QT Interval:    QTC Calculation:   R Axis:     Text Interpretation:        Radiology No results found. Pertinent labs & imaging results that were available during my care of the patient were reviewed by me and considered in my medical decision making (see chart for details).  Medications Ordered in ED Medications  potassium chloride SA  (K-DUR,KLOR-CON) CR tablet 40 mEq (has no administration in time range)  lidocaine (XYLOCAINE) 2 % jelly 1 application (1 application Topical Given 04/19/17 2241)  mineral oil enema 1 enema (1 enema Rectal Given 04/19/17 2355)  sodium chloride 0.9 % bolus 1,000 mL (1,000 mLs Intravenous New Bag/Given 04/19/17 2348)                                                                                                                                    Procedures Fecal disimpaction Date/Time: 04/19/2017 11:16 PM Performed by: Fatima Blank, MD Authorized by: Fatima Blank, MD  Consent: Verbal consent obtained. Consent given by: patient Patient understanding: patient states understanding of the procedure being performed Patient identity confirmed: verbally with patient Time out: Immediately prior to procedure a "time out" was called to verify the correct patient, procedure, equipment, support staff and site/side marked as required. Local anesthesia used: no  Anesthesia: Local anesthesia used: no  Sedation: Patient sedated: no  Patient tolerance: Patient tolerated the procedure well with no immediate complications     (including critical care time)  Medical Decision Making / ED Course I have reviewed the nursing notes for this encounter and the patient's prior records (if available in EHR or on provided paperwork).    Fecal impaction. Digital disimpaction at bedside performed. Provided with enema. Screening labs reassuring.   Able to have a BM.  The patient appears reasonably screened and/or stabilized for discharge and I doubt any other medical condition or other [facility] requiring further screening, evaluation, or treatment in the ED at this time prior to discharge.  The patient is safe for discharge with strict return precautions.   Final Clinical Impression(s) / ED Diagnoses Final diagnoses:  Incomplete defecation  Drug-induced constipation    Disposition:  Discharge  Condition: Good  I have discussed the results, Dx and Tx plan with the patient who expressed understanding and agree(s) with the plan. Discharge instructions discussed at great length. The patient was given strict return precautions who verbalized understanding of the instructions. No further questions at time of discharge.    ED Discharge Orders    None       Follow Up: Antonietta Jewel, MD [address] [place] [address] [phone]        This chart was dictated using voice recognition software.  Despite best efforts to proofread,  errors can occur which can change the documentation meaning.   Fatima Blank, MD 04/20/17 [phone]

## 2017-04-20 NOTE — ED Notes (Signed)
ED Provider at bedside. 

## 2017-04-23 DIAGNOSIS — J449 Chronic obstructive pulmonary disease, unspecified: Secondary | ICD-10-CM | POA: Diagnosis not present

## 2017-04-23 DIAGNOSIS — J9621 Acute and chronic respiratory failure with hypoxia: Secondary | ICD-10-CM | POA: Diagnosis not present

## 2017-04-23 DIAGNOSIS — I4891 Unspecified atrial fibrillation: Secondary | ICD-10-CM | POA: Diagnosis not present

## 2017-04-23 DIAGNOSIS — I10 Essential (primary) hypertension: Secondary | ICD-10-CM | POA: Diagnosis not present

## 2017-04-23 DIAGNOSIS — Z7689 Persons encountering health services in other specified circumstances: Secondary | ICD-10-CM | POA: Diagnosis not present

## 2017-04-23 DIAGNOSIS — F39 Unspecified mood [affective] disorder: Secondary | ICD-10-CM | POA: Diagnosis not present

## 2017-04-23 DIAGNOSIS — W19XXXD Unspecified fall, subsequent encounter: Secondary | ICD-10-CM | POA: Diagnosis not present

## 2017-04-23 DIAGNOSIS — I471 Supraventricular tachycardia: Secondary | ICD-10-CM | POA: Diagnosis not present

## 2017-04-23 DIAGNOSIS — E7800 Pure hypercholesterolemia, unspecified: Secondary | ICD-10-CM | POA: Diagnosis not present

## 2017-05-01 DIAGNOSIS — J441 Chronic obstructive pulmonary disease with (acute) exacerbation: Secondary | ICD-10-CM | POA: Diagnosis not present

## 2017-05-01 DIAGNOSIS — R0602 Shortness of breath: Secondary | ICD-10-CM | POA: Diagnosis not present

## 2017-05-20 DIAGNOSIS — F1120 Opioid dependence, uncomplicated: Secondary | ICD-10-CM | POA: Diagnosis not present

## 2017-05-27 DIAGNOSIS — F1120 Opioid dependence, uncomplicated: Secondary | ICD-10-CM | POA: Diagnosis not present

## 2017-05-31 DIAGNOSIS — R0602 Shortness of breath: Secondary | ICD-10-CM | POA: Diagnosis not present

## 2017-05-31 DIAGNOSIS — J441 Chronic obstructive pulmonary disease with (acute) exacerbation: Secondary | ICD-10-CM | POA: Diagnosis not present

## 2017-06-03 DIAGNOSIS — F1120 Opioid dependence, uncomplicated: Secondary | ICD-10-CM | POA: Diagnosis not present

## 2017-06-05 DIAGNOSIS — H8110 Benign paroxysmal vertigo, unspecified ear: Secondary | ICD-10-CM | POA: Diagnosis not present

## 2017-06-05 DIAGNOSIS — I4891 Unspecified atrial fibrillation: Secondary | ICD-10-CM | POA: Diagnosis not present

## 2017-06-05 DIAGNOSIS — J449 Chronic obstructive pulmonary disease, unspecified: Secondary | ICD-10-CM | POA: Diagnosis not present

## 2017-06-05 DIAGNOSIS — I10 Essential (primary) hypertension: Secondary | ICD-10-CM | POA: Diagnosis not present

## 2017-06-05 DIAGNOSIS — Z780 Asymptomatic menopausal state: Secondary | ICD-10-CM | POA: Diagnosis not present

## 2017-06-05 DIAGNOSIS — M545 Low back pain: Secondary | ICD-10-CM | POA: Diagnosis not present

## 2017-06-05 DIAGNOSIS — M5136 Other intervertebral disc degeneration, lumbar region: Secondary | ICD-10-CM | POA: Diagnosis not present

## 2017-06-05 DIAGNOSIS — Z5181 Encounter for therapeutic drug level monitoring: Secondary | ICD-10-CM | POA: Diagnosis not present

## 2017-06-05 DIAGNOSIS — F39 Unspecified mood [affective] disorder: Secondary | ICD-10-CM | POA: Diagnosis not present

## 2017-06-10 DIAGNOSIS — M545 Low back pain: Secondary | ICD-10-CM | POA: Diagnosis not present

## 2017-06-10 DIAGNOSIS — F419 Anxiety disorder, unspecified: Secondary | ICD-10-CM | POA: Diagnosis not present

## 2017-06-10 DIAGNOSIS — Z780 Asymptomatic menopausal state: Secondary | ICD-10-CM | POA: Diagnosis not present

## 2017-06-10 DIAGNOSIS — H8110 Benign paroxysmal vertigo, unspecified ear: Secondary | ICD-10-CM | POA: Diagnosis not present

## 2017-06-10 DIAGNOSIS — K629 Disease of anus and rectum, unspecified: Secondary | ICD-10-CM | POA: Diagnosis not present

## 2017-06-10 DIAGNOSIS — M8580 Other specified disorders of bone density and structure, unspecified site: Secondary | ICD-10-CM | POA: Diagnosis not present

## 2017-06-11 DIAGNOSIS — E785 Hyperlipidemia, unspecified: Secondary | ICD-10-CM | POA: Diagnosis not present

## 2017-06-11 DIAGNOSIS — I10 Essential (primary) hypertension: Secondary | ICD-10-CM | POA: Diagnosis not present

## 2017-06-11 DIAGNOSIS — K219 Gastro-esophageal reflux disease without esophagitis: Secondary | ICD-10-CM | POA: Diagnosis not present

## 2017-06-11 DIAGNOSIS — I2510 Atherosclerotic heart disease of native coronary artery without angina pectoris: Secondary | ICD-10-CM | POA: Diagnosis not present

## 2017-06-12 ENCOUNTER — Other Ambulatory Visit: Payer: Self-pay | Admitting: Internal Medicine

## 2017-06-12 DIAGNOSIS — R42 Dizziness and giddiness: Secondary | ICD-10-CM

## 2017-06-12 DIAGNOSIS — M545 Low back pain: Secondary | ICD-10-CM

## 2017-06-13 ENCOUNTER — Ambulatory Visit: Payer: Medicare HMO | Admitting: Gastroenterology

## 2017-06-16 ENCOUNTER — Institutional Professional Consult (permissible substitution): Payer: Medicare HMO | Admitting: Internal Medicine

## 2017-06-16 ENCOUNTER — Encounter: Payer: Self-pay | Admitting: *Deleted

## 2017-06-16 ENCOUNTER — Encounter: Payer: Self-pay | Admitting: Internal Medicine

## 2017-06-23 DIAGNOSIS — H8110 Benign paroxysmal vertigo, unspecified ear: Secondary | ICD-10-CM | POA: Diagnosis not present

## 2017-06-23 DIAGNOSIS — Z5181 Encounter for therapeutic drug level monitoring: Secondary | ICD-10-CM | POA: Diagnosis not present

## 2017-06-23 DIAGNOSIS — I10 Essential (primary) hypertension: Secondary | ICD-10-CM | POA: Diagnosis not present

## 2017-06-23 DIAGNOSIS — M545 Low back pain: Secondary | ICD-10-CM | POA: Diagnosis not present

## 2017-06-23 DIAGNOSIS — F39 Unspecified mood [affective] disorder: Secondary | ICD-10-CM | POA: Diagnosis not present

## 2017-06-24 DIAGNOSIS — J449 Chronic obstructive pulmonary disease, unspecified: Secondary | ICD-10-CM | POA: Diagnosis not present

## 2017-06-24 DIAGNOSIS — K219 Gastro-esophageal reflux disease without esophagitis: Secondary | ICD-10-CM | POA: Diagnosis not present

## 2017-06-24 DIAGNOSIS — E785 Hyperlipidemia, unspecified: Secondary | ICD-10-CM | POA: Diagnosis not present

## 2017-06-24 DIAGNOSIS — I10 Essential (primary) hypertension: Secondary | ICD-10-CM | POA: Diagnosis not present

## 2017-06-25 ENCOUNTER — Ambulatory Visit
Admission: RE | Admit: 2017-06-25 | Discharge: 2017-06-25 | Disposition: A | Discharge status: Home / Self Care | Payer: Medicare HMO | Source: Ambulatory Visit | Attending: Internal Medicine | Admitting: Internal Medicine

## 2017-06-25 DIAGNOSIS — R42 Dizziness and giddiness: Secondary | ICD-10-CM

## 2017-06-25 DIAGNOSIS — M545 Low back pain: Secondary | ICD-10-CM

## 2017-06-25 DIAGNOSIS — M48061 Spinal stenosis, lumbar region without neurogenic claudication: Secondary | ICD-10-CM | POA: Diagnosis not present

## 2017-06-27 DIAGNOSIS — M549 Dorsalgia, unspecified: Secondary | ICD-10-CM | POA: Diagnosis not present

## 2017-06-27 DIAGNOSIS — M545 Low back pain: Secondary | ICD-10-CM | POA: Diagnosis not present

## 2017-06-27 DIAGNOSIS — N390 Urinary tract infection, site not specified: Secondary | ICD-10-CM | POA: Diagnosis not present

## 2017-06-27 DIAGNOSIS — R300 Dysuria: Secondary | ICD-10-CM | POA: Diagnosis not present

## 2017-06-27 DIAGNOSIS — R0981 Nasal congestion: Secondary | ICD-10-CM | POA: Diagnosis not present

## 2017-07-01 DIAGNOSIS — I4891 Unspecified atrial fibrillation: Secondary | ICD-10-CM | POA: Diagnosis not present

## 2017-07-01 DIAGNOSIS — M545 Low back pain: Secondary | ICD-10-CM | POA: Diagnosis not present

## 2017-07-01 DIAGNOSIS — H8110 Benign paroxysmal vertigo, unspecified ear: Secondary | ICD-10-CM | POA: Diagnosis not present

## 2017-07-01 DIAGNOSIS — I2510 Atherosclerotic heart disease of native coronary artery without angina pectoris: Secondary | ICD-10-CM | POA: Diagnosis not present

## 2017-07-01 DIAGNOSIS — Z87891 Personal history of nicotine dependence: Secondary | ICD-10-CM | POA: Diagnosis not present

## 2017-07-01 DIAGNOSIS — J441 Chronic obstructive pulmonary disease with (acute) exacerbation: Secondary | ICD-10-CM | POA: Diagnosis not present

## 2017-07-01 DIAGNOSIS — I10 Essential (primary) hypertension: Secondary | ICD-10-CM | POA: Diagnosis not present

## 2017-07-01 DIAGNOSIS — J449 Chronic obstructive pulmonary disease, unspecified: Secondary | ICD-10-CM | POA: Diagnosis not present

## 2017-07-01 DIAGNOSIS — R0602 Shortness of breath: Secondary | ICD-10-CM | POA: Diagnosis not present

## 2017-07-08 DIAGNOSIS — Z87891 Personal history of nicotine dependence: Secondary | ICD-10-CM | POA: Diagnosis not present

## 2017-07-08 DIAGNOSIS — H8110 Benign paroxysmal vertigo, unspecified ear: Secondary | ICD-10-CM | POA: Diagnosis not present

## 2017-07-08 DIAGNOSIS — I4891 Unspecified atrial fibrillation: Secondary | ICD-10-CM | POA: Diagnosis not present

## 2017-07-08 DIAGNOSIS — J449 Chronic obstructive pulmonary disease, unspecified: Secondary | ICD-10-CM | POA: Diagnosis not present

## 2017-07-08 DIAGNOSIS — M545 Low back pain: Secondary | ICD-10-CM | POA: Diagnosis not present

## 2017-07-08 DIAGNOSIS — I2510 Atherosclerotic heart disease of native coronary artery without angina pectoris: Secondary | ICD-10-CM | POA: Diagnosis not present

## 2017-07-08 DIAGNOSIS — I10 Essential (primary) hypertension: Secondary | ICD-10-CM | POA: Diagnosis not present

## 2017-07-10 DIAGNOSIS — I2510 Atherosclerotic heart disease of native coronary artery without angina pectoris: Secondary | ICD-10-CM | POA: Diagnosis not present

## 2017-07-10 DIAGNOSIS — Z87891 Personal history of nicotine dependence: Secondary | ICD-10-CM | POA: Diagnosis not present

## 2017-07-10 DIAGNOSIS — I10 Essential (primary) hypertension: Secondary | ICD-10-CM | POA: Diagnosis not present

## 2017-07-10 DIAGNOSIS — M549 Dorsalgia, unspecified: Secondary | ICD-10-CM | POA: Diagnosis not present

## 2017-07-10 DIAGNOSIS — R609 Edema, unspecified: Secondary | ICD-10-CM | POA: Diagnosis not present

## 2017-07-10 DIAGNOSIS — H8110 Benign paroxysmal vertigo, unspecified ear: Secondary | ICD-10-CM | POA: Diagnosis not present

## 2017-07-10 DIAGNOSIS — J449 Chronic obstructive pulmonary disease, unspecified: Secondary | ICD-10-CM | POA: Diagnosis not present

## 2017-07-10 DIAGNOSIS — I4891 Unspecified atrial fibrillation: Secondary | ICD-10-CM | POA: Diagnosis not present

## 2017-07-10 DIAGNOSIS — M545 Low back pain: Secondary | ICD-10-CM | POA: Diagnosis not present

## 2017-07-12 ENCOUNTER — Emergency Department (HOSPITAL_COMMUNITY): Payer: Medicare HMO

## 2017-07-12 ENCOUNTER — Emergency Department (HOSPITAL_COMMUNITY)
Admission: EM | Admit: 2017-07-12 | Discharge: 2017-07-12 | Disposition: A | Discharge status: Home / Self Care | Payer: Medicare HMO | Attending: Emergency Medicine | Admitting: Emergency Medicine

## 2017-07-12 ENCOUNTER — Other Ambulatory Visit: Payer: Self-pay

## 2017-07-12 DIAGNOSIS — Z87891 Personal history of nicotine dependence: Secondary | ICD-10-CM | POA: Insufficient documentation

## 2017-07-12 DIAGNOSIS — R609 Edema, unspecified: Secondary | ICD-10-CM

## 2017-07-12 DIAGNOSIS — Z79899 Other long term (current) drug therapy: Secondary | ICD-10-CM | POA: Diagnosis not present

## 2017-07-12 DIAGNOSIS — I10 Essential (primary) hypertension: Secondary | ICD-10-CM | POA: Diagnosis not present

## 2017-07-12 DIAGNOSIS — R600 Localized edema: Secondary | ICD-10-CM | POA: Diagnosis not present

## 2017-07-12 DIAGNOSIS — R079 Chest pain, unspecified: Secondary | ICD-10-CM | POA: Diagnosis not present

## 2017-07-12 DIAGNOSIS — J449 Chronic obstructive pulmonary disease, unspecified: Secondary | ICD-10-CM | POA: Insufficient documentation

## 2017-07-12 DIAGNOSIS — R9431 Abnormal electrocardiogram [ECG] [EKG]: Secondary | ICD-10-CM | POA: Diagnosis not present

## 2017-07-12 LAB — CBC
HCT: 29.8 % — ABNORMAL LOW (ref 36.0–46.0)
Hemoglobin: 9.8 g/dL — ABNORMAL LOW (ref 12.0–15.0)
MCH: 29.7 pg (ref 26.0–34.0)
MCHC: 32.9 g/dL (ref 30.0–36.0)
MCV: 90.3 fL (ref 78.0–100.0)
Platelets: 218 10*3/uL (ref 150–400)
RBC: 3.3 MIL/uL — ABNORMAL LOW (ref 3.87–5.11)
RDW: 12.4 % (ref 11.5–15.5)
WBC: 5.2 10*3/uL (ref 4.0–10.5)

## 2017-07-12 LAB — BASIC METABOLIC PANEL
Anion gap: 9 (ref 5–15)
BUN: 20 mg/dL (ref 8–23)
CO2: 29 mmol/L (ref 22–32)
Calcium: 9 mg/dL (ref 8.9–10.3)
Chloride: 101 mmol/L (ref 98–111)
Creatinine, Ser: 1.53 mg/dL — ABNORMAL HIGH (ref 0.44–1.00)
GFR calc Af Amer: 38 mL/min — ABNORMAL LOW (ref >60)
GFR calc non Af Amer: 32 mL/min — ABNORMAL LOW (ref >60)
Glucose, Bld: 118 mg/dL — ABNORMAL HIGH (ref 70–99)
Potassium: 3.8 mmol/L (ref 3.5–5.1)
Sodium: 139 mmol/L (ref 135–145)

## 2017-07-12 LAB — I-STAT TROPONIN, ED: Troponin i, poc: 0 ng/mL (ref 0.00–0.08)

## 2017-07-12 NOTE — ED Triage Notes (Signed)
Patient c/o swelling to bilateral lower extremities with CP as a secondary complaint. Went to primary care phys on Friday and was given a "fluid" pill but states that it is not working. Asked that we check out her chest because of family hx of heart attacks.

## 2017-07-12 NOTE — Discharge Instructions (Addendum)
Keep taking medications as prescribed. Keep legs elevated when off of them. Wear compression hoes.

## 2017-07-14 DIAGNOSIS — Z0000 Encounter for general adult medical examination without abnormal findings: Secondary | ICD-10-CM | POA: Diagnosis not present

## 2017-07-14 DIAGNOSIS — Z79899 Other long term (current) drug therapy: Secondary | ICD-10-CM | POA: Diagnosis not present

## 2017-07-14 DIAGNOSIS — Z79891 Long term (current) use of opiate analgesic: Secondary | ICD-10-CM | POA: Diagnosis not present

## 2017-07-14 NOTE — ED Provider Notes (Signed)
[facility]PART[facility] Provider Note   CSN: [id number] Arrival date & time: 07/12/17  1907     History   Chief Complaint Chief Complaint  Patient presents with  . Chest Pain    HPI Allison Vaughan is a 72 y.o. female.  HPI   72 year old female with lower extremity swelling.  Worsening over the past week or so.  Went to see her PCP yesterday and was started on a diuretic.  Coming in for evaluation currently but she feels like her symptoms not approved.  She has no respiratory complaints.  No orthopnea.  No cough.  Denies any chest pain.  Past Medical History:  Diagnosis Date  . Anxiety   . Atrial fibrillation ([place])   . CAP (community acquired pneumonia) 04/18/2014  . Chronic hypoxemic respiratory failure ([place]) 06/26/2012  . Chronic pain   . COPD (chronic obstructive pulmonary disease) ([place])   . DEPRESSIVE DISORDER, NOS 03/13/2006   Qualifier: Diagnosis of  By: Damita Dunnings MD, [place], NO ESOPHAGITIS 03/13/2006   Qualifier: Diagnosis of  By: Damita Dunnings MD, Burt Ek 03/13/2006   Qualifier: Diagnosis of  By: Damita Dunnings MD, Phillip Heal    . HYPERTENSION, BENIGN SYSTEMIC 03/13/2006   Qualifier: Diagnosis of  By: Damita Dunnings MD, Phillip Heal    . Irritable bowel syndrome 03/13/2006   Qualifier: Diagnosis of  By: Damita Dunnings MD, Phillip Heal    . Lung nodule 04/16/2014  . MENOPAUSAL SYNDROME 03/13/2006   Qualifier: Diagnosis of  By: Damita Dunnings MD, Deirdre Evener, [place] SITES 03/13/2006   Qualifier: Diagnosis of  By: Damita Dunnings MD, Isac Caddy DEPENDENCE 03/13/2006   Qualifier: Diagnosis of  By: Damita Dunnings MD, Phillip Heal      Patient Active Problem List   Diagnosis Date Noted  . 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 COPD with acute exacerbation ([place]) 03/01/2017  . Atrial fibrillation with RVR ([place]) 03/01/2017  . Acute on chronic respiratory failure with hypoxia ([place]) 03/01/2017  . Fall at home, initial encounter 03/01/2017  .  Accidental drug overdose   . Respiratory failure ([place]) 01/22/2017  . Acute encephalopathy   . AKI (acute kidney injury) ([place]) 04/30/2014  . Pulmonary nodule 04/30/2014  . Essential hypertension 04/30/2014  . Hypokalemia 04/30/2014  . Hyponatremia 04/30/2014  . Hypotension 04/30/2014  . Nausea 04/30/2014  . Lung nodule 04/16/2014  . Chronic hypoxemic respiratory failure ([place]) 06/26/2012  . COPD (chronic obstructive pulmonary disease) ([place]) 06/26/2012  . COPD exacerbation ([place]) 06/26/2012  . HYPERCHOLESTEROLEMIA 03/13/2006  . TOBACCO DEPENDENCE 03/13/2006  . Mood disorder ([place]) 03/13/2006  . HYPERTENSION, BENIGN SYSTEMIC 03/13/2006  . GASTROESOPHAGEAL REFLUX, NO ESOPHAGITIS 03/13/2006  . IRRITABLE BOWEL SYNDROME 03/13/2006  . MENOPAUSAL SYNDROME 03/13/2006  . OSTEOARTHRITIS, MULTI SITES 03/13/2006    Past Surgical History:  Procedure Laterality Date  . EYE SURGERY    . ROTATOR CUFF REPAIR Right 2012     OB History   None      Home Medications    Prior to Admission medications   Medication Sig Start Date End Date Taking? Authorizing Provider  albuterol (PROVENTIL HFA;VENTOLIN HFA) 108 (90 BASE) MCG/ACT inhaler Inhale 2 puffs into the lungs every 6 (six) hours as needed for wheezing. 06/09/12   Monika Salk, MD  albuterol (PROVENTIL) (2.5 MG/3ML) 0.083% nebulizer solution Take 3 mLs (2.5 mg total) by nebulization every 2 (two) hours as needed for wheezing. 04/20/14   Rama, Margreta Journey  P, MD  amLODipine (NORVASC) 10 MG tablet Take 1 tablet (10 mg total) by mouth daily. 05/02/14   Kelvin Cellar, MD  bisacodyl (DULCOLAX) 10 MG suppository Place 10 mg rectally as needed for moderate constipation.    [provider]  budesonide (PULMICORT) 0.25 MG/2ML nebulizer solution Take 2 mLs (0.25 mg total) by nebulization 2 (two) times daily. Patient not taking: Reported on 03/01/2017 04/20/14   Rama, Venetia Maxon, MD  feeding supplement, ENSURE ENLIVE, (ENSURE ENLIVE) LIQD Take 237 mLs  by mouth 2 (two) times daily between meals. 03/03/17   Aline August, MD  FLUoxetine (PROZAC) 40 MG capsule Take 40 mg by mouth every morning.     [provider]  guaiFENesin (MUCINEX) 600 MG 12 hr tablet Take 1 tablet (600 mg total) by mouth 2 (two) times daily. 03/03/17   Aline August, MD  ipratropium (ATROVENT) 0.02 % nebulizer solution Take 2.5 mLs (0.5 mg total) by nebulization 4 (four) times daily. Patient not taking: Reported on 03/01/2017 04/20/14   Rama, Venetia Maxon, MD  methadone (DOLOPHINE) 1 MG/1ML solution Take 60 mg by mouth daily. [facility].     [provider]  potassium chloride (K-DUR,KLOR-CON) 10 MEQ tablet Take 10 mEq by mouth daily. 02/11/17   [provider]  simvastatin (ZOCOR) 20 MG tablet Take 20 mg by mouth at bedtime.  02/16/14   [provider]    Family History Family History  Problem Relation Age of Onset  . CAD Father   . Lung cancer Brother   . Healthy Son   . Healthy Daughter   . Healthy Daughter     Social History Social History   Tobacco Use  . Smoking status: Former Smoker    Packs/day: 1.00    Types: Cigarettes    Last attempt to quit: 03/16/2017    Years since quitting: 0.3  . Smokeless tobacco: Never Used  Substance Use Topics  . Alcohol use: No    Frequency: Never    Comment: not in a long time  . Drug use: No     Allergies   Patient has no known allergies.   Review of Systems Review of Systems  All systems reviewed and negative, other than as noted in HPI.  Physical Exam Updated Vital Signs BP (!) 152/83   Pulse 73   Temp 98.2 F (36.8 C) (Oral)   Resp 15   Ht 5' (1.524 m)   Wt 45.4 kg (100 lb)   SpO2 97%   BMI 19.53 kg/m   Physical Exam  Constitutional: She appears well-developed and well-nourished. No distress.  HENT:  Head: Normocephalic and atraumatic.  Eyes: Conjunctivae are normal. Right eye exhibits no discharge. Left eye exhibits no discharge.  Neck: Neck  supple.  Cardiovascular: Normal rate, regular rhythm and normal heart sounds. Exam reveals no gallop and no friction rub.  No murmur heard. Pulmonary/Chest: Effort normal and breath sounds normal. No respiratory distress.  Abdominal: Soft. She exhibits no distension. There is no tenderness.  Musculoskeletal: She exhibits no tenderness.       Left lower leg: She exhibits edema.  Symmetric pitting bilateral lower extremity edema.  Neurological: She is alert.  Skin: Skin is warm and dry.  Psychiatric: She has a normal mood and affect. Her behavior is normal. Thought content normal.  Nursing note and vitals reviewed.    ED Treatments / Results  Labs (all labs ordered are listed, but only abnormal results are displayed) Labs Reviewed  BASIC METABOLIC PANEL - Abnormal; Notable for the following components:      Result Value   Glucose, Bld 118 (*)    Creatinine, Ser 1.53 (*)    GFR calc non Af Amer 32 (*)    GFR calc Af Amer 38 (*)    All other components within normal limits  CBC - Abnormal; Notable for the following components:   RBC 3.30 (*)    Hemoglobin 9.8 (*)    HCT 29.8 (*)    All other components within normal limits  I-STAT TROPONIN, ED    EKG EKG Interpretation  Date/Time:  Saturday July 12 2017 19:10:21 EDT Ventricular Rate:  83 PR Interval:  140 QRS Duration: 60 QT Interval:  354 QTC Calculation: 415 R Axis:   89 Text Interpretation:  Sinus rhythm with Premature atrial complexes Low voltage QRS Septal infarct , age undetermined Abnormal ECG Confirmed by Virgel Manifold [phone]) on 07/12/2017 8:16:24 PM Also confirmed by Virgel Manifold [phone]), editor Lynder Parents [phone])  on 07/13/2017 12:48:23 PM   Radiology No results found.  Procedures Procedures (including critical care time)  Medications Ordered in ED Medications - No data to display   Initial Impression / Assessment and Plan / ED Course  I have reviewed the triage vital signs and the nursing  notes.  Pertinent labs & imaging results that were available during my care of the patient were reviewed by me and considered in my medical decision making (see chart for details).     72 year old female with lower extremity edema.  She has no respiratory complaints.  She is to start on diuretic yesterday.  Explained that I am not overly concerned with no significant improvement at this point.  Advised to continue medication as prescribed.  Return precautions were discussed.  Final Clinical Impressions(s) / ED Diagnoses   Final diagnoses:  Peripheral edema    ED Discharge Orders    None       Virgel Manifold, MD 07/14/17 2149

## 2017-07-17 DIAGNOSIS — H8110 Benign paroxysmal vertigo, unspecified ear: Secondary | ICD-10-CM | POA: Diagnosis not present

## 2017-07-17 DIAGNOSIS — I10 Essential (primary) hypertension: Secondary | ICD-10-CM | POA: Diagnosis not present

## 2017-07-17 DIAGNOSIS — M545 Low back pain: Secondary | ICD-10-CM | POA: Diagnosis not present

## 2017-07-17 DIAGNOSIS — I4891 Unspecified atrial fibrillation: Secondary | ICD-10-CM | POA: Diagnosis not present

## 2017-07-17 DIAGNOSIS — Z87891 Personal history of nicotine dependence: Secondary | ICD-10-CM | POA: Diagnosis not present

## 2017-07-17 DIAGNOSIS — I2510 Atherosclerotic heart disease of native coronary artery without angina pectoris: Secondary | ICD-10-CM | POA: Diagnosis not present

## 2017-07-17 DIAGNOSIS — J449 Chronic obstructive pulmonary disease, unspecified: Secondary | ICD-10-CM | POA: Diagnosis not present

## 2017-07-24 DIAGNOSIS — I10 Essential (primary) hypertension: Secondary | ICD-10-CM | POA: Diagnosis not present

## 2017-07-24 DIAGNOSIS — J449 Chronic obstructive pulmonary disease, unspecified: Secondary | ICD-10-CM | POA: Diagnosis not present

## 2017-07-24 DIAGNOSIS — I4891 Unspecified atrial fibrillation: Secondary | ICD-10-CM | POA: Diagnosis not present

## 2017-07-24 DIAGNOSIS — H8110 Benign paroxysmal vertigo, unspecified ear: Secondary | ICD-10-CM | POA: Diagnosis not present

## 2017-07-24 DIAGNOSIS — M545 Low back pain: Secondary | ICD-10-CM | POA: Diagnosis not present

## 2017-07-24 DIAGNOSIS — I2510 Atherosclerotic heart disease of native coronary artery without angina pectoris: Secondary | ICD-10-CM | POA: Diagnosis not present

## 2017-07-24 DIAGNOSIS — Z87891 Personal history of nicotine dependence: Secondary | ICD-10-CM | POA: Diagnosis not present

## 2017-07-29 DIAGNOSIS — Z87891 Personal history of nicotine dependence: Secondary | ICD-10-CM | POA: Diagnosis not present

## 2017-07-29 DIAGNOSIS — I10 Essential (primary) hypertension: Secondary | ICD-10-CM | POA: Diagnosis not present

## 2017-07-29 DIAGNOSIS — M545 Low back pain: Secondary | ICD-10-CM | POA: Diagnosis not present

## 2017-07-29 DIAGNOSIS — J449 Chronic obstructive pulmonary disease, unspecified: Secondary | ICD-10-CM | POA: Diagnosis not present

## 2017-07-29 DIAGNOSIS — I2510 Atherosclerotic heart disease of native coronary artery without angina pectoris: Secondary | ICD-10-CM | POA: Diagnosis not present

## 2017-07-29 DIAGNOSIS — H8110 Benign paroxysmal vertigo, unspecified ear: Secondary | ICD-10-CM | POA: Diagnosis not present

## 2017-07-29 DIAGNOSIS — I4891 Unspecified atrial fibrillation: Secondary | ICD-10-CM | POA: Diagnosis not present

## 2017-07-31 DIAGNOSIS — J449 Chronic obstructive pulmonary disease, unspecified: Secondary | ICD-10-CM | POA: Diagnosis not present

## 2017-07-31 DIAGNOSIS — M549 Dorsalgia, unspecified: Secondary | ICD-10-CM | POA: Diagnosis not present

## 2017-07-31 DIAGNOSIS — R0602 Shortness of breath: Secondary | ICD-10-CM | POA: Diagnosis not present

## 2017-07-31 DIAGNOSIS — F419 Anxiety disorder, unspecified: Secondary | ICD-10-CM | POA: Diagnosis not present

## 2017-07-31 DIAGNOSIS — J441 Chronic obstructive pulmonary disease with (acute) exacerbation: Secondary | ICD-10-CM | POA: Diagnosis not present

## 2017-08-14 DIAGNOSIS — M545 Low back pain: Secondary | ICD-10-CM | POA: Diagnosis not present

## 2017-08-14 DIAGNOSIS — I2510 Atherosclerotic heart disease of native coronary artery without angina pectoris: Secondary | ICD-10-CM | POA: Diagnosis not present

## 2017-08-14 DIAGNOSIS — I10 Essential (primary) hypertension: Secondary | ICD-10-CM | POA: Diagnosis not present

## 2017-08-14 DIAGNOSIS — I4891 Unspecified atrial fibrillation: Secondary | ICD-10-CM | POA: Diagnosis not present

## 2017-08-14 DIAGNOSIS — H8110 Benign paroxysmal vertigo, unspecified ear: Secondary | ICD-10-CM | POA: Diagnosis not present

## 2017-08-14 DIAGNOSIS — Z87891 Personal history of nicotine dependence: Secondary | ICD-10-CM | POA: Diagnosis not present

## 2017-08-14 DIAGNOSIS — J449 Chronic obstructive pulmonary disease, unspecified: Secondary | ICD-10-CM | POA: Diagnosis not present

## 2017-08-31 DIAGNOSIS — R0602 Shortness of breath: Secondary | ICD-10-CM | POA: Diagnosis not present

## 2017-08-31 DIAGNOSIS — J441 Chronic obstructive pulmonary disease with (acute) exacerbation: Secondary | ICD-10-CM | POA: Diagnosis not present

## 2017-09-02 ENCOUNTER — Ambulatory Visit: Payer: Medicare HMO | Admitting: Gastroenterology

## 2017-10-01 DIAGNOSIS — J441 Chronic obstructive pulmonary disease with (acute) exacerbation: Secondary | ICD-10-CM | POA: Diagnosis not present

## 2017-10-01 DIAGNOSIS — Z5181 Encounter for therapeutic drug level monitoring: Secondary | ICD-10-CM | POA: Diagnosis not present

## 2017-10-01 DIAGNOSIS — R0602 Shortness of breath: Secondary | ICD-10-CM | POA: Diagnosis not present

## 2017-10-20 ENCOUNTER — Ambulatory Visit: Payer: Medicare HMO | Admitting: Gastroenterology

## 2017-10-31 DIAGNOSIS — R0602 Shortness of breath: Secondary | ICD-10-CM | POA: Diagnosis not present

## 2017-10-31 DIAGNOSIS — J441 Chronic obstructive pulmonary disease with (acute) exacerbation: Secondary | ICD-10-CM | POA: Diagnosis not present

## 2017-12-01 DIAGNOSIS — R0602 Shortness of breath: Secondary | ICD-10-CM | POA: Diagnosis not present

## 2017-12-01 DIAGNOSIS — J441 Chronic obstructive pulmonary disease with (acute) exacerbation: Secondary | ICD-10-CM | POA: Diagnosis not present

## 2017-12-31 DIAGNOSIS — J441 Chronic obstructive pulmonary disease with (acute) exacerbation: Secondary | ICD-10-CM | POA: Diagnosis not present

## 2017-12-31 DIAGNOSIS — R0602 Shortness of breath: Secondary | ICD-10-CM | POA: Diagnosis not present

## 2018-01-14 ENCOUNTER — Other Ambulatory Visit: Payer: Self-pay

## 2018-01-14 ENCOUNTER — Emergency Department (HOSPITAL_COMMUNITY)
Admission: EM | Admit: 2018-01-14 | Discharge: 2018-01-14 | Discharge status: Against Medical Advice | Payer: Medicare HMO | Attending: Emergency Medicine | Admitting: Emergency Medicine

## 2018-01-14 ENCOUNTER — Encounter (HOSPITAL_COMMUNITY): Payer: Self-pay | Admitting: Emergency Medicine

## 2018-01-14 DIAGNOSIS — R0902 Hypoxemia: Secondary | ICD-10-CM | POA: Diagnosis not present

## 2018-01-14 DIAGNOSIS — R112 Nausea with vomiting, unspecified: Secondary | ICD-10-CM | POA: Diagnosis not present

## 2018-01-14 DIAGNOSIS — Z5321 Procedure and treatment not carried out due to patient leaving prior to being seen by health care provider: Secondary | ICD-10-CM | POA: Insufficient documentation

## 2018-01-14 DIAGNOSIS — R05 Cough: Secondary | ICD-10-CM | POA: Diagnosis not present

## 2018-01-14 DIAGNOSIS — R1111 Vomiting without nausea: Secondary | ICD-10-CM | POA: Diagnosis not present

## 2018-01-14 DIAGNOSIS — R509 Fever, unspecified: Secondary | ICD-10-CM | POA: Diagnosis not present

## 2018-01-14 LAB — COMPREHENSIVE METABOLIC PANEL
ALK PHOS: 85 U/L (ref 38–126)
ALT: 8 U/L (ref 0–44)
ANION GAP: 9 (ref 5–15)
AST: 15 U/L (ref 15–41)
Albumin: 4 g/dL (ref 3.5–5.0)
BILIRUBIN TOTAL: 0.7 mg/dL (ref 0.3–1.2)
BUN: 12 mg/dL (ref 8–23)
CALCIUM: 9.3 mg/dL (ref 8.9–10.3)
CO2: 28 mmol/L (ref 22–32)
CREATININE: 0.94 mg/dL (ref 0.44–1.00)
Chloride: 91 mmol/L — ABNORMAL LOW (ref 98–111)
GFR, EST NON AFRICAN AMERICAN: 60 mL/min — AB (ref >60)
Glucose, Bld: 111 mg/dL — ABNORMAL HIGH (ref 70–99)
Potassium: 5.1 mmol/L (ref 3.5–5.1)
Sodium: 128 mmol/L — ABNORMAL LOW (ref 135–145)
TOTAL PROTEIN: 6.4 g/dL — AB (ref 6.5–8.1)

## 2018-01-14 LAB — CBC
HCT: 34.9 % — ABNORMAL LOW (ref 36.0–46.0)
Hemoglobin: 11.7 g/dL — ABNORMAL LOW (ref 12.0–15.0)
MCH: 30.6 pg (ref 26.0–34.0)
MCHC: 33.5 g/dL (ref 30.0–36.0)
MCV: 91.4 fL (ref 80.0–100.0)
PLATELETS: 197 10*3/uL (ref 150–400)
RBC: 3.82 MIL/uL — AB (ref 3.87–5.11)
RDW: 12.9 % (ref 11.5–15.5)
WBC: 5 10*3/uL (ref 4.0–10.5)
nRBC: 0 % (ref 0.0–0.2)

## 2018-01-14 LAB — LIPASE, BLOOD: Lipase: 24 U/L (ref 11–51)

## 2018-01-14 NOTE — ED Notes (Signed)
Pt verbalizes cannot wait any longer; informed pt risks of leaving without being screened by a medical provider; pt verbalizes understanding.

## 2018-01-14 NOTE — ED Triage Notes (Signed)
Per EMS pt complaint of chills, cough, n/v without diarrhea for 2 days.

## 2018-01-14 NOTE — ED Notes (Signed)
Daughter [name] [phone]. Please call with condition update.

## 2018-01-23 DIAGNOSIS — Z5181 Encounter for therapeutic drug level monitoring: Secondary | ICD-10-CM | POA: Diagnosis not present

## 2018-01-23 DIAGNOSIS — E785 Hyperlipidemia, unspecified: Secondary | ICD-10-CM | POA: Diagnosis not present

## 2018-01-23 DIAGNOSIS — I10 Essential (primary) hypertension: Secondary | ICD-10-CM | POA: Diagnosis not present

## 2018-01-27 DIAGNOSIS — F329 Major depressive disorder, single episode, unspecified: Secondary | ICD-10-CM | POA: Diagnosis not present

## 2018-01-27 DIAGNOSIS — I10 Essential (primary) hypertension: Secondary | ICD-10-CM | POA: Diagnosis not present

## 2018-01-27 DIAGNOSIS — Z23 Encounter for immunization: Secondary | ICD-10-CM | POA: Diagnosis not present

## 2018-01-27 DIAGNOSIS — J449 Chronic obstructive pulmonary disease, unspecified: Secondary | ICD-10-CM | POA: Diagnosis not present

## 2018-01-27 DIAGNOSIS — I4891 Unspecified atrial fibrillation: Secondary | ICD-10-CM | POA: Diagnosis not present

## 2018-01-27 DIAGNOSIS — K219 Gastro-esophageal reflux disease without esophagitis: Secondary | ICD-10-CM | POA: Diagnosis not present

## 2018-01-27 DIAGNOSIS — E785 Hyperlipidemia, unspecified: Secondary | ICD-10-CM | POA: Diagnosis not present

## 2018-01-27 DIAGNOSIS — F39 Unspecified mood [affective] disorder: Secondary | ICD-10-CM | POA: Diagnosis not present

## 2018-01-27 DIAGNOSIS — M549 Dorsalgia, unspecified: Secondary | ICD-10-CM | POA: Diagnosis not present

## 2018-01-27 DIAGNOSIS — I2510 Atherosclerotic heart disease of native coronary artery without angina pectoris: Secondary | ICD-10-CM | POA: Diagnosis not present

## 2018-01-31 DIAGNOSIS — J441 Chronic obstructive pulmonary disease with (acute) exacerbation: Secondary | ICD-10-CM | POA: Diagnosis not present

## 2018-01-31 DIAGNOSIS — R0602 Shortness of breath: Secondary | ICD-10-CM | POA: Diagnosis not present

## 2018-03-03 DIAGNOSIS — R0602 Shortness of breath: Secondary | ICD-10-CM | POA: Diagnosis not present

## 2018-03-03 DIAGNOSIS — J441 Chronic obstructive pulmonary disease with (acute) exacerbation: Secondary | ICD-10-CM | POA: Diagnosis not present

## 2018-03-24 ENCOUNTER — Ambulatory Visit: Payer: Medicare HMO | Admitting: Cardiology

## 2018-03-25 ENCOUNTER — Inpatient Hospital Stay (HOSPITAL_COMMUNITY)
Admission: EM | Admit: 2018-03-25 | Discharge: 2018-03-28 | DRG: 190 | Disposition: A | Discharge status: Home / Self Care | Payer: Medicare HMO | Attending: Internal Medicine | Admitting: Internal Medicine

## 2018-03-25 ENCOUNTER — Encounter: Payer: Self-pay | Admitting: Cardiology

## 2018-03-25 DIAGNOSIS — Z7951 Long term (current) use of inhaled steroids: Secondary | ICD-10-CM | POA: Diagnosis not present

## 2018-03-25 DIAGNOSIS — G8929 Other chronic pain: Secondary | ICD-10-CM | POA: Diagnosis present

## 2018-03-25 DIAGNOSIS — E861 Hypovolemia: Secondary | ICD-10-CM | POA: Diagnosis present

## 2018-03-25 DIAGNOSIS — I4891 Unspecified atrial fibrillation: Secondary | ICD-10-CM | POA: Diagnosis present

## 2018-03-25 DIAGNOSIS — I2510 Atherosclerotic heart disease of native coronary artery without angina pectoris: Secondary | ICD-10-CM | POA: Diagnosis present

## 2018-03-25 DIAGNOSIS — E785 Hyperlipidemia, unspecified: Secondary | ICD-10-CM | POA: Diagnosis present

## 2018-03-25 DIAGNOSIS — J80 Acute respiratory distress syndrome: Secondary | ICD-10-CM | POA: Diagnosis not present

## 2018-03-25 DIAGNOSIS — Z87891 Personal history of nicotine dependence: Secondary | ICD-10-CM

## 2018-03-25 DIAGNOSIS — N289 Disorder of kidney and ureter, unspecified: Secondary | ICD-10-CM | POA: Diagnosis present

## 2018-03-25 DIAGNOSIS — E871 Hypo-osmolality and hyponatremia: Secondary | ICD-10-CM | POA: Diagnosis present

## 2018-03-25 DIAGNOSIS — I340 Nonrheumatic mitral (valve) insufficiency: Secondary | ICD-10-CM | POA: Diagnosis not present

## 2018-03-25 DIAGNOSIS — R509 Fever, unspecified: Secondary | ICD-10-CM | POA: Diagnosis not present

## 2018-03-25 DIAGNOSIS — Z8249 Family history of ischemic heart disease and other diseases of the circulatory system: Secondary | ICD-10-CM | POA: Diagnosis not present

## 2018-03-25 DIAGNOSIS — I5023 Acute on chronic systolic (congestive) heart failure: Secondary | ICD-10-CM | POA: Diagnosis present

## 2018-03-25 DIAGNOSIS — G9340 Encephalopathy, unspecified: Secondary | ICD-10-CM | POA: Diagnosis present

## 2018-03-25 DIAGNOSIS — K219 Gastro-esophageal reflux disease without esophagitis: Secondary | ICD-10-CM | POA: Diagnosis present

## 2018-03-25 DIAGNOSIS — F1190 Opioid use, unspecified, uncomplicated: Secondary | ICD-10-CM | POA: Diagnosis present

## 2018-03-25 DIAGNOSIS — R531 Weakness: Secondary | ICD-10-CM | POA: Diagnosis not present

## 2018-03-25 DIAGNOSIS — E872 Acidosis: Secondary | ICD-10-CM | POA: Diagnosis present

## 2018-03-25 DIAGNOSIS — I110 Hypertensive heart disease with heart failure: Secondary | ICD-10-CM | POA: Diagnosis present

## 2018-03-25 DIAGNOSIS — R0602 Shortness of breath: Secondary | ICD-10-CM | POA: Diagnosis present

## 2018-03-25 DIAGNOSIS — J9622 Acute and chronic respiratory failure with hypercapnia: Secondary | ICD-10-CM | POA: Diagnosis present

## 2018-03-25 DIAGNOSIS — R0902 Hypoxemia: Secondary | ICD-10-CM

## 2018-03-25 DIAGNOSIS — R296 Repeated falls: Secondary | ICD-10-CM | POA: Diagnosis present

## 2018-03-25 DIAGNOSIS — Z79899 Other long term (current) drug therapy: Secondary | ICD-10-CM

## 2018-03-25 DIAGNOSIS — J9621 Acute and chronic respiratory failure with hypoxia: Secondary | ICD-10-CM | POA: Diagnosis present

## 2018-03-25 DIAGNOSIS — Z801 Family history of malignant neoplasm of trachea, bronchus and lung: Secondary | ICD-10-CM | POA: Diagnosis not present

## 2018-03-25 DIAGNOSIS — J441 Chronic obstructive pulmonary disease with (acute) exacerbation: Secondary | ICD-10-CM | POA: Diagnosis present

## 2018-03-25 DIAGNOSIS — R05 Cough: Secondary | ICD-10-CM | POA: Diagnosis not present

## 2018-03-25 HISTORY — DX: Hypoxemia: R09.02

## 2018-03-25 MED ORDER — SODIUM CHLORIDE 0.9% FLUSH: 3.0000 mL | Freq: Once | INTRAVENOUS | Status: DC

## 2018-03-25 MED ORDER — ALBUTEROL SULFATE (2.5 MG/3ML) 0.083% IN NEBU
5.0000 mg | INHALATION_SOLUTION | Freq: Once | RESPIRATORY_TRACT | Status: AC
  Administered 2018-03-26: 5 mg via RESPIRATORY_TRACT
  Filled 2018-03-25: qty 6

## 2018-03-25 NOTE — ED Triage Notes (Signed)
Pt EMS, pt's family called due to SOB all day and recent not responding as she normally does - lethargic. Fire found her at 68% RA, given 5 of albuterol, .5atrovent and 125 solumedrol. She is now alert and oriented, 100% RA.  Family reports new tremor.

## 2018-03-26 ENCOUNTER — Encounter (HOSPITAL_COMMUNITY): Payer: Self-pay | Admitting: Emergency Medicine

## 2018-03-26 ENCOUNTER — Emergency Department (HOSPITAL_COMMUNITY): Payer: Medicare HMO

## 2018-03-26 ENCOUNTER — Observation Stay (HOSPITAL_BASED_OUTPATIENT_CLINIC_OR_DEPARTMENT_OTHER): Payer: Medicare HMO

## 2018-03-26 DIAGNOSIS — I340 Nonrheumatic mitral (valve) insufficiency: Secondary | ICD-10-CM | POA: Diagnosis not present

## 2018-03-26 DIAGNOSIS — J9622 Acute and chronic respiratory failure with hypercapnia: Secondary | ICD-10-CM

## 2018-03-26 DIAGNOSIS — I5023 Acute on chronic systolic (congestive) heart failure: Secondary | ICD-10-CM

## 2018-03-26 DIAGNOSIS — J441 Chronic obstructive pulmonary disease with (acute) exacerbation: Principal | ICD-10-CM

## 2018-03-26 DIAGNOSIS — N289 Disorder of kidney and ureter, unspecified: Secondary | ICD-10-CM

## 2018-03-26 DIAGNOSIS — R0902 Hypoxemia: Secondary | ICD-10-CM

## 2018-03-26 DIAGNOSIS — J9621 Acute and chronic respiratory failure with hypoxia: Secondary | ICD-10-CM

## 2018-03-26 HISTORY — DX: Hypoxemia: R09.02

## 2018-03-26 LAB — TROPONIN I
Troponin I: <0.03 ng/mL (ref <0.03)
Troponin I: <0.03 ng/mL (ref <0.03)

## 2018-03-26 LAB — BASIC METABOLIC PANEL
Anion gap: 13 (ref 5–15)
Anion gap: 9 (ref 5–15)
BUN: 20 mg/dL (ref 8–23)
BUN: 19 mg/dL (ref 8–23)
CO2: 23 mmol/L (ref 22–32)
CO2: 28 mmol/L (ref 22–32)
CREATININE: 1.08 mg/dL — AB (ref 0.44–1.00)
Calcium: 8.8 mg/dL — ABNORMAL LOW (ref 8.9–10.3)
Calcium: 8.7 mg/dL — ABNORMAL LOW (ref 8.9–10.3)
Chloride: 96 mmol/L — ABNORMAL LOW (ref 98–111)
Chloride: 93 mmol/L — ABNORMAL LOW (ref 98–111)
Creatinine, Ser: 1.27 mg/dL — ABNORMAL HIGH (ref 0.44–1.00)
GFR calc Af Amer: 48 mL/min — ABNORMAL LOW (ref >60)
GFR calc Af Amer: 59 mL/min — ABNORMAL LOW (ref >60)
GFR calc non Af Amer: 42 mL/min — ABNORMAL LOW (ref >60)
GFR calc non Af Amer: 51 mL/min — ABNORMAL LOW (ref >60)
Glucose, Bld: 157 mg/dL — ABNORMAL HIGH (ref 70–99)
Glucose, Bld: 132 mg/dL — ABNORMAL HIGH (ref 70–99)
Potassium: 4.8 mmol/L (ref 3.5–5.1)
Potassium: 4.7 mmol/L (ref 3.5–5.1)
Sodium: 132 mmol/L — ABNORMAL LOW (ref 135–145)
Sodium: 130 mmol/L — ABNORMAL LOW (ref 135–145)

## 2018-03-26 LAB — CBC WITH DIFFERENTIAL/PLATELET
Abs Immature Granulocytes: 0.06 10*3/uL (ref 0.00–0.07)
Abs Immature Granulocytes: 0.08 10*3/uL — ABNORMAL HIGH (ref 0.00–0.07)
Basophils Absolute: 0 10*3/uL (ref 0.0–0.1)
Basophils Absolute: 0 10*3/uL (ref 0.0–0.1)
Basophils Relative: 0 %
Basophils Relative: 0 %
Eosinophils Absolute: 0 10*3/uL (ref 0.0–0.5)
Eosinophils Absolute: 0 10*3/uL (ref 0.0–0.5)
Eosinophils Relative: 0 %
Eosinophils Relative: 0 %
HCT: 32 % — ABNORMAL LOW (ref 36.0–46.0)
HCT: 30.3 % — ABNORMAL LOW (ref 36.0–46.0)
Hemoglobin: 10.1 g/dL — ABNORMAL LOW (ref 12.0–15.0)
Hemoglobin: 10.1 g/dL — ABNORMAL LOW (ref 12.0–15.0)
Immature Granulocytes: 1 %
Immature Granulocytes: 1 %
Lymphocytes Relative: 3 %
Lymphocytes Relative: 5 %
Lymphs Abs: 0.2 10*3/uL — ABNORMAL LOW (ref 0.7–4.0)
Lymphs Abs: 0.4 10*3/uL — ABNORMAL LOW (ref 0.7–4.0)
MCH: 29.1 pg (ref 26.0–34.0)
MCH: 29.8 pg (ref 26.0–34.0)
MCHC: 31.6 g/dL (ref 30.0–36.0)
MCHC: 33.3 g/dL (ref 30.0–36.0)
MCV: 92.2 fL (ref 80.0–100.0)
MCV: 89.4 fL (ref 80.0–100.0)
MONO ABS: 0.1 10*3/uL (ref 0.1–1.0)
MONO ABS: 0.1 10*3/uL (ref 0.1–1.0)
MONOS PCT: 1 %
Monocytes Relative: 1 %
Neutro Abs: 6.3 10*3/uL (ref 1.7–7.7)
Neutro Abs: 6.8 10*3/uL (ref 1.7–7.7)
Neutrophils Relative %: 95 %
Neutrophils Relative %: 93 %
Platelets: 186 10*3/uL (ref 150–400)
Platelets: 167 10*3/uL (ref 150–400)
RBC: 3.47 MIL/uL — ABNORMAL LOW (ref 3.87–5.11)
RBC: 3.39 MIL/uL — ABNORMAL LOW (ref 3.87–5.11)
RDW: 12.9 % (ref 11.5–15.5)
RDW: 12.8 % (ref 11.5–15.5)
WBC: 6.6 10*3/uL (ref 4.0–10.5)
WBC: 7.3 10*3/uL (ref 4.0–10.5)
nRBC: 0 % (ref 0.0–0.2)
nRBC: 0 % (ref 0.0–0.2)

## 2018-03-26 LAB — COMPREHENSIVE METABOLIC PANEL
ALK PHOS: 110 U/L (ref 38–126)
ALT: 11 U/L (ref 0–44)
AST: 30 U/L (ref 15–41)
Albumin: 3.5 g/dL (ref 3.5–5.0)
Anion gap: 10 (ref 5–15)
BILIRUBIN TOTAL: 0.6 mg/dL (ref 0.3–1.2)
BUN: 19 mg/dL (ref 8–23)
CALCIUM: 8.9 mg/dL (ref 8.9–10.3)
CO2: 27 mmol/L (ref 22–32)
Chloride: 96 mmol/L — ABNORMAL LOW (ref 98–111)
Creatinine, Ser: 1.35 mg/dL — ABNORMAL HIGH (ref 0.44–1.00)
GFR, EST AFRICAN AMERICAN: 45 mL/min — AB (ref >60)
GFR, EST NON AFRICAN AMERICAN: 39 mL/min — AB (ref >60)
Glucose, Bld: 159 mg/dL — ABNORMAL HIGH (ref 70–99)
Potassium: 5.5 mmol/L — ABNORMAL HIGH (ref 3.5–5.1)
Sodium: 133 mmol/L — ABNORMAL LOW (ref 135–145)
TOTAL PROTEIN: 6.2 g/dL — AB (ref 6.5–8.1)

## 2018-03-26 LAB — CBC
HCT: 36.5 % (ref 36.0–46.0)
Hemoglobin: 11.3 g/dL — ABNORMAL LOW (ref 12.0–15.0)
MCH: 29.4 pg (ref 26.0–34.0)
MCHC: 31 g/dL (ref 30.0–36.0)
MCV: 95.1 fL (ref 80.0–100.0)
NRBC: 0 % (ref 0.0–0.2)
PLATELETS: 220 10*3/uL (ref 150–400)
RBC: 3.84 MIL/uL — ABNORMAL LOW (ref 3.87–5.11)
RDW: 12.9 % (ref 11.5–15.5)
WBC: 11.4 10*3/uL — AB (ref 4.0–10.5)

## 2018-03-26 LAB — POCT I-STAT 7, (LYTES, BLD GAS, ICA,H+H)
Acid-base deficit: 1 mmol/L (ref 0.0–2.0)
Acid-base deficit: 2 mmol/L (ref 0.0–2.0)
BICARBONATE: 26.9 mmol/L (ref 20.0–28.0)
Bicarbonate: 28.1 mmol/L — ABNORMAL HIGH (ref 20.0–28.0)
CALCIUM ION: 1.24 mmol/L (ref 1.15–1.40)
Calcium, Ion: 1.25 mmol/L (ref 1.15–1.40)
HCT: 30 % — ABNORMAL LOW (ref 36.0–46.0)
HEMATOCRIT: 31 % — AB (ref 36.0–46.0)
HEMOGLOBIN: 10.5 g/dL — AB (ref 12.0–15.0)
Hemoglobin: 10.2 g/dL — ABNORMAL LOW (ref 12.0–15.0)
O2 Saturation: 99 %
O2 Saturation: 90 %
PCO2 ART: 68.6 mmHg — AB (ref 32.0–48.0)
PH ART: 7.217 — AB (ref 7.350–7.450)
Patient temperature: 97.3
Patient temperature: 97.3
Potassium: 4.9 mmol/L (ref 3.5–5.1)
Potassium: 4.5 mmol/L (ref 3.5–5.1)
Sodium: 132 mmol/L — ABNORMAL LOW (ref 135–145)
Sodium: 133 mmol/L — ABNORMAL LOW (ref 135–145)
TCO2: 30 mmol/L (ref 22–32)
TCO2: 29 mmol/L (ref 22–32)
pCO2 arterial: 64.9 mmHg — ABNORMAL HIGH (ref 32.0–48.0)
pH, Arterial: 7.22 — ABNORMAL LOW (ref 7.350–7.450)
pO2, Arterial: 159 mmHg — ABNORMAL HIGH (ref 83.0–108.0)
pO2, Arterial: 68 mmHg — ABNORMAL LOW (ref 83.0–108.0)

## 2018-03-26 LAB — BLOOD GAS, ARTERIAL
Acid-base deficit: 1.7 mmol/L (ref 0.0–2.0)
Bicarbonate: 24.3 mmol/L (ref 20.0–28.0)
DRAWN BY: 511471
Delivery systems: POSITIVE
Expiratory PAP: 6
FIO2: 0.4
Inspiratory PAP: 12
O2 Saturation: 94.6 %
Patient temperature: 98.7
pCO2 arterial: 54.8 mmHg — ABNORMAL HIGH (ref 32.0–48.0)
pH, Arterial: 7.27 — ABNORMAL LOW (ref 7.350–7.450)
pO2, Arterial: 67.5 mmHg — ABNORMAL LOW (ref 83.0–108.0)

## 2018-03-26 LAB — LACTIC ACID, PLASMA
Lactic Acid, Venous: 3.8 mmol/L (ref 0.5–1.9)
Lactic Acid, Venous: 4.2 mmol/L (ref 0.5–1.9)

## 2018-03-26 LAB — BRAIN NATRIURETIC PEPTIDE: B Natriuretic Peptide: 1025.1 pg/mL — ABNORMAL HIGH (ref 0.0–100.0)

## 2018-03-26 LAB — INFLUENZA PANEL BY PCR (TYPE A & B)
INFLBPCR: NEGATIVE
Influenza A By PCR: NEGATIVE

## 2018-03-26 LAB — ECHOCARDIOGRAM COMPLETE

## 2018-03-26 LAB — GLUCOSE, CAPILLARY: Glucose-Capillary: 133 mg/dL — ABNORMAL HIGH (ref 70–99)

## 2018-03-26 MED ORDER — ONDANSETRON HCL 4 MG PO TABS: 4.0000 mg | ORAL_TABLET | Freq: Four times a day (QID) | ORAL | Status: DC | PRN

## 2018-03-26 MED ORDER — GUAIFENESIN ER 600 MG PO TB12
600.0000 mg | ORAL_TABLET | Freq: Two times a day (BID) | ORAL | Status: DC
  Administered 2018-03-26 – 2018-03-28 (×5): 600 mg via ORAL
  Filled 2018-03-26 (×5): qty 1

## 2018-03-26 MED ORDER — POLYETHYLENE GLYCOL 3350 17 G PO PACK: 17.0000 g | PACK | Freq: Every day | ORAL | Status: DC | PRN

## 2018-03-26 MED ORDER — SODIUM CHLORIDE 0.9 % IV SOLN
500.0000 mg | INTRAVENOUS | Status: DC
  Administered 2018-03-26 – 2018-03-27 (×2): 500 mg via INTRAVENOUS
  Filled 2018-03-26: qty 500

## 2018-03-26 MED ORDER — SODIUM CHLORIDE 0.9% FLUSH
3.0000 mL | Freq: Two times a day (BID) | INTRAVENOUS | Status: DC
  Administered 2018-03-26 – 2018-03-27 (×3): 3 mL via INTRAVENOUS

## 2018-03-26 MED ORDER — METHYLPREDNISOLONE SODIUM SUCC 125 MG IJ SOLR
60.0000 mg | Freq: Four times a day (QID) | INTRAMUSCULAR | Status: DC
  Administered 2018-03-26 (×2): 60 mg via INTRAVENOUS
  Filled 2018-03-26 (×2): qty 2

## 2018-03-26 MED ORDER — FUROSEMIDE 10 MG/ML IJ SOLN
40.0000 mg | Freq: Two times a day (BID) | INTRAMUSCULAR | Status: DC
  Administered 2018-03-26: 40 mg via INTRAVENOUS
  Filled 2018-03-26: qty 4

## 2018-03-26 MED ORDER — ENOXAPARIN SODIUM 40 MG/0.4ML ~~LOC~~ SOLN
40.0000 mg | SUBCUTANEOUS | Status: DC
  Administered 2018-03-26: 40 mg via SUBCUTANEOUS
  Filled 2018-03-26: qty 0.4

## 2018-03-26 MED ORDER — METHYLPREDNISOLONE SODIUM SUCC 125 MG IJ SOLR
125.0000 mg | Freq: Once | INTRAMUSCULAR | Status: DC
  Administered 2018-03-26: 125 mg via INTRAVENOUS

## 2018-03-26 MED ORDER — BUDESONIDE 0.25 MG/2ML IN SUSP
0.2500 mg | Freq: Two times a day (BID) | RESPIRATORY_TRACT | Status: DC
  Administered 2018-03-26 – 2018-03-27 (×3): 0.25 mg via RESPIRATORY_TRACT
  Filled 2018-03-26 (×3): qty 2

## 2018-03-26 MED ORDER — ALBUTEROL SULFATE (2.5 MG/3ML) 0.083% IN NEBU: 2.5000 mg | INHALATION_SOLUTION | RESPIRATORY_TRACT | Status: DC | PRN

## 2018-03-26 MED ORDER — ACETAMINOPHEN 325 MG PO TABS
650.0000 mg | ORAL_TABLET | Freq: Four times a day (QID) | ORAL | Status: DC | PRN
  Administered 2018-03-26: 650 mg via ORAL
  Filled 2018-03-26: qty 2

## 2018-03-26 MED ORDER — SODIUM CHLORIDE 0.9% FLUSH: 3.0000 mL | INTRAVENOUS | Status: DC | PRN

## 2018-03-26 MED ORDER — METHYLPREDNISOLONE SODIUM SUCC 40 MG IJ SOLR
40.0000 mg | Freq: Four times a day (QID) | INTRAMUSCULAR | Status: DC
  Administered 2018-03-26 – 2018-03-27 (×4): 40 mg via INTRAVENOUS
  Filled 2018-03-26 (×4): qty 1

## 2018-03-26 MED ORDER — IPRATROPIUM BROMIDE 0.02 % IN SOLN
0.5000 mg | Freq: Four times a day (QID) | RESPIRATORY_TRACT | Status: DC
  Administered 2018-03-26 (×2): 0.5 mg via RESPIRATORY_TRACT
  Filled 2018-03-26 (×2): qty 2.5

## 2018-03-26 MED ORDER — ONDANSETRON HCL 4 MG/2ML IJ SOLN
4.0000 mg | Freq: Four times a day (QID) | INTRAMUSCULAR | Status: DC | PRN
  Filled 2018-03-26: qty 2

## 2018-03-26 MED ORDER — MORPHINE SULFATE (PF) 2 MG/ML IV SOLN
INTRAVENOUS | Status: AC
  Administered 2018-03-26: 2 mg via INTRAVENOUS
  Filled 2018-03-26: qty 1

## 2018-03-26 MED ORDER — SODIUM CHLORIDE 0.9 % IV BOLUS (SEPSIS): 1000.0000 mL | Freq: Once | INTRAVENOUS | Status: DC

## 2018-03-26 MED ORDER — MORPHINE SULFATE (PF) 2 MG/ML IV SOLN
2.0000 mg | Freq: Once | INTRAVENOUS | Status: AC
  Administered 2018-03-26: 2 mg via INTRAVENOUS

## 2018-03-26 MED ORDER — WHITE PETROLATUM EX OINT
TOPICAL_OINTMENT | CUTANEOUS | Status: AC
  Administered 2018-03-26: 1
  Filled 2018-03-26: qty 28.35

## 2018-03-26 MED ORDER — ALBUTEROL (5 MG/ML) CONTINUOUS INHALATION SOLN
15.0000 mg/h | INHALATION_SOLUTION | Freq: Once | RESPIRATORY_TRACT | Status: AC
  Administered 2018-03-26: 15 mg/h via RESPIRATORY_TRACT

## 2018-03-26 MED ORDER — SODIUM CHLORIDE 0.9 % IV SOLN: 250.0000 mL | INTRAVENOUS | Status: DC | PRN

## 2018-03-26 MED ORDER — ACETAMINOPHEN 650 MG RE SUPP: 650.0000 mg | Freq: Four times a day (QID) | RECTAL | Status: DC | PRN

## 2018-03-26 MED ORDER — IPRATROPIUM BROMIDE 0.02 % IN SOLN
0.5000 mg | Freq: Three times a day (TID) | RESPIRATORY_TRACT | Status: DC
  Administered 2018-03-26 – 2018-03-27 (×2): 0.5 mg via RESPIRATORY_TRACT
  Filled 2018-03-26 (×2): qty 2.5

## 2018-03-26 MED ORDER — LORAZEPAM 0.5 MG PO TABS
0.2500 mg | ORAL_TABLET | Freq: Three times a day (TID) | ORAL | Status: DC | PRN
  Administered 2018-03-26: 0.25 mg via ORAL
  Filled 2018-03-26: qty 1

## 2018-03-26 MED ORDER — FUROSEMIDE 40 MG PO TABS
40.0000 mg | ORAL_TABLET | Freq: Every day | ORAL | Status: DC
  Filled 2018-03-26: qty 1

## 2018-03-26 MED ORDER — IPRATROPIUM BROMIDE 0.02 % IN SOLN
1.0000 mg | Freq: Once | RESPIRATORY_TRACT | Status: AC
  Administered 2018-03-26: 1 mg via RESPIRATORY_TRACT
  Filled 2018-03-26: qty 5

## 2018-03-26 MED ORDER — LORAZEPAM 2 MG/ML IJ SOLN
0.5000 mg | Freq: Once | INTRAMUSCULAR | Status: AC
  Administered 2018-03-26: 0.5 mg via INTRAVENOUS
  Filled 2018-03-26: qty 1

## 2018-03-26 MED ORDER — HYDROCODONE-ACETAMINOPHEN 5-325 MG PO TABS
1.0000 | ORAL_TABLET | ORAL | Status: DC | PRN
  Administered 2018-03-26 (×4): 2 via ORAL
  Filled 2018-03-26: qty 2
  Filled 2018-03-26: qty 1
  Filled 2018-03-26 (×3): qty 2

## 2018-03-26 MED ORDER — SODIUM CHLORIDE 0.9% FLUSH
3.0000 mL | Freq: Two times a day (BID) | INTRAVENOUS | Status: DC
  Administered 2018-03-26 (×3): 3 mL via INTRAVENOUS

## 2018-03-26 NOTE — Plan of Care (Signed)
  Problem: Clinical Measurements: Goal: Respiratory complications will improve Outcome: Progressing   Problem: Coping: Goal: Level of anxiety will decrease Outcome: Progressing   Problem: Pain Managment: Goal: General experience of comfort will improve Outcome: Progressing   

## 2018-03-26 NOTE — Progress Notes (Signed)
Late note: RT obtained second ABG with the following results. MD aware of critical PCO2 of 64.9 of repeat ABG. Pt is transferring to 2W. RT notified receiving RT of MD desire to place pt on BiPAP d/t critical PCO2. RT will continue to monitor.   Results for Allison Vaughan, Allison Vaughan (MRN [id number]) as of 03/26/2018 04:52  Ref. Range 03/26/2018 02:56  Sample type Unknown ARTERIAL  pH, Arterial Latest Ref Range: 7.350 - 7.450  7.220 (L)  pCO2 arterial Latest Ref Range: 32.0 - 48.0 mmHg 64.9 (H)  pO2, Arterial Latest Ref Range: 83.0 - 108.0 mmHg 68.0 (L)  TCO2 Latest Ref Range: 22 - 32 mmol/L 29  Acid-base deficit Latest Ref Range: 0.0 - 2.0 mmol/L 2.0  Bicarbonate Latest Ref Range: 20.0 - 28.0 mmol/L 26.9  O2 Saturation Latest Units: % 90.0  Patient temperature Unknown 97.3 F

## 2018-03-26 NOTE — ED Provider Notes (Signed)
TIME SEEN: 12:17 AM  CHIEF COMPLAINT: Shortness of breath  HPI: Patient is a 73 year old female with history of CAD, atrial fibrillation, COPD not on home oxygen, chronic pain on methadone who presents to the emergency department for shortness of breath and cough.  Reports she has had chills for couple of days but no fever.  Has had cough with yellow/green sputum production.  Found to be hypoxic with sats 78% on room air.  Does not wear oxygen chronically.  Denies chest pain, vomiting or diarrhea.  No sick contacts or recent travel.  Patient is still a smoker.  EMS gave breathing treatments and IV Solu-Medrol.  ROS: See HPI Constitutional: no fever  Eyes: no drainage  ENT: no runny nose   Cardiovascular:  no chest pain  Resp:  SOB  GI: no vomiting GU: no dysuria Integumentary: no rash  Allergy: no hives  Musculoskeletal: no leg swelling  Neurological: no slurred speech ROS otherwise negative  PAST MEDICAL HISTORY/PAST SURGICAL HISTORY:  Past Medical History:  Diagnosis Date  . Accidental drug overdose   . Acute encephalopathy   . Acute on chronic respiratory failure with hypoxia ([place]) 03/01/2017  . Acute renal failure ([place]) 04/16/2014  . AKI (acute kidney injury) ([place]) 04/30/2014  . Altered mental status 06/27/2012  . Anxiety   . Atrial fibrillation ([place])   . CAD (coronary artery disease)   . CAP (community acquired pneumonia) 04/18/2014  . Cardiomyopathy ([place])   . Chronic hypoxemic respiratory failure ([place]) 06/26/2012  . Chronic pain   . COPD exacerbation ([place]) 06/26/2012  . COPD with acute exacerbation ([place]) 03/01/2017  . DEPRESSIVE DISORDER, NOS 03/13/2006   Qualifier: Diagnosis of  By: Damita Dunnings MD, Phillip Heal    . Edema, peripheral   . Essential hypertension 04/30/2014  . Fall at home, initial encounter 03/01/2017  . GASTROESOPHAGEAL REFLUX, NO ESOPHAGITIS 03/13/2006   Qualifier: Diagnosis of  By: Damita Dunnings MD, Phillip Heal    . HYPERCHOLESTEROLEMIA 03/13/2006   Qualifier: Diagnosis of  By:  Damita Dunnings MD, Phillip Heal    . Hyperlipidemia   . HYPERTENSION, BENIGN SYSTEMIC 03/13/2006   Qualifier: Diagnosis of  By: Damita Dunnings MD, Phillip Heal    . Hypokalemia 04/30/2014  . Hyponatremia 04/30/2014  . Hypotension 04/30/2014  . Hypoxemia 06/26/2012  . Irritable bowel syndrome 03/13/2006   Qualifier: Diagnosis of  By: Damita Dunnings MD, Phillip Heal    . Lung nodule 04/16/2014  . MENOPAUSAL SYNDROME 03/13/2006   Qualifier: Diagnosis of  By: Damita Dunnings MD, Phillip Heal    . Mood disorder [facility]) 03/13/2006   Qualifier: Diagnosis of  By: Damita Dunnings MD, Phillip Heal    . 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 Nausea 04/30/2014  . Nausea vomiting and diarrhea 04/16/2014  . OSTEOARTHRITIS, MULTI SITES 03/13/2006   Qualifier: Diagnosis of  By: Damita Dunnings MD, Phillip Heal    . Pulmonary nodule 04/30/2014  . Respiratory failure ([place]) 01/22/2017  . TOBACCO DEPENDENCE 03/13/2006   Qualifier: Diagnosis of  By: Damita Dunnings MD, Phillip Heal    . Weight loss     MEDICATIONS:  Prior to Admission medications   Medication Sig Start Date End Date Taking? Authorizing Provider  albuterol (PROVENTIL HFA;VENTOLIN HFA) 108 (90 BASE) MCG/ACT inhaler Inhale 2 puffs into the lungs every 6 (six) hours as needed for wheezing. 06/09/12   Monika Salk, MD  albuterol (PROVENTIL) (2.5 MG/3ML) 0.083% nebulizer solution Take 3 mLs (2.5 mg total) by nebulization every 2 (two) hours as needed for wheezing. 04/20/14   Rama, Venetia Maxon, MD  amLODipine (NORVASC) 10 MG  tablet Take 1 tablet (10 mg total) by mouth daily. 05/02/14   Kelvin Cellar, MD  bisacodyl (DULCOLAX) 10 MG suppository Place 10 mg rectally as needed for moderate constipation.    [provider]  budesonide (PULMICORT) 0.25 MG/2ML nebulizer solution Take 2 mLs (0.25 mg total) by nebulization 2 (two) times daily. Patient not taking: Reported on 03/01/2017 04/20/14   Rama, Venetia Maxon, MD  feeding supplement, ENSURE ENLIVE, (ENSURE ENLIVE) LIQD Take 237 mLs by mouth 2 (two) times daily between meals. 03/03/17   Aline August, MD   FLUoxetine (PROZAC) 40 MG capsule Take 40 mg by mouth every morning.     [provider]  guaiFENesin (MUCINEX) 600 MG 12 hr tablet Take 1 tablet (600 mg total) by mouth 2 (two) times daily. 03/03/17   Aline August, MD  ipratropium (ATROVENT) 0.02 % nebulizer solution Take 2.5 mLs (0.5 mg total) by nebulization 4 (four) times daily. Patient not taking: Reported on 03/01/2017 04/20/14   Rama, Venetia Maxon, MD  methadone (DOLOPHINE) 1 MG/1ML solution Take 60 mg by mouth daily. [facility].     [provider]  potassium chloride (K-DUR,KLOR-CON) 10 MEQ tablet Take 10 mEq by mouth daily. 02/11/17   [provider]  simvastatin (ZOCOR) 20 MG tablet Take 20 mg by mouth at bedtime.  02/16/14   [provider]    ALLERGIES:  No Known Allergies  SOCIAL HISTORY:  Social History   Tobacco Use  . Smoking status: Former Smoker    Packs/day: 1.00    Types: Cigarettes    Last attempt to quit: 03/16/2017    Years since quitting: 1.0  . Smokeless tobacco: Never Used  Substance Use Topics  . Alcohol use: No    Frequency: Never    Comment: not in a long time    FAMILY HISTORY: Family History  Problem Relation Age of Onset  . CAD Father   . Lung cancer Brother   . Healthy Son   . Healthy Daughter   . Healthy Daughter     EXAM: BP 96/65   Pulse 70   Temp (!) 97.3 F (36.3 C) (Rectal)   Resp 18   SpO2 100%  CONSTITUTIONAL: Alert and oriented and responds appropriately to questions.  Thin, chronically ill-appearing HEAD: Normocephalic EYES: Conjunctivae clear, pupils appear equal, EOMI ENT: normal nose; moist mucous membranes NECK: Supple, no meningismus, no nuchal rigidity, no LAD  CARD: RRR; S1 and S2 appreciated; no murmurs, no clicks, no rubs, no gallops RESP: Patient is able to speak full sentences.  Hypoxic on room air.  Diminished aeration diffusely with rhonchorous breath sounds and diffuse scattered wheezes.  No respiratory  distress. ABD/GI: Normal bowel sounds; non-distended; soft, non-tender, no rebound, no guarding, no peritoneal signs, no hepatosplenomegaly BACK:  The back appears normal and is non-tender to palpation, there is no CVA tenderness EXT: Normal ROM in all joints; non-tender to palpation; no edema; normal capillary refill; no cyanosis, no calf tenderness or swelling    SKIN: Normal color for age and race; warm; no rash NEURO: Moves all extremities equally PSYCH: The patient's mood and manner are appropriate. Grooming and personal hygiene are appropriate.  MEDICAL DECISION MAKING: Patient here likely with COPD exacerbation.  Mildly hypotensive here but this may be her baseline.  Patient is not sure what her blood pressure normally runs.  She is afebrile here today.  Will obtain labs, x-ray.  EKG shows no ischemic changes and she denies chest pain.  Currently receiving breathing treatment.  Anticipate admission given new oxygen requirement.  Will give IV fluids for her hypotension.  Last echocardiogram in February 2020 showed EF of 40 to 45%.  ED PROGRESS: Labs ordered by nursing staff show a leukocytosis of 11.4.  Potassium mildly elevated at 5.5 but she is receiving albuterol and she has no EKG changes consistent with hyperkalemia.  Her lactate is elevated but I suspect this is likely secondary to her respiratory status.  Her chest x-ray shows right basilar opacities consistent with edema versus bronchitic changes.  Will add on troponin and BNP.  She does not appear volume overloaded on exam.  Will discuss with medicine for admission.  1:20 AM Discussed patient's case with hospitalist, Dr. Myna Hidalgo.  I have recommended admission and patient (and family if present) agree with this plan. Admitting physician will place admission orders.   I reviewed all nursing notes, vitals, pertinent previous records, EKGs, lab and urine results, imaging (as available).     EKG Interpretation  Date/Time:  Wednesday  March 25 2018 23:58:26 EDT Ventricular Rate:  74 PR Interval:    QRS Duration: 104 QT Interval:  426 QTC Calculation: 473 R Axis:   114 Text Interpretation:  Normal sinus rhythm Left posterior fascicular block Anteroseptal infarct, age indeterminate Artifact in lead(s) I III aVR aVL V1 V2 No significant change since last tracing Confirmed by Pryor Curia [phone]) on 03/26/2018 12:16:58 AM        CRITICAL CARE Performed by: Cyril Mourning Ward   Total critical care time: 65 minutes  Critical care time was exclusive of separately billable procedures and treating other patients.  Critical care was necessary to treat or prevent imminent or life-threatening deterioration.  Critical care was time spent personally by me on the following activities: development of treatment plan with patient and/or surrogate as well as nursing, discussions with consultants, evaluation of patient's response to treatment, examination of patient, obtaining history from patient or surrogate, ordering and performing treatments and interventions, ordering and review of laboratory studies, ordering and review of radiographic studies, pulse oximetry and re-evaluation of patient's condition.     Ward, Delice Bison, DO 03/26/18 0120

## 2018-03-26 NOTE — Progress Notes (Signed)
Occupational Therapy Evaluation Patient Details Name: Allison Vaughan MRN: [id number] DOB: 1945-05-03 Today's Date: 03/26/2018    History of Present Illness Patient is 73 year old female with history of COPD, ongoing smoker, chronic systolic heart failure with known ejection fraction of 45%, hypertension, chronic pain who is admitted from emergency room early morning hours where she presented with acute respiratory distress.    Clinical Impression   PTA, pt was living at home with her daughter, who assisted pt with ADL, pt reports she was independent with functional mobility. Pt reports daughter is home at all times. Pt currently requires minguard-minA for ADL and functional mobility. Pt appeared anxious during session, when asked pt was unable to identify a source of anxiety. VSS throughout session. Due to decline in current level of function, pt would benefit from acute OT to address established goals to facilitate safe D/C home. At this time, recommend HHOT follow-up. Will continue to follow acutely.       Follow Up Recommendations  Home health OT;Supervision/Assistance - 24 hour    Equipment Recommendations  None recommended by OT    Recommendations for Other Services PT consult     Precautions / Restrictions Precautions Precautions: Fall Restrictions Weight Bearing Restrictions: No      Mobility Bed Mobility Overal bed mobility: Modified Independent                Transfers Overall transfer level: Needs assistance   Transfers: Sit to/from Stand Sit to Stand: Min guard;Min assist         General transfer comment: min guard to minA for sit<>stand based on pt's pace    Balance Overall balance assessment: Mild deficits observed, not formally tested                                         ADL either performed or assessed with clinical judgement   ADL Overall ADL's : Needs assistance/impaired Eating/Feeding: Set up   Grooming: Min  guard   Upper Body Bathing: Min guard;Standing   Lower Body Bathing: Minimal assistance   Upper Body Dressing : Min guard;Standing   Lower Body Dressing: Minimal assistance;Sit to/from stand Lower Body Dressing Details (indicate cue type and reason): pt required assistance donning R sock, able to don L sock Toilet Transfer: Min guard;Minimal assistance;Ambulation Toilet Transfer Details (indicate cue type and reason): min guard progressing to minA for stability as pt turned to sit on commode quickly and had minor LOB Toileting- Clothing Manipulation and Hygiene: Min guard       Functional mobility during ADLs: Min guard;Minimal assistance       Vision         Perception     Praxis      Pertinent Vitals/Pain Pain Assessment: 0-10 Pain Score: 6  Pain Location: back, headache Pain Descriptors / Indicators: Sore;Constant Pain Intervention(s): Limited activity within patient's tolerance     Hand Dominance Right   Extremity/Trunk Assessment Upper Extremity Assessment Upper Extremity Assessment: Generalized weakness   Lower Extremity Assessment Lower Extremity Assessment: Defer to PT evaluation   Cervical / Trunk Assessment Cervical / Trunk Assessment: Normal   Communication Communication Communication: No difficulties   Cognition Arousal/Alertness: Awake/alert Behavior During Therapy: Anxious Overall Cognitive Status: No family/caregiver present to determine baseline cognitive functioning Area of Impairment: Safety/judgement;Awareness  Safety/Judgement: Decreased awareness of safety;Decreased awareness of deficits Awareness: Emergent   General Comments: pt appeared anxious during session, unknown source. pt appeared to have decreased awareness of line management, was impulsive with mobility    General Comments       Exercises     Shoulder Instructions      Home Living Family/patient expects to be discharged to:: Private  residence Living Arrangements: Children Available Help at Discharge: Family;Available 24 hours/day Type of Home: House Home Access: Level entry     Home Layout: One level     Bathroom Shower/Tub: Teacher, early years/pre: Standard Bathroom Accessibility: Yes How Accessible: Accessible via walker Home Equipment: Grab bars - toilet;Grab bars - tub/shower;Shower seat          Prior Functioning/Environment Level of Independence: Needs assistance  Gait / Transfers Assistance Needed: pt reports she was ambulatory without AD ADL's / Homemaking Assistance Needed: pt required assistance from her daughter for all ADL   Comments: pt reports previous falls        OT Problem List: Decreased activity tolerance;Impaired balance (sitting and/or standing);Decreased coordination;Decreased cognition;Decreased safety awareness;Decreased knowledge of use of DME or AE;Cardiopulmonary status limiting activity;Pain      OT Treatment/Interventions: Self-care/ADL training;Therapeutic exercise;Neuromuscular education;DME and/or AE instruction;Energy conservation;Therapeutic activities;Patient/family education;Balance training    OT Goals(Current goals can be found in the care plan section) Acute Rehab OT Goals Patient Stated Goal: to go home OT Goal Formulation: With patient Time For Goal Achievement: 04/09/18 Potential to Achieve Goals: Good ADL Goals Pt Will Perform Grooming: with modified independence;standing Pt Will Perform Upper Body Bathing: with modified independence;sitting Pt Will Perform Lower Body Dressing: with modified independence;sit to/from stand Pt Will Transfer to Toilet: with modified independence;ambulating  OT Frequency: Min 2X/week   Barriers to D/C:            Co-evaluation              AM-PAC OT "6 Clicks" Daily Activity     Outcome Measure Help from another person eating meals?: None Help from another person taking care of personal grooming?: A  Little Help from another person toileting, which includes using toliet, bedpan, or urinal?: A Little Help from another person bathing (including washing, rinsing, drying)?: A Little Help from another person to put on and taking off regular upper body clothing?: A Little Help from another person to put on and taking off regular lower body clothing?: A Little 6 Click Score: 19   End of Session Equipment Utilized During Treatment: Oxygen(2lnc) Nurse Communication: Mobility status  Activity Tolerance: Patient tolerated treatment well Patient left: in chair;with call bell/phone within reach  OT Visit Diagnosis: Unsteadiness on feet (R26.81);Other abnormalities of gait and mobility (R26.89);Muscle weakness (generalized) (M62.81);History of falling (Z91.81);Repeated falls (R29.6);Pain;Other symptoms and signs involving cognitive function Pain - part of body: (back and headache)                Time: 1526-1550 OT Time Calculation (min): 24 min Charges:  OT General Charges $OT Visit: 1 Visit OT Evaluation $OT Eval Moderate Complexity: 1 Mod OT Treatments $Self Care/Home Management : 8-22 mins  Dorinda Hill OTR/L Acute Rehabilitation Services Office: [place] 03/26/2018, 4:03 PM

## 2018-03-26 NOTE — Progress Notes (Signed)
  Echocardiogram 2D Echocardiogram has been performed.  Allison Vaughan L Androw 03/26/2018, 10:21 AM

## 2018-03-26 NOTE — H&P (Signed)
History and Physical    Allison Vaughan IAX:[id number] DOB: Aug 12, 1945 DOA: 03/25/2018  PCP: Antonietta Jewel, MD   Patient coming from: Home   Chief Complaint: SOB, AMS   HPI: Allison Vaughan is a 73 y.o. female with medical history significant for COPD, chronic systolic CHF, hypertension, and chronic pain, now presenting to the emergency department for evaluation of acute respiratory distress.  Patient reports recent increase in productive cough and shortness of breath, has had some lower extremity edema, orthopnea, and reports that she has "not been using my medications like I am supposed to."  She denies any fevers or chills, reports back and shoulder pain that is chronic, but denies any chest pain.  She was in acute distress tonight, was lethargic per report of family, and EMS was called.  Patient was found to have saturations in the upper 60s with EMS, was treated with supplemental oxygen, duo nebs, and 125 mg of IV Solu-Medrol.  She improved prior to arrival in the ED, becoming more alert, fully oriented, and with decreased work of breathing.  ED Course: Upon arrival to the ED, patient is found to be afebrile, saturating 86%, vitals otherwise stable.  EKG features a sinus rhythm with LP FB.  Chest x-ray is notable for increased reticular opacity with basilar predominance, possibly suggesting interstitial edema or bronchitic changes.  Chemistry panel is notable for sodium of 133, potassium 5.5, and creatinine 1.35, up from 0.94 in January.  CBC features a mild leukocytosis.  Lactic acid was elevated to 3.8.  BNP is elevated to 1025, previously normal.  Patient was given additional albuterol and Atrovent in the ED.  She has improved, but continues to require supplemental oxygen at rest and will be admitted for further evaluation and management.  Review of Systems:  All other systems reviewed and apart from HPI, are negative.  Past Medical History:  Diagnosis Date   Accidental drug overdose     Acute encephalopathy    Acute on chronic respiratory failure with hypoxia ([place]) 03/01/2017   Acute renal failure ([place]) 04/16/2014   AKI (acute kidney injury) ([place]) 04/30/2014   Altered mental status 06/27/2012   Anxiety    Atrial fibrillation ([facility])    CAD (coronary artery disease)    CAP (community acquired pneumonia) 04/18/2014   Cardiomyopathy ([place])    Chronic hypoxemic respiratory failure ([place]) 06/26/2012   Chronic pain    COPD exacerbation ([place]) 06/26/2012   COPD with acute exacerbation ([place]) 03/01/2017   DEPRESSIVE DISORDER, NOS 03/13/2006   Qualifier: Diagnosis of  By: Damita Dunnings MD, Phillip Heal     Edema, peripheral    Essential hypertension 04/30/2014   Fall at home, initial encounter 03/01/2017   GASTROESOPHAGEAL REFLUX, NO ESOPHAGITIS 03/13/2006   Qualifier: Diagnosis of  By: Damita Dunnings MD, Burt Ek 03/13/2006   Qualifier: Diagnosis of  By: Damita Dunnings MD, Phillip Heal     Hyperlipidemia    HYPERTENSION, BENIGN SYSTEMIC 03/13/2006   Qualifier: Diagnosis of  By: Damita Dunnings MD, [place]     Hypokalemia 04/30/2014   Hyponatremia 04/30/2014   Hypotension 04/30/2014   Hypoxemia 06/26/2012   Irritable bowel syndrome 03/13/2006   Qualifier: Diagnosis of  By: Damita Dunnings MD, [place]     Lung nodule 04/16/2014   MENOPAUSAL SYNDROME 03/13/2006   Qualifier: Diagnosis of  By: Damita Dunnings MD, Phillip Heal     Mood disorder ([place]) 03/13/2006   Qualifier: Diagnosis of  By: Damita Dunnings MD, Phillip Heal     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[place])  Nausea 04/30/2014   Nausea vomiting and diarrhea 04/16/2014   OSTEOARTHRITIS, MULTI SITES 03/13/2006   Qualifier: Diagnosis of  By: Damita Dunnings MD, Phillip Heal     Pulmonary nodule 04/30/2014   Respiratory failure ([place]) 01/22/2017   TOBACCO DEPENDENCE 03/13/2006   Qualifier: Diagnosis of  By: Damita Dunnings MD, Phillip Heal     Weight loss     Past Surgical History:  Procedure Laterality Date   EYE SURGERY     ROTATOR CUFF REPAIR Right 2012     reports that she quit smoking  about a year ago. Her smoking use included cigarettes. She smoked 1.00 pack per day. She has never used smokeless tobacco. She reports that she does not drink alcohol or use drugs.  No Known Allergies  Family History  Problem Relation Age of Onset   CAD Father    Lung cancer Brother    Healthy Son    Healthy Daughter    Healthy Daughter      Prior to Admission medications   Medication Sig Start Date End Date Taking? Authorizing Provider  albuterol (PROVENTIL HFA;VENTOLIN HFA) 108 (90 BASE) MCG/ACT inhaler Inhale 2 puffs into the lungs every 6 (six) hours as needed for wheezing. 06/09/12   Monika Salk, MD  albuterol (PROVENTIL) (2.5 MG/3ML) 0.083% nebulizer solution Take 3 mLs (2.5 mg total) by nebulization every 2 (two) hours as needed for wheezing. 04/20/14   Rama, Venetia Maxon, MD  amLODipine (NORVASC) 10 MG tablet Take 1 tablet (10 mg total) by mouth daily. 05/02/14   Kelvin Cellar, MD  bisacodyl (DULCOLAX) 10 MG suppository Place 10 mg rectally as needed for moderate constipation.    [provider]  budesonide (PULMICORT) 0.25 MG/2ML nebulizer solution Take 2 mLs (0.25 mg total) by nebulization 2 (two) times daily. Patient not taking: Reported on 03/01/2017 04/20/14   Rama, Venetia Maxon, MD  feeding supplement, ENSURE ENLIVE, (ENSURE ENLIVE) LIQD Take 237 mLs by mouth 2 (two) times daily between meals. 03/03/17   Aline August, MD  FLUoxetine (PROZAC) 40 MG capsule Take 40 mg by mouth every morning.     [provider]  guaiFENesin (MUCINEX) 600 MG 12 hr tablet Take 1 tablet (600 mg total) by mouth 2 (two) times daily. 03/03/17   Aline August, MD  ipratropium (ATROVENT) 0.02 % nebulizer solution Take 2.5 mLs (0.5 mg total) by nebulization 4 (four) times daily. Patient not taking: Reported on 03/01/2017 04/20/14   Rama, Venetia Maxon, MD  methadone (DOLOPHINE) 1 MG/1ML solution Take 60 mg by mouth daily. [facility].     [provider]  potassium  chloride (K-DUR,KLOR-CON) 10 MEQ tablet Take 10 mEq by mouth daily. 02/11/17   [provider]  simvastatin (ZOCOR) 20 MG tablet Take 20 mg by mouth at bedtime.  02/16/14   [provider]    Physical Exam: Vitals:   03/26/18 0031 03/26/18 0047 03/26/18 0100 03/26/18 0113  BP: 96/65     Pulse: 70  81 73  Resp: 18  18 19   Temp:  (!) 97.3 F (36.3 C)    TempSrc:  Rectal    SpO2: 100%  100% 99%    Constitutional: Tachypneic, increased WOB, no pallor, no diaphoresis  Eyes: PERTLA, lids and conjunctivae normal ENMT: Mucous membranes are moist. Posterior pharynx clear of any exudate or lesions.   Neck: normal, supple, no masses, no thyromegaly Respiratory: Dyspneic with speech. Increased WOB. Diminished bilaterally with prolonged expiratory phase. No pallor or cyanosis. No wheezing.  Cardiovascular: S1 & S2 heard, regular rate and rhythm. Pretibial pitting edema bilaterally. Neck veins distended. Abdomen: No distension, no tenderness, soft. Bowel sounds normal.  Musculoskeletal: no clubbing / cyanosis. No joint deformity upper and lower extremities.    Skin: no significant rashes, lesions, ulcers. Warm, dry, well-perfused. Neurologic: No facial asymmetry. Sensation intact. Moving all extremities. Resting tremor to bilateral UE's.  Psychiatric: Alert and oriented to person, place, and situation. Calm, cooperative.    Labs on Admission: I have personally reviewed following labs and imaging studies  CBC: Recent Labs  Lab 03/25/18 2358  WBC 11.4*  HGB 11.3*  HCT 36.5  MCV 95.1  PLT 335   Basic Metabolic Panel: Recent Labs  Lab 03/25/18 2358  NA 133*  K 5.5*  CL 96*  CO2 27  GLUCOSE 159*  BUN 19  CREATININE 1.35*  CALCIUM 8.9   GFR: CrCl cannot be calculated (Unknown ideal weight.). Liver Function Tests: Recent Labs  Lab 03/25/18 2358  AST 30  ALT 11  ALKPHOS 110  BILITOT 0.6  PROT 6.2*  ALBUMIN 3.5   No results for input(s): LIPASE, AMYLASE in  the last 168 hours. No results for input(s): AMMONIA in the last 168 hours. Coagulation Profile: No results for input(s): INR, PROTIME in the last 168 hours. Cardiac Enzymes: No results for input(s): CKTOTAL, CKMB, CKMBINDEX, TROPONINI in the last 168 hours. BNP (last 3 results) No results for input(s): PROBNP in the last 8760 hours. HbA1C: No results for input(s): HGBA1C in the last 72 hours. CBG: No results for input(s): GLUCAP in the last 168 hours. Lipid Profile: No results for input(s): CHOL, HDL, LDLCALC, TRIG, CHOLHDL, LDLDIRECT in the last 72 hours. Thyroid Function Tests: No results for input(s): TSH, T4TOTAL, FREET4, T3FREE, THYROIDAB in the last 72 hours. Anemia Panel: No results for input(s): VITAMINB12, FOLATE, FERRITIN, TIBC, IRON, RETICCTPCT in the last 72 hours. Urine analysis:    Component Value Date/Time   COLORURINE YELLOW 01/22/2017 [place] 01/22/2017 1245   LABSPEC 1.014 01/22/2017 1245   PHURINE 5.0 01/22/2017 1245   GLUCOSEU NEGATIVE 01/22/2017 1245   HGBUR NEGATIVE 01/22/2017 1245   BILIRUBINUR NEGATIVE 01/22/2017 1245   KETONESUR NEGATIVE 01/22/2017 1245   PROTEINUR 30 (A) 01/22/2017 1245   UROBILINOGEN 0.2 04/30/2014 2050   NITRITE NEGATIVE 01/22/2017 1245   LEUKOCYTESUR NEGATIVE 01/22/2017 1245   Sepsis Labs: @LABRCNTIP (procalcitonin:4,lacticidven:4) )No results found for this or any previous visit (from the past 240 hour(s)).   Radiological Exams on Admission: Dg Chest 2 View  Result Date: 03/26/2018 CLINICAL DATA:  73 y/o F; chills, cough, nausea, vomiting for 2 days. EXAM: CHEST - 2 VIEW COMPARISON:  07/12/2017 chest radiograph. FINDINGS: Stable normal cardiac silhouette given projection and technique. Aortic atherosclerosis with calcification. Increased reticular opacities with basilar predominance. No focal consolidation. No pleural effusion or pneumothorax. No acute osseous abnormality is evident. IMPRESSION: Increased  reticular opacities with basilar predominance, possibly interstitial edema or bronchitic changes. No focal consolidation. Electronically Signed   By: Kristine Garbe M.D.   On: 03/26/2018 00:55    EKG: Independently reviewed. Sinus rhythm, LPFB.   Assessment/Plan   1. Acute on chronic systolic CHF   - Presents with acute respiratory distress, found to be hypoxic and hypercarbic with leg swelling, neck vein distension, newly elevated BNP, and CXR findings concerning for possible pulmonary edema  - She had EF 40-45% one year ago  - Diurese with Lasix 40 mg IV q12h, follow daily wt and I/O's,  update echo   2. COPD with acute exacerbation  - Presents with SOB, hypoxia, hypercarbia   - There is no wheezing in ED but she had been treated with nebs and steroids with EMS prior to arrival  - She has had increased productive cough recently and reports some improvement with albuterol, suggesting a component of COPD exacerbation  - Check sputum culture, continue systemic steroid, start azithromycin, continue ICS, LAMA, and albuterol nebs    3. Mild renal insufficiency  - SCr is 1.35 on admission, up from 0.9 in January  - Appeared hypovolemic and given a liter NS in ED  - Renally-dose medications, avoid nephrotoxins, repeat chem panel in am   4. Hyponatremia  - Serum sodium is 133 on admission in setting of apparent hypovolemia  - Treated in ED with 1 liter NS  - Repeat chem panel in am    5. Chronic pain   - Med rec pending, no pain complaints on admission     DVT prophylaxis: Lovenox  Code Status: Full  Family Communication: Discussed with patient  Consults called: None Admission status: Observation     Vianne Bulls, MD [facility] Pager [phone]  If 7PM-7AM, please contact night-coverage [email or web address] Password [email or web address]  03/26/2018, 1:24 AM

## 2018-03-26 NOTE — Progress Notes (Signed)
Late note: RT obtained ABG with the following results. Pt on [place] 3lpm. MD Opyd notified of pt critical PCO2 68.6 on ABG results. RT will continue to monitor. MD requested second ABG in one hour. If pt PCO2 has not improved significantly MD wants pt to go on BiPAP.  Results for Allison Vaughan, Allison Vaughan (MRN [id number]) as of 03/26/2018 04:52  Ref. Range 03/26/2018 01:27  Sample type Unknown ARTERIAL  pH, Arterial Latest Ref Range: 7.350 - 7.450  7.217 (L)  pCO2 arterial Latest Ref Range: 32.0 - 48.0 mmHg 68.6 (HH)  pO2, Arterial Latest Ref Range: 83.0 - 108.0 mmHg 159.0 (H)  TCO2 Latest Ref Range: 22 - 32 mmol/L 30  Acid-base deficit Latest Ref Range: 0.0 - 2.0 mmol/L 1.0  Bicarbonate Latest Ref Range: 20.0 - 28.0 mmol/L 28.1 (H)  O2 Saturation Latest Units: % 99.0  Patient temperature Unknown 97.3 F

## 2018-03-26 NOTE — Progress Notes (Signed)
PROGRESS NOTE    Allison Vaughan  KGU:[id number] DOB: 06/03/1945 DOA: 03/25/2018 PCP: Antonietta Jewel, MD    Brief Narrative:  Patient is 73 year old female with history of COPD, ongoing smoker, chronic systolic heart failure with known ejection fraction of 45%, hypertension, chronic pain who is admitted from emergency room early morning hours where she presented with acute respiratory distress.  Patient says she has been suffering from increasing cough and shortness of breath for about a week.  Denies any weight gain.  Denies any leg edema.  Patient was started on BiPAP because of respiratory distress and acidosis and admitted to progressive care unit.  Assessment & Plan:   Principal Problem:   Acute on chronic respiratory failure with hypoxia and hypercapnia (HCC) Active Problems:   Hyponatremia   Acute encephalopathy   COPD with acute exacerbation (HCC)   Acute on chronic systolic CHF (congestive heart failure) (HCC)   Mild renal insufficiency  Acute on chronic respiratory failure with hypoxia and hypercapnia:  Most likely from COPD exacerbation.  Aggressive bronchodilator therapy, IV steroids, inhalational steroids, scheduled and as needed bronchodilators, deep breathing exercises, incentive spirometry, chest physiotherapy and respiratory therapy consult. Antibiotics due to severity of symptoms.Patient has been started on azithromycin, will continue for 5 days to treat COPD exacerbation. Supplemental oxygen to keep saturations more than 92%. BiPAP as needed for respiratory distress.  Suspected acute on chronic systolic heart failure: Patient is euvolemic on clinical examination.  No clinical suspicion of acute heart failure exacerbation.  She is a started on IV diuresis.  Will change to a small dose of oral Lasix.  She is getting echocardiogram.  Will review.  Lactic acidosis: Due to work of breathing.  Her blood gas analysis shows acidosis with minimally elevated CO2.  Most  likely with metabolic acidosis.  Patient does not have any evidence of obvious sepsis or septic shock.  Will recheck to ensure stabilization.  Physical deconditioning: Patient complained of multiple falls at home.  She will work with PT OT while in the hospital.  DVT prophylaxis: Lovenox Code Status: Full code Family Communication: No family at bedside Disposition Plan: Home with home health care.   Consultants:   None  Procedures:   None  Antimicrobials:   Azithromycin 500 mg IV daily 3/12.   Subjective: Patient was seen and examined.  Her pH was low with slightly elevated CO2 overnight, she was in respiratory distress so remained on BiPAP.  On my interview she was very uncomfortable with BiPAP and took it off herself to talk to me.  She does not want to wear the BiPAP.  Patient was comfortable on 2 L of oxygen and was able to finish sentences. She was resuming she is going home today. Complaint of symptoms for 2 weeks.   Objective: Vitals:   03/26/18 0330 03/26/18 0841 03/26/18 1129 03/26/18 1200  BP: 105/60  (!) 141/85   Pulse:  80 86 87  Resp:  (!) 26 14 16   Temp: (!) 97.5 F (36.4 C)  98.2 F (36.8 C)   TempSrc: Oral  Oral   SpO2: 100% 100% 92% 95%    Intake/Output Summary (Last 24 hours) at 03/26/2018 1309 Last data filed at 03/26/2018 0700 Gross per 24 hour  Intake 250 ml  Output 400 ml  Net -150 ml   There were no vitals filed for this visit.  Examination:  General exam: Appears calm and comfortable  Anxious.  Not in any distress. Respiratory system: Clear to auscultation. Respiratory  effort normal.  Occasional expiratory wheezes. Cardiovascular system: S1 & S2 heard, RRR. No JVD, murmurs, rubs, gallops or clicks. No pedal edema. Gastrointestinal system: Abdomen is nondistended, soft and nontender. No organomegaly or masses felt. Normal bowel sounds heard. Central nervous system: Alert and oriented. No focal neurological deficits. Extremities:  Symmetric 5 x 5 power. Skin: No rashes, lesions or ulcers Psychiatry: Judgement and insight appear normal. Mood & affect appropriate.     Data Reviewed: I have personally reviewed following labs and imaging studies  CBC: Recent Labs  Lab 03/25/18 2358 03/26/18 0127 03/26/18 0256 03/26/18 0516  WBC 11.4*  --   --  6.6  NEUTROABS  --   --   --  6.3  HGB 11.3* 10.2* 10.5* 10.1*  HCT 36.5 30.0* 31.0* 32.0*  MCV 95.1  --   --  92.2  PLT 220  --   --  259   Basic Metabolic Panel: Recent Labs  Lab 03/25/18 2358 03/26/18 0127 03/26/18 0256 03/26/18 0516  NA 133* 132* 133* 132*  K 5.5* 4.9 4.5 4.8  CL 96*  --   --  96*  CO2 27  --   --  23  GLUCOSE 159*  --   --  157*  BUN 19  --   --  20  CREATININE 1.35*  --   --  1.27*  CALCIUM 8.9  --   --  8.8*   GFR: CrCl cannot be calculated (Unknown ideal weight.). Liver Function Tests: Recent Labs  Lab 03/25/18 2358  AST 30  ALT 11  ALKPHOS 110  BILITOT 0.6  PROT 6.2*  ALBUMIN 3.5   No results for input(s): LIPASE, AMYLASE in the last 168 hours. No results for input(s): AMMONIA in the last 168 hours. Coagulation Profile: No results for input(s): INR, PROTIME in the last 168 hours. Cardiac Enzymes: Recent Labs  Lab 03/25/18 2358 03/26/18 0516  TROPONINI <0.03 <0.03   BNP (last 3 results) No results for input(s): PROBNP in the last 8760 hours. HbA1C: No results for input(s): HGBA1C in the last 72 hours. CBG: Recent Labs  Lab 03/26/18 0836  GLUCAP 133*   Lipid Profile: No results for input(s): CHOL, HDL, LDLCALC, TRIG, CHOLHDL, LDLDIRECT in the last 72 hours. Thyroid Function Tests: No results for input(s): TSH, T4TOTAL, FREET4, T3FREE, THYROIDAB in the last 72 hours. Anemia Panel: No results for input(s): VITAMINB12, FOLATE, FERRITIN, TIBC, IRON, RETICCTPCT in the last 72 hours. Sepsis Labs: Recent Labs  Lab 03/26/18 0000 03/26/18 0516  LATICACIDVEN 3.8* 4.2*    No results found for this or any  previous visit (from the past 240 hour(s)).       Radiology Studies: Dg Chest 2 View  Result Date: 03/26/2018 CLINICAL DATA:  73 y/o F; chills, cough, nausea, vomiting for 2 days. EXAM: CHEST - 2 VIEW COMPARISON:  07/12/2017 chest radiograph. FINDINGS: Stable normal cardiac silhouette given projection and technique. Aortic atherosclerosis with calcification. Increased reticular opacities with basilar predominance. No focal consolidation. No pleural effusion or pneumothorax. No acute osseous abnormality is evident. IMPRESSION: Increased reticular opacities with basilar predominance, possibly interstitial edema or bronchitic changes. No focal consolidation. Electronically Signed   By: Kristine Garbe M.D.   On: 03/26/2018 00:55        Scheduled Meds: . budesonide  0.25 mg Nebulization BID  . enoxaparin (LOVENOX) injection  40 mg Subcutaneous Q24H  . furosemide  40 mg Intravenous Q12H  . guaiFENesin  600 mg Oral BID  .  ipratropium  0.5 mg Nebulization TID  . methylPREDNISolone (SOLU-MEDROL) injection  40 mg Intravenous Q6H  . sodium chloride flush  3 mL Intravenous Once  . sodium chloride flush  3 mL Intravenous Q12H  . sodium chloride flush  3 mL Intravenous Q12H   Continuous Infusions: . sodium chloride    . azithromycin 500 mg (03/26/18 0200)     LOS: 0 days    Time spent: 25 minutes    Barb Merino, MD [facility] Pager [phone]  If 7PM-7AM, please contact night-coverage [email or web address] Password [facility] 03/26/2018, 1:09 PM

## 2018-03-27 ENCOUNTER — Other Ambulatory Visit: Payer: Self-pay

## 2018-03-27 ENCOUNTER — Encounter (HOSPITAL_COMMUNITY): Payer: Self-pay | Admitting: General Practice

## 2018-03-27 DIAGNOSIS — I2510 Atherosclerotic heart disease of native coronary artery without angina pectoris: Secondary | ICD-10-CM | POA: Diagnosis present

## 2018-03-27 DIAGNOSIS — Z801 Family history of malignant neoplasm of trachea, bronchus and lung: Secondary | ICD-10-CM | POA: Diagnosis not present

## 2018-03-27 DIAGNOSIS — J9621 Acute and chronic respiratory failure with hypoxia: Secondary | ICD-10-CM | POA: Diagnosis present

## 2018-03-27 DIAGNOSIS — I110 Hypertensive heart disease with heart failure: Secondary | ICD-10-CM | POA: Diagnosis present

## 2018-03-27 DIAGNOSIS — Z87891 Personal history of nicotine dependence: Secondary | ICD-10-CM | POA: Diagnosis not present

## 2018-03-27 DIAGNOSIS — J9622 Acute and chronic respiratory failure with hypercapnia: Secondary | ICD-10-CM | POA: Diagnosis present

## 2018-03-27 DIAGNOSIS — E861 Hypovolemia: Secondary | ICD-10-CM | POA: Diagnosis present

## 2018-03-27 DIAGNOSIS — G8929 Other chronic pain: Secondary | ICD-10-CM | POA: Diagnosis present

## 2018-03-27 DIAGNOSIS — F1190 Opioid use, unspecified, uncomplicated: Secondary | ICD-10-CM | POA: Diagnosis present

## 2018-03-27 DIAGNOSIS — J441 Chronic obstructive pulmonary disease with (acute) exacerbation: Secondary | ICD-10-CM | POA: Diagnosis present

## 2018-03-27 DIAGNOSIS — E871 Hypo-osmolality and hyponatremia: Secondary | ICD-10-CM | POA: Diagnosis present

## 2018-03-27 DIAGNOSIS — I4891 Unspecified atrial fibrillation: Secondary | ICD-10-CM | POA: Diagnosis present

## 2018-03-27 DIAGNOSIS — E872 Acidosis: Secondary | ICD-10-CM | POA: Diagnosis present

## 2018-03-27 DIAGNOSIS — Z8249 Family history of ischemic heart disease and other diseases of the circulatory system: Secondary | ICD-10-CM | POA: Diagnosis not present

## 2018-03-27 DIAGNOSIS — N289 Disorder of kidney and ureter, unspecified: Secondary | ICD-10-CM | POA: Diagnosis present

## 2018-03-27 DIAGNOSIS — K219 Gastro-esophageal reflux disease without esophagitis: Secondary | ICD-10-CM | POA: Diagnosis present

## 2018-03-27 DIAGNOSIS — R296 Repeated falls: Secondary | ICD-10-CM | POA: Diagnosis present

## 2018-03-27 DIAGNOSIS — Z79899 Other long term (current) drug therapy: Secondary | ICD-10-CM | POA: Diagnosis not present

## 2018-03-27 DIAGNOSIS — Z7951 Long term (current) use of inhaled steroids: Secondary | ICD-10-CM | POA: Diagnosis not present

## 2018-03-27 DIAGNOSIS — E785 Hyperlipidemia, unspecified: Secondary | ICD-10-CM | POA: Diagnosis present

## 2018-03-27 DIAGNOSIS — I5023 Acute on chronic systolic (congestive) heart failure: Secondary | ICD-10-CM | POA: Diagnosis present

## 2018-03-27 DIAGNOSIS — R0602 Shortness of breath: Secondary | ICD-10-CM | POA: Diagnosis present

## 2018-03-27 DIAGNOSIS — G9340 Encephalopathy, unspecified: Secondary | ICD-10-CM | POA: Diagnosis present

## 2018-03-27 LAB — GLUCOSE, CAPILLARY: Glucose-Capillary: 127 mg/dL — ABNORMAL HIGH (ref 70–99)

## 2018-03-27 LAB — LACTIC ACID, PLASMA: Lactic Acid, Venous: 1 mmol/L (ref 0.5–1.9)

## 2018-03-27 MED ORDER — ENOXAPARIN SODIUM 40 MG/0.4ML ~~LOC~~ SOLN
40.0000 mg | Freq: Two times a day (BID) | SUBCUTANEOUS | Status: DC
  Administered 2018-03-27 – 2018-03-28 (×3): 40 mg via SUBCUTANEOUS
  Filled 2018-03-27 (×3): qty 0.4

## 2018-03-27 MED ORDER — OXYCODONE HCL 5 MG PO TABS: 10.0000 mg | ORAL_TABLET | Freq: Two times a day (BID) | ORAL | Status: DC | PRN

## 2018-03-27 MED ORDER — ATORVASTATIN CALCIUM 10 MG PO TABS
10.0000 mg | ORAL_TABLET | Freq: Every day | ORAL | Status: DC
  Administered 2018-03-27: 10 mg via ORAL
  Filled 2018-03-27: qty 1

## 2018-03-27 MED ORDER — SIMVASTATIN 20 MG PO TABS: 20.0000 mg | ORAL_TABLET | Freq: Every day | ORAL | Status: DC

## 2018-03-27 MED ORDER — LORAZEPAM 0.5 MG PO TABS
0.5000 mg | ORAL_TABLET | Freq: Three times a day (TID) | ORAL | Status: DC | PRN
  Administered 2018-03-27: 0.5 mg via ORAL
  Filled 2018-03-27 (×2): qty 1

## 2018-03-27 MED ORDER — ACETAMINOPHEN 325 MG PO TABS
650.0000 mg | ORAL_TABLET | Freq: Four times a day (QID) | ORAL | Status: DC | PRN
  Administered 2018-03-28: 650 mg via ORAL
  Filled 2018-03-27 (×2): qty 2

## 2018-03-27 MED ORDER — SODIUM CHLORIDE 0.9 % IV SOLN
500.0000 mg | INTRAVENOUS | Status: DC
  Administered 2018-03-28: 500 mg via INTRAVENOUS
  Filled 2018-03-27: qty 500

## 2018-03-27 MED ORDER — DILTIAZEM HCL 30 MG PO TABS: 30.0000 mg | ORAL_TABLET | Freq: Four times a day (QID) | ORAL | Status: DC

## 2018-03-27 MED ORDER — ALUM & MAG HYDROXIDE-SIMETH 200-200-20 MG/5ML PO SUSP
15.0000 mL | Freq: Four times a day (QID) | ORAL | Status: DC | PRN
  Administered 2018-03-27: 15 mL via ORAL
  Filled 2018-03-27: qty 30

## 2018-03-27 MED ORDER — DILTIAZEM HCL 60 MG PO TABS
60.0000 mg | ORAL_TABLET | Freq: Four times a day (QID) | ORAL | Status: DC
  Administered 2018-03-27 – 2018-03-28 (×5): 60 mg via ORAL
  Filled 2018-03-27 (×5): qty 1

## 2018-03-27 MED ORDER — UMECLIDINIUM BROMIDE 62.5 MCG/INH IN AEPB
1.0000 | INHALATION_SPRAY | Freq: Every day | RESPIRATORY_TRACT | Status: DC
  Administered 2018-03-28: 1 via RESPIRATORY_TRACT
  Filled 2018-03-27: qty 7

## 2018-03-27 MED ORDER — METHYLPREDNISOLONE SODIUM SUCC 40 MG IJ SOLR
40.0000 mg | Freq: Two times a day (BID) | INTRAMUSCULAR | Status: AC
  Administered 2018-03-27: 40 mg via INTRAVENOUS
  Filled 2018-03-27: qty 1

## 2018-03-27 MED ORDER — METHYLPREDNISOLONE SODIUM SUCC 40 MG IJ SOLR: 40.0000 mg | Freq: Two times a day (BID) | INTRAMUSCULAR | Status: DC

## 2018-03-27 MED ORDER — MOMETASONE FURO-FORMOTEROL FUM 200-5 MCG/ACT IN AERO
2.0000 | INHALATION_SPRAY | Freq: Two times a day (BID) | RESPIRATORY_TRACT | Status: DC
  Administered 2018-03-27 – 2018-03-28 (×2): 2 via RESPIRATORY_TRACT
  Filled 2018-03-27: qty 8.8

## 2018-03-27 MED ORDER — PREDNISONE 20 MG PO TABS
20.0000 mg | ORAL_TABLET | Freq: Two times a day (BID) | ORAL | Status: DC
  Administered 2018-03-28: 20 mg via ORAL
  Filled 2018-03-27: qty 1

## 2018-03-27 MED ORDER — HYDROCODONE-ACETAMINOPHEN 5-325 MG PO TABS
1.0000 | ORAL_TABLET | ORAL | Status: DC | PRN
  Administered 2018-03-27 (×2): 1 via ORAL
  Administered 2018-03-28: 2 via ORAL
  Filled 2018-03-27: qty 1
  Filled 2018-03-27: qty 2
  Filled 2018-03-27: qty 1

## 2018-03-27 NOTE — Evaluation (Signed)
Physical Therapy Evaluation Patient Details Name: Allison Vaughan MRN: [id number] DOB: February 05, 1945 Today's Date: 03/27/2018   History of Present Illness  Patient is 73 year old female with history of COPD, ongoing smoker, chronic systolic heart failure with known ejection fraction of 45%, hypertension, chronic pain who is admitted from emergency room early morning hours where she presented with acute respiratory distress.     Clinical Impression  Pt admitted with above diagnosis. Pt currently with functional limitations due to the deficits listed below (see PT Problem List). PTA pt lived at home with her daughter. She was independent with mobility without AD. Daughter assisted with ADLs. On eval, pt required min guard assist transfers and ambulation 150 feet without AD. SpO2 92% during ambulation on RA. SpO2 improved to 100% after 2-minute seated rest in recliner. Max HR 127. Resting HR 115 at end of session. Pt will benefit from skilled PT to increase their independence and safety with mobility to allow discharge to the venue listed below.       Follow Up Recommendations Home health PT;Supervision/Assistance - 24 hour    Equipment Recommendations  None recommended by PT    Recommendations for Other Services       Precautions / Restrictions Precautions Precautions: Fall      Mobility  Bed Mobility Overal bed mobility: Modified Independent                Transfers Overall transfer level: Needs assistance Equipment used: None Transfers: Sit to/from Stand;Stand Pivot Transfers Sit to Stand: Min guard Stand pivot transfers: Min guard       General transfer comment: min guard for safety  Ambulation/Gait Ambulation/Gait assistance: Min guard Gait Distance (Feet): 150 Feet Assistive device: None Gait Pattern/deviations: Step-through pattern;Decreased stride length Gait velocity: mildly decreased Gait velocity interpretation: 1.31 - 2.62 ft/sec, indicative of limited  community ambulator General Gait Details: pt ambulated on RA with SpO2 92%. 100% on RA after 2-minute seated rest in recliner.   Stairs            Wheelchair Mobility    Modified Rankin (Stroke Patients Only)       Balance Overall balance assessment: Mild deficits observed, not formally tested                                           Pertinent Vitals/Pain Pain Assessment: No/denies pain    Home Living Family/patient expects to be discharged to:: Private residence Living Arrangements: Children Available Help at Discharge: Family;Available 24 hours/day Type of Home: House Home Access: Level entry     Home Layout: One level Home Equipment: Grab bars - toilet;Grab bars - tub/shower;Shower seat;Walker - 2 wheels      Prior Function Level of Independence: Needs assistance   Gait / Transfers Assistance Needed: pt reports she was ambulatory without AD  ADL's / Homemaking Assistance Needed: pt required assistance from her daughter for all ADL  Comments: pt reports previous falls     Hand Dominance   Dominant Hand: Right    Extremity/Trunk Assessment   Upper Extremity Assessment Upper Extremity Assessment: Defer to OT evaluation    Lower Extremity Assessment Lower Extremity Assessment: Generalized weakness    Cervical / Trunk Assessment Cervical / Trunk Assessment: Normal  Communication   Communication: No difficulties  Cognition Arousal/Alertness: Awake/alert Behavior During Therapy: WFL for tasks assessed/performed Overall Cognitive Status: No family/caregiver present to  determine baseline cognitive functioning Area of Impairment: Safety/judgement;Awareness                         Safety/Judgement: Decreased awareness of safety;Decreased awareness of deficits Awareness: Emergent          General Comments General comments (skin integrity, edema, etc.): Max HR 127. Resting HR 115 at end of session.     Exercises      Assessment/Plan    PT Assessment Patient needs continued PT services  PT Problem List Decreased strength;Decreased balance;Decreased knowledge of precautions;Decreased mobility;Cardiopulmonary status limiting activity;Decreased activity tolerance       PT Treatment Interventions Functional mobility training;Balance training;Patient/family education;Gait training;Therapeutic activities;Therapeutic exercise    PT Goals (Current goals can be found in the Care Plan section)  Acute Rehab PT Goals Patient Stated Goal: to go home PT Goal Formulation: With patient Time For Goal Achievement: 04/10/18 Potential to Achieve Goals: Good    Frequency Min 3X/week   Barriers to discharge        Co-evaluation               AM-PAC PT "6 Clicks" Mobility  Outcome Measure Help needed turning from your back to your side while in a flat bed without using bedrails?: None Help needed moving from lying on your back to sitting on the side of a flat bed without using bedrails?: A Little Help needed moving to and from a bed to a chair (including a wheelchair)?: A Little Help needed standing up from a chair using your arms (e.g., wheelchair or bedside chair)?: A Little Help needed to walk in hospital room?: A Little Help needed climbing 3-5 steps with a railing? : A Little 6 Click Score: 19    End of Session Equipment Utilized During Treatment: Gait belt Activity Tolerance: Patient tolerated treatment well Patient left: in chair;with call bell/phone within reach Nurse Communication: Mobility status PT Visit Diagnosis: Muscle weakness (generalized) (M62.81);History of falling (Z91.81)    Time: 7062-3762 PT Time Calculation (min) (ACUTE ONLY): 15 min   Charges:   PT Evaluation $PT Eval Moderate Complexity: 1 Mod          Lorrin Goodell, PT  Office # [phone] Pager [phone]   Lorriane Shire 03/27/2018, 11:24 AM

## 2018-03-27 NOTE — Progress Notes (Signed)
PHARMACIST - PHYSICIAN ORDER COMMUNICATION  CONCERNING: P&T Medication Policy on Statin Medications Pharmacy screens all simvastatin (Zocor) orders for drug interactions. If there is an interaction, substitute atorvastatin 1 mg for each 2 mg simvastatin & leave communication for MD. The dose of simvastatin should not exceed 10 mg/day in patients receiving the following medications: diltiazem    ACTION TAKEN: Clinically equivalent dose of Lipitor 10 mg daily  has been substituted for Zocor 20mg  daily per [facility] P&T committee policy.  Nicole Cella, RPh Clinical Pharmacist [phone] Please check AMION for all [place] phone numbers After 10:00 PM, call [place] [phone] 03/27/2018 10:11 AM

## 2018-03-27 NOTE — TOC Benefit Eligibility Note (Signed)
Transition of Care [facility]) Benefit Eligibility Note    Patient Details  Name: Allison Vaughan MRN: [id number] Date of Birth: 10-27-1945   Medication/Dose: Eliquis 10mg . and 2.5 mg  Covered?: Yes  Tier: 3 Drug  Prescription Coverage Preferred Pharmacy: [facility]  Spoke with Person/Company/Phone Number:: Daneen Schick [employer] [phone]  Co-Pay: $45.00 per 30 day supply  Prior Approval: No  Deductible: Unmet       Shelda Altes Phone Number: 03/27/2018, 2:22 PM

## 2018-03-27 NOTE — Progress Notes (Signed)
Daily Shift Note  Received sign out from [place].   Introduced self to [place] who endorsed she was in pain "all over" and that this was chronic condition. Provided norco which relieved pain.   Discussed diagnosis of Afib and educated on diltiazem and lovenox in the setting of this.   Discussed smoking cessation in the setting of COPD (Stage unknown) though patient endorsed she did not know if she was yet ready to quit.   Lasix stopped d/t lack of volume overload on examination per MD note.   All needs met during shift.

## 2018-03-27 NOTE — Progress Notes (Signed)
Allison Vaughan  Allison Vaughan  OZD:[id number] DOB: [birth year] DOA: 03/25/2018 PCP: Antonietta Jewel, MD     Brief Narrative:  73yo w/ a history of COPD, ongoing smoker, chronic systolic CHF (EF 25%), HTN, and chronic pain who presented with acute respiratory distress after increasing cough and shortness of breath for about a week.    Significant Events: 3/12 admit  3/12 TTE - EF 60-65% - ?DD  Subjective: The patient does not currently have oxygen support at home.  She is presently requiring 2 to 3 L nasal cannula during the day.  She denies chest pain.  She does feel that her heart is palpating at times.  She denies current shortness of breath.  She has not yet been up and ambulating to any significant extent.  She is anxious to go home but I have encouraged her to allow [place] to finish her treatment/work-up so as to assure that her discharge is appropriate and will help prevent rapid recurrence of her symptoms.  Assessment & Plan:  Acute on chronic hypoxic and hypercarbic arespiratory failure - acute COPD exacerbation  Slowly improving - attempt to wean to room air - watch saturations w/ ambulation   ?Parox Atrial Fibrillation v/s MAT Noted during admit Feb 2019 - was evaluated by Cards at that time - check EKG - rate marginal - add CCB - keep on tele    HTN BP variable - adjust tx and follow  HLD Cont Zocor   Prior hx of Systolic CHF EF has recovered per TTE this admit - no overload on exam   Lactic acidosis Due to increased work of breathing - no clinical evidence of septic shock  Physical deconditioning complained of multiple falls at home - PT/OT evals   Chronic methadone use  On hold at present in setting of hypoxic resp failure     DVT prophylaxis: lovenox  Code Status: FULL CODE Family Communication: no family present at time of exam  Disposition Plan: ambulate - wean O2 - watch rhythm and HR   Consultants:  none  Antimicrobials:   Azithromycin    Objective: Blood pressure (!) 152/87, pulse (!) 109, temperature 98 F (36.7 C), temperature source Axillary, resp. rate 17, SpO2 94 %.  Intake/Output Summary (Last 24 hours) at 03/27/2018 0838 Last data filed at 03/27/2018 0300 Gross per 24 hour  Intake 250 ml  Output -  Net 250 ml   There were no vitals filed for this visit.  Examination: General: No acute respiratory distress at rest  Lungs: distant breath sounds - no wheezing  Cardiovascular: frequent ectopic beats - no M or rub  Abdomen: Nontender, nondistended, soft, bowel sounds positive, no rebound, no ascites, no appreciable mass Extremities: No significant cyanosis, clubbing, or edema bilateral lower extremities  CBC: Recent Labs  Lab 03/25/18 2358  03/26/18 0256 03/26/18 0516 03/26/18 1347  WBC 11.4*  --   --  6.6 7.3  NEUTROABS  --   --   --  6.3 6.8  HGB 11.3*   < > 10.5* 10.Vaughan* 10.Vaughan*  HCT 36.5   < > 31.0* 32.0* 30.3*  MCV 95.Vaughan  --   --  92.2 89.4  PLT 220  --   --  186 167   < > = values in this interval not displayed.   Basic Metabolic Panel: Recent Labs  Lab 03/25/18 2358  03/26/18 0256 03/26/18 0516 03/26/18 1347  NA 133*   < > 133* 132* 130*  K 5.5*   < > 4.5 4.8 4.7  CL 96*  --   --  96* 93*  CO2 27  --   --  23 28  GLUCOSE 159*  --   --  157* 132*  BUN 19  --   --  20 19  CREATININE Vaughan.35*  --   --  Vaughan.27* Vaughan.08*  CALCIUM 8.9  --   --  8.8* 8.7*   < > = values in this interval not displayed.   GFR: CrCl cannot be calculated (Unknown ideal weight.).  Liver Function Tests: Recent Labs  Lab 03/25/18 2358  AST 30  ALT 11  ALKPHOS 110  BILITOT 0.6  PROT 6.2*  ALBUMIN 3.5    Cardiac Enzymes: Recent Labs  Lab 03/25/18 2358 03/26/18 0516  TROPONINI <0.03 <0.03    HbA1C: Hgb A1c MFr Bld  Date/Time Value Ref Range Status  03/02/2017 11:52 PM 5.0 4.8 - 5.6 % Final    Comment:    (NOTE) Pre diabetes:          5.7%-6.4% Diabetes:              >6.4% Glycemic control  for   <7.0% adults with diabetes     CBG: Recent Labs  Lab 03/26/18 0836 03/27/18 0801  GLUCAP 133* 127*    Scheduled Meds: . budesonide  0.25 mg Nebulization BID  . enoxaparin (LOVENOX) injection  40 mg Subcutaneous Q24H  . furosemide  40 mg Oral Daily  . guaiFENesin  600 mg Oral BID  . ipratropium  0.5 mg Nebulization TID  . methylPREDNISolone (SOLU-MEDROL) injection  40 mg Intravenous Q6H  . sodium chloride flush  3 mL Intravenous Once  . sodium chloride flush  3 mL Intravenous Q12H  . sodium chloride flush  3 mL Intravenous Q12H   Continuous Infusions: . sodium chloride    . azithromycin 500 mg (03/27/18 0201)     LOS: 0 days   Cherene Altes, MD [facility] Office  [phone] Pager - Text Page per Amion  If 7PM-7AM, please contact night-coverage per Amion 03/27/2018, 8:38 AM

## 2018-03-27 NOTE — Progress Notes (Signed)
ANTICOAGULATION CONSULT NOTE - Initial Consult  Pharmacy Consult for Lovenox Indication: atrial fibrillation  No Known Allergies  Patient Measurements: Height: 4\' 11"  (149.9 cm) Weight: 93 lb 7.6 oz (42.4 kg) IBW/kg (Calculated) : 43.2   Vital Signs: Temp: 98 F (36.7 C) (03/13 0759) Temp Source: Axillary (03/13 0759) BP: 152/87 (03/13 0759) Pulse Rate: 117 (03/13 0911)  Labs: Recent Labs    03/25/18 2358  03/26/18 0256 03/26/18 0516 03/26/18 1347  HGB 11.3*   < > 10.5* 10.1* 10.1*  HCT 36.5   < > 31.0* 32.0* 30.3*  PLT 220  --   --  186 167  CREATININE 1.35*  --   --  1.27* 1.08*  TROPONINI <0.03  --   --  <0.03  --    < > = values in this interval not displayed.    Estimated Creatinine Clearance: 30.6 mL/min (A) (by C-G formula based on SCr of 1.08 mg/dL (H)).   Medical History: Past Medical History:  Diagnosis Date  . Accidental drug overdose   . Acute encephalopathy   . Acute on chronic respiratory failure with hypoxia ([place]) 03/01/2017  . Acute renal failure ([place]) 04/16/2014  . AKI (acute kidney injury) ([place]) 04/30/2014  . Altered mental status 06/27/2012  . Anxiety   . Atrial fibrillation ([place])   . CAD (coronary artery disease)   . CAP (community acquired pneumonia) 04/18/2014  . Cardiomyopathy ([place])   . Chronic hypoxemic respiratory failure ([place]) 06/26/2012  . Chronic pain   . COPD exacerbation ([place]) 06/26/2012  . COPD with acute exacerbation ([place]) 03/01/2017  . DEPRESSIVE DISORDER, NOS 03/13/2006   Qualifier: Diagnosis of  By: Damita Dunnings MD, Phillip Heal    . Edema, peripheral   . Essential hypertension 04/30/2014  . Fall at home, initial encounter 03/01/2017  . GASTROESOPHAGEAL REFLUX, NO ESOPHAGITIS 03/13/2006   Qualifier: Diagnosis of  By: Damita Dunnings MD, Phillip Heal    . HYPERCHOLESTEROLEMIA 03/13/2006   Qualifier: Diagnosis of  By: Damita Dunnings MD, Phillip Heal    . Hyperlipidemia   . HYPERTENSION, BENIGN SYSTEMIC 03/13/2006   Qualifier: Diagnosis of  By: Damita Dunnings MD, Phillip Heal    .  Hypokalemia 04/30/2014  . Hyponatremia 04/30/2014  . Hypotension 04/30/2014  . Hypoxemia 06/26/2012  . Hypoxia 03/26/2018  . Irritable bowel syndrome 03/13/2006   Qualifier: Diagnosis of  By: Damita Dunnings MD, Phillip Heal    . Lung nodule 04/16/2014  . MENOPAUSAL SYNDROME 03/13/2006   Qualifier: Diagnosis of  By: Damita Dunnings MD, Phillip Heal    . Mood disorder [facility]) 03/13/2006   Qualifier: Diagnosis of  By: Damita Dunnings MD, Phillip Heal    . 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 Nausea 04/30/2014  . Nausea vomiting and diarrhea 04/16/2014  . OSTEOARTHRITIS, MULTI SITES 03/13/2006   Qualifier: Diagnosis of  By: Damita Dunnings MD, Phillip Heal    . Pulmonary nodule 04/30/2014  . Respiratory failure ([place]) 01/22/2017  . TOBACCO DEPENDENCE 03/13/2006   Qualifier: Diagnosis of  By: Damita Dunnings MD, Phillip Heal    . Weight loss     Medications:  Medications Prior to Admission  Medication Sig Dispense Refill Last Dose  . albuterol (PROVENTIL HFA;VENTOLIN HFA) 108 (90 BASE) MCG/ACT inhaler Inhale 2 puffs into the lungs every 6 (six) hours as needed for wheezing. 1 Inhaler 2 Past Month at Unknown time  . albuterol (PROVENTIL) (2.5 MG/3ML) 0.083% nebulizer solution Take 3 mLs (2.5 mg total) by nebulization every 2 (two) hours as needed for wheezing. 75 mL 12 unk  . amLODipine (NORVASC) 10 MG tablet Take 1  tablet (10 mg total) by mouth daily. 30 tablet 1 03/25/2018 at Unknown time  . bisacodyl (DULCOLAX) 10 MG suppository Place 10 mg rectally as needed for moderate constipation.   Past Month at Unknown time  . budesonide (PULMICORT) 0.25 MG/2ML nebulizer solution Take 2 mLs (0.25 mg total) by nebulization 2 (two) times daily. 60 mL 12 unk  . feeding supplement, ENSURE ENLIVE, (ENSURE ENLIVE) LIQD Take 237 mLs by mouth 2 (two) times daily between meals. 237 mL 12 Past Month at Unknown time  . FLUoxetine (PROZAC) 40 MG capsule Take 40 mg by mouth every morning.    03/25/2018 at Unknown time  . guaiFENesin (MUCINEX) 600 MG 12 hr tablet Take 1 tablet (600 mg total) by mouth  2 (two) times daily. 20 tablet 0 Past Month at Unknown time  . ipratropium (ATROVENT) 0.02 % nebulizer solution Take 2.5 mLs (0.5 mg total) by nebulization 4 (four) times daily. 75 mL 12 unk  . methadone (DOLOPHINE) 1 MG/1ML solution Take 60 mg by mouth daily. [facility].    Past Month at Unknown time  . Oxycodone HCl 10 MG TABS Take 10 mg by mouth 2 (two) times daily as needed for pain.   03/25/2018 at Unknown time  . potassium chloride (K-DUR,KLOR-CON) 10 MEQ tablet Take 10 mEq by mouth daily.   03/25/2018 at Unknown time  . simvastatin (ZOCOR) 20 MG tablet Take 20 mg by mouth at bedtime.   11 03/25/2018 at Unknown time    Assessment: 73 y.o female  Pharmacy consulted 03/27/18 to dose  Lovenox sq for Atrial fibrillation.  She received lovenox for VTE prophylaxis last night, 40mg  q24h  dose given 3/12 @ 15:22 Her weight is 42.4 kg, confirmed with RN.  SCr =1.08 , estimated CrCl 30.6 ml/min Hgb low/stable at 10.1 and PLTC is wnl although has decreased from admit.  Lovenox dosing for Afib is 1 mg/kg q12h for CrCl =/>30 ml/min.   Goal of Therapy:  Monitor platelets by anticoagulation protocol: Yes   Plan:  Lovenox 40 mg SQ q12h  Monitor renal function and obtain CBC q72 hours Monitor for s/sx of bleeding  Thank you for allowing pharmacy to be part of this patients care team. Nicole Cella, [place] Pharmacist [phone] Please check AMION for all [place] phone numbers After 10:00 PM, call [place] [phone] 03/27/2018,12:16 PM

## 2018-03-28 LAB — CBC
HCT: 32.4 % — ABNORMAL LOW (ref 36.0–46.0)
Hemoglobin: 10.8 g/dL — ABNORMAL LOW (ref 12.0–15.0)
MCH: 29.4 pg (ref 26.0–34.0)
MCHC: 33.3 g/dL (ref 30.0–36.0)
MCV: 88.3 fL (ref 80.0–100.0)
Platelets: 206 10*3/uL (ref 150–400)
RBC: 3.67 MIL/uL — AB (ref 3.87–5.11)
RDW: 12.7 % (ref 11.5–15.5)
WBC: 10.6 10*3/uL — ABNORMAL HIGH (ref 4.0–10.5)
nRBC: 0 % (ref 0.0–0.2)

## 2018-03-28 LAB — GLUCOSE, CAPILLARY: GLUCOSE-CAPILLARY: 163 mg/dL — AB (ref 70–99)

## 2018-03-28 LAB — BASIC METABOLIC PANEL
Anion gap: 7 (ref 5–15)
BUN: 21 mg/dL (ref 8–23)
CHLORIDE: 95 mmol/L — AB (ref 98–111)
CO2: 29 mmol/L (ref 22–32)
Calcium: 8.5 mg/dL — ABNORMAL LOW (ref 8.9–10.3)
Creatinine, Ser: 0.93 mg/dL (ref 0.44–1.00)
GFR calc Af Amer: >60 mL/min (ref >60)
Glucose, Bld: 130 mg/dL — ABNORMAL HIGH (ref 70–99)
POTASSIUM: 3.7 mmol/L (ref 3.5–5.1)
Sodium: 131 mmol/L — ABNORMAL LOW (ref 135–145)

## 2018-03-28 LAB — TSH: TSH: 0.825 u[IU]/mL (ref 0.350–4.500)

## 2018-03-28 LAB — MAGNESIUM: Magnesium: 2 mg/dL (ref 1.7–2.4)

## 2018-03-28 MED ORDER — UMECLIDINIUM BROMIDE 62.5 MCG/INH IN AEPB: 1.0000 | INHALATION_SPRAY | Freq: Every day | RESPIRATORY_TRACT | 0 refills | Status: DC

## 2018-03-28 MED ORDER — DILTIAZEM HCL ER COATED BEADS 300 MG PO CP24: 300.0000 mg | ORAL_CAPSULE | Freq: Every day | ORAL | 11 refills | Status: DC

## 2018-03-28 MED ORDER — AZITHROMYCIN 500 MG PO TABS: 500.0000 mg | ORAL_TABLET | Freq: Every day | ORAL | 0 refills | Status: AC

## 2018-03-28 MED ORDER — PREDNISONE 10 MG PO TABS: ORAL_TABLET | ORAL | 0 refills | Status: DC

## 2018-03-28 MED ORDER — ATORVASTATIN CALCIUM 10 MG PO TABS: 10.0000 mg | ORAL_TABLET | Freq: Every day | ORAL | 0 refills | Status: DC

## 2018-03-28 MED ORDER — APIXABAN 5 MG PO TABS: 5.0000 mg | ORAL_TABLET | Freq: Two times a day (BID) | ORAL | 0 refills | Status: DC

## 2018-03-28 MED ORDER — MOMETASONE FURO-FORMOTEROL FUM 200-5 MCG/ACT IN AERO: 2.0000 | INHALATION_SPRAY | Freq: Two times a day (BID) | RESPIRATORY_TRACT | 0 refills | Status: DC

## 2018-03-28 NOTE — Discharge Instructions (Signed)
YOU HAVE NOT BEEN TAKING METHADONE DURING YOUR ADMISSION. YOU SHOULD CONTINUE TO REFRAIN FROM TAKING METHADONE UNTIL YOU ARE SEEN BY YOUR PAIN MANAGEMENT DOCTOR AND GIVEN INSTRUCTIONS ON WHEN AND HOW TO START BACK. SIMPLY RESUMING YOUR PRIOR HOME DOSE COULD BE DANGEROUS.    Apixaban oral tablets What is this medicine? APIXABAN (a PIX a ban) is an anticoagulant (blood thinner). It is used to lower the chance of stroke in people with a medical condition called atrial fibrillation. It is also used to treat or prevent blood clots in the lungs or in the veins. This medicine may be used for other purposes; ask your health care provider or pharmacist if you have questions. COMMON BRAND NAME(S): Eliquis What should I tell my health care provider before I take this medicine? They need to know if you have any of these conditions: -bleeding disorders -bleeding in the brain -blood in your stools (black or tarry stools) or if you have blood in your vomit -history of stomach bleeding -kidney disease -liver disease -mechanical heart valve -an unusual or allergic reaction to apixaban, other medicines, foods, dyes, or preservatives -pregnant or trying to get pregnant -breast-feeding How should I use this medicine? Take this medicine by mouth with a glass of water. Follow the directions on the prescription label. You can take it with or without food. If it upsets your stomach, take it with food. Take your medicine at regular intervals. Do not take it more often than directed. Do not stop taking except on your doctor's advice. Stopping this medicine may increase your risk of a blood clot. Be sure to refill your prescription before you run out of medicine. Talk to your pediatrician regarding the use of this medicine in children. Special care may be needed. Overdosage: If you think you have taken too much of this medicine contact a poison control center or emergency room at once. NOTE: This medicine is only for  you. Do not share this medicine with others. What if I miss a dose? If you miss a dose, take it as soon as you can. If it is almost time for your next dose, take only that dose. Do not take double or extra doses. What may interact with this medicine? This medicine may interact with the following: -aspirin and aspirin-like medicines -certain medicines for fungal infections like ketoconazole and itraconazole -certain medicines for seizures like carbamazepine and phenytoin -certain medicines that treat or prevent blood clots like warfarin, enoxaparin, and dalteparin -clarithromycin -NSAIDs, medicines for pain and inflammation, like ibuprofen or naproxen -rifampin -ritonavir -St. John's wort This list may not describe all possible interactions. Give your health care provider a list of all the medicines, herbs, non-prescription drugs, or dietary supplements you use. Also tell them if you smoke, drink alcohol, or use illegal drugs. Some items may interact with your medicine. What should I watch for while using this medicine? Visit your healthcare professional for regular checks on your progress. You may need blood work done while you are taking this medicine. Your condition will be monitored carefully while you are receiving this medicine. It is important not to miss any appointments. Avoid sports and activities that might cause injury while you are using this medicine. Severe falls or injuries can cause unseen bleeding. Be careful when using sharp tools or knives. Consider using an Copy. Take special care brushing or flossing your teeth. Report any injuries, bruising, or red spots on the skin to your healthcare professional. If you are going to  need surgery or other procedure, tell your healthcare professional that you are taking this medicine. Wear a medical ID bracelet or chain. Carry a card that describes your disease and details of your medicine and dosage times. What side effects may I  notice from receiving this medicine? Side effects that you should report to your doctor or health care professional as soon as possible: -allergic reactions like skin rash, itching or hives, swelling of the face, lips, or tongue -signs and symptoms of bleeding such as bloody or black, tarry stools; red or dark-brown urine; spitting up blood or brown material that looks like coffee grounds; red spots on the skin; unusual bruising or bleeding from the eye, gums, or nose -signs and symptoms of a blood clot such as chest pain; shortness of breath; pain, swelling, or warmth in the leg -signs and symptoms of a stroke such as changes in vision; confusion; trouble speaking or understanding; severe headaches; sudden numbness or weakness of the face, arm or leg; trouble walking; dizziness; loss of coordination This list may not describe all possible side effects. Call your doctor for medical advice about side effects. You may report side effects to FDA at 1-800-FDA-1088. Where should I keep my medicine? Keep out of the reach of children. Store at room temperature between 20 and 25 degrees C (68 and 77 degrees F). Throw away any unused medicine after the expiration date. NOTE: This sheet is a summary. It may not cover all possible information. If you have questions about this medicine, talk to your doctor, pharmacist, or health care provider.  2019 Elsevier/Gold Standard (2016-12-26 11:20:07)   Atrial Fibrillation Atrial fibrillation is a type of irregular or rapid heartbeat (arrhythmia). In atrial fibrillation, the top part of the heart (atria) quivers in a chaotic pattern. This makes the heart unable to pump blood normally. Having atrial fibrillation can increase your risk for other health problems, such as:  Blood can pool in the atria and form clots. If a clot travels to the brain, it can cause a stroke.  The heart muscle may weaken from the irregular blood flow. This can cause heart failure. Atrial  fibrillation may start suddenly and stop on its own, or it may become a long-lasting problem. What are the causes? This condition is caused by some heart-related conditions or procedures, including:  High blood pressure. This is the most common cause.  Heart failure.  Heart valve conditions.  Inflammation of the sac that surrounds the heart (pericarditis).  Heart surgery.  Coronary artery disease.  Certain heart rhythm disorders, such as Wolf-Parkinson-White syndrome. Other causes include:  Pneumonia.  Obstructive sleep apnea.  Lung cancer.  Thyroid problems, especially if the thyroid is overactive (hyperthyroidism).  Excessive alcohol or drug use. Sometimes, the cause of this condition is not known. What increases the risk? This condition is more likely to develop in:  Older people.  People who smoke.  People who have diabetes mellitus.  People who are overweight (obese).  Athletes who exercise vigorously.  People who have a family history. What are the signs or symptoms? Symptoms of this condition include:  A feeling that your heart is beating rapidly or irregularly.  A feeling of discomfort or pain in your chest.  Shortness of breath.  Sudden light-headedness or weakness.  Getting tired easily during exercise. In some cases, there are no symptoms. How is this diagnosed? Your health care provider may be able to detect atrial fibrillation when taking your pulse. If detected, this condition may  be diagnosed with:  Electrocardiogram (ECG).  Ambulatory cardiac monitor. This device records your heartbeats for 24 hours or more.  Transthoracic echocardiogram (TTE) to evaluate how blood flows through your heart.  Transesophageal echocardiogram (TEE) to view more detailed images of your heart.  A stress test.  Imaging tests, such as a CT scan or chest X-ray.  Blood tests. How is this treated? This condition may be treated with:  Medicines to slow  down the heart rate or bring the heart's rhythm back to normal.  Medicines to prevent blood clots from forming.  Electrical cardioversion. This delivers a low-energy shock to the heart to reset its rhythm.  Ablation. This procedure destroys the part of the heart tissue that sends abnormal signals.  Left atrial appendage occlusion/excision. This seals off a common place in the atria where blood clots can form (left atrial appendage). The goal of treatment is to prevent blood clots from forming and to keep your heart beating at a normal rate and rhythm. Treatment depends on underlying medical conditions and how you feel when you are experiencing fibrillation. Follow these instructions at home: Medicines  Take over-the counter and prescription medicines only as told by your health care provider.  If your health care provider prescribed a blood-thinning medicine (anticoagulant), take it exactly as told. Taking too much blood-thinning medicine can cause bleeding. Taking too little can enable a blood clot to form and travel to the brain, causing a stroke. Lifestyle      Do not use any products that contain nicotine or tobacco, such as cigarettes and e-cigarettes. If you need help quitting, ask your health care provider.  Do not drink beverages that contain caffeine, such as coffee, soda, and tea.  Follow diet instructions as told by your health care provider.  Exercise regularly as told by your health care provider.  Do not drink alcohol. General instructions  If you have obstructive sleep apnea, manage your condition as told by your health care provider.  Maintain a healthy weight. Do not use diet pills unless your health care provider approves. Diet pills may make heart problems worse.  Keep all follow-up visits as told by your health care provider. This is important. Contact a health care provider if you:  Notice a change in the rate, rhythm, or strength of your heartbeat.  Are  taking an anticoagulant and you notice increased bruising.  Tire more easily when you exercise or exert yourself.  Have a sudden change in weight. Get help right away if you have:   Chest pain, abdominal pain, sweating, or weakness.  Difficulty breathing.  Blood in your vomit, stool (feces), or urine.  Any symptoms of a stroke. "BE FAST" is an easy way to remember the main warning signs of a stroke: ? B - Balance. Signs are dizziness, sudden trouble walking, or loss of balance. ? E - Eyes. Signs are trouble seeing or a sudden change in vision. ? F - Face. Signs are sudden weakness or numbness of the face, or the face or eyelid drooping on one side. ? A - Arms. Signs are weakness or numbness in an arm. This happens suddenly and usually on one side of the body. ? S - Speech. Signs are sudden trouble speaking, slurred speech, or trouble understanding what people say. ? T - Time. Time to call emergency services. Write down what time symptoms started.  Other signs of a stroke, such as: ? A sudden, severe headache with no known cause. ? Nausea or  vomiting. ? Seizure. These symptoms may represent a serious problem that is an emergency. Do not wait to see if the symptoms will go away. Get medical help right away. Call your local emergency services (911 in the [place] Do not drive yourself to the hospital. Summary  Atrial fibrillation is a type of irregular or rapid heartbeat (arrhythmia).  Symptoms include a feeling that your heart is beating fast or irregularly. In some cases, you may not have symptoms.  The condition is treated with medicines to slow down the heart rate or bring the heart's rhythm back to normal. You may also need blood-thinning medicines to prevent blood clots.  Get help right away if you have symptoms or signs of a stroke. This information is not intended to replace advice given to you by your health care provider. Make sure you discuss any questions you have with your  health care provider. Document Released: 12/31/2004 Document Revised: 02/21/2017 Document Reviewed: 02/21/2017 Elsevier Interactive Patient Education  2019 Elsevier Inc.   Chronic Obstructive Pulmonary Disease Chronic obstructive pulmonary disease (COPD) is a long-term (chronic) lung problem. When you have COPD, it is hard for air to get in and out of your lungs. Usually the condition gets worse over time, and your lungs will never return to normal. There are things you can do to keep yourself as healthy as possible.  Your doctor may treat your condition with: ? Medicines. ? Oxygen. ? Lung surgery.  Your doctor may also recommend: ? Rehabilitation. This includes steps to make your body work better. It may involve a team of specialists. ? Quitting smoking, if you smoke. ? Exercise and changes to your diet. ? Comfort measures (palliative care). Follow these instructions at home: Medicines  Take over-the-counter and prescription medicines only as told by your doctor.  Talk to your doctor before taking any cough or allergy medicines. You may need to avoid medicines that cause your lungs to be dry. Lifestyle  If you smoke, stop. Smoking makes the problem worse. If you need help quitting, ask your doctor.  Avoid being around things that make your breathing worse. This may include smoke, chemicals, and fumes.  Stay active, but remember to rest as well.  Learn and use tips on how to relax.  Make sure you get enough sleep. Most adults need at least 7 hours of sleep every night.  Eat healthy foods. Eat smaller meals more often. Rest before meals. Controlled breathing Learn and use tips on how to control your breathing as told by your doctor. Try:  Breathing in (inhaling) through your nose for 1 second. Then, pucker your lips and breath out (exhale) through your lips for 2 seconds.  Putting one hand on your belly (abdomen). Breathe in slowly through your nose for 1 second. Your hand  on your belly should move out. Pucker your lips and breathe out slowly through your lips. Your hand on your belly should move in as you breathe out.  Controlled coughing Learn and use controlled coughing to clear mucus from your lungs. Follow these steps: 1. Lean your head a little forward. 2. Breathe in deeply. 3. Try to hold your breath for 3 seconds. 4. Keep your mouth slightly open while coughing 2 times. 5. Spit any mucus out into a tissue. 6. Rest and do the steps again 1 or 2 times as needed. General instructions  Make sure you get all the shots (vaccines) that your doctor recommends. Ask your doctor about a flu shot and a  pneumonia shot.  Use oxygen therapy and pulmonary rehabilitation if told by your doctor. If you need home oxygen therapy, ask your doctor if you should buy a tool to measure your oxygen level (oximeter).  Make a COPD action plan with your doctor. This helps you to know what to do if you feel worse than usual.  Manage any other conditions you have as told by your doctor.  Avoid going outside when it is very hot, cold, or humid.  Avoid people who have a sickness you can catch (contagious).  Keep all follow-up visits as told by your doctor. This is important. Contact a doctor if:  You cough up more mucus than usual.  There is a change in the color or thickness of the mucus.  It is harder to breathe than usual.  Your breathing is faster than usual.  You have trouble sleeping.  You need to use your medicines more often than usual.  You have trouble doing your normal activities such as getting dressed or walking around the house. Get help right away if:  You have shortness of breath while resting.  You have shortness of breath that stops you from: ? Being able to talk. ? Doing normal activities.  Your chest hurts for longer than 5 minutes.  Your skin color is more blue than usual.  Your pulse oximeter shows that you have low oxygen for longer  than 5 minutes.  You have a fever.  You feel too tired to breathe normally. Summary  Chronic obstructive pulmonary disease (COPD) is a long-term lung problem.  The way your lungs work will never return to normal. Usually the condition gets worse over time. There are things you can do to keep yourself as healthy as possible.  Take over-the-counter and prescription medicines only as told by your doctor.  If you smoke, stop. Smoking makes the problem worse. This information is not intended to replace advice given to you by your health care provider. Make sure you discuss any questions you have with your health care provider. Document Released: 06/19/2007 Document Revised: 02/05/2016 Document Reviewed: 02/05/2016 Elsevier Interactive Patient Education  2019 [employer].

## 2018-03-28 NOTE — Discharge Summary (Signed)
DISCHARGE SUMMARY  Allison Vaughan  MR#: [id number]  DOB:08-06-1945  Date of Admission: 03/25/2018 Date of Discharge: 03/28/2018  Attending Physician: Hennie Duos, MD  Patient's WUJ:[id number], Sami, MD  Consults: none   Disposition: D/C home   Follow-up Appts: Follow-up Information    Antonietta Jewel, MD. Schedule an appointment as soon as possible for a visit in 5 day(s).   Specialty:  Internal Medicine Contact information: 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 - acute COPD exacerbation  Slowly improved - able to be weaned to RA prior to d/c - counseled on need to avoid tobacco - maintenance inhaled meds begun - should f/u w/ Pulmonologist in outpt setting for long term maintenance and adjustment of meds as needed    Parox Atrial Fibrillation  Noted during admit Feb 2019, w/ question of MAT - was evaluated by Cards at that time - EKG this admit suggests true paroxysmal Afib, w/ episodes of RVR - CCB added and tolerated well - spontaneously converted back to NSR 3/13 morning - Eliquis started   HTN BP variable - CCB started during this admit - will need ongoing outpt f/u   HLD Cont Lipitor  (Zocor stopped due to addition of CCB)   Prior hx of Systolic CHF EF has recovered per TTE this admit - no overload on exam   Lactic acidosis Due to increased work of breathing - no clinical evidence of septic shock  Physical deconditioning complained of multiple falls at home - PT/OT suggested [facility] therapy, which was ordered    Chronic methadone use  On hold during admission in setting of hypoxic resp failure - advised to f/u w/ her pain med MD to discuss when and how it should be resumed    Allergies as of 03/28/2018   No Known Allergies     Medication List    STOP taking these medications   amLODipine 10 MG tablet Commonly known as:  NORVASC   budesonide 0.25 MG/2ML nebulizer solution Commonly known as:  PULMICORT   guaiFENesin 600 MG 12 hr tablet Commonly known as:  MUCINEX   ipratropium 0.02 % nebulizer solution Commonly known as:  ATROVENT   methadone 1 MG/1ML solution Commonly known as:  DOLOPHINE   potassium chloride 10 MEQ tablet Commonly known as:  K-DUR,KLOR-CON   simvastatin 20 MG tablet Commonly known as:  ZOCOR     TAKE these medications   albuterol 108 (90 Base) MCG/ACT inhaler Commonly known as:  PROVENTIL HFA;VENTOLIN HFA Inhale 2 puffs into the lungs every 6 (six) hours as needed for wheezing.   albuterol (2.5 MG/3ML) 0.083% nebulizer solution Commonly known as:  PROVENTIL Take 3 mLs (2.5 mg total) by nebulization every 2 (two) hours as needed for wheezing.   apixaban 5 MG  Tabs tablet Commonly known as:  Eliquis Take 1 tablet (5 mg total) by mouth 2 (two) times daily.   atorvastatin 10 MG tablet Commonly known as:  LIPITOR Take 1 tablet (10 mg total) by mouth at bedtime.   azithromycin 500 MG tablet Commonly known as:  Zithromax Take 1 tablet (500 mg total) by mouth daily for 3 days. Take 1 tablet daily for 3 days.   bisacodyl 10 MG suppository Commonly known as:  DULCOLAX Place 10 mg rectally as needed for moderate constipation.    diltiazem 300 MG 24 hr capsule Commonly known as:  Cardizem CD Take 1 capsule (300 mg total) by mouth daily.   feeding supplement (ENSURE ENLIVE) Liqd Take 237 mLs by mouth 2 (two) times daily between meals.   FLUoxetine 40 MG capsule Commonly known as:  PROZAC Take 40 mg by mouth every morning.   mometasone-formoterol 200-5 MCG/ACT Aero Commonly known as:  DULERA Inhale 2 puffs into the lungs 2 (two) times daily.   Oxycodone HCl 10 MG Tabs Take 10 mg by mouth 2 (two) times daily as needed for pain.   predniSONE 10 MG tablet Commonly known as:  DELTASONE 2 tablets a day for 3 days, then 1 tablet a day for 3 days, then stop   umeclidinium bromide 62.5 MCG/INH Aepb Commonly known as:  INCRUSE ELLIPTA Inhale 1 puff into the lungs daily. Start taking on:  March 29, 2018       Day of Discharge BP (!) 147/67 (BP Location: Left Arm)   Pulse 71   Temp (!) 97.5 F (36.4 C) (Oral)   Resp 19   Ht 4\' 11"  (1.499 m)   Wt 42.4 kg   SpO2 98%   BMI 18.88 kg/m   Physical Exam: General: No acute respiratory distress Lungs: Clear to auscultation bilaterally without wheezes or crackles Cardiovascular: Regular rate and rhythm without murmur  Abdomen: Nontender, nondistended, soft, bowel sounds positive, no rebound Extremities: No significant cyanosis, clubbing, or edema bilateral lower extremities  Basic Metabolic Panel: Recent Labs  Lab 03/25/18 2358 03/26/18 0127 03/26/18 0256 03/26/18 0516 03/26/18 1347 03/28/18 0321  NA 133* 132* 133* 132* 130* 131*  K 5.5* 4.9 4.5 4.8 4.7 3.7  CL 96*  --   --  96* 93* 95*  CO2 27  --   --  23 28 29   GLUCOSE 159*  --   --  157* 132* 130*  BUN 19  --   --  20 19 21   CREATININE 1.35*  --   --  1.27* 1.08* 0.93  CALCIUM 8.9  --   --  8.8* 8.7* 8.5*  MG  --   --   --   --   --  2.0    Liver Function Tests: Recent Labs  Lab 03/25/18 2358  AST 30  ALT 11  ALKPHOS 110  BILITOT 0.6  PROT 6.2*  ALBUMIN 3.5    CBC: Recent Labs   Lab 03/25/18 2358 03/26/18 0127 03/26/18 0256 03/26/18 0516 03/26/18 1347 03/28/18 0321  WBC 11.4*  --   --  6.6 7.3 10.6*  NEUTROABS  --   --   --  6.3 6.8  --   HGB 11.3* 10.2* 10.5* 10.1* 10.1* 10.8*  HCT 36.5 30.0* 31.0* 32.0* 30.3* 32.4*  MCV 95.1  --   --  92.2 89.4 88.3  PLT 220  --   --  186 167 206    Cardiac Enzymes: Recent Labs  Lab 03/25/18  2358 03/26/18 0516  TROPONINI <0.03 <0.03   CBG: Recent Labs  Lab 03/26/18 0836 03/27/18 0801 03/28/18 0756  GLUCAP 133* 127* 163*    Time spent in discharge (includes decision making & examination of pt): 35 minutes  03/28/2018, 12:11 PM   Cherene Altes, MD [facility] Office  [phone]

## 2018-03-28 NOTE — Progress Notes (Signed)
Occupational Therapy Treatment Patient Details Name: Allison Vaughan MRN: [id number] DOB: [birth year] Today's Date: 03/28/2018    History of present illness Patient is 73 year old female with history of COPD, ongoing smoker, chronic systolic heart failure with known ejection fraction of 45%, hypertension, chronic pain who is admitted from emergency room early morning hours where she presented with acute respiratory distress.    OT comments  Pt progressing toward established goals. Pt currently requires setupA for feeding having difficulty opening containers, pt requires minguard for ADL and minguard for functional mobility. Pt stated "I am leaving today no matter what", discussed pt's rights and importance of physician recommendation. Nsg aware. Pt will continue to benefit from skilled OT services to maximize safety and independence with ADL and functional mobility to allow safe d/c to venue listed below. Will continue to follow acutely.     Follow Up Recommendations  Home health OT;Supervision/Assistance - 24 hour    Equipment Recommendations  None recommended by OT    Recommendations for Other Services      Precautions / Restrictions Precautions Precautions: Fall Restrictions Weight Bearing Restrictions: No       Mobility Bed Mobility Overal bed mobility: Independent                Transfers Overall transfer level: Needs assistance Equipment used: None Transfers: Sit to/from Stand Sit to Stand: Min guard         General transfer comment: min guard for safety    Balance Overall balance assessment: Mild deficits observed, not formally tested                                         ADL either performed or assessed with clinical judgement   ADL Overall ADL's : Needs assistance/impaired Eating/Feeding: Set up Eating/Feeding Details (indicate cue type and reason): pt demonstrated difficulty opening containers requiring setupA Grooming: Min  guard;Standing           Upper Body Dressing : Min guard;Standing       Toilet Transfer: Min guard;Ambulation Toilet Transfer Details (indicate cue type and reason): simulated sit<>stand from/return to recliner         Functional mobility during ADLs: Min guard       Vision       Perception     Praxis      Cognition Arousal/Alertness: Awake/alert Behavior During Therapy: WFL for tasks assessed/performed Overall Cognitive Status: No family/caregiver present to determine baseline cognitive functioning Area of Impairment: Safety/judgement;Awareness                         Safety/Judgement: Decreased awareness of safety;Decreased awareness of deficits Awareness: Emergent   General Comments: pt appeared to have decreased awareness of line management, was impulsive with mobility        Exercises     Shoulder Instructions       General Comments VSS during session    Pertinent Vitals/ Pain       Pain Assessment: No/denies pain  Home Living                                          Prior Functioning/Environment              Frequency  Min 2X/week  Progress Toward Goals  OT Goals(current goals can now be found in the care plan section)  Progress towards OT goals: Progressing toward goals  Acute Rehab OT Goals Patient Stated Goal: to go home OT Goal Formulation: With patient Time For Goal Achievement: 04/09/18 Potential to Achieve Goals: Good ADL Goals Pt Will Perform Grooming: with modified independence;standing Pt Will Perform Upper Body Bathing: with modified independence;sitting Pt Will Perform Lower Body Dressing: with modified independence;sit to/from stand Pt Will Transfer to Toilet: with modified independence;ambulating  Plan Discharge plan remains appropriate    Co-evaluation                 AM-PAC OT "6 Clicks" Daily Activity     Outcome Measure   Help from another person eating meals?:  A Little Help from another person taking care of personal grooming?: A Little Help from another person toileting, which includes using toliet, bedpan, or urinal?: A Little Help from another person bathing (including washing, rinsing, drying)?: A Little Help from another person to put on and taking off regular upper body clothing?: A Little Help from another person to put on and taking off regular lower body clothing?: A Little 6 Click Score: 18    End of Session    OT Visit Diagnosis: Unsteadiness on feet (R26.81);Other abnormalities of gait and mobility (R26.89);Muscle weakness (generalized) (M62.81);History of falling (Z91.81);Repeated falls (R29.6);Pain;Other symptoms and signs involving cognitive function   Activity Tolerance Patient tolerated treatment well   Patient Left in chair;with call bell/phone within reach   Nurse Communication Mobility status        Time: 1840-3754 OT Time Calculation (min): 18 min  Charges: OT General Charges $OT Visit: 1 Visit OT Treatments $Self Care/Home Management : 8-22 mins  [place] Office: [place] 03/28/2018, 10:56 AM

## 2018-03-28 NOTE — TOC Transition Note (Signed)
Transition of Care [facility]) - CM/SW Discharge Note   Patient Details  Name: Allison Vaughan MRN: [id number] Date of Birth: 07/16/1945  [facility] CM/SW Contact:  Carles Collet, RN Phone Number: 03/28/2018, 12:28 PM   Clinical Narrative:     Patient declines [facility] services, she will DC to home, given Eliquis 30 day coupon, reviewed copay. No other CM needs identified.   Final next level of care: Home/Self Care Barriers to Discharge: No Barriers Identified   Patient Goals and CMS Choice Patient states their goals for this hospitalization and ongoing recovery are:: Just ready to get home CMS Medicare.gov Compare Post Acute Care list provided to:: Patient Choice offered to / list presented to : Patient  Discharge Placement                       Discharge Plan and Services                        Social Determinants of Health (SDOH) Interventions     Readmission Risk Interventions No flowsheet data found.

## 2018-04-01 DIAGNOSIS — R062 Wheezing: Secondary | ICD-10-CM | POA: Diagnosis not present

## 2018-04-01 DIAGNOSIS — J441 Chronic obstructive pulmonary disease with (acute) exacerbation: Secondary | ICD-10-CM | POA: Diagnosis not present

## 2018-04-08 ENCOUNTER — Emergency Department (HOSPITAL_COMMUNITY): Payer: Medicare HMO

## 2018-04-08 ENCOUNTER — Encounter (HOSPITAL_COMMUNITY): Payer: Self-pay

## 2018-04-08 ENCOUNTER — Emergency Department (HOSPITAL_COMMUNITY)
Admission: EM | Admit: 2018-04-08 | Discharge: 2018-04-08 | Disposition: A | Discharge status: Home / Self Care | Payer: Medicare HMO | Attending: Emergency Medicine | Admitting: Emergency Medicine

## 2018-04-08 ENCOUNTER — Other Ambulatory Visit: Payer: Self-pay

## 2018-04-08 DIAGNOSIS — R109 Unspecified abdominal pain: Secondary | ICD-10-CM | POA: Diagnosis present

## 2018-04-08 DIAGNOSIS — I4891 Unspecified atrial fibrillation: Secondary | ICD-10-CM | POA: Diagnosis not present

## 2018-04-08 DIAGNOSIS — R112 Nausea with vomiting, unspecified: Secondary | ICD-10-CM | POA: Diagnosis not present

## 2018-04-08 DIAGNOSIS — I959 Hypotension, unspecified: Secondary | ICD-10-CM | POA: Diagnosis not present

## 2018-04-08 DIAGNOSIS — J441 Chronic obstructive pulmonary disease with (acute) exacerbation: Secondary | ICD-10-CM | POA: Insufficient documentation

## 2018-04-08 DIAGNOSIS — F17210 Nicotine dependence, cigarettes, uncomplicated: Secondary | ICD-10-CM | POA: Diagnosis not present

## 2018-04-08 DIAGNOSIS — R1011 Right upper quadrant pain: Secondary | ICD-10-CM | POA: Diagnosis not present

## 2018-04-08 DIAGNOSIS — R05 Cough: Secondary | ICD-10-CM | POA: Diagnosis not present

## 2018-04-08 DIAGNOSIS — Z79899 Other long term (current) drug therapy: Secondary | ICD-10-CM | POA: Diagnosis not present

## 2018-04-08 DIAGNOSIS — E860 Dehydration: Secondary | ICD-10-CM | POA: Diagnosis not present

## 2018-04-08 DIAGNOSIS — R1084 Generalized abdominal pain: Secondary | ICD-10-CM | POA: Diagnosis not present

## 2018-04-08 DIAGNOSIS — R52 Pain, unspecified: Secondary | ICD-10-CM | POA: Diagnosis not present

## 2018-04-08 DIAGNOSIS — I10 Essential (primary) hypertension: Secondary | ICD-10-CM | POA: Diagnosis not present

## 2018-04-08 LAB — CBC WITH DIFFERENTIAL/PLATELET
Abs Immature Granulocytes: 0.07 10*3/uL (ref 0.00–0.07)
Basophils Absolute: 0 10*3/uL (ref 0.0–0.1)
Basophils Relative: 0 %
Eosinophils Absolute: 0 10*3/uL (ref 0.0–0.5)
Eosinophils Relative: 0 %
HEMATOCRIT: 33.6 % — AB (ref 36.0–46.0)
Hemoglobin: 11.3 g/dL — ABNORMAL LOW (ref 12.0–15.0)
Immature Granulocytes: 1 %
Lymphocytes Relative: 9 %
Lymphs Abs: 1 10*3/uL (ref 0.7–4.0)
MCH: 29.5 pg (ref 26.0–34.0)
MCHC: 33.6 g/dL (ref 30.0–36.0)
MCV: 87.7 fL (ref 80.0–100.0)
Monocytes Absolute: 0.8 10*3/uL (ref 0.1–1.0)
Monocytes Relative: 7 %
Neutro Abs: 8.8 10*3/uL — ABNORMAL HIGH (ref 1.7–7.7)
Neutrophils Relative %: 83 %
Platelets: 268 10*3/uL (ref 150–400)
RBC: 3.83 MIL/uL — ABNORMAL LOW (ref 3.87–5.11)
RDW: 12.7 % (ref 11.5–15.5)
WBC: 10.7 10*3/uL — ABNORMAL HIGH (ref 4.0–10.5)
nRBC: 0 % (ref 0.0–0.2)

## 2018-04-08 LAB — COMPREHENSIVE METABOLIC PANEL
ALT: 9 U/L (ref 0–44)
AST: 14 U/L — ABNORMAL LOW (ref 15–41)
Albumin: 3 g/dL — ABNORMAL LOW (ref 3.5–5.0)
Alkaline Phosphatase: 80 U/L (ref 38–126)
Anion gap: 10 (ref 5–15)
BILIRUBIN TOTAL: 0.7 mg/dL (ref 0.3–1.2)
BUN: 6 mg/dL — ABNORMAL LOW (ref 8–23)
CALCIUM: 8.5 mg/dL — AB (ref 8.9–10.3)
CO2: 24 mmol/L (ref 22–32)
Chloride: 99 mmol/L (ref 98–111)
Creatinine, Ser: 0.68 mg/dL (ref 0.44–1.00)
GFR calc Af Amer: >60 mL/min (ref >60)
Glucose, Bld: 107 mg/dL — ABNORMAL HIGH (ref 70–99)
Potassium: 3 mmol/L — ABNORMAL LOW (ref 3.5–5.1)
Sodium: 133 mmol/L — ABNORMAL LOW (ref 135–145)
Total Protein: 5.5 g/dL — ABNORMAL LOW (ref 6.5–8.1)

## 2018-04-08 LAB — TYPE AND SCREEN
ABO/RH(D): O POS
Antibody Screen: NEGATIVE

## 2018-04-08 LAB — I-STAT TROPONIN, ED: Troponin i, poc: 0 ng/mL (ref 0.00–0.08)

## 2018-04-08 LAB — ABO/RH: ABO/RH(D): O POS

## 2018-04-08 LAB — LIPASE, BLOOD: LIPASE: 18 U/L (ref 11–51)

## 2018-04-08 MED ORDER — IPRATROPIUM BROMIDE 0.02 % IN SOLN
0.5000 mg | Freq: Once | RESPIRATORY_TRACT | Status: AC
  Administered 2018-04-08: 0.5 mg via RESPIRATORY_TRACT
  Filled 2018-04-08: qty 2.5

## 2018-04-08 MED ORDER — ONDANSETRON HCL 4 MG PO TABS: 4.0000 mg | ORAL_TABLET | Freq: Four times a day (QID) | ORAL | 0 refills | Status: DC

## 2018-04-08 MED ORDER — ONDANSETRON HCL 4 MG/2ML IJ SOLN: 4.0000 mg | Freq: Once | INTRAMUSCULAR | Status: DC

## 2018-04-08 MED ORDER — ALBUTEROL SULFATE (2.5 MG/3ML) 0.083% IN NEBU
5.0000 mg | INHALATION_SOLUTION | Freq: Once | RESPIRATORY_TRACT | Status: AC
  Administered 2018-04-08: 5 mg via RESPIRATORY_TRACT
  Filled 2018-04-08: qty 6

## 2018-04-08 MED ORDER — LORAZEPAM 1 MG PO TABS
1.0000 mg | ORAL_TABLET | Freq: Once | ORAL | Status: AC
  Administered 2018-04-08: 1 mg via ORAL
  Filled 2018-04-08: qty 1

## 2018-04-08 MED ORDER — SODIUM CHLORIDE 0.9 % IV SOLN
INTRAVENOUS | Status: DC
  Administered 2018-04-08: 17:00:00 via INTRAVENOUS

## 2018-04-08 MED ORDER — SODIUM CHLORIDE 0.9 % IV BOLUS
500.0000 mL | Freq: Once | INTRAVENOUS | Status: AC
  Administered 2018-04-08: 500 mL via INTRAVENOUS

## 2018-04-08 NOTE — ED Notes (Signed)
  Patient tolerated some apple sauce and ginger ale.  Patient was ambulated to bathroom with minimal assistance.

## 2018-04-08 NOTE — Discharge Instructions (Signed)
Today you had a evaluation of your liver, pancreas kidneys all which are within normal limits.  There is no evidence of any problems with your heart based on testing and no signs of pneumonia.  You were slightly dehydrated from the nausea and vomiting but no evidence of any lost blood.  Oxygen is good today.  You did receive IV fluids and nausea medicine.  You have been given a prescription for nausea medicine.  If you develop fever, trouble breathing, return of abdominal pain or persistent vomiting you should return to the emergency room.

## 2018-04-08 NOTE — ED Provider Notes (Signed)
[facility] EMERGENCY DEPARTMENT Provider Note   CSN: [id number] Arrival date & time: 04/08/18  1538    History   Chief Complaint Chief Complaint  Patient presents with  . Abdominal Pain  . Nausea  . Emesis    HPI Allison Vaughan is a 73 y.o. female.     Patient is a 73 year old female with multiple medical problems including COPD, paroxysmal atrial fibrillation recently started on Eliquis 10 days ago, chronic pain syndrome on chronic methadone presenting today with abdominal pain and vomiting.  Patient states the abdominal pain started yesterday and she is describes as a severe 10 out of 10 pain that is sharp in her upper abdomen.  She states it caused her to vomit numerous times and she was vomiting up black looking material.  She had some mild loose stool today but it did not appear black.  She states now her abdomen does not hurt near as bad as it did but if she tries to eat or drink anything she will vomit.  She denies alcohol use and states she feels generally weak and slightly short of breath but has been using her inhalers at home.  She denies productive cough or fever.  She was hospitalized and discharged approximately 10 days ago but states she has completed her course of prednisone and she is not taking that anymore and only taking her maintenance medications.  No prior abdominal surgeries.  The history is provided by the patient and the EMS personnel.  Abdominal Pain  Associated symptoms: vomiting   Emesis  Associated symptoms: abdominal pain     Past Medical History:  Diagnosis Date  . Accidental drug overdose   . Acute encephalopathy   . Acute on chronic respiratory failure with hypoxia ([place]) 03/01/2017  . Acute renal failure ([place]) 04/16/2014  . AKI (acute kidney injury) ([place]) 04/30/2014  . Altered mental status 06/27/2012  . Anxiety   . Atrial fibrillation ([place])   . CAD (coronary artery disease)   . CAP (community acquired pneumonia) 04/18/2014   . Cardiomyopathy ([place])   . Chronic hypoxemic respiratory failure ([place]) 06/26/2012  . Chronic pain   . COPD exacerbation ([place]) 06/26/2012  . COPD with acute exacerbation ([place]) 03/01/2017  . DEPRESSIVE DISORDER, NOS 03/13/2006   Qualifier: Diagnosis of  By: Damita Dunnings MD, Phillip Heal    . Edema, peripheral   . Essential hypertension 04/30/2014  . Fall at home, initial encounter 03/01/2017  . GASTROESOPHAGEAL REFLUX, NO ESOPHAGITIS 03/13/2006   Qualifier: Diagnosis of  By: Damita Dunnings MD, Phillip Heal    . HYPERCHOLESTEROLEMIA 03/13/2006   Qualifier: Diagnosis of  By: Damita Dunnings MD, Phillip Heal    . Hyperlipidemia   . HYPERTENSION, BENIGN SYSTEMIC 03/13/2006   Qualifier: Diagnosis of  By: Damita Dunnings MD, Phillip Heal    . Hypokalemia 04/30/2014  . Hyponatremia 04/30/2014  . Hypotension 04/30/2014  . Hypoxemia 06/26/2012  . Hypoxia 03/26/2018  . Irritable bowel syndrome 03/13/2006   Qualifier: Diagnosis of  By: Damita Dunnings MD, Phillip Heal    . Lung nodule 04/16/2014  . MENOPAUSAL SYNDROME 03/13/2006   Qualifier: Diagnosis of  By: Damita Dunnings MD, Phillip Heal    . Mood disorder [facility]) 03/13/2006   Qualifier: Diagnosis of  By: Damita Dunnings MD, Phillip Heal    . 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 Nausea 04/30/2014  . Nausea vomiting and diarrhea 04/16/2014  . OSTEOARTHRITIS, MULTI SITES 03/13/2006   Qualifier: Diagnosis of  By: Damita Dunnings MD, Phillip Heal    . Pulmonary nodule 04/30/2014  . Respiratory failure (  [place]) 01/22/2017  . TOBACCO DEPENDENCE 03/13/2006   Qualifier: Diagnosis of  By: Damita Dunnings MD, Phillip Heal    . Weight loss     Patient Active Problem List   Diagnosis Date Noted  . Mild renal insufficiency 03/26/2018  . 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 Atrial fibrillation with RVR ([place]) 03/01/2017  . Acute on chronic respiratory failure with hypoxia ([place]) 03/01/2017  . Fall at home, initial encounter 03/01/2017  . Accidental drug overdose   . Respiratory failure ([place]) 01/22/2017  . AKI (acute kidney injury) ([place]) 04/30/2014  . Pulmonary nodule 04/30/2014  . Essential  hypertension 04/30/2014  . Hypokalemia 04/30/2014  . Hyponatremia 04/30/2014  . Hypotension 04/30/2014  . Nausea 04/30/2014  . Lung nodule 04/16/2014  . Chronic hypoxemic respiratory failure ([place]) 06/26/2012  . COPD (chronic obstructive pulmonary disease) ([place]) 06/26/2012  . HYPERCHOLESTEROLEMIA 03/13/2006  . TOBACCO DEPENDENCE 03/13/2006  . Mood disorder ([place]) 03/13/2006  . HYPERTENSION, BENIGN SYSTEMIC 03/13/2006  . GASTROESOPHAGEAL REFLUX, NO ESOPHAGITIS 03/13/2006  . IRRITABLE BOWEL SYNDROME 03/13/2006  . MENOPAUSAL SYNDROME 03/13/2006  . OSTEOARTHRITIS, MULTI SITES 03/13/2006    Past Surgical History:  Procedure Laterality Date  . EYE SURGERY    . ROTATOR CUFF REPAIR Right 2012     OB History   No obstetric history on file.      Home Medications    Prior to Admission medications   Medication Sig Start Date End Date Taking? Authorizing Provider  albuterol (PROVENTIL HFA;VENTOLIN HFA) 108 (90 BASE) MCG/ACT inhaler Inhale 2 puffs into the lungs every 6 (six) hours as needed for wheezing. 06/09/12   Monika Salk, MD  albuterol (PROVENTIL) (2.5 MG/3ML) 0.083% nebulizer solution Take 3 mLs (2.5 mg total) by nebulization every 2 (two) hours as needed for wheezing. 04/20/14   Rama, Venetia Maxon, MD  apixaban (ELIQUIS) 5 MG TABS tablet Take 1 tablet (5 mg total) by mouth 2 (two) times daily. 03/28/18   Cherene Altes, MD  atorvastatin (LIPITOR) 10 MG tablet Take 1 tablet (10 mg total) by mouth at bedtime. 03/28/18   Cherene Altes, MD  bisacodyl (DULCOLAX) 10 MG suppository Place 10 mg rectally as needed for moderate constipation.    [provider]  diltiazem (CARDIZEM CD) 300 MG 24 hr capsule Take 1 capsule (300 mg total) by mouth daily. 03/28/18 03/28/19  Cherene Altes, MD  feeding supplement, ENSURE ENLIVE, (ENSURE ENLIVE) LIQD Take 237 mLs by mouth 2 (two) times daily between meals. 03/03/17   Aline August, MD  FLUoxetine (PROZAC) 40 MG capsule Take 40 mg by  mouth every morning.     [provider]  mometasone-formoterol (DULERA) 200-5 MCG/ACT AERO Inhale 2 puffs into the lungs 2 (two) times daily. 03/28/18   Cherene Altes, MD  Oxycodone HCl 10 MG TABS Take 10 mg by mouth 2 (two) times daily as needed for pain. 03/23/18   [provider]  predniSONE (DELTASONE) 10 MG tablet 2 tablets a day for 3 days, then 1 tablet a day for 3 days, then stop 03/28/18   Joette Catching T, MD  umeclidinium bromide (INCRUSE ELLIPTA) 62.5 MCG/INH AEPB Inhale 1 puff into the lungs daily. 03/29/18   Cherene Altes, MD    Family History Family History  Problem Relation Age of Onset  . CAD Father   . Lung cancer Brother   . Healthy Son   . Healthy Daughter   . Healthy Daughter     Social History Social History  Tobacco Use  . Smoking status: Current Every Day Smoker    Packs/day: 1.00    Types: Cigarettes  . Smokeless tobacco: Never Used  Substance Use Topics  . Alcohol use: No    Frequency: Never    Comment: not in a long time  . Drug use: No     Allergies   Patient has no known allergies.   Review of Systems Review of Systems  Gastrointestinal: Positive for abdominal pain and vomiting.  All other systems reviewed and are negative.    Physical Exam Updated Vital Signs BP 125/81   Pulse 92   Temp 98.1 F (36.7 C) (Oral)   Resp 19   Ht 4\' 11"  (1.499 m)   Wt 42.4 kg   SpO2 98%   BMI 18.88 kg/m   Physical Exam Vitals signs and nursing note reviewed.  Constitutional:      General: She is not in acute distress.    Appearance: She is well-developed. She is cachectic.  HENT:     Head: Normocephalic and atraumatic.     Nose: Nose normal.     Mouth/Throat:     Mouth: Mucous membranes are dry.  Eyes:     Pupils: Pupils are equal, round, and reactive to light.  Cardiovascular:     Rate and Rhythm: Normal rate. Rhythm irregularly irregular.     Heart sounds: Normal heart sounds. No murmur. No friction rub.   Pulmonary:     Effort: Pulmonary effort is normal.     Breath sounds: Wheezing present. No rales.  Abdominal:     General: Bowel sounds are normal. There is distension.     Palpations: Abdomen is soft. There is hepatomegaly.     Tenderness: There is abdominal tenderness in the right upper quadrant. There is guarding. There is no rebound.  Musculoskeletal: Normal range of motion.        General: No tenderness.     Right lower leg: No edema.     Left lower leg: No edema.     Comments: No edema  Skin:    General: Skin is warm and dry.     Capillary Refill: Capillary refill takes 2 to 3 seconds.     Findings: No rash.  Neurological:     General: No focal deficit present.     Mental Status: She is alert and oriented to person, place, and time. Mental status is at baseline.     Cranial Nerves: No cranial nerve deficit.  Psychiatric:        Mood and Affect: Mood normal.        Behavior: Behavior normal.        Thought Content: Thought content normal.      ED Treatments / Results  Labs (all labs ordered are listed, but only abnormal results are displayed) Labs Reviewed  CBC WITH DIFFERENTIAL/PLATELET - Abnormal; Notable for the following components:      Result Value   WBC 10.7 (*)    RBC 3.83 (*)    Hemoglobin 11.3 (*)    HCT 33.6 (*)    Neutro Abs 8.8 (*)    All other components within normal limits  COMPREHENSIVE METABOLIC PANEL - Abnormal; Notable for the following components:   Sodium 133 (*)    Potassium 3.0 (*)    Glucose, Bld 107 (*)    BUN 6 (*)    Calcium 8.5 (*)    Total Protein 5.5 (*)    Albumin 3.0 (*)  AST 14 (*)    All other components within normal limits  LIPASE, BLOOD  I-STAT TROPONIN, ED  TYPE AND SCREEN  ABO/RH    EKG EKG Interpretation  Date/Time:  Wednesday April 08 2018 15:46:14 EDT Ventricular Rate:  94 PR Interval:    QRS Duration: 83 QT Interval:  452 QTC Calculation: 538 R Axis:   73 Text Interpretation:  Atrial fibrillation  Ventricular premature complex Consider anterior infarct Prolonged QT interval No significant change since last tracing Confirmed by Blanchie Dessert ([id number]) on 04/08/2018 4:51:17 PM   Radiology Dg Chest 2 View  Result Date: 04/08/2018 CLINICAL DATA:  Cough and nausea EXAM: CHEST - 2 VIEW COMPARISON:  03/26/2018 FINDINGS: Stable cardiomegaly with aortic atherosclerosis. No acute pulmonary consolidation, edema or pneumothorax. Remote lateral eighth and ninth rib fractures. Dextroconvex curvature of the lower thoracic spine with thoracolumbar spondylosis. IMPRESSION: Stable cardiomegaly with aortic atherosclerosis. No active pulmonary disease. Electronically Signed   By: Ashley Royalty M.D.   On: 04/08/2018 18:37    Procedures Procedures (including critical care time)  Medications Ordered in ED Medications  albuterol (PROVENTIL) (2.5 MG/3ML) 0.083% nebulizer solution 5 mg (has no administration in time range)  ipratropium (ATROVENT) nebulizer solution 0.5 mg (has no administration in time range)  sodium chloride 0.9 % bolus 500 mL (has no administration in time range)  ondansetron (ZOFRAN) injection 4 mg (has no administration in time range)  0.9 %  sodium chloride infusion (has no administration in time range)     Initial Impression / Assessment and Plan / ED Course  I have reviewed the triage vital signs and the nursing notes.  Pertinent labs & imaging results that were available during my care of the patient were reviewed by me and considered in my medical decision making (see chart for details).       Elderly female with multiple medical problems presenting today with abdominal pain and vomiting that started yesterday.  She states the abdominal pain is better today but is still having pain most localized in the right upper quadrant.  Patient is very thin however concern for hepatomegaly on exam.  Low suspicion for perforation at this time.  However she did report vomiting up black  material without a history of taking Pepto-Bismol or iron.  Patient states bowels were runny today but appeared normal color.  She denies fever, cough and states slight shortness of breath but has been using her inhalers.  She denies a productive cough.  She has no chest pain and no evidence of fluid overload.  She is not an alcohol abuser no prior history of liver disease.  Concern for gallbladder pathology versus viral etiology versus possible withdrawal if she has not had her methadone versus GI bleed as she did recently start on Eliquis for paroxysmal atrial fibrillation.  Labs are pending.  Patient given IV fluids, nausea control and a breathing treatment.  4:30 PM Pt calls me to the room and confesses that she has been snorting heroin at home for the last few weeks.  She states that sx started after snorting yesterday.  Never injects.  States she has chronic pain and she has pain medicine at home but she was afraid she would run out.  She also states she is currently out of her anxiety medication.  Patient's labs show negative troponin, EKG with atrial fibrillation that is rate controlled.  CBC with mild leukocytosis of 10 but unchanged from prior and normal hemoglobin.  CMP with hypokalemia of 3.0  but otherwise no acute findings LFTs and lipase are within normal limits.  6:45 PM Chest x-ray without acute findings..  Given all labs are reassuring vital signs are reassuring patient has normal mental status and no signs of respiratory distress or cardiac issues.  Some of her abdominal pain may be related to drug use.  Patient is hungry now will p.o. challenge to ensure no further vomiting or return of pain that would require CT scan.  8:38 PM Pt tolerating po's without vomiting.  No further abd pain.  Feel pt is safe for d/c.  Will give ppx for zofran. Final Clinical Impressions(s) / ED Diagnoses   Final diagnoses:  Nausea and vomiting, intractability of vomiting not specified, unspecified vomiting  type  Dehydration    ED Discharge Orders         Ordered    ondansetron (ZOFRAN) 4 MG tablet  Every 6 hours     04/08/18 2040           Blanchie Dessert, MD 04/08/18 2040

## 2018-04-08 NOTE — ED Notes (Signed)
Pt given [place] sandwich and ginger ale for PO challenge per Dr. Maryan Rued.

## 2018-04-08 NOTE — ED Triage Notes (Signed)
Pt arrived with [facility] c/o nausea and vomitting since yesterday, and diarrhea that started today. Pt reports epigastric pain and that she "threw up black stuff." Pt has hx of of COPD and HTN.   EMS vitals: BP 110/60 HR 88 RR 18 O2 98 RA CBG 116 Temp 99

## 2018-04-08 NOTE — ED Notes (Signed)
ED Provider at bedside. 

## 2018-04-30 DIAGNOSIS — F419 Anxiety disorder, unspecified: Secondary | ICD-10-CM | POA: Diagnosis not present

## 2018-04-30 DIAGNOSIS — J9621 Acute and chronic respiratory failure with hypoxia: Secondary | ICD-10-CM | POA: Diagnosis not present

## 2018-04-30 DIAGNOSIS — J441 Chronic obstructive pulmonary disease with (acute) exacerbation: Secondary | ICD-10-CM | POA: Diagnosis not present

## 2018-04-30 DIAGNOSIS — I480 Paroxysmal atrial fibrillation: Secondary | ICD-10-CM | POA: Diagnosis not present

## 2018-05-02 DIAGNOSIS — J441 Chronic obstructive pulmonary disease with (acute) exacerbation: Secondary | ICD-10-CM | POA: Diagnosis not present

## 2018-05-02 DIAGNOSIS — R062 Wheezing: Secondary | ICD-10-CM | POA: Diagnosis not present

## 2018-06-01 DIAGNOSIS — J441 Chronic obstructive pulmonary disease with (acute) exacerbation: Secondary | ICD-10-CM | POA: Diagnosis not present

## 2018-06-01 DIAGNOSIS — R062 Wheezing: Secondary | ICD-10-CM | POA: Diagnosis not present

## 2018-06-02 DIAGNOSIS — R079 Chest pain, unspecified: Secondary | ICD-10-CM | POA: Diagnosis not present

## 2018-06-02 DIAGNOSIS — S299XXA Unspecified injury of thorax, initial encounter: Secondary | ICD-10-CM | POA: Diagnosis not present

## 2018-06-02 DIAGNOSIS — R600 Localized edema: Secondary | ICD-10-CM | POA: Diagnosis not present

## 2018-06-02 DIAGNOSIS — S59902A Unspecified injury of left elbow, initial encounter: Secondary | ICD-10-CM | POA: Diagnosis not present

## 2018-06-02 DIAGNOSIS — I4891 Unspecified atrial fibrillation: Secondary | ICD-10-CM | POA: Diagnosis not present

## 2018-06-02 DIAGNOSIS — M25522 Pain in left elbow: Secondary | ICD-10-CM | POA: Diagnosis not present

## 2018-06-02 DIAGNOSIS — R8290 Unspecified abnormal findings in urine: Secondary | ICD-10-CM | POA: Diagnosis not present

## 2018-06-02 DIAGNOSIS — Z5181 Encounter for therapeutic drug level monitoring: Secondary | ICD-10-CM | POA: Diagnosis not present

## 2018-06-02 DIAGNOSIS — J449 Chronic obstructive pulmonary disease, unspecified: Secondary | ICD-10-CM | POA: Diagnosis not present

## 2018-06-03 DIAGNOSIS — J449 Chronic obstructive pulmonary disease, unspecified: Secondary | ICD-10-CM | POA: Diagnosis not present

## 2018-06-08 ENCOUNTER — Other Ambulatory Visit: Payer: Self-pay

## 2018-06-08 ENCOUNTER — Emergency Department (HOSPITAL_COMMUNITY)
Admission: EM | Admit: 2018-06-08 | Discharge: 2018-06-08 | Disposition: A | Discharge status: Home / Self Care | Payer: Medicare HMO | Attending: Emergency Medicine | Admitting: Emergency Medicine

## 2018-06-08 ENCOUNTER — Encounter (HOSPITAL_COMMUNITY): Payer: Self-pay | Admitting: Emergency Medicine

## 2018-06-08 ENCOUNTER — Emergency Department (HOSPITAL_COMMUNITY): Payer: Medicare HMO

## 2018-06-08 DIAGNOSIS — S62307A Unspecified fracture of fifth metacarpal bone, left hand, initial encounter for closed fracture: Secondary | ICD-10-CM | POA: Diagnosis not present

## 2018-06-08 DIAGNOSIS — Y9389 Activity, other specified: Secondary | ICD-10-CM | POA: Insufficient documentation

## 2018-06-08 DIAGNOSIS — R296 Repeated falls: Secondary | ICD-10-CM | POA: Insufficient documentation

## 2018-06-08 DIAGNOSIS — S51811A Laceration without foreign body of right forearm, initial encounter: Secondary | ICD-10-CM | POA: Insufficient documentation

## 2018-06-08 DIAGNOSIS — Y999 Unspecified external cause status: Secondary | ICD-10-CM | POA: Insufficient documentation

## 2018-06-08 DIAGNOSIS — S6992XA Unspecified injury of left wrist, hand and finger(s), initial encounter: Secondary | ICD-10-CM | POA: Diagnosis present

## 2018-06-08 DIAGNOSIS — R000 Tachycardia, unspecified: Secondary | ICD-10-CM | POA: Diagnosis not present

## 2018-06-08 DIAGNOSIS — I10 Essential (primary) hypertension: Secondary | ICD-10-CM | POA: Insufficient documentation

## 2018-06-08 DIAGNOSIS — Y929 Unspecified place or not applicable: Secondary | ICD-10-CM | POA: Diagnosis not present

## 2018-06-08 DIAGNOSIS — W010XXA Fall on same level from slipping, tripping and stumbling without subsequent striking against object, initial encounter: Secondary | ICD-10-CM

## 2018-06-08 DIAGNOSIS — S62337A Displaced fracture of neck of fifth metacarpal bone, left hand, initial encounter for closed fracture: Secondary | ICD-10-CM | POA: Insufficient documentation

## 2018-06-08 DIAGNOSIS — F17210 Nicotine dependence, cigarettes, uncomplicated: Secondary | ICD-10-CM | POA: Diagnosis not present

## 2018-06-08 DIAGNOSIS — Z23 Encounter for immunization: Secondary | ICD-10-CM | POA: Diagnosis not present

## 2018-06-08 DIAGNOSIS — S01111A Laceration without foreign body of right eyelid and periocular area, initial encounter: Secondary | ICD-10-CM | POA: Insufficient documentation

## 2018-06-08 DIAGNOSIS — W0110XA Fall on same level from slipping, tripping and stumbling with subsequent striking against unspecified object, initial encounter: Secondary | ICD-10-CM | POA: Insufficient documentation

## 2018-06-08 DIAGNOSIS — R42 Dizziness and giddiness: Secondary | ICD-10-CM | POA: Diagnosis not present

## 2018-06-08 DIAGNOSIS — S62336A Displaced fracture of neck of fifth metacarpal bone, right hand, initial encounter for closed fracture: Secondary | ICD-10-CM | POA: Diagnosis not present

## 2018-06-08 DIAGNOSIS — R609 Edema, unspecified: Secondary | ICD-10-CM | POA: Diagnosis not present

## 2018-06-08 DIAGNOSIS — I2510 Atherosclerotic heart disease of native coronary artery without angina pectoris: Secondary | ICD-10-CM | POA: Insufficient documentation

## 2018-06-08 DIAGNOSIS — W19XXXA Unspecified fall, initial encounter: Secondary | ICD-10-CM | POA: Diagnosis not present

## 2018-06-08 DIAGNOSIS — Z7901 Long term (current) use of anticoagulants: Secondary | ICD-10-CM

## 2018-06-08 DIAGNOSIS — S0181XA Laceration without foreign body of other part of head, initial encounter: Secondary | ICD-10-CM | POA: Diagnosis not present

## 2018-06-08 LAB — COMPREHENSIVE METABOLIC PANEL
ALT: 9 U/L (ref 0–44)
AST: 14 U/L — ABNORMAL LOW (ref 15–41)
Albumin: 3.2 g/dL — ABNORMAL LOW (ref 3.5–5.0)
Alkaline Phosphatase: 87 U/L (ref 38–126)
Anion gap: 12 (ref 5–15)
BUN: 8 mg/dL (ref 8–23)
CO2: 25 mmol/L (ref 22–32)
Calcium: 8.5 mg/dL — ABNORMAL LOW (ref 8.9–10.3)
Chloride: 96 mmol/L — ABNORMAL LOW (ref 98–111)
Creatinine, Ser: 1.02 mg/dL — ABNORMAL HIGH (ref 0.44–1.00)
GFR calc Af Amer: >60 mL/min (ref >60)
GFR calc non Af Amer: 54 mL/min — ABNORMAL LOW (ref >60)
Glucose, Bld: 110 mg/dL — ABNORMAL HIGH (ref 70–99)
Potassium: 3.9 mmol/L (ref 3.5–5.1)
Sodium: 133 mmol/L — ABNORMAL LOW (ref 135–145)
Total Bilirubin: 0.2 mg/dL — ABNORMAL LOW (ref 0.3–1.2)
Total Protein: 5.9 g/dL — ABNORMAL LOW (ref 6.5–8.1)

## 2018-06-08 LAB — CBC
HCT: 30.1 % — ABNORMAL LOW (ref 36.0–46.0)
Hemoglobin: 9.9 g/dL — ABNORMAL LOW (ref 12.0–15.0)
MCH: 29.4 pg (ref 26.0–34.0)
MCHC: 32.9 g/dL (ref 30.0–36.0)
MCV: 89.3 fL (ref 80.0–100.0)
Platelets: 308 10*3/uL (ref 150–400)
RBC: 3.37 MIL/uL — ABNORMAL LOW (ref 3.87–5.11)
RDW: 13.3 % (ref 11.5–15.5)
WBC: 6.5 10*3/uL (ref 4.0–10.5)
nRBC: 0 % (ref 0.0–0.2)

## 2018-06-08 LAB — URINALYSIS, ROUTINE W REFLEX MICROSCOPIC
Bilirubin Urine: NEGATIVE
Glucose, UA: NEGATIVE mg/dL
Hgb urine dipstick: NEGATIVE
Ketones, ur: NEGATIVE mg/dL
Leukocytes,Ua: NEGATIVE
Nitrite: NEGATIVE
Protein, ur: NEGATIVE mg/dL
Specific Gravity, Urine: 1.002 — ABNORMAL LOW (ref 1.005–1.030)
pH: 6 (ref 5.0–8.0)

## 2018-06-08 MED ORDER — TETANUS-DIPHTH-ACELL PERTUSSIS 5-2.5-18.5 LF-MCG/0.5 IM SUSP
0.5000 mL | Freq: Once | INTRAMUSCULAR | Status: AC
  Administered 2018-06-08: 15:00:00 0.5 mL via INTRAMUSCULAR
  Filled 2018-06-08: qty 0.5

## 2018-06-08 MED ORDER — LIDOCAINE-EPINEPHRINE (PF) 2 %-1:200000 IJ SOLN
20.0000 mL | Freq: Once | INTRAMUSCULAR | Status: AC
  Administered 2018-06-08: 20 mL

## 2018-06-08 MED ORDER — SODIUM CHLORIDE 0.9 % IV BOLUS
1000.0000 mL | Freq: Once | INTRAVENOUS | Status: AC
  Administered 2018-06-08: 17:00:00 1000 mL via INTRAVENOUS

## 2018-06-08 MED ORDER — SODIUM CHLORIDE 0.9 % IV SOLN
INTRAVENOUS | Status: DC
  Administered 2018-06-08: 10 mL/h via INTRAVENOUS

## 2018-06-08 NOTE — ED Notes (Addendum)
Patient's daughter Allison Vaughan [phone]) to request update. With patient's permission this RN spoke to daughter on speaker phone with patient at bedside.  Patient is encouraged to eat her meal. Patient ate all apple sauce and half of her [place] sandwich

## 2018-06-08 NOTE — Progress Notes (Signed)
Orthopedic Tech Progress Note Patient Details:  Allison Vaughan Mar 04, 1945 [id number]  Ortho Devices Type of Ortho Device: Ulna gutter splint Ortho Device/Splint Interventions: Adjustment, Application, Ordered   Post Interventions Patient Tolerated: Well Instructions Provided: Care of device, Adjustment of device   [name] T 06/08/2018, 4:09 PM

## 2018-06-08 NOTE — ED Notes (Signed)
Attempted to ambulate pt. Pt states she was dizzy. We sat pt back down.

## 2018-06-08 NOTE — ED Provider Notes (Signed)
Received the patient in signout from Dr. Kris Hartmann, briefly she is a 73 year old female that has had frequent falls recently that had a fall.  Plan is for a CT of the head and lab work if this is unremarkable and the patient is doing better than we will discharge her home.  On my repeat exam the patient appears to be somewhat sleepy she tells me that she did not get any sleep last night.  She is unsure why.  Lab work is pretty consistent with her baseline her sodium was a concern of a family member but its about as high as it is ever been at 133.  Her renal function is mildly off of her baseline.  UA is negative for infection.  CT of the head was motion degraded but otherwise negative.  Will attempt to ambulate.  I discussed results with the patient and she is eager to be discharged home.  Patient stood up and felt like she was too weak to walk.  Refusing to go any further.  We will give a bolus of IV fluids and a meal as the patient states she has had nothing to eat today and then reassess.  After fluids and a meal the patient was able to ambulate without difficulty.  Much more awake now.  Discharge home.   Deno Etienne, DO 06/08/18 2057

## 2018-06-08 NOTE — Discharge Instructions (Addendum)
Falls can be a sign that something is wrong.  Please follow-up with your family doctor and have them evaluate you to see if you need physical therapy or if you need more test to evaluate why you are having frequent falls.  You have with called the boxer's fracture.  I have given you the information for an orthopedic surgeon to follow-up if you wish.

## 2018-06-08 NOTE — ED Notes (Signed)
Pt stated she hasnt eaten and doesn't remember when she ate last and we gave her a sandwich and apple sauce. Pt is currently sleeping and snoring.

## 2018-06-08 NOTE — ED Notes (Signed)
Patient verbalizes understanding of discharge instructions. Opportunity for questioning and answers were provided. Armband removed by staff, pt discharged from ED by wheelchair with daughter to pick her up

## 2018-06-08 NOTE — ED Notes (Signed)
Pt has her cell phone at bedside and is keeping them up to date with what is going on.

## 2018-06-08 NOTE — ED Triage Notes (Addendum)
Pt arrives via ems with co dizziness and fall. Pt fell 3 times today. Swelling to left hand. Pt hit head laceration on rt eyebrow. Denies LOC. Pt daughter reports probs with pts sodium resulting in some confusion and dizziness. Pt A&Ox4  Pt takes eliquis for afib.

## 2018-06-08 NOTE — ED Notes (Addendum)
Attempted to ambulate in hall. Pt could not make it out of room. This NT and Hillary, RN at side of patient. Pt unable to hold onto walker due to wrist injury. Tyrone Nine, MD made aware.   Pt given [place] sandwich and sprite. Pt noted to be falling asleep

## 2018-06-08 NOTE — ED Provider Notes (Signed)
[facility] EMERGENCY DEPARTMENT Provider Note   CSN: [id number] Arrival date & time: 06/08/18  1422    History   Chief Complaint Chief Complaint  Patient presents with  . Fall  . Dizziness    HPI Allison Vaughan is a 73 y.o. female.     Patient c/o fall at home prior to arrival. Pt notes 3 falls today, states gets lightheaded when st ands. With last fall, hit head and left hand, and has lacerations to right eyebrow and right forearm. Tetanus unknown. Patient denies loc. Denies preceding chest pain or discomfort. No palpitations or sense of rapid or irregular heartbeat. No headache. Does take eliquis, hx afib. Denies abnormal bruising or bleeding. No hematuria, no melena or rectal bleeding, no vaginal bleeding. Denies cough or uri symptoms. No fever or chills. Hx copd, use mdi prn. +smoker. Pt denies change in speech or vision. No numbness/weaknes. No dysuria or gu c/o. Denies recent change in meds.   The history is provided by the patient and the EMS personnel.  Fall  Pertinent negatives include no chest pain, no abdominal pain, no headaches and no shortness of breath.  Dizziness  Associated symptoms: no chest pain, no diarrhea, no headaches, no nausea, no shortness of breath, no vomiting and no weakness     Past Medical History:  Diagnosis Date  . Accidental drug overdose   . Acute encephalopathy   . Acute on chronic respiratory failure with hypoxia ([place]) 03/01/2017  . Acute renal failure ([place]) 04/16/2014  . AKI (acute kidney injury) ([place]) 04/30/2014  . Altered mental status 06/27/2012  . Anxiety   . Atrial fibrillation ([place])   . CAD (coronary artery disease)   . CAP (community acquired pneumonia) 04/18/2014  . Cardiomyopathy ([place])   . Chronic hypoxemic respiratory failure ([place]) 06/26/2012  . Chronic pain   . COPD exacerbation ([place]) 06/26/2012  . COPD with acute exacerbation ([place]) 03/01/2017  . DEPRESSIVE DISORDER, NOS 03/13/2006   Qualifier: Diagnosis of   By: Damita Dunnings MD, Phillip Heal    . Edema, peripheral   . Essential hypertension 04/30/2014  . Fall at home, initial encounter 03/01/2017  . GASTROESOPHAGEAL REFLUX, NO ESOPHAGITIS 03/13/2006   Qualifier: Diagnosis of  By: Damita Dunnings MD, Phillip Heal    . HYPERCHOLESTEROLEMIA 03/13/2006   Qualifier: Diagnosis of  By: Damita Dunnings MD, Phillip Heal    . Hyperlipidemia   . HYPERTENSION, BENIGN SYSTEMIC 03/13/2006   Qualifier: Diagnosis of  By: Damita Dunnings MD, Phillip Heal    . Hypokalemia 04/30/2014  . Hyponatremia 04/30/2014  . Hypotension 04/30/2014  . Hypoxemia 06/26/2012  . Hypoxia 03/26/2018  . Irritable bowel syndrome 03/13/2006   Qualifier: Diagnosis of  By: Damita Dunnings MD, Phillip Heal    . Lung nodule 04/16/2014  . MENOPAUSAL SYNDROME 03/13/2006   Qualifier: Diagnosis of  By: Damita Dunnings MD, Phillip Heal    . Mood disorder [facility]) 03/13/2006   Qualifier: Diagnosis of  By: Damita Dunnings MD, Phillip Heal    . 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 Nausea 04/30/2014  . Nausea vomiting and diarrhea 04/16/2014  . OSTEOARTHRITIS, MULTI SITES 03/13/2006   Qualifier: Diagnosis of  By: Damita Dunnings MD, Phillip Heal    . Pulmonary nodule 04/30/2014  . Respiratory failure ([place]) 01/22/2017  . TOBACCO DEPENDENCE 03/13/2006   Qualifier: Diagnosis of  By: Damita Dunnings MD, Phillip Heal    . Weight loss     Patient Active Problem List   Diagnosis Date Noted  . Mild renal insufficiency 03/26/2018  . 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 Atrial  fibrillation with RVR ([place]) 03/01/2017  . Acute on chronic respiratory failure with hypoxia ([place]) 03/01/2017  . Fall at home, initial encounter 03/01/2017  . Accidental drug overdose   . Respiratory failure ([place]) 01/22/2017  . AKI (acute kidney injury) ([place]) 04/30/2014  . Pulmonary nodule 04/30/2014  . Essential hypertension 04/30/2014  . Hypokalemia 04/30/2014  . Hyponatremia 04/30/2014  . Hypotension 04/30/2014  . Nausea 04/30/2014  . Lung nodule 04/16/2014  . Chronic hypoxemic respiratory failure ([place]) 06/26/2012  . COPD (chronic obstructive pulmonary  disease) ([place]) 06/26/2012  . HYPERCHOLESTEROLEMIA 03/13/2006  . TOBACCO DEPENDENCE 03/13/2006  . Mood disorder ([place]) 03/13/2006  . HYPERTENSION, BENIGN SYSTEMIC 03/13/2006  . GASTROESOPHAGEAL REFLUX, NO ESOPHAGITIS 03/13/2006  . IRRITABLE BOWEL SYNDROME 03/13/2006  . MENOPAUSAL SYNDROME 03/13/2006  . OSTEOARTHRITIS, MULTI SITES 03/13/2006    Past Surgical History:  Procedure Laterality Date  . EYE SURGERY    . ROTATOR CUFF REPAIR Right 2012     OB History   No obstetric history on file.      Home Medications    Prior to Admission medications   Medication Sig Start Date End Date Taking? Authorizing Provider  albuterol (PROVENTIL HFA;VENTOLIN HFA) 108 (90 BASE) MCG/ACT inhaler Inhale 2 puffs into the lungs every 6 (six) hours as needed for wheezing. 06/09/12   Monika Salk, MD  albuterol (PROVENTIL) (2.5 MG/3ML) 0.083% nebulizer solution Take 3 mLs (2.5 mg total) by nebulization every 2 (two) hours as needed for wheezing. 04/20/14   Rama, Venetia Maxon, MD  apixaban (ELIQUIS) 5 MG TABS tablet Take 1 tablet (5 mg total) by mouth 2 (two) times daily. 03/28/18   Cherene Altes, MD  atorvastatin (LIPITOR) 10 MG tablet Take 1 tablet (10 mg total) by mouth at bedtime. 03/28/18   Cherene Altes, MD  bisacodyl (DULCOLAX) 10 MG suppository Place 10 mg rectally as needed for moderate constipation.    [provider]  diltiazem (CARDIZEM CD) 300 MG 24 hr capsule Take 1 capsule (300 mg total) by mouth daily. 03/28/18 03/28/19  Cherene Altes, MD  feeding supplement, ENSURE ENLIVE, (ENSURE ENLIVE) LIQD Take 237 mLs by mouth 2 (two) times daily between meals. 03/03/17   Aline August, MD  FLUoxetine (PROZAC) 40 MG capsule Take 40 mg by mouth every morning.     [provider]  mometasone-formoterol (DULERA) 200-5 MCG/ACT AERO Inhale 2 puffs into the lungs 2 (two) times daily. 03/28/18   Cherene Altes, MD  ondansetron (ZOFRAN) 4 MG tablet Take 1 tablet (4 mg total) by  mouth every 6 (six) hours. 04/08/18   Blanchie Dessert, MD  Oxycodone HCl 10 MG TABS Take 10 mg by mouth 2 (two) times daily as needed for pain. 03/23/18   [provider]  predniSONE (DELTASONE) 10 MG tablet 2 tablets a day for 3 days, then 1 tablet a day for 3 days, then stop Patient not taking: Reported on 04/08/2018 03/28/18   Cherene Altes, MD  umeclidinium bromide (INCRUSE ELLIPTA) 62.5 MCG/INH AEPB Inhale 1 puff into the lungs daily. 03/29/18   Cherene Altes, MD    Family History Family History  Problem Relation Age of Onset  . CAD Father   . Lung cancer Brother   . Healthy Son   . Healthy Daughter   . Healthy Daughter     Social History Social History   Tobacco Use  . Smoking status: Current Every Day Smoker    Packs/day: 1.00    Types: Cigarettes  .  Smokeless tobacco: Never Used  Substance Use Topics  . Alcohol use: No    Frequency: Never    Comment: not in a long time  . Drug use: No     Allergies   Patient has no known allergies.   Review of Systems Review of Systems  Constitutional: Negative for chills and fever.  HENT: Negative for sore throat.   Eyes: Negative for redness and visual disturbance.  Respiratory: Negative for cough and shortness of breath.   Cardiovascular: Negative for chest pain.  Gastrointestinal: Negative for abdominal pain, diarrhea, nausea and vomiting.  Genitourinary: Negative for dysuria and flank pain.  Musculoskeletal: Negative for back pain and neck pain.  Skin: Positive for wound.  Neurological: Positive for dizziness and light-headedness. Negative for speech difficulty, weakness, numbness and headaches.  Hematological:       Takes eliquis.   Psychiatric/Behavioral: Negative for confusion.     Physical Exam Updated Vital Signs BP 136/64   Pulse 78   Temp 98.4 F (36.9 C) (Oral)   Resp 15   SpO2 97%   Physical Exam Vitals signs and nursing note reviewed.  Constitutional:      Appearance: Normal  appearance. She is well-developed.  HENT:     Head: Atraumatic.     Comments: Contusion, and 1.5 cm lac to right eyebrow.     Nose: Nose normal.     Mouth/Throat:     Mouth: Mucous membranes are moist.  Eyes:     General: No scleral icterus.    Extraocular Movements: Extraocular movements intact.     Conjunctiva/sclera: Conjunctivae normal.     Pupils: Pupils are equal, round, and reactive to light.  Neck:     Musculoskeletal: Normal range of motion and neck supple. No neck rigidity or muscular tenderness.     Vascular: No carotid bruit.     Trachea: No tracheal deviation.  Cardiovascular:     Rate and Rhythm: Normal rate.     Pulses: Normal pulses.     Heart sounds: Normal heart sounds. No murmur. No friction rub. No gallop.   Pulmonary:     Effort: Pulmonary effort is normal. No respiratory distress.     Breath sounds: Normal breath sounds.  Chest:     Chest wall: No tenderness.  Abdominal:     General: Abdomen is flat. Bowel sounds are normal. There is no distension.     Palpations: Abdomen is soft. There is no mass.     Tenderness: There is no abdominal tenderness. There is no guarding or rebound.     Hernia: No hernia is present.  Genitourinary:    Comments: No cva tenderness.  Musculoskeletal:        General: No swelling.     Comments: 2 cm laceration and associated skin tear/abrasion to right forearm. Mild sts and tenderness distal 5th mc on left/skin intact. Otherwise, good rom bil ext without pain or focal bony tenderness. CTLS spine, non tender, aligned, no step off.   Skin:    General: Skin is warm and dry.     Findings: No rash.  Neurological:     General: No focal deficit present.     Mental Status: She is alert and oriented to person, place, and time.     Comments: Alert, speech normal. Motor intact bil, stre 5/5. sens intact bil.   Psychiatric:        Mood and Affect: Mood normal.      ED Treatments / Results  Labs (  all labs ordered are listed, but  only abnormal results are displayed) Results for orders placed or performed during the hospital encounter of 06/08/18  CBC  Result Value Ref Range   WBC 6.5 4.0 - 10.5 K/uL   RBC 3.37 (L) 3.87 - 5.11 MIL/uL   Hemoglobin 9.9 (L) 12.0 - 15.0 g/dL   HCT 30.1 (L) 36.0 - 46.0 %   MCV 89.3 80.0 - 100.0 fL   MCH 29.4 26.0 - 34.0 pg   MCHC 32.9 30.0 - 36.0 g/dL   RDW 13.3 11.5 - 15.5 %   Platelets 308 150 - 400 K/uL   nRBC 0.0 0.0 - 0.2 %  Comprehensive metabolic panel  Result Value Ref Range   Sodium 133 (L) 135 - 145 mmol/L   Potassium 3.9 3.5 - 5.1 mmol/L   Chloride 96 (L) 98 - 111 mmol/L   CO2 25 22 - 32 mmol/L   Glucose, Bld 110 (H) 70 - 99 mg/dL   BUN 8 8 - 23 mg/dL   Creatinine, Ser 1.02 (H) 0.44 - 1.00 mg/dL   Calcium 8.5 (L) 8.9 - 10.3 mg/dL   Total Protein 5.9 (L) 6.5 - 8.1 g/dL   Albumin 3.2 (L) 3.5 - 5.0 g/dL   AST 14 (L) 15 - 41 U/L   ALT 9 0 - 44 U/L   Alkaline Phosphatase 87 38 - 126 U/L   Total Bilirubin 0.2 (L) 0.3 - 1.2 mg/dL   GFR calc non Af Amer 54 (L) >60 mL/min   GFR calc Af Amer >60 >60 mL/min   Anion gap 12 5 - 15   Dg Forearm Right  Result Date: 06/08/2018 CLINICAL DATA:  Fall, abrasions to rt forearm, lac posterior proximal ulna EXAM: RIGHT FOREARM - 2 VIEW COMPARISON:  None. FINDINGS: There is no evidence of fracture or other focal bone lesions. Soft tissues are unremarkable. Advanced degenerative change noted at the first carpometacarpal joint. IMPRESSION: No acute findings. No osseous fracture or dislocation. Electronically Signed   By: Franki Cabot M.D.   On: 06/08/2018 15:20   Dg Hand Complete Left  Result Date: 06/08/2018 CLINICAL DATA:  Fall.  Hand pain EXAM: LEFT HAND - COMPLETE 3+ VIEW COMPARISON:  12/07/2010 FINDINGS: Fracture distal fifth metacarpal with mild displacement and mild angulation. No other acute fracture Chronic fracture distal radius and ulnar styloid. Moderate degenerative change of the base of the thumb. IMPRESSION: Fracture distal  fifth metacarpal Electronically Signed   By: Franchot Gallo M.D.   On: 06/08/2018 15:09    EKG None  Radiology Dg Forearm Right  Result Date: 06/08/2018 CLINICAL DATA:  Fall, abrasions to rt forearm, lac posterior proximal ulna EXAM: RIGHT FOREARM - 2 VIEW COMPARISON:  None. FINDINGS: There is no evidence of fracture or other focal bone lesions. Soft tissues are unremarkable. Advanced degenerative change noted at the first carpometacarpal joint. IMPRESSION: No acute findings. No osseous fracture or dislocation. Electronically Signed   By: Franki Cabot M.D.   On: 06/08/2018 15:20   Dg Hand Complete Left  Result Date: 06/08/2018 CLINICAL DATA:  Fall.  Hand pain EXAM: LEFT HAND - COMPLETE 3+ VIEW COMPARISON:  12/07/2010 FINDINGS: Fracture distal fifth metacarpal with mild displacement and mild angulation. No other acute fracture Chronic fracture distal radius and ulnar styloid. Moderate degenerative change of the base of the thumb. IMPRESSION: Fracture distal fifth metacarpal Electronically Signed   By: Franchot Gallo M.D.   On: 06/08/2018 15:09    Procedures .Marland KitchenLaceration Repair Date/Time:  06/08/2018 3:55 PM Performed by: Lajean Saver, MD Authorized by: Lajean Saver, MD   Consent:    Consent obtained:  Verbal Anesthesia (see MAR for exact dosages):    Anesthesia method:  Local infiltration   Local anesthetic:  Lidocaine 2% WITH epi Laceration details:    Location:  Face   Face location:  R eyebrow   Length (cm):  1.5 Repair type:    Repair type:  Simple Pre-procedure details:    Preparation:  Patient was prepped and draped in usual sterile fashion Treatment:    Area cleansed with:  Betadine   Amount of cleaning:  Standard   Irrigation solution:  Sterile saline   Visualized foreign bodies/material removed: no   Skin repair:    Repair method:  Sutures   Suture size:  6-0   Suture material:  Prolene   Suture technique:  Simple interrupted   Number of sutures:  3  Approximation:    Approximation:  Close Post-procedure details:    Dressing:  Antibiotic ointment   Patient tolerance of procedure:  Tolerated well, no immediate complications .Marland KitchenLaceration Repair Date/Time: 06/08/2018 3:56 PM Performed by: Lajean Saver, MD Authorized by: Lajean Saver, MD   Consent:    Consent obtained:  Verbal Anesthesia (see MAR for exact dosages):    Anesthesia method:  Local infiltration   Local anesthetic:  Lidocaine 2% WITH epi Laceration details:    Location:  Shoulder/arm   Shoulder/arm location:  R lower arm   Length (cm):  2 Repair type:    Repair type:  Simple Pre-procedure details:    Preparation:  Patient was prepped and draped in usual sterile fashion Exploration:    Contaminated: no   Treatment:    Area cleansed with:  Betadine   Amount of cleaning:  Standard   Irrigation solution:  Sterile saline   Irrigation method:  Syringe   Visualized foreign bodies/material removed: no   Skin repair:    Repair method:  Sutures   Suture size:  4-0   Suture material:  Prolene   Suture technique:  Simple interrupted   Number of sutures:  3 Post-procedure details:    Dressing:  Antibiotic ointment   Patient tolerance of procedure:  Tolerated well, no immediate complications   (including critical care time)  Medications Ordered in ED Medications  0.9 %  sodium chloride infusion (10 mL/hr Intravenous New Bag/Given 06/08/18 1523)  lidocaine-EPINEPHrine (XYLOCAINE W/EPI) 2 %-1:200000 (PF) injection 20 mL (20 mLs Infiltration Given 06/08/18 1503)  Tdap (BOOSTRIX) injection 0.5 mL (0.5 mLs Intramuscular Given 06/08/18 1520)     Initial Impression / Assessment and Plan / ED Course  I have reviewed the triage vital signs and the nursing notes.  Pertinent labs & imaging results that were available during my care of the patient were reviewed by me and considered in my medical decision making (see chart for details).  Iv ns. Ecg. Monitor. Stat labs and  imaging.   Reviewed nursing notes and prior charts for additional history.   Labs reviewed by me - Na sl low, 133. ua pending.  Xrays reviewed by me -  Left 5th mc fx.  Ulnar gutter splint.   Right forearm wound and right eyebrow lac sutured.   Tetanus im.  Signed out to Dr Tyrone Nine at 1550 to check all labs and CT when resulted, recheck pt, road test, and dispo appropriately.     Final Clinical Impressions(s) / ED Diagnoses   Final diagnoses:  None  ED Discharge Orders    None       Lajean Saver, MD 06/08/18 [phone]

## 2018-06-08 NOTE — ED Notes (Signed)
Called pt's daughter to transport pt home. Daughter in route

## 2018-06-08 NOTE — ED Notes (Signed)
Pt ambulated well in hallway to bathroom with 1 assist

## 2018-06-19 DIAGNOSIS — J449 Chronic obstructive pulmonary disease, unspecified: Secondary | ICD-10-CM | POA: Diagnosis not present

## 2018-06-19 DIAGNOSIS — I4891 Unspecified atrial fibrillation: Secondary | ICD-10-CM | POA: Diagnosis not present

## 2018-06-19 DIAGNOSIS — F39 Unspecified mood [affective] disorder: Secondary | ICD-10-CM | POA: Diagnosis not present

## 2018-07-02 DIAGNOSIS — J441 Chronic obstructive pulmonary disease with (acute) exacerbation: Secondary | ICD-10-CM | POA: Diagnosis not present

## 2018-07-02 DIAGNOSIS — R062 Wheezing: Secondary | ICD-10-CM | POA: Diagnosis not present

## 2018-07-04 DIAGNOSIS — J449 Chronic obstructive pulmonary disease, unspecified: Secondary | ICD-10-CM | POA: Diagnosis not present

## 2018-07-06 ENCOUNTER — Emergency Department (HOSPITAL_COMMUNITY): Payer: Medicare HMO

## 2018-07-06 ENCOUNTER — Other Ambulatory Visit: Payer: Self-pay

## 2018-07-06 ENCOUNTER — Emergency Department (HOSPITAL_COMMUNITY)
Admission: EM | Admit: 2018-07-06 | Discharge: 2018-07-06 | Disposition: A | Discharge status: Home / Self Care | Payer: Medicare HMO | Attending: Emergency Medicine | Admitting: Emergency Medicine

## 2018-07-06 ENCOUNTER — Encounter (HOSPITAL_COMMUNITY): Payer: Self-pay

## 2018-07-06 DIAGNOSIS — Z5321 Procedure and treatment not carried out due to patient leaving prior to being seen by health care provider: Secondary | ICD-10-CM | POA: Insufficient documentation

## 2018-07-06 DIAGNOSIS — R0602 Shortness of breath: Secondary | ICD-10-CM | POA: Diagnosis not present

## 2018-07-06 DIAGNOSIS — W19XXXA Unspecified fall, initial encounter: Secondary | ICD-10-CM | POA: Diagnosis not present

## 2018-07-06 NOTE — ED Triage Notes (Addendum)
Pt states she has been SOB for approximately one day. Pt also states that she fell today. Pt presents with small abrasion on right arm and above right eye. Pt states 8/10 head pain, and pt states she does take blood thinners.

## 2018-07-06 NOTE — ED Notes (Signed)
Daughter came in and stated "I'm checking my mom out, she should not have sat this long."

## 2018-07-08 ENCOUNTER — Observation Stay (HOSPITAL_COMMUNITY)
Admission: EM | Admit: 2018-07-08 | Discharge: 2018-07-09 | Disposition: A | Discharge status: Home / Self Care | Payer: Medicare HMO | Attending: Internal Medicine | Admitting: Internal Medicine

## 2018-07-08 ENCOUNTER — Emergency Department (HOSPITAL_COMMUNITY): Payer: Medicare HMO

## 2018-07-08 ENCOUNTER — Encounter (HOSPITAL_COMMUNITY): Payer: Self-pay

## 2018-07-08 ENCOUNTER — Other Ambulatory Visit: Payer: Self-pay

## 2018-07-08 DIAGNOSIS — F419 Anxiety disorder, unspecified: Secondary | ICD-10-CM | POA: Diagnosis not present

## 2018-07-08 DIAGNOSIS — I110 Hypertensive heart disease with heart failure: Secondary | ICD-10-CM | POA: Insufficient documentation

## 2018-07-08 DIAGNOSIS — G9341 Metabolic encephalopathy: Secondary | ICD-10-CM | POA: Diagnosis present

## 2018-07-08 DIAGNOSIS — M25531 Pain in right wrist: Secondary | ICD-10-CM | POA: Diagnosis not present

## 2018-07-08 DIAGNOSIS — J9611 Chronic respiratory failure with hypoxia: Secondary | ICD-10-CM | POA: Insufficient documentation

## 2018-07-08 DIAGNOSIS — S199XXA Unspecified injury of neck, initial encounter: Secondary | ICD-10-CM | POA: Diagnosis not present

## 2018-07-08 DIAGNOSIS — F17210 Nicotine dependence, cigarettes, uncomplicated: Secondary | ICD-10-CM | POA: Insufficient documentation

## 2018-07-08 DIAGNOSIS — Z9181 History of falling: Secondary | ICD-10-CM | POA: Insufficient documentation

## 2018-07-08 DIAGNOSIS — F329 Major depressive disorder, single episode, unspecified: Secondary | ICD-10-CM | POA: Insufficient documentation

## 2018-07-08 DIAGNOSIS — Z1159 Encounter for screening for other viral diseases: Secondary | ICD-10-CM | POA: Diagnosis not present

## 2018-07-08 DIAGNOSIS — G8929 Other chronic pain: Secondary | ICD-10-CM | POA: Insufficient documentation

## 2018-07-08 DIAGNOSIS — J440 Chronic obstructive pulmonary disease with acute lower respiratory infection: Secondary | ICD-10-CM | POA: Diagnosis not present

## 2018-07-08 DIAGNOSIS — M1990 Unspecified osteoarthritis, unspecified site: Secondary | ICD-10-CM | POA: Insufficient documentation

## 2018-07-08 DIAGNOSIS — E7800 Pure hypercholesterolemia, unspecified: Secondary | ICD-10-CM | POA: Insufficient documentation

## 2018-07-08 DIAGNOSIS — M79641 Pain in right hand: Secondary | ICD-10-CM | POA: Diagnosis not present

## 2018-07-08 DIAGNOSIS — I480 Paroxysmal atrial fibrillation: Secondary | ICD-10-CM | POA: Insufficient documentation

## 2018-07-08 DIAGNOSIS — I5022 Chronic systolic (congestive) heart failure: Secondary | ICD-10-CM | POA: Diagnosis not present

## 2018-07-08 DIAGNOSIS — N200 Calculus of kidney: Secondary | ICD-10-CM | POA: Diagnosis not present

## 2018-07-08 DIAGNOSIS — I959 Hypotension, unspecified: Secondary | ICD-10-CM | POA: Diagnosis not present

## 2018-07-08 DIAGNOSIS — S0083XA Contusion of other part of head, initial encounter: Secondary | ICD-10-CM | POA: Insufficient documentation

## 2018-07-08 DIAGNOSIS — I429 Cardiomyopathy, unspecified: Secondary | ICD-10-CM | POA: Insufficient documentation

## 2018-07-08 DIAGNOSIS — D649 Anemia, unspecified: Secondary | ICD-10-CM | POA: Diagnosis not present

## 2018-07-08 DIAGNOSIS — J219 Acute bronchiolitis, unspecified: Secondary | ICD-10-CM | POA: Diagnosis present

## 2018-07-08 DIAGNOSIS — J40 Bronchitis, not specified as acute or chronic: Secondary | ICD-10-CM | POA: Diagnosis present

## 2018-07-08 DIAGNOSIS — I700 Atherosclerosis of aorta: Secondary | ICD-10-CM | POA: Diagnosis not present

## 2018-07-08 DIAGNOSIS — E785 Hyperlipidemia, unspecified: Secondary | ICD-10-CM | POA: Diagnosis not present

## 2018-07-08 DIAGNOSIS — S60211A Contusion of right wrist, initial encounter: Secondary | ICD-10-CM | POA: Diagnosis not present

## 2018-07-08 DIAGNOSIS — W19XXXA Unspecified fall, initial encounter: Secondary | ICD-10-CM

## 2018-07-08 DIAGNOSIS — S0990XA Unspecified injury of head, initial encounter: Secondary | ICD-10-CM | POA: Diagnosis not present

## 2018-07-08 DIAGNOSIS — N390 Urinary tract infection, site not specified: Secondary | ICD-10-CM | POA: Diagnosis not present

## 2018-07-08 DIAGNOSIS — I2510 Atherosclerotic heart disease of native coronary artery without angina pectoris: Secondary | ICD-10-CM | POA: Insufficient documentation

## 2018-07-08 DIAGNOSIS — R0602 Shortness of breath: Secondary | ICD-10-CM | POA: Diagnosis not present

## 2018-07-08 DIAGNOSIS — R4182 Altered mental status, unspecified: Secondary | ICD-10-CM | POA: Diagnosis present

## 2018-07-08 DIAGNOSIS — M7989 Other specified soft tissue disorders: Secondary | ICD-10-CM | POA: Diagnosis not present

## 2018-07-08 DIAGNOSIS — Z7901 Long term (current) use of anticoagulants: Secondary | ICD-10-CM | POA: Diagnosis not present

## 2018-07-08 DIAGNOSIS — Z20828 Contact with and (suspected) exposure to other viral communicable diseases: Secondary | ICD-10-CM | POA: Diagnosis not present

## 2018-07-08 DIAGNOSIS — Z79899 Other long term (current) drug therapy: Secondary | ICD-10-CM | POA: Insufficient documentation

## 2018-07-08 DIAGNOSIS — J449 Chronic obstructive pulmonary disease, unspecified: Secondary | ICD-10-CM | POA: Diagnosis present

## 2018-07-08 DIAGNOSIS — R55 Syncope and collapse: Secondary | ICD-10-CM | POA: Diagnosis not present

## 2018-07-08 DIAGNOSIS — R161 Splenomegaly, not elsewhere classified: Secondary | ICD-10-CM | POA: Diagnosis not present

## 2018-07-08 DIAGNOSIS — K8689 Other specified diseases of pancreas: Secondary | ICD-10-CM | POA: Diagnosis not present

## 2018-07-08 DIAGNOSIS — R42 Dizziness and giddiness: Secondary | ICD-10-CM | POA: Diagnosis not present

## 2018-07-08 DIAGNOSIS — R52 Pain, unspecified: Secondary | ICD-10-CM | POA: Diagnosis not present

## 2018-07-08 DIAGNOSIS — S6991XA Unspecified injury of right wrist, hand and finger(s), initial encounter: Secondary | ICD-10-CM | POA: Diagnosis not present

## 2018-07-08 DIAGNOSIS — R0902 Hypoxemia: Secondary | ICD-10-CM | POA: Diagnosis not present

## 2018-07-08 DIAGNOSIS — S0993XA Unspecified injury of face, initial encounter: Secondary | ICD-10-CM | POA: Diagnosis not present

## 2018-07-08 DIAGNOSIS — R7989 Other specified abnormal findings of blood chemistry: Secondary | ICD-10-CM | POA: Diagnosis not present

## 2018-07-08 DIAGNOSIS — R8271 Bacteriuria: Secondary | ICD-10-CM

## 2018-07-08 LAB — URINALYSIS, ROUTINE W REFLEX MICROSCOPIC
Bacteria, UA: NONE SEEN
Bilirubin Urine: NEGATIVE
Glucose, UA: NEGATIVE mg/dL
Hgb urine dipstick: NEGATIVE
Ketones, ur: NEGATIVE mg/dL
Nitrite: NEGATIVE
Protein, ur: NEGATIVE mg/dL
Specific Gravity, Urine: 1.003 — ABNORMAL LOW (ref 1.005–1.030)
pH: 6 (ref 5.0–8.0)

## 2018-07-08 MED ORDER — SODIUM CHLORIDE 0.9 % IV BOLUS
1000.0000 mL | Freq: Once | INTRAVENOUS | Status: AC
  Administered 2018-07-08: 1000 mL via INTRAVENOUS

## 2018-07-08 NOTE — ED Triage Notes (Signed)
Per EMS, Pt is coming from home. Pt called EMS for low O2 saturation, dizziness for 2x days, and pain in the right wrist. Pt A&O 4x, however EMS stated that her responses were delayed. Pt does take blood thinner. Pt had a witnessed fall 2x days ago. HTX of afib, and COPD.

## 2018-07-08 NOTE — ED Provider Notes (Signed)
[facility] DEPT Provider Note   CSN: [id number] Arrival date & time: 07/08/18  2006     History   Chief Complaint Chief Complaint  Patient presents with   Shortness of Breath   Wrist Pain    HPI Allison Vaughan is a 73 y.o. female.     Level 5 caveat for altered mental status.  Patient from home after apparent fall.  She apparently fell 2 days ago by report injuring her right face and her right hand and wrist.  Patient is somnolent and arousable to voice but appears to be somewhat confused.  She does states she fell 2 days ago but is not certain what happened to make her fall.  States she has issues with dizziness at baseline.  Complains of pain to her back, right face and right wrist.  Denies chest pain or shortness of breath.  Per EMS she had low O2 saturations but it is unknown what the numbers were.  She has a chronic cough which is unchanged.  Does not wear oxygen at home.  Does take Eliquis for history of atrial fibrillation.  Also has COPD.  Discussed with patient's daughter [name] by phone.  She was not aware of patient, the hospital today or any fall.   Discussed with patient's daughter [name] by phone.  3 3 6-6 or 1-5 106.  She states the patient had a fall 2 days ago injuring her right thigh and right wrist.  She had issues with dizziness as well as for several weeks.  [name] is concerned for possible low sodium or low potassium.  No fevers at home.  The history is provided by the patient, the EMS personnel and a relative.  Shortness of Breath Wrist Pain Associated symptoms include shortness of breath.    Past Medical History:  Diagnosis Date   Accidental drug overdose    Acute encephalopathy    Acute on chronic respiratory failure with hypoxia ([place]) 03/01/2017   Acute renal failure ([place]) 04/16/2014   AKI (acute kidney injury) ([place]) 04/30/2014   Altered mental status 06/27/2012   Anxiety    Atrial fibrillation ([place])    CAD  (coronary artery disease)    CAP (community acquired pneumonia) 04/18/2014   Cardiomyopathy ([place])    Chronic hypoxemic respiratory failure ([place]) 06/26/2012   Chronic pain    COPD exacerbation ([place]) 06/26/2012   COPD with acute exacerbation ([place]) 03/01/2017   DEPRESSIVE DISORDER, NOS 03/13/2006   Qualifier: Diagnosis of  By: Damita Dunnings MD, Phillip Heal     Edema, peripheral    Essential hypertension 04/30/2014   Fall at home, initial encounter 03/01/2017   GASTROESOPHAGEAL REFLUX, NO ESOPHAGITIS 03/13/2006   Qualifier: Diagnosis of  By: Damita Dunnings MD, Burt Ek 03/13/2006   Qualifier: Diagnosis of  By: Damita Dunnings MD, Phillip Heal     Hyperlipidemia    HYPERTENSION, BENIGN SYSTEMIC 03/13/2006   Qualifier: Diagnosis of  By: Damita Dunnings MD, [place]     Hypokalemia 04/30/2014   Hyponatremia 04/30/2014   Hypotension 04/30/2014   Hypoxemia 06/26/2012   Hypoxia 03/26/2018   Irritable bowel syndrome 03/13/2006   Qualifier: Diagnosis of  By: Damita Dunnings MD, Graham     Lung nodule 04/16/2014   MENOPAUSAL SYNDROME 03/13/2006   Qualifier: Diagnosis of  By: Damita Dunnings MD, Phillip Heal     Mood disorder ([place]) 03/13/2006   Qualifier: Diagnosis of  By: Damita Dunnings MD, [place]     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[place])    Nausea 04/30/2014  Nausea vomiting and diarrhea 04/16/2014   OSTEOARTHRITIS, MULTI SITES 03/13/2006   Qualifier: Diagnosis of  By: Damita Dunnings MD, Phillip Heal     Pulmonary nodule 04/30/2014   Respiratory failure ([place]) 01/22/2017   TOBACCO DEPENDENCE 03/13/2006   Qualifier: Diagnosis of  By: Damita Dunnings MD, Phillip Heal     Weight loss     Patient Active Problem List   Diagnosis Date Noted   Mild renal insufficiency 03/26/2018   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H[facility])    Atrial fibrillation with RVR ([place]) 03/01/2017   Acute on chronic respiratory failure with hypoxia ([place]) 03/01/2017   Fall at home, initial encounter 03/01/2017   Accidental drug overdose    Respiratory failure ([place]) 01/22/2017   AKI (acute  kidney injury) ([place]) 04/30/2014   Pulmonary nodule 04/30/2014   Essential hypertension 04/30/2014   Hypokalemia 04/30/2014   Hyponatremia 04/30/2014   Hypotension 04/30/2014   Nausea 04/30/2014   Lung nodule 04/16/2014   Chronic hypoxemic respiratory failure ([place]) 06/26/2012   COPD (chronic obstructive pulmonary disease) ([place]) 06/26/2012   HYPERCHOLESTEROLEMIA 03/13/2006   TOBACCO DEPENDENCE 03/13/2006   Mood disorder ([place]) 03/13/2006   HYPERTENSION, BENIGN SYSTEMIC 03/13/2006   GASTROESOPHAGEAL REFLUX, NO ESOPHAGITIS 03/13/2006   IRRITABLE BOWEL SYNDROME 03/13/2006   MENOPAUSAL SYNDROME 03/13/2006   OSTEOARTHRITIS, MULTI SITES 03/13/2006    Past Surgical History:  Procedure Laterality Date   EYE SURGERY     ROTATOR CUFF REPAIR Right 2012     OB History   No obstetric history on file.      Home Medications    Prior to Admission medications   Medication Sig Start Date End Date Taking? Authorizing Provider  albuterol (PROVENTIL HFA;VENTOLIN HFA) 108 (90 BASE) MCG/ACT inhaler Inhale 2 puffs into the lungs every 6 (six) hours as needed for wheezing. 06/09/12   Monika Salk, MD  albuterol (PROVENTIL) (2.5 MG/3ML) 0.083% nebulizer solution Take 3 mLs (2.5 mg total) by nebulization every 2 (two) hours as needed for wheezing. 04/20/14   Rama, Venetia Maxon, MD  apixaban (ELIQUIS) 5 MG TABS tablet Take 1 tablet (5 mg total) by mouth 2 (two) times daily. 03/28/18   Cherene Altes, MD  atorvastatin (LIPITOR) 10 MG tablet Take 1 tablet (10 mg total) by mouth at bedtime. 03/28/18   Cherene Altes, MD  bisacodyl (DULCOLAX) 10 MG suppository Place 10 mg rectally as needed for moderate constipation.    [provider]  diltiazem (CARDIZEM CD) 300 MG 24 hr capsule Take 1 capsule (300 mg total) by mouth daily. 03/28/18 03/28/19  Cherene Altes, MD  feeding supplement, ENSURE ENLIVE, (ENSURE ENLIVE) LIQD Take 237 mLs by mouth 2 (two) times daily between meals.  03/03/17   Aline August, MD  FLUoxetine (PROZAC) 40 MG capsule Take 40 mg by mouth every morning.     [provider]  mometasone-formoterol (DULERA) 200-5 MCG/ACT AERO Inhale 2 puffs into the lungs 2 (two) times daily. 03/28/18   Cherene Altes, MD  ondansetron (ZOFRAN) 4 MG tablet Take 1 tablet (4 mg total) by mouth every 6 (six) hours. 04/08/18   Blanchie Dessert, MD  Oxycodone HCl 10 MG TABS Take 10 mg by mouth 2 (two) times daily as needed for pain. 03/23/18   [provider]  predniSONE (DELTASONE) 10 MG tablet 2 tablets a day for 3 days, then 1 tablet a day for 3 days, then stop Patient not taking: Reported on 04/08/2018 03/28/18   Cherene Altes, MD  umeclidinium bromide (INCRUSE ELLIPTA) 62.5  MCG/INH AEPB Inhale 1 puff into the lungs daily. 03/29/18   Cherene Altes, MD    Family History Family History  Problem Relation Age of Onset   CAD Father    Lung cancer Brother    Healthy Son    Healthy Daughter    Healthy Daughter     Social History Social History   Tobacco Use   Smoking status: Current Every Day Smoker    Packs/day: 1.00    Types: Cigarettes   Smokeless tobacco: Never Used   Tobacco comment: states she is trying to quit  Substance Use Topics   Alcohol use: No    Frequency: Never    Comment: not in a long time   Drug use: No     Allergies   Patient has no known allergies.   Review of Systems Review of Systems  Unable to perform ROS: Mental status change  Respiratory: Positive for shortness of breath.      Physical Exam Updated Vital Signs BP (!) 161/70 (BP Location: Left Arm)    Pulse 83    Temp 98.6 F (37 C) (Oral)    Resp 16    Ht 5\' 1"  (1.549 m)    Wt 44.5 kg    SpO2 95% Comment: Simultaneous filing. User may not have seen previous data.   BMI 18.52 kg/m   Physical Exam Vitals signs and nursing note reviewed.  Constitutional:      General: She is not in acute distress.    Appearance: She is  well-developed.     Comments: Somnolent, arouses to voice  HENT:     Head: Normocephalic and atraumatic.     Mouth/Throat:     Mouth: Mucous membranes are dry.     Pharynx: No oropharyngeal exudate.  Eyes:     Conjunctiva/sclera: Conjunctivae normal.     Pupils: Pupils are equal, round, and reactive to light.     Comments: Right periorbital ecchymosis.  Extraocular muscle intact.  Neck:     Musculoskeletal: Normal range of motion and neck supple.     Comments: No meningismus. Cardiovascular:     Rate and Rhythm: Normal rate and regular rhythm.     Heart sounds: Normal heart sounds. No murmur.  Pulmonary:     Effort: Pulmonary effort is normal. No respiratory distress.     Breath sounds: Normal breath sounds.  Abdominal:     Palpations: Abdomen is soft.     Tenderness: There is no abdominal tenderness. There is no guarding or rebound.  Musculoskeletal: Normal range of motion.        General: Swelling, tenderness and signs of injury present.     Comments: Swelling and erythema to the right dorsal hand with reduced range of motion of the fingers.  Range of motion of wrist without pain.  Intact radial pulse.  No T or L-spine pain.  Full range of motion of hips without difficulty.  Skin:    General: Skin is warm.     Capillary Refill: Capillary refill takes less than 2 seconds.  Neurological:     General: No focal deficit present.     Mental Status: She is alert.     Cranial Nerves: No cranial nerve deficit.     Motor: No abnormal muscle tone.     Coordination: Coordination normal.     Comments: Oriented to person and place.  Moves all extremities.  She seems confused.  Psychiatric:        Behavior: Behavior  normal.      ED Treatments / Results  Labs (all labs ordered are listed, but only abnormal results are displayed) Labs Reviewed  BASIC METABOLIC PANEL - Abnormal; Notable for the following components:      Result Value   Glucose, Bld 122 (*)    Calcium 8.6 (*)     GFR calc non Af Amer 59 (*)    All other components within normal limits  CBC - Abnormal; Notable for the following components:   RBC 3.57 (*)    Hemoglobin 10.5 (*)    HCT 32.3 (*)    All other components within normal limits  TROPONIN I (HIGH SENSITIVITY) - Abnormal; Notable for the following components:   Troponin I (High Sensitivity) 23.1 (*)    All other components within normal limits  D-DIMER, QUANTITATIVE (NOT AT [facility]) - Abnormal; Notable for the following components:   D-Dimer, Quant 1.33 (*)    All other components within normal limits  BRAIN NATRIURETIC PEPTIDE - Abnormal; Notable for the following components:   B Natriuretic Peptide 147.3 (*)    All other components within normal limits  URINALYSIS, ROUTINE W REFLEX MICROSCOPIC - Abnormal; Notable for the following components:   Color, Urine STRAW (*)    Specific Gravity, Urine 1.003 (*)    Leukocytes,Ua SMALL (*)    All other components within normal limits  BLOOD GAS, VENOUS - Abnormal; Notable for the following components:   pCO2, Ven 68.6 (*)    pO2, Ven 45.8 (*)    Bicarbonate 29.6 (*)    All other components within normal limits  HEPATIC FUNCTION PANEL - Abnormal; Notable for the following components:   Total Protein 6.4 (*)    Albumin 3.4 (*)    AST 13 (*)    All other components within normal limits  SARS CORONAVIRUS 2 (HOSPITAL ORDER, PERFORMED IN [facility])  CULTURE, BLOOD (ROUTINE X 2)  CULTURE, BLOOD (ROUTINE X 2)  URINE CULTURE  LACTIC ACID, PLASMA  TROPONIN I (HIGH SENSITIVITY)  LACTIC ACID, PLASMA    EKG EKG Interpretation  Date/Time:  Wednesday July 08 2018 23:27:17 EDT Ventricular Rate:  86 PR Interval:    QRS Duration: 169 QT Interval:  422 QTC Calculation: 484 R Axis:   54 Text Interpretation:  Sinus rhythm Atrial premature complexes Probable left ventricular hypertrophy No significant change was found Confirmed by Ezequiel Essex [phone]) on 07/09/2018 12:15:16  AM   Radiology Dg Wrist Complete Right  Result Date: 07/08/2018 CLINICAL DATA:  Pain following fall EXAM: RIGHT WRIST - COMPLETE 3+ VIEW COMPARISON:  None. FINDINGS: Frontal, oblique, lateral, and ulnar deviation scaphoid images were obtained. There is no acute fracture or dislocation. There is marked osteoarthritic change in the first carpal-metacarpal and scaphotrapezial joints. There is heterotopic bone lateral to the trapezium bone. There is remodeling of the trapezium bone. There is milder osteoarthritic change in the radiocarpal joint. There is osteoarthritic change in all MCP joints. No bony destruction or erosion. IMPRESSION: Multifocal arthropathy, most marked in the first carpal-metacarpal and scaphotrapezial joints. There is heterotopic bone which may represent a degree of myositis ossificans lateral to the trapezium bone. No acute fracture or dislocation. Electronically Signed   By: Lowella Grip III M.D.   On: 07/08/2018 21:15   Ct Head Wo Contrast  Result Date: 07/09/2018 CLINICAL DATA:  73 year old female with head trauma. EXAM: CT HEAD WITHOUT CONTRAST CT MAXILLOFACIAL WITHOUT CONTRAST CT CERVICAL SPINE WITHOUT CONTRAST TECHNIQUE: Multidetector CT imaging of  the head, cervical spine, and maxillofacial structures were performed using the standard protocol without intravenous contrast. Multiplanar CT image reconstructions of the cervical spine and maxillofacial structures were also generated. COMPARISON:  Head CT dated 06/08/2018 FINDINGS: CT HEAD FINDINGS Brain: The ventricles and sulci appropriate size for patient's age. Confluent periventricular and centrum semiovale white matter hypodensities most consistent with chronic microvascular ischemic changes or chronic white matter disease. There is no acute intracranial hemorrhage. No mass effect or midline shift. No extra-axial fluid collection. Vascular: No hyperdense vessel or unexpected calcification. Skull: No acute calvarial  pathology. Scattered small calvarial lucencies similar to prior CT. Other: None CT MAXILLOFACIAL FINDINGS Osseous: No acute fracture. No mandibular subluxation. Degenerative changes of the TMJs bilaterally. Irregularity of the right nasal bone without overlying soft tissue swelling. This is similar to prior CT and consistent with an old fracture. Orbits: The globes and retro-orbital fat are preserved. Left cataract surgery. Sinuses: Complete opacification of the right maxillary sinus. The remainder of the visualized paranasal sinuses and mastoid air cells are clear. Soft tissues: Negative. CT CERVICAL SPINE FINDINGS Alignment: No acute subluxation. Grade 1 C7-T1 anterolisthesis. Skull base and vertebrae: No acute fracture. Osteopenia. Small sclerotic focus at T2, indeterminate, possibly a bone island. Soft tissues and spinal canal: No prevertebral fluid or swelling. No visible canal hematoma. Disc levels:  Multilevel degenerative changes. Upper chest: Atherosclerotic calcification of the aortic arch. Bilateral carotid bulb calcified plaques. Other: None IMPRESSION: 1. No acute intracranial hemorrhage. 2. No acute/traumatic cervical spine pathology. 3. No acute facial bone fractures. Electronically Signed   By: Anner Crete M.D.   On: 07/09/2018 02:18   Ct Angio Chest Pe W And/or Wo Contrast  Result Date: 07/09/2018 CLINICAL DATA:  Positive D-dimer with PE suspected. History of AFib and COPD. EXAM: CT ANGIOGRAPHY CHEST CT ABDOMEN AND PELVIS WITH CONTRAST TECHNIQUE: Multidetector CT imaging of the chest was performed using the standard protocol during bolus administration of intravenous contrast. Multiplanar CT image reconstructions and MIPs were obtained to evaluate the vascular anatomy. Multidetector CT imaging of the abdomen and pelvis was performed using the standard protocol during bolus administration of intravenous contrast. CONTRAST:  37mL OMNIPAQUE IOHEXOL 350 MG/ML SOLN COMPARISON:  CT chest dated  May 01, 2014. FINDINGS: CTA CHEST FINDINGS Cardiovascular: Satisfactory opacification of the pulmonary arteries to the segmental level. No evidence of pulmonary embolism. Normal heart size. No pericardial effusion. Aortic calcifications are noted. Coronary artery calcifications are noted. Mediastinum/Nodes: No enlarged mediastinal, hilar, or axillary lymph nodes. Thyroid gland, trachea, and esophagus demonstrate no significant findings. Lungs/Pleura: There are few small scattered pulmonary nodules bilaterally. These appear stable from prior studies. There are a few tree-in-bud opacities in the right upper lobe. There is some bronchial wall thickening and mucus plugging at the lung bases bilaterally. There is no large pleural effusion. No pneumothorax. There is atelectasis at the right lung base. Musculoskeletal: No chest wall abnormality. No acute or significant osseous findings. Review of the MIP images confirms the above findings. CT ABDOMEN and PELVIS FINDINGS Hepatobiliary: No focal liver abnormality is seen. No gallstones, gallbladder wall thickening, or biliary dilatation. Pancreas: There is significant dilatation of the distal pancreatic duct which appears new since prior CT in 2016. There is an ill-defined soft tissue density in the pancreatic body. The distal pancreatic duct is unremarkable. The common bile duct is not significantly dilated. Spleen: The spleen is significantly enlarged measuring approximately 12.5 cm craniocaudad. Adrenals/Urinary Tract: There are multiple nonobstructing stones involving both kidneys. There  is no hydronephrosis. The adrenal glands are unremarkable. The bladder is moderately distended. Stomach/Bowel: There is a moderate amount of stool in the colon. There is some dilatation of the gastric antrum and pylorus of unknown clinical significance. There is no evidence of a small-bowel obstruction. The appendix is not reliably identified on this exam. Vascular/Lymphatic: Aortic  atherosclerosis. No enlarged abdominal or pelvic lymph nodes. The proximal right SFA is occluded. The proximal left SFA is likely also occluded but is not well evaluated on this exam. Reproductive: Status post hysterectomy. No adnexal masses. Other: There is a small amount of free fluid in the patient's pelvis of unknown clinical significance. Musculoskeletal: There are advanced degenerative changes of lumbar spine. Review of the MIP images confirms the above findings. IMPRESSION: 1. No PE identified. 2. Scattered tree-in-bud opacities bilaterally with areas of bronchial wall thickening and mucus plugging is suspicious for infectious bronchiolitis. There is a small amount of atelectasis at the right lung base. 3. No acute intra-abdominal abnormality. 4. Dilatation of the distal pancreatic duct with an abrupt cutoff at the level of the pancreatic body. This is new since the patient's CT in 2016. An underlying mass cannot be excluded in this location. Follow-up with a nonemergent outpatient contrast enhanced MRI is recommended for further evaluation of this finding. 5. Splenomegaly. 6.  Aortic Atherosclerosis (ICD10-I70.0). 7. There is a small amount of free fluid in the patient's pelvis with no identifiable cause. The appendix is not reliably identified on this exam. There is a moderate amount of stool throughout the colon. There is some distention of the gastric antrum of unknown clinical significance. Electronically Signed   By: Constance Holster M.D.   On: 07/09/2018 02:34   Ct Cervical Spine Wo Contrast  Result Date: 07/09/2018 CLINICAL DATA:  73 year old female with head trauma. EXAM: CT HEAD WITHOUT CONTRAST CT MAXILLOFACIAL WITHOUT CONTRAST CT CERVICAL SPINE WITHOUT CONTRAST TECHNIQUE: Multidetector CT imaging of the head, cervical spine, and maxillofacial structures were performed using the standard protocol without intravenous contrast. Multiplanar CT image reconstructions of the cervical spine and  maxillofacial structures were also generated. COMPARISON:  Head CT dated 06/08/2018 FINDINGS: CT HEAD FINDINGS Brain: The ventricles and sulci appropriate size for patient's age. Confluent periventricular and centrum semiovale white matter hypodensities most consistent with chronic microvascular ischemic changes or chronic white matter disease. There is no acute intracranial hemorrhage. No mass effect or midline shift. No extra-axial fluid collection. Vascular: No hyperdense vessel or unexpected calcification. Skull: No acute calvarial pathology. Scattered small calvarial lucencies similar to prior CT. Other: None CT MAXILLOFACIAL FINDINGS Osseous: No acute fracture. No mandibular subluxation. Degenerative changes of the TMJs bilaterally. Irregularity of the right nasal bone without overlying soft tissue swelling. This is similar to prior CT and consistent with an old fracture. Orbits: The globes and retro-orbital fat are preserved. Left cataract surgery. Sinuses: Complete opacification of the right maxillary sinus. The remainder of the visualized paranasal sinuses and mastoid air cells are clear. Soft tissues: Negative. CT CERVICAL SPINE FINDINGS Alignment: No acute subluxation. Grade 1 C7-T1 anterolisthesis. Skull base and vertebrae: No acute fracture. Osteopenia. Small sclerotic focus at T2, indeterminate, possibly a bone island. Soft tissues and spinal canal: No prevertebral fluid or swelling. No visible canal hematoma. Disc levels:  Multilevel degenerative changes. Upper chest: Atherosclerotic calcification of the aortic arch. Bilateral carotid bulb calcified plaques. Other: None IMPRESSION: 1. No acute intracranial hemorrhage. 2. No acute/traumatic cervical spine pathology. 3. No acute facial bone fractures. Electronically Signed  By: Anner Crete M.D.   On: 07/09/2018 02:18   Ct Abdomen Pelvis W Contrast  Result Date: 07/09/2018 CLINICAL DATA:  Positive D-dimer with PE suspected. History of AFib and  COPD. EXAM: CT ANGIOGRAPHY CHEST CT ABDOMEN AND PELVIS WITH CONTRAST TECHNIQUE: Multidetector CT imaging of the chest was performed using the standard protocol during bolus administration of intravenous contrast. Multiplanar CT image reconstructions and MIPs were obtained to evaluate the vascular anatomy. Multidetector CT imaging of the abdomen and pelvis was performed using the standard protocol during bolus administration of intravenous contrast. CONTRAST:  49mL OMNIPAQUE IOHEXOL 350 MG/ML SOLN COMPARISON:  CT chest dated May 01, 2014. FINDINGS: CTA CHEST FINDINGS Cardiovascular: Satisfactory opacification of the pulmonary arteries to the segmental level. No evidence of pulmonary embolism. Normal heart size. No pericardial effusion. Aortic calcifications are noted. Coronary artery calcifications are noted. Mediastinum/Nodes: No enlarged mediastinal, hilar, or axillary lymph nodes. Thyroid gland, trachea, and esophagus demonstrate no significant findings. Lungs/Pleura: There are few small scattered pulmonary nodules bilaterally. These appear stable from prior studies. There are a few tree-in-bud opacities in the right upper lobe. There is some bronchial wall thickening and mucus plugging at the lung bases bilaterally. There is no large pleural effusion. No pneumothorax. There is atelectasis at the right lung base. Musculoskeletal: No chest wall abnormality. No acute or significant osseous findings. Review of the MIP images confirms the above findings. CT ABDOMEN and PELVIS FINDINGS Hepatobiliary: No focal liver abnormality is seen. No gallstones, gallbladder wall thickening, or biliary dilatation. Pancreas: There is significant dilatation of the distal pancreatic duct which appears new since prior CT in 2016. There is an ill-defined soft tissue density in the pancreatic body. The distal pancreatic duct is unremarkable. The common bile duct is not significantly dilated. Spleen: The spleen is significantly enlarged  measuring approximately 12.5 cm craniocaudad. Adrenals/Urinary Tract: There are multiple nonobstructing stones involving both kidneys. There is no hydronephrosis. The adrenal glands are unremarkable. The bladder is moderately distended. Stomach/Bowel: There is a moderate amount of stool in the colon. There is some dilatation of the gastric antrum and pylorus of unknown clinical significance. There is no evidence of a small-bowel obstruction. The appendix is not reliably identified on this exam. Vascular/Lymphatic: Aortic atherosclerosis. No enlarged abdominal or pelvic lymph nodes. The proximal right SFA is occluded. The proximal left SFA is likely also occluded but is not well evaluated on this exam. Reproductive: Status post hysterectomy. No adnexal masses. Other: There is a small amount of free fluid in the patient's pelvis of unknown clinical significance. Musculoskeletal: There are advanced degenerative changes of lumbar spine. Review of the MIP images confirms the above findings. IMPRESSION: 1. No PE identified. 2. Scattered tree-in-bud opacities bilaterally with areas of bronchial wall thickening and mucus plugging is suspicious for infectious bronchiolitis. There is a small amount of atelectasis at the right lung base. 3. No acute intra-abdominal abnormality. 4. Dilatation of the distal pancreatic duct with an abrupt cutoff at the level of the pancreatic body. This is new since the patient's CT in 2016. An underlying mass cannot be excluded in this location. Follow-up with a nonemergent outpatient contrast enhanced MRI is recommended for further evaluation of this finding. 5. Splenomegaly. 6.  Aortic Atherosclerosis (ICD10-I70.0). 7. There is a small amount of free fluid in the patient's pelvis with no identifiable cause. The appendix is not reliably identified on this exam. There is a moderate amount of stool throughout the colon. There is some distention of the gastric  antrum of unknown clinical  significance. Electronically Signed   By: Constance Holster M.D.   On: 07/09/2018 02:34   Dg Hand Complete Right  Result Date: 07/09/2018 CLINICAL DATA:  Pain status post fall EXAM: RIGHT HAND - COMPLETE 3+ VIEW COMPARISON:  None. FINDINGS: There is no acute displaced fracture or dislocation. There are advanced degenerative changes of the first carpometacarpal joint as well as the scaphoid trapezial joint there are degenerative changes throughout the interphalangeal joints as well as the metacarpophalangeal joints, greatest at the third digit. There is soft tissue swelling about the hand. There is nonspecific flexion at the of the metacarpophalangeal joints. IMPRESSION: 1. No acute displaced fracture or dislocation. 2. Degenerative changes as above. 3. Soft tissue swelling about the hand. Electronically Signed   By: Constance Holster M.D.   On: 07/09/2018 00:15   Ct Maxillofacial Wo Contrast  Result Date: 07/09/2018 CLINICAL DATA:  73 year old female with head trauma. EXAM: CT HEAD WITHOUT CONTRAST CT MAXILLOFACIAL WITHOUT CONTRAST CT CERVICAL SPINE WITHOUT CONTRAST TECHNIQUE: Multidetector CT imaging of the head, cervical spine, and maxillofacial structures were performed using the standard protocol without intravenous contrast. Multiplanar CT image reconstructions of the cervical spine and maxillofacial structures were also generated. COMPARISON:  Head CT dated 06/08/2018 FINDINGS: CT HEAD FINDINGS Brain: The ventricles and sulci appropriate size for patient's age. Confluent periventricular and centrum semiovale white matter hypodensities most consistent with chronic microvascular ischemic changes or chronic white matter disease. There is no acute intracranial hemorrhage. No mass effect or midline shift. No extra-axial fluid collection. Vascular: No hyperdense vessel or unexpected calcification. Skull: No acute calvarial pathology. Scattered small calvarial lucencies similar to prior CT. Other: None CT  MAXILLOFACIAL FINDINGS Osseous: No acute fracture. No mandibular subluxation. Degenerative changes of the TMJs bilaterally. Irregularity of the right nasal bone without overlying soft tissue swelling. This is similar to prior CT and consistent with an old fracture. Orbits: The globes and retro-orbital fat are preserved. Left cataract surgery. Sinuses: Complete opacification of the right maxillary sinus. The remainder of the visualized paranasal sinuses and mastoid air cells are clear. Soft tissues: Negative. CT CERVICAL SPINE FINDINGS Alignment: No acute subluxation. Grade 1 C7-T1 anterolisthesis. Skull base and vertebrae: No acute fracture. Osteopenia. Small sclerotic focus at T2, indeterminate, possibly a bone island. Soft tissues and spinal canal: No prevertebral fluid or swelling. No visible canal hematoma. Disc levels:  Multilevel degenerative changes. Upper chest: Atherosclerotic calcification of the aortic arch. Bilateral carotid bulb calcified plaques. Other: None IMPRESSION: 1. No acute intracranial hemorrhage. 2. No acute/traumatic cervical spine pathology. 3. No acute facial bone fractures. Electronically Signed   By: Anner Crete M.D.   On: 07/09/2018 02:18    Procedures Procedures (including critical care time)  Medications Ordered in ED Medications  sodium chloride 0.9 % bolus 1,000 mL (has no administration in time range)     Initial Impression / Assessment and Plan / ED Course  I have reviewed the triage vital signs and the nursing notes.  Pertinent labs & imaging results that were available during my care of the patient were reviewed by me and considered in my medical decision making (see chart for details).       Patient from home after apparent fall 2 days ago.  Has bruising to her face and right wrist. She is confused and has a hard time giving a history.  CT head and C-spine are negative.  X-rays of hand and wrist are negative and does show soft tissue  swelling.   Patient is afebrile.  Chest x-ray is negative.  Labs show mild CO2 retention.  Troponin minimally elevated at 23.  Patient has no chest pain or shortness of breath currently.  CT will be obtained to rule out pulmonary embolism.  Imaging shows no pulmonary embolism but does show tree-in-bud opacities suspicious for bronchiolitis/bronchitis.  Coronavirus testing will be obtained and patient started on IV Zithromax. CT imaging also shows pancreatic duct dilation as well as trace free fluid of uncertain etiology.  Uncertain if this free fluid is posttraumatic in nature.  Patient has no abdominal pain.  Her vitals are stable and hemoglobin is stable.  Discussed with Dr. gross of general surgery.  Patient does take Eliquis.  He feels this is unlikely to be posttraumatic given her stability and the fact that her fall occurred greater than 24 hours ago.  States that Eliquis could be held if there is high concern but it seems unlikely that patient has posttraumatic free fluid with no abdominal pain and stable vital signs.  Patient remains intermittently confused.  Her traumatic imaging is essentially negative from her fall.  Will treat with antibiotics for suspected bronchitis while coronavirus is pending.  Given her recurrent falls and dizziness, will plan observation admission.  Discussed with Dr. Maudie Mercury.  BP (!) 146/63 (BP Location: Left Arm)    Pulse 62    Temp 98.2 F (36.8 C) (Oral)    Resp 18    Ht 5\' 1"  (1.549 m)    Wt 44.7 kg    SpO2 95%    BMI 18.62 kg/m    Final Clinical Impressions(s) / ED Diagnoses   Final diagnoses:  Fall, initial encounter  Contusion of face, initial encounter  Contusion of right wrist, initial encounter    ED Discharge Orders    None       Kalina Morabito, Annie Main, MD 07/09/18 [phone]

## 2018-07-08 NOTE — ED Notes (Signed)
Bed: AK35 Expected date:  Expected time:  Means of arrival:  Comments: 63F low sats

## 2018-07-08 NOTE — ED Notes (Signed)
Pt would not allow speaker to perform the Covid swab. It was explained to the pt the method the test was performed, and reason it was needed, and what could happen if the test was not performed. The pest still insisted that she would not allow [place] to perform the Covid swab.

## 2018-07-09 ENCOUNTER — Encounter (HOSPITAL_COMMUNITY): Payer: Self-pay

## 2018-07-09 ENCOUNTER — Observation Stay (HOSPITAL_BASED_OUTPATIENT_CLINIC_OR_DEPARTMENT_OTHER): Payer: Medicare HMO

## 2018-07-09 ENCOUNTER — Emergency Department (HOSPITAL_COMMUNITY): Payer: Medicare HMO

## 2018-07-09 ENCOUNTER — Observation Stay (HOSPITAL_COMMUNITY): Payer: Medicare HMO

## 2018-07-09 DIAGNOSIS — D649 Anemia, unspecified: Secondary | ICD-10-CM | POA: Diagnosis present

## 2018-07-09 DIAGNOSIS — S0990XA Unspecified injury of head, initial encounter: Secondary | ICD-10-CM | POA: Diagnosis not present

## 2018-07-09 DIAGNOSIS — R8271 Bacteriuria: Secondary | ICD-10-CM

## 2018-07-09 DIAGNOSIS — G9341 Metabolic encephalopathy: Secondary | ICD-10-CM | POA: Diagnosis present

## 2018-07-09 DIAGNOSIS — S199XXA Unspecified injury of neck, initial encounter: Secondary | ICD-10-CM | POA: Diagnosis not present

## 2018-07-09 DIAGNOSIS — I371 Nonrheumatic pulmonary valve insufficiency: Secondary | ICD-10-CM | POA: Diagnosis not present

## 2018-07-09 DIAGNOSIS — J42 Unspecified chronic bronchitis: Secondary | ICD-10-CM | POA: Diagnosis not present

## 2018-07-09 DIAGNOSIS — J219 Acute bronchiolitis, unspecified: Secondary | ICD-10-CM | POA: Diagnosis present

## 2018-07-09 DIAGNOSIS — W19XXXA Unspecified fall, initial encounter: Secondary | ICD-10-CM

## 2018-07-09 DIAGNOSIS — N390 Urinary tract infection, site not specified: Secondary | ICD-10-CM

## 2018-07-09 DIAGNOSIS — R4182 Altered mental status, unspecified: Secondary | ICD-10-CM

## 2018-07-09 DIAGNOSIS — S0993XA Unspecified injury of face, initial encounter: Secondary | ICD-10-CM | POA: Diagnosis not present

## 2018-07-09 DIAGNOSIS — J40 Bronchitis, not specified as acute or chronic: Secondary | ICD-10-CM | POA: Diagnosis present

## 2018-07-09 DIAGNOSIS — S60211A Contusion of right wrist, initial encounter: Secondary | ICD-10-CM

## 2018-07-09 LAB — COMPREHENSIVE METABOLIC PANEL
ALT: 7 U/L (ref 0–44)
AST: 12 U/L — ABNORMAL LOW (ref 15–41)
Albumin: 3.3 g/dL — ABNORMAL LOW (ref 3.5–5.0)
Alkaline Phosphatase: 90 U/L (ref 38–126)
Anion gap: 7 (ref 5–15)
BUN: 8 mg/dL (ref 8–23)
CO2: 28 mmol/L (ref 22–32)
Calcium: 8.3 mg/dL — ABNORMAL LOW (ref 8.9–10.3)
Chloride: 101 mmol/L (ref 98–111)
Creatinine, Ser: 0.79 mg/dL (ref 0.44–1.00)
GFR calc Af Amer: >60 mL/min (ref >60)
GFR calc non Af Amer: >60 mL/min (ref >60)
Glucose, Bld: 111 mg/dL — ABNORMAL HIGH (ref 70–99)
Potassium: 3.9 mmol/L (ref 3.5–5.1)
Sodium: 136 mmol/L (ref 135–145)
Total Bilirubin: 0.2 mg/dL — ABNORMAL LOW (ref 0.3–1.2)
Total Protein: 5.9 g/dL — ABNORMAL LOW (ref 6.5–8.1)

## 2018-07-09 LAB — CBC
HCT: 31.8 % — ABNORMAL LOW (ref 36.0–46.0)
HCT: 32.3 % — ABNORMAL LOW (ref 36.0–46.0)
Hemoglobin: 10.5 g/dL — ABNORMAL LOW (ref 12.0–15.0)
Hemoglobin: 9.9 g/dL — ABNORMAL LOW (ref 12.0–15.0)
MCH: 28.5 pg (ref 26.0–34.0)
MCH: 29.4 pg (ref 26.0–34.0)
MCHC: 31.1 g/dL (ref 30.0–36.0)
MCHC: 32.5 g/dL (ref 30.0–36.0)
MCV: 90.5 fL (ref 80.0–100.0)
MCV: 91.6 fL (ref 80.0–100.0)
Platelets: 204 10*3/uL (ref 150–400)
Platelets: 216 10*3/uL (ref 150–400)
RBC: 3.47 MIL/uL — ABNORMAL LOW (ref 3.87–5.11)
RBC: 3.57 MIL/uL — ABNORMAL LOW (ref 3.87–5.11)
RDW: 13.6 % (ref 11.5–15.5)
RDW: 13.7 % (ref 11.5–15.5)
WBC: 5.5 10*3/uL (ref 4.0–10.5)
WBC: 7.8 10*3/uL (ref 4.0–10.5)
nRBC: 0 % (ref 0.0–0.2)
nRBC: 0 % (ref 0.0–0.2)

## 2018-07-09 LAB — RESPIRATORY PANEL BY PCR

## 2018-07-09 LAB — BLOOD GAS, VENOUS
Acid-Base Excess: 1.1 mmol/L (ref 0.0–2.0)
Bicarbonate: 29.6 mmol/L — ABNORMAL HIGH (ref 20.0–28.0)
O2 Saturation: 76.5 %
Patient temperature: 98.6
pCO2, Ven: 68.6 mmHg — ABNORMAL HIGH (ref 44.0–60.0)
pH, Ven: 7.258 (ref 7.250–7.430)
pO2, Ven: 45.8 mmHg — ABNORMAL HIGH (ref 32.0–45.0)

## 2018-07-09 LAB — FOLATE: Folate: 13.6 ng/mL (ref >5.9)

## 2018-07-09 LAB — BASIC METABOLIC PANEL
Anion gap: 11 (ref 5–15)
BUN: 9 mg/dL (ref 8–23)
CO2: 26 mmol/L (ref 22–32)
Calcium: 8.6 mg/dL — ABNORMAL LOW (ref 8.9–10.3)
Chloride: 100 mmol/L (ref 98–111)
Creatinine, Ser: 0.95 mg/dL (ref 0.44–1.00)
GFR calc Af Amer: >60 mL/min (ref >60)
GFR calc non Af Amer: 59 mL/min — ABNORMAL LOW (ref >60)
Glucose, Bld: 122 mg/dL — ABNORMAL HIGH (ref 70–99)
Potassium: 3.8 mmol/L (ref 3.5–5.1)
Sodium: 137 mmol/L (ref 135–145)

## 2018-07-09 LAB — ECHOCARDIOGRAM COMPLETE
Height: 61 in
Weight: 1576.73 oz

## 2018-07-09 LAB — IRON AND TIBC
Iron: 14 ug/dL — ABNORMAL LOW (ref 28–170)
Saturation Ratios: 5 % — ABNORMAL LOW (ref 10.4–31.8)
TIBC: 271 ug/dL (ref 250–450)
UIBC: 257 ug/dL

## 2018-07-09 LAB — RETICULOCYTES
Immature Retic Fract: 17.2 % — ABNORMAL HIGH (ref 2.3–15.9)
RBC.: 3.43 MIL/uL — ABNORMAL LOW (ref 3.87–5.11)
Retic Count, Absolute: 40.5 10*3/uL (ref 19.0–186.0)
Retic Ct Pct: 1.2 % (ref 0.4–3.1)

## 2018-07-09 LAB — HEPATIC FUNCTION PANEL
ALT: 6 U/L (ref 0–44)
AST: 13 U/L — ABNORMAL LOW (ref 15–41)
Albumin: 3.4 g/dL — ABNORMAL LOW (ref 3.5–5.0)
Alkaline Phosphatase: 99 U/L (ref 38–126)
Bilirubin, Direct: 0.1 mg/dL (ref 0.0–0.2)
Indirect Bilirubin: 0.3 mg/dL (ref 0.3–0.9)
Total Bilirubin: 0.4 mg/dL (ref 0.3–1.2)
Total Protein: 6.4 g/dL — ABNORMAL LOW (ref 6.5–8.1)

## 2018-07-09 LAB — TROPONIN I (HIGH SENSITIVITY)
Troponin I (High Sensitivity): 19.2 ng/L — ABNORMAL HIGH (ref <18)
Troponin I (High Sensitivity): 19.5 ng/L — ABNORMAL HIGH (ref <18)
Troponin I (High Sensitivity): 23.1 ng/L — ABNORMAL HIGH (ref <18)

## 2018-07-09 LAB — LACTIC ACID, PLASMA
Lactic Acid, Venous: 0.5 mmol/L (ref 0.5–1.9)
Lactic Acid, Venous: 0.6 mmol/L (ref 0.5–1.9)

## 2018-07-09 LAB — D-DIMER, QUANTITATIVE: D-Dimer, Quant: 1.33 ug/mL-FEU — ABNORMAL HIGH (ref 0.00–0.50)

## 2018-07-09 LAB — TSH: TSH: 1.661 u[IU]/mL (ref 0.350–4.500)

## 2018-07-09 LAB — BRAIN NATRIURETIC PEPTIDE: B Natriuretic Peptide: 147.3 pg/mL — ABNORMAL HIGH (ref 0.0–100.0)

## 2018-07-09 LAB — CK TOTAL AND CKMB (NOT AT ARMC)
CK, MB: 4.2 ng/mL (ref 0.5–5.0)
Relative Index: INVALID (ref 0.0–2.5)
Total CK: 75 U/L (ref 38–234)

## 2018-07-09 LAB — VITAMIN B12: Vitamin B-12: 533 pg/mL (ref 180–914)

## 2018-07-09 LAB — SARS CORONAVIRUS 2 BY RT PCR (HOSPITAL ORDER, PERFORMED IN ~~LOC~~ HOSPITAL LAB): SARS Coronavirus 2: NEGATIVE

## 2018-07-09 LAB — FERRITIN: Ferritin: 55 ng/mL (ref 11–307)

## 2018-07-09 MED ORDER — GUAIFENESIN ER 600 MG PO TB12
1200.0000 mg | ORAL_TABLET | Freq: Two times a day (BID) | ORAL | Status: DC
  Administered 2018-07-09: 1200 mg via ORAL
  Filled 2018-07-09: qty 2

## 2018-07-09 MED ORDER — ACETAMINOPHEN 325 MG PO TABS
650.0000 mg | ORAL_TABLET | Freq: Four times a day (QID) | ORAL | Status: DC | PRN
  Administered 2018-07-09: 10:00:00 650 mg via ORAL
  Filled 2018-07-09: qty 2

## 2018-07-09 MED ORDER — ORAL CARE MOUTH RINSE
15.0000 mL | Freq: Two times a day (BID) | OROMUCOSAL | Status: DC
  Administered 2018-07-09: 15 mL via OROMUCOSAL

## 2018-07-09 MED ORDER — SODIUM CHLORIDE 0.9 % IV SOLN: 250.0000 mL | INTRAVENOUS | Status: DC | PRN

## 2018-07-09 MED ORDER — APIXABAN 5 MG PO TABS
5.0000 mg | ORAL_TABLET | Freq: Two times a day (BID) | ORAL | Status: DC
  Administered 2018-07-09: 5 mg via ORAL
  Filled 2018-07-09: qty 1

## 2018-07-09 MED ORDER — ACETAMINOPHEN 650 MG RE SUPP: 650.0000 mg | Freq: Four times a day (QID) | RECTAL | Status: DC | PRN

## 2018-07-09 MED ORDER — FLUTICASONE PROPIONATE 50 MCG/ACT NA SUSP: 2.0000 | Freq: Every day | NASAL | 0 refills | Status: DC

## 2018-07-09 MED ORDER — SODIUM CHLORIDE 0.9 % IV SOLN
1.0000 g | Freq: Every day | INTRAVENOUS | Status: DC
  Administered 2018-07-09: 1 g via INTRAVENOUS
  Filled 2018-07-09: qty 1

## 2018-07-09 MED ORDER — SODIUM CHLORIDE 0.9% FLUSH: 3.0000 mL | INTRAVENOUS | Status: DC | PRN

## 2018-07-09 MED ORDER — AZITHROMYCIN 250 MG PO TABS: 250.0000 mg | ORAL_TABLET | Freq: Every day | ORAL | 0 refills | Status: AC

## 2018-07-09 MED ORDER — GUAIFENESIN ER 600 MG PO TB12: 1200.0000 mg | ORAL_TABLET | Freq: Two times a day (BID) | ORAL | 0 refills | Status: AC

## 2018-07-09 MED ORDER — SENNA 8.6 MG PO TABS: 1.0000 | ORAL_TABLET | Freq: Every day | ORAL | 0 refills | Status: DC

## 2018-07-09 MED ORDER — IPRATROPIUM-ALBUTEROL 0.5-2.5 (3) MG/3ML IN SOLN
3.0000 mL | Freq: Three times a day (TID) | RESPIRATORY_TRACT | Status: DC
  Administered 2018-07-09: 3 mL via RESPIRATORY_TRACT
  Filled 2018-07-09: qty 3

## 2018-07-09 MED ORDER — SODIUM CHLORIDE (PF) 0.9 % IJ SOLN
INTRAMUSCULAR | Status: AC
  Administered 2018-07-09: 01:00:00
  Filled 2018-07-09: qty 50

## 2018-07-09 MED ORDER — ALBUTEROL SULFATE (2.5 MG/3ML) 0.083% IN NEBU: 2.5000 mg | INHALATION_SOLUTION | RESPIRATORY_TRACT | Status: DC | PRN

## 2018-07-09 MED ORDER — FLUTICASONE PROPIONATE 50 MCG/ACT NA SUSP
2.0000 | Freq: Every day | NASAL | Status: DC
  Administered 2018-07-09: 2 via NASAL
  Filled 2018-07-09: qty 16

## 2018-07-09 MED ORDER — SODIUM CHLORIDE 0.9 % IV SOLN
500.0000 mg | Freq: Once | INTRAVENOUS | Status: AC
  Administered 2018-07-09: 500 mg via INTRAVENOUS
  Filled 2018-07-09: qty 500

## 2018-07-09 MED ORDER — IOHEXOL 350 MG/ML SOLN
100.0000 mL | Freq: Once | INTRAVENOUS | Status: AC | PRN
  Administered 2018-07-09: 02:00:00 80 mL via INTRAVENOUS

## 2018-07-09 MED ORDER — AZITHROMYCIN 250 MG PO TABS: 500.0000 mg | ORAL_TABLET | Freq: Every day | ORAL | Status: DC

## 2018-07-09 MED ORDER — SODIUM CHLORIDE 0.9% FLUSH: 3.0000 mL | Freq: Two times a day (BID) | INTRAVENOUS | Status: DC

## 2018-07-09 MED ORDER — LORATADINE 10 MG PO TABS: 10.0000 mg | ORAL_TABLET | Freq: Every day | ORAL | Status: DC

## 2018-07-09 MED ORDER — AZITHROMYCIN 250 MG PO TABS: 250.0000 mg | ORAL_TABLET | Freq: Every day | ORAL | Status: DC

## 2018-07-09 NOTE — Progress Notes (Signed)
Pt came down to MRI. Pt is unable to lay flat more than 20secs without immediately sitting up. Her exams take about and hour and half of scan time together. States she feels like something is in her throat and she never lays flat. Pt was attempted twice. Once for her MRI of the brain and even for her MRI abd. Both exams she refused due to do. Due to too uncomfortable and inability to lay flat. Pt also appears to be claustrophobic. RN informed. With pt's current condition she will be unable to preform her scans.

## 2018-07-09 NOTE — Progress Notes (Signed)
ANTICOAGULATION CONSULT NOTE - Initial Consult  Pharmacy Consult for Apixaban Indication: atrial fibrillation  No Known Allergies  Patient Measurements: Height: 5\' 1"  (154.9 cm) Weight: 98 lb 8.7 oz (44.7 kg) IBW/kg (Calculated) : 47.8   Vital Signs: Temp: 98.2 F (36.8 C) (06/25 0400) Temp Source: Oral (06/25 0400) BP: 146/63 (06/25 0400) Pulse Rate: 62 (06/25 0400)  Labs: Recent Labs    07/08/18 2345  HGB 10.5*  HCT 32.3*  PLT 216  CREATININE 0.95    Estimated Creatinine Clearance: 36.1 mL/min (by C-G formula based on SCr of 0.95 mg/dL).   Medical History: Past Medical History:  Diagnosis Date  . Accidental drug overdose   . Acute encephalopathy   . Acute on chronic respiratory failure with hypoxia ([place]) 03/01/2017  . Acute renal failure ([place]) 04/16/2014  . AKI (acute kidney injury) ([place]) 04/30/2014  . Altered mental status 06/27/2012  . Anxiety   . Atrial fibrillation ([place])   . CAD (coronary artery disease)   . CAP (community acquired pneumonia) 04/18/2014  . Cardiomyopathy ([place])   . Chronic hypoxemic respiratory failure ([place]) 06/26/2012  . Chronic pain   . COPD exacerbation ([place]) 06/26/2012  . COPD with acute exacerbation ([place]) 03/01/2017  . DEPRESSIVE DISORDER, NOS 03/13/2006   Qualifier: Diagnosis of  By: Damita Dunnings MD, Phillip Heal    . Edema, peripheral   . Essential hypertension 04/30/2014  . Fall at home, initial encounter 03/01/2017  . GASTROESOPHAGEAL REFLUX, NO ESOPHAGITIS 03/13/2006   Qualifier: Diagnosis of  By: Damita Dunnings MD, Phillip Heal    . HYPERCHOLESTEROLEMIA 03/13/2006   Qualifier: Diagnosis of  By: Damita Dunnings MD, Phillip Heal    . Hyperlipidemia   . HYPERTENSION, BENIGN SYSTEMIC 03/13/2006   Qualifier: Diagnosis of  By: Damita Dunnings MD, Phillip Heal    . Hypokalemia 04/30/2014  . Hyponatremia 04/30/2014  . Hypotension 04/30/2014  . Hypoxemia 06/26/2012  . Hypoxia 03/26/2018  . Irritable bowel syndrome 03/13/2006   Qualifier: Diagnosis of  By: Damita Dunnings MD, Phillip Heal    . Lung nodule 04/16/2014   . MENOPAUSAL SYNDROME 03/13/2006   Qualifier: Diagnosis of  By: Damita Dunnings MD, Phillip Heal    . Mood disorder [facility]) 03/13/2006   Qualifier: Diagnosis of  By: Damita Dunnings MD, Phillip Heal    . 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 Nausea 04/30/2014  . Nausea vomiting and diarrhea 04/16/2014  . OSTEOARTHRITIS, MULTI SITES 03/13/2006   Qualifier: Diagnosis of  By: Damita Dunnings MD, Phillip Heal    . Pulmonary nodule 04/30/2014  . Respiratory failure ([place]) 01/22/2017  . TOBACCO DEPENDENCE 03/13/2006   Qualifier: Diagnosis of  By: Damita Dunnings MD, Phillip Heal    . Weight loss     Medications:  Scheduled:  . sodium chloride (PF)      . sodium chloride flush  3 mL Intravenous Q12H   Infusions:  . sodium chloride    . azithromycin 500 mg (07/09/18 0432)  . cefTRIAXone (ROCEPHIN)  IV      Assessment: 73 yoF on apixaban PTA now s/p fall and right hand pain.     Plan:  Apixaban 5 mg bid   Dorrene German 07/09/2018,5:04 AM

## 2018-07-09 NOTE — ED Notes (Signed)
Patient transported to CT 

## 2018-07-09 NOTE — TOC Initial Note (Signed)
Transition of Care [facility]) - Initial/Assessment Note    Patient Details  Name: Allison Vaughan MRN: [id number] Date of Birth: 05-07-1945  [facility] CM/SW Contact:    [name], LCSW Phone Number: 07/09/2018, 2:59 PM  Clinical Narrative:   CSW spoke to patient about discharge plans. Patient stated she does not want [place] in the home. Patient stated her family will pick her up from the hospital once medically stable                 Expected Discharge Plan: [place] Barriers to Discharge: No Barriers Identified   Patient Goals and CMS Choice        Expected Discharge Plan and Services Expected Discharge Plan: [place]                                              Prior Living Arrangements/Services     Patient language and need for interpreter reviewed:: Yes        Need for Family Participation in Patient Care: Yes (Comment) Care giver support system in place?: Yes (comment)   Criminal Activity/Legal Involvement Pertinent to Current Situation/Hospitalization: No - Comment as needed  Activities of Daily Living Home Assistive Devices/Equipment: Cane (specify quad or straight), Wheelchair, Environmental consultant (specify type), Nebulizer, Blood pressure cuff ADL Screening (condition at time of admission) Patient's cognitive ability adequate to safely complete daily activities?: Yes Is the patient deaf or have difficulty hearing?: No Does the patient have difficulty seeing, even when wearing glasses/contacts?: No Does the patient have difficulty concentrating, remembering, or making decisions?: No Patient able to express need for assistance with ADLs?: Yes Does the patient have difficulty dressing or bathing?: No Independently performs ADLs?: Yes (appropriate for developmental age) Does the patient have difficulty walking or climbing stairs?: Yes Weakness of Legs: Both Weakness of Arms/Hands: Both  Permission  Sought/Granted                  Emotional Assessment Appearance:: Appears stated age   Affect (typically observed): Accepting Orientation: : Oriented to Self, Oriented to Place, Oriented to  Time, Oriented to Situation Alcohol / Substance Use: Not Applicable Psych Involvement: No (comment)  Admission diagnosis:  Contusion of right wrist, initial encounter [S60.211A] Contusion of face, initial encounter [S00.83XA] Fall, initial encounter [W19.XXXA] Patient Active Problem List   Diagnosis Date Noted  . Altered mental status 07/09/2018  . Acute lower UTI 07/09/2018  . Anemia 07/09/2018  . Mild renal insufficiency 03/26/2018  . 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 Atrial fibrillation with RVR ([place]) 03/01/2017  . Acute on chronic respiratory failure with hypoxia ([place]) 03/01/2017  . Fall at home, initial encounter 03/01/2017  . Accidental drug overdose   . Respiratory failure ([place]) 01/22/2017  . AKI (acute kidney injury) ([place]) 04/30/2014  . Pulmonary nodule 04/30/2014  . Essential hypertension 04/30/2014  . Hypokalemia 04/30/2014  . Hyponatremia 04/30/2014  . Hypotension 04/30/2014  . Nausea 04/30/2014  . Lung nodule 04/16/2014  . Chronic hypoxemic respiratory failure ([place]) 06/26/2012  . COPD (chronic obstructive pulmonary disease) ([place]) 06/26/2012  . HYPERCHOLESTEROLEMIA 03/13/2006  . TOBACCO DEPENDENCE 03/13/2006  . Mood disorder ([place]) 03/13/2006  . HYPERTENSION, BENIGN SYSTEMIC 03/13/2006  . GASTROESOPHAGEAL REFLUX, NO ESOPHAGITIS 03/13/2006  . IRRITABLE BOWEL SYNDROME 03/13/2006  . MENOPAUSAL SYNDROME 03/13/2006  .  OSTEOARTHRITIS, MULTI SITES 03/13/2006   PCP:  Patient, No Pcp Per Pharmacy:   [place], [address] [place] [address] Phone: [phone] Fax: [place], [place] - [address] [place]  [address] Phone: [phone] Fax: [phone]     Social Determinants of Health (SDOH) Interventions    Readmission Risk Interventions Readmission Risk Prevention Plan 03/28/2018  Transportation Screening Complete  Home Care Screening Complete  Medication Review (RN CM) Complete  Some recent data might be hidden

## 2018-07-09 NOTE — Discharge Summary (Signed)
Physician Discharge Summary  ZHANA JEANGILLES CNO:[id number] DOB: 1945-07-01 DOA: 07/08/2018  PCP: Patient, No Pcp Per  Admit date: 07/08/2018 Discharge date: 07/09/2018  Time spent: 50 minutes  Recommendations for Outpatient Follow-up:  1. Follow-up with PCP in 1 to 2 weeks.  On follow-up urine cultures will need to be followed up upon.  Patient will need to be set up for outpatient MRI of abdomen and pelvis to follow-up on dilatation of distal pancreatic duct with abrupt cut off at the level of the pancreatic body.  Patient unable to tolerate MRI abdomen and pelvis during the hospitalization.   Discharge Diagnoses:  Principal Problem:   Acute metabolic encephalopathy Active Problems:   COPD (chronic obstructive pulmonary disease) (HCC)   Altered mental status   Acute lower UTI   Anemia   Bronchiolitis   Bronchitis   Contusion of right wrist   Fall   Bacteria in urine   Discharge Condition: Stable and improved  Diet recommendation: Heart healthy  Filed Weights   07/08/18 2029 07/09/18 0400  Weight: 44.5 kg 44.7 kg    History of present illness:  Per Dr. Orlie Dakin  is a 73 y.o. female,   w hx of Copd, hypertension, hyperlipidemia,  Chronic systolic CHF, Pafib, chronic pain, apparently presented with c/o Fall 2 days ago, with right wrist pain. Pt stated slight dizziness x2 days.    In ED,  T 98.2  P 62  R 18  Bp 146/63  Pox 95% Wt 44.7kg  Wbc 5.5, Hgb 9.9, Plt 204 Bun 8, Creatinine 0.79 Glucose 111 Lactic acid 0.5  Urinalysis wbc 6-10, rbc 0-5  pt was arousable but apparently appeared somewhat confused and per EMS had somewhat low o2 sat.  Pt noted that she has had dyspnea but this was chronic.    Xray right hand IMPRESSION: 1. No acute displaced fracture or dislocation. 2. Degenerative changes as above. 3. Soft tissue swelling about the hand.  CT brain IMPRESSION: 1. No acute intracranial hemorrhage. 2. No acute/traumatic cervical spine  pathology. 3. No acute facial bone fractures.  CTA chest, CT abd/ pelvis IMPRESSION: 1. No PE identified. 2. Scattered tree-in-bud opacities bilaterally with areas of bronchial wall thickening and mucus plugging is suspicious for infectious bronchiolitis. There is a small amount of atelectasis at the right lung base. 3. No acute intra-abdominal abnormality. 4. Dilatation of the distal pancreatic duct with an abrupt cutoff at the level of the pancreatic body. This is new since the patient's CT in 2016. An underlying mass cannot be excluded in this location. Follow-up with a nonemergent outpatient contrast enhanced MRI is recommended for further evaluation of this finding. 5. Splenomegaly. 6. Aortic Atherosclerosis (ICD10-I70.0). 7. There is a small amount of free fluid in the patient's pelvis with no identifiable cause. The appendix is not reliably identified on this exam. There is a moderate amount of stool throughout the colon. There is some distention of the gastric antrum of unknown clinical significance.  Pt will be admitted for observation of dizziness, and recurrent falls and slight confusion ? Currently axox3  Hospital Course:  1 dizziness/acute metabolic encephalopathy/confusion Patient was admitted with dizziness prior to fall.  CT head CT C-spine were unremarkable.  Patient on my examination during the hospitalization was alert and oriented x3.  Patient denied any chest pain or worsening shortness of breath.  Patient did not have a significant productive cough.  Patient denied any dysuria.  Urinalysis which was done was worrisome for possible UTI  however patient remained asymptomatic.  MRI of the brain was attempted however patient unable to tolerate it and as such it was canceled.  Patient had no focal neurological deficits.  Patient was insistent on being discharged home.  Patient seemed to be in stable condition and likely at baseline.  CT chest which was done was  negative for PE.  COVID-19 was negative.  Respiratory viral panel was negative.  Patient placed empirically on azithromycin for bronchiolitis/bronchitis noted on CT chest.  Patient improved clinically and patient be discharged home on 4 more days of oral azithromycin to complete a 5-day course of antibiotic treatment.  2.  Bronchiolitis/bronchitis Noted on CT angiogram of chest.  COVID-19 was negative.  Respiratory viral panel was negative.  Patient was placed on Mucinex twice daily, Flonase Claritin and azithromycin.  Patient seems stable.  Patient was insistent on being discharged home.  Patient be discharged home on 4 more days of azithromycin, Flonase, Claritin and Mucinex.  Outpatient follow-up with PCP.  3.  Recurrent falls Patient was seen by PT who had recommended home health therapy.  Patient however refused home health therapy.  Outpatient follow-up.  4.  Pancreatic ductal dilatation Noted on CT abdomen and pelvis.  MRI was recommended however patient unable to tolerate MRI of the abdomen.  Will need outpatient follow-up with PCP.  5.  Paroxysmal atrial fibrillation Remained rate controlled on Cardizem during the hospitalization.  Patient maintained on home regimen of Eliquis for anticoagulation.  6.  Hyperlipidemia Patient maintained on a statin.  7.  COPD Remained stable.  Patient maintained on home regimen of Dulera, Incruse, duo nebs.  Outpatient follow-up.  8.  Anxiety Patient maintained on home regimen of Prozac.  10.  Probable UTI versus bacteria in urine Urinalysis which was done was worrisome for UTI.  Patient remained afebrile.  Urine cultures pending at time of discharge.  Was asymptomatic and denied any dysuria.  Outpatient follow-up with PCP.  11.  Contusion of the right wrist Patient presents with pain in the right wrist after sustaining a fall 2 days prior to admission.  Plain films of the right hand and right wrist were obtained which were negative for any  fracture or dislocation.  Patient was placed on supportive care.  Outpatient follow-up.  Procedures:  Plain films of the right hand 07/08/2018  Plain films of the right wrist 07/08/2018  2D echo 07/09/2018  CT angiogram chest 07/09/2018  CT abdomen and pelvis 07/09/2018  CT of the head C-spine and maxillofacial 07/09/2018  Consultations:  None  Discharge Exam: Vitals:   07/09/18 1327 07/09/18 1346  BP:  121/68  Pulse: 84 61  Resp: 17 18  Temp:  98.5 F (36.9 C)  SpO2: 99%     General: NAD Cardiovascular: RRR Respiratory: Diminished breath sounds in the bases otherwise clear.  Discharge Instructions   Discharge Instructions    Diet - low sodium heart healthy   Complete by: As directed    Increase activity slowly   Complete by: As directed      Allergies as of 07/09/2018   No Known Allergies     Medication List    STOP taking these medications   diltiazem 300 MG 24 hr capsule Commonly known as: TIAZAC   predniSONE 10 MG tablet Commonly known as: DELTASONE     TAKE these medications   albuterol 108 (90 Base) MCG/ACT inhaler Commonly known as: VENTOLIN HFA Inhale 2 puffs into the lungs every 6 (six) hours as needed  for wheezing.   albuterol (2.5 MG/3ML) 0.083% nebulizer solution Commonly known as: PROVENTIL Take 3 mLs (2.5 mg total) by nebulization every 2 (two) hours as needed for wheezing.   apixaban 5 MG Tabs tablet Commonly known as: Eliquis Take 1 tablet (5 mg total) by mouth 2 (two) times daily.   atorvastatin 10 MG tablet Commonly known as: LIPITOR Take 1 tablet (10 mg total) by mouth at bedtime.   azithromycin 250 MG tablet Commonly known as: ZITHROMAX Take 1 tablet (250 mg total) by mouth daily for 4 days. Start taking on: July 10, 2018   diltiazem 300 MG 24 hr capsule Commonly known as: Cardizem CD Take 1 capsule (300 mg total) by mouth daily.   feeding supplement (ENSURE ENLIVE) Liqd Take 237 mLs by mouth 2 (two) times daily  between meals. What changed: when to take this   FLUoxetine 40 MG capsule Commonly known as: PROZAC Take 40 mg by mouth every morning.   fluticasone 50 MCG/ACT nasal spray Commonly known as: FLONASE Place 2 sprays into both nostrils daily.   guaiFENesin 600 MG 12 hr tablet Commonly known as: MUCINEX Take 2 tablets (1,200 mg total) by mouth 2 (two) times daily for 5 days.   ibuprofen 200 MG tablet Commonly known as: ADVIL Take 400 mg by mouth every 6 (six) hours as needed for moderate pain.   LORazepam 0.5 MG tablet Commonly known as: ATIVAN Take 0.5 mg by mouth every 8 (eight) hours as needed for anxiety.   mometasone-formoterol 200-5 MCG/ACT Aero Commonly known as: DULERA Inhale 2 puffs into the lungs 2 (two) times daily.   ondansetron 4 MG tablet Commonly known as: ZOFRAN Take 1 tablet (4 mg total) by mouth every 6 (six) hours. What changed:   when to take this  reasons to take this   Oxycodone HCl 10 MG Tabs Take 10 mg by mouth 2 (two) times daily as needed for pain.   potassium chloride SA 20 MEQ tablet Commonly known as: K-DUR Take 20 mEq by mouth daily.   senna 8.6 MG Tabs tablet Commonly known as: SENOKOT Take 1 tablet (8.6 mg total) by mouth at bedtime.   umeclidinium bromide 62.5 MCG/INH Aepb Commonly known as: INCRUSE ELLIPTA Inhale 1 puff into the lungs daily.      No Known Allergies Follow-up Information    PCP. Schedule an appointment as soon as possible for a visit in 1 week(s).   Why: f/u in 1-2 weeks.           The results of significant diagnostics from this hospitalization (including imaging, microbiology, ancillary and laboratory) are listed below for reference.    Significant Diagnostic Studies: Dg Chest 2 View  Result Date: 07/06/2018 CLINICAL DATA:  Shortness of breath. Fall today. EXAM: CHEST - 2 VIEW COMPARISON:  06/02/2018 FINDINGS: Unchanged heart size and mediastinal contours. Unchanged aortic tortuosity and  atherosclerosis. Slight chronic interstitial coarsening which is unchanged from prior. No acute airspace disease. No pleural effusion or pneumothorax. No pulmonary edema. Mild scoliotic curvature of spine. No acute osseous abnormalities are seen. IMPRESSION: No acute chest findings. Aortic Atherosclerosis (ICD10-I70.0). Electronically Signed   By: Keith Rake M.D.   On: 07/06/2018 21:00   Dg Wrist Complete Right  Result Date: 07/08/2018 CLINICAL DATA:  Pain following fall EXAM: RIGHT WRIST - COMPLETE 3+ VIEW COMPARISON:  None. FINDINGS: Frontal, oblique, lateral, and ulnar deviation scaphoid images were obtained. There is no acute fracture or dislocation. There is marked osteoarthritic change in  the first carpal-metacarpal and scaphotrapezial joints. There is heterotopic bone lateral to the trapezium bone. There is remodeling of the trapezium bone. There is milder osteoarthritic change in the radiocarpal joint. There is osteoarthritic change in all MCP joints. No bony destruction or erosion. IMPRESSION: Multifocal arthropathy, most marked in the first carpal-metacarpal and scaphotrapezial joints. There is heterotopic bone which may represent a degree of myositis ossificans lateral to the trapezium bone. No acute fracture or dislocation. Electronically Signed   By: Lowella Grip III M.D.   On: 07/08/2018 21:15   Ct Head Wo Contrast  Result Date: 07/09/2018 CLINICAL DATA:  73 year old female with head trauma. EXAM: CT HEAD WITHOUT CONTRAST CT MAXILLOFACIAL WITHOUT CONTRAST CT CERVICAL SPINE WITHOUT CONTRAST TECHNIQUE: Multidetector CT imaging of the head, cervical spine, and maxillofacial structures were performed using the standard protocol without intravenous contrast. Multiplanar CT image reconstructions of the cervical spine and maxillofacial structures were also generated. COMPARISON:  Head CT dated 06/08/2018 FINDINGS: CT HEAD FINDINGS Brain: The ventricles and sulci appropriate size for  patient's age. Confluent periventricular and centrum semiovale white matter hypodensities most consistent with chronic microvascular ischemic changes or chronic white matter disease. There is no acute intracranial hemorrhage. No mass effect or midline shift. No extra-axial fluid collection. Vascular: No hyperdense vessel or unexpected calcification. Skull: No acute calvarial pathology. Scattered small calvarial lucencies similar to prior CT. Other: None CT MAXILLOFACIAL FINDINGS Osseous: No acute fracture. No mandibular subluxation. Degenerative changes of the TMJs bilaterally. Irregularity of the right nasal bone without overlying soft tissue swelling. This is similar to prior CT and consistent with an old fracture. Orbits: The globes and retro-orbital fat are preserved. Left cataract surgery. Sinuses: Complete opacification of the right maxillary sinus. The remainder of the visualized paranasal sinuses and mastoid air cells are clear. Soft tissues: Negative. CT CERVICAL SPINE FINDINGS Alignment: No acute subluxation. Grade 1 C7-T1 anterolisthesis. Skull base and vertebrae: No acute fracture. Osteopenia. Small sclerotic focus at T2, indeterminate, possibly a bone island. Soft tissues and spinal canal: No prevertebral fluid or swelling. No visible canal hematoma. Disc levels:  Multilevel degenerative changes. Upper chest: Atherosclerotic calcification of the aortic arch. Bilateral carotid bulb calcified plaques. Other: None IMPRESSION: 1. No acute intracranial hemorrhage. 2. No acute/traumatic cervical spine pathology. 3. No acute facial bone fractures. Electronically Signed   By: Anner Crete M.D.   On: 07/09/2018 02:18   Ct Angio Chest Pe W And/or Wo Contrast  Result Date: 07/09/2018 CLINICAL DATA:  Positive D-dimer with PE suspected. History of AFib and COPD. EXAM: CT ANGIOGRAPHY CHEST CT ABDOMEN AND PELVIS WITH CONTRAST TECHNIQUE: Multidetector CT imaging of the chest was performed using the standard  protocol during bolus administration of intravenous contrast. Multiplanar CT image reconstructions and MIPs were obtained to evaluate the vascular anatomy. Multidetector CT imaging of the abdomen and pelvis was performed using the standard protocol during bolus administration of intravenous contrast. CONTRAST:  48mL OMNIPAQUE IOHEXOL 350 MG/ML SOLN COMPARISON:  CT chest dated May 01, 2014. FINDINGS: CTA CHEST FINDINGS Cardiovascular: Satisfactory opacification of the pulmonary arteries to the segmental level. No evidence of pulmonary embolism. Normal heart size. No pericardial effusion. Aortic calcifications are noted. Coronary artery calcifications are noted. Mediastinum/Nodes: No enlarged mediastinal, hilar, or axillary lymph nodes. Thyroid gland, trachea, and esophagus demonstrate no significant findings. Lungs/Pleura: There are few small scattered pulmonary nodules bilaterally. These appear stable from prior studies. There are a few tree-in-bud opacities in the right upper lobe. There is some bronchial wall  thickening and mucus plugging at the lung bases bilaterally. There is no large pleural effusion. No pneumothorax. There is atelectasis at the right lung base. Musculoskeletal: No chest wall abnormality. No acute or significant osseous findings. Review of the MIP images confirms the above findings. CT ABDOMEN and PELVIS FINDINGS Hepatobiliary: No focal liver abnormality is seen. No gallstones, gallbladder wall thickening, or biliary dilatation. Pancreas: There is significant dilatation of the distal pancreatic duct which appears new since prior CT in 2016. There is an ill-defined soft tissue density in the pancreatic body. The distal pancreatic duct is unremarkable. The common bile duct is not significantly dilated. Spleen: The spleen is significantly enlarged measuring approximately 12.5 cm craniocaudad. Adrenals/Urinary Tract: There are multiple nonobstructing stones involving both kidneys. There is no  hydronephrosis. The adrenal glands are unremarkable. The bladder is moderately distended. Stomach/Bowel: There is a moderate amount of stool in the colon. There is some dilatation of the gastric antrum and pylorus of unknown clinical significance. There is no evidence of a small-bowel obstruction. The appendix is not reliably identified on this exam. Vascular/Lymphatic: Aortic atherosclerosis. No enlarged abdominal or pelvic lymph nodes. The proximal right SFA is occluded. The proximal left SFA is likely also occluded but is not well evaluated on this exam. Reproductive: Status post hysterectomy. No adnexal masses. Other: There is a small amount of free fluid in the patient's pelvis of unknown clinical significance. Musculoskeletal: There are advanced degenerative changes of lumbar spine. Review of the MIP images confirms the above findings. IMPRESSION: 1. No PE identified. 2. Scattered tree-in-bud opacities bilaterally with areas of bronchial wall thickening and mucus plugging is suspicious for infectious bronchiolitis. There is a small amount of atelectasis at the right lung base. 3. No acute intra-abdominal abnormality. 4. Dilatation of the distal pancreatic duct with an abrupt cutoff at the level of the pancreatic body. This is new since the patient's CT in 2016. An underlying mass cannot be excluded in this location. Follow-up with a nonemergent outpatient contrast enhanced MRI is recommended for further evaluation of this finding. 5. Splenomegaly. 6.  Aortic Atherosclerosis (ICD10-I70.0). 7. There is a small amount of free fluid in the patient's pelvis with no identifiable cause. The appendix is not reliably identified on this exam. There is a moderate amount of stool throughout the colon. There is some distention of the gastric antrum of unknown clinical significance. Electronically Signed   By: Constance Holster M.D.   On: 07/09/2018 02:34   Ct Cervical Spine Wo Contrast  Result Date:  07/09/2018 CLINICAL DATA:  73 year old female with head trauma. EXAM: CT HEAD WITHOUT CONTRAST CT MAXILLOFACIAL WITHOUT CONTRAST CT CERVICAL SPINE WITHOUT CONTRAST TECHNIQUE: Multidetector CT imaging of the head, cervical spine, and maxillofacial structures were performed using the standard protocol without intravenous contrast. Multiplanar CT image reconstructions of the cervical spine and maxillofacial structures were also generated. COMPARISON:  Head CT dated 06/08/2018 FINDINGS: CT HEAD FINDINGS Brain: The ventricles and sulci appropriate size for patient's age. Confluent periventricular and centrum semiovale white matter hypodensities most consistent with chronic microvascular ischemic changes or chronic white matter disease. There is no acute intracranial hemorrhage. No mass effect or midline shift. No extra-axial fluid collection. Vascular: No hyperdense vessel or unexpected calcification. Skull: No acute calvarial pathology. Scattered small calvarial lucencies similar to prior CT. Other: None CT MAXILLOFACIAL FINDINGS Osseous: No acute fracture. No mandibular subluxation. Degenerative changes of the TMJs bilaterally. Irregularity of the right nasal bone without overlying soft tissue swelling. This is similar to  prior CT and consistent with an old fracture. Orbits: The globes and retro-orbital fat are preserved. Left cataract surgery. Sinuses: Complete opacification of the right maxillary sinus. The remainder of the visualized paranasal sinuses and mastoid air cells are clear. Soft tissues: Negative. CT CERVICAL SPINE FINDINGS Alignment: No acute subluxation. Grade 1 C7-T1 anterolisthesis. Skull base and vertebrae: No acute fracture. Osteopenia. Small sclerotic focus at T2, indeterminate, possibly a bone island. Soft tissues and spinal canal: No prevertebral fluid or swelling. No visible canal hematoma. Disc levels:  Multilevel degenerative changes. Upper chest: Atherosclerotic calcification of the aortic  arch. Bilateral carotid bulb calcified plaques. Other: None IMPRESSION: 1. No acute intracranial hemorrhage. 2. No acute/traumatic cervical spine pathology. 3. No acute facial bone fractures. Electronically Signed   By: Anner Crete M.D.   On: 07/09/2018 02:18   Ct Abdomen Pelvis W Contrast  Result Date: 07/09/2018 CLINICAL DATA:  Positive D-dimer with PE suspected. History of AFib and COPD. EXAM: CT ANGIOGRAPHY CHEST CT ABDOMEN AND PELVIS WITH CONTRAST TECHNIQUE: Multidetector CT imaging of the chest was performed using the standard protocol during bolus administration of intravenous contrast. Multiplanar CT image reconstructions and MIPs were obtained to evaluate the vascular anatomy. Multidetector CT imaging of the abdomen and pelvis was performed using the standard protocol during bolus administration of intravenous contrast. CONTRAST:  63mL OMNIPAQUE IOHEXOL 350 MG/ML SOLN COMPARISON:  CT chest dated May 01, 2014. FINDINGS: CTA CHEST FINDINGS Cardiovascular: Satisfactory opacification of the pulmonary arteries to the segmental level. No evidence of pulmonary embolism. Normal heart size. No pericardial effusion. Aortic calcifications are noted. Coronary artery calcifications are noted. Mediastinum/Nodes: No enlarged mediastinal, hilar, or axillary lymph nodes. Thyroid gland, trachea, and esophagus demonstrate no significant findings. Lungs/Pleura: There are few small scattered pulmonary nodules bilaterally. These appear stable from prior studies. There are a few tree-in-bud opacities in the right upper lobe. There is some bronchial wall thickening and mucus plugging at the lung bases bilaterally. There is no large pleural effusion. No pneumothorax. There is atelectasis at the right lung base. Musculoskeletal: No chest wall abnormality. No acute or significant osseous findings. Review of the MIP images confirms the above findings. CT ABDOMEN and PELVIS FINDINGS Hepatobiliary: No focal liver abnormality  is seen. No gallstones, gallbladder wall thickening, or biliary dilatation. Pancreas: There is significant dilatation of the distal pancreatic duct which appears new since prior CT in 2016. There is an ill-defined soft tissue density in the pancreatic body. The distal pancreatic duct is unremarkable. The common bile duct is not significantly dilated. Spleen: The spleen is significantly enlarged measuring approximately 12.5 cm craniocaudad. Adrenals/Urinary Tract: There are multiple nonobstructing stones involving both kidneys. There is no hydronephrosis. The adrenal glands are unremarkable. The bladder is moderately distended. Stomach/Bowel: There is a moderate amount of stool in the colon. There is some dilatation of the gastric antrum and pylorus of unknown clinical significance. There is no evidence of a small-bowel obstruction. The appendix is not reliably identified on this exam. Vascular/Lymphatic: Aortic atherosclerosis. No enlarged abdominal or pelvic lymph nodes. The proximal right SFA is occluded. The proximal left SFA is likely also occluded but is not well evaluated on this exam. Reproductive: Status post hysterectomy. No adnexal masses. Other: There is a small amount of free fluid in the patient's pelvis of unknown clinical significance. Musculoskeletal: There are advanced degenerative changes of lumbar spine. Review of the MIP images confirms the above findings. IMPRESSION: 1. No PE identified. 2. Scattered tree-in-bud opacities bilaterally with areas of  bronchial wall thickening and mucus plugging is suspicious for infectious bronchiolitis. There is a small amount of atelectasis at the right lung base. 3. No acute intra-abdominal abnormality. 4. Dilatation of the distal pancreatic duct with an abrupt cutoff at the level of the pancreatic body. This is new since the patient's CT in 2016. An underlying mass cannot be excluded in this location. Follow-up with a nonemergent outpatient contrast enhanced  MRI is recommended for further evaluation of this finding. 5. Splenomegaly. 6.  Aortic Atherosclerosis (ICD10-I70.0). 7. There is a small amount of free fluid in the patient's pelvis with no identifiable cause. The appendix is not reliably identified on this exam. There is a moderate amount of stool throughout the colon. There is some distention of the gastric antrum of unknown clinical significance. Electronically Signed   By: Constance Holster M.D.   On: 07/09/2018 02:34   Dg Hand Complete Right  Result Date: 07/09/2018 CLINICAL DATA:  Pain status post fall EXAM: RIGHT HAND - COMPLETE 3+ VIEW COMPARISON:  None. FINDINGS: There is no acute displaced fracture or dislocation. There are advanced degenerative changes of the first carpometacarpal joint as well as the scaphoid trapezial joint there are degenerative changes throughout the interphalangeal joints as well as the metacarpophalangeal joints, greatest at the third digit. There is soft tissue swelling about the hand. There is nonspecific flexion at the of the metacarpophalangeal joints. IMPRESSION: 1. No acute displaced fracture or dislocation. 2. Degenerative changes as above. 3. Soft tissue swelling about the hand. Electronically Signed   By: Constance Holster M.D.   On: 07/09/2018 00:15   Ct Maxillofacial Wo Contrast  Result Date: 07/09/2018 CLINICAL DATA:  73 year old female with head trauma. EXAM: CT HEAD WITHOUT CONTRAST CT MAXILLOFACIAL WITHOUT CONTRAST CT CERVICAL SPINE WITHOUT CONTRAST TECHNIQUE: Multidetector CT imaging of the head, cervical spine, and maxillofacial structures were performed using the standard protocol without intravenous contrast. Multiplanar CT image reconstructions of the cervical spine and maxillofacial structures were also generated. COMPARISON:  Head CT dated 06/08/2018 FINDINGS: CT HEAD FINDINGS Brain: The ventricles and sulci appropriate size for patient's age. Confluent periventricular and centrum semiovale white  matter hypodensities most consistent with chronic microvascular ischemic changes or chronic white matter disease. There is no acute intracranial hemorrhage. No mass effect or midline shift. No extra-axial fluid collection. Vascular: No hyperdense vessel or unexpected calcification. Skull: No acute calvarial pathology. Scattered small calvarial lucencies similar to prior CT. Other: None CT MAXILLOFACIAL FINDINGS Osseous: No acute fracture. No mandibular subluxation. Degenerative changes of the TMJs bilaterally. Irregularity of the right nasal bone without overlying soft tissue swelling. This is similar to prior CT and consistent with an old fracture. Orbits: The globes and retro-orbital fat are preserved. Left cataract surgery. Sinuses: Complete opacification of the right maxillary sinus. The remainder of the visualized paranasal sinuses and mastoid air cells are clear. Soft tissues: Negative. CT CERVICAL SPINE FINDINGS Alignment: No acute subluxation. Grade 1 C7-T1 anterolisthesis. Skull base and vertebrae: No acute fracture. Osteopenia. Small sclerotic focus at T2, indeterminate, possibly a bone island. Soft tissues and spinal canal: No prevertebral fluid or swelling. No visible canal hematoma. Disc levels:  Multilevel degenerative changes. Upper chest: Atherosclerotic calcification of the aortic arch. Bilateral carotid bulb calcified plaques. Other: None IMPRESSION: 1. No acute intracranial hemorrhage. 2. No acute/traumatic cervical spine pathology. 3. No acute facial bone fractures. Electronically Signed   By: Anner Crete M.D.   On: 07/09/2018 02:18    Microbiology: Recent Results (from the past  240 hour(s))  Blood culture (routine x 2)     Status: None (Preliminary result)   Collection Time: 07/08/18 11:45 PM   Specimen: BLOOD RIGHT FOREARM  Result Value Ref Range Status   Specimen Description   Final    BLOOD RIGHT FOREARM Performed at [facility], [place] [address], [place], [place]  [address]    Special Requests   Final    BOTTLES DRAWN AEROBIC AND ANAEROBIC Blood Culture adequate volume Performed at [facility] [address] [place], [place] [address]    Culture PENDING  Incomplete   Report Status PENDING  Incomplete  SARS Coronavirus 2 (CEPHEID - Performed in [facility] hospital lab), Hosp Order     Status: None   Collection Time: 07/09/18  3:20 AM   Specimen: Nasopharyngeal Swab  Result Value Ref Range Status   SARS Coronavirus 2 NEGATIVE NEGATIVE Final    Comment: (NOTE) If result is NEGATIVE SARS-CoV-2 target nucleic acids are NOT DETECTED. The SARS-CoV-2 RNA is generally detectable in upper and lower  respiratory specimens during the acute phase of infection. The lowest  concentration of SARS-CoV-2 viral copies this assay can detect is 250  copies / mL. A negative result does not preclude SARS-CoV-2 infection  and should not be used as the sole basis for treatment or other  patient management decisions.  A negative result may occur with  improper specimen collection / handling, submission of specimen other  than nasopharyngeal swab, presence of viral mutation(s) within the  areas targeted by this assay, and inadequate number of viral copies  (<250 copies / mL). A negative result must be combined with clinical  observations, patient history, and epidemiological information. If result is POSITIVE SARS-CoV-2 target nucleic acids are DETECTED. The SARS-CoV-2 RNA is generally detectable in upper and lower  respiratory specimens dur ing the acute phase of infection.  Positive  results are indicative of active infection with SARS-CoV-2.  Clinical  correlation with patient history and other diagnostic information is  necessary to determine patient infection status.  Positive results do  not rule out bacterial infection or co-infection with other viruses. If result is PRESUMPTIVE POSTIVE SARS-CoV-2 nucleic acids MAY BE PRESENT.   A  presumptive positive result was obtained on the submitted specimen  and confirmed on repeat testing.  While 2019 novel coronavirus  (SARS-CoV-2) nucleic acids may be present in the submitted sample  additional confirmatory testing may be necessary for epidemiological  and / or clinical management purposes  to differentiate between  SARS-CoV-2 and other Sarbecovirus currently known to infect humans.  If clinically indicated additional testing with an alternate test  methodology [id number]) is advised. The SARS-CoV-2 RNA is generally  detectable in upper and lower respiratory sp ecimens during the acute  phase of infection. The expected result is Negative. Fact Sheet for Patients:  StrictlyIdeas.no Fact Sheet for Healthcare Providers: BankingDealers.co.za This test is not yet approved or cleared by the [place] FDA and has been authorized for detection and/or diagnosis of SARS-CoV-2 by FDA under an Emergency Use Authorization (EUA).  This EUA will remain in effect (meaning this test can be used) for the duration of the COVID-19 declaration under Section 564(b)(1) of the Act, 21 U.S.C. section 360bbb-3(b)(1), unless the authorization is terminated or revoked sooner. Performed at [facility], [place] [address], [place], [place] [address]   Respiratory Panel by PCR     Status: None   Collection Time: 07/09/18 12:22 PM  Specimen: Nasopharyngeal Swab; Respiratory  Result Value Ref Range Status   Adenovirus NOT DETECTED NOT DETECTED Final   Coronavirus 229E NOT DETECTED NOT DETECTED Final    Comment: (NOTE) The Coronavirus on the Respiratory Panel, DOES NOT test for the novel  Coronavirus (2019 nCoV)    Coronavirus HKU1 NOT DETECTED NOT DETECTED Final   Coronavirus NL63 NOT DETECTED NOT DETECTED Final   Coronavirus OC43 NOT DETECTED NOT DETECTED Final   Metapneumovirus NOT DETECTED NOT DETECTED Final   Rhinovirus /  Enterovirus NOT DETECTED NOT DETECTED Final   Influenza A NOT DETECTED NOT DETECTED Final   Influenza B NOT DETECTED NOT DETECTED Final   Parainfluenza Virus 1 NOT DETECTED NOT DETECTED Final   Parainfluenza Virus 2 NOT DETECTED NOT DETECTED Final   Parainfluenza Virus 3 NOT DETECTED NOT DETECTED Final   Parainfluenza Virus 4 NOT DETECTED NOT DETECTED Final   Respiratory Syncytial Virus NOT DETECTED NOT DETECTED Final   Bordetella pertussis NOT DETECTED NOT DETECTED Final   Chlamydophila pneumoniae NOT DETECTED NOT DETECTED Final   Mycoplasma pneumoniae NOT DETECTED NOT DETECTED Final    Comment: Performed at [facility], [place]. [address] [place], [place] [address]     Labs: Basic Metabolic Panel: Recent Labs  Lab 07/08/18 2345 07/09/18 0437  NA 137 136  K 3.8 3.9  CL 100 101  CO2 26 28  GLUCOSE 122* 111*  BUN 9 8  CREATININE 0.95 0.79  CALCIUM 8.6* 8.3*   Liver Function Tests: Recent Labs  Lab 07/08/18 2345 07/09/18 0437  AST 13* 12*  ALT 6 7  ALKPHOS 99 90  BILITOT 0.4 0.2*  PROT 6.4* 5.9*  ALBUMIN 3.4* 3.3*   No results for input(s): LIPASE, AMYLASE in the last 168 hours. No results for input(s): AMMONIA in the last 168 hours. CBC: Recent Labs  Lab 07/08/18 2345 07/09/18 0437  WBC 7.8 5.5  HGB 10.5* 9.9*  HCT 32.3* 31.8*  MCV 90.5 91.6  PLT 216 204   Cardiac Enzymes: Recent Labs  Lab 07/09/18 0437  CKTOTAL 75  CKMB 4.2   BNP: BNP (last 3 results) Recent Labs    03/25/18 2358 07/08/18 2345  BNP 1,025.1* 147.3*    ProBNP (last 3 results) No results for input(s): PROBNP in the last 8760 hours.  CBG: No results for input(s): GLUCAP in the last 168 hours.     Signed:  Irine Seal MD.  [facility] 07/09/2018, 3:42 PM

## 2018-07-09 NOTE — ED Notes (Signed)
ED TO INPATIENT HANDOFF REPORT  ED Nurse Name and Phone #: [facility]on [facility]   S Name/Age/Gender Allison Vaughan 73 y.o. female Room/Bed: WA12/WA12  Code Status   Code Status: Prior  Home/SNF/Other Home {Patient oriented alert x4  Is this baseline? Yes  Triage Complete: Triage complete  Chief Complaint dizziness; arm pain  Triage Note Per EMS, Pt is coming from home. Pt called EMS for low O2 saturation, dizziness for 2x days, and pain in the right wrist. Pt A&O 4x, however EMS stated that her responses were delayed. Pt does take blood thinner. Pt had a witnessed fall 2x days ago. HTX of afib, and COPD.    Allergies No Known Allergies  Level of Care/Admitting Diagnosis ED Disposition    ED Disposition Condition Comment   Admit  Hospital Area: [facility] [[id number]]  Level of Care: Telemetry [5]  Admit to tele based on following criteria: Monitor for Ischemic changes  Covid Evaluation: Person Under Investigation (PUI)  Isolation Risk Level: Low Risk/Droplet (Less than 4L [place] supplementation)  Diagnosis: Altered mental status [780.97.ICD-9-CM]  Admitting Physician: [name] [[id number]]  Attending Physician: [name] [[id number]]  PT Class (Do Not Modify): Observation [104]  PT Acc Code (Do Not Modify): Observation [10022]       B Medical/Surgery History Past Medical History:  Diagnosis Date  . Accidental drug overdose   . Acute encephalopathy   . Acute on chronic respiratory failure with hypoxia ([place]) 03/01/2017  . Acute renal failure ([place]) 04/16/2014  . AKI (acute kidney injury) ([place]) 04/30/2014  . Altered mental status 06/27/2012  . Anxiety   . Atrial fibrillation ([place])   . CAD (coronary artery disease)   . CAP (community acquired pneumonia) 04/18/2014  . Cardiomyopathy ([place])   . Chronic hypoxemic respiratory failure ([place]) 06/26/2012  . Chronic pain   . COPD exacerbation ([place]) 06/26/2012  . COPD with acute exacerbation ([place]) 03/01/2017  . DEPRESSIVE DISORDER,  NOS 03/13/2006   Qualifier: Diagnosis of  By: Damita Dunnings MD, Phillip Heal    . Edema, peripheral   . Essential hypertension 04/30/2014  . Fall at home, initial encounter 03/01/2017  . GASTROESOPHAGEAL REFLUX, NO ESOPHAGITIS 03/13/2006   Qualifier: Diagnosis of  By: Damita Dunnings MD, Phillip Heal    . HYPERCHOLESTEROLEMIA 03/13/2006   Qualifier: Diagnosis of  By: Damita Dunnings MD, Phillip Heal    . Hyperlipidemia   . HYPERTENSION, BENIGN SYSTEMIC 03/13/2006   Qualifier: Diagnosis of  By: Damita Dunnings MD, Phillip Heal    . Hypokalemia 04/30/2014  . Hyponatremia 04/30/2014  . Hypotension 04/30/2014  . Hypoxemia 06/26/2012  . Hypoxia 03/26/2018  . Irritable bowel syndrome 03/13/2006   Qualifier: Diagnosis of  By: Damita Dunnings MD, Phillip Heal    . Lung nodule 04/16/2014  . MENOPAUSAL SYNDROME 03/13/2006   Qualifier: Diagnosis of  By: Damita Dunnings MD, Phillip Heal    . Mood disorder [facility]) 03/13/2006   Qualifier: Diagnosis of  By: Damita Dunnings MD, Phillip Heal    . 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 Nausea 04/30/2014  . Nausea vomiting and diarrhea 04/16/2014  . OSTEOARTHRITIS, MULTI SITES 03/13/2006   Qualifier: Diagnosis of  By: Damita Dunnings MD, Phillip Heal    . Pulmonary nodule 04/30/2014  . Respiratory failure ([place]) 01/22/2017  . TOBACCO DEPENDENCE 03/13/2006   Qualifier: Diagnosis of  By: Damita Dunnings MD, Phillip Heal    . Weight loss    Past Surgical History:  Procedure Laterality Date  . EYE SURGERY    . ROTATOR CUFF REPAIR Right 2012     A IV  Location/Drains/Wounds Patient Lines/Drains/Airways Status   Active Line/Drains/Airways    Name:   Placement date:   Placement time:   Site:   Days:   Peripheral IV 07/08/18 Anterior;Left Forearm   07/08/18    2019    Forearm   1          Intake/Output Last 24 hours No intake or output data in the 24 hours ending 07/09/18 0315  Labs/Imaging Results for orders placed or performed during the hospital encounter of 07/08/18 (from the past 48 hour(s))  Blood gas, venous (at [facility] and [facility], not at [facility])     Status: Abnormal   Collection Time: 07/08/18  11:22 PM  Result Value Ref Range   pH, Ven 7.258 7.250 - 7.430   pCO2, Ven 68.6 (H) 44.0 - 60.0 mmHg   pO2, Ven 45.8 (H) 32.0 - 45.0 mmHg   Bicarbonate 29.6 (H) 20.0 - 28.0 mmol/L   Acid-Base Excess 1.1 0.0 - 2.0 mmol/L   O2 Saturation 76.5 %   Patient temperature 98.6    Collection site VEIN    Drawn by DRAWN BY RN    Sample type VENOUS     Comment: Performed at [facility], [place] [address], [place], [place] [address]  Urinalysis, Routine w reflex microscopic     Status: Abnormal   Collection Time: 07/08/18 11:30 PM  Result Value Ref Range   Color, Urine STRAW (A) YELLOW   APPearance CLEAR CLEAR   Specific Gravity, Urine 1.003 (L) 1.005 - 1.030   pH 6.0 5.0 - 8.0   Glucose, UA NEGATIVE NEGATIVE mg/dL   Hgb urine dipstick NEGATIVE NEGATIVE   Bilirubin Urine NEGATIVE NEGATIVE   Ketones, ur NEGATIVE NEGATIVE mg/dL   Protein, ur NEGATIVE NEGATIVE mg/dL   Nitrite NEGATIVE NEGATIVE   Leukocytes,Ua SMALL (A) NEGATIVE   RBC / HPF 0-5 0 - 5 RBC/hpf   WBC, UA 6-10 0 - 5 WBC/hpf   Bacteria, UA NONE SEEN NONE SEEN   Squamous Epithelial / LPF 0-5 0 - 5    Comment: Performed at [facility], [place] [address] [place], [place] [address]  Basic metabolic panel     Status: Abnormal   Collection Time: 07/08/18 11:45 PM  Result Value Ref Range   Sodium 137 135 - 145 mmol/L   Potassium 3.8 3.5 - 5.1 mmol/L   Chloride 100 98 - 111 mmol/L   CO2 26 22 - 32 mmol/L   Glucose, Bld 122 (H) 70 - 99 mg/dL   BUN 9 8 - 23 mg/dL   Creatinine, Ser 0.95 0.44 - 1.00 mg/dL   Calcium 8.6 (L) 8.9 - 10.3 mg/dL   GFR calc non Af Amer 59 (L) >60 mL/min   GFR calc Af Amer >60 >60 mL/min   Anion gap 11 5 - 15    Comment: Performed at [facility], [place] [address] [place], [place] [address]  CBC     Status: Abnormal   Collection Time: 07/08/18 11:45 PM  Result Value Ref Range   WBC 7.8 4.0 - 10.5 K/uL   RBC 3.57 (L) 3.87 - 5.11 MIL/uL   Hemoglobin 10.5  (L) 12.0 - 15.0 g/dL   HCT 32.3 (L) 36.0 - 46.0 %   MCV 90.5 80.0 - 100.0 fL   MCH 29.4 26.0 - 34.0 pg   MCHC 32.5 30.0 - 36.0 g/dL   RDW 13.6 11.5 - 15.5 %   Platelets 216 150 - 400 K/uL  nRBC 0.0 0.0 - 0.2 %    Comment: Performed at [facility], [place] [address] [place], [place] [address]  Troponin I (High Sensitivity)     Status: Abnormal   Collection Time: 07/08/18 11:45 PM  Result Value Ref Range   Troponin I (High Sensitivity) 23.1 (H) <18 ng/L    Comment: (NOTE) Elevated high sensitivity troponin I (hsTnI) values and significant  changes across serial measurements may suggest ACS but many other  chronic and acute conditions are known to elevate hsTnI results.  Refer to the "Links" section for chest pain algorithms and additional  guidance. Performed at [facility], [place] [address], [place], [place] [address]   D-dimer, quantitative (not at [facility])     Status: Abnormal   Collection Time: 07/08/18 11:45 PM  Result Value Ref Range   D-Dimer, Quant 1.33 (H) 0.00 - 0.50 ug/mL-FEU    Comment: (NOTE) At the manufacturer cut-off of 0.50 ug/mL FEU, this assay has been documented to exclude PE with a sensitivity and negative predictive value of 97 to 99%.  At this time, this assay has not been approved by the FDA to exclude DVT/VTE. Results should be correlated with clinical presentation. Performed at [facility], [place] [address], [place], [place] [address]   Brain natriuretic peptide     Status: Abnormal   Collection Time: 07/08/18 11:45 PM  Result Value Ref Range   B Natriuretic Peptide 147.3 (H) 0.0 - 100.0 pg/mL    Comment: Performed at [facility], [place] [address] [place], [place] [address]  Lactic acid, plasma     Status: None   Collection Time: 07/08/18 11:45 PM  Result Value Ref Range   Lactic Acid, Venous 0.6 0.5 - 1.9 mmol/L    Comment: Performed at [facility], [place]  [address], [place], [place] [address]  Blood culture (routine x 2)     Status: None (Preliminary result)   Collection Time: 07/08/18 11:45 PM   Specimen: BLOOD RIGHT FOREARM  Result Value Ref Range   Specimen Description      BLOOD RIGHT FOREARM Performed at [facility], [place] [address], [place], [place] [address]    Special Requests      BOTTLES DRAWN AEROBIC AND ANAEROBIC Blood Culture adequate volume Performed at [place] [address], [place], [place] [address]    Culture PENDING    Report Status PENDING   Hepatic function panel     Status: Abnormal   Collection Time: 07/08/18 11:45 PM  Result Value Ref Range   Total Protein 6.4 (L) 6.5 - 8.1 g/dL   Albumin 3.4 (L) 3.5 - 5.0 g/dL   AST 13 (L) 15 - 41 U/L   ALT 6 0 - 44 U/L   Alkaline Phosphatase 99 38 - 126 U/L   Total Bilirubin 0.4 0.3 - 1.2 mg/dL   Bilirubin, Direct 0.1 0.0 - 0.2 mg/dL   Indirect Bilirubin 0.3 0.3 - 0.9 mg/dL    Comment: Performed at [facility], [place] [address], [place],  [address]   Dg Wrist Complete Right  Result Date: 07/08/2018 CLINICAL DATA:  Pain following fall EXAM: RIGHT WRIST - COMPLETE 3+ VIEW COMPARISON:  None. FINDINGS: Frontal, oblique, lateral, and ulnar deviation scaphoid images were obtained. There is no acute fracture or dislocation. There is marked osteoarthritic change in the first carpal-metacarpal and scaphotrapezial joints. There is heterotopic bone lateral to the trapezium bone.  There is remodeling of the trapezium bone. There is milder osteoarthritic change in the radiocarpal joint. There is osteoarthritic change in all MCP joints. No bony destruction or erosion. IMPRESSION: Multifocal arthropathy, most marked in the first carpal-metacarpal and scaphotrapezial joints. There is heterotopic bone which may represent a degree of myositis ossificans lateral to the trapezium bone. No acute fracture or dislocation. Electronically Signed    By: Lowella Grip III M.D.   On: 07/08/2018 21:15   Ct Head Wo Contrast  Result Date: 07/09/2018 CLINICAL DATA:  73 year old female with head trauma. EXAM: CT HEAD WITHOUT CONTRAST CT MAXILLOFACIAL WITHOUT CONTRAST CT CERVICAL SPINE WITHOUT CONTRAST TECHNIQUE: Multidetector CT imaging of the head, cervical spine, and maxillofacial structures were performed using the standard protocol without intravenous contrast. Multiplanar CT image reconstructions of the cervical spine and maxillofacial structures were also generated. COMPARISON:  Head CT dated 06/08/2018 FINDINGS: CT HEAD FINDINGS Brain: The ventricles and sulci appropriate size for patient's age. Confluent periventricular and centrum semiovale white matter hypodensities most consistent with chronic microvascular ischemic changes or chronic white matter disease. There is no acute intracranial hemorrhage. No mass effect or midline shift. No extra-axial fluid collection. Vascular: No hyperdense vessel or unexpected calcification. Skull: No acute calvarial pathology. Scattered small calvarial lucencies similar to prior CT. Other: None CT MAXILLOFACIAL FINDINGS Osseous: No acute fracture. No mandibular subluxation. Degenerative changes of the TMJs bilaterally. Irregularity of the right nasal bone without overlying soft tissue swelling. This is similar to prior CT and consistent with an old fracture. Orbits: The globes and retro-orbital fat are preserved. Left cataract surgery. Sinuses: Complete opacification of the right maxillary sinus. The remainder of the visualized paranasal sinuses and mastoid air cells are clear. Soft tissues: Negative. CT CERVICAL SPINE FINDINGS Alignment: No acute subluxation. Grade 1 C7-T1 anterolisthesis. Skull base and vertebrae: No acute fracture. Osteopenia. Small sclerotic focus at T2, indeterminate, possibly a bone island. Soft tissues and spinal canal: No prevertebral fluid or swelling. No visible canal hematoma. Disc levels:   Multilevel degenerative changes. Upper chest: Atherosclerotic calcification of the aortic arch. Bilateral carotid bulb calcified plaques. Other: None IMPRESSION: 1. No acute intracranial hemorrhage. 2. No acute/traumatic cervical spine pathology. 3. No acute facial bone fractures. Electronically Signed   By: Anner Crete M.D.   On: 07/09/2018 02:18   Ct Angio Chest Pe W And/or Wo Contrast  Result Date: 07/09/2018 CLINICAL DATA:  Positive D-dimer with PE suspected. History of AFib and COPD. EXAM: CT ANGIOGRAPHY CHEST CT ABDOMEN AND PELVIS WITH CONTRAST TECHNIQUE: Multidetector CT imaging of the chest was performed using the standard protocol during bolus administration of intravenous contrast. Multiplanar CT image reconstructions and MIPs were obtained to evaluate the vascular anatomy. Multidetector CT imaging of the abdomen and pelvis was performed using the standard protocol during bolus administration of intravenous contrast. CONTRAST:  25mL OMNIPAQUE IOHEXOL 350 MG/ML SOLN COMPARISON:  CT chest dated May 01, 2014. FINDINGS: CTA CHEST FINDINGS Cardiovascular: Satisfactory opacification of the pulmonary arteries to the segmental level. No evidence of pulmonary embolism. Normal heart size. No pericardial effusion. Aortic calcifications are noted. Coronary artery calcifications are noted. Mediastinum/Nodes: No enlarged mediastinal, hilar, or axillary lymph nodes. Thyroid gland, trachea, and esophagus demonstrate no significant findings. Lungs/Pleura: There are few small scattered pulmonary nodules bilaterally. These appear stable from prior studies. There are a few tree-in-bud opacities in the right upper lobe. There is some bronchial wall thickening and mucus plugging at the lung bases bilaterally. There is no large pleural effusion.  No pneumothorax. There is atelectasis at the right lung base. Musculoskeletal: No chest wall abnormality. No acute or significant osseous findings. Review of the MIP images  confirms the above findings. CT ABDOMEN and PELVIS FINDINGS Hepatobiliary: No focal liver abnormality is seen. No gallstones, gallbladder wall thickening, or biliary dilatation. Pancreas: There is significant dilatation of the distal pancreatic duct which appears new since prior CT in 2016. There is an ill-defined soft tissue density in the pancreatic body. The distal pancreatic duct is unremarkable. The common bile duct is not significantly dilated. Spleen: The spleen is significantly enlarged measuring approximately 12.5 cm craniocaudad. Adrenals/Urinary Tract: There are multiple nonobstructing stones involving both kidneys. There is no hydronephrosis. The adrenal glands are unremarkable. The bladder is moderately distended. Stomach/Bowel: There is a moderate amount of stool in the colon. There is some dilatation of the gastric antrum and pylorus of unknown clinical significance. There is no evidence of a small-bowel obstruction. The appendix is not reliably identified on this exam. Vascular/Lymphatic: Aortic atherosclerosis. No enlarged abdominal or pelvic lymph nodes. The proximal right SFA is occluded. The proximal left SFA is likely also occluded but is not well evaluated on this exam. Reproductive: Status post hysterectomy. No adnexal masses. Other: There is a small amount of free fluid in the patient's pelvis of unknown clinical significance. Musculoskeletal: There are advanced degenerative changes of lumbar spine. Review of the MIP images confirms the above findings. IMPRESSION: 1. No PE identified. 2. Scattered tree-in-bud opacities bilaterally with areas of bronchial wall thickening and mucus plugging is suspicious for infectious bronchiolitis. There is a small amount of atelectasis at the right lung base. 3. No acute intra-abdominal abnormality. 4. Dilatation of the distal pancreatic duct with an abrupt cutoff at the level of the pancreatic body. This is new since the patient's CT in 2016. An underlying  mass cannot be excluded in this location. Follow-up with a nonemergent outpatient contrast enhanced MRI is recommended for further evaluation of this finding. 5. Splenomegaly. 6.  Aortic Atherosclerosis (ICD10-I70.0). 7. There is a small amount of free fluid in the patient's pelvis with no identifiable cause. The appendix is not reliably identified on this exam. There is a moderate amount of stool throughout the colon. There is some distention of the gastric antrum of unknown clinical significance. Electronically Signed   By: Constance Holster M.D.   On: 07/09/2018 02:34   Ct Cervical Spine Wo Contrast  Result Date: 07/09/2018 CLINICAL DATA:  73 year old female with head trauma. EXAM: CT HEAD WITHOUT CONTRAST CT MAXILLOFACIAL WITHOUT CONTRAST CT CERVICAL SPINE WITHOUT CONTRAST TECHNIQUE: Multidetector CT imaging of the head, cervical spine, and maxillofacial structures were performed using the standard protocol without intravenous contrast. Multiplanar CT image reconstructions of the cervical spine and maxillofacial structures were also generated. COMPARISON:  Head CT dated 06/08/2018 FINDINGS: CT HEAD FINDINGS Brain: The ventricles and sulci appropriate size for patient's age. Confluent periventricular and centrum semiovale white matter hypodensities most consistent with chronic microvascular ischemic changes or chronic white matter disease. There is no acute intracranial hemorrhage. No mass effect or midline shift. No extra-axial fluid collection. Vascular: No hyperdense vessel or unexpected calcification. Skull: No acute calvarial pathology. Scattered small calvarial lucencies similar to prior CT. Other: None CT MAXILLOFACIAL FINDINGS Osseous: No acute fracture. No mandibular subluxation. Degenerative changes of the TMJs bilaterally. Irregularity of the right nasal bone without overlying soft tissue swelling. This is similar to prior CT and consistent with an old fracture. Orbits: The globes and  retro-orbital fat  are preserved. Left cataract surgery. Sinuses: Complete opacification of the right maxillary sinus. The remainder of the visualized paranasal sinuses and mastoid air cells are clear. Soft tissues: Negative. CT CERVICAL SPINE FINDINGS Alignment: No acute subluxation. Grade 1 C7-T1 anterolisthesis. Skull base and vertebrae: No acute fracture. Osteopenia. Small sclerotic focus at T2, indeterminate, possibly a bone island. Soft tissues and spinal canal: No prevertebral fluid or swelling. No visible canal hematoma. Disc levels:  Multilevel degenerative changes. Upper chest: Atherosclerotic calcification of the aortic arch. Bilateral carotid bulb calcified plaques. Other: None IMPRESSION: 1. No acute intracranial hemorrhage. 2. No acute/traumatic cervical spine pathology. 3. No acute facial bone fractures. Electronically Signed   By: Anner Crete M.D.   On: 07/09/2018 02:18   Ct Abdomen Pelvis W Contrast  Result Date: 07/09/2018 CLINICAL DATA:  Positive D-dimer with PE suspected. History of AFib and COPD. EXAM: CT ANGIOGRAPHY CHEST CT ABDOMEN AND PELVIS WITH CONTRAST TECHNIQUE: Multidetector CT imaging of the chest was performed using the standard protocol during bolus administration of intravenous contrast. Multiplanar CT image reconstructions and MIPs were obtained to evaluate the vascular anatomy. Multidetector CT imaging of the abdomen and pelvis was performed using the standard protocol during bolus administration of intravenous contrast. CONTRAST:  38mL OMNIPAQUE IOHEXOL 350 MG/ML SOLN COMPARISON:  CT chest dated May 01, 2014. FINDINGS: CTA CHEST FINDINGS Cardiovascular: Satisfactory opacification of the pulmonary arteries to the segmental level. No evidence of pulmonary embolism. Normal heart size. No pericardial effusion. Aortic calcifications are noted. Coronary artery calcifications are noted. Mediastinum/Nodes: No enlarged mediastinal, hilar, or axillary lymph nodes. Thyroid gland,  trachea, and esophagus demonstrate no significant findings. Lungs/Pleura: There are few small scattered pulmonary nodules bilaterally. These appear stable from prior studies. There are a few tree-in-bud opacities in the right upper lobe. There is some bronchial wall thickening and mucus plugging at the lung bases bilaterally. There is no large pleural effusion. No pneumothorax. There is atelectasis at the right lung base. Musculoskeletal: No chest wall abnormality. No acute or significant osseous findings. Review of the MIP images confirms the above findings. CT ABDOMEN and PELVIS FINDINGS Hepatobiliary: No focal liver abnormality is seen. No gallstones, gallbladder wall thickening, or biliary dilatation. Pancreas: There is significant dilatation of the distal pancreatic duct which appears new since prior CT in 2016. There is an ill-defined soft tissue density in the pancreatic body. The distal pancreatic duct is unremarkable. The common bile duct is not significantly dilated. Spleen: The spleen is significantly enlarged measuring approximately 12.5 cm craniocaudad. Adrenals/Urinary Tract: There are multiple nonobstructing stones involving both kidneys. There is no hydronephrosis. The adrenal glands are unremarkable. The bladder is moderately distended. Stomach/Bowel: There is a moderate amount of stool in the colon. There is some dilatation of the gastric antrum and pylorus of unknown clinical significance. There is no evidence of a small-bowel obstruction. The appendix is not reliably identified on this exam. Vascular/Lymphatic: Aortic atherosclerosis. No enlarged abdominal or pelvic lymph nodes. The proximal right SFA is occluded. The proximal left SFA is likely also occluded but is not well evaluated on this exam. Reproductive: Status post hysterectomy. No adnexal masses. Other: There is a small amount of free fluid in the patient's pelvis of unknown clinical significance. Musculoskeletal: There are advanced  degenerative changes of lumbar spine. Review of the MIP images confirms the above findings. IMPRESSION: 1. No PE identified. 2. Scattered tree-in-bud opacities bilaterally with areas of bronchial wall thickening and mucus plugging is suspicious for infectious bronchiolitis. There is a  small amount of atelectasis at the right lung base. 3. No acute intra-abdominal abnormality. 4. Dilatation of the distal pancreatic duct with an abrupt cutoff at the level of the pancreatic body. This is new since the patient's CT in 2016. An underlying mass cannot be excluded in this location. Follow-up with a nonemergent outpatient contrast enhanced MRI is recommended for further evaluation of this finding. 5. Splenomegaly. 6.  Aortic Atherosclerosis (ICD10-I70.0). 7. There is a small amount of free fluid in the patient's pelvis with no identifiable cause. The appendix is not reliably identified on this exam. There is a moderate amount of stool throughout the colon. There is some distention of the gastric antrum of unknown clinical significance. Electronically Signed   By: Constance Holster M.D.   On: 07/09/2018 02:34   Dg Hand Complete Right  Result Date: 07/09/2018 CLINICAL DATA:  Pain status post fall EXAM: RIGHT HAND - COMPLETE 3+ VIEW COMPARISON:  None. FINDINGS: There is no acute displaced fracture or dislocation. There are advanced degenerative changes of the first carpometacarpal joint as well as the scaphoid trapezial joint there are degenerative changes throughout the interphalangeal joints as well as the metacarpophalangeal joints, greatest at the third digit. There is soft tissue swelling about the hand. There is nonspecific flexion at the of the metacarpophalangeal joints. IMPRESSION: 1. No acute displaced fracture or dislocation. 2. Degenerative changes as above. 3. Soft tissue swelling about the hand. Electronically Signed   By: Constance Holster M.D.   On: 07/09/2018 00:15   Ct Maxillofacial Wo  Contrast  Result Date: 07/09/2018 CLINICAL DATA:  73 year old female with head trauma. EXAM: CT HEAD WITHOUT CONTRAST CT MAXILLOFACIAL WITHOUT CONTRAST CT CERVICAL SPINE WITHOUT CONTRAST TECHNIQUE: Multidetector CT imaging of the head, cervical spine, and maxillofacial structures were performed using the standard protocol without intravenous contrast. Multiplanar CT image reconstructions of the cervical spine and maxillofacial structures were also generated. COMPARISON:  Head CT dated 06/08/2018 FINDINGS: CT HEAD FINDINGS Brain: The ventricles and sulci appropriate size for patient's age. Confluent periventricular and centrum semiovale white matter hypodensities most consistent with chronic microvascular ischemic changes or chronic white matter disease. There is no acute intracranial hemorrhage. No mass effect or midline shift. No extra-axial fluid collection. Vascular: No hyperdense vessel or unexpected calcification. Skull: No acute calvarial pathology. Scattered small calvarial lucencies similar to prior CT. Other: None CT MAXILLOFACIAL FINDINGS Osseous: No acute fracture. No mandibular subluxation. Degenerative changes of the TMJs bilaterally. Irregularity of the right nasal bone without overlying soft tissue swelling. This is similar to prior CT and consistent with an old fracture. Orbits: The globes and retro-orbital fat are preserved. Left cataract surgery. Sinuses: Complete opacification of the right maxillary sinus. The remainder of the visualized paranasal sinuses and mastoid air cells are clear. Soft tissues: Negative. CT CERVICAL SPINE FINDINGS Alignment: No acute subluxation. Grade 1 C7-T1 anterolisthesis. Skull base and vertebrae: No acute fracture. Osteopenia. Small sclerotic focus at T2, indeterminate, possibly a bone island. Soft tissues and spinal canal: No prevertebral fluid or swelling. No visible canal hematoma. Disc levels:  Multilevel degenerative changes. Upper chest: Atherosclerotic  calcification of the aortic arch. Bilateral carotid bulb calcified plaques. Other: None IMPRESSION: 1. No acute intracranial hemorrhage. 2. No acute/traumatic cervical spine pathology. 3. No acute facial bone fractures. Electronically Signed   By: Anner Crete M.D.   On: 07/09/2018 02:18    Pending Labs Unresulted Labs (From admission, onward)    Start     Ordered   07/08/18 2309  Urine Culture  Once,   STAT     07/08/18 2308   07/08/18 2308  Lactic acid, plasma  Now then every 2 hours,   STAT     07/08/18 2308   07/08/18 2308  Blood culture (routine x 2)  BLOOD CULTURE X 2,   STAT     07/08/18 2308   07/08/18 2307  SARS Coronavirus 2 (CEPHEID - Performed in [place] hospital lab), Hosp Order  (Asymptomatic Patients Labs)  Once,   STAT    Question:  Rule Out  Answer:  Yes   07/08/18 2306   07/08/18 2300  Troponin I (High Sensitivity)  STAT Now then every 2 hours,   R (with STAT occurrences)    Question:  Indication  Answer:  Other   07/08/18 2259          Vitals/Pain Today's Vitals   07/08/18 2029 07/08/18 2331 07/08/18 2338 07/09/18 0203  BP:   136/66 (!) 145/98  Pulse:   74 70  Resp:   18 18  Temp:  99.4 F (37.4 C)    TempSrc:  Rectal    SpO2:   96% 96%  Weight: 44.5 kg     Height: 5\' 1"  (1.549 m)     PainSc:        Isolation Precautions No active isolations  Medications Medications  sodium chloride (PF) 0.9 % injection (has no administration in time range)  azithromycin (ZITHROMAX) 500 mg in sodium chloride 0.9 % 250 mL IVPB (has no administration in time range)  sodium chloride 0.9 % bolus 1,000 mL (1,000 mLs Intravenous New Bag/Given 07/08/18 2343)  iohexol (OMNIPAQUE) 350 MG/ML injection 100 mL (80 mLs Intravenous Contrast Given 07/09/18 0137)    Mobility walks with person assist Low fall risk   Focused Assessments Pulmonary Assessment Handoff:  Lung sounds: L Breath Sounds: Clear R Breath Sounds: Diminished O2 Device: Nasal Cannula O2 Flow Rate  (L/min): 2 L/min      R Recommendations: See Admitting Provider Note  Report given to:   Additional Notes:

## 2018-07-09 NOTE — Progress Notes (Signed)
PT Cancellation Note  Patient Details Name: MONTY MCCARRELL MRN: [id number] DOB: 17-Nov-1945   Cancelled Treatment:    Reason Eval/Treat Not Completed: Patient at procedure or test/unavailable(MRI)   York Ram E 07/09/2018, 10:36 AM Carmelia Bake, PT, DPT Acute Rehabilitation Services Office: [phone] Pager: [phone]

## 2018-07-09 NOTE — Evaluation (Signed)
Physical Therapy Evaluation Patient Details Name: Allison Vaughan MRN: [id number] DOB: 04-07-1945 Today's Date: 07/09/2018   History of Present Illness  73 y.o. female with hx of Copd, hypertension, hyperlipidemia,  Chronic systolic CHF, Pafib, chronic pain, apparently presented to ED after fall 2 days ago, with right wrist pain  Clinical Impression  Pt admitted with above diagnosis. Pt currently with functional limitations due to the deficits listed below (see PT Problem List).  Pt will benefit from skilled PT to increase their independence and safety with mobility to allow discharge to the venue listed below.  Pt reports 2 falls prior to admission and does not recall cause of falls.  Pt does state she was getting OOB quickly when falls occurred.  Orthostatics obtained and in docflowsheets.  Pt educated to wait after positioning changes and slowly mobilize. Pt also agreeable to use RW upon d/c.  Pt declines HHPT at this time.     Follow Up Recommendations Home health PT;Supervision for mobility/OOB    Equipment Recommendations  None recommended by PT    Recommendations for Other Services       Precautions / Restrictions Precautions Precautions: Fall      Mobility  Bed Mobility Overal bed mobility: Modified Independent                Transfers Overall transfer level: Needs assistance Equipment used: Rolling walker (2 wheeled) Transfers: Sit to/from Stand Sit to Stand: Min guard         General transfer comment: min/guard for safety, cues for hand placement  Ambulation/Gait Ambulation/Gait assistance: Min guard Gait Distance (Feet): 160 Feet Assistive device: Rolling walker (2 wheeled) Gait Pattern/deviations: Step-through pattern;Decreased stride length     General Gait Details: verbal cues for safe use of RW, denies dizziness  Stairs            Wheelchair Mobility    Modified Rankin (Stroke Patients Only)       Balance Overall balance  assessment: History of Falls                                           Pertinent Vitals/Pain Pain Assessment: Faces Faces Pain Scale: Hurts little more Pain Location: R side of body (s/p fall at home, edematous right hand, ecchymosis of right orbital area, and pain reported down lateral right leg) Pain Descriptors / Indicators: Sore Pain Intervention(s): Limited activity within patient's tolerance;Repositioned;Monitored during session    Home Living Family/patient expects to be discharged to:: Private residence Living Arrangements: Children;Non-relatives/Friends Available Help at Discharge: Family;Available 24 hours/day Type of Home: House Home Access: Level entry     Home Layout: One level Home Equipment: Grab bars - toilet;Grab bars - tub/shower;Shower seat;Walker - 2 wheels      Prior Function Level of Independence: Needs assistance   Gait / Transfers Assistance Needed: pt reports she was ambulatory without AD  ADL's / Homemaking Assistance Needed: pt required assistance from her daughter for all ADL  Comments: pt reports previous falls     Hand Dominance        Extremity/Trunk Assessment        Lower Extremity Assessment Lower Extremity Assessment: Generalized weakness    Cervical / Trunk Assessment Cervical / Trunk Assessment: Normal  Communication   Communication: No difficulties  Cognition Arousal/Alertness: Awake/alert Behavior During Therapy: WFL for tasks assessed/performed Overall Cognitive Status: Within Functional Limits  for tasks assessed                                        General Comments      Exercises     Assessment/Plan    PT Assessment Patient needs continued PT services  PT Problem List Decreased strength;Decreased mobility;Decreased balance;Decreased activity tolerance;Decreased knowledge of use of DME       PT Treatment Interventions DME instruction;Therapeutic activities;Gait  training;Therapeutic exercise;Patient/family education;Functional mobility training;Stair training;Balance training    PT Goals (Current goals can be found in the Care Plan section)  Acute Rehab PT Goals PT Goal Formulation: With patient Time For Goal Achievement: 07/16/18 Potential to Achieve Goals: Good    Frequency Min 3X/week   Barriers to discharge        Co-evaluation               AM-PAC PT "6 Clicks" Mobility  Outcome Measure Help needed turning from your back to your side while in a flat bed without using bedrails?: None Help needed moving from lying on your back to sitting on the side of a flat bed without using bedrails?: A Little Help needed moving to and from a bed to a chair (including a wheelchair)?: A Little Help needed standing up from a chair using your arms (e.g., wheelchair or bedside chair)?: A Little Help needed to walk in hospital room?: A Little Help needed climbing 3-5 steps with a railing? : A Lot 6 Click Score: 18    End of Session Equipment Utilized During Treatment: Gait belt Activity Tolerance: Patient tolerated treatment well Patient left: in bed;with bed alarm set;with call bell/phone within reach Nurse Communication: Mobility status PT Visit Diagnosis: History of falling (Z91.81);Difficulty in walking, not elsewhere classified (R26.2)    Time: 1340-1405 PT Time Calculation (min) (ACUTE ONLY): 25 min   Charges:   PT Evaluation $PT Eval Low Complexity: Wiggins, PT, DPT Acute Rehabilitation Services Office: [phone] Pager: [phone]  Trena Platt 07/09/2018, 3:07 PM

## 2018-07-09 NOTE — H&P (Addendum)
TRH H&P    Patient Demographics:    Allison Vaughan, is a 73 y.o. female  MRN: [id number]  DOB - 07-Apr-1945  Admit Date - 07/08/2018  Referring MD/NP/PA:  Ezequiel Essex  Outpatient Primary MD for the patient is Patient, No Pcp Per  Patient coming from:  home  Chief complaint-   Fall 2 days ago,  Right hand pain   HPI:    Allison Vaughan  is a 73 y.o. female,   w hx of Copd, hypertension, hyperlipidemia,  Chronic systolic CHF, Pafib, chronic pain, apparently presents with c/o Fall 2 days ago, with right wrist pain. Pt stated slight dizziness x2 days.    In ED,  T 98.2  P 62  R 18  Bp 146/63  Pox 95% Wt 44.7kg  Wbc 5.5, Hgb 9.9, Plt 204 Bun 8, Creatinine 0.79 Glucose 111 Lactic acid 0.5  Urinalysis wbc 6-10, rbc 0-5  pt was arousable but apparently appeared somewhat confused and per EMS had somewhat low o2 sat.  Pt notes that she has had dyspnea but this is chronic.    Xray right hand IMPRESSION: 1. No acute displaced fracture or dislocation. 2. Degenerative changes as above. 3. Soft tissue swelling about the hand.  CT brain IMPRESSION: 1. No acute intracranial hemorrhage. 2. No acute/traumatic cervical spine pathology. 3. No acute facial bone fractures.  CTA chest, CT abd/ pelvis IMPRESSION: 1. No PE identified. 2. Scattered tree-in-bud opacities bilaterally with areas of bronchial wall thickening and mucus plugging is suspicious for infectious bronchiolitis. There is a small amount of atelectasis at the right lung base. 3. No acute intra-abdominal abnormality. 4. Dilatation of the distal pancreatic duct with an abrupt cutoff at the level of the pancreatic body. This is new since the patient's CT in 2016. An underlying mass cannot be excluded in this location. Follow-up with a nonemergent outpatient contrast enhanced MRI is recommended for further evaluation of this finding. 5.  Splenomegaly. 6.  Aortic Atherosclerosis (ICD10-I70.0). 7. There is a small amount of free fluid in the patient's pelvis with no identifiable cause. The appendix is not reliably identified on this exam. There is a moderate amount of stool throughout the colon. There is some distention of the gastric antrum of unknown clinical significance.  Pt will be admitted for observation of dizziness, and recurrent falls and slight confusion ? Currently axox3   Review of systems:    In addition to the HPI above,  No Fever-chills, No Headache, No changes with Vision or hearing, No problems swallowing food or Liquids, No Chest pain, No Abdominal pain, No Nausea or Vomiting, bowel movements are regular, No Blood in stool or Urine, No dysuria, No new skin rashes or bruises, No new joints pains-aches,  No new weakness, tingling, numbness in any extremity, No recent weight gain or loss, No polyuria, polydypsia or polyphagia, No significant Mental Stressors.  All other systems reviewed and are negative.    Past History of the following :    Past Medical History:  Diagnosis Date   Accidental drug  overdose    Acute encephalopathy    Acute on chronic respiratory failure with hypoxia ([place]) 03/01/2017   Acute renal failure ([place]) 04/16/2014   AKI (acute kidney injury) ([place]) 04/30/2014   Altered mental status 06/27/2012   Anxiety    Atrial fibrillation ([place])    CAD (coronary artery disease)    CAP (community acquired pneumonia) 04/18/2014   Cardiomyopathy ([place])    Chronic hypoxemic respiratory failure ([place]) 06/26/2012   Chronic pain    COPD exacerbation ([place]) 06/26/2012   COPD with acute exacerbation ([place]) 03/01/2017   DEPRESSIVE DISORDER, NOS 03/13/2006   Qualifier: Diagnosis of  By: Damita Dunnings MD, Phillip Heal     Edema, peripheral    Essential hypertension 04/30/2014   Fall at home, initial encounter 03/01/2017   GASTROESOPHAGEAL REFLUX, NO ESOPHAGITIS 03/13/2006   Qualifier: Diagnosis  of  By: Damita Dunnings MD, Burt Ek 03/13/2006   Qualifier: Diagnosis of  By: Damita Dunnings MD, Graham     Hyperlipidemia    HYPERTENSION, BENIGN SYSTEMIC 03/13/2006   Qualifier: Diagnosis of  By: Damita Dunnings MD, Graham     Hypokalemia 04/30/2014   Hyponatremia 04/30/2014   Hypotension 04/30/2014   Hypoxemia 06/26/2012   Hypoxia 03/26/2018   Irritable bowel syndrome 03/13/2006   Qualifier: Diagnosis of  By: Damita Dunnings MD, Graham     Lung nodule 04/16/2014   MENOPAUSAL SYNDROME 03/13/2006   Qualifier: Diagnosis of  By: Damita Dunnings MD, Phillip Heal     Mood disorder ([place]) 03/13/2006   Qualifier: Diagnosis of  By: Damita Dunnings MD, Phillip Heal     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[place])    Nausea 04/30/2014   Nausea vomiting and diarrhea 04/16/2014   OSTEOARTHRITIS, MULTI SITES 03/13/2006   Qualifier: Diagnosis of  By: Damita Dunnings MD, Graham     Pulmonary nodule 04/30/2014   Respiratory failure ([place]) 01/22/2017   TOBACCO DEPENDENCE 03/13/2006   Qualifier: Diagnosis of  By: Damita Dunnings MD, Phillip Heal     Weight loss       Past Surgical History:  Procedure Laterality Date   EYE SURGERY     ROTATOR CUFF REPAIR Right 2012      Social History:      Social History   Tobacco Use   Smoking status: Current Every Day Smoker    Packs/day: 1.00    Types: Cigarettes   Smokeless tobacco: Never Used   Tobacco comment: states she is trying to quit  Substance Use Topics   Alcohol use: No    Frequency: Never    Comment: not in a long time       Family History :     Family History  Problem Relation Age of Onset   CAD Father    Lung cancer Brother    Healthy Son    Healthy Daughter    Healthy Daughter        Home Medications:   Prior to Admission medications   Medication Sig Start Date End Date Taking? Authorizing Provider  albuterol (PROVENTIL HFA;VENTOLIN HFA) 108 (90 BASE) MCG/ACT inhaler Inhale 2 puffs into the lungs every 6 (six) hours as needed for wheezing. 06/09/12  Yes Oti, Brantley Stage, MD    albuterol (PROVENTIL) (2.5 MG/3ML) 0.083% nebulizer solution Take 3 mLs (2.5 mg total) by nebulization every 2 (two) hours as needed for wheezing. 04/20/14  Yes Rama, Venetia Maxon, MD  apixaban (ELIQUIS) 5 MG TABS tablet Take 1 tablet (5 mg total) by mouth 2 (two) times daily. 03/28/18  Yes Cherene Altes, MD  atorvastatin (  LIPITOR) 10 MG tablet Take 1 tablet (10 mg total) by mouth at bedtime. 03/28/18  Yes Cherene Altes, MD  diltiazem (CARDIZEM CD) 300 MG 24 hr capsule Take 1 capsule (300 mg total) by mouth daily. 03/28/18 03/28/19 Yes Cherene Altes, MD  diltiazem (TIAZAC) 300 MG 24 hr capsule Take 300 mg by mouth daily.   Yes [provider]  FLUoxetine (PROZAC) 40 MG capsule Take 40 mg by mouth every morning.    Yes [provider]  ibuprofen (ADVIL) 200 MG tablet Take 400 mg by mouth every 6 (six) hours as needed for moderate pain.   Yes [provider]  LORazepam (ATIVAN) 0.5 MG tablet Take 0.5 mg by mouth every 8 (eight) hours as needed for anxiety.   Yes [provider]  ondansetron (ZOFRAN) 4 MG tablet Take 1 tablet (4 mg total) by mouth every 6 (six) hours. Patient taking differently: Take 4 mg by mouth every 8 (eight) hours as needed for nausea.  04/08/18  Yes Blanchie Dessert, MD  Oxycodone HCl 10 MG TABS Take 10 mg by mouth 2 (two) times daily as needed for pain. 03/23/18  Yes [provider]  feeding supplement, ENSURE ENLIVE, (ENSURE ENLIVE) LIQD Take 237 mLs by mouth 2 (two) times daily between meals. Patient taking differently: Take 237 mLs by mouth daily.  03/03/17   Aline August, MD  mometasone-formoterol (DULERA) 200-5 MCG/ACT AERO Inhale 2 puffs into the lungs 2 (two) times daily. Patient not taking: Reported on 07/09/2018 03/28/18   Cherene Altes, MD  potassium chloride SA (K-DUR) 20 MEQ tablet Take 20 mEq by mouth daily.  05/23/18   [provider]  predniSONE (DELTASONE) 10 MG tablet 2 tablets a day for 3 days, then  1 tablet a day for 3 days, then stop Patient not taking: Reported on 04/08/2018 03/28/18   Cherene Altes, MD  umeclidinium bromide (INCRUSE ELLIPTA) 62.5 MCG/INH AEPB Inhale 1 puff into the lungs daily. Patient not taking: Reported on 07/09/2018 03/29/18   Cherene Altes, MD     Allergies:    No Known Allergies   Physical Exam:   Vitals  Blood pressure (!) 146/63, pulse 62, temperature 98.2 F (36.8 C), temperature source Oral, resp. rate 18, height 5\' 1"  (1.549 m), weight 44.7 kg, SpO2 95 %.  1.  General: axox3  2. Psychiatric: euthymic  3. Neurologic: cn2-12 intact, reflexes 2+ symmetric, diffuse with no clonus  4. HEENMT:  Anicteric, pupils 1.41mm symmetric, direct,consensual intact Tongue midline Neck: no jvd   5. Respiratory : CTAB  6. Cardiovascular : rrr s1, s2,   7. Gastrointestinal:  Abd: soft, nt, nd, +bs  8. Skin:  Ext: no c/c/e,  + swelling right hand  9.Musculoskeletal:  Good ROM of all joints except right wrist    Data Review:    CBC Recent Labs  Lab 07/08/18 2345  WBC 7.8  HGB 10.5*  HCT 32.3*  PLT 216  MCV 90.5  MCH 29.4  MCHC 32.5  RDW 13.6   ------------------------------------------------------------------------------------------------------------------  Results for orders placed or performed during the hospital encounter of 07/08/18 (from the past 48 hour(s))  Blood gas, venous (at [facility] and [facility], not at [facility])     Status: Abnormal   Collection Time: 07/08/18 11:22 PM  Result Value Ref Range   pH, Ven 7.258 7.250 - 7.430   pCO2, Ven 68.6 (H) 44.0 - 60.0 mmHg   pO2, Ven 45.8 (H) 32.0 - 45.0 mmHg  Bicarbonate 29.6 (H) 20.0 - 28.0 mmol/L   Acid-Base Excess 1.1 0.0 - 2.0 mmol/L   O2 Saturation 76.5 %   Patient temperature 98.6    Collection site VEIN    Drawn by DRAWN BY RN    Sample type VENOUS     Comment: Performed at [facility], [place] [address] [place] [address] [place], [place] [address]  Urinalysis, Routine w  reflex microscopic     Status: Abnormal   Collection Time: 07/08/18 11:30 PM  Result Value Ref Range   Color, Urine STRAW (A) YELLOW   APPearance CLEAR CLEAR   Specific Gravity, Urine 1.003 (L) 1.005 - 1.030   pH 6.0 5.0 - 8.0   Glucose, UA NEGATIVE NEGATIVE mg/dL   Hgb urine dipstick NEGATIVE NEGATIVE   Bilirubin Urine NEGATIVE NEGATIVE   Ketones, ur NEGATIVE NEGATIVE mg/dL   Protein, ur NEGATIVE NEGATIVE mg/dL   Nitrite NEGATIVE NEGATIVE   Leukocytes,Ua SMALL (A) NEGATIVE   RBC / HPF 0-5 0 - 5 RBC/hpf   WBC, UA 6-10 0 - 5 WBC/hpf   Bacteria, UA NONE SEEN NONE SEEN   Squamous Epithelial / LPF 0-5 0 - 5    Comment: Performed at [facility], [place] [address], [place], [place] [address]  Basic metabolic panel     Status: Abnormal   Collection Time: 07/08/18 11:45 PM  Result Value Ref Range   Sodium 137 135 - 145 mmol/L   Potassium 3.8 3.5 - 5.1 mmol/L   Chloride 100 98 - 111 mmol/L   CO2 26 22 - 32 mmol/L   Glucose, Bld 122 (H) 70 - 99 mg/dL   BUN 9 8 - 23 mg/dL   Creatinine, Ser 0.95 0.44 - 1.00 mg/dL   Calcium 8.6 (L) 8.9 - 10.3 mg/dL   GFR calc non Af Amer 59 (L) >60 mL/min   GFR calc Af Amer >60 >60 mL/min   Anion gap 11 5 - 15    Comment: Performed at [facility], [place] [address] [place], [place] [address]  CBC     Status: Abnormal   Collection Time: 07/08/18 11:45 PM  Result Value Ref Range   WBC 7.8 4.0 - 10.5 K/uL   RBC 3.57 (L) 3.87 - 5.11 MIL/uL   Hemoglobin 10.5 (L) 12.0 - 15.0 g/dL   HCT 32.3 (L) 36.0 - 46.0 %   MCV 90.5 80.0 - 100.0 fL   MCH 29.4 26.0 - 34.0 pg   MCHC 32.5 30.0 - 36.0 g/dL   RDW 13.6 11.5 - 15.5 %   Platelets 216 150 - 400 K/uL   nRBC 0.0 0.0 - 0.2 %    Comment: Performed at [facility], [place] [address] [place], [place] [address]  Troponin I (High Sensitivity)     Status: Abnormal   Collection Time: 07/08/18 11:45 PM  Result Value Ref Range   Troponin I (High Sensitivity) 23.1 (H)  <18 ng/L    Comment: (NOTE) Elevated high sensitivity troponin I (hsTnI) values and significant  changes across serial measurements may suggest ACS but many other  chronic and acute conditions are known to elevate hsTnI results.  Refer to the "Links" section for chest pain algorithms and additional  guidance. Performed at [facility], [place] [address] [place], [place] [address]   D-dimer, quantitative (not at [facility])     Status: Abnormal   Collection Time: 07/08/18 11:45 PM  Result Value Ref Range   D-Dimer, Quant 1.33 (  H) 0.00 - 0.50 ug/mL-FEU    Comment: (NOTE) At the manufacturer cut-off of 0.50 ug/mL FEU, this assay has been documented to exclude PE with a sensitivity and negative predictive value of 97 to 99%.  At this time, this assay has not been approved by the FDA to exclude DVT/VTE. Results should be correlated with clinical presentation. Performed at [facility], [place] [address] [place], [place] [address]   Brain natriuretic peptide     Status: Abnormal   Collection Time: 07/08/18 11:45 PM  Result Value Ref Range   B Natriuretic Peptide 147.3 (H) 0.0 - 100.0 pg/mL    Comment: Performed at [facility], [place] [address] [place], [place] [address]  Lactic acid, plasma     Status: None   Collection Time: 07/08/18 11:45 PM  Result Value Ref Range   Lactic Acid, Venous 0.6 0.5 - 1.9 mmol/L    Comment: Performed at [facility], [place] [address], [place], [place] [address]  Blood culture (routine x 2)     Status: None (Preliminary result)   Collection Time: 07/08/18 11:45 PM   Specimen: BLOOD RIGHT FOREARM  Result Value Ref Range   Specimen Description      BLOOD RIGHT FOREARM Performed at [facility], [place] [address] [place], [place] [address]    Special Requests      BOTTLES DRAWN AEROBIC AND ANAEROBIC Blood Culture adequate volume Performed at [place]  [address] [place], [place] [address]    Culture PENDING    Report Status PENDING   Hepatic function panel     Status: Abnormal   Collection Time: 07/08/18 11:45 PM  Result Value Ref Range   Total Protein 6.4 (L) 6.5 - 8.1 g/dL   Albumin 3.4 (L) 3.5 - 5.0 g/dL   AST 13 (L) 15 - 41 U/L   ALT 6 0 - 44 U/L   Alkaline Phosphatase 99 38 - 126 U/L   Total Bilirubin 0.4 0.3 - 1.2 mg/dL   Bilirubin, Direct 0.1 0.0 - 0.2 mg/dL   Indirect Bilirubin 0.3 0.3 - 0.9 mg/dL    Comment: Performed at [facility], [place] [address] [place], [place] [address]  SARS Coronavirus 2 (CEPHEID - Performed in [place] hospital lab), Hosp Order     Status: None   Collection Time: 07/09/18  3:20 AM   Specimen: Nasopharyngeal Swab  Result Value Ref Range   SARS Coronavirus 2 NEGATIVE NEGATIVE    Comment: (NOTE) If result is NEGATIVE SARS-CoV-2 target nucleic acids are NOT DETECTED. The SARS-CoV-2 RNA is generally detectable in upper and lower  respiratory specimens during the acute phase of infection. The lowest  concentration of SARS-CoV-2 viral copies this assay can detect is 250  copies / mL. A negative result does not preclude SARS-CoV-2 infection  and should not be used as the sole basis for treatment or other  patient management decisions.  A negative result may occur with  improper specimen collection / handling, submission of specimen other  than nasopharyngeal swab, presence of viral mutation(s) within the  areas targeted by this assay, and inadequate number of viral copies  (<250 copies / mL). A negative result must be combined with clinical  observations, patient history, and epidemiological information. If result is POSITIVE SARS-CoV-2 target nucleic acids are DETECTED. The SARS-CoV-2 RNA is generally detectable in upper and lower  respiratory specimens dur ing the acute phase of infection.  Positive  results are indicative of active infection with SARS-CoV-2.  Clinical    correlation with patient history and other diagnostic information is  necessary to determine patient infection status.  Positive results do  not rule out bacterial infection or co-infection with other viruses. If result is PRESUMPTIVE POSTIVE SARS-CoV-2 nucleic acids MAY BE PRESENT.   A presumptive positive result was obtained on the submitted specimen  and confirmed on repeat testing.  While 2019 novel coronavirus  (SARS-CoV-2) nucleic acids may be present in the submitted sample  additional confirmatory testing may be necessary for epidemiological  and / or clinical management purposes  to differentiate between  SARS-CoV-2 and other Sarbecovirus currently known to infect humans.  If clinically indicated additional testing with an alternate test  methodology [phone]) is advised. The SARS-CoV-2 RNA is generally  detectable in upper and lower respiratory sp ecimens during the acute  phase of infection. The expected result is Negative. Fact Sheet for Patients:  StrictlyIdeas.no Fact Sheet for Healthcare Providers: BankingDealers.co.za This test is not yet approved or cleared by the [place] FDA and has been authorized for detection and/or diagnosis of SARS-CoV-2 by FDA under an Emergency Use Authorization (EUA).  This EUA will remain in effect (meaning this test can be used) for the duration of the COVID-19 declaration under Section 564(b)(1) of the Act, 21 U.S.C. section 360bbb-3(b)(1), unless the authorization is terminated or revoked sooner. Performed at [facility], [place] [address], [place], [place] [address]     Chemistries  Recent Labs  Lab 07/08/18 2345  NA 137  K 3.8  CL 100  CO2 26  GLUCOSE 122*  BUN 9  CREATININE 0.95  CALCIUM 8.6*  AST 13*  ALT 6  ALKPHOS 99  BILITOT 0.4    ------------------------------------------------------------------------------------------------------------------  ------------------------------------------------------------------------------------------------------------------ GFR: Estimated Creatinine Clearance: 36.1 mL/min (by C-G formula based on SCr of 0.95 mg/dL). Liver Function Tests: Recent Labs  Lab 07/08/18 2345  AST 13*  ALT 6  ALKPHOS 99  BILITOT 0.4  PROT 6.4*  ALBUMIN 3.4*   No results for input(s): LIPASE, AMYLASE in the last 168 hours. No results for input(s): AMMONIA in the last 168 hours. Coagulation Profile: No results for input(s): INR, PROTIME in the last 168 hours. Cardiac Enzymes: No results for input(s): CKTOTAL, CKMB, CKMBINDEX, TROPONINI in the last 168 hours. BNP (last 3 results) No results for input(s): PROBNP in the last 8760 hours. HbA1C: No results for input(s): HGBA1C in the last 72 hours. CBG: No results for input(s): GLUCAP in the last 168 hours. Lipid Profile: No results for input(s): CHOL, HDL, LDLCALC, TRIG, CHOLHDL, LDLDIRECT in the last 72 hours. Thyroid Function Tests: No results for input(s): TSH, T4TOTAL, FREET4, T3FREE, THYROIDAB in the last 72 hours. Anemia Panel: No results for input(s): VITAMINB12, FOLATE, FERRITIN, TIBC, IRON, RETICCTPCT in the last 72 hours.  --------------------------------------------------------------------------------------------------------------- Urine analysis:    Component Value Date/Time   COLORURINE STRAW (A) 07/08/2018 2330   APPEARANCEUR CLEAR 07/08/2018 2330   LABSPEC 1.003 (L) 07/08/2018 2330   PHURINE 6.0 07/08/2018 2330   GLUCOSEU NEGATIVE 07/08/2018 2330   HGBUR NEGATIVE 07/08/2018 2330   BILIRUBINUR NEGATIVE 07/08/2018 2330   KETONESUR NEGATIVE 07/08/2018 2330   PROTEINUR NEGATIVE 07/08/2018 2330   UROBILINOGEN 0.2 04/30/2014 2050   NITRITE NEGATIVE 07/08/2018 2330   LEUKOCYTESUR SMALL (A) 07/08/2018 2330      Imaging  Results:    Dg Wrist Complete Right  Result Date: 07/08/2018 CLINICAL DATA:  Pain following fall  EXAM: RIGHT WRIST - COMPLETE 3+ VIEW COMPARISON:  None. FINDINGS: Frontal, oblique, lateral, and ulnar deviation scaphoid images were obtained. There is no acute fracture or dislocation. There is marked osteoarthritic change in the first carpal-metacarpal and scaphotrapezial joints. There is heterotopic bone lateral to the trapezium bone. There is remodeling of the trapezium bone. There is milder osteoarthritic change in the radiocarpal joint. There is osteoarthritic change in all MCP joints. No bony destruction or erosion. IMPRESSION: Multifocal arthropathy, most marked in the first carpal-metacarpal and scaphotrapezial joints. There is heterotopic bone which may represent a degree of myositis ossificans lateral to the trapezium bone. No acute fracture or dislocation. Electronically Signed   By: Lowella Grip III M.D.   On: 07/08/2018 21:15   Ct Head Wo Contrast  Result Date: 07/09/2018 CLINICAL DATA:  73 year old female with head trauma. EXAM: CT HEAD WITHOUT CONTRAST CT MAXILLOFACIAL WITHOUT CONTRAST CT CERVICAL SPINE WITHOUT CONTRAST TECHNIQUE: Multidetector CT imaging of the head, cervical spine, and maxillofacial structures were performed using the standard protocol without intravenous contrast. Multiplanar CT image reconstructions of the cervical spine and maxillofacial structures were also generated. COMPARISON:  Head CT dated 06/08/2018 FINDINGS: CT HEAD FINDINGS Brain: The ventricles and sulci appropriate size for patient's age. Confluent periventricular and centrum semiovale white matter hypodensities most consistent with chronic microvascular ischemic changes or chronic white matter disease. There is no acute intracranial hemorrhage. No mass effect or midline shift. No extra-axial fluid collection. Vascular: No hyperdense vessel or unexpected calcification. Skull: No acute calvarial pathology.  Scattered small calvarial lucencies similar to prior CT. Other: None CT MAXILLOFACIAL FINDINGS Osseous: No acute fracture. No mandibular subluxation. Degenerative changes of the TMJs bilaterally. Irregularity of the right nasal bone without overlying soft tissue swelling. This is similar to prior CT and consistent with an old fracture. Orbits: The globes and retro-orbital fat are preserved. Left cataract surgery. Sinuses: Complete opacification of the right maxillary sinus. The remainder of the visualized paranasal sinuses and mastoid air cells are clear. Soft tissues: Negative. CT CERVICAL SPINE FINDINGS Alignment: No acute subluxation. Grade 1 C7-T1 anterolisthesis. Skull base and vertebrae: No acute fracture. Osteopenia. Small sclerotic focus at T2, indeterminate, possibly a bone island. Soft tissues and spinal canal: No prevertebral fluid or swelling. No visible canal hematoma. Disc levels:  Multilevel degenerative changes. Upper chest: Atherosclerotic calcification of the aortic arch. Bilateral carotid bulb calcified plaques. Other: None IMPRESSION: 1. No acute intracranial hemorrhage. 2. No acute/traumatic cervical spine pathology. 3. No acute facial bone fractures. Electronically Signed   By: Anner Crete M.D.   On: 07/09/2018 02:18   Ct Angio Chest Pe W And/or Wo Contrast  Result Date: 07/09/2018 CLINICAL DATA:  Positive D-dimer with PE suspected. History of AFib and COPD. EXAM: CT ANGIOGRAPHY CHEST CT ABDOMEN AND PELVIS WITH CONTRAST TECHNIQUE: Multidetector CT imaging of the chest was performed using the standard protocol during bolus administration of intravenous contrast. Multiplanar CT image reconstructions and MIPs were obtained to evaluate the vascular anatomy. Multidetector CT imaging of the abdomen and pelvis was performed using the standard protocol during bolus administration of intravenous contrast. CONTRAST:  110mL OMNIPAQUE IOHEXOL 350 MG/ML SOLN COMPARISON:  CT chest dated May 01, 2014. FINDINGS: CTA CHEST FINDINGS Cardiovascular: Satisfactory opacification of the pulmonary arteries to the segmental level. No evidence of pulmonary embolism. Normal heart size. No pericardial effusion. Aortic calcifications are noted. Coronary artery calcifications are noted. Mediastinum/Nodes: No enlarged mediastinal, hilar, or axillary lymph nodes. Thyroid gland, trachea, and esophagus demonstrate  no significant findings. Lungs/Pleura: There are few small scattered pulmonary nodules bilaterally. These appear stable from prior studies. There are a few tree-in-bud opacities in the right upper lobe. There is some bronchial wall thickening and mucus plugging at the lung bases bilaterally. There is no large pleural effusion. No pneumothorax. There is atelectasis at the right lung base. Musculoskeletal: No chest wall abnormality. No acute or significant osseous findings. Review of the MIP images confirms the above findings. CT ABDOMEN and PELVIS FINDINGS Hepatobiliary: No focal liver abnormality is seen. No gallstones, gallbladder wall thickening, or biliary dilatation. Pancreas: There is significant dilatation of the distal pancreatic duct which appears new since prior CT in 2016. There is an ill-defined soft tissue density in the pancreatic body. The distal pancreatic duct is unremarkable. The common bile duct is not significantly dilated. Spleen: The spleen is significantly enlarged measuring approximately 12.5 cm craniocaudad. Adrenals/Urinary Tract: There are multiple nonobstructing stones involving both kidneys. There is no hydronephrosis. The adrenal glands are unremarkable. The bladder is moderately distended. Stomach/Bowel: There is a moderate amount of stool in the colon. There is some dilatation of the gastric antrum and pylorus of unknown clinical significance. There is no evidence of a small-bowel obstruction. The appendix is not reliably identified on this exam. Vascular/Lymphatic: Aortic  atherosclerosis. No enlarged abdominal or pelvic lymph nodes. The proximal right SFA is occluded. The proximal left SFA is likely also occluded but is not well evaluated on this exam. Reproductive: Status post hysterectomy. No adnexal masses. Other: There is a small amount of free fluid in the patient's pelvis of unknown clinical significance. Musculoskeletal: There are advanced degenerative changes of lumbar spine. Review of the MIP images confirms the above findings. IMPRESSION: 1. No PE identified. 2. Scattered tree-in-bud opacities bilaterally with areas of bronchial wall thickening and mucus plugging is suspicious for infectious bronchiolitis. There is a small amount of atelectasis at the right lung base. 3. No acute intra-abdominal abnormality. 4. Dilatation of the distal pancreatic duct with an abrupt cutoff at the level of the pancreatic body. This is new since the patient's CT in 2016. An underlying mass cannot be excluded in this location. Follow-up with a nonemergent outpatient contrast enhanced MRI is recommended for further evaluation of this finding. 5. Splenomegaly. 6.  Aortic Atherosclerosis (ICD10-I70.0). 7. There is a small amount of free fluid in the patient's pelvis with no identifiable cause. The appendix is not reliably identified on this exam. There is a moderate amount of stool throughout the colon. There is some distention of the gastric antrum of unknown clinical significance. Electronically Signed   By: Constance Holster M.D.   On: 07/09/2018 02:34   Ct Cervical Spine Wo Contrast  Result Date: 07/09/2018 CLINICAL DATA:  73 year old female with head trauma. EXAM: CT HEAD WITHOUT CONTRAST CT MAXILLOFACIAL WITHOUT CONTRAST CT CERVICAL SPINE WITHOUT CONTRAST TECHNIQUE: Multidetector CT imaging of the head, cervical spine, and maxillofacial structures were performed using the standard protocol without intravenous contrast. Multiplanar CT image reconstructions of the cervical spine and  maxillofacial structures were also generated. COMPARISON:  Head CT dated 06/08/2018 FINDINGS: CT HEAD FINDINGS Brain: The ventricles and sulci appropriate size for patient's age. Confluent periventricular and centrum semiovale white matter hypodensities most consistent with chronic microvascular ischemic changes or chronic white matter disease. There is no acute intracranial hemorrhage. No mass effect or midline shift. No extra-axial fluid collection. Vascular: No hyperdense vessel or unexpected calcification. Skull: No acute calvarial pathology. Scattered small calvarial lucencies similar to prior  CT. Other: None CT MAXILLOFACIAL FINDINGS Osseous: No acute fracture. No mandibular subluxation. Degenerative changes of the TMJs bilaterally. Irregularity of the right nasal bone without overlying soft tissue swelling. This is similar to prior CT and consistent with an old fracture. Orbits: The globes and retro-orbital fat are preserved. Left cataract surgery. Sinuses: Complete opacification of the right maxillary sinus. The remainder of the visualized paranasal sinuses and mastoid air cells are clear. Soft tissues: Negative. CT CERVICAL SPINE FINDINGS Alignment: No acute subluxation. Grade 1 C7-T1 anterolisthesis. Skull base and vertebrae: No acute fracture. Osteopenia. Small sclerotic focus at T2, indeterminate, possibly a bone island. Soft tissues and spinal canal: No prevertebral fluid or swelling. No visible canal hematoma. Disc levels:  Multilevel degenerative changes. Upper chest: Atherosclerotic calcification of the aortic arch. Bilateral carotid bulb calcified plaques. Other: None IMPRESSION: 1. No acute intracranial hemorrhage. 2. No acute/traumatic cervical spine pathology. 3. No acute facial bone fractures. Electronically Signed   By: Anner Crete M.D.   On: 07/09/2018 02:18   Ct Abdomen Pelvis W Contrast  Result Date: 07/09/2018 CLINICAL DATA:  Positive D-dimer with PE suspected. History of AFib and  COPD. EXAM: CT ANGIOGRAPHY CHEST CT ABDOMEN AND PELVIS WITH CONTRAST TECHNIQUE: Multidetector CT imaging of the chest was performed using the standard protocol during bolus administration of intravenous contrast. Multiplanar CT image reconstructions and MIPs were obtained to evaluate the vascular anatomy. Multidetector CT imaging of the abdomen and pelvis was performed using the standard protocol during bolus administration of intravenous contrast. CONTRAST:  46mL OMNIPAQUE IOHEXOL 350 MG/ML SOLN COMPARISON:  CT chest dated May 01, 2014. FINDINGS: CTA CHEST FINDINGS Cardiovascular: Satisfactory opacification of the pulmonary arteries to the segmental level. No evidence of pulmonary embolism. Normal heart size. No pericardial effusion. Aortic calcifications are noted. Coronary artery calcifications are noted. Mediastinum/Nodes: No enlarged mediastinal, hilar, or axillary lymph nodes. Thyroid gland, trachea, and esophagus demonstrate no significant findings. Lungs/Pleura: There are few small scattered pulmonary nodules bilaterally. These appear stable from prior studies. There are a few tree-in-bud opacities in the right upper lobe. There is some bronchial wall thickening and mucus plugging at the lung bases bilaterally. There is no large pleural effusion. No pneumothorax. There is atelectasis at the right lung base. Musculoskeletal: No chest wall abnormality. No acute or significant osseous findings. Review of the MIP images confirms the above findings. CT ABDOMEN and PELVIS FINDINGS Hepatobiliary: No focal liver abnormality is seen. No gallstones, gallbladder wall thickening, or biliary dilatation. Pancreas: There is significant dilatation of the distal pancreatic duct which appears new since prior CT in 2016. There is an ill-defined soft tissue density in the pancreatic body. The distal pancreatic duct is unremarkable. The common bile duct is not significantly dilated. Spleen: The spleen is significantly enlarged  measuring approximately 12.5 cm craniocaudad. Adrenals/Urinary Tract: There are multiple nonobstructing stones involving both kidneys. There is no hydronephrosis. The adrenal glands are unremarkable. The bladder is moderately distended. Stomach/Bowel: There is a moderate amount of stool in the colon. There is some dilatation of the gastric antrum and pylorus of unknown clinical significance. There is no evidence of a small-bowel obstruction. The appendix is not reliably identified on this exam. Vascular/Lymphatic: Aortic atherosclerosis. No enlarged abdominal or pelvic lymph nodes. The proximal right SFA is occluded. The proximal left SFA is likely also occluded but is not well evaluated on this exam. Reproductive: Status post hysterectomy. No adnexal masses. Other: There is a small amount of free fluid in the patient's pelvis of  unknown clinical significance. Musculoskeletal: There are advanced degenerative changes of lumbar spine. Review of the MIP images confirms the above findings. IMPRESSION: 1. No PE identified. 2. Scattered tree-in-bud opacities bilaterally with areas of bronchial wall thickening and mucus plugging is suspicious for infectious bronchiolitis. There is a small amount of atelectasis at the right lung base. 3. No acute intra-abdominal abnormality. 4. Dilatation of the distal pancreatic duct with an abrupt cutoff at the level of the pancreatic body. This is new since the patient's CT in 2016. An underlying mass cannot be excluded in this location. Follow-up with a nonemergent outpatient contrast enhanced MRI is recommended for further evaluation of this finding. 5. Splenomegaly. 6.  Aortic Atherosclerosis (ICD10-I70.0). 7. There is a small amount of free fluid in the patient's pelvis with no identifiable cause. The appendix is not reliably identified on this exam. There is a moderate amount of stool throughout the colon. There is some distention of the gastric antrum of unknown clinical  significance. Electronically Signed   By: Constance Holster M.D.   On: 07/09/2018 02:34   Dg Hand Complete Right  Result Date: 07/09/2018 CLINICAL DATA:  Pain status post fall EXAM: RIGHT HAND - COMPLETE 3+ VIEW COMPARISON:  None. FINDINGS: There is no acute displaced fracture or dislocation. There are advanced degenerative changes of the first carpometacarpal joint as well as the scaphoid trapezial joint there are degenerative changes throughout the interphalangeal joints as well as the metacarpophalangeal joints, greatest at the third digit. There is soft tissue swelling about the hand. There is nonspecific flexion at the of the metacarpophalangeal joints. IMPRESSION: 1. No acute displaced fracture or dislocation. 2. Degenerative changes as above. 3. Soft tissue swelling about the hand. Electronically Signed   By: Constance Holster M.D.   On: 07/09/2018 00:15   Ct Maxillofacial Wo Contrast  Result Date: 07/09/2018 CLINICAL DATA:  73 year old female with head trauma. EXAM: CT HEAD WITHOUT CONTRAST CT MAXILLOFACIAL WITHOUT CONTRAST CT CERVICAL SPINE WITHOUT CONTRAST TECHNIQUE: Multidetector CT imaging of the head, cervical spine, and maxillofacial structures were performed using the standard protocol without intravenous contrast. Multiplanar CT image reconstructions of the cervical spine and maxillofacial structures were also generated. COMPARISON:  Head CT dated 06/08/2018 FINDINGS: CT HEAD FINDINGS Brain: The ventricles and sulci appropriate size for patient's age. Confluent periventricular and centrum semiovale white matter hypodensities most consistent with chronic microvascular ischemic changes or chronic white matter disease. There is no acute intracranial hemorrhage. No mass effect or midline shift. No extra-axial fluid collection. Vascular: No hyperdense vessel or unexpected calcification. Skull: No acute calvarial pathology. Scattered small calvarial lucencies similar to prior CT. Other: None CT  MAXILLOFACIAL FINDINGS Osseous: No acute fracture. No mandibular subluxation. Degenerative changes of the TMJs bilaterally. Irregularity of the right nasal bone without overlying soft tissue swelling. This is similar to prior CT and consistent with an old fracture. Orbits: The globes and retro-orbital fat are preserved. Left cataract surgery. Sinuses: Complete opacification of the right maxillary sinus. The remainder of the visualized paranasal sinuses and mastoid air cells are clear. Soft tissues: Negative. CT CERVICAL SPINE FINDINGS Alignment: No acute subluxation. Grade 1 C7-T1 anterolisthesis. Skull base and vertebrae: No acute fracture. Osteopenia. Small sclerotic focus at T2, indeterminate, possibly a bone island. Soft tissues and spinal canal: No prevertebral fluid or swelling. No visible canal hematoma. Disc levels:  Multilevel degenerative changes. Upper chest: Atherosclerotic calcification of the aortic arch. Bilateral carotid bulb calcified plaques. Other: None IMPRESSION: 1. No acute intracranial hemorrhage. 2.  No acute/traumatic cervical spine pathology. 3. No acute facial bone fractures. Electronically Signed   By: Anner Crete M.D.   On: 07/09/2018 02:18      Assessment & Plan:    Principal Problem:   Altered mental status Active Problems:   COPD (chronic obstructive pulmonary disease) (HCC)   Acute lower UTI   Anemia  AMS ? Confusion DIzziness Check MRI brain  Recurrent falls PT to evaluate and tx Try to avoid opioids  Acute lower uti Urine culture Rocephin 1gm iv qday  Pancreatic ductal dilation MRI abdomen  Free fluid in abdomen MRI abdomen  Pafib Cont Eliquis pharmacy to dose Cont Cardizem 300mg  po qday  Hyperlipidemia Cont Lipitor 10mg  po qhs  Copd Dulera 2puff bid Incruse 1puff qday Albuterol prn  Bronchitis/ Bronchiolitis zithromax  Anxiety Cont Prozac 40mg  po qday    DVT Prophylaxis-   Eliquis  AM Labs Ordered, also please review Full  Orders  Family Communication: Admission, patients condition and plan of care including tests being ordered have been discussed with the patient  who indicate understanding and agree with the plan and Code Status.  Code Status:  FULL CODE   Admission status: Observation/: Based on patients clinical presentation and evaluation of above clinical data, I have made determination that patient meets observation criteria at this time.  Time spent in minutes : 70   [name] M.D on 07/09/2018 at 4:46 AM

## 2018-07-09 NOTE — Progress Notes (Signed)
  Echocardiogram 2D Echocardiogram has been performed.  Randa Lynn Jas Betten 07/09/2018, 9:58 AM

## 2018-07-10 LAB — URINE CULTURE: Culture: <10000 — AB

## 2018-07-14 LAB — CULTURE, BLOOD (ROUTINE X 2)
Culture: NO GROWTH
Culture: NO GROWTH
Special Requests: ADEQUATE
Special Requests: ADEQUATE

## 2018-08-03 DIAGNOSIS — J449 Chronic obstructive pulmonary disease, unspecified: Secondary | ICD-10-CM | POA: Diagnosis not present

## 2018-08-04 DIAGNOSIS — I4891 Unspecified atrial fibrillation: Secondary | ICD-10-CM | POA: Diagnosis not present

## 2018-08-04 DIAGNOSIS — Z8739 Personal history of other diseases of the musculoskeletal system and connective tissue: Secondary | ICD-10-CM | POA: Diagnosis not present

## 2018-08-04 DIAGNOSIS — R609 Edema, unspecified: Secondary | ICD-10-CM | POA: Diagnosis not present

## 2018-08-04 DIAGNOSIS — K8689 Other specified diseases of pancreas: Secondary | ICD-10-CM | POA: Diagnosis not present

## 2018-08-07 ENCOUNTER — Other Ambulatory Visit: Payer: Self-pay | Admitting: Internal Medicine

## 2018-08-07 DIAGNOSIS — K8689 Other specified diseases of pancreas: Secondary | ICD-10-CM

## 2018-08-11 DIAGNOSIS — I480 Paroxysmal atrial fibrillation: Secondary | ICD-10-CM | POA: Diagnosis not present

## 2018-08-11 DIAGNOSIS — Z8739 Personal history of other diseases of the musculoskeletal system and connective tissue: Secondary | ICD-10-CM | POA: Diagnosis not present

## 2018-08-11 DIAGNOSIS — R609 Edema, unspecified: Secondary | ICD-10-CM | POA: Diagnosis not present

## 2018-08-11 DIAGNOSIS — K8689 Other specified diseases of pancreas: Secondary | ICD-10-CM | POA: Diagnosis not present

## 2018-08-20 ENCOUNTER — Emergency Department (HOSPITAL_COMMUNITY): Payer: Medicare HMO

## 2018-08-20 ENCOUNTER — Other Ambulatory Visit: Payer: Self-pay

## 2018-08-20 ENCOUNTER — Encounter (HOSPITAL_COMMUNITY): Payer: Self-pay

## 2018-08-20 ENCOUNTER — Emergency Department (HOSPITAL_COMMUNITY)
Admission: EM | Admit: 2018-08-20 | Discharge: 2018-08-20 | Disposition: A | Discharge status: Home / Self Care | Payer: Medicare HMO | Attending: Emergency Medicine | Admitting: Emergency Medicine

## 2018-08-20 DIAGNOSIS — Z7901 Long term (current) use of anticoagulants: Secondary | ICD-10-CM | POA: Insufficient documentation

## 2018-08-20 DIAGNOSIS — R296 Repeated falls: Secondary | ICD-10-CM | POA: Insufficient documentation

## 2018-08-20 DIAGNOSIS — I6782 Cerebral ischemia: Secondary | ICD-10-CM | POA: Diagnosis not present

## 2018-08-20 DIAGNOSIS — F17210 Nicotine dependence, cigarettes, uncomplicated: Secondary | ICD-10-CM | POA: Diagnosis not present

## 2018-08-20 DIAGNOSIS — S3991XA Unspecified injury of abdomen, initial encounter: Secondary | ICD-10-CM | POA: Diagnosis not present

## 2018-08-20 DIAGNOSIS — N200 Calculus of kidney: Secondary | ICD-10-CM | POA: Diagnosis not present

## 2018-08-20 DIAGNOSIS — E785 Hyperlipidemia, unspecified: Secondary | ICD-10-CM | POA: Insufficient documentation

## 2018-08-20 DIAGNOSIS — R51 Headache: Secondary | ICD-10-CM | POA: Insufficient documentation

## 2018-08-20 DIAGNOSIS — S0990XA Unspecified injury of head, initial encounter: Secondary | ICD-10-CM | POA: Insufficient documentation

## 2018-08-20 DIAGNOSIS — Z79899 Other long term (current) drug therapy: Secondary | ICD-10-CM | POA: Diagnosis not present

## 2018-08-20 DIAGNOSIS — S01112A Laceration without foreign body of left eyelid and periocular area, initial encounter: Secondary | ICD-10-CM | POA: Insufficient documentation

## 2018-08-20 DIAGNOSIS — R0789 Other chest pain: Secondary | ICD-10-CM | POA: Insufficient documentation

## 2018-08-20 DIAGNOSIS — Y999 Unspecified external cause status: Secondary | ICD-10-CM | POA: Insufficient documentation

## 2018-08-20 DIAGNOSIS — D649 Anemia, unspecified: Secondary | ICD-10-CM | POA: Insufficient documentation

## 2018-08-20 DIAGNOSIS — Z7951 Long term (current) use of inhaled steroids: Secondary | ICD-10-CM | POA: Insufficient documentation

## 2018-08-20 DIAGNOSIS — L03119 Cellulitis of unspecified part of limb: Secondary | ICD-10-CM | POA: Insufficient documentation

## 2018-08-20 DIAGNOSIS — R55 Syncope and collapse: Secondary | ICD-10-CM | POA: Insufficient documentation

## 2018-08-20 DIAGNOSIS — N189 Chronic kidney disease, unspecified: Secondary | ICD-10-CM | POA: Diagnosis not present

## 2018-08-20 DIAGNOSIS — W1830XA Fall on same level, unspecified, initial encounter: Secondary | ICD-10-CM | POA: Insufficient documentation

## 2018-08-20 DIAGNOSIS — R4182 Altered mental status, unspecified: Secondary | ICD-10-CM | POA: Insufficient documentation

## 2018-08-20 DIAGNOSIS — W19XXXA Unspecified fall, initial encounter: Secondary | ICD-10-CM

## 2018-08-20 DIAGNOSIS — R58 Hemorrhage, not elsewhere classified: Secondary | ICD-10-CM | POA: Diagnosis not present

## 2018-08-20 DIAGNOSIS — M5489 Other dorsalgia: Secondary | ICD-10-CM | POA: Diagnosis not present

## 2018-08-20 DIAGNOSIS — M545 Low back pain: Secondary | ICD-10-CM | POA: Insufficient documentation

## 2018-08-20 DIAGNOSIS — I2510 Atherosclerotic heart disease of native coronary artery without angina pectoris: Secondary | ICD-10-CM | POA: Insufficient documentation

## 2018-08-20 DIAGNOSIS — I4891 Unspecified atrial fibrillation: Secondary | ICD-10-CM | POA: Diagnosis not present

## 2018-08-20 DIAGNOSIS — M542 Cervicalgia: Secondary | ICD-10-CM | POA: Diagnosis not present

## 2018-08-20 DIAGNOSIS — S299XXA Unspecified injury of thorax, initial encounter: Secondary | ICD-10-CM | POA: Diagnosis not present

## 2018-08-20 DIAGNOSIS — J449 Chronic obstructive pulmonary disease, unspecified: Secondary | ICD-10-CM | POA: Insufficient documentation

## 2018-08-20 DIAGNOSIS — K219 Gastro-esophageal reflux disease without esophagitis: Secondary | ICD-10-CM | POA: Diagnosis not present

## 2018-08-20 DIAGNOSIS — Y929 Unspecified place or not applicable: Secondary | ICD-10-CM | POA: Diagnosis not present

## 2018-08-20 DIAGNOSIS — Y939 Activity, unspecified: Secondary | ICD-10-CM | POA: Diagnosis not present

## 2018-08-20 DIAGNOSIS — S199XXA Unspecified injury of neck, initial encounter: Secondary | ICD-10-CM | POA: Diagnosis not present

## 2018-08-20 DIAGNOSIS — R52 Pain, unspecified: Secondary | ICD-10-CM | POA: Diagnosis not present

## 2018-08-20 LAB — URINALYSIS, ROUTINE W REFLEX MICROSCOPIC
Bacteria, UA: NONE SEEN
Bilirubin Urine: NEGATIVE
Glucose, UA: NEGATIVE mg/dL
Hgb urine dipstick: NEGATIVE
Ketones, ur: NEGATIVE mg/dL
Nitrite: NEGATIVE
Protein, ur: NEGATIVE mg/dL
Specific Gravity, Urine: 1.004 — ABNORMAL LOW (ref 1.005–1.030)
pH: 7 (ref 5.0–8.0)

## 2018-08-20 LAB — CBC WITH DIFFERENTIAL/PLATELET
Abs Immature Granulocytes: 0.03 10*3/uL (ref 0.00–0.07)
Basophils Absolute: 0.1 10*3/uL (ref 0.0–0.1)
Basophils Relative: 1 %
Eosinophils Absolute: 0.5 10*3/uL (ref 0.0–0.5)
Eosinophils Relative: 9 %
HCT: 37 % (ref 36.0–46.0)
Hemoglobin: 12.4 g/dL (ref 12.0–15.0)
Immature Granulocytes: 1 %
Lymphocytes Relative: 22 %
Lymphs Abs: 1.2 10*3/uL (ref 0.7–4.0)
MCH: 28.6 pg (ref 26.0–34.0)
MCHC: 33.5 g/dL (ref 30.0–36.0)
MCV: 85.5 fL (ref 80.0–100.0)
Monocytes Absolute: 0.6 10*3/uL (ref 0.1–1.0)
Monocytes Relative: 11 %
Neutro Abs: 2.9 10*3/uL (ref 1.7–7.7)
Neutrophils Relative %: 56 %
Platelets: 212 10*3/uL (ref 150–400)
RBC: 4.33 MIL/uL (ref 3.87–5.11)
RDW: 12.9 % (ref 11.5–15.5)
WBC: 5.2 10*3/uL (ref 4.0–10.5)
nRBC: 0 % (ref 0.0–0.2)

## 2018-08-20 LAB — COMPREHENSIVE METABOLIC PANEL
ALT: 9 U/L (ref 0–44)
AST: 18 U/L (ref 15–41)
Albumin: 3.6 g/dL (ref 3.5–5.0)
Alkaline Phosphatase: 119 U/L (ref 38–126)
Anion gap: 12 (ref 5–15)
BUN: 15 mg/dL (ref 8–23)
CO2: 30 mmol/L (ref 22–32)
Calcium: 8.8 mg/dL — ABNORMAL LOW (ref 8.9–10.3)
Chloride: 92 mmol/L — ABNORMAL LOW (ref 98–111)
Creatinine, Ser: 1.09 mg/dL — ABNORMAL HIGH (ref 0.44–1.00)
GFR calc Af Amer: 58 mL/min — ABNORMAL LOW (ref >60)
GFR calc non Af Amer: 50 mL/min — ABNORMAL LOW (ref >60)
Glucose, Bld: 114 mg/dL — ABNORMAL HIGH (ref 70–99)
Potassium: 3.2 mmol/L — ABNORMAL LOW (ref 3.5–5.1)
Sodium: 134 mmol/L — ABNORMAL LOW (ref 135–145)
Total Bilirubin: 0.5 mg/dL (ref 0.3–1.2)
Total Protein: 6.2 g/dL — ABNORMAL LOW (ref 6.5–8.1)

## 2018-08-20 LAB — TROPONIN I (HIGH SENSITIVITY)
Troponin I (High Sensitivity): 6 ng/L (ref <18)
Troponin I (High Sensitivity): 5 ng/L (ref <18)

## 2018-08-20 LAB — BRAIN NATRIURETIC PEPTIDE: B Natriuretic Peptide: 39.9 pg/mL (ref 0.0–100.0)

## 2018-08-20 MED ORDER — CEPHALEXIN 250 MG PO CAPS: 250.0000 mg | ORAL_CAPSULE | Freq: Four times a day (QID) | ORAL | 0 refills | Status: AC

## 2018-08-20 MED ORDER — SODIUM CHLORIDE 0.9 % IV BOLUS
1000.0000 mL | Freq: Once | INTRAVENOUS | Status: AC
  Administered 2018-08-20: 1000 mL via INTRAVENOUS

## 2018-08-20 MED ORDER — IOHEXOL 300 MG/ML  SOLN
100.0000 mL | Freq: Once | INTRAMUSCULAR | Status: AC | PRN
  Administered 2018-08-20: 100 mL via INTRAVENOUS

## 2018-08-20 NOTE — ED Notes (Addendum)
Patient transported to CT 

## 2018-08-20 NOTE — ED Notes (Signed)
ED Provider at bedside. 

## 2018-08-20 NOTE — ED Notes (Signed)
Pt d/c by [facility], given cab voucher for [facility].

## 2018-08-20 NOTE — ED Triage Notes (Addendum)
Pt arrives with [facility] from home c/o falling out of the bed when trying to get out of bed this morning. Pt reports falling 3-4 times yesterday; pt states she felt dizzy before falling, uses a walker, and takes Eliquis. Pt reports bruising to her back, and neck pain. Pt has a laceration above left eye and head was wrapped by EMS. Pt has bilateral lower extremity edema and takes a diuretic; pt states she is able to use the restroom but that the "swelling will not go down."  Pt a&o x4.

## 2018-08-20 NOTE — ED Provider Notes (Addendum)
[facility] EMERGENCY DEPARTMENT Provider Note   CSN: [id number] Arrival date & time: 08/20/18  8101    History   Chief Complaint Chief Complaint  Patient presents with   Fall    HPI Allison Vaughan is a 73 y.o. female presenting for evaluation of dizziness and falls.  Patient states over the past several days, she has been feeling dizzy.  She describes this as feeling presyncopal.  As such, she has fallen multiple times.  She denies full syncope or passing out.  She hit her head yesterday and today.  She reports pain in her right low back and her head.  She is on blood thinners, has been taking them as prescribed.  She denies vision changes, slurred speech, numbness, tingling, chest pain, shortness of breath, nausea, vomiting, dental pain, urinary symptoms, abnormal bowel movements.  She has not taken anything for pain today.  Patient also reports bilateral lower leg swelling, which is worsened recently.  She is on fluid pills and has been taking it as prescribed, but reports no improvement.  Patient recently admitted to the hospital after falls.  Recommended that she get an MRI, the patient was unable to tolerate.  Earlier this year, patient was seen in the ED for nausea and vomiting.  At that time, she admitted to snorting heroin.     HPI  Past Medical History:  Diagnosis Date   Accidental drug overdose    Acute encephalopathy    Acute on chronic respiratory failure with hypoxia ([place]) 03/01/2017   Acute renal failure ([place]) 04/16/2014   AKI (acute kidney injury) ([place]) 04/30/2014   Altered mental status 06/27/2012   Anxiety    Atrial fibrillation ([place])    CAD (coronary artery disease)    CAP (community acquired pneumonia) 04/18/2014   Cardiomyopathy ([place])    Chronic hypoxemic respiratory failure ([place]) 06/26/2012   Chronic pain    COPD exacerbation ([place]) 06/26/2012   COPD with acute exacerbation ([place]) 03/01/2017   DEPRESSIVE DISORDER, NOS  03/13/2006   Qualifier: Diagnosis of  By: Damita Dunnings MD, Phillip Heal     Edema, peripheral    Essential hypertension 04/30/2014   Fall at home, initial encounter 03/01/2017   GASTROESOPHAGEAL REFLUX, NO ESOPHAGITIS 03/13/2006   Qualifier: Diagnosis of  By: Damita Dunnings MD, Burt Ek 03/13/2006   Qualifier: Diagnosis of  By: Damita Dunnings MD, Phillip Heal     Hyperlipidemia    HYPERTENSION, BENIGN SYSTEMIC 03/13/2006   Qualifier: Diagnosis of  By: Damita Dunnings MD, [place]     Hypokalemia 04/30/2014   Hyponatremia 04/30/2014   Hypotension 04/30/2014   Hypoxemia 06/26/2012   Hypoxia 03/26/2018   Irritable bowel syndrome 03/13/2006   Qualifier: Diagnosis of  By: Damita Dunnings MD, [place]     Lung nodule 04/16/2014   MENOPAUSAL SYNDROME 03/13/2006   Qualifier: Diagnosis of  By: Damita Dunnings MD, Phillip Heal     Mood disorder ([place]) 03/13/2006   Qualifier: Diagnosis of  By: Damita Dunnings MD, Phillip Heal     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[place])    Nausea 04/30/2014   Nausea vomiting and diarrhea 04/16/2014   OSTEOARTHRITIS, MULTI SITES 03/13/2006   Qualifier: Diagnosis of  By: Damita Dunnings MD, [place]     Pulmonary nodule 04/30/2014   Respiratory failure ([place]) 01/22/2017   TOBACCO DEPENDENCE 03/13/2006   Qualifier: Diagnosis of  By: Damita Dunnings MD, Phillip Heal     Weight loss     Patient Active Problem List   Diagnosis Date Noted   Altered mental status 07/09/2018  Acute lower UTI 07/09/2018   Anemia 07/09/2018   Bronchiolitis 07/09/2018   Bronchitis 24/09/7351   Acute metabolic encephalopathy 29/92/4268   Contusion of right wrist    Fall    Bacteria in urine    Mild renal insufficiency 03/26/2018   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[facility])    Atrial fibrillation with RVR ([place]) 03/01/2017   Acute on chronic respiratory failure with hypoxia ([place]) 03/01/2017   Fall at home, initial encounter 03/01/2017   Accidental drug overdose    Respiratory failure ([place]) 01/22/2017   AKI (acute kidney injury) ([place]) 04/30/2014    Pulmonary nodule 04/30/2014   Essential hypertension 04/30/2014   Hypokalemia 04/30/2014   Hyponatremia 04/30/2014   Hypotension 04/30/2014   Nausea 04/30/2014   Lung nodule 04/16/2014   Chronic hypoxemic respiratory failure ([place]) 06/26/2012   COPD (chronic obstructive pulmonary disease) ([place]) 06/26/2012   HYPERCHOLESTEROLEMIA 03/13/2006   TOBACCO DEPENDENCE 03/13/2006   Mood disorder ([place]) 03/13/2006   HYPERTENSION, BENIGN SYSTEMIC 03/13/2006   GASTROESOPHAGEAL REFLUX, NO ESOPHAGITIS 03/13/2006   IRRITABLE BOWEL SYNDROME 03/13/2006   MENOPAUSAL SYNDROME 03/13/2006   OSTEOARTHRITIS, MULTI SITES 03/13/2006    Past Surgical History:  Procedure Laterality Date   EYE SURGERY     ROTATOR CUFF REPAIR Right 2012     OB History   No obstetric history on file.      Home Medications    Prior to Admission medications   Medication Sig Start Date End Date Taking? Authorizing Provider  albuterol (PROVENTIL HFA;VENTOLIN HFA) 108 (90 BASE) MCG/ACT inhaler Inhale 2 puffs into the lungs every 6 (six) hours as needed for wheezing. 06/09/12  Yes Oti, Brantley Stage, MD  albuterol (PROVENTIL) (2.5 MG/3ML) 0.083% nebulizer solution Take 3 mLs (2.5 mg total) by nebulization every 2 (two) hours as needed for wheezing. 04/20/14  Yes Rama, Venetia Maxon, MD  apixaban (ELIQUIS) 5 MG TABS tablet Take 1 tablet (5 mg total) by mouth 2 (two) times daily. 03/28/18  Yes Cherene Altes, MD  atorvastatin (LIPITOR) 10 MG tablet Take 1 tablet (10 mg total) by mouth at bedtime. 03/28/18  Yes Cherene Altes, MD  clopidogrel (PLAVIX) 75 MG tablet Take 75 mg by mouth daily.  08/11/18  Yes [provider]  diltiazem (CARDIZEM CD) 300 MG 24 hr capsule Take 1 capsule (300 mg total) by mouth daily. 03/28/18 03/28/19 Yes Cherene Altes, MD  feeding supplement, ENSURE ENLIVE, (ENSURE ENLIVE) LIQD Take 237 mLs by mouth 2 (two) times daily between meals. Patient taking differently: Take 237 mLs by  mouth daily.  03/03/17  Yes Aline August, MD  fluticasone (FLONASE) 50 MCG/ACT nasal spray Place 2 sprays into both nostrils daily. 07/09/18  Yes Eugenie Filler, MD  furosemide (LASIX) 20 MG tablet Take 20 mg by mouth daily.  08/04/18  Yes [provider]  ibuprofen (ADVIL) 200 MG tablet Take 400 mg by mouth every 6 (six) hours as needed for moderate pain.   Yes [provider]  LORazepam (ATIVAN) 0.5 MG tablet Take 0.5 mg by mouth daily.    Yes [provider]  ondansetron (ZOFRAN) 4 MG tablet Take 1 tablet (4 mg total) by mouth every 6 (six) hours. Patient taking differently: Take 4 mg by mouth every 8 (eight) hours as needed for nausea.  04/08/18  Yes Blanchie Dessert, MD  Oxycodone HCl 10 MG TABS Take 10 mg by mouth 2 (two) times daily.  03/23/18  Yes [provider]  potassium chloride SA (K-DUR) 20 MEQ  tablet Take 20 mEq by mouth as needed (potassium and salt level get low).  05/23/18  Yes [provider]  senna (SENOKOT) 8.6 MG TABS tablet Take 1 tablet (8.6 mg total) by mouth at bedtime. Patient taking differently: Take 1 tablet by mouth every other day.  07/09/18  Yes Eugenie Filler, MD  cephALEXin (KEFLEX) 250 MG capsule Take 1 capsule (250 mg total) by mouth 4 (four) times daily for 7 days. 08/20/18 08/27/18  Gerldine Suleiman, PA-C  mometasone-formoterol (DULERA) 200-5 MCG/ACT AERO Inhale 2 puffs into the lungs 2 (two) times daily. Patient not taking: Reported on 07/09/2018 03/28/18   Cherene Altes, MD  umeclidinium bromide (INCRUSE ELLIPTA) 62.5 MCG/INH AEPB Inhale 1 puff into the lungs daily. Patient not taking: Reported on 07/09/2018 03/29/18   Cherene Altes, MD    Family History Family History  Problem Relation Age of Onset   CAD Father    Lung cancer Brother    Healthy Son    Healthy Daughter    Healthy Daughter     Social History Social History   Tobacco Use   Smoking status: Current Every Day Smoker     Packs/day: 1.00    Types: Cigarettes   Smokeless tobacco: Never Used   Tobacco comment: states she is trying to quit  Substance Use Topics   Alcohol use: No    Frequency: Never    Comment: not in a long time   Drug use: No     Allergies   Patient has no known allergies.   Review of Systems Review of Systems  Skin: Positive for wound.  Neurological:       Pre-syncope  Hematological: Bruises/bleeds easily.  All other systems reviewed and are negative.   Physical Exam Updated Vital Signs BP (!) 142/70    Pulse 64    Temp 98.3 F (36.8 C) (Oral)    Resp 17    Ht 5' (1.524 m)    Wt 44.5 kg    SpO2 93%    BMI 19.14 kg/m   Physical Exam Vitals signs and nursing note reviewed.  Constitutional:      General: She is not in acute distress.    Appearance: She is well-developed.     Comments: Appears nontoxic  HENT:     Head: Normocephalic.      Comments: New laceration to the left eyebrow.  Underlying hematoma. Healing laceration to the left parietal with contusion underneath.  No active bleeding.  No injury noted elsewhere on the head or scalp. Eyes:     Extraocular Movements: Extraocular movements intact.     Conjunctiva/sclera: Conjunctivae normal.     Pupils: Pupils are equal, round, and reactive to light.     Comments: EOMI and PERRLA.  No nystagmus.  Neck:     Musculoskeletal: Normal range of motion and neck supple.  Cardiovascular:     Rate and Rhythm: Normal rate and regular rhythm.     Pulses: Normal pulses.  Pulmonary:     Effort: Pulmonary effort is normal. No respiratory distress.     Breath sounds: Normal breath sounds. No wheezing.  Abdominal:     General: There is no distension.     Palpations: Abdomen is soft. There is no mass.     Tenderness: There is no abdominal tenderness. There is no guarding or rebound.  Musculoskeletal:        General: Tenderness present.     Comments: Strength and sensation intact x4.  Patient is ambulatory for  difficulty. Mild bilateral lower leg pain and swelling. More erythema and warmth of the right leg.  Pedal pulses intact. Tenderness palpation of right mid back over the kidney.  Contusion noted.  No tenderness palpation over midline spine.  Skin:    General: Skin is warm and dry.     Capillary Refill: Capillary refill takes less than 2 seconds.  Neurological:     General: No focal deficit present.     Mental Status: She is alert and oriented to person, place, and time.     GCS: GCS eye subscore is 4. GCS verbal subscore is 5. GCS motor subscore is 6.     Cranial Nerves: Cranial nerves are intact.     Sensory: Sensation is intact.     Motor: Motor function is intact.     Coordination: Coordination is intact.     Comments: CN intact.  Nose to finger intact.  Fine movement and coordination intact.      ED Treatments / Results  Labs (all labs ordered are listed, but only abnormal results are displayed) Labs Reviewed  COMPREHENSIVE METABOLIC PANEL - Abnormal; Notable for the following components:      Result Value   Sodium 134 (*)    Potassium 3.2 (*)    Chloride 92 (*)    Glucose, Bld 114 (*)    Creatinine, Ser 1.09 (*)    Calcium 8.8 (*)    Total Protein 6.2 (*)    GFR calc non Af Amer 50 (*)    GFR calc Af Amer 58 (*)    All other components within normal limits  URINALYSIS, ROUTINE W REFLEX MICROSCOPIC - Abnormal; Notable for the following components:   Color, Urine STRAW (*)    Specific Gravity, Urine 1.004 (*)    Leukocytes,Ua MODERATE (*)    All other components within normal limits  URINE CULTURE  CBC WITH DIFFERENTIAL/PLATELET  BRAIN NATRIURETIC PEPTIDE  TROPONIN I (HIGH SENSITIVITY)  TROPONIN I (HIGH SENSITIVITY)    EKG EKG Interpretation  Date/Time:  Thursday August 20 2018 08:46:34 EDT Ventricular Rate:  68 PR Interval:    QRS Duration: 109 QT Interval:  462 QTC Calculation: 492 R Axis:   160 Text Interpretation:  Sinus rhythm Atrial premature complex  Left posterior fascicular block Anterior infarct, old When compared to prior, no significant changes seen.  No STEMI Confirmed by Antony Blackbird [phone]) on 08/20/2018 9:48:26 AM   Radiology Dg Chest 2 View  Result Date: 08/20/2018 CLINICAL DATA:  Presyncope, fall EXAM: CHEST - 2 VIEW COMPARISON:  07/06/2018 FINDINGS: Mild cardiac enlargement without heart failure. Atherosclerotic aortic arch. Lungs well aerated and clear without infiltrate or effusion. IMPRESSION: No active cardiopulmonary disease. Electronically Signed   By: Franchot Gallo M.D.   On: 08/20/2018 09:43   Ct Head Wo Contrast  Result Date: 08/20/2018 CLINICAL DATA:  Head trauma, multiple falls, pain all over EXAM: CT HEAD WITHOUT CONTRAST CT CERVICAL SPINE WITHOUT CONTRAST TECHNIQUE: Multidetector CT imaging of the head and cervical spine was performed following the standard protocol without intravenous contrast. Multiplanar CT image reconstructions of the cervical spine were also generated. COMPARISON:  07/09/2018 FINDINGS: CT HEAD FINDINGS Brain: Generalized atrophy. Normal ventricular morphology. No midline shift or mass effect. Small vessel chronic ischemic changes of deep cerebral white matter. No intracranial hemorrhage, mass lesion, evidence of acute infarction, or extra-axial fluid collection. Vascular: Atherosclerotic calcification of internal carotid arteries at skull base Skull: Demineralized but intact Sinuses/Orbits: Persistent  opacification of the RIGHT maxillary sinus. Remaining visualized paranasal sinuses and mastoid air cells clear Other: N/A CT CERVICAL SPINE FINDINGS Alignment: Minimal retrolisthesis at C4-C5 likely degenerative. Minimal anterolisthesis C7-T1, likely degenerative. Both appear stable since prior exam. Remaining alignments normal. Skull base and vertebrae: Osseous demineralization. Multilevel disc space narrowing and endplate spur formation. Multilevel facet degenerative changes. Scattered motion artifacts.  Skull base intact. No fracture or bone destruction. Soft tissues and spinal canal: Prevertebral soft tissues normal thickness. Atherosclerotic calcifications of internal carotid and vertebral arteries. Additional atherosclerotic calcifications of the proximal great vessels and aortic arch. Disc levels:  No additional focal abnormalities Upper chest: Lung apices emphysematous but clear Other: N/A IMPRESSION: Atrophy with small vessel chronic ischemic changes of deep cerebral white matter. No acute intracranial abnormalities. Degenerative disc and facet disease changes of the cervical spine. No acute cervical spine abnormalities. Chronic opacification of the RIGHT maxillary sinus. Extensive atherosclerotic calcifications as above. Electronically Signed   By: Lavonia Dana M.D.   On: 08/20/2018 12:18   Ct Chest W Contrast  Result Date: 08/20/2018 CLINICAL DATA:  Abdominal trauma multiple falls EXAM: CT CHEST WITH CONTRAST TECHNIQUE: Multidetector CT imaging of the chest was performed during intravenous contrast administration. CONTRAST:  177mL OMNIPAQUE IOHEXOL 300 MG/ML  SOLN COMPARISON:  Multiple priors, most recent July 09, 2018 FINDINGS: Cardiovascular: There is ectasia of the ascending intrathoracic aorta measuring 3.3 cm in greatest diameter as on the prior exam. There is atherosclerotic calcification of the intrathoracic aorta. The origins of the 3 vessels off the arch are patent. Coronary artery calcifications are seen. The heart size is normal. There is no pericardial thickening or effusion. Mediastinum/Nodes: There are no enlarged mediastinal, hilar or axillary lymph nodes. The thyroid gland, trachea and esophagus demonstrate no significant findings. Lungs/Pleura: Again noted are the small subcentimeter nonspecific pulmonary nodules within both lungs, predominantly within the right upper lobe and posterior left lower lobe which are unchanged in appearance since the prior exam. There is a calcified granuloma  at the left lung base. Streaky atelectasis or scarring is seen at the right lung base. There is no pleural effusion. Upper abdomen: Dense aortic calcification is seen. The visualized portion of the upper abdomen is unremarkable. Musculoskeletal/Chest wall: There is no chest wall mass or suspicious osseous finding. No acute osseous abnormality Abdomen/pelvis: Lower chest: The visualized heart size within normal limits. No pericardial fluid/thickening. No hiatal hernia. Hepatobiliary: The liver is normal in density without focal abnormality.The main portal vein is patent. No evidence of calcified gallstones, gallbladder wall thickening or biliary dilatation. Pancreas: Unremarkable. No pancreatic ductal dilatation or surrounding inflammatory changes. Spleen: Normal in size without focal abnormality. Adrenals/Urinary Tract: Both adrenal glands appear normal. Renal vascular calcifications are seen. There is a 6 mm right lower pole renal calculus. The kidneys and collecting system appear normal without evidence of urinary tract calculus or hydronephrosis. Bladder is unremarkable. Stomach/Bowel: There is a large amount of retained food stuff seen within the mildly prominent stomach. There is fecalization of the distal ileum with a large amount of colonic stool is specially within the cecum and right colon. No areas of definite bowel wall thickening or obstruction is seen. Vascular/Lymphatic: There are no enlarged mesenteric, retroperitoneal, or pelvic lymph nodes. Scattered aortic, iliac atherosclerotic calcifications are seen without aneurysmal dilatation. The origin of the celiac, SMA, and IMA appear to be patent. Reproductive: The prostate is unremarkable. Other: No evidence of abdominal wall mass or hernia. Musculoskeletal: No acute or significant osseous findings.  There is diffuse osteopenia. Degenerative changes are seen throughout the thoracolumbar spine, most prominently at L1 through L3 which appears similar to  2019. A mild levoconvex scoliotic curvature seen. IMPRESSION: 1. No acute intrathoracic pathology. 2. Moderate to large amount of colonic stool with fecalization of the distal ileum which could be suggestive of slow transit. 3. Retained food and debris seen within the stomach. 4. Nonobstructing right renal calculus. Electronically Signed   By: Prudencio Pair M.D.   On: 08/20/2018 12:49   Ct Cervical Spine Wo Contrast  Result Date: 08/20/2018 CLINICAL DATA:  Head trauma, multiple falls, pain all over EXAM: CT HEAD WITHOUT CONTRAST CT CERVICAL SPINE WITHOUT CONTRAST TECHNIQUE: Multidetector CT imaging of the head and cervical spine was performed following the standard protocol without intravenous contrast. Multiplanar CT image reconstructions of the cervical spine were also generated. COMPARISON:  07/09/2018 FINDINGS: CT HEAD FINDINGS Brain: Generalized atrophy. Normal ventricular morphology. No midline shift or mass effect. Small vessel chronic ischemic changes of deep cerebral white matter. No intracranial hemorrhage, mass lesion, evidence of acute infarction, or extra-axial fluid collection. Vascular: Atherosclerotic calcification of internal carotid arteries at skull base Skull: Demineralized but intact Sinuses/Orbits: Persistent opacification of the RIGHT maxillary sinus. Remaining visualized paranasal sinuses and mastoid air cells clear Other: N/A CT CERVICAL SPINE FINDINGS Alignment: Minimal retrolisthesis at C4-C5 likely degenerative. Minimal anterolisthesis C7-T1, likely degenerative. Both appear stable since prior exam. Remaining alignments normal. Skull base and vertebrae: Osseous demineralization. Multilevel disc space narrowing and endplate spur formation. Multilevel facet degenerative changes. Scattered motion artifacts. Skull base intact. No fracture or bone destruction. Soft tissues and spinal canal: Prevertebral soft tissues normal thickness. Atherosclerotic calcifications of internal carotid and  vertebral arteries. Additional atherosclerotic calcifications of the proximal great vessels and aortic arch. Disc levels:  No additional focal abnormalities Upper chest: Lung apices emphysematous but clear Other: N/A IMPRESSION: Atrophy with small vessel chronic ischemic changes of deep cerebral white matter. No acute intracranial abnormalities. Degenerative disc and facet disease changes of the cervical spine. No acute cervical spine abnormalities. Chronic opacification of the RIGHT maxillary sinus. Extensive atherosclerotic calcifications as above. Electronically Signed   By: Lavonia Dana M.D.   On: 08/20/2018 12:18   Ct Abdomen Pelvis W Contrast  Result Date: 08/20/2018 CLINICAL DATA:  Abdominal trauma multiple falls EXAM: CT CHEST WITH CONTRAST TECHNIQUE: Multidetector CT imaging of the chest was performed during intravenous contrast administration. CONTRAST:  138mL OMNIPAQUE IOHEXOL 300 MG/ML  SOLN COMPARISON:  Multiple priors, most recent July 09, 2018 FINDINGS: Cardiovascular: There is ectasia of the ascending intrathoracic aorta measuring 3.3 cm in greatest diameter as on the prior exam. There is atherosclerotic calcification of the intrathoracic aorta. The origins of the 3 vessels off the arch are patent. Coronary artery calcifications are seen. The heart size is normal. There is no pericardial thickening or effusion. Mediastinum/Nodes: There are no enlarged mediastinal, hilar or axillary lymph nodes. The thyroid gland, trachea and esophagus demonstrate no significant findings. Lungs/Pleura: Again noted are the small subcentimeter nonspecific pulmonary nodules within both lungs, predominantly within the right upper lobe and posterior left lower lobe which are unchanged in appearance since the prior exam. There is a calcified granuloma at the left lung base. Streaky atelectasis or scarring is seen at the right lung base. There is no pleural effusion. Upper abdomen: Dense aortic calcification is seen. The  visualized portion of the upper abdomen is unremarkable. Musculoskeletal/Chest wall: There is no chest wall mass or suspicious osseous finding. No  acute osseous abnormality Abdomen/pelvis: Lower chest: The visualized heart size within normal limits. No pericardial fluid/thickening. No hiatal hernia. Hepatobiliary: The liver is normal in density without focal abnormality.The main portal vein is patent. No evidence of calcified gallstones, gallbladder wall thickening or biliary dilatation. Pancreas: Unremarkable. No pancreatic ductal dilatation or surrounding inflammatory changes. Spleen: Normal in size without focal abnormality. Adrenals/Urinary Tract: Both adrenal glands appear normal. Renal vascular calcifications are seen. There is a 6 mm right lower pole renal calculus. The kidneys and collecting system appear normal without evidence of urinary tract calculus or hydronephrosis. Bladder is unremarkable. Stomach/Bowel: There is a large amount of retained food stuff seen within the mildly prominent stomach. There is fecalization of the distal ileum with a large amount of colonic stool is specially within the cecum and right colon. No areas of definite bowel wall thickening or obstruction is seen. Vascular/Lymphatic: There are no enlarged mesenteric, retroperitoneal, or pelvic lymph nodes. Scattered aortic, iliac atherosclerotic calcifications are seen without aneurysmal dilatation. The origin of the celiac, SMA, and IMA appear to be patent. Reproductive: The prostate is unremarkable. Other: No evidence of abdominal wall mass or hernia. Musculoskeletal: No acute or significant osseous findings. There is diffuse osteopenia. Degenerative changes are seen throughout the thoracolumbar spine, most prominently at L1 through L3 which appears similar to 2019. A mild levoconvex scoliotic curvature seen. IMPRESSION: 1. No acute intrathoracic pathology. 2. Moderate to large amount of colonic stool with fecalization of the  distal ileum which could be suggestive of slow transit. 3. Retained food and debris seen within the stomach. 4. Nonobstructing right renal calculus. Electronically Signed   By: Prudencio Pair M.D.   On: 08/20/2018 12:49    Procedures .Marland KitchenLaceration Repair  Date/Time: 08/25/2018 7:21 AM Performed by: Franchot Heidelberg, PA-C Authorized by: Franchot Heidelberg, PA-C   Consent:    Consent obtained:  Verbal   Consent given by:  Patient   Risks discussed:  Infection, pain, poor cosmetic result, poor wound healing and need for additional repair Anesthesia (see MAR for exact dosages):    Anesthesia method:  None Laceration details:    Location:  Face   Face location:  L eyebrow   Length (cm):  1.5   Depth (mm):  1 Repair type:    Repair type:  Simple Exploration:    Wound exploration: wound explored through full range of motion and entire depth of wound probed and visualized     Wound extent: no foreign bodies/material noted, no muscle damage noted, no nerve damage noted, no underlying fracture noted and no vascular damage noted   Treatment:    Area cleansed with:  Soap and water   Amount of cleaning:  Standard Skin repair:    Repair method:  Tissue adhesive Approximation:    Approximation:  Close Post-procedure details:    Dressing:  Open (no dressing)   Patient tolerance of procedure:  Tolerated well, no immediate complications   (including critical care time)  Medications Ordered in ED Medications  sodium chloride 0.9 % bolus 1,000 mL (0 mLs Intravenous Stopped 08/20/18 1346)  iohexol (OMNIPAQUE) 300 MG/ML solution 100 mL (100 mLs Intravenous Contrast Given 08/20/18 1143)     Initial Impression / Assessment and Plan / ED Course  I have reviewed the triage vital signs and the nursing notes.  Pertinent labs & imaging results that were available during my care of the patient were reviewed by me and considered in my medical decision making (see chart for details).  Patient  presenting for evaluation of fall and presyncopal feeling.  Physical examination, she appears nontoxic.  No obvious neurologic deficits.  Patient has multiple injuries to her head due to recent falling.  As she is on blood thinners, will obtain CT to rule out intracranial bleed.  Patient also with bruising behind her right kidney.  Concern for possible bleed or sensory.  Will obtain CT chest abdomen pelvis.  Labs and urine for further evaluation of frequent falls.  Labs reassuring.  Show slight dehydration with an elevation in creatinine at 1.09 and a GFR down to 50.  Will given 1L fluid. Urine without infection.  Hemoglobin electrolytes stable.  Mitchell troponin VI.  Low suspicion for ACS.  As such, will obtain delta.  BNP negative, doubt peripheral leg swelling is due to CHF.  Favor cellulitis considering warmth and redness.  Repeat troponin reassuring at 5.  Chest x-ray viewed interpreted by me, no fracture or dislocation.  CT without intracranial bleed or injury.  No C-spine fracture or dislocation.  CT abdomen chest and pelvis reassuring.  Patient with constipation, but no other acute findings.  Discussed findings with patient.  Patient reports she is feeling well at this time.  Laceration repaired with Dermabond.  Discussed importance of hydration at home and treatment of constipation.  Encourage follow-up with primary care.  At this time, patient appears safe for discharge. Return precautions given.  Patient states she understands and agrees to plan.   Final Clinical Impressions(s) / ED Diagnoses   Final diagnoses:  Fall, initial encounter  Injury of head, initial encounter  Pre-syncope  Cellulitis of lower extremity, unspecified laterality    ED Discharge Orders         Ordered    cephALEXin (KEFLEX) 250 MG capsule  4 times daily     08/20/18 1402           Franchot Heidelberg, PA-C 08/20/18 1530    Tegeler, Gwenyth Allegra, MD 08/20/18 2115    Franchot Heidelberg, PA-C 08/25/18  2423    Tegeler, Gwenyth Allegra, MD 08/27/18 1911

## 2018-08-20 NOTE — Discharge Instructions (Addendum)
Take antibiotics as prescribed.  Take entire course, even if your symptoms improve. Continue taking your home medications as prescribed. Use your walker when walking to decrease risk of falls. Make sure you are staying well-hydrated water. It is important you follow-up with your primary care doctor early next week for recheck of symptoms.  You should discuss your frequent falls and constipation. The Dermabond will fall off on its own.  Do not pick at it or try and peel it away. Return to the emergency room with any new, worsening, concerning symptoms.

## 2018-08-20 NOTE — ED Notes (Signed)
Patient verbalizes understanding of discharge instructions. Opportunity for questioning and answers were provided. Armband removed by staff, pt discharged from ED.  

## 2018-08-20 NOTE — ED Notes (Signed)
Pt returned to room from CT

## 2018-08-21 LAB — URINE CULTURE: Culture: NO GROWTH

## 2018-08-23 ENCOUNTER — Telehealth: Payer: Self-pay

## 2018-08-23 NOTE — Telephone Encounter (Signed)
Spoke with pt's daughter [name] and Instructed pt during encounter with the [facility] Radiology Department, their may have been exposure to an unknown GI illness. Informed that it is suspected to be a food borne illness that can be transmitted person to person, but informed we are still investigating. Pt advised if they develop nausea, vomiting, or diarrhea in the next day or two to call [phone] from 7 am to 7 pm. Pt advised if they develop severe symptoms, please seek medical attention. Pt advised they are not at high risk of getting this illness and informed pt that testing is not recommended for asymptomatic patients.

## 2018-08-27 DIAGNOSIS — Z8739 Personal history of other diseases of the musculoskeletal system and connective tissue: Secondary | ICD-10-CM | POA: Diagnosis not present

## 2018-08-27 DIAGNOSIS — R609 Edema, unspecified: Secondary | ICD-10-CM | POA: Diagnosis not present

## 2018-08-27 DIAGNOSIS — E785 Hyperlipidemia, unspecified: Secondary | ICD-10-CM | POA: Diagnosis not present

## 2018-08-27 DIAGNOSIS — I10 Essential (primary) hypertension: Secondary | ICD-10-CM | POA: Diagnosis not present

## 2018-08-27 DIAGNOSIS — I4891 Unspecified atrial fibrillation: Secondary | ICD-10-CM | POA: Diagnosis not present

## 2018-09-01 ENCOUNTER — Other Ambulatory Visit: Payer: Medicare HMO

## 2018-09-03 DIAGNOSIS — J449 Chronic obstructive pulmonary disease, unspecified: Secondary | ICD-10-CM | POA: Diagnosis not present

## 2018-09-16 DIAGNOSIS — I4891 Unspecified atrial fibrillation: Secondary | ICD-10-CM | POA: Diagnosis not present

## 2018-09-16 DIAGNOSIS — R609 Edema, unspecified: Secondary | ICD-10-CM | POA: Diagnosis not present

## 2018-09-28 ENCOUNTER — Other Ambulatory Visit: Payer: Medicare HMO

## 2018-10-04 DIAGNOSIS — J449 Chronic obstructive pulmonary disease, unspecified: Secondary | ICD-10-CM | POA: Diagnosis not present

## 2018-10-19 DIAGNOSIS — R55 Syncope and collapse: Secondary | ICD-10-CM | POA: Diagnosis not present

## 2018-10-19 DIAGNOSIS — F419 Anxiety disorder, unspecified: Secondary | ICD-10-CM | POA: Diagnosis not present

## 2018-10-19 DIAGNOSIS — M549 Dorsalgia, unspecified: Secondary | ICD-10-CM | POA: Diagnosis not present

## 2018-10-20 ENCOUNTER — Other Ambulatory Visit: Payer: Self-pay | Admitting: Internal Medicine

## 2018-10-20 DIAGNOSIS — R55 Syncope and collapse: Secondary | ICD-10-CM

## 2018-10-23 ENCOUNTER — Other Ambulatory Visit: Payer: Self-pay | Admitting: Internal Medicine

## 2018-10-26 ENCOUNTER — Other Ambulatory Visit: Payer: Medicare HMO

## 2018-11-03 DIAGNOSIS — J449 Chronic obstructive pulmonary disease, unspecified: Secondary | ICD-10-CM | POA: Diagnosis not present

## 2018-12-04 DIAGNOSIS — J449 Chronic obstructive pulmonary disease, unspecified: Secondary | ICD-10-CM | POA: Diagnosis not present

## 2018-12-21 ENCOUNTER — Emergency Department (HOSPITAL_COMMUNITY): Payer: Medicare HMO

## 2018-12-21 ENCOUNTER — Inpatient Hospital Stay (HOSPITAL_COMMUNITY)
Admission: EM | Admit: 2018-12-21 | Discharge: 2018-12-26 | DRG: 071 | Disposition: A | Discharge status: Home / Self Care | Payer: Medicare HMO | Attending: Internal Medicine | Admitting: Internal Medicine

## 2018-12-21 ENCOUNTER — Other Ambulatory Visit: Payer: Self-pay

## 2018-12-21 ENCOUNTER — Encounter (HOSPITAL_COMMUNITY): Payer: Self-pay | Admitting: Emergency Medicine

## 2018-12-21 DIAGNOSIS — E860 Dehydration: Secondary | ICD-10-CM | POA: Diagnosis not present

## 2018-12-21 DIAGNOSIS — R9431 Abnormal electrocardiogram [ECG] [EKG]: Secondary | ICD-10-CM | POA: Diagnosis not present

## 2018-12-21 DIAGNOSIS — J9611 Chronic respiratory failure with hypoxia: Secondary | ICD-10-CM | POA: Diagnosis present

## 2018-12-21 DIAGNOSIS — N179 Acute kidney failure, unspecified: Secondary | ICD-10-CM | POA: Diagnosis not present

## 2018-12-21 DIAGNOSIS — I10 Essential (primary) hypertension: Secondary | ICD-10-CM | POA: Diagnosis present

## 2018-12-21 DIAGNOSIS — F419 Anxiety disorder, unspecified: Secondary | ICD-10-CM | POA: Diagnosis present

## 2018-12-21 DIAGNOSIS — R627 Adult failure to thrive: Secondary | ICD-10-CM | POA: Diagnosis present

## 2018-12-21 DIAGNOSIS — J449 Chronic obstructive pulmonary disease, unspecified: Secondary | ICD-10-CM | POA: Diagnosis present

## 2018-12-21 DIAGNOSIS — R531 Weakness: Secondary | ICD-10-CM | POA: Diagnosis not present

## 2018-12-21 DIAGNOSIS — K2970 Gastritis, unspecified, without bleeding: Secondary | ICD-10-CM | POA: Diagnosis present

## 2018-12-21 DIAGNOSIS — E876 Hypokalemia: Secondary | ICD-10-CM | POA: Diagnosis present

## 2018-12-21 DIAGNOSIS — Z8249 Family history of ischemic heart disease and other diseases of the circulatory system: Secondary | ICD-10-CM

## 2018-12-21 DIAGNOSIS — E871 Hypo-osmolality and hyponatremia: Secondary | ICD-10-CM | POA: Diagnosis present

## 2018-12-21 DIAGNOSIS — Z801 Family history of malignant neoplasm of trachea, bronchus and lung: Secondary | ICD-10-CM

## 2018-12-21 DIAGNOSIS — I129 Hypertensive chronic kidney disease with stage 1 through stage 4 chronic kidney disease, or unspecified chronic kidney disease: Secondary | ICD-10-CM | POA: Diagnosis present

## 2018-12-21 DIAGNOSIS — Z79891 Long term (current) use of opiate analgesic: Secondary | ICD-10-CM

## 2018-12-21 DIAGNOSIS — R1111 Vomiting without nausea: Secondary | ICD-10-CM | POA: Diagnosis not present

## 2018-12-21 DIAGNOSIS — K8689 Other specified diseases of pancreas: Secondary | ICD-10-CM | POA: Diagnosis present

## 2018-12-21 DIAGNOSIS — F17210 Nicotine dependence, cigarettes, uncomplicated: Secondary | ICD-10-CM | POA: Diagnosis present

## 2018-12-21 DIAGNOSIS — I480 Paroxysmal atrial fibrillation: Secondary | ICD-10-CM | POA: Diagnosis present

## 2018-12-21 DIAGNOSIS — Z7151 Drug abuse counseling and surveillance of drug abuser: Secondary | ICD-10-CM

## 2018-12-21 DIAGNOSIS — K5900 Constipation, unspecified: Secondary | ICD-10-CM | POA: Diagnosis present

## 2018-12-21 DIAGNOSIS — R112 Nausea with vomiting, unspecified: Secondary | ICD-10-CM | POA: Diagnosis present

## 2018-12-21 DIAGNOSIS — I428 Other cardiomyopathies: Secondary | ICD-10-CM | POA: Diagnosis not present

## 2018-12-21 DIAGNOSIS — G9340 Encephalopathy, unspecified: Secondary | ICD-10-CM | POA: Diagnosis present

## 2018-12-21 DIAGNOSIS — K219 Gastro-esophageal reflux disease without esophagitis: Secondary | ICD-10-CM | POA: Diagnosis present

## 2018-12-21 DIAGNOSIS — K5641 Fecal impaction: Secondary | ICD-10-CM | POA: Diagnosis present

## 2018-12-21 DIAGNOSIS — G8929 Other chronic pain: Secondary | ICD-10-CM | POA: Diagnosis present

## 2018-12-21 DIAGNOSIS — I2510 Atherosclerotic heart disease of native coronary artery without angina pectoris: Secondary | ICD-10-CM | POA: Diagnosis present

## 2018-12-21 DIAGNOSIS — K589 Irritable bowel syndrome without diarrhea: Secondary | ICD-10-CM | POA: Diagnosis present

## 2018-12-21 DIAGNOSIS — I252 Old myocardial infarction: Secondary | ICD-10-CM

## 2018-12-21 DIAGNOSIS — Z7902 Long term (current) use of antithrombotics/antiplatelets: Secondary | ICD-10-CM

## 2018-12-21 DIAGNOSIS — Z20828 Contact with and (suspected) exposure to other viral communicable diseases: Secondary | ICD-10-CM | POA: Diagnosis present

## 2018-12-21 DIAGNOSIS — E785 Hyperlipidemia, unspecified: Secondary | ICD-10-CM | POA: Diagnosis present

## 2018-12-21 DIAGNOSIS — R0602 Shortness of breath: Secondary | ICD-10-CM | POA: Diagnosis not present

## 2018-12-21 DIAGNOSIS — N182 Chronic kidney disease, stage 2 (mild): Secondary | ICD-10-CM | POA: Diagnosis present

## 2018-12-21 DIAGNOSIS — G9341 Metabolic encephalopathy: Principal | ICD-10-CM | POA: Diagnosis present

## 2018-12-21 DIAGNOSIS — I4581 Long QT syndrome: Secondary | ICD-10-CM | POA: Diagnosis not present

## 2018-12-21 DIAGNOSIS — R110 Nausea: Secondary | ICD-10-CM | POA: Diagnosis not present

## 2018-12-21 DIAGNOSIS — F1190 Opioid use, unspecified, uncomplicated: Secondary | ICD-10-CM | POA: Diagnosis present

## 2018-12-21 DIAGNOSIS — R911 Solitary pulmonary nodule: Secondary | ICD-10-CM | POA: Diagnosis present

## 2018-12-21 DIAGNOSIS — Z79899 Other long term (current) drug therapy: Secondary | ICD-10-CM

## 2018-12-21 DIAGNOSIS — R1110 Vomiting, unspecified: Secondary | ICD-10-CM | POA: Diagnosis not present

## 2018-12-21 DIAGNOSIS — Z7901 Long term (current) use of anticoagulants: Secondary | ICD-10-CM

## 2018-12-21 LAB — I-STAT CHEM 8, ED
BUN: 8 mg/dL (ref 8–23)
Calcium, Ion: 0.88 mmol/L — CL (ref 1.15–1.40)
Chloride: 88 mmol/L — ABNORMAL LOW (ref 98–111)
Creatinine, Ser: 0.8 mg/dL (ref 0.44–1.00)
Glucose, Bld: 118 mg/dL — ABNORMAL HIGH (ref 70–99)
HCT: 39 % (ref 36.0–46.0)
Hemoglobin: 13.3 g/dL (ref 12.0–15.0)
Potassium: 2.8 mmol/L — ABNORMAL LOW (ref 3.5–5.1)
Sodium: 130 mmol/L — ABNORMAL LOW (ref 135–145)
TCO2: 33 mmol/L — ABNORMAL HIGH (ref 22–32)

## 2018-12-21 LAB — CBC WITH DIFFERENTIAL/PLATELET
Abs Immature Granulocytes: 0.03 10*3/uL (ref 0.00–0.07)
Basophils Absolute: 0 10*3/uL (ref 0.0–0.1)
Basophils Relative: 1 %
Eosinophils Absolute: 0 10*3/uL (ref 0.0–0.5)
Eosinophils Relative: 0 %
HCT: 38.6 % (ref 36.0–46.0)
Hemoglobin: 13.7 g/dL (ref 12.0–15.0)
Immature Granulocytes: 1 %
Lymphocytes Relative: 20 %
Lymphs Abs: 0.8 10*3/uL (ref 0.7–4.0)
MCH: 29.1 pg (ref 26.0–34.0)
MCHC: 35.5 g/dL (ref 30.0–36.0)
MCV: 82.1 fL (ref 80.0–100.0)
Monocytes Absolute: 0.6 10*3/uL (ref 0.1–1.0)
Monocytes Relative: 15 %
Neutro Abs: 2.7 10*3/uL (ref 1.7–7.7)
Neutrophils Relative %: 63 %
Platelets: 271 10*3/uL (ref 150–400)
RBC: 4.7 MIL/uL (ref 3.87–5.11)
RDW: 12.1 % (ref 11.5–15.5)
WBC: 4.2 10*3/uL (ref 4.0–10.5)
nRBC: 0 % (ref 0.0–0.2)

## 2018-12-21 LAB — URINALYSIS, ROUTINE W REFLEX MICROSCOPIC
Bilirubin Urine: NEGATIVE
Glucose, UA: NEGATIVE mg/dL
Hgb urine dipstick: NEGATIVE
Ketones, ur: NEGATIVE mg/dL
Leukocytes,Ua: NEGATIVE
Nitrite: NEGATIVE
Protein, ur: NEGATIVE mg/dL
Specific Gravity, Urine: 1.006 (ref 1.005–1.030)
pH: 8 (ref 5.0–8.0)

## 2018-12-21 LAB — COMPREHENSIVE METABOLIC PANEL
ALT: 9 U/L (ref 0–44)
AST: 16 U/L (ref 15–41)
Albumin: 4 g/dL (ref 3.5–5.0)
Alkaline Phosphatase: 108 U/L (ref 38–126)
Anion gap: 12 (ref 5–15)
BUN: 9 mg/dL (ref 8–23)
CO2: 31 mmol/L (ref 22–32)
Calcium: 9.2 mg/dL (ref 8.9–10.3)
Chloride: 88 mmol/L — ABNORMAL LOW (ref 98–111)
Creatinine, Ser: 0.93 mg/dL (ref 0.44–1.00)
GFR calc Af Amer: >60 mL/min (ref >60)
GFR calc non Af Amer: >60 mL/min (ref >60)
Glucose, Bld: 122 mg/dL — ABNORMAL HIGH (ref 70–99)
Potassium: 2.8 mmol/L — ABNORMAL LOW (ref 3.5–5.1)
Sodium: 131 mmol/L — ABNORMAL LOW (ref 135–145)
Total Bilirubin: 0.6 mg/dL (ref 0.3–1.2)
Total Protein: 6.5 g/dL (ref 6.5–8.1)

## 2018-12-21 LAB — LIPASE, BLOOD: Lipase: 17 U/L (ref 11–51)

## 2018-12-21 LAB — SARS CORONAVIRUS 2 (TAT 6-24 HRS): SARS Coronavirus 2: NEGATIVE

## 2018-12-21 LAB — MAGNESIUM: Magnesium: 1.7 mg/dL (ref 1.7–2.4)

## 2018-12-21 MED ORDER — POTASSIUM CHLORIDE 10 MEQ/100ML IV SOLN
10.0000 meq | INTRAVENOUS | Status: AC
  Administered 2018-12-21 (×3): 10 meq via INTRAVENOUS
  Filled 2018-12-21 (×3): qty 100

## 2018-12-21 MED ORDER — MAGNESIUM SULFATE 2 GM/50ML IV SOLN
2.0000 g | Freq: Once | INTRAVENOUS | Status: AC
  Administered 2018-12-21: 2 g via INTRAVENOUS
  Filled 2018-12-21: qty 50

## 2018-12-21 MED ORDER — SODIUM CHLORIDE 0.9 % IV BOLUS
1000.0000 mL | Freq: Once | INTRAVENOUS | Status: AC
  Administered 2018-12-21: 1000 mL via INTRAVENOUS

## 2018-12-21 MED ORDER — FENTANYL CITRATE (PF) 100 MCG/2ML IJ SOLN
50.0000 ug | Freq: Once | INTRAMUSCULAR | Status: AC
  Administered 2018-12-21: 50 ug via INTRAVENOUS
  Filled 2018-12-21: qty 2

## 2018-12-21 MED ORDER — IOHEXOL 300 MG/ML  SOLN
100.0000 mL | Freq: Once | INTRAMUSCULAR | Status: AC | PRN
  Administered 2018-12-21: 100 mL via INTRAVENOUS

## 2018-12-21 MED ORDER — LORAZEPAM 2 MG/ML IJ SOLN
0.5000 mg | Freq: Once | INTRAMUSCULAR | Status: AC
  Administered 2018-12-21: 0.5 mg via INTRAVENOUS
  Filled 2018-12-21: qty 1

## 2018-12-21 NOTE — ED Notes (Signed)
ED Provider at bedside. 

## 2018-12-21 NOTE — ED Provider Notes (Signed)
MSE was initiated and I personally evaluated the patient and placed orders (if any) at  5:00 PM on December 21, 2018.  The patient appears stable so that the remainder of the MSE may be completed by another provider.  Patient presents today for evaluation of nausea, vomiting, and general body aches, chills and generally feeling unwell for the past 2 days.  She was given Zofran by EMS in route.  She reports generally feeling weak and unwell.  She reports that she did have a fever at home.  She denies any known sick contacts.  EKG here showing significantly prolonged QTC.    Labs ordered.    Lorin Glass, Hershal Coria 12/21/18 1806    Carmin Muskrat, MD 12/25/18 [phone]

## 2018-12-21 NOTE — ED Provider Notes (Signed)
[facility] EMERGENCY DEPARTMENT Provider Note   CSN: [id number] Arrival date & time: 12/21/18  1627     History   Chief Complaint Chief Complaint  Patient presents with  . Chills  . Nausea    HPI Allison Vaughan is a 73 y.o. female.     The history is provided by the patient and medical records.   Allison Vaughan is a 73 y.o. female who presents to the Emergency Department complaining of nausea. She presents the emergency department from home via EMS for evaluation of nausea, generalized weakness. Symptoms began about one week ago where she experienced nausea with profound vomiting. The vomiting has been resolved for the last few days but she has persistent nausea with poor oral intake and generalized weakness. She feels as if she might pass out if she tries to ambulate. She denies any fevers, chills, chest pain, shortness of breath, abdominal pain. Denies any similar symptoms. She lives at home with her daughter. No known COVID 19 exposures. She has been unable to take her home medications due to feeling poorly. Past Medical History:  Diagnosis Date  . Accidental drug overdose   . Acute encephalopathy   . Acute on chronic respiratory failure with hypoxia ([place]) 03/01/2017  . Acute renal failure ([place]) 04/16/2014  . AKI (acute kidney injury) ([place]) 04/30/2014  . Altered mental status 06/27/2012  . Anxiety   . Atrial fibrillation ([place])   . CAD (coronary artery disease)   . CAP (community acquired pneumonia) 04/18/2014  . Cardiomyopathy ([place])   . Chronic hypoxemic respiratory failure ([place]) 06/26/2012  . Chronic pain   . COPD exacerbation ([place]) 06/26/2012  . COPD with acute exacerbation ([place]) 03/01/2017  . DEPRESSIVE DISORDER, NOS 03/13/2006   Qualifier: Diagnosis of  By: Damita Dunnings MD, Phillip Heal    . Edema, peripheral   . Essential hypertension 04/30/2014  . Fall at home, initial encounter 03/01/2017  . GASTROESOPHAGEAL REFLUX, NO ESOPHAGITIS 03/13/2006   Qualifier:  Diagnosis of  By: Damita Dunnings MD, Phillip Heal    . HYPERCHOLESTEROLEMIA 03/13/2006   Qualifier: Diagnosis of  By: Damita Dunnings MD, Phillip Heal    . Hyperlipidemia   . HYPERTENSION, BENIGN SYSTEMIC 03/13/2006   Qualifier: Diagnosis of  By: Damita Dunnings MD, Phillip Heal    . Hypokalemia 04/30/2014  . Hyponatremia 04/30/2014  . Hypotension 04/30/2014  . Hypoxemia 06/26/2012  . Hypoxia 03/26/2018  . Irritable bowel syndrome 03/13/2006   Qualifier: Diagnosis of  By: Damita Dunnings MD, Phillip Heal    . Lung nodule 04/16/2014  . MENOPAUSAL SYNDROME 03/13/2006   Qualifier: Diagnosis of  By: Damita Dunnings MD, Phillip Heal    . Mood disorder [facility]) 03/13/2006   Qualifier: Diagnosis of  By: Damita Dunnings MD, Phillip Heal    . 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 Nausea 04/30/2014  . Nausea vomiting and diarrhea 04/16/2014  . OSTEOARTHRITIS, MULTI SITES 03/13/2006   Qualifier: Diagnosis of  By: Damita Dunnings MD, Phillip Heal    . Pulmonary nodule 04/30/2014  . Respiratory failure ([place]) 01/22/2017  . TOBACCO DEPENDENCE 03/13/2006   Qualifier: Diagnosis of  By: Damita Dunnings MD, Phillip Heal    . Weight loss     Patient Active Problem List   Diagnosis Date Noted  . Altered mental status 07/09/2018  . Acute lower UTI 07/09/2018  . Anemia 07/09/2018  . Bronchiolitis 07/09/2018  . Bronchitis 07/09/2018  . Acute metabolic encephalopathy [id number]  . Contusion of right wrist   . Fall   . Bacteria in urine   . Mild renal insufficiency 03/26/2018  .  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 Atrial fibrillation with RVR ([place]) 03/01/2017  . Acute on chronic respiratory failure with hypoxia ([place]) 03/01/2017  . Fall at home, initial encounter 03/01/2017  . Accidental drug overdose   . Respiratory failure ([place]) 01/22/2017  . AKI (acute kidney injury) ([place]) 04/30/2014  . Pulmonary nodule 04/30/2014  . Essential hypertension 04/30/2014  . Hypokalemia 04/30/2014  . Hyponatremia 04/30/2014  . Hypotension 04/30/2014  . Nausea 04/30/2014  . Lung nodule 04/16/2014  . Chronic hypoxemic respiratory failure  ([place]) 06/26/2012  . COPD (chronic obstructive pulmonary disease) ([place]) 06/26/2012  . HYPERCHOLESTEROLEMIA 03/13/2006  . TOBACCO DEPENDENCE 03/13/2006  . Mood disorder ([place]) 03/13/2006  . HYPERTENSION, BENIGN SYSTEMIC 03/13/2006  . GASTROESOPHAGEAL REFLUX, NO ESOPHAGITIS 03/13/2006  . IRRITABLE BOWEL SYNDROME 03/13/2006  . MENOPAUSAL SYNDROME 03/13/2006  . OSTEOARTHRITIS, MULTI SITES 03/13/2006    Past Surgical History:  Procedure Laterality Date  . EYE SURGERY    . ROTATOR CUFF REPAIR Right 2012     OB History   No obstetric history on file.      Home Medications    Prior to Admission medications   Medication Sig Start Date End Date Taking? Authorizing Provider  albuterol (PROVENTIL HFA;VENTOLIN HFA) 108 (90 BASE) MCG/ACT inhaler Inhale 2 puffs into the lungs every 6 (six) hours as needed for wheezing. 06/09/12   Monika Salk, MD  albuterol (PROVENTIL) (2.5 MG/3ML) 0.083% nebulizer solution Take 3 mLs (2.5 mg total) by nebulization every 2 (two) hours as needed for wheezing. 04/20/14   Rama, Venetia Maxon, MD  apixaban (ELIQUIS) 5 MG TABS tablet Take 1 tablet (5 mg total) by mouth 2 (two) times daily. 03/28/18   Cherene Altes, MD  atorvastatin (LIPITOR) 10 MG tablet Take 1 tablet (10 mg total) by mouth at bedtime. 03/28/18   Cherene Altes, MD  clopidogrel (PLAVIX) 75 MG tablet Take 75 mg by mouth daily.  08/11/18   [provider]  diltiazem (CARDIZEM CD) 300 MG 24 hr capsule Take 1 capsule (300 mg total) by mouth daily. 03/28/18 03/28/19  Cherene Altes, MD  feeding supplement, ENSURE ENLIVE, (ENSURE ENLIVE) LIQD Take 237 mLs by mouth 2 (two) times daily between meals. Patient taking differently: Take 237 mLs by mouth daily.  03/03/17   Aline August, MD  fluticasone (FLONASE) 50 MCG/ACT nasal spray Place 2 sprays into both nostrils daily. 07/09/18   Eugenie Filler, MD  furosemide (LASIX) 20 MG tablet Take 20 mg by mouth daily.  08/04/18   [provider]   ibuprofen (ADVIL) 200 MG tablet Take 400 mg by mouth every 6 (six) hours as needed for moderate pain.    [provider]  LORazepam (ATIVAN) 0.5 MG tablet Take 0.5 mg by mouth daily.     [provider]  mometasone-formoterol (DULERA) 200-5 MCG/ACT AERO Inhale 2 puffs into the lungs 2 (two) times daily. Patient not taking: Reported on 07/09/2018 03/28/18   Cherene Altes, MD  ondansetron (ZOFRAN) 4 MG tablet Take 1 tablet (4 mg total) by mouth every 6 (six) hours. Patient taking differently: Take 4 mg by mouth every 8 (eight) hours as needed for nausea.  04/08/18   Blanchie Dessert, MD  Oxycodone HCl 10 MG TABS Take 10 mg by mouth 2 (two) times daily.  03/23/18   [provider]  potassium chloride SA (K-DUR) 20 MEQ tablet Take 20 mEq by mouth as needed (potassium and salt level get low).  05/23/18  [provider]  senna (SENOKOT) 8.6 MG TABS tablet Take 1 tablet (8.6 mg total) by mouth at bedtime. Patient taking differently: Take 1 tablet by mouth every other day.  07/09/18   Eugenie Filler, MD  umeclidinium bromide (INCRUSE ELLIPTA) 62.5 MCG/INH AEPB Inhale 1 puff into the lungs daily. Patient not taking: Reported on 07/09/2018 03/29/18   Cherene Altes, MD    Family History Family History  Problem Relation Age of Onset  . CAD Father   . Lung cancer Brother   . Healthy Son   . Healthy Daughter   . Healthy Daughter     Social History Social History   Tobacco Use  . Smoking status: Current Every Day Smoker    Packs/day: 1.00    Types: Cigarettes  . Smokeless tobacco: Never Used  . Tobacco comment: states she is trying to quit  Substance Use Topics  . Alcohol use: No    Frequency: Never    Comment: not in a long time  . Drug use: No     Allergies   Patient has no known allergies.   Review of Systems Review of Systems  All other systems reviewed and are negative.    Physical Exam Updated Vital Signs BP 106/88   Pulse 73    Temp 97.9 F (36.6 C) (Oral)   Resp (!) 23   SpO2 100%   Physical Exam Vitals signs and nursing note reviewed.  Constitutional:      Appearance: She is well-developed.     Comments: Chronically ill appearing  HENT:     Head: Normocephalic and atraumatic.     Mouth/Throat:     Mouth: Mucous membranes are dry.  Cardiovascular:     Rate and Rhythm: Normal rate and regular rhythm.     Heart sounds: No murmur.  Pulmonary:     Effort: Pulmonary effort is normal. No respiratory distress.     Breath sounds: Normal breath sounds.  Abdominal:     Palpations: Abdomen is soft.     Tenderness: There is no guarding or rebound.     Comments: Mild lower abdominal tenderness  Musculoskeletal:        General: No swelling or tenderness.  Skin:    General: Skin is warm and dry.  Neurological:     Mental Status: She is alert and oriented to person, place, and time.     Comments: Generalized weakness  Psychiatric:        Behavior: Behavior normal.      ED Treatments / Results  Labs (all labs ordered are listed, but only abnormal results are displayed) Labs Reviewed  COMPREHENSIVE METABOLIC PANEL - Abnormal; Notable for the following components:      Result Value   Sodium 131 (*)    Potassium 2.8 (*)    Chloride 88 (*)    Glucose, Bld 122 (*)    All other components within normal limits  I-STAT CHEM 8, ED - Abnormal; Notable for the following components:   Sodium 130 (*)    Potassium 2.8 (*)    Chloride 88 (*)    Glucose, Bld 118 (*)    Calcium, Ion 0.88 (*)    TCO2 33 (*)    All other components within normal limits  SARS CORONAVIRUS 2 (TAT 6-24 HRS)  URINE CULTURE  CBC WITH DIFFERENTIAL/PLATELET  MAGNESIUM  LIPASE, BLOOD  URINALYSIS, ROUTINE W REFLEX MICROSCOPIC    EKG EKG Interpretation  Date/Time:  Monday December 21 2018  16:42:34 EST Ventricular Rate:  85 PR Interval:  118 QRS Duration: 90 QT Interval:  644 QTC Calculation: 766 R Axis:   17 Text Interpretation:  Prolonged QT Sinus rhythm with Premature supraventricular complexes Possible Inferior infarct , age undetermined Anterior infarct , age undetermined Prolonged QT Abnormal ECG Confirmed by Quintella Reichert [id number]) on 12/21/2018 5:44:52 PM   Radiology Ct Abdomen Pelvis W Contrast  Result Date: 12/22/2018 CLINICAL DATA:  Lower abdominal pain in vomiting x1 week. EXAM: CT ABDOMEN AND PELVIS WITH CONTRAST TECHNIQUE: Multidetector CT imaging of the abdomen and pelvis was performed using the standard protocol following bolus administration of intravenous contrast. CONTRAST:  169mL OMNIPAQUE IOHEXOL 300 MG/ML  SOLN COMPARISON:  CT dated 08/20/2018 FINDINGS: Lower chest: The lung bases are clear. The heart size is normal. Hepatobiliary: The liver is normal. Normal gallbladder.There is no biliary ductal dilation. Pancreas: Again noted is dilatation of the distal pancreatic duct within abrupt cutoff at the level of the pancreatic body. Spleen: The spleen is again enlarged measuring approximately 12 cm craniocaudad. Adrenals/Urinary Tract: --Adrenal glands: No adrenal hemorrhage. --Right kidney/ureter: No hydronephrosis or perinephric hematoma. --Left kidney/ureter: There are nonobstructing stones in the upper pole the left kidney. --Urinary bladder: Unremarkable. Stomach/Bowel: --Stomach/Duodenum: No hiatal hernia or other gastric abnormality. Normal duodenal course and caliber. --Small bowel: No dilatation or inflammation. --Colon: There is a large stool burden. There is a large amount of stool at the level of the rectum. --Appendix: Not visualized. No right lower quadrant inflammation or free fluid. Vascular/Lymphatic: Atherosclerotic calcification is present within the non-aneurysmal abdominal aorta, without hemodynamically significant stenosis. --No retroperitoneal lymphadenopathy. --No mesenteric lymphadenopathy. --No pelvic or inguinal lymphadenopathy. Reproductive: Status post hysterectomy. No adnexal mass. Other:  No ascites or free air. The abdominal wall is normal. Musculoskeletal. There are advanced degenerative changes throughout the visualized lumbar spine. There is a grade 1 anterolisthesis of L5 on S1 secondary to degenerative changes. There are subacute anterior rib fractures on the right. There are old healed bilateral rib fractures IMPRESSION: 1. Large amount of stool at the level of the rectum. Correlate for symptoms of fecal impaction. 2. Persistent dilatation of the distal pancreatic duct with abrupt cutoff at the level of the pancreatic body. Further evaluation with a nonemergent outpatient contrast enhanced MRI is recommended. 3. Nonobstructive left nephrolithiasis. 4. Subacute anterior right rib fractures. Aortic Atherosclerosis (ICD10-I70.0). Electronically Signed   By: Constance Holster M.D.   On: 12/22/2018 00:03   Dg Chest Port 1 View  Result Date: 12/21/2018 CLINICAL DATA:  Nausea vomiting for 1 week EXAM: PORTABLE CHEST 1 VIEW COMPARISON:  08/20/2018 FINDINGS: Cardiac shadow is stable. Aortic calcifications are again seen and stable. The lungs are well aerated bilaterally. No focal infiltrate or sizable effusion is seen. No acute bony abnormality is noted. IMPRESSION: No active disease. Electronically Signed   By: Inez Catalina M.D.   On: 12/21/2018 18:44    Procedures Procedures (including critical care time) CRITICAL CARE Performed by: Quintella Reichert   Total critical care time: 40 minutes  Critical care time was exclusive of separately billable procedures and treating other patients.  Critical care was necessary to treat or prevent imminent or life-threatening deterioration.  Critical care was time spent personally by me on the following activities: development of treatment plan with patient and/or surrogate as well as nursing, discussions with consultants, evaluation of patient's response to treatment, examination of patient, obtaining history from patient or surrogate, ordering and  performing treatments and interventions, ordering and review  of laboratory studies, ordering and review of radiographic studies, pulse oximetry and re-evaluation of patient's condition.  Medications Ordered in ED Medications  potassium chloride 10 mEq in 100 mL IVPB (0 mEq Intravenous Stopped 12/21/18 2336)  sodium phosphate (FLEET) 7-19 GM/118ML enema 1 enema (has no administration in time range)  magnesium sulfate IVPB 2 g 50 mL (0 g Intravenous Stopped 12/21/18 2109)  sodium chloride 0.9 % bolus 1,000 mL (0 mLs Intravenous Stopped 12/21/18 2237)  fentaNYL (SUBLIMAZE) injection 50 mcg (50 mcg Intravenous Given 12/21/18 2214)  LORazepam (ATIVAN) injection 0.5 mg (0.5 mg Intravenous Given 12/21/18 2218)  iohexol (OMNIPAQUE) 300 MG/ML solution 100 mL (100 mLs Intravenous Contrast Given 12/21/18 2334)     Initial Impression / Assessment and Plan / ED Course  I have reviewed the triage vital signs and the nursing notes.  Pertinent labs & imaging results that were available during my care of the patient were reviewed by me and considered in my medical decision making (see chart for details).        Patient here for evaluation of nausea, weakness and near syncopal events. She is chronically ill appearing on evaluation, dehydrated appearing. EKG with critical QT interval. Labs significant for hypokalemia. She is treated with IV fluids, magnesium and potassium replacement. CT scan of her abdomen obtained given abdominal tenderness on examination. CT scan demonstrates possible fecal impaction, large stool burden. Discussed with patient these findings and she does report that she has been constipated recently, will treat. Given electrolyte abnormalities and EKG changes plan to admit for further management. Hospitalist consulted for admission. Final Clinical Impressions(s) / ED Diagnoses   Final diagnoses:  Prolonged Q-T interval on ECG  Nausea  Dehydration    ED Discharge Orders    None        Quintella Reichert, MD 12/22/18 [phone]

## 2018-12-21 NOTE — ED Triage Notes (Signed)
Pt arrives from home via [facility] with c/c of n/v generalized body aches and chills for 2 days.

## 2018-12-21 NOTE — ED Notes (Signed)
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 hasnt had anything since this morning.

## 2018-12-21 NOTE — ED Notes (Signed)
Pt states she is feeling SOB, no resp distress noted, oxygen saturation 97% on room air, pt given 2L [place] for comfort

## 2018-12-22 ENCOUNTER — Observation Stay (HOSPITAL_COMMUNITY): Payer: Medicare HMO

## 2018-12-22 DIAGNOSIS — I480 Paroxysmal atrial fibrillation: Secondary | ICD-10-CM | POA: Diagnosis present

## 2018-12-22 DIAGNOSIS — F1190 Opioid use, unspecified, uncomplicated: Secondary | ICD-10-CM | POA: Diagnosis present

## 2018-12-22 DIAGNOSIS — R627 Adult failure to thrive: Secondary | ICD-10-CM | POA: Diagnosis present

## 2018-12-22 DIAGNOSIS — R9431 Abnormal electrocardiogram [ECG] [EKG]: Secondary | ICD-10-CM | POA: Diagnosis present

## 2018-12-22 DIAGNOSIS — E860 Dehydration: Secondary | ICD-10-CM | POA: Diagnosis present

## 2018-12-22 DIAGNOSIS — E785 Hyperlipidemia, unspecified: Secondary | ICD-10-CM | POA: Diagnosis present

## 2018-12-22 DIAGNOSIS — I129 Hypertensive chronic kidney disease with stage 1 through stage 4 chronic kidney disease, or unspecified chronic kidney disease: Secondary | ICD-10-CM | POA: Diagnosis present

## 2018-12-22 DIAGNOSIS — K5641 Fecal impaction: Secondary | ICD-10-CM | POA: Diagnosis present

## 2018-12-22 DIAGNOSIS — K219 Gastro-esophageal reflux disease without esophagitis: Secondary | ICD-10-CM | POA: Diagnosis present

## 2018-12-22 DIAGNOSIS — F17210 Nicotine dependence, cigarettes, uncomplicated: Secondary | ICD-10-CM | POA: Diagnosis present

## 2018-12-22 DIAGNOSIS — I428 Other cardiomyopathies: Secondary | ICD-10-CM | POA: Diagnosis present

## 2018-12-22 DIAGNOSIS — K8689 Other specified diseases of pancreas: Secondary | ICD-10-CM | POA: Diagnosis present

## 2018-12-22 DIAGNOSIS — N182 Chronic kidney disease, stage 2 (mild): Secondary | ICD-10-CM | POA: Diagnosis present

## 2018-12-22 DIAGNOSIS — I2510 Atherosclerotic heart disease of native coronary artery without angina pectoris: Secondary | ICD-10-CM | POA: Diagnosis present

## 2018-12-22 DIAGNOSIS — G9341 Metabolic encephalopathy: Secondary | ICD-10-CM | POA: Diagnosis present

## 2018-12-22 DIAGNOSIS — E871 Hypo-osmolality and hyponatremia: Secondary | ICD-10-CM

## 2018-12-22 DIAGNOSIS — K5900 Constipation, unspecified: Secondary | ICD-10-CM | POA: Diagnosis present

## 2018-12-22 DIAGNOSIS — I252 Old myocardial infarction: Secondary | ICD-10-CM | POA: Diagnosis not present

## 2018-12-22 DIAGNOSIS — R112 Nausea with vomiting, unspecified: Secondary | ICD-10-CM

## 2018-12-22 DIAGNOSIS — E876 Hypokalemia: Secondary | ICD-10-CM

## 2018-12-22 DIAGNOSIS — G9340 Encephalopathy, unspecified: Secondary | ICD-10-CM | POA: Diagnosis not present

## 2018-12-22 DIAGNOSIS — J449 Chronic obstructive pulmonary disease, unspecified: Secondary | ICD-10-CM | POA: Diagnosis present

## 2018-12-22 DIAGNOSIS — J9611 Chronic respiratory failure with hypoxia: Secondary | ICD-10-CM | POA: Diagnosis present

## 2018-12-22 DIAGNOSIS — Z20828 Contact with and (suspected) exposure to other viral communicable diseases: Secondary | ICD-10-CM | POA: Diagnosis present

## 2018-12-22 DIAGNOSIS — G8929 Other chronic pain: Secondary | ICD-10-CM | POA: Diagnosis present

## 2018-12-22 DIAGNOSIS — K2970 Gastritis, unspecified, without bleeding: Secondary | ICD-10-CM | POA: Diagnosis present

## 2018-12-22 DIAGNOSIS — F419 Anxiety disorder, unspecified: Secondary | ICD-10-CM | POA: Diagnosis present

## 2018-12-22 LAB — COMPREHENSIVE METABOLIC PANEL
ALT: 9 U/L (ref 0–44)
AST: 19 U/L (ref 15–41)
Albumin: 4 g/dL (ref 3.5–5.0)
Alkaline Phosphatase: 115 U/L (ref 38–126)
Anion gap: 20 — ABNORMAL HIGH (ref 5–15)
BUN: 7 mg/dL — ABNORMAL LOW (ref 8–23)
CO2: 28 mmol/L (ref 22–32)
Calcium: 9 mg/dL (ref 8.9–10.3)
Chloride: 89 mmol/L — ABNORMAL LOW (ref 98–111)
Creatinine, Ser: 0.93 mg/dL (ref 0.44–1.00)
GFR calc Af Amer: >60 mL/min (ref >60)
GFR calc non Af Amer: >60 mL/min (ref >60)
Glucose, Bld: 135 mg/dL — ABNORMAL HIGH (ref 70–99)
Potassium: 2.8 mmol/L — ABNORMAL LOW (ref 3.5–5.1)
Sodium: 137 mmol/L (ref 135–145)
Total Bilirubin: 0.5 mg/dL (ref 0.3–1.2)
Total Protein: 6.9 g/dL (ref 6.5–8.1)

## 2018-12-22 LAB — URINE CULTURE: Culture: <10000 — AB

## 2018-12-22 LAB — CBC
HCT: 41.7 % (ref 36.0–46.0)
Hemoglobin: 15 g/dL (ref 12.0–15.0)
MCH: 29.8 pg (ref 26.0–34.0)
MCHC: 36 g/dL (ref 30.0–36.0)
MCV: 82.7 fL (ref 80.0–100.0)
Platelets: 343 10*3/uL (ref 150–400)
RBC: 5.04 MIL/uL (ref 3.87–5.11)
RDW: 12.4 % (ref 11.5–15.5)
WBC: 10 10*3/uL (ref 4.0–10.5)
nRBC: 0 % (ref 0.0–0.2)

## 2018-12-22 LAB — RAPID URINE DRUG SCREEN, HOSP PERFORMED
Amphetamines: NOT DETECTED
Barbiturates: NOT DETECTED
Benzodiazepines: NOT DETECTED
Cocaine: NOT DETECTED
Opiates: POSITIVE — AB
Tetrahydrocannabinol: NOT DETECTED

## 2018-12-22 MED ORDER — POLYETHYLENE GLYCOL 3350 17 G PO PACK
17.0000 g | PACK | Freq: Every day | ORAL | Status: DC
  Administered 2018-12-22: 17 g via ORAL
  Filled 2018-12-22 (×2): qty 1

## 2018-12-22 MED ORDER — LOPERAMIDE HCL 2 MG PO CAPS
2.0000 mg | ORAL_CAPSULE | ORAL | Status: DC | PRN
  Administered 2018-12-22: 4 mg via ORAL
  Administered 2018-12-22: 2 mg via ORAL
  Filled 2018-12-22 (×2): qty 2

## 2018-12-22 MED ORDER — PANTOPRAZOLE SODIUM 40 MG PO TBEC
40.0000 mg | DELAYED_RELEASE_TABLET | Freq: Every day | ORAL | Status: DC
  Administered 2018-12-22 – 2018-12-26 (×5): 40 mg via ORAL
  Filled 2018-12-22 (×5): qty 1

## 2018-12-22 MED ORDER — CLONIDINE HCL 0.1 MG PO TABS
0.1000 mg | ORAL_TABLET | Freq: Every day | ORAL | Status: DC
  Administered 2018-12-26: 0.1 mg via ORAL
  Filled 2018-12-22: qty 1

## 2018-12-22 MED ORDER — FLEET ENEMA 7-19 GM/118ML RE ENEM
1.0000 | ENEMA | Freq: Once | RECTAL | Status: AC
  Administered 2018-12-22: 1 via RECTAL
  Filled 2018-12-22: qty 1

## 2018-12-22 MED ORDER — FLUTICASONE PROPIONATE 50 MCG/ACT NA SUSP
2.0000 | Freq: Every day | NASAL | Status: DC
  Administered 2018-12-22 – 2018-12-26 (×5): 2 via NASAL
  Filled 2018-12-22: qty 16

## 2018-12-22 MED ORDER — UMECLIDINIUM BROMIDE 62.5 MCG/INH IN AEPB
1.0000 | INHALATION_SPRAY | Freq: Every day | RESPIRATORY_TRACT | Status: DC
  Administered 2018-12-22 – 2018-12-26 (×5): 1 via RESPIRATORY_TRACT
  Filled 2018-12-22: qty 7

## 2018-12-22 MED ORDER — DICYCLOMINE HCL 20 MG PO TABS
20.0000 mg | ORAL_TABLET | Freq: Four times a day (QID) | ORAL | Status: DC | PRN
  Administered 2018-12-22 – 2018-12-24 (×4): 20 mg via ORAL
  Filled 2018-12-22 (×4): qty 1

## 2018-12-22 MED ORDER — NAPROXEN 250 MG PO TABS: 500.0000 mg | ORAL_TABLET | Freq: Two times a day (BID) | ORAL | Status: DC | PRN

## 2018-12-22 MED ORDER — METHOCARBAMOL 500 MG PO TABS
500.0000 mg | ORAL_TABLET | Freq: Three times a day (TID) | ORAL | Status: DC | PRN
  Administered 2018-12-23: 17:00:00 500 mg via ORAL
  Filled 2018-12-22 (×2): qty 1

## 2018-12-22 MED ORDER — CLONIDINE HCL 0.1 MG PO TABS
0.1000 mg | ORAL_TABLET | ORAL | Status: AC
  Administered 2018-12-24 – 2018-12-25 (×4): 0.1 mg via ORAL
  Filled 2018-12-22 (×4): qty 1

## 2018-12-22 MED ORDER — OXYCODONE HCL 5 MG PO TABS
10.0000 mg | ORAL_TABLET | Freq: Two times a day (BID) | ORAL | Status: DC
  Administered 2018-12-22 – 2018-12-26 (×10): 10 mg via ORAL
  Filled 2018-12-22 (×10): qty 2

## 2018-12-22 MED ORDER — CLOPIDOGREL BISULFATE 75 MG PO TABS
75.0000 mg | ORAL_TABLET | Freq: Every day | ORAL | Status: DC
  Administered 2018-12-22 – 2018-12-25 (×4): 75 mg via ORAL
  Filled 2018-12-22 (×4): qty 1

## 2018-12-22 MED ORDER — SENNA 8.6 MG PO TABS: 1.0000 | ORAL_TABLET | Freq: Every day | ORAL | Status: DC

## 2018-12-22 MED ORDER — POTASSIUM CHLORIDE CRYS ER 20 MEQ PO TBCR
40.0000 meq | EXTENDED_RELEASE_TABLET | ORAL | Status: AC
  Administered 2018-12-22 (×2): 40 meq via ORAL
  Filled 2018-12-22: qty 2

## 2018-12-22 MED ORDER — HYDROXYZINE HCL 25 MG PO TABS
25.0000 mg | ORAL_TABLET | Freq: Four times a day (QID) | ORAL | Status: DC | PRN
  Administered 2018-12-22 – 2018-12-26 (×6): 25 mg via ORAL
  Filled 2018-12-22 (×6): qty 1

## 2018-12-22 MED ORDER — ALBUTEROL SULFATE HFA 108 (90 BASE) MCG/ACT IN AERS
2.0000 | INHALATION_SPRAY | Freq: Four times a day (QID) | RESPIRATORY_TRACT | Status: DC | PRN
  Filled 2018-12-22: qty 6.7

## 2018-12-22 MED ORDER — ENSURE ENLIVE PO LIQD
237.0000 mL | ORAL | Status: DC
  Administered 2018-12-22 – 2018-12-25 (×4): 237 mL via ORAL
  Filled 2018-12-22: qty 237

## 2018-12-22 MED ORDER — POTASSIUM CHLORIDE IN NACL 20-0.9 MEQ/L-% IV SOLN
INTRAVENOUS | Status: AC
  Administered 2018-12-22: 04:00:00 via INTRAVENOUS
  Filled 2018-12-22: qty 1000

## 2018-12-22 MED ORDER — ACETAMINOPHEN 325 MG PO TABS
650.0000 mg | ORAL_TABLET | Freq: Four times a day (QID) | ORAL | Status: DC | PRN
  Administered 2018-12-23 – 2018-12-24 (×2): 650 mg via ORAL
  Filled 2018-12-22 (×2): qty 2

## 2018-12-22 MED ORDER — PROCHLORPERAZINE EDISYLATE 10 MG/2ML IJ SOLN: 10.0000 mg | Freq: Four times a day (QID) | INTRAMUSCULAR | Status: DC | PRN

## 2018-12-22 MED ORDER — MINERAL OIL RE ENEM
1.0000 | ENEMA | Freq: Once | RECTAL | Status: AC
  Administered 2018-12-22: 1 via RECTAL
  Filled 2018-12-22: qty 1

## 2018-12-22 MED ORDER — ACETAMINOPHEN 650 MG RE SUPP: 650.0000 mg | Freq: Four times a day (QID) | RECTAL | Status: DC | PRN

## 2018-12-22 MED ORDER — DILTIAZEM HCL ER COATED BEADS 180 MG PO CP24
300.0000 mg | ORAL_CAPSULE | Freq: Every day | ORAL | Status: DC
  Administered 2018-12-22 – 2018-12-23 (×2): 300 mg via ORAL
  Filled 2018-12-22 (×2): qty 1

## 2018-12-22 MED ORDER — ONDANSETRON 4 MG PO TBDP: 4.0000 mg | ORAL_TABLET | Freq: Four times a day (QID) | ORAL | Status: DC | PRN

## 2018-12-22 MED ORDER — LORAZEPAM 0.5 MG PO TABS
0.5000 mg | ORAL_TABLET | Freq: Every day | ORAL | Status: DC
  Administered 2018-12-22 – 2018-12-26 (×5): 0.5 mg via ORAL
  Filled 2018-12-22 (×5): qty 1

## 2018-12-22 MED ORDER — ENOXAPARIN SODIUM 40 MG/0.4ML ~~LOC~~ SOLN: 40.0000 mg | Freq: Every day | SUBCUTANEOUS | Status: DC

## 2018-12-22 MED ORDER — CLONIDINE HCL 0.1 MG PO TABS
0.1000 mg | ORAL_TABLET | Freq: Four times a day (QID) | ORAL | Status: AC
  Administered 2018-12-22 – 2018-12-23 (×7): 0.1 mg via ORAL
  Filled 2018-12-22 (×7): qty 1

## 2018-12-22 MED ORDER — ATORVASTATIN CALCIUM 10 MG PO TABS
10.0000 mg | ORAL_TABLET | Freq: Every day | ORAL | Status: DC
  Administered 2018-12-22 – 2018-12-25 (×4): 10 mg via ORAL
  Filled 2018-12-22 (×4): qty 1

## 2018-12-22 MED ORDER — APIXABAN 5 MG PO TABS
5.0000 mg | ORAL_TABLET | Freq: Two times a day (BID) | ORAL | Status: DC
  Administered 2018-12-22 – 2018-12-23 (×3): 5 mg via ORAL
  Filled 2018-12-22 (×5): qty 1

## 2018-12-22 MED ORDER — MOMETASONE FURO-FORMOTEROL FUM 200-5 MCG/ACT IN AERO
2.0000 | INHALATION_SPRAY | Freq: Two times a day (BID) | RESPIRATORY_TRACT | Status: DC
  Administered 2018-12-22 – 2018-12-26 (×8): 2 via RESPIRATORY_TRACT
  Filled 2018-12-22: qty 8.8

## 2018-12-22 MED ORDER — FUROSEMIDE 20 MG PO TABS
20.0000 mg | ORAL_TABLET | Freq: Every day | ORAL | Status: DC
  Administered 2018-12-22 – 2018-12-23 (×2): 20 mg via ORAL
  Filled 2018-12-22 (×2): qty 1

## 2018-12-22 NOTE — Progress Notes (Addendum)
Patient admitted to obs after midnight. Care began before midnight. Presented with nausea/ vomiting for 3 days. Work up reveals likely gastritis, hyponatremia, hypokalemia, fecal impaction, tachycardia acute encephalopathy.  No nausea vomiting today. Several loose stools since enemas. Quite agitated and restless today. MRCP ordered but patient unable to cooperate  Acute encephalopathy. Likely related to withdrawal in setting of fecal impaction and metabolic derangement. Agitated and irritable today. Frequent stools from enemas. Moves all extremities. Able to bear weight.  -iv fluids -address electrolyte imbalance -clonidine protocol -continue oxy -safety sitter -monitor  N/v ? Gastritis no nausea vomiting today. Requesting food Compazine 10mg  iv q6h prn  Start Protonix 40mg  po qday  Fecal impaction. Digitally disimpacted. Frequent stools.  -hold colace  Dilated pancreatic duct. 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  Pt counselled on cessation Social work consult for substance abuse clinic referral  Chronic back pain Cont home oxycodone for now  Anxiety Lorazepam 0.5mg  po qday   Pafib. ekg with ST at 100 on admission. Today HR 120-130. Has not yet received cardizem (2pm).  Cont Cardizem CD 300mg  po qday Cont eliquis pharmacy to dose  CAD  Cont Plavix 75mg  po qday Cont Lipitor 10mg  po qhs  Edema Cont Lasix 20mg  po qday  Copd Cont Dulera 2puff bid Cont Albuterol HFA 2puff q6h prn Cont Incruse 1puff qday  [name], NP   There is ? Of patient reporting heroin use.  Family denies.  She is prescribed oxycodone by her PCP: [name].   She appears to be confused and possibly withdrawing from something.  UDS pending but not sure it will help.   Patient is still altered and  tachycardic- will change to inpatient.    [name] DO

## 2018-12-22 NOTE — H&P (Addendum)
TRH H&P    Patient Demographics:    Allison Vaughan, is a 73 y.o. female  MRN: [id number]  DOB - Mar 15, 1945  Admit Date - 12/21/2018  Referring MD/NP/PA:  Ralene Bathe  Outpatient Primary MD for the patient is [name], MD  Patient coming from:  home  Chief complaint- n/v    HPI:    Allison Vaughan  is a 73 y.o. 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    In Ed,  T 98.4, P 83  R 18, Bp 173/96  Pox 100% on RA  CT abd/ pelvis IMPRESSION: 1. Large amount of stool at the level of the rectum. Correlate for symptoms of fecal impaction. 2. Persistent dilatation of the distal pancreatic duct with abrupt cutoff at the level of the pancreatic body. Further evaluation with a nonemergent outpatient contrast enhanced MRI is recommended. 3. Nonobstructive left nephrolithiasis. 4. Subacute anterior right rib fractures.  Na 131, K 2.8, Bun 9, Creatinine 0.93 Ast 16, Alt 9 Wbc 4.2, Hgb 13.7, Plt 271 Magnesium 1.7 Lipase 17 Urinalysis negative   Pt will be admitted for w/up of hypokalemia, n/v, fecal impaction, and dilation of pancreatic duct      Review of systems:    In addition to the HPI above,  No Fever-chills, No Headache, No changes with Vision or hearing, No problems swallowing food or Liquids, No Chest pain, Cough or Shortness of Breath, No Abdominal pain,  No Blood in stool or Urine, No dysuria, No new skin rashes or bruises, No new joints pains-aches,  No new weakness, tingling, numbness in any extremity, No recent weight gain or loss, No polyuria, polydypsia or polyphagia, No significant Mental Stressors.  All other systems reviewed and are negative.    Past History of the following :    Past Medical History:  Diagnosis Date  . Accidental drug  overdose   . Acute encephalopathy   . Acute on chronic respiratory failure with hypoxia ([place]) 03/01/2017  . Acute renal failure ([place]) 04/16/2014  . AKI (acute kidney injury) ([place]) 04/30/2014  . Altered mental status 06/27/2012  . Anxiety   . Atrial fibrillation ([place])   . CAD (coronary artery disease)   . CAP (community acquired pneumonia) 04/18/2014  . Cardiomyopathy ([place])   . Chronic hypoxemic respiratory failure ([place]) 06/26/2012  . Chronic pain   . COPD exacerbation ([place]) 06/26/2012  . COPD with acute exacerbation ([place]) 03/01/2017  . DEPRESSIVE DISORDER, NOS 03/13/2006   Qualifier: Diagnosis of  By: Damita Dunnings MD, Phillip Heal    . Edema, peripheral   . Essential hypertension 04/30/2014  . Fall at home, initial encounter 03/01/2017  . GASTROESOPHAGEAL REFLUX, NO ESOPHAGITIS 03/13/2006   Qualifier: Diagnosis of  By: Damita Dunnings MD, Phillip Heal    . HYPERCHOLESTEROLEMIA 03/13/2006   Qualifier: Diagnosis of  By: Damita Dunnings MD, Phillip Heal    . Hyperlipidemia   . HYPERTENSION, BENIGN SYSTEMIC 03/13/2006   Qualifier: Diagnosis of  By:  Damita Dunnings MD, Phillip Heal    . Hypokalemia 04/30/2014  . Hyponatremia 04/30/2014  . Hypotension 04/30/2014  . Hypoxemia 06/26/2012  . Hypoxia 03/26/2018  . Irritable bowel syndrome 03/13/2006   Qualifier: Diagnosis of  By: Damita Dunnings MD, Phillip Heal    . Lung nodule 04/16/2014  . MENOPAUSAL SYNDROME 03/13/2006   Qualifier: Diagnosis of  By: Damita Dunnings MD, Phillip Heal    . Mood disorder [facility]) 03/13/2006   Qualifier: Diagnosis of  By: Damita Dunnings MD, Phillip Heal    . 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 Nausea 04/30/2014  . Nausea vomiting and diarrhea 04/16/2014  . OSTEOARTHRITIS, MULTI SITES 03/13/2006   Qualifier: Diagnosis of  By: Damita Dunnings MD, Phillip Heal    . Pulmonary nodule 04/30/2014  . Respiratory failure ([place]) 01/22/2017  . TOBACCO DEPENDENCE 03/13/2006   Qualifier: Diagnosis of  By: Damita Dunnings MD, Phillip Heal    . Weight loss       Past Surgical History:  Procedure Laterality Date  . EYE SURGERY    . ROTATOR CUFF REPAIR Right 2012       Social History:      Social History   Tobacco Use  . Smoking status: Current Every Day Smoker    Packs/day: 1.00    Types: Cigarettes  . Smokeless tobacco: Never Used  . Tobacco comment: states she is trying to quit  Substance Use Topics  . Alcohol use: No    Frequency: Never    Comment: not in a long time       Family History :     Family History  Problem Relation Age of Onset  . CAD Father   . Lung cancer Brother   . Healthy Son   . Healthy Daughter   . Healthy Daughter        Home Medications:   Prior to Admission medications   Medication Sig Start Date End Date Taking? Authorizing Provider  albuterol (PROVENTIL HFA;VENTOLIN HFA) 108 (90 BASE) MCG/ACT inhaler Inhale 2 puffs into the lungs every 6 (six) hours as needed for wheezing. 06/09/12   Monika Salk, MD  albuterol (PROVENTIL) (2.5 MG/3ML) 0.083% nebulizer solution Take 3 mLs (2.5 mg total) by nebulization every 2 (two) hours as needed for wheezing. 04/20/14   Rama, Venetia Maxon, MD  apixaban (ELIQUIS) 5 MG TABS tablet Take 1 tablet (5 mg total) by mouth 2 (two) times daily. 03/28/18   Cherene Altes, MD  atorvastatin (LIPITOR) 10 MG tablet Take 1 tablet (10 mg total) by mouth at bedtime. 03/28/18   Cherene Altes, MD  clopidogrel (PLAVIX) 75 MG tablet Take 75 mg by mouth daily.  08/11/18   [provider]  diltiazem (CARDIZEM CD) 300 MG 24 hr capsule Take 1 capsule (300 mg total) by mouth daily. 03/28/18 03/28/19  Cherene Altes, MD  feeding supplement, ENSURE ENLIVE, (ENSURE ENLIVE) LIQD Take 237 mLs by mouth 2 (two) times daily between meals. Patient taking differently: Take 237 mLs by mouth daily.  03/03/17   Aline August, MD  fluticasone (FLONASE) 50 MCG/ACT nasal spray Place 2 sprays into both nostrils daily. 07/09/18   Eugenie Filler, MD  furosemide (LASIX) 20 MG tablet Take 20 mg by mouth daily.  08/04/18   [provider]  ibuprofen (ADVIL) 200 MG tablet Take 400 mg by mouth  every 6 (six) hours as needed for moderate pain.    [provider]  LORazepam (ATIVAN) 0.5 MG tablet Take 0.5 mg by mouth daily.     [provider]  mometasone-formoterol (DULERA) 200-5 MCG/ACT AERO Inhale 2 puffs into the lungs 2 (two) times daily. Patient not taking: Reported on 07/09/2018 03/28/18   Cherene Altes, MD  ondansetron (ZOFRAN) 4 MG tablet Take 1 tablet (4 mg total) by mouth every 6 (six) hours. Patient taking differently: Take 4 mg by mouth every 8 (eight) hours as needed for nausea.  04/08/18   Blanchie Dessert, MD  Oxycodone HCl 10 MG TABS Take 10 mg by mouth 2 (two) times daily.  03/23/18   [provider]  potassium chloride SA (K-DUR) 20 MEQ tablet Take 20 mEq by mouth as needed (potassium and salt level get low).  05/23/18   [provider]  senna (SENOKOT) 8.6 MG TABS tablet Take 1 tablet (8.6 mg total) by mouth at bedtime. Patient taking differently: Take 1 tablet by mouth every other day.  07/09/18   Eugenie Filler, MD  umeclidinium bromide (INCRUSE ELLIPTA) 62.5 MCG/INH AEPB Inhale 1 puff into the lungs daily. Patient not taking: Reported on 07/09/2018 03/29/18   Cherene Altes, MD     Allergies:    No Known Allergies   Physical Exam:   Vitals  Blood pressure 106/88, pulse 73, temperature 97.9 F (36.6 C), temperature source Oral, resp. rate (!) 23, SpO2 100 %.  1.  General: axoxo3  2. Psychiatric: Slightly depressed  3. Neurologic: cn2-12 intact, reflexes 2+ symmetric, diffuse with no clonus, motor 5/5 in all 4 ext  4. HEENMT:  Anicteric, pupils 1.110mm symmetric, direct, consensual Neck: no jvd  5. Respiratory : CTAB  6. Cardiovascular : rrr s1, s2, no m/g/r  7. Gastrointestinal:  ABd: soft, nt, nd, +bs  8. Skin:  Ext: no c/c/e,   9.Musculoskeletal:  Good ROM    Data Review:    CBC Recent Labs  Lab 12/21/18 1657 12/21/18 1718  WBC 4.2  --   HGB 13.7 13.3  HCT 38.6 39.0  PLT 271  --    MCV 82.1  --   MCH 29.1  --   MCHC 35.5  --   RDW 12.1  --   LYMPHSABS 0.8  --   MONOABS 0.6  --   EOSABS 0.0  --   BASOSABS 0.0  --    ------------------------------------------------------------------------------------------------------------------  Results for orders placed or performed during the hospital encounter of 12/21/18 (from the past 48 hour(s))  Comprehensive metabolic panel     Status: Abnormal   Collection Time: 12/21/18  4:57 PM  Result Value Ref Range   Sodium 131 (L) 135 - 145 mmol/L   Potassium 2.8 (L) 3.5 - 5.1 mmol/L   Chloride 88 (L) 98 - 111 mmol/L   CO2 31 22 - 32 mmol/L   Glucose, Bld 122 (H) 70 - 99 mg/dL   BUN 9 8 - 23 mg/dL   Creatinine, Ser 0.93 0.44 - 1.00 mg/dL   Calcium 9.2 8.9 - 10.3 mg/dL   Total Protein 6.5 6.5 - 8.1 g/dL   Albumin 4.0 3.5 - 5.0 g/dL   AST 16 15 - 41 U/L   ALT 9 0 - 44 U/L   Alkaline Phosphatase 108 38 - 126 U/L   Total Bilirubin 0.6 0.3 - 1.2 mg/dL   GFR calc non Af Amer >60 >60 mL/min   GFR calc Af Amer >60 >60 mL/min   Anion gap 12 5 - 15    Comment: Performed at [facility], [place] [address] [place], [place] [address]  CBC with Differential  Status: None   Collection Time: 12/21/18  4:57 PM  Result Value Ref Range   WBC 4.2 4.0 - 10.5 K/uL   RBC 4.70 3.87 - 5.11 MIL/uL   Hemoglobin 13.7 12.0 - 15.0 g/dL   HCT 38.6 36.0 - 46.0 %   MCV 82.1 80.0 - 100.0 fL   MCH 29.1 26.0 - 34.0 pg   MCHC 35.5 30.0 - 36.0 g/dL   RDW 12.1 11.5 - 15.5 %   Platelets 271 150 - 400 K/uL   nRBC 0.0 0.0 - 0.2 %   Neutrophils Relative % 63 %   Neutro Abs 2.7 1.7 - 7.7 K/uL   Lymphocytes Relative 20 %   Lymphs Abs 0.8 0.7 - 4.0 K/uL   Monocytes Relative 15 %   Monocytes Absolute 0.6 0.1 - 1.0 K/uL   Eosinophils Relative 0 %   Eosinophils Absolute 0.0 0.0 - 0.5 K/uL   Basophils Relative 1 %   Basophils Absolute 0.0 0.0 - 0.1 K/uL   Immature Granulocytes 1 %   Abs Immature Granulocytes 0.03 0.00 - 0.07 K/uL    Comment:  Performed at [facility], [address] [place], [place] [address]  Magnesium     Status: None   Collection Time: 12/21/18  4:57 PM  Result Value Ref Range   Magnesium 1.7 1.7 - 2.4 mg/dL    Comment: Performed at [facility], [place] [address], [place], [place] [address]  Lipase, blood     Status: None   Collection Time: 12/21/18  4:57 PM  Result Value Ref Range   Lipase 17 11 - 51 U/L    Comment: Performed at [facility] [address], [place], [place] [address]  I-stat chem 8, ED (not at [facility] or [facility])     Status: Abnormal   Collection Time: 12/21/18  5:18 PM  Result Value Ref Range   Sodium 130 (L) 135 - 145 mmol/L   Potassium 2.8 (L) 3.5 - 5.1 mmol/L   Chloride 88 (L) 98 - 111 mmol/L   BUN 8 8 - 23 mg/dL   Creatinine, Ser 0.80 0.44 - 1.00 mg/dL   Glucose, Bld 118 (H) 70 - 99 mg/dL   Calcium, Ion 0.88 (LL) 1.15 - 1.40 mmol/L   TCO2 33 (H) 22 - 32 mmol/L   Hemoglobin 13.3 12.0 - 15.0 g/dL   HCT 39.0 36.0 - 46.0 %   Comment NOTIFIED PHYSICIAN   Urinalysis, Routine w reflex microscopic     Status: None   Collection Time: 12/21/18  6:54 PM  Result Value Ref Range   Color, Urine YELLOW YELLOW   APPearance CLEAR CLEAR   Specific Gravity, Urine 1.006 1.005 - 1.030   pH 8.0 5.0 - 8.0   Glucose, UA NEGATIVE NEGATIVE mg/dL   Hgb urine dipstick NEGATIVE NEGATIVE   Bilirubin Urine NEGATIVE NEGATIVE   Ketones, ur NEGATIVE NEGATIVE mg/dL   Protein, ur NEGATIVE NEGATIVE mg/dL   Nitrite NEGATIVE NEGATIVE   Leukocytes,Ua NEGATIVE NEGATIVE    Comment: Performed at [facility], [address], [place], [place] [address]  SARS CORONAVIRUS 2 (TAT 6-24 HRS) Nasopharyngeal Urine, Clean Catch     Status: None   Collection Time: 12/21/18  6:54 PM   Specimen: Urine, Clean Catch; Nasopharyngeal  Result Value Ref Range   SARS Coronavirus 2 NEGATIVE NEGATIVE    Comment: (NOTE) SARS-CoV-2 target nucleic acids are NOT DETECTED. The SARS-CoV-2 RNA is generally detectable  in upper  and lower respiratory specimens during the acute phase of infection. Negative results do not preclude SARS-CoV-2 infection, do not rule out co-infections with other pathogens, and should not be used as the sole basis for treatment or other patient management decisions. Negative results must be combined with clinical observations, patient history, and epidemiological information. The expected result is Negative. Fact Sheet for Patients: SugarRoll.be Fact Sheet for Healthcare Providers: https://www.woods-mathews.com/ This test is not yet approved or cleared by the [place] FDA and  has been authorized for detection and/or diagnosis of SARS-CoV-2 by FDA under an Emergency Use Authorization (EUA). This EUA will remain  in effect (meaning this test can be used) for the duration of the COVID-19 declaration under Section 56 4(b)(1) of the Act, 21 U.S.C. section 360bbb-3(b)(1), unless the authorization is terminated or revoked sooner. Performed at [facility], [place] [address] [place], [place] [address]     Chemistries  Recent Labs  Lab 12/21/18 1657 12/21/18 1718  NA 131* 130*  K 2.8* 2.8*  CL 88* 88*  CO2 31  --   GLUCOSE 122* 118*  BUN 9 8  CREATININE 0.93 0.80  CALCIUM 9.2  --   MG 1.7  --   AST 16  --   ALT 9  --   ALKPHOS 108  --   BILITOT 0.6  --    ------------------------------------------------------------------------------------------------------------------  ------------------------------------------------------------------------------------------------------------------ GFR: CrCl cannot be calculated (Unknown ideal weight.). Liver Function Tests: Recent Labs  Lab 12/21/18 1657  AST 16  ALT 9  ALKPHOS 108  BILITOT 0.6  PROT 6.5  ALBUMIN 4.0   Recent Labs  Lab 12/21/18 1657  LIPASE 17   No results for input(s): AMMONIA in the last 168 hours. Coagulation Profile: No results for input(s):  INR, PROTIME in the last 168 hours. Cardiac Enzymes: No results for input(s): CKTOTAL, CKMB, CKMBINDEX, TROPONINI in the last 168 hours. BNP (last 3 results) No results for input(s): PROBNP in the last 8760 hours. HbA1C: No results for input(s): HGBA1C in the last 72 hours. CBG: No results for input(s): GLUCAP in the last 168 hours. Lipid Profile: No results for input(s): CHOL, HDL, LDLCALC, TRIG, CHOLHDL, LDLDIRECT in the last 72 hours. Thyroid Function Tests: No results for input(s): TSH, T4TOTAL, FREET4, T3FREE, THYROIDAB in the last 72 hours. Anemia Panel: No results for input(s): VITAMINB12, FOLATE, FERRITIN, TIBC, IRON, RETICCTPCT in the last 72 hours.  --------------------------------------------------------------------------------------------------------------- Urine analysis:    Component Value Date/Time   COLORURINE YELLOW 12/21/2018 [place] 12/21/2018 1854   LABSPEC 1.006 12/21/2018 1854   PHURINE 8.0 12/21/2018 [place] 12/21/2018 1854   HGBUR NEGATIVE 12/21/2018 [place] NEGATIVE 12/21/2018 [place] 12/21/2018 1854   PROTEINUR NEGATIVE 12/21/2018 1854   UROBILINOGEN 0.2 04/30/2014 2050   NITRITE NEGATIVE 12/21/2018 1854   LEUKOCYTESUR NEGATIVE 12/21/2018 1854      Imaging Results:    Ct Abdomen Pelvis W Contrast  Result Date: 12/22/2018 CLINICAL DATA:  Lower abdominal pain in vomiting x1 week. EXAM: CT ABDOMEN AND PELVIS WITH CONTRAST TECHNIQUE: Multidetector CT imaging of the abdomen and pelvis was performed using the standard protocol following bolus administration of intravenous contrast. CONTRAST:  164mL OMNIPAQUE IOHEXOL 300 MG/ML  SOLN COMPARISON:  CT dated 08/20/2018 FINDINGS: Lower chest: The lung bases are clear. The heart size is normal. Hepatobiliary: The liver is normal. Normal gallbladder.There is no biliary ductal dilation. Pancreas: Again noted is dilatation of the distal pancreatic  duct  within abrupt cutoff at the level of the pancreatic body. Spleen: The spleen is again enlarged measuring approximately 12 cm craniocaudad. Adrenals/Urinary Tract: --Adrenal glands: No adrenal hemorrhage. --Right kidney/ureter: No hydronephrosis or perinephric hematoma. --Left kidney/ureter: There are nonobstructing stones in the upper pole the left kidney. --Urinary bladder: Unremarkable. Stomach/Bowel: --Stomach/Duodenum: No hiatal hernia or other gastric abnormality. Normal duodenal course and caliber. --Small bowel: No dilatation or inflammation. --Colon: There is a large stool burden. There is a large amount of stool at the level of the rectum. --Appendix: Not visualized. No right lower quadrant inflammation or free fluid. Vascular/Lymphatic: Atherosclerotic calcification is present within the non-aneurysmal abdominal aorta, without hemodynamically significant stenosis. --No retroperitoneal lymphadenopathy. --No mesenteric lymphadenopathy. --No pelvic or inguinal lymphadenopathy. Reproductive: Status post hysterectomy. No adnexal mass. Other: No ascites or free air. The abdominal wall is normal. Musculoskeletal. There are advanced degenerative changes throughout the visualized lumbar spine. There is a grade 1 anterolisthesis of L5 on S1 secondary to degenerative changes. There are subacute anterior rib fractures on the right. There are old healed bilateral rib fractures IMPRESSION: 1. Large amount of stool at the level of the rectum. Correlate for symptoms of fecal impaction. 2. Persistent dilatation of the distal pancreatic duct with abrupt cutoff at the level of the pancreatic body. Further evaluation with a nonemergent outpatient contrast enhanced MRI is recommended. 3. Nonobstructive left nephrolithiasis. 4. Subacute anterior right rib fractures. Aortic Atherosclerosis (ICD10-I70.0). Electronically Signed   By: Constance Holster M.D.   On: 12/22/2018 00:03   Dg Chest Port 1 View  Result Date: 12/21/2018  CLINICAL DATA:  Nausea vomiting for 1 week EXAM: PORTABLE CHEST 1 VIEW COMPARISON:  08/20/2018 FINDINGS: Cardiac shadow is stable. Aortic calcifications are again seen and stable. The lungs are well aerated bilaterally. No focal infiltrate or sizable effusion is seen. No acute bony abnormality is noted. IMPRESSION: No active disease. Electronically Signed   By: Inez Catalina M.D.   On: 12/21/2018 18:44   St at 100, nl axis Q in v1-3   Assessment & Plan:    Active Problems:   Essential hypertension   Hypokalemia   Hyponatremia   Nausea & vomiting   Prolonged QT interval   Constipation  N/v ? Gastritis Compazine 10mg  iv q6h prn  Start Protonix 40mg  po qday  Fecal impaction Mineral oil enema pr x1 miralax 17gm po qday Colace [place]  Dilated pancreatic duct Check MRCP  Hypokalemia Replete Check cmp in am  Hyponatremia Hydrate with ns iv Check cmp in am Consider further w/up with serum osm, cortisol, tsh, urine sodium, urine   Heroin use Pt counselled on cessation Social work consult for substance abuse clinic referral  Chronic back pain Cont home oxycodone for now  Anxiety Lorazepam 0.5mg  po qday   Pafib Cont Cardizem CD 300mg  po qday Cont eliquis pharmacy to dose  CAD  Cont Plavix 75mg  po qday Cont Lipitor 10mg  po qhs  Edema Cont Lasix 20mg  po qday  Copd Cont Dulera 2puff bid Cont Albuterol HFA 2puff q6h prn Cont Incruse 1puff qday     DVT Prophylaxis-  Eliquis - SCDs  AM Labs Ordered, also please review Full Orders  Family Communication: Admission, patients condition and plan of care including tests being ordered have been discussed with the patient  who indicate understanding and agree with the plan and Code Status.  Code Status:  FULL CODE per patient  Admission status: Observation: Based on patients clinical presentation and evaluation of above clinical  data, I have made determination that patient meets observation criteria at this time.    Time spent in minutes : 70 minutes   [name] M.D on 12/22/2018 at 1:49 AM

## 2018-12-22 NOTE — Progress Notes (Signed)
ANTICOAGULATION CONSULT NOTE - Initial Consult  Pharmacy Consult for Continuation of Apixaban Indication: atrial fibrillation  No Known Allergies   Vital Signs: Temp: 97.9 F (36.6 C) (12/07 1756) Temp Source: Oral (12/07 1756) BP: 106/88 (12/08 0000) Pulse Rate: 73 (12/08 0000)  Labs: Recent Labs    12/21/18 1657 12/21/18 1718  HGB 13.7 13.3  HCT 38.6 39.0  PLT 271  --   CREATININE 0.93 0.80    CrCl cannot be calculated (Unknown ideal weight.).   Medical History: Past Medical History:  Diagnosis Date  . Accidental drug overdose   . Acute encephalopathy   . Acute on chronic respiratory failure with hypoxia ([place]) 03/01/2017  . Acute renal failure ([place]) 04/16/2014  . AKI (acute kidney injury) ([place]) 04/30/2014  . Altered mental status 06/27/2012  . Anxiety   . Atrial fibrillation ([place])   . CAD (coronary artery disease)   . CAP (community acquired pneumonia) 04/18/2014  . Cardiomyopathy ([place])   . Chronic hypoxemic respiratory failure ([place]) 06/26/2012  . Chronic pain   . COPD exacerbation ([place]) 06/26/2012  . COPD with acute exacerbation ([place]) 03/01/2017  . DEPRESSIVE DISORDER, NOS 03/13/2006   Qualifier: Diagnosis of  By: Damita Dunnings MD, Phillip Heal    . Edema, peripheral   . Essential hypertension 04/30/2014  . Fall at home, initial encounter 03/01/2017  . GASTROESOPHAGEAL REFLUX, NO ESOPHAGITIS 03/13/2006   Qualifier: Diagnosis of  By: Damita Dunnings MD, Phillip Heal    . HYPERCHOLESTEROLEMIA 03/13/2006   Qualifier: Diagnosis of  By: Damita Dunnings MD, Phillip Heal    . Hyperlipidemia   . HYPERTENSION, BENIGN SYSTEMIC 03/13/2006   Qualifier: Diagnosis of  By: Damita Dunnings MD, Phillip Heal    . Hypokalemia 04/30/2014  . Hyponatremia 04/30/2014  . Hypotension 04/30/2014  . Hypoxemia 06/26/2012  . Hypoxia 03/26/2018  . Irritable bowel syndrome 03/13/2006   Qualifier: Diagnosis of  By: Damita Dunnings MD, Phillip Heal    . Lung nodule 04/16/2014  . MENOPAUSAL SYNDROME 03/13/2006   Qualifier: Diagnosis of  By: Damita Dunnings MD, Phillip Heal    . Mood  disorder [facility]) 03/13/2006   Qualifier: Diagnosis of  By: Damita Dunnings MD, Phillip Heal    . 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 Nausea 04/30/2014  . Nausea vomiting and diarrhea 04/16/2014  . OSTEOARTHRITIS, MULTI SITES 03/13/2006   Qualifier: Diagnosis of  By: Damita Dunnings MD, Phillip Heal    . Pulmonary nodule 04/30/2014  . Respiratory failure ([place]) 01/22/2017  . TOBACCO DEPENDENCE 03/13/2006   Qualifier: Diagnosis of  By: Damita Dunnings MD, Phillip Heal    . Weight loss     Assessment: 73 y/o F here with N/V, to continue PTA apixaban, CBC/renal function ok  Goal of Therapy:  Monitor platelets by anticoagulation protocol: Yes   Plan:  Continue Apixaban 5 mg BID Daily CBC while inpatient Monitor for bleeding  Narda Bonds 12/22/2018,3:46 AM

## 2018-12-22 NOTE — ED Notes (Signed)
Pt to MRI via WC, pt has had multiple episodes of soft stool in bedpans.

## 2018-12-22 NOTE — ED Notes (Signed)
Pt once again has removed all monitoring equipment, pt states she is unable to lift blankets to cover herself.

## 2018-12-22 NOTE — ED Notes (Signed)
Gwendolyn Fill [phone] daughter looking for an update on patient

## 2018-12-22 NOTE — ED Notes (Addendum)
Pt arrives c/o of being cold and agitation, up to [facility] to poop small amts of diarrhea pt  Had poop on her socks , clothing and gown , sheets. Pt constantly calling out and will not keep any leads or bp on, pt cleaned and bed was changed

## 2018-12-22 NOTE — ED Notes (Signed)
Pt being brought back from MRI, pt refusing to attempt MRI at this time.

## 2018-12-22 NOTE — ED Notes (Signed)
The patient asked to call her daughter. [name] was contacted. The pt states "I wants to leave so i can come home and poop because I cant do it here."  After speaking with Francis Dowse the other daughter called and said that they can come and pick her up when a care is available.   This RN explained the reason for admission. The patients heart rate continues to be 133 at rest. She has pulled out her IV's and continue to defecate all around the room. Her potassium is being replaced. The pt feels like there is a hard piece of stool. With [facility]one [facility], [place]) we assisted the patient with glove and lubrication to remove the impaction. There was nothing there. She felt relieved for a few minutes knowing that there is nothing to get out. The patient agrees to stay.   Constantly requesting to get up, states "This is stupid, I need to eat, I can't sit high enough, I need to get home so I can cook. I need a straw." This continues on and on.   Sitter remains at bedside, her meal has been warmed up and given to the patient.

## 2018-12-22 NOTE — ED Notes (Signed)
Social worker is at bedside.   Communication with DO, to enter in an order for Urine Drug Screen as the patients family states that she does not do heroin even though the pt states she does.

## 2018-12-22 NOTE — ED Notes (Signed)
Pt got up, has pulled off all leads and  bp monitor,  without notifying nurse states has to poop, pt incontinent and sat in it on [facility]

## 2018-12-22 NOTE — ED Notes (Signed)
Tele  Allison Vaughan [phone] daughter looking for an update

## 2018-12-22 NOTE — ED Notes (Signed)
ED TO INPATIENT HANDOFF REPORT  ED Nurse Name and Phone #: [phone]  S Name/Age/Gender Allison Vaughan 73 y.o. female Room/Bed: 056C/056C  Code Status   Code Status: Full Code  Home/SNF/Other Home Patient oriented to: self Is this baseline? Yes   Triage Complete: Triage complete  Chief Complaint Gen Weakness; Emesis  Triage Note Pt arrives from home via [facility] with c/c of n/v generalized body aches and chills for 2 days.    Allergies No Known Allergies  Level of Care/Admitting Diagnosis ED Disposition    ED Disposition Condition Comment   Admit  Hospital Area: [facility] [[id number]]  Level of Care: Telemetry Medical [[id number]]  Covid Evaluation: Confirmed COVID Negative  Diagnosis: Acute encephalopathy [id number]  Admitting Physician: Maida Sale  Attending Physician: Geradine Girt [[id number]]  Estimated length of stay: past midnight tomorrow  Certification:: I certify this patient will need inpatient services for at least 2 midnights  PT Class (Do Not Modify): Inpatient [101]  PT Acc Code (Do Not Modify): Private [1]       B Medical/Surgery History Past Medical History:  Diagnosis Date  . Accidental drug overdose   . Acute encephalopathy   . Acute on chronic respiratory failure with hypoxia ([place]) 03/01/2017  . Acute renal failure ([place]) 04/16/2014  . AKI (acute kidney injury) ([place]) 04/30/2014  . Altered mental status 06/27/2012  . Anxiety   . Atrial fibrillation ([place])   . CAD (coronary artery disease)   . CAP (community acquired pneumonia) 04/18/2014  . Cardiomyopathy ([place])   . Chronic hypoxemic respiratory failure ([place]) 06/26/2012  . Chronic pain   . COPD exacerbation ([place]) 06/26/2012  . COPD with acute exacerbation ([place]) 03/01/2017  . DEPRESSIVE DISORDER, NOS 03/13/2006   Qualifier: Diagnosis of  By: Damita Dunnings MD, Phillip Heal    . Edema, peripheral   . Essential hypertension 04/30/2014  . Fall at home, initial encounter 03/01/2017  . GASTROESOPHAGEAL  REFLUX, NO ESOPHAGITIS 03/13/2006   Qualifier: Diagnosis of  By: Damita Dunnings MD, Phillip Heal    . HYPERCHOLESTEROLEMIA 03/13/2006   Qualifier: Diagnosis of  By: Damita Dunnings MD, Phillip Heal    . Hyperlipidemia   . HYPERTENSION, BENIGN SYSTEMIC 03/13/2006   Qualifier: Diagnosis of  By: Damita Dunnings MD, Phillip Heal    . Hypokalemia 04/30/2014  . Hyponatremia 04/30/2014  . Hypotension 04/30/2014  . Hypoxemia 06/26/2012  . Hypoxia 03/26/2018  . Irritable bowel syndrome 03/13/2006   Qualifier: Diagnosis of  By: Damita Dunnings MD, Phillip Heal    . Lung nodule 04/16/2014  . MENOPAUSAL SYNDROME 03/13/2006   Qualifier: Diagnosis of  By: Damita Dunnings MD, Phillip Heal    . Mood disorder [facility]) 03/13/2006   Qualifier: Diagnosis of  By: Damita Dunnings MD, Phillip Heal    . 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 Nausea 04/30/2014  . Nausea vomiting and diarrhea 04/16/2014  . OSTEOARTHRITIS, MULTI SITES 03/13/2006   Qualifier: Diagnosis of  By: Damita Dunnings MD, Phillip Heal    . Pulmonary nodule 04/30/2014  . Respiratory failure ([place]) 01/22/2017  . TOBACCO DEPENDENCE 03/13/2006   Qualifier: Diagnosis of  By: Damita Dunnings MD, Phillip Heal    . Weight loss    Past Surgical History:  Procedure Laterality Date  . EYE SURGERY    . ROTATOR CUFF REPAIR Right 2012     A IV Location/Drains/Wounds Patient Lines/Drains/Airways Status   Active Line/Drains/Airways    Name:   Placement date:   Placement time:   Site:   Days:   Peripheral IV 12/22/18 Right Forearm   12/22/18  1537    Forearm   less than 1          Intake/Output Last 24 hours No intake or output data in the 24 hours ending 12/22/18 1553  Labs/Imaging Results for orders placed or performed during the hospital encounter of 12/21/18 (from the past 48 hour(s))  Comprehensive metabolic panel     Status: Abnormal   Collection Time: 12/21/18  4:57 PM  Result Value Ref Range   Sodium 131 (L) 135 - 145 mmol/L   Potassium 2.8 (L) 3.5 - 5.1 mmol/L   Chloride 88 (L) 98 - 111 mmol/L   CO2 31 22 - 32 mmol/L   Glucose, Bld 122 (H) 70 - 99 mg/dL    BUN 9 8 - 23 mg/dL   Creatinine, Ser 0.93 0.44 - 1.00 mg/dL   Calcium 9.2 8.9 - 10.3 mg/dL   Total Protein 6.5 6.5 - 8.1 g/dL   Albumin 4.0 3.5 - 5.0 g/dL   AST 16 15 - 41 U/L   ALT 9 0 - 44 U/L   Alkaline Phosphatase 108 38 - 126 U/L   Total Bilirubin 0.6 0.3 - 1.2 mg/dL   GFR calc non Af Amer >60 >60 mL/min   GFR calc Af Amer >60 >60 mL/min   Anion gap 12 5 - 15    Comment: Performed at [facility], [address] [place], [place] [address]  CBC with Differential     Status: None   Collection Time: 12/21/18  4:57 PM  Result Value Ref Range   WBC 4.2 4.0 - 10.5 K/uL   RBC 4.70 3.87 - 5.11 MIL/uL   Hemoglobin 13.7 12.0 - 15.0 g/dL   HCT 38.6 36.0 - 46.0 %   MCV 82.1 80.0 - 100.0 fL   MCH 29.1 26.0 - 34.0 pg   MCHC 35.5 30.0 - 36.0 g/dL   RDW 12.1 11.5 - 15.5 %   Platelets 271 150 - 400 K/uL   nRBC 0.0 0.0 - 0.2 %   Neutrophils Relative % 63 %   Neutro Abs 2.7 1.7 - 7.7 K/uL   Lymphocytes Relative 20 %   Lymphs Abs 0.8 0.7 - 4.0 K/uL   Monocytes Relative 15 %   Monocytes Absolute 0.6 0.1 - 1.0 K/uL   Eosinophils Relative 0 %   Eosinophils Absolute 0.0 0.0 - 0.5 K/uL   Basophils Relative 1 %   Basophils Absolute 0.0 0.0 - 0.1 K/uL   Immature Granulocytes 1 %   Abs Immature Granulocytes 0.03 0.00 - 0.07 K/uL    Comment: Performed at [facility], [address] [place], [place] [address]  Magnesium     Status: None   Collection Time: 12/21/18  4:57 PM  Result Value Ref Range   Magnesium 1.7 1.7 - 2.4 mg/dL    Comment: Performed at [facility], [place] [address], [place], [place] [address]  Lipase, blood     Status: None   Collection Time: 12/21/18  4:57 PM  Result Value Ref Range   Lipase 17 11 - 51 U/L    Comment: Performed at [facility] [address] [place], [place] [address]  I-stat chem 8, ED (not at [facility] or [facility])     Status: Abnormal   Collection Time: 12/21/18  5:18 PM  Result Value Ref Range   Sodium 130 (L) 135 - 145 mmol/L    Potassium 2.8 (L) 3.5 - 5.1 mmol/L   Chloride 88 (  L) 98 - 111 mmol/L   BUN 8 8 - 23 mg/dL   Creatinine, Ser 0.80 0.44 - 1.00 mg/dL   Glucose, Bld 118 (H) 70 - 99 mg/dL   Calcium, Ion 0.88 (LL) 1.15 - 1.40 mmol/L   TCO2 33 (H) 22 - 32 mmol/L   Hemoglobin 13.3 12.0 - 15.0 g/dL   HCT 39.0 36.0 - 46.0 %   Comment NOTIFIED PHYSICIAN   Urinalysis, Routine w reflex microscopic     Status: None   Collection Time: 12/21/18  6:54 PM  Result Value Ref Range   Color, Urine YELLOW YELLOW   APPearance CLEAR CLEAR   Specific Gravity, Urine 1.006 1.005 - 1.030   pH 8.0 5.0 - 8.0   Glucose, UA NEGATIVE NEGATIVE mg/dL   Hgb urine dipstick NEGATIVE NEGATIVE   Bilirubin Urine NEGATIVE NEGATIVE   Ketones, ur NEGATIVE NEGATIVE mg/dL   Protein, ur NEGATIVE NEGATIVE mg/dL   Nitrite NEGATIVE NEGATIVE   Leukocytes,Ua NEGATIVE NEGATIVE    Comment: Performed at [facility], [place] [address], [place], [place] [address]  Urine culture     Status: Abnormal   Collection Time: 12/21/18  6:54 PM   Specimen: Urine, Random  Result Value Ref Range   Specimen Description URINE, RANDOM    Special Requests NONE    Culture (A)     <10,000 COLONIES/mL INSIGNIFICANT GROWTH Performed at [facility], [place] [address], [place], [place] [address]    Report Status 12/22/2018 FINAL   SARS CORONAVIRUS 2 (TAT 6-24 HRS) Nasopharyngeal Urine, Clean Catch     Status: None   Collection Time: 12/21/18  6:54 PM   Specimen: Urine, Clean Catch; Nasopharyngeal  Result Value Ref Range   SARS Coronavirus 2 NEGATIVE NEGATIVE    Comment: (NOTE) SARS-CoV-2 target nucleic acids are NOT DETECTED. The SARS-CoV-2 RNA is generally detectable in upper and lower respiratory specimens during the acute phase of infection. Negative results do not preclude SARS-CoV-2 infection, do not rule out co-infections with other pathogens, and should not be used as the sole basis for treatment or other patient management decisions. Negative  results must be combined with clinical observations, patient history, and epidemiological information. The expected result is Negative. Fact Sheet for Patients: SugarRoll.be Fact Sheet for Healthcare Providers: https://www.woods-mathews.com/ This test is not yet approved or cleared by the [place] FDA and  has been authorized for detection and/or diagnosis of SARS-CoV-2 by FDA under an Emergency Use Authorization (EUA). This EUA will remain  in effect (meaning this test can be used) for the duration of the COVID-19 declaration under Section 56 4(b)(1) of the Act, 21 U.S.C. section 360bbb-3(b)(1), unless the authorization is terminated or revoked sooner. Performed at [facility], [place] [address], [place], [place] [address]   Comprehensive metabolic panel     Status: Abnormal   Collection Time: 12/22/18  5:50 AM  Result Value Ref Range   Sodium 137 135 - 145 mmol/L    Comment: DELTA CHECK NOTED   Potassium 2.8 (L) 3.5 - 5.1 mmol/L   Chloride 89 (L) 98 - 111 mmol/L   CO2 28 22 - 32 mmol/L   Glucose, Bld 135 (H) 70 - 99 mg/dL   BUN 7 (L) 8 - 23 mg/dL   Creatinine, Ser 0.93 0.44 - 1.00 mg/dL   Calcium 9.0 8.9 - 10.3 mg/dL   Total Protein 6.9 6.5 - 8.1 g/dL   Albumin 4.0 3.5 - 5.0 g/dL   AST 19  15 - 41 U/L   ALT 9 0 - 44 U/L   Alkaline Phosphatase 115 38 - 126 U/L   Total Bilirubin 0.5 0.3 - 1.2 mg/dL   GFR calc non Af Amer >60 >60 mL/min   GFR calc Af Amer >60 >60 mL/min   Anion gap 20 (H) 5 - 15    Comment: Performed at [facility] [address], [place] [address]  CBC     Status: None   Collection Time: 12/22/18  5:50 AM  Result Value Ref Range   WBC 10.0 4.0 - 10.5 K/uL   RBC 5.04 3.87 - 5.11 MIL/uL   Hemoglobin 15.0 12.0 - 15.0 g/dL   HCT 41.7 36.0 - 46.0 %   MCV 82.7 80.0 - 100.0 fL   MCH 29.8 26.0 - 34.0 pg   MCHC 36.0 30.0 - 36.0 g/dL   RDW 12.4 11.5 - 15.5 %   Platelets 343 150 - 400 K/uL    nRBC 0.0 0.0 - 0.2 %    Comment: Performed at [facility], [place] [address], [place], [place] [address]   Ct Abdomen Pelvis W Contrast  Result Date: 12/22/2018 CLINICAL DATA:  Lower abdominal pain in vomiting x1 week. EXAM: CT ABDOMEN AND PELVIS WITH CONTRAST TECHNIQUE: Multidetector CT imaging of the abdomen and pelvis was performed using the standard protocol following bolus administration of intravenous contrast. CONTRAST:  127mL OMNIPAQUE IOHEXOL 300 MG/ML  SOLN COMPARISON:  CT dated 08/20/2018 FINDINGS: Lower chest: The lung bases are clear. The heart size is normal. Hepatobiliary: The liver is normal. Normal gallbladder.There is no biliary ductal dilation. Pancreas: Again noted is dilatation of the distal pancreatic duct within abrupt cutoff at the level of the pancreatic body. Spleen: The spleen is again enlarged measuring approximately 12 cm craniocaudad. Adrenals/Urinary Tract: --Adrenal glands: No adrenal hemorrhage. --Right kidney/ureter: No hydronephrosis or perinephric hematoma. --Left kidney/ureter: There are nonobstructing stones in the upper pole the left kidney. --Urinary bladder: Unremarkable. Stomach/Bowel: --Stomach/Duodenum: No hiatal hernia or other gastric abnormality. Normal duodenal course and caliber. --Small bowel: No dilatation or inflammation. --Colon: There is a large stool burden. There is a large amount of stool at the level of the rectum. --Appendix: Not visualized. No right lower quadrant inflammation or free fluid. Vascular/Lymphatic: Atherosclerotic calcification is present within the non-aneurysmal abdominal aorta, without hemodynamically significant stenosis. --No retroperitoneal lymphadenopathy. --No mesenteric lymphadenopathy. --No pelvic or inguinal lymphadenopathy. Reproductive: Status post hysterectomy. No adnexal mass. Other: No ascites or free air. The abdominal wall is normal. Musculoskeletal. There are advanced degenerative changes throughout the visualized  lumbar spine. There is a grade 1 anterolisthesis of L5 on S1 secondary to degenerative changes. There are subacute anterior rib fractures on the right. There are old healed bilateral rib fractures IMPRESSION: 1. Large amount of stool at the level of the rectum. Correlate for symptoms of fecal impaction. 2. Persistent dilatation of the distal pancreatic duct with abrupt cutoff at the level of the pancreatic body. Further evaluation with a nonemergent outpatient contrast enhanced MRI is recommended. 3. Nonobstructive left nephrolithiasis. 4. Subacute anterior right rib fractures. Aortic Atherosclerosis (ICD10-I70.0). Electronically Signed   By: Constance Holster M.D.   On: 12/22/2018 00:03   Dg Chest Port 1 View  Result Date: 12/21/2018 CLINICAL DATA:  Nausea vomiting for 1 week EXAM: PORTABLE CHEST 1 VIEW COMPARISON:  08/20/2018 FINDINGS: Cardiac shadow is stable. Aortic calcifications are again seen and stable. The lungs are well aerated bilaterally. No focal  infiltrate or sizable effusion is seen. No acute bony abnormality is noted. IMPRESSION: No active disease. Electronically Signed   By: Inez Catalina M.D.   On: 12/21/2018 18:44    Pending Labs Unresulted Labs (From admission, onward)    Start     Ordered   12/23/18 0500  Comprehensive metabolic panel  Tomorrow morning,   R     12/22/18 1354   12/22/18 1413  Urine rapid drug screen (hosp performed)  ONCE - STAT,   STAT     12/22/18 1412          Vitals/Pain Today's Vitals   12/22/18 1333 12/22/18 1345 12/22/18 1430 12/22/18 1518  BP: (!) 151/106 121/87 114/70   Pulse:  (!) 125 84   Resp:  (!) 23 (!) 26   Temp:      TempSrc:      SpO2:  93% 96%   PainSc:    7     Isolation Precautions No active isolations  Medications Medications  potassium chloride 10 mEq in 100 mL IVPB (10 mEq Intravenous Not Given 12/22/18 0041)  0.9 % NaCl with KCl 20 mEq/ L  infusion ( Intravenous Transfusing/Transfer 12/22/18 1553)  acetaminophen  (TYLENOL) tablet 650 mg (has no administration in time range)    Or  acetaminophen (TYLENOL) suppository 650 mg (has no administration in time range)  polyethylene glycol (MIRALAX / GLYCOLAX) packet 17 g (17 g Oral Given 12/22/18 1038)  prochlorperazine (COMPAZINE) injection 10 mg (has no administration in time range)  oxyCODONE (Oxy IR/ROXICODONE) immediate release tablet 10 mg (10 mg Oral Given 12/22/18 1518)  atorvastatin (LIPITOR) tablet 10 mg (has no administration in time range)  diltiazem (CARDIZEM CD) 24 hr capsule 300 mg (300 mg Oral Given 12/22/18 1333)  furosemide (LASIX) tablet 20 mg (20 mg Oral Given 12/22/18 1036)  LORazepam (ATIVAN) tablet 0.5 mg (0.5 mg Oral Given 12/22/18 0755)  senna (SENOKOT) tablet 8.6 mg (has no administration in time range)  clopidogrel (PLAVIX) tablet 75 mg (75 mg Oral Given 12/22/18 1036)  feeding supplement (ENSURE ENLIVE) (ENSURE ENLIVE) liquid 237 mL (237 mLs Oral Given 12/22/18 1521)  albuterol (VENTOLIN HFA) 108 (90 Base) MCG/ACT inhaler 2 puff (has no administration in time range)  fluticasone (FLONASE) 50 MCG/ACT nasal spray 2 spray (2 sprays Each Nare Given 12/22/18 1520)  mometasone-formoterol (DULERA) 200-5 MCG/ACT inhaler 2 puff (2 puffs Inhalation Not Given 12/22/18 0943)  umeclidinium bromide (INCRUSE ELLIPTA) 62.5 MCG/INH 1 puff (1 puff Inhalation Given 12/22/18 1519)  pantoprazole (PROTONIX) EC tablet 40 mg (40 mg Oral Given 12/22/18 1036)  apixaban (ELIQUIS) tablet 5 mg (5 mg Oral Given 12/22/18 1036)  dicyclomine (BENTYL) tablet 20 mg (20 mg Oral Given 12/22/18 1335)  hydrOXYzine (ATARAX/VISTARIL) tablet 25 mg (has no administration in time range)  loperamide (IMODIUM) capsule 2-4 mg (4 mg Oral Given 12/22/18 1334)  methocarbamol (ROBAXIN) tablet 500 mg (has no administration in time range)  ondansetron (ZOFRAN-ODT) disintegrating tablet 4 mg (has no administration in time range)  cloNIDine (CATAPRES) tablet 0.1 mg (0.1 mg Oral Given 12/22/18 1336)     Followed by  cloNIDine (CATAPRES) tablet 0.1 mg (has no administration in time range)    Followed by  cloNIDine (CATAPRES) tablet 0.1 mg (has no administration in time range)  magnesium sulfate IVPB 2 g 50 mL (0 g Intravenous Stopped 12/21/18 2109)  sodium chloride 0.9 % bolus 1,000 mL (0 mLs Intravenous Stopped 12/21/18 2237)  fentaNYL (SUBLIMAZE) injection 50 mcg (50 mcg Intravenous  Given 12/21/18 2214)  LORazepam (ATIVAN) injection 0.5 mg (0.5 mg Intravenous Given 12/21/18 2218)  iohexol (OMNIPAQUE) 300 MG/ML solution 100 mL (100 mLs Intravenous Contrast Given 12/21/18 2334)  sodium phosphate (FLEET) 7-19 GM/118ML enema 1 enema (1 enema Rectal Given 12/22/18 0137)  mineral oil enema 1 enema (1 enema Rectal Given 12/22/18 0221)  potassium chloride SA (KLOR-CON) CR tablet 40 mEq (40 mEq Oral Given 12/22/18 1521)    Mobility walks Low fall risk   Focused Assessments Cardiac Assessment Handoff:    Lab Results  Component Value Date   CKTOTAL 75 07/09/2018   CKMB 4.2 07/09/2018   TROPONINI <0.03 03/26/2018   Lab Results  Component Value Date   DDIMER 1.33 (H) 07/08/2018   Does the Patient currently have chest pain? No     R Recommendations: See Admitting Provider Note  Report given to:   Additional Notes:

## 2018-12-22 NOTE — ED Notes (Signed)
Diet was ordered for Lunch. 

## 2018-12-22 NOTE — ED Notes (Signed)
Tele   Breakfast ordered  

## 2018-12-22 NOTE — ED Notes (Signed)
Pt agitated thrashing around in bed , c/o being cold but when given blankets states they are to heavy,  And throws them off ,constant movement ,cont asking what time it is,

## 2018-12-22 NOTE — ED Notes (Signed)
Pt up again, had pulled self off monitor agagin assisted to [facility] where she had diarrhea again small amt

## 2018-12-22 NOTE — ED Notes (Signed)
Pt   called out  Again standing on side of bed  States she wants IV out , had pulled out the IV states it hurts , area was red and swollen where pt had been pulling at it

## 2018-12-22 NOTE — ED Notes (Addendum)
Per pts daughter [name] , her mom does not do heroin , daughter states that she is confused, states it is oxycontin, I had asked pt if her daughter knew she was doing heroin and she states , pt states a friend has been getting it for her and she has only been doing it a month

## 2018-12-22 NOTE — TOC Initial Note (Signed)
Transition of Care [facility]) - Initial/Assessment Note    Patient Details  Name: Allison Vaughan MRN: [id number] Date of Birth: [birth year]  Transition of Care [facility]) CM/SW Contact:    Vergie Living, LCSW Phone Number: 12/22/2018, 2:37 PM  Clinical Narrative:    CSW met with PT in room for assessment. 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 PT states that heroin was provided "by a friend".  PT also reported that she has participated in Narcotics Anonymous in the past, and that she is willing to go again, but that she has no transportation. PT reports that she has been to "all of those clinics" indicating that she has been to several methadone clinics in [place] and could name the streets where they are located.  PT requested that ED provide her with methadone to "stop the sickness".  In reviewing the files CSW discovered that that in 01/2017, PT file shows that family was taking PT to methadone clinic for Xanax withdrawal.              Expected Discharge Plan: Home/Self Care Barriers to Discharge: Active Substance Use - Placement, Continued Medical Work up   Patient Goals and CMS Choice Patient states their goals for this hospitalization and ongoing recovery are:: To return home      Expected Discharge Plan and Services Expected Discharge Plan: Home/Self Care       Living arrangements for the past 2 months: Single Family Home                                      Prior Living Arrangements/Services Living arrangements for the past 2 months: Single Family Home Lives with:: Adult Children   Do you feel safe going back to the place where you live?: Yes      Need for Family Participation in Patient Care: Yes (Comment) Care giver support system in place?: Yes (comment)   Criminal Activity/Legal Involvement Pertinent to Current Situation/Hospitalization: Yes -  Comment as needed(PT is obtaining heroin "from friend")  Activities of Daily Living      Permission Sought/Granted                  Emotional Assessment Appearance:: Appears stated age, Developmentally appropriate Attitude/Demeanor/Rapport: Complaining, Lethargic Affect (typically observed): Agitated, Overwhelmed Orientation: : Oriented to Self, Oriented to Place, Oriented to  Time, Oriented to Situation Alcohol / Substance Use: Illicit Drugs, Tobacco Use Psych Involvement: No (comment)  Admission diagnosis:  Gen Weakness; Emesis Patient Active Problem List   Diagnosis Date Noted  . Prolonged QT interval 12/22/2018  . Hypomagnesemia 12/22/2018  . Constipation 12/22/2018  . Dilation of pancreatic duct 12/22/2018  . Altered mental status 07/09/2018  . Acute lower UTI 07/09/2018  . Anemia 07/09/2018  . Bronchiolitis 07/09/2018  . Bronchitis 07/09/2018  . Acute metabolic encephalopathy 50/09/3816  . Contusion of right wrist   . Fall   . Bacteria in urine   . Mild renal insufficiency 03/26/2018  . 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 Atrial fibrillation with RVR ([place]) 03/01/2017  . Acute on chronic respiratory failure with hypoxia ([place]) 03/01/2017  . Fall at home, initial encounter 03/01/2017  . Accidental drug overdose   . Respiratory failure ([place]) 01/22/2017  . Acute encephalopathy   . AKI (acute kidney injury) ([place]) 04/30/2014  .  Pulmonary nodule 04/30/2014  . Essential hypertension 04/30/2014  . Hypokalemia 04/30/2014  . Hyponatremia 04/30/2014  . Hypotension 04/30/2014  . Nausea & vomiting 04/30/2014  . Lung nodule 04/16/2014  . Chronic hypoxemic respiratory failure ([place]) 06/26/2012  . COPD (chronic obstructive pulmonary disease) ([place]) 06/26/2012  . HYPERCHOLESTEROLEMIA 03/13/2006  . TOBACCO DEPENDENCE 03/13/2006  . Mood disorder ([place]) 03/13/2006  . HYPERTENSION, BENIGN SYSTEMIC 03/13/2006  . GASTROESOPHAGEAL REFLUX, NO ESOPHAGITIS 03/13/2006  .  IRRITABLE BOWEL SYNDROME 03/13/2006  . MENOPAUSAL SYNDROME 03/13/2006  . OSTEOARTHRITIS, MULTI SITES 03/13/2006   PCP:  [name], MD Pharmacy:   [facility] - [place], [place] [address] Phone: [phone] Fax: [phone]  [facility] [place], [place] - [place] [address] AT [address] [place] [address] Phone: [phone] Fax: [phone]     Social Determinants of Health (SDOH) Interventions    Readmission Risk Interventions Readmission Risk Prevention Plan 03/28/2018  Transportation Screening Complete  Home Care Screening Complete  Medication Review (RN CM) Complete  Some recent data might be hidden

## 2018-12-23 ENCOUNTER — Other Ambulatory Visit: Payer: Self-pay | Admitting: Medical

## 2018-12-23 DIAGNOSIS — I480 Paroxysmal atrial fibrillation: Secondary | ICD-10-CM

## 2018-12-23 DIAGNOSIS — R9431 Abnormal electrocardiogram [ECG] [EKG]: Secondary | ICD-10-CM

## 2018-12-23 DIAGNOSIS — K8689 Other specified diseases of pancreas: Secondary | ICD-10-CM

## 2018-12-23 LAB — BASIC METABOLIC PANEL
Anion gap: 13 (ref 5–15)
BUN: 14 mg/dL (ref 8–23)
CO2: 25 mmol/L (ref 22–32)
Calcium: 8.2 mg/dL — ABNORMAL LOW (ref 8.9–10.3)
Chloride: 88 mmol/L — ABNORMAL LOW (ref 98–111)
Creatinine, Ser: 1.2 mg/dL — ABNORMAL HIGH (ref 0.44–1.00)
GFR calc Af Amer: 51 mL/min — ABNORMAL LOW (ref >60)
GFR calc non Af Amer: 44 mL/min — ABNORMAL LOW (ref >60)
Glucose, Bld: 100 mg/dL — ABNORMAL HIGH (ref 70–99)
Potassium: 2.9 mmol/L — ABNORMAL LOW (ref 3.5–5.1)
Sodium: 126 mmol/L — ABNORMAL LOW (ref 135–145)

## 2018-12-23 LAB — COMPREHENSIVE METABOLIC PANEL
ALT: 8 U/L (ref 0–44)
AST: 15 U/L (ref 15–41)
Albumin: 3.2 g/dL — ABNORMAL LOW (ref 3.5–5.0)
Alkaline Phosphatase: 83 U/L (ref 38–126)
Anion gap: 14 (ref 5–15)
BUN: 15 mg/dL (ref 8–23)
CO2: 25 mmol/L (ref 22–32)
Calcium: 8 mg/dL — ABNORMAL LOW (ref 8.9–10.3)
Chloride: 90 mmol/L — ABNORMAL LOW (ref 98–111)
Creatinine, Ser: 1.04 mg/dL — ABNORMAL HIGH (ref 0.44–1.00)
GFR calc Af Amer: >60 mL/min (ref >60)
GFR calc non Af Amer: 53 mL/min — ABNORMAL LOW (ref >60)
Glucose, Bld: 98 mg/dL (ref 70–99)
Potassium: 2.9 mmol/L — ABNORMAL LOW (ref 3.5–5.1)
Sodium: 129 mmol/L — ABNORMAL LOW (ref 135–145)
Total Bilirubin: 0.1 mg/dL — ABNORMAL LOW (ref 0.3–1.2)
Total Protein: 5.1 g/dL — ABNORMAL LOW (ref 6.5–8.1)

## 2018-12-23 LAB — MAGNESIUM: Magnesium: 1.7 mg/dL (ref 1.7–2.4)

## 2018-12-23 MED ORDER — POTASSIUM CHLORIDE CRYS ER 20 MEQ PO TBCR
40.0000 meq | EXTENDED_RELEASE_TABLET | ORAL | Status: AC
  Administered 2018-12-23 (×2): 40 meq via ORAL
  Filled 2018-12-23 (×2): qty 2

## 2018-12-23 MED ORDER — APIXABAN 5 MG PO TABS
5.0000 mg | ORAL_TABLET | Freq: Two times a day (BID) | ORAL | Status: DC
  Administered 2018-12-23 – 2018-12-25 (×3): 5 mg via ORAL
  Filled 2018-12-23 (×3): qty 1

## 2018-12-23 MED ORDER — METOPROLOL TARTRATE 12.5 MG HALF TABLET
12.5000 mg | ORAL_TABLET | Freq: Two times a day (BID) | ORAL | Status: DC
  Administered 2018-12-24 – 2018-12-26 (×4): 12.5 mg via ORAL
  Filled 2018-12-23 (×4): qty 1

## 2018-12-23 MED ORDER — POTASSIUM CHLORIDE IN NACL 20-0.9 MEQ/L-% IV SOLN
INTRAVENOUS | Status: AC
  Administered 2018-12-23 – 2018-12-24 (×2): via INTRAVENOUS
  Filled 2018-12-23 (×2): qty 1000

## 2018-12-23 NOTE — Progress Notes (Addendum)
PROGRESS NOTE  Allison Vaughan [id number] DOB: [birth year] DOA: 12/21/2018 PCP: [name], MD  Brief history:  Per ED intake on 12/8 at 7:30 am: Pt arrives c/o of being cold and agitation, up to [facility] to poop small amts of diarrhea pt  Had poop on her socks , clothing and gown , sheets. Pt constantly calling out and will not keep any leads or bp on, pt cleaned and bed was changed  Per admitting MD Dr Maudie Mercury at 12/8 1:49am:  Allison Vaughan  is a 73 y.o. 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 PT states that heroin was provided "by a friend".  PT also reported that she has participated in Narcotics Anonymous in the past, and that she is willing to go again, but that she has no transportation."  Family denies patient 's history of heroin use, daughter who is the main caregiver state patient is confused.  Time patient used nasal heroine was a year ago.   HPI/Recap of past 24 hours:  She is currently alert and oriented, she report no more nausea since in the hospital She started have diarrhea after the enema  Assessment/Plan: Active Problems:   Essential hypertension   Hypokalemia   Hyponatremia   Nausea & vomiting   Acute encephalopathy   Prolonged QT interval   Hypomagnesemia   Constipation   Dilation of pancreatic duct  Nausea vomiting -Possibly due to fecal impaction -CT scan on presentation "1. Large amount of stool at the level of the rectum. Correlate for symptoms of fecal impaction. 2. Persistent dilatation of the distal pancreatic duct with abrupt cutoff at the level of the pancreatic body. Further evaluation with a nonemergent outpatient contrast  enhanced MRI is recommended. 3. Nonobstructive left nephrolithiasis. 4. Subacute anterior right rib fractures.  Aortic Atherosclerosis (ICD10-I70.0)." -No vomiting since in the hospital, appears to tolerate regular diet   Persistent dilatation of the distal pancreatic duct with abrupt cutoff at the level of the pancreatic body. Further evaluation with a nonemergent outpatient contrast enhanced MRI is recommended. -This was noted in last discharge summary in June 2020, she was not able to tolerate MRI in the hospital, he is instructed to have outpatient follow-up -Per chart review there are several no-shows with GI, cardiology and PCP in the past -We will proceed with MRI /MRCP here  Hyponatremia sodium 129, hypokalemia potassium 2.9 He appears dehydrated clinically ,discontinue Lasix, start gentle hydration with normal saline/54meqk at 75 cc/hr, replace potassium Repeat BMP in the morning, check mag and phos  QTC prolongation -Multifactorial including electrolyte abnormality  -Continue telemetry, place electrolyte  Acute metabolic encephalopathy -Possibly related to withdrawal in the setting of fecal impaction and metabolic derangement, per daughter patient does not have any substance abuse, report patient got confused whenever she has electrolyte abnormality -She was agitated and irritable yesterday required safety sitter sitter, per chart review she was hospitalized in January 2019 due to encephalopathy and was intubated for airway protection, encephalopathy at that time was attributed to withdraw ( Presumed benzodiazepine and opiate withdrawal.),  Per daughter thought that episode was from withdrawing.  Patient was in the process of taking of methadone at the time. -  Today she is doing better currently alert and oriented x3 uncooperative  Possible gastritis Stomach duodenum no acute findings on CT abdomen pelvis on presentation She is on PPI  Chronic pain was on methadone in the  past, she was taken off methadone 89-month ago substance abuse?, patient reports recent nasal heroin use, per daughter patient is confused, and did not have any recent heroin use at all Social worker consulted, input appreciated  History of paroxysmal A. Fib -EKG documented afib in 03/2018 -He is on Cardizem and Eliquis , per daughter she is taken off eliquis for about 6 months due to unclear evidence of afib, daughter is willing to restart Eliquis -Daughter also reports Cardizem caused edema, " every time she is off Cardizem her edema resolves" per daughter's report.  She is started on Lasix for edema after starting Cardizem in the past -She is sinus rhythm with intermittent sinus tachycardia on telemetry -We will discontinue Cardizem change to beta-blocker -Daughter agrees to follow-up with cardiology  CAD  Cont Plavix 75mg  po qday Cont Lipitor 10mg  po qhs  FTT; patient appear very weak, get physical therapy  Several no-shows in the past per chart review, daughter reports time is difficult to get patient to agreed to go to doctor's appointment, daughter state she is going to try to convince.patient to follow-up with her doctors   DVT Prophylaxis: On Eliquis  Code Status: Full  Family Communication: patient , daughter who is the main caregiver [name] over the phone [phone]   Disposition Plan: Not ready to discharge   Consultants:  None  Procedures:  None  Antibiotics:  None   Objective: BP 119/61 (BP Location: Left Arm)   Pulse 69   Temp 99 F (37.2 C) (Oral)   Resp 18   SpO2 98%   Intake/Output Summary (Last 24 hours) at 12/23/2018 1304 Last data filed at 12/23/2018 0853 Gross per 24 hour  Intake 480 ml  Output -  Net 480 ml   There were no vitals filed for this visit.  Exam: Patient is examined daily including today on 12/23/2018, exams remain the same as of yesterday except that has changed    General:  NAD, frail, chronically ill-appearing   Cardiovascular: RRR  Respiratory: CTABL  Abdomen: Soft/ND/NT, positive BS  Musculoskeletal: No Edema  Neuro: alert, oriented x3  Data Reviewed: Basic Metabolic Panel: Recent Labs  Lab 12/21/18 1657 12/21/18 1718 12/22/18 0550 12/23/18 0401  NA 131* 130* 137 129*  K 2.8* 2.8* 2.8* 2.9*  CL 88* 88* 89* 90*  CO2 31  --  28 25  GLUCOSE 122* 118* 135* 98  BUN 9 8 7* 15  CREATININE 0.93 0.80 0.93 1.04*  CALCIUM 9.2  --  9.0 8.0*  MG 1.7  --   --   --    Liver Function Tests: Recent Labs  Lab 12/21/18 1657 12/22/18 0550 12/23/18 0401  AST 16 19 15   ALT 9 9 8   ALKPHOS 108 115 83  BILITOT 0.6 0.5 0.1*  PROT 6.5 6.9 5.1*  ALBUMIN 4.0 4.0 3.2*   Recent Labs  Lab 12/21/18 1657  LIPASE 17   No results for input(s): AMMONIA in the last 168 hours. CBC: Recent Labs  Lab 12/21/18 1657 12/21/18 1718 12/22/18 0550  WBC 4.2  --  10.0  NEUTROABS 2.7  --   --   HGB 13.7 13.3 15.0  HCT 38.6 39.0 41.7  MCV 82.1  --  82.7  PLT 271  --  343  Cardiac Enzymes:   No results for input(s): CKTOTAL, CKMB, CKMBINDEX, TROPONINI in the last 168 hours. BNP (last 3 results) Recent Labs    03/25/18 2358 07/08/18 2345 08/20/18 0951  BNP 1,025.1* 147.3* 39.9    ProBNP (last 3 results) No results for input(s): PROBNP in the last 8760 hours.  CBG: No results for input(s): GLUCAP in the last 168 hours.  Recent Results (from the past 240 hour(s))  Urine culture     Status: Abnormal   Collection Time: 12/21/18  6:54 PM   Specimen: Urine, Random  Result Value Ref Range Status   Specimen Description URINE, RANDOM  Final   Special Requests NONE  Final   Culture (A)  Final    <10,000 COLONIES/mL INSIGNIFICANT GROWTH Performed at [facility], [address] [place], [place] [address]    Report Status 12/22/2018 FINAL  Final  SARS CORONAVIRUS 2 (TAT 6-24 HRS) Nasopharyngeal Urine, Clean Catch     Status: None   Collection Time: 12/21/18  6:54 PM   Specimen: Urine,  Clean Catch; Nasopharyngeal  Result Value Ref Range Status   SARS Coronavirus 2 NEGATIVE NEGATIVE Final    Comment: (NOTE) SARS-CoV-2 target nucleic acids are NOT DETECTED. The SARS-CoV-2 RNA is generally detectable in upper and lower respiratory specimens during the acute phase of infection. Negative results do not preclude SARS-CoV-2 infection, do not rule out co-infections with other pathogens, and should not be used as the sole basis for treatment or other patient management decisions. Negative results must be combined with clinical observations, patient history, and epidemiological information. The expected result is Negative. Fact Sheet for Patients: SugarRoll.be Fact Sheet for Healthcare Providers: https://www.woods-mathews.com/ This test is not yet approved or cleared by the [place] FDA and  has been authorized for detection and/or diagnosis of SARS-CoV-2 by FDA under an Emergency Use Authorization (EUA). This EUA will remain  in effect (meaning this test can be used) for the duration of the COVID-19 declaration under Section 56 4(b)(1) of the Act, 21 U.S.C. section 360bbb-3(b)(1), unless the authorization is terminated or revoked sooner. Performed at [facility], [place] [address] [place], [place] [address]      Studies: No results found.  Scheduled Meds: . apixaban  5 mg Oral BID  . atorvastatin  10 mg Oral QHS  . cloNIDine  0.1 mg Oral QID   Followed by  . [START ON 12/24/2018] cloNIDine  0.1 mg Oral BH-qamhs   Followed by  . [START ON 12/26/2018] cloNIDine  0.1 mg Oral QAC breakfast  . clopidogrel  75 mg Oral Daily  . diltiazem  300 mg Oral Daily  . feeding supplement (ENSURE ENLIVE)  237 mL Oral Q24H  . fluticasone  2 spray Each Nare Daily  . LORazepam  0.5 mg Oral Daily  . mometasone-formoterol  2 puff Inhalation BID  . oxyCODONE  10 mg Oral BID  . pantoprazole  40 mg Oral Daily  . potassium chloride  40  mEq Oral Q4H  . senna  1 tablet Oral QHS  . umeclidinium bromide  1 puff Inhalation Daily    Continuous Infusions:   Time spent: 93mins I have personally reviewed and interpreted on  12/23/2018 daily labs, tele strips, imagings as discussed above under date review session and assessment and plans.  I reviewed all nursing notes, pharmacy notes, vitals, pertinent old records  I have discussed plan of care as described above with RN , patient and daughter over the phone  on 12/23/2018   Florencia Reasons MD, PhD, FACP  [facility] Pager [phone]. If 7PM-7AM, please contact night-coverage at [email or web address], password [facility] 12/23/2018, 1:04 PM  LOS: 1 day

## 2018-12-23 NOTE — Evaluation (Signed)
Physical Therapy Evaluation Patient Details Name: Allison Vaughan MRN: [id number] DOB: [birth year] Today's Date: 12/23/2018   History of Present Illness  Pt is a 73 y.o. F who presented with nausea/ vomiting for 3 days. Work up reveals likely gastritis, hyponatremia, hypokalemia, fecal impaction, tachycardia acute encephalopathy.   Clinical Impression  Pt admitted with above. Appears to be close to functional baseline; ambulating limited hallway distances with no assistive device at a supervision level. HR 71-76 bpm. Displays decreased endurance and pain. Reports chronic back/abdominal pain in addition to anxiety. No further acute PT or follow up warranted. PT signing off.     Follow Up Recommendations No PT follow up    Equipment Recommendations  None recommended by PT    Recommendations for Other Services       Precautions / Restrictions Precautions Precautions: Fall Precaution Comments: pt reports history of falls Restrictions Weight Bearing Restrictions: No      Mobility  Bed Mobility Overal bed mobility: Independent                Transfers Overall transfer level: Independent Equipment used: None                Ambulation/Gait Ambulation/Gait assistance: Supervision Gait Distance (Feet): 75 Feet Assistive device: None Gait Pattern/deviations: Step-through pattern;Decreased stride length;Trunk flexed Gait velocity: decreased   General Gait Details: No evidence of gross instability or overt LOB  Stairs            Wheelchair Mobility    Modified Rankin (Stroke Patients Only)       Balance Overall balance assessment: Mild deficits observed, not formally tested                                           Pertinent Vitals/Pain      Home Living Family/patient expects to be discharged to:: Private residence Living Arrangements: Children(daughter, friend) Available Help at Discharge: Family;Available 24 hours/day Type  of Home: House Home Access: Level entry     Home Layout: One level Home Equipment: Grab bars - toilet;Grab bars - tub/shower;Shower seat;Walker - 2 wheels      Prior Function Level of Independence: Needs assistance   Gait / Transfers Assistance Needed: pt reports she was ambulatory without AD  ADL's / Homemaking Assistance Needed: requires assist for IADL's        Hand Dominance   Dominant Hand: Right    Extremity/Trunk Assessment   Upper Extremity Assessment Upper Extremity Assessment: Overall WFL for tasks assessed    Lower Extremity Assessment Lower Extremity Assessment: Generalized weakness    Cervical / Trunk Assessment Cervical / Trunk Assessment: Kyphotic  Communication   Communication: No difficulties  Cognition Arousal/Alertness: Awake/alert Behavior During Therapy: WFL for tasks assessed/performed Overall Cognitive Status: Within Functional Limits for tasks assessed                                        General Comments      Exercises     Assessment/Plan    PT Assessment Patent does not need any further PT services  PT Problem List Decreased strength;Decreased balance;Decreased activity tolerance;Decreased mobility;Pain       PT Treatment Interventions      PT Goals (Current goals can be found in the Care Plan section)  Acute Rehab PT Goals Patient Stated Goal: get out of the hospital PT Goal Formulation: With patient Time For Goal Achievement: 01/06/19 Potential to Achieve Goals: Good    Frequency     Barriers to discharge        Co-evaluation               AM-PAC PT "6 Clicks" Mobility  Outcome Measure Help needed turning from your back to your side while in a flat bed without using bedrails?: None Help needed moving from lying on your back to sitting on the side of a flat bed without using bedrails?: None Help needed moving to and from a bed to a chair (including a wheelchair)?: None Help needed standing  up from a chair using your arms (e.g., wheelchair or bedside chair)?: None Help needed to walk in hospital room?: None Help needed climbing 3-5 steps with a railing? : A Little 6 Click Score: 23    End of Session Equipment Utilized During Treatment: Gait belt Activity Tolerance: Patient tolerated treatment well Patient left: in chair;with call bell/phone within reach;with chair alarm set Nurse Communication: Mobility status PT Visit Diagnosis: History of falling (Z91.81);Pain Pain - part of body: (back, abdomen)    Time: [id number] PT Time Calculation (min) (ACUTE ONLY): 27 min   Charges:   PT Evaluation $PT Eval Moderate Complexity: 1 Mod PT Treatments $Therapeutic Activity: 8-22 mins        Allison Vaughan, PT, DPT Acute Rehabilitation Services Pager [phone] Office [phone]   Allison Vaughan 12/23/2018, 4:16 PM

## 2018-12-24 ENCOUNTER — Inpatient Hospital Stay (HOSPITAL_COMMUNITY): Payer: Medicare HMO

## 2018-12-24 ENCOUNTER — Telehealth: Payer: Self-pay | Admitting: *Deleted

## 2018-12-24 LAB — BASIC METABOLIC PANEL
Anion gap: 11 (ref 5–15)
BUN: 16 mg/dL (ref 8–23)
CO2: 23 mmol/L (ref 22–32)
Calcium: 8 mg/dL — ABNORMAL LOW (ref 8.9–10.3)
Chloride: 94 mmol/L — ABNORMAL LOW (ref 98–111)
Creatinine, Ser: 1.08 mg/dL — ABNORMAL HIGH (ref 0.44–1.00)
GFR calc Af Amer: 58 mL/min — ABNORMAL LOW (ref >60)
GFR calc non Af Amer: 50 mL/min — ABNORMAL LOW (ref >60)
Glucose, Bld: 103 mg/dL — ABNORMAL HIGH (ref 70–99)
Potassium: 4.3 mmol/L (ref 3.5–5.1)
Sodium: 128 mmol/L — ABNORMAL LOW (ref 135–145)

## 2018-12-24 LAB — MAGNESIUM: Magnesium: 1.7 mg/dL (ref 1.7–2.4)

## 2018-12-24 LAB — PHOSPHORUS: Phosphorus: 2.6 mg/dL (ref 2.5–4.6)

## 2018-12-24 MED ORDER — MAGNESIUM SULFATE 2 GM/50ML IV SOLN
2.0000 g | Freq: Once | INTRAVENOUS | Status: AC
  Administered 2018-12-24: 2 g via INTRAVENOUS
  Filled 2018-12-24: qty 50

## 2018-12-24 NOTE — Telephone Encounter (Signed)
[employer] to ship a 30 day cardiac event monitor to the patients home.  Instructions included in monitor kit.

## 2018-12-24 NOTE — Progress Notes (Signed)
PROGRESS NOTE  Allison Vaughan [id number] DOB: 09-Aug-1945 DOA: 12/21/2018 PCP: [name], MD  Brief history:  Per ED intake on 12/8 at 7:30 am: Pt arrives c/o of being cold and agitation, up to [facility] to poop small amts of diarrhea pt  Had poop on her socks , clothing and gown , sheets. Pt constantly calling out and will not keep any leads or bp on, pt cleaned and bed was changed  Per admitting MD Dr Maudie Mercury at 12/8 1:49am:  Allison Vaughan  is a 73 y.o. 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 PT states that heroin was provided "by a friend".  PT also reported that she has participated in Narcotics Anonymous in the past, and that she is willing to go again, but that she has no transportation."  Family denies patient 's history of heroin use, daughter who is the main caregiver state patient is confused.  Time patient used nasal heroine was a year ago.   HPI/Recap of past 24 hours:   She is npo this am , awaiting for mrcp She is currently alert and oriented, she report no more nausea since in the hospital She started have diarrhea after the enema, diarrhea seems has improved, denies ab pain  Assessment/Plan: Active Problems:   Essential hypertension   Hypokalemia   Hyponatremia   Nausea & vomiting   Acute encephalopathy   Prolonged QT interval   Hypomagnesemia   Constipation   Dilation of pancreatic duct  Nausea /vomitingx3 days at home --CT scan on presentation "1. Large amount of stool at the level of the rectum. Correlate for symptoms of fecal impaction. 2. Persistent dilatation of the distal pancreatic duct with abrupt cutoff at the level of the  pancreatic body. Further evaluation with a nonemergent outpatient contrast enhanced MRI is recommended. 3. Nonobstructive left nephrolithiasis. 4. Subacute anterior right rib fractures. Aortic Atherosclerosis (ICD10-I70.0)." -n/v Possibly due to fecal impaction, she received enema, no more n/v since arrival to the floor  appears to tolerate regular diet   Persistent dilatation of the distal pancreatic duct with abrupt cutoff at the level of the pancreatic body. Further evaluation with a nonemergent outpatient contrast enhanced MRI is recommended. -This was noted in last discharge summary in June 2020, she was not able to tolerate MRI in the hospital, he is instructed to have outpatient follow-up -Per chart review there are several no-shows with GI, cardiology and PCP in the past -We will proceed with MRI /MRCP here  Hyponatremia sHe appears dehydrated clinically ,discontinue Lasix discontinued on 12/9, sodium remain low, will continue gentle hydration with normal saline/[id number] at 75 cc/hr, encourage oral intake -Mag 1.7, will give 2 g of mag IV today - Phos 2.6 -Repeat BMP in the morning   hypokalemia  potassium 2.9 on presentation, normalized  QTC prolongation -Multifactorial including electrolyte abnormality  -QTc 766 on presentation , QTC has improved today 462 after correcting electrolyte abnormalities  -DC telemetry  Acute metabolic encephalopathy -Possibly related to withdrawal in the setting of fecal impaction and metabolic derangement, although per daughter patient does not have recent substance abuse, report patient got confused whenever she has electrolyte abnormality -She was agitated  and 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  Patient was in the process of taking of methadone at the time. -Today she is doing better currently alert and oriented x3 uncooperative, no confusion  Possible gastritis Stomach duodenum no acute findings on CT abdomen pelvis on presentation She is on PPI  Chronic pain was on methadone in the past, she was taken off methadone 38-month ago substance abuse?, patient reports recent nasal heroin use, per daughter patient is confused, patient did not have any recent heroin use at all Social worker consulted, input appreciated  History of paroxysmal A. Fib -EKG documented afib in 03/2018 -He is on Cardizem and Eliquis , per daughter she is taken off eliquis for about 6 months due to unclear evidence of afib, daughter is willing to restart Eliquis -Daughter also reports Cardizem caused edema, " every time she is off Cardizem her edema resolves" per daughter's report.  She is started on Lasix for edema after starting Cardizem in the past -She is sinus rhythm with intermittent sinus tachycardia on telemetry -We will discontinue Cardizem change to beta-blocker -Daughter agrees to follow-up with cardiology and outpatient cardiac monitoring  CAD  Cont Plavix 75mg  po qday Cont Lipitor 10mg  po qhs Follow-up with cardiology  FTT; patient appeared very weak initially, she has improved, she did well with physical therapy.  Physical therapy did not recommend home health  Several no-shows in the past per chart review, daughter reports it is difficult to get patient to agreed to go to doctor's appointment at times , daughter state she is going to try to convince.patient to follow-up with her doctors   DVT Prophylaxis: On Eliquis  Code Status: Full  Family Communication: patient , daughter who is the main caregiver [name] over the phone [phone]   Disposition Plan: hopefully home tomorrow pending mrcp result and sodium  improvement   Consultants:  None  Procedures:  None  Antibiotics:  None   Objective: BP (!) 148/64 (BP Location: Left Arm)    Pulse (!) 56    Temp 98.3 F (36.8 C) (Oral)    Resp 18    Wt 45 kg    SpO2 96%    BMI 19.38 kg/m   Intake/Output Summary (Last 24 hours) at 12/24/2018 0944 Last data filed at 12/24/2018 0615 Gross per 24 hour  Intake 1894.89 ml  Output --  Net 1894.89 ml   Filed Weights   12/23/18 2059  Weight: 45 kg    Exam: Patient is examined daily including today on 12/24/2018, exams remain the same as of yesterday except that has changed    General:  NAD, appear stronger today  Cardiovascular: RRR  Respiratory: CTABL  Abdomen: Soft/ND/NT, positive BS  Musculoskeletal: No Edema  Neuro: alert, oriented x3, pleasant  Data Reviewed: Basic Metabolic Panel: Recent Labs  Lab 12/21/18 1657 12/21/18 1718 12/22/18 0550 12/23/18 0401 12/23/18 1422 12/24/18 0355  NA 131* 130* 137 129* 126* 128*  K 2.8* 2.8* 2.8* 2.9* 2.9* 4.3  CL 88* 88* 89* 90* 88* 94*  CO2 31  --  28 25 25 23   GLUCOSE 122* 118* 135* 98 100* 103*  BUN 9 8 7* 15 14 16   CREATININE 0.93 0.80 0.93 1.04* 1.20* 1.08*  CALCIUM 9.2  --  9.0 8.0* 8.2* 8.0*  MG 1.7  --   --   --  1.7 1.7  PHOS  --   --   --   --   --  2.6   Liver Function Tests: Recent Labs  Lab 12/21/18 1657 12/22/18 0550 12/23/18 0401  AST 16 19 15   ALT 9 9 8   ALKPHOS 108 115 83  BILITOT 0.6 0.5 0.1*  PROT 6.5 6.9 5.1*  ALBUMIN 4.0 4.0 3.2*   Recent Labs  Lab 12/21/18 1657  LIPASE 17   No results for input(s): AMMONIA in the last 168 hours. CBC: Recent Labs  Lab 12/21/18 1657 12/21/18 1718 12/22/18 0550  WBC 4.2  --  10.0  NEUTROABS 2.7  --   --   HGB 13.7 13.3 15.0  HCT 38.6 39.0 41.7  MCV 82.1  --  82.7  PLT 271  --  343   Cardiac Enzymes:   No results for input(s): CKTOTAL, CKMB, CKMBINDEX, TROPONINI in the last 168 hours. BNP (last 3 results) Recent Labs    03/25/18 2358  07/08/18 2345 08/20/18 0951  BNP 1,025.1* 147.3* 39.9    ProBNP (last 3 results) No results for input(s): PROBNP in the last 8760 hours.  CBG: No results for input(s): GLUCAP in the last 168 hours.  Recent Results (from the past 240 hour(s))  Urine culture     Status: Abnormal   Collection Time: 12/21/18  6:54 PM   Specimen: Urine, Random  Result Value Ref Range Status   Specimen Description URINE, RANDOM  Final   Special Requests NONE  Final   Culture (A)  Final    <10,000 COLONIES/mL INSIGNIFICANT GROWTH Performed at [facility], [address], [place], [place] [address]    Report Status 12/22/2018 FINAL  Final  SARS CORONAVIRUS 2 (TAT 6-24 HRS) Nasopharyngeal Urine, Clean Catch     Status: None   Collection Time: 12/21/18  6:54 PM   Specimen: Urine, Clean Catch; Nasopharyngeal  Result Value Ref Range Status   SARS Coronavirus 2 NEGATIVE NEGATIVE Final    Comment: (NOTE) SARS-CoV-2 target nucleic acids are NOT DETECTED. The SARS-CoV-2 RNA is generally detectable in upper and lower respiratory specimens during the acute phase of infection. Negative results do not preclude SARS-CoV-2 infection, do not rule out co-infections with other pathogens, and should not be used as the sole basis for treatment or other patient management decisions. Negative results must be combined with clinical observations, patient history, and epidemiological information. The expected result is Negative. Fact Sheet for Patients: SugarRoll.be Fact Sheet for Healthcare Providers: https://www.woods-mathews.com/ This test is not yet approved or cleared by the [place] FDA and  has been authorized for detection and/or diagnosis of SARS-CoV-2 by FDA under an Emergency Use Authorization (EUA). This EUA will remain  in effect (meaning this test can be used) for the duration of the COVID-19 declaration under Section 56 4(b)(1) of the Act, 21  U.S.C. section 360bbb-3(b)(1), unless the authorization is terminated or revoked sooner. Performed at [facility], [place] [address] [place],  [address]      Studies: No results found.  Scheduled Meds:  apixaban  5 mg Oral BID   atorvastatin  10 mg Oral QHS   cloNIDine  0.1 mg Oral QID   Followed by   cloNIDine  0.1 mg Oral BH-qamhs   Followed by   Derrill Memo ON 12/26/2018] cloNIDine  0.1 mg Oral QAC breakfast   clopidogrel  75 mg  Oral Daily   feeding supplement (ENSURE ENLIVE)  237 mL Oral Q24H   fluticasone  2 spray Each Nare Daily   LORazepam  0.5 mg Oral Daily   metoprolol tartrate  12.5 mg Oral BID   mometasone-formoterol  2 puff Inhalation BID   oxyCODONE  10 mg Oral BID   pantoprazole  40 mg Oral Daily   senna  1 tablet Oral QHS   umeclidinium bromide  1 puff Inhalation Daily    Continuous Infusions:  0.9 % NaCl with KCl 20 mEq / L 75 mL/hr at 12/24/18 0615   magnesium sulfate bolus IVPB       Time spent: 57mins I have personally reviewed and interpreted on  12/24/2018 daily labs, tele strips, imagings as discussed above under date review session and assessment and plans.  I reviewed all nursing notes, pharmacy notes, vitals, pertinent old records  I have discussed plan of care as described above with RN , patient and daughter over the phone on 12/24/2018   Florencia Reasons MD, PhD, FACP  [facility] Pager [phone]. If 7PM-7AM, please contact night-coverage at [email or web address], password [facility] 12/24/2018, 9:44 AM  LOS: 2 days

## 2018-12-25 ENCOUNTER — Other Ambulatory Visit: Payer: Self-pay | Admitting: Gastroenterology

## 2018-12-25 LAB — URINALYSIS, ROUTINE W REFLEX MICROSCOPIC
Bilirubin Urine: NEGATIVE
Glucose, UA: NEGATIVE mg/dL
Hgb urine dipstick: NEGATIVE
Ketones, ur: NEGATIVE mg/dL
Leukocytes,Ua: NEGATIVE
Nitrite: NEGATIVE
Protein, ur: NEGATIVE mg/dL
Specific Gravity, Urine: 1.008 (ref 1.005–1.030)
pH: 8 (ref 5.0–8.0)

## 2018-12-25 LAB — BASIC METABOLIC PANEL
Anion gap: 8 (ref 5–15)
BUN: 23 mg/dL (ref 8–23)
CO2: 23 mmol/L (ref 22–32)
Calcium: 8.2 mg/dL — ABNORMAL LOW (ref 8.9–10.3)
Chloride: 102 mmol/L (ref 98–111)
Creatinine, Ser: 1.38 mg/dL — ABNORMAL HIGH (ref 0.44–1.00)
GFR calc Af Amer: 43 mL/min — ABNORMAL LOW (ref >60)
GFR calc non Af Amer: 37 mL/min — ABNORMAL LOW (ref >60)
Glucose, Bld: 100 mg/dL — ABNORMAL HIGH (ref 70–99)
Potassium: 4.2 mmol/L (ref 3.5–5.1)
Sodium: 133 mmol/L — ABNORMAL LOW (ref 135–145)

## 2018-12-25 LAB — SARS CORONAVIRUS 2 (TAT 6-24 HRS): SARS Coronavirus 2: NEGATIVE

## 2018-12-25 LAB — MAGNESIUM: Magnesium: 2.4 mg/dL (ref 1.7–2.4)

## 2018-12-25 MED ORDER — SODIUM CHLORIDE 0.9 % IV SOLN
INTRAVENOUS | Status: DC
  Administered 2018-12-25 – 2018-12-26 (×2): via INTRAVENOUS

## 2018-12-25 MED ORDER — HYDRALAZINE HCL 10 MG PO TABS
10.0000 mg | ORAL_TABLET | Freq: Four times a day (QID) | ORAL | Status: DC | PRN
  Administered 2018-12-25 – 2018-12-26 (×2): 10 mg via ORAL
  Filled 2018-12-25 (×2): qty 1

## 2018-12-25 NOTE — Consult Note (Addendum)
Referring Provider: [facility] Primary Care Physician:  [name], MD Primary Gastroenterologist: Althia Forts  Reason for Consultation: Pancreatic mass  HPI: Allison Vaughan is a 73 y.o. female with past medical history of coronary disease on Plavix, history of atrial fibrillation on Eliquis, reported history of IV drug use and multiple medical problems as listed below presented to the hospital with nausea and vomiting.  Patient with known history of pancreatic lesions since June 2020.  MRI yesterday showed concerning finding of around 3.5 cm pancreatic mass along with dilation of PD.  GI is consulted for evaluation for possible EUS.  Patient seen and examined at bedside.  She denies any abdominal pain.  Nausea and vomiting has resolved.  She is complaining of chronic constipation since childhood.  Not sure of previous colonoscopy.  She had EGD many years ago.  Denies seeing any blood in the stool or black stool.  No family history of pancreatic cancer or colon cancer.  Denies unintentional weight loss  Past Medical History:  Diagnosis Date  . Accidental drug overdose   . Acute encephalopathy   . Acute on chronic respiratory failure with hypoxia ([place]) 03/01/2017  . Acute renal failure ([place]) 04/16/2014  . AKI (acute kidney injury) ([place]) 04/30/2014  . Altered mental status 06/27/2012  . Anxiety   . Atrial fibrillation ([place])   . CAD (coronary artery disease)   . CAP (community acquired pneumonia) 04/18/2014  . Cardiomyopathy ([place])   . Chronic hypoxemic respiratory failure ([place]) 06/26/2012  . Chronic pain   . COPD exacerbation ([place]) 06/26/2012  . COPD with acute exacerbation ([place]) 03/01/2017  . DEPRESSIVE DISORDER, NOS 03/13/2006   Qualifier: Diagnosis of  By: Damita Dunnings MD, Phillip Heal    . Edema, peripheral   . Essential hypertension 04/30/2014  . Fall at home, initial encounter 03/01/2017  . GASTROESOPHAGEAL REFLUX, NO ESOPHAGITIS 03/13/2006   Qualifier: Diagnosis of  By: Damita Dunnings MD, Phillip Heal    .  HYPERCHOLESTEROLEMIA 03/13/2006   Qualifier: Diagnosis of  By: Damita Dunnings MD, Phillip Heal    . Hyperlipidemia   . HYPERTENSION, BENIGN SYSTEMIC 03/13/2006   Qualifier: Diagnosis of  By: Damita Dunnings MD, Phillip Heal    . Hypokalemia 04/30/2014  . Hyponatremia 04/30/2014  . Hypotension 04/30/2014  . Hypoxemia 06/26/2012  . Hypoxia 03/26/2018  . Irritable bowel syndrome 03/13/2006   Qualifier: Diagnosis of  By: Damita Dunnings MD, Phillip Heal    . Lung nodule 04/16/2014  . MENOPAUSAL SYNDROME 03/13/2006   Qualifier: Diagnosis of  By: Damita Dunnings MD, Phillip Heal    . Mood disorder [facility]) 03/13/2006   Qualifier: Diagnosis of  By: Damita Dunnings MD, Phillip Heal    . 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 Nausea 04/30/2014  . Nausea vomiting and diarrhea 04/16/2014  . OSTEOARTHRITIS, MULTI SITES 03/13/2006   Qualifier: Diagnosis of  By: Damita Dunnings MD, Phillip Heal    . Pulmonary nodule 04/30/2014  . Respiratory failure ([place]) 01/22/2017  . TOBACCO DEPENDENCE 03/13/2006   Qualifier: Diagnosis of  By: Damita Dunnings MD, Phillip Heal    . Weight loss     Past Surgical History:  Procedure Laterality Date  . EYE SURGERY    . ROTATOR CUFF REPAIR Right 2012    Prior to Admission medications   Medication Sig Start Date End Date Taking? Authorizing Provider  albuterol (PROVENTIL HFA;VENTOLIN HFA) 108 (90 BASE) MCG/ACT inhaler Inhale 2 puffs into the lungs every 6 (six) hours as needed for wheezing. 06/09/12  Yes Oti, Brantley Stage, MD  albuterol (PROVENTIL) (2.5 MG/3ML) 0.083% nebulizer solution Take 3 mLs (2.5  mg total) by nebulization every 2 (two) hours as needed for wheezing. 04/20/14  Yes Rama, Venetia Maxon, MD  atorvastatin (LIPITOR) 10 MG tablet Take 1 tablet (10 mg total) by mouth at bedtime. 03/28/18  Yes Cherene Altes, MD  diltiazem (CARDIZEM CD) 300 MG 24 hr capsule Take 1 capsule (300 mg total) by mouth daily. 03/28/18 03/28/19 Yes Cherene Altes, MD  diltiazem (TIAZAC) 300 MG 24 hr capsule Take 300 mg by mouth daily.   Yes [provider]  DULoxetine HCl 40 MG CPEP Take 1  tablet by mouth daily.   Yes [provider]  feeding supplement, ENSURE ENLIVE, (ENSURE ENLIVE) LIQD Take 237 mLs by mouth 2 (two) times daily between meals. Patient taking differently: Take 237 mLs by mouth daily.  03/03/17  Yes Aline August, MD  furosemide (LASIX) 20 MG tablet Take 20 mg by mouth daily.  08/04/18  Yes [provider]  ibuprofen (ADVIL) 200 MG tablet Take 400 mg by mouth every 6 (six) hours as needed for moderate pain.   Yes [provider]  LORazepam (ATIVAN) 0.5 MG tablet Take 0.5 mg by mouth daily.    Yes [provider]  Oxycodone HCl 10 MG TABS Take 10 mg by mouth 2 (two) times daily.  03/23/18  Yes [provider]  potassium chloride SA (K-DUR) 20 MEQ tablet Take 20 mEq by mouth daily.  05/23/18  Yes [provider]    Scheduled Meds: . apixaban  5 mg Oral BID  . atorvastatin  10 mg Oral QHS  . cloNIDine  0.1 mg Oral BH-qamhs   Followed by  . [START ON 12/26/2018] cloNIDine  0.1 mg Oral QAC breakfast  . clopidogrel  75 mg Oral Daily  . feeding supplement (ENSURE ENLIVE)  237 mL Oral Q24H  . fluticasone  2 spray Each Nare Daily  . LORazepam  0.5 mg Oral Daily  . metoprolol tartrate  12.5 mg Oral BID  . mometasone-formoterol  2 puff Inhalation BID  . oxyCODONE  10 mg Oral BID  . pantoprazole  40 mg Oral Daily  . senna  1 tablet Oral QHS  . umeclidinium bromide  1 puff Inhalation Daily   Continuous Infusions: PRN Meds:.acetaminophen **OR** acetaminophen, albuterol, dicyclomine, hydrALAZINE, hydrOXYzine, loperamide, methocarbamol, ondansetron, prochlorperazine  Allergies as of 12/21/2018  . (No Known Allergies)    Family History  Problem Relation Age of Onset  . CAD Father   . Lung cancer Brother   . Healthy Son   . Healthy Daughter   . Healthy Daughter     Social History   Socioeconomic History  . Marital status: Single    Spouse name: Not on file  . Number of children: Not on file  . Years of  education: Not on file  . Highest education level: Not on file  Occupational History  . Not on file  Tobacco Use  . Smoking status: Current Every Day Smoker    Packs/day: 1.00    Types: Cigarettes  . Smokeless tobacco: Never Used  . Tobacco comment: states she is trying to quit  Substance and Sexual Activity  . Alcohol use: No    Comment: not in a long time  . Drug use: No  . Sexual activity: Not on file  Other Topics Concern  . Not on file  Social History Narrative  . Not on file   Social Determinants of Health   Financial Resource Strain:   . Difficulty of Paying Living  Expenses: Not on file  Food Insecurity:   . Worried About Charity fundraiser in the Last Year: Not on file  . Ran Out of Food in the Last Year: Not on file  Transportation Needs:   . Lack of Transportation (Medical): Not on file  . Lack of Transportation (Non-Medical): Not on file  Physical Activity:   . Days of Exercise per Week: Not on file  . Minutes of Exercise per Session: Not on file  Stress:   . Feeling of Stress : Not on file  Social Connections:   . Frequency of Communication with Friends and Family: Not on file  . Frequency of Social Gatherings with Friends and Family: Not on file  . Attends Religious Services: Not on file  . Active Member of Clubs or Organizations: Not on file  . Attends Archivist Meetings: Not on file  . Marital Status: Not on file  Intimate Partner Violence:   . Fear of Current or Ex-Partner: Not on file  . Emotionally Abused: Not on file  . Physically Abused: Not on file  . Sexually Abused: Not on file    Review of Systems:Review of Systems  Constitutional: Negative for chills, fever, malaise/fatigue and weight loss.  HENT: Negative for hearing loss and tinnitus.   Eyes: Negative for blurred vision and double vision.  Respiratory: Negative for cough and hemoptysis.   Cardiovascular: Negative for chest pain and palpitations.  Gastrointestinal: Positive  for constipation. Negative for abdominal pain, blood in stool, diarrhea, heartburn, melena, nausea and vomiting.  Genitourinary: Negative for dysuria and urgency.  Musculoskeletal: Positive for joint pain and myalgias.  Skin: Negative for itching and rash.  Neurological: Negative for seizures and loss of consciousness.  Endo/Heme/Allergies: Does not bruise/bleed easily.  Psychiatric/Behavioral: Negative for memory loss. The patient is nervous/anxious.     Physical Exam: Vital signs: Vitals:   12/25/18 0301 12/25/18 0755  BP: (!) 177/81 (!) 195/92  Pulse: (!) 55 61  Resp: 18 18  Temp: 97.6 F (36.4 C) 100 F (37.8 C)  SpO2: 98% 97%   Last BM Date: 12/24/18 Physical Exam  Constitutional: She is oriented to person, place, and time. She appears well-developed and well-nourished. No distress.  HENT:  Head: Normocephalic and atraumatic.  Mouth/Throat: Oropharynx is clear and moist. No oropharyngeal exudate.  Eyes: EOM are normal. No scleral icterus.  Cardiovascular: Normal rate and normal heart sounds.  Pulmonary/Chest: Effort normal and breath sounds normal. No respiratory distress.  Abdominal: Soft. Bowel sounds are normal. She exhibits no distension. There is no abdominal tenderness. There is no rebound and no guarding.  Musculoskeletal:        General: No edema. Normal range of motion.     Cervical back: Normal range of motion and neck supple.  Neurological: She is alert and oriented to person, place, and time.  Skin: Skin is warm. No erythema.  Psychiatric: She has a normal mood and affect. Judgment and thought content normal.  Nursing note and vitals reviewed.   GI:  Lab Results: No results for input(s): WBC, HGB, HCT, PLT in the last 72 hours. BMET Recent Labs    12/23/18 1422 12/24/18 0355 12/25/18 0252  NA 126* 128* 133*  K 2.9* 4.3 4.2  CL 88* 94* 102  CO2 25 23 23   GLUCOSE 100* 103* 100*  BUN 14 16 23   CREATININE 1.20* 1.08* 1.38*  CALCIUM 8.2* 8.0* 8.2*    LFT Recent Labs    12/23/18 0401  PROT 5.1*  ALBUMIN 3.2*  AST 15  ALT 8  ALKPHOS 83  BILITOT 0.1*   PT/INR No results for input(s): LABPROT, INR in the last 72 hours.   Studies/Results: MR ABDOMEN MRCP WO CONTRAST  Result Date: 12/24/2018 CLINICAL DATA:  Pancreatic ductal dilatation. 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 EXAM: MRI ABDOMEN WITHOUT CONTRAST  (INCLUDING MRCP) TECHNIQUE: Multiplanar multisequence MR imaging of the abdomen was performed. Heavily T2-weighted images of the biliary and pancreatic ducts were obtained, and three-dimensional MRCP images were rendered by post processing. COMPARISON:  CT scan 12/21/2018 FINDINGS: Unfortunate, the examination is limited due to breathing motion artifact and lack of IV contrast. Lower chest: The lung bases are grossly clear. No obvious pulmonary nodules or pleural effusions. No pericardial effusion. Hiatal hernia noted. Hepatobiliary: No hepatic lesions or intrahepatic biliary dilatation. The gallbladder is unremarkable. No gallstones or findings for acute cholecystitis. Normal caliber and course of the common bile duct. Pancreas: As demonstrated on the CT scan there is fairly marked distention of the pancreatic duct in the tail region with an abrupt cut off in the body region. Maximum distention is 7 mm. Although not well demonstrated there does appear to be an infiltrating mass in the pancreatic body causing the pancreatic duct obstruction and marked atrophy of the pancreatic tail. The pancreatic head demonstrates normal signal intensity and no obvious mass. Difficult to assess the surrounding vascular structures on the MRI. No obvious direct vascular invasion on the CT scan. No obvious peripancreatic adenopathy. Spleen:  Elongated spleen but no overt splenomegaly. Adrenals/Urinary Tract: The adrenal glands and kidneys are grossly normal. No worrisome renal lesions or hydronephrosis. Stomach/Bowel: The stomach, duodenum,  visualized small bowel and visualize colon are grossly normal despite breathing motion artifact. Vascular/Lymphatic: Advanced atherosclerotic calcifications involving the aorta and branch vessels without obvious aneurysm. No obvious mesenteric or retroperitoneal adenopathy. Other:  No ascites. Musculoskeletal: No significant bony findings. IMPRESSION: 1. Suspect a 3.5 cm infiltrating pancreatic body neoplasm causing obstruction of the main pancreatic duct and atrophy of the pancreatic tail. The pancreatic head appears normal. 2. Limited examination due to lack of IV contrast and breathing motion artifact. Difficult to assess the vascular structures for invasion. 3. Recommend endoscopic biopsy. A dedicated high-resolution pancreatic CT scan with contrast may be helpful for further evaluation. 4. No obvious metastatic disease. Electronically Signed   By: Marijo Sanes M.D.   On: 12/24/2018 15:07   MR 3D Recon At Scanner  Result Date: 12/24/2018 CLINICAL DATA:  Pancreatic ductal dilatation. 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 EXAM: MRI ABDOMEN WITHOUT CONTRAST  (INCLUDING MRCP) TECHNIQUE: Multiplanar multisequence MR imaging of the abdomen was performed. Heavily T2-weighted images of the biliary and pancreatic ducts were obtained, and three-dimensional MRCP images were rendered by post processing. COMPARISON:  CT scan 12/21/2018 FINDINGS: Unfortunate, the examination is limited due to breathing motion artifact and lack of IV contrast. Lower chest: The lung bases are grossly clear. No obvious pulmonary nodules or pleural effusions. No pericardial effusion. Hiatal hernia noted. Hepatobiliary: No hepatic lesions or intrahepatic biliary dilatation. The gallbladder is unremarkable. No gallstones or findings for acute cholecystitis. Normal caliber and course of the common bile duct. Pancreas: As demonstrated on the CT scan there is fairly marked distention of the pancreatic duct in the tail region with an  abrupt cut off in the body region. Maximum distention is 7 mm. Although not well demonstrated there does appear to be an infiltrating mass in the pancreatic body causing the pancreatic duct obstruction  and marked atrophy of the pancreatic tail. The pancreatic head demonstrates normal signal intensity and no obvious mass. Difficult to assess the surrounding vascular structures on the MRI. No obvious direct vascular invasion on the CT scan. No obvious peripancreatic adenopathy. Spleen:  Elongated spleen but no overt splenomegaly. Adrenals/Urinary Tract: The adrenal glands and kidneys are grossly normal. No worrisome renal lesions or hydronephrosis. Stomach/Bowel: The stomach, duodenum, visualized small bowel and visualize colon are grossly normal despite breathing motion artifact. Vascular/Lymphatic: Advanced atherosclerotic calcifications involving the aorta and branch vessels without obvious aneurysm. No obvious mesenteric or retroperitoneal adenopathy. Other:  No ascites. Musculoskeletal: No significant bony findings. IMPRESSION: 1. Suspect a 3.5 cm infiltrating pancreatic body neoplasm causing obstruction of the main pancreatic duct and atrophy of the pancreatic tail. The pancreatic head appears normal. 2. Limited examination due to lack of IV contrast and breathing motion artifact. Difficult to assess the vascular structures for invasion. 3. Recommend endoscopic biopsy. A dedicated high-resolution pancreatic CT scan with contrast may be helpful for further evaluation. 4. No obvious metastatic disease. Electronically Signed   By: Marijo Sanes M.D.   On: 12/24/2018 15:07    Impression/Plan: -Around 3.5 cm infiltrating pancreatic body mass causing obstruction of main PD and pancreatic ductal dilation.  MRI was done without contrast.  CT scan in June 2020 showed questionable lesion in the pancreas.  Follow-up CT abdomen pelvis in August with IV contrast did not showed any pancreatic lesion.  Patient denies any  weight loss. -History of coronary artery disease.  On Plavix -History of atrial fibrillation.  On Eliquis  Recommendations ----------------------- -Finding concerning for pancreatic mass discussed with the patient.  Patient does not want to stay in the hospital to complete the work-up at this time.  Her LFTs are normal. -She will need EUS with possible FNA after holding Eliquis for 2 days and Plavix for 5 days.  -CA 19-9 ordered by primary team. -I will discuss with Dr. Paulita Fujita.  No further inpatient GI work-up planned.  GI will sign off.  Call us back if needed -Need for outpatient follow-up discussed with the patient.  According to patient, her daughter will bring her for office visit and for procedure as an outpatient if needed.    LOS: 3 days   Otis Brace  MD, FACP 12/25/2018, 10:39 AM  Contact #  [phone]

## 2018-12-25 NOTE — Progress Notes (Addendum)
PROGRESS NOTE  Allison Vaughan [id number] DOB: 1945-06-03 DOA: 12/21/2018 PCP: [name], MD  Brief history:  Per ED intake on 12/8 at 7:30 am: Pt arrives c/o of being cold and agitation, up to [facility] to poop small amts of diarrhea pt  Had poop on her socks , clothing and gown , sheets. Pt constantly calling out and will not keep any leads or bp on, pt cleaned and bed was changed  Per admitting MD Dr Maudie Mercury at 12/8 1:49am:  Allison Vaughan  is a 73 y.o. 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 PT states that heroin was provided "by a friend".  PT also reported that she has participated in Narcotics Anonymous in the past, and that she is willing to go again, but that she has no transportation."  Family denies patient 's history of heroin use, daughter who is the main caregiver state patient is confused.  Time patient used nasal heroine was a year ago.   HPI/Recap of past 24 hours:   Cr worsening, mrcp showed possible pancreatic body neoplasm, recommend endoscopic biopsy  She is currently alert and oriented, she report no more nausea since in the hospital She reports bowel movements are now regular, denies ab pain  Assessment/Plan: Active Problems:   Essential hypertension   Hypokalemia   Hyponatremia   Nausea & vomiting   Acute encephalopathy   Prolonged QT interval   Hypomagnesemia   Constipation   Dilation of pancreatic duct  Nausea /vomitingx3 days at home --CT scan on presentation "1. Large amount of stool at the level of the rectum. Correlate for symptoms of fecal impaction. 2. Persistent dilatation of the distal pancreatic duct with abrupt cutoff at  the level of the pancreatic body. Further evaluation with a nonemergent outpatient contrast enhanced MRI is recommended. 3. Nonobstructive left nephrolithiasis. 4. Subacute anterior right rib fractures. Aortic Atherosclerosis (ICD10-I70.0)." -n/v Possibly due to fecal impaction, she received enema, no more n/v since arrival to the floor  appears to tolerate regular diet   Persistent dilatation of the distal pancreatic duct with abrupt cutoff at the level of the pancreatic body.  -This was noted in last discharge summary in June 2020, she was not able to tolerate MRI in the hospital,  -Per chart review there are several no-shows with GI, cardiology and PCP in the past -She agreed to have MRCP done this time concerning for pancreatic neoplasm - discussed with GI Dr. Alessandra Bevels who recommend outpatient follow-up with [facility] Dr. Paulita Fujita for EUS/FNA, need to hold Eliquis for 2 days and Plavix for 5 days prior to the procedure. -Patient is aware of the plan and agreed to proceed outpatient follow-up  Addendum: Case discussed with [name] Dr Paulita Fujita called me recommends stop eliquis/plavix, plan for outpatient [facility]FNA at [facility] on Tuesday at 11:30 am   Possible gastritis Stomach duodenum no acute findings on CT abdomen pelvis on presentation She is on PPI Follow-up with [name] Dr. Paulita Fujita   AKI on CKD 2 -Creatinine went up to 1.38 today, she denies urinary symptom, will obtain UA, bladder ultrasound -We will give another days of hydration, repeat BMP in the morning  Hyponatremia sHe appears dehydrated clinically ,discontinue Lasix discontinued on 12/9, sodium remain low, will continue gentle hydration with normal saline at 75 cc/hr, encourage oral intake -Mag 2.4 - Phos 2.6 -Repeat BMP in the morning   hypokalemia  potassium 2.9 on presentation, normalized  QTC prolongation -Multifactorial including electrolyte abnormality  -QTc 766 on presentation , QTC has improved today 450  after correcting electrolyte abnormalities  -DC telemetry  Acute metabolic encephalopathy -Possibly related to withdrawal in the setting of fecal impaction and metabolic derangement, although per daughter patient does not have recent substance abuse, report patient got confused whenever she has electrolyte abnormality -She was agitated and irritable on presentation required safety sitter sitter, per chart review she was hospitalized in January 2019 due to encephalopathy and was intubated for airway protection, encephalopathy at that time was attributed to withdraw ( Presumed benzodiazepine and opiate withdrawal.),  Per daughter that episode was likely from withdrawing.  Patient was in the process of taking of methadone at the time. -Encephalopathy has resolved ,  she has been alert and oriented x3, no confusion, pleasant and cooperative for the last 2 days -Today she told me, she did use heroin to snorts prior to coming to the hospital.  She told me she got it from a friend of her age when her daughter was not home.  Her daughter was not aware of it.   Chronic pain was on methadone in the past, she was taken off methadone 43-month ago substance abuse?, patient reports recent nasal heroin use, per daughter patient is confused, patient did not have any recent heroin use at all Social worker consulted, input appreciated  History of paroxysmal A. Fib -EKG documented afib in 03/2018 -He is on Cardizem and Eliquis , per daughter she is taken off eliquis for about 6 months due to unclear evidence of afib, daughter is willing to restart Eliquis -Daughter also reports Cardizem caused edema, " every time she is off Cardizem her edema resolves" per daughter's report.  She is started on Lasix for edema after starting Cardizem in the past -She is sinus rhythm with intermittent sinus tachycardia on telemetry -We will discontinue Cardizem change to beta-blocker -Daughter agrees to follow-up with cardiology and  outpatient cardiac monitoring  CAD  EKG showed old anterior infarct, no acute ST-T changes, she denies of chest pain Cont Plavix 75mg  po qday Cont Lipitor 10mg  po qhs Follow-up with cardiology  FTT; patient appeared very weak initially, she has improved, she did well with physical therapy.  Physical therapy did not recommend home health  Several no-shows in the past per chart review, daughter reports it is difficult to get patient to agreed to go to doctor's appointment at times , daughter state she is going to try to convince.patient to follow-up with her doctors   DVT Prophylaxis: On Eliquis  Code Status: Full  Family Communication: patient , daughter who is the main caregiver [name] over the phone [phone]   Disposition Plan: hopefully home tomorrow pending cr and sodium improvement   Consultants:  [name]  Procedures:  None  Antibiotics:  None   Objective: BP (!) 195/92 (BP Location: Left Arm)   Pulse 61   Temp 100 F (37.8 C) (Oral)   Resp 18   Wt 44.8 kg   SpO2 97%   BMI 19.29 kg/m   Intake/Output Summary (Last 24 hours) at 12/25/2018 0911 Last data filed at 12/24/2018 2255 Gross per 24 hour  Intake 720 ml  Output --  Net  720 ml   Filed Weights   12/23/18 2059 12/25/18 0457  Weight: 45 kg 44.8 kg    Exam: Patient is examined daily including today on 12/25/2018, exams remain the same as of yesterday except that has changed    General:  NAD, AAOx3, very pleasant and cooperative  Cardiovascular: RRR  Respiratory: CTABL  Abdomen: Soft/ND/NT, positive BS  Musculoskeletal: No Edema  Neuro: alert, oriented x3, pleasant  Data Reviewed: Basic Metabolic Panel: Recent Labs  Lab 12/21/18 1657 12/22/18 0550 12/23/18 0401 12/23/18 1422 12/24/18 0355 12/25/18 0252  NA 131* 137 129* 126* 128* 133*  K 2.8* 2.8* 2.9* 2.9* 4.3 4.2  CL 88* 89* 90* 88* 94* 102  CO2 31 28 25 25 23 23   GLUCOSE 122* 135* 98 100* 103* 100*  BUN 9 7* 15 14 16 23    CREATININE 0.93 0.93 1.04* 1.20* 1.08* 1.38*  CALCIUM 9.2 9.0 8.0* 8.2* 8.0* 8.2*  MG 1.7  --   --  1.7 1.7 2.4  PHOS  --   --   --   --  2.6  --    Liver Function Tests: Recent Labs  Lab 12/21/18 1657 12/22/18 0550 12/23/18 0401  AST 16 19 15   ALT 9 9 8   ALKPHOS 108 115 83  BILITOT 0.6 0.5 0.1*  PROT 6.5 6.9 5.1*  ALBUMIN 4.0 4.0 3.2*   Recent Labs  Lab 12/21/18 1657  LIPASE 17   No results for input(s): AMMONIA in the last 168 hours. CBC: Recent Labs  Lab 12/21/18 1657 12/21/18 1718 12/22/18 0550  WBC 4.2  --  10.0  NEUTROABS 2.7  --   --   HGB 13.7 13.3 15.0  HCT 38.6 39.0 41.7  MCV 82.1  --  82.7  PLT 271  --  343   Cardiac Enzymes:   No results for input(s): CKTOTAL, CKMB, CKMBINDEX, TROPONINI in the last 168 hours. BNP (last 3 results) Recent Labs    03/25/18 2358 07/08/18 2345 08/20/18 0951  BNP 1,025.1* 147.3* 39.9    ProBNP (last 3 results) No results for input(s): PROBNP in the last 8760 hours.  CBG: No results for input(s): GLUCAP in the last 168 hours.  Recent Results (from the past 240 hour(s))  Urine culture     Status: Abnormal   Collection Time: 12/21/18  6:54 PM   Specimen: Urine, Random  Result Value Ref Range Status   Specimen Description URINE, RANDOM  Final   Special Requests NONE  Final   Culture (A)  Final    <10,000 COLONIES/mL INSIGNIFICANT GROWTH Performed at [facility], [address] [place], [place] [address]    Report Status 12/22/2018 FINAL  Final  SARS CORONAVIRUS 2 (TAT 6-24 HRS) Nasopharyngeal Urine, Clean Catch     Status: None   Collection Time: 12/21/18  6:54 PM   Specimen: Urine, Clean Catch; Nasopharyngeal  Result Value Ref Range Status   SARS Coronavirus 2 NEGATIVE NEGATIVE Final    Comment: (NOTE) SARS-CoV-2 target nucleic acids are NOT DETECTED. The SARS-CoV-2 RNA is generally detectable in upper and lower respiratory specimens during the acute phase of infection. Negative results do not  preclude SARS-CoV-2 infection, do not rule out co-infections with other pathogens, and should not be used as the sole basis for treatment or other patient management decisions. Negative results must be combined with clinical observations, patient history, and epidemiological information. The expected result is Negative. Fact Sheet for Patients: SugarRoll.be Fact Sheet for Healthcare Providers: https://www.woods-mathews.com/  This test is not yet approved or cleared by the [place] and  has been authorized for detection and/or diagnosis of SARS-CoV-2 by FDA under an Emergency Use Authorization (EUA). This EUA will remain  in effect (meaning this test can be used) for the duration of the COVID-19 declaration under Section 56 4(b)(1) of the Act, 21 U.S.C. section 360bbb-3(b)(1), unless the authorization is terminated or revoked sooner. Performed at [facility], [place] [address], [place], [place] [address]      Studies: MR ABDOMEN MRCP WO CONTRAST  Result Date: 12/24/2018 CLINICAL DATA:  Pancreatic ductal dilatation. 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 EXAM: MRI ABDOMEN WITHOUT CONTRAST  (INCLUDING MRCP) TECHNIQUE: Multiplanar multisequence MR imaging of the abdomen was performed. Heavily T2-weighted images of the biliary and pancreatic ducts were obtained, and three-dimensional MRCP images were rendered by post processing. COMPARISON:  CT scan 12/21/2018 FINDINGS: Unfortunate, the examination is limited due to breathing motion artifact and lack of IV contrast. Lower chest: The lung bases are grossly clear. No obvious pulmonary nodules or pleural effusions. No pericardial effusion. Hiatal hernia noted. Hepatobiliary: No hepatic lesions or intrahepatic biliary dilatation. The gallbladder is unremarkable. No gallstones or findings for acute cholecystitis. Normal caliber and course of the common bile duct. Pancreas: As  demonstrated on the CT scan there is fairly marked distention of the pancreatic duct in the tail region with an abrupt cut off in the body region. Maximum distention is 7 mm. Although not well demonstrated there does appear to be an infiltrating mass in the pancreatic body causing the pancreatic duct obstruction and marked atrophy of the pancreatic tail. The pancreatic head demonstrates normal signal intensity and no obvious mass. Difficult to assess the surrounding vascular structures on the MRI. No obvious direct vascular invasion on the CT scan. No obvious peripancreatic adenopathy. Spleen:  Elongated spleen but no overt splenomegaly. Adrenals/Urinary Tract: The adrenal glands and kidneys are grossly normal. No worrisome renal lesions or hydronephrosis. Stomach/Bowel: The stomach, duodenum, visualized small bowel and visualize colon are grossly normal despite breathing motion artifact. Vascular/Lymphatic: Advanced atherosclerotic calcifications involving the aorta and branch vessels without obvious aneurysm. No obvious mesenteric or retroperitoneal adenopathy. Other:  No ascites. Musculoskeletal: No significant bony findings. IMPRESSION: 1. Suspect a 3.5 cm infiltrating pancreatic body neoplasm causing obstruction of the main pancreatic duct and atrophy of the pancreatic tail. The pancreatic head appears normal. 2. Limited examination due to lack of IV contrast and breathing motion artifact. Difficult to assess the vascular structures for invasion. 3. Recommend endoscopic biopsy. A dedicated high-resolution pancreatic CT scan with contrast may be helpful for further evaluation. 4. No obvious metastatic disease. Electronically Signed   By: Marijo Sanes M.D.   On: 12/24/2018 15:07   MR 3D Recon At Scanner  Result Date: 12/24/2018 CLINICAL DATA:  Pancreatic ductal dilatation. 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 EXAM: MRI ABDOMEN WITHOUT CONTRAST  (INCLUDING MRCP) TECHNIQUE: Multiplanar  multisequence MR imaging of the abdomen was performed. Heavily T2-weighted images of the biliary and pancreatic ducts were obtained, and three-dimensional MRCP images were rendered by post processing. COMPARISON:  CT scan 12/21/2018 FINDINGS: Unfortunate, the examination is limited due to breathing motion artifact and lack of IV contrast. Lower chest: The lung bases are grossly clear. No obvious pulmonary nodules or pleural effusions. No pericardial effusion. Hiatal hernia noted. Hepatobiliary: No hepatic lesions or intrahepatic biliary dilatation. The gallbladder is unremarkable. No gallstones or findings for acute cholecystitis. Normal caliber and course of the common bile  duct. Pancreas: As demonstrated on the CT scan there is fairly marked distention of the pancreatic duct in the tail region with an abrupt cut off in the body region. Maximum distention is 7 mm. Although not well demonstrated there does appear to be an infiltrating mass in the pancreatic body causing the pancreatic duct obstruction and marked atrophy of the pancreatic tail. The pancreatic head demonstrates normal signal intensity and no obvious mass. Difficult to assess the surrounding vascular structures on the MRI. No obvious direct vascular invasion on the CT scan. No obvious peripancreatic adenopathy. Spleen:  Elongated spleen but no overt splenomegaly. Adrenals/Urinary Tract: The adrenal glands and kidneys are grossly normal. No worrisome renal lesions or hydronephrosis. Stomach/Bowel: The stomach, duodenum, visualized small bowel and visualize colon are grossly normal despite breathing motion artifact. Vascular/Lymphatic: Advanced atherosclerotic calcifications involving the aorta and branch vessels without obvious aneurysm. No obvious mesenteric or retroperitoneal adenopathy. Other:  No ascites. Musculoskeletal: No significant bony findings. IMPRESSION: 1. Suspect a 3.5 cm infiltrating pancreatic body neoplasm causing obstruction of the  main pancreatic duct and atrophy of the pancreatic tail. The pancreatic head appears normal. 2. Limited examination due to lack of IV contrast and breathing motion artifact. Difficult to assess the vascular structures for invasion. 3. Recommend endoscopic biopsy. A dedicated high-resolution pancreatic CT scan with contrast may be helpful for further evaluation. 4. No obvious metastatic disease. Electronically Signed   By: Marijo Sanes M.D.   On: 12/24/2018 15:07    Scheduled Meds: . apixaban  5 mg Oral BID  . atorvastatin  10 mg Oral QHS  . cloNIDine  0.1 mg Oral BH-qamhs   Followed by  . [START ON 12/26/2018] cloNIDine  0.1 mg Oral QAC breakfast  . clopidogrel  75 mg Oral Daily  . feeding supplement (ENSURE ENLIVE)  237 mL Oral Q24H  . fluticasone  2 spray Each Nare Daily  . LORazepam  0.5 mg Oral Daily  . metoprolol tartrate  12.5 mg Oral BID  . mometasone-formoterol  2 puff Inhalation BID  . oxyCODONE  10 mg Oral BID  . pantoprazole  40 mg Oral Daily  . senna  1 tablet Oral QHS  . umeclidinium bromide  1 puff Inhalation Daily    Continuous Infusions:    Time spent: 80mins I have personally reviewed and interpreted on  12/25/2018 daily labs, tele strips, imagings as discussed above under date review session and assessment and plans.  I reviewed all nursing notes, pharmacy notes, vitals, pertinent old records  I have discussed plan of care as described above with RN , patient 12/25/2018   Florencia Reasons MD, PhD, FACP  [facility] Pager [phone]. If 7PM-7AM, please contact night-coverage at [email or web address], password [facility] 12/25/2018, 9:11 AM  LOS: 3 days

## 2018-12-25 NOTE — Care Management Important Message (Signed)
Important Message  Patient Details  Name: Allison Vaughan MRN: [id number] Date of Birth: [birth year]   Medicare Important Message Given:        Orbie Pyo 12/25/2018, 2:18 PM

## 2018-12-26 DIAGNOSIS — I480 Paroxysmal atrial fibrillation: Secondary | ICD-10-CM

## 2018-12-26 LAB — BASIC METABOLIC PANEL
Anion gap: 10 (ref 5–15)
BUN: 14 mg/dL (ref 8–23)
CO2: 21 mmol/L — ABNORMAL LOW (ref 22–32)
Calcium: 8.6 mg/dL — ABNORMAL LOW (ref 8.9–10.3)
Chloride: 104 mmol/L (ref 98–111)
Creatinine, Ser: 0.9 mg/dL (ref 0.44–1.00)
GFR calc Af Amer: >60 mL/min (ref >60)
GFR calc non Af Amer: >60 mL/min (ref >60)
Glucose, Bld: 102 mg/dL — ABNORMAL HIGH (ref 70–99)
Potassium: 3.3 mmol/L — ABNORMAL LOW (ref 3.5–5.1)
Sodium: 135 mmol/L (ref 135–145)

## 2018-12-26 MED ORDER — SENNOSIDES-DOCUSATE SODIUM 8.6-50 MG PO TABS: 1.0000 | ORAL_TABLET | Freq: Every day | ORAL | 0 refills | Status: DC

## 2018-12-26 MED ORDER — METOPROLOL TARTRATE 25 MG PO TABS: 12.5000 mg | ORAL_TABLET | Freq: Two times a day (BID) | ORAL | 0 refills | Status: DC

## 2018-12-26 MED ORDER — POTASSIUM CHLORIDE CRYS ER 20 MEQ PO TBCR
40.0000 meq | EXTENDED_RELEASE_TABLET | Freq: Once | ORAL | Status: AC
  Administered 2018-12-26: 40 meq via ORAL
  Filled 2018-12-26: qty 2

## 2018-12-26 MED ORDER — UMECLIDINIUM BROMIDE 62.5 MCG/INH IN AEPB: 1.0000 | INHALATION_SPRAY | Freq: Every day | RESPIRATORY_TRACT | 0 refills | Status: DC

## 2018-12-26 MED ORDER — OMEPRAZOLE 20 MG PO CPDR: 20.0000 mg | DELAYED_RELEASE_CAPSULE | Freq: Every day | ORAL | 0 refills | Status: DC

## 2018-12-26 MED ORDER — FLUTICASONE PROPIONATE 50 MCG/ACT NA SUSP: 2.0000 | Freq: Every day | NASAL | 0 refills | Status: DC

## 2018-12-26 MED ORDER — FUROSEMIDE 20 MG PO TABS: 20.0000 mg | ORAL_TABLET | Freq: Every day | ORAL | 0 refills | Status: DC | PRN

## 2018-12-26 NOTE — Discharge Summary (Signed)
Discharge Summary  Allison Vaughan [id number] DOB: [birth year]  PCP: Allison Gravel, MD  Admit date: 12/21/2018 Discharge date: 12/26/2018  Time spent: 50mins, more than 50% time spent on coordination of care.  Recommendations for Outpatient Follow-up:  1. F/u with PCP within a week  for hospital discharge follow up, repeat cbc/bmp at follow up 2. F/u with GI Dr Allison Vaughan, EUS/FNA for pancreatic mass on 12/15 3. F/u with cardiology   Discharge Diagnoses:  Active Hospital Problems   Diagnosis Date Noted  . Prolonged QT interval 12/22/2018  . Constipation 12/22/2018  . Hypomagnesemia 12/22/2018  . Dilation of pancreatic duct 12/22/2018  . Acute encephalopathy   . Hypokalemia 04/30/2014  . Hyponatremia 04/30/2014  . Essential hypertension 04/30/2014  . Nausea & vomiting 04/30/2014    Resolved Hospital Problems  No resolved problems to display.    Discharge Condition: stable  Diet recommendation: heart healthy  Filed Weights   12/23/18 2059 12/25/18 0457 12/26/18 0500  Weight: 45 kg 44.8 kg 45.7 kg    History of present illness: (per admitting MD Dr Maudie Mercury) Allison Vaughan  is a 73 y.o. 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    In Ed,  T 98.4, P 83  R 18, Bp 173/96  Pox 100% on RA  CT abd/ pelvis IMPRESSION: 1. Large amount of stool at the level of the rectum. Correlate for symptoms of fecal impaction. 2. Persistent dilatation of the distal pancreatic duct with abrupt cutoff at the level of the pancreatic body. Further evaluation with a nonemergent outpatient contrast enhanced MRI is recommended. 3. Nonobstructive left nephrolithiasis. 4. Subacute anterior right rib fractures.  Na 131, K 2.8, Bun 9, Creatinine 0.93 Ast 16, Alt 9 Wbc 4.2, Hgb 13.7, Plt 271 Magnesium 1.7 Lipase 17 Urinalysis negative   Pt will be admitted  for w/up of hypokalemia, n/v, fecal impaction, and dilation of pancreatic duct   Hospital Course:  Active Problems:   Essential hypertension   Hypokalemia   Hyponatremia   Nausea & vomiting   Acute encephalopathy   Prolonged QT interval   Hypomagnesemia   Constipation   Dilation of pancreatic duct   Nausea /vomitingx3 days at home --CT scan on presentation "1. Large amount of stool at the level of the rectum. Correlate for symptoms of fecal impaction. 2. Persistent dilatation of the distal pancreatic duct with abrupt cutoff at the level of the pancreatic body. Further evaluation with a nonemergent outpatient contrast enhanced MRI is recommended. 3. Nonobstructive left nephrolithiasis. 4. Subacute anterior right rib fractures. Aortic Atherosclerosis (ICD10-I70.0)." -n/v Possibly due to fecal impaction, she received enema, no more n/v since arrival to the floor  -she tolerates regular diet   Persistent dilatation of the distal pancreatic duct with abrupt cutoff at the level of the pancreatic body.  -This was noted in last discharge summary in June 2020, she was not able to tolerate MRI in the hospital,  -Per chart review there are several no-shows with GI, cardiology and PCP in the past -She agreed to have MRCP done this time which showed "Suspect a 3.5 cm infiltrating pancreatic body neoplasm causing obstruction of the main pancreatic duct and atrophy of the pancreatic tail. The pancreatic head appears normal." -  [name] consulted, she is scheduled to undergo EUS/FNA on 12/15 by Dr Allison Vaughan,  - hold Eliquis and Plavix prior to the procedure. -Patient  is aware of the plan and agreed to proceed outpatient follow-up   Possible gastritis Stomach duodenum no acute findings on CT abdomen pelvis on presentation She is on PPI  Follow-up with [name] Dr. Paulita Vaughan   AKI on CKD 2 -Creatinine went up to 1.38 today, she denies urinary symptom,  UA/ bladder ultrasound  unremarkable, cr improved and normalized with hydration, encourage oral intake. -f/u with pcp   Hyponatremia sHe appears dehydrated clinically , Lasix discontinued on 12/9,  Sodium normalized with hydration and discontinue lasix. -Mag 2.4 - Phos 2.6 -f/u with pcp    hypokalemia  potassium 2.9 on presentation, normalized  QTC prolongation -Multifactorial including electrolyte abnormality  -QTc 766 on presentation , QTC has improved today 450 after correcting electrolyte abnormalities  -DC telemetry  Acute metabolic encephalopathy -Possibly related to withdrawal in the setting of fecal impaction and metabolic derangement, although per daughter patient does not have recent substance abuse, report patient got confused whenever she has electrolyte abnormality -She was agitated and irritable on presentation required safety sitter sitter, per chart review she was hospitalized in January 2019 due to encephalopathy and was intubated for airway protection, encephalopathy at that time was attributed to withdraw (Presumed benzodiazepine and opiate withdrawal.),  Per daughter that episode was likely from withdrawing.  Patient was in the process of taking of methadone at the time. -Encephalopathy has resolved ,  she has been alert and oriented x3, no confusion, pleasant and cooperative for the last 2 days -Today she told me, she did use heroin to snorts prior to coming to the hospital.  She told me she got it from a friend of her age when her daughter was not home.  Her daughter was not aware of it.   Chronic pain was on methadone in the past, she was taken off methadone 6-month ago substance abuse?, patient reports recent nasal heroin use, per daughter patient is confused, patient did not have any recent heroin use at all After encephalopathy resolved, patient again reports to me that she used nasal heroin prior to hospitalization , she reports her daughter was not aware of it Social worker  consulted, input appreciated F/u with pcp  History of paroxysmal A. Fib -EKG documented afib in 03/2018 -He is on Cardizem and Eliquis , per daughter she is taken off eliquis for about 6 months due to unclear evidence of afib, daughter is willing to restart Eliquis -Daughter also reports Cardizem caused edema, " every time she is off Cardizem her edema resolves" per daughter's report.  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  Physical therapy did not recommend home health  Several no-shows in the past per chart review, daughter reports it is difficult to get patient to agree to go to doctor's appointment at times , daughter states she is going to try to convince.patient to follow-up with her  doctors. Patient agrees to follow up with her doctors as well.   DVT Prophylaxis while in the hospital: On Eliquis  Code Status: Full  Family Communication: patient , daughter who is the main caregiver [name] over the phone [phone]   Disposition Plan: home    Consultants:  [facility]  Procedures:  None  Antibiotics:  None   Discharge Exam: BP (!) 174/75 (BP Location: Left Arm)   Pulse 64   Temp 98.1 F (36.7 C) (Oral)   Resp 18   Wt 45.7 kg   SpO2 98%   BMI 19.68 kg/m     General:  NAD, AAOx3,  very pleasant and cooperative  Cardiovascular: RRR  Respiratory: CTABL  Abdomen: Soft/ND/NT, positive BS  Musculoskeletal: No Edema  Neuro: alert, oriented x3, pleasant    Discharge Instructions You were cared for by a hospitalist during your hospital stay. If you have any questions about your discharge medications or the care you received while you were in the hospital after you are discharged, you can call the unit and asked to speak with the hospitalist on call if the hospitalist that took care of you is not available. Once you are discharged, your primary care physician will handle any further medical issues. Please note that NO REFILLS for any discharge medications will be authorized once you are discharged, as it is imperative that you return to your primary care physician (or establish a relationship with a primary care physician if you do not have one) for your aftercare needs so that they can reassess your need for medications and monitor your lab values.  Discharge Instructions    Diet - low sodium heart healthy   Complete by: As directed    Diet - low sodium heart healthy   Complete by: As directed    Increase activity slowly   Complete by: As directed    Increase activity slowly   Complete by: As directed      Allergies as of 12/26/2018   No Known Allergies     Medication List    STOP taking these medications   diltiazem 300 MG 24 hr capsule Commonly known as: TIAZAC   diltiazem 300 MG 24 hr capsule Commonly known as: Cardizem CD     TAKE these medications   albuterol 108 (90 Base) MCG/ACT inhaler Commonly known as: VENTOLIN HFA Inhale 2 puffs into the lungs every 6 (six) hours as needed for wheezing.   albuterol (2.5 MG/3ML) 0.083% nebulizer solution Commonly known as: PROVENTIL Take 3 mLs (2.5 mg total) by nebulization every 2 (two) hours as needed for wheezing.   atorvastatin 10 MG tablet Commonly known as: LIPITOR Take 1 tablet (10 mg total) by  mouth at bedtime.   DULoxetine HCl 40 MG Cpep Take 1 tablet by mouth daily.   feeding supplement (ENSURE ENLIVE) Liqd Take 237 mLs by mouth 2 (two) times daily between meals. What changed: when to take this   fluticasone 50 MCG/ACT nasal spray Commonly known as: FLONASE Place 2 sprays into both nostrils daily.   furosemide 20 MG tablet Commonly known as: LASIX Take 1 tablet (20 mg total) by mouth daily as needed. What changed:   when to take this  reasons to take this   ibuprofen 200 MG tablet Commonly known as: ADVIL Take 400 mg by mouth every 6 (six) hours as needed for moderate pain.   LORazepam 0.5 MG tablet Commonly known as: ATIVAN Take 0.5 mg by mouth daily.   metoprolol  tartrate 25 MG tablet Commonly known as: LOPRESSOR Take 0.5 tablets (12.5 mg total) by mouth 2 (two) times daily.   omeprazole 20 MG capsule Commonly known as: PriLOSEC Take 1 capsule (20 mg total) by mouth daily.   Oxycodone HCl 10 MG Tabs Take 10 mg by mouth 2 (two) times daily.   potassium chloride SA 20 MEQ tablet Commonly known as: KLOR-CON Take 20 mEq by mouth daily.   senna-docusate 8.6-50 MG tablet Commonly known as: Senokot-S Take 1 tablet by mouth at bedtime.   umeclidinium bromide 62.5 MCG/INH Aepb Commonly known as: INCRUSE ELLIPTA Inhale 1 puff into the lungs daily.      No Known Allergies Follow-up Information    Allison Gravel, MD Follow up in 1 week(s).   Specialty: Internal Medicine Why: hospital discharge follow up, repeat cbc/bmp at follow up. please bring in all your home meds, review all your home meds with your pcp. Contact information: [address] [place] [address] [phone]        [facility] Follow up.   Specialty: Cardiology Why: for outpatient cardiac monitoring, please call office if you donot hear from them in three business days Contact information: [address], [address] [place]       Thompson Grayer, MD Follow up on 02/11/2019.   Specialty: Cardiology Why: Please arrive 15 minutes early for your 8:30am follow-up visit.  Contact information: [address] [place]  [address] [phone]        Daune Perch, NP Follow up on 01/14/2019.   Specialty: Cardiology Why: Please arrive 15 minutes early for your 10:00am post-hospital follow-up.  Contact information: [address] [place] [address] [phone]        Arta Silence, MD. Schedule an appointment as soon as possible for a visit in 2 week(s).   Specialty: Gastroenterology Why: Pancreatic mass. Contact information: [address] [address] [phone]        [facility] Follow up on 12/29/2018.   Why: please do not eat or drink monday aftermidnight, please arrive at [facility] long endoscopy room Tuesday at 9:45am for procedure by Dr Allison Vaughan.            The results of significant diagnostics from this hospitalization (including imaging, microbiology, ancillary and laboratory) are listed below for reference.    Significant Diagnostic Studies: CT Abdomen Pelvis W Contrast  Result Date: 12/22/2018 CLINICAL DATA:  Lower abdominal pain in vomiting x1 week. EXAM: CT ABDOMEN AND PELVIS WITH CONTRAST TECHNIQUE: Multidetector CT imaging of the abdomen and pelvis was performed using the standard protocol following bolus administration of intravenous contrast. CONTRAST:  181mL OMNIPAQUE IOHEXOL 300 MG/ML  SOLN COMPARISON:  CT dated 08/20/2018 FINDINGS: Lower chest: The lung bases are clear. The heart size is normal. Hepatobiliary: The liver is normal. Normal gallbladder.There is no biliary ductal dilation. Pancreas: Again noted is dilatation of the distal pancreatic duct within abrupt cutoff at the level of the pancreatic body. Spleen: The spleen is again enlarged measuring approximately 12 cm craniocaudad.  Adrenals/Urinary Tract: --Adrenal glands: No adrenal hemorrhage. --Right kidney/ureter: No hydronephrosis or perinephric hematoma. --Left kidney/ureter: There are nonobstructing stones in the upper pole the left kidney. --Urinary bladder: Unremarkable. Stomach/Bowel: --Stomach/Duodenum: No hiatal hernia or other gastric abnormality. Normal duodenal course and caliber. --Small bowel: No dilatation or inflammation. --Colon: There is a large stool burden. There is a  large amount of stool at the level of the rectum. --Appendix: Not visualized. No right lower quadrant inflammation or free fluid. Vascular/Lymphatic: Atherosclerotic calcification is present within the non-aneurysmal abdominal aorta, without hemodynamically significant stenosis. --No retroperitoneal lymphadenopathy. --No mesenteric lymphadenopathy. --No pelvic or inguinal lymphadenopathy. Reproductive: Status post hysterectomy. No adnexal mass. Other: No ascites or free air. The abdominal wall is normal. Musculoskeletal. There are advanced degenerative changes throughout the visualized lumbar spine. There is a grade 1 anterolisthesis of L5 on S1 secondary to degenerative changes. There are subacute anterior rib fractures on the right. There are old healed bilateral rib fractures IMPRESSION: 1. Large amount of stool at the level of the rectum. Correlate for symptoms of fecal impaction. 2. Persistent dilatation of the distal pancreatic duct with abrupt cutoff at the level of the pancreatic body. Further evaluation with a nonemergent outpatient contrast enhanced MRI is recommended. 3. Nonobstructive left nephrolithiasis. 4. Subacute anterior right rib fractures. Aortic Atherosclerosis (ICD10-I70.0). Electronically Signed   By: Constance Holster M.D.   On: 12/22/2018 00:03   MR ABDOMEN MRCP WO CONTRAST  Result Date: 12/24/2018 CLINICAL DATA:  Pancreatic ductal dilatation. 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 EXAM: MRI ABDOMEN WITHOUT  CONTRAST  (INCLUDING MRCP) TECHNIQUE: Multiplanar multisequence MR imaging of the abdomen was performed. Heavily T2-weighted images of the biliary and pancreatic ducts were obtained, and three-dimensional MRCP images were rendered by post processing. COMPARISON:  CT scan 12/21/2018 FINDINGS: Unfortunate, the examination is limited due to breathing motion artifact and lack of IV contrast. Lower chest: The lung bases are grossly clear. No obvious pulmonary nodules or pleural effusions. No pericardial effusion. Hiatal hernia noted. Hepatobiliary: No hepatic lesions or intrahepatic biliary dilatation. The gallbladder is unremarkable. No gallstones or findings for acute cholecystitis. Normal caliber and course of the common bile duct. Pancreas: As demonstrated on the CT scan there is fairly marked distention of the pancreatic duct in the tail region with an abrupt cut off in the body region. Maximum distention is 7 mm. Although not well demonstrated there does appear to be an infiltrating mass in the pancreatic body causing the pancreatic duct obstruction and marked atrophy of the pancreatic tail. The pancreatic head demonstrates normal signal intensity and no obvious mass. Difficult to assess the surrounding vascular structures on the MRI. No obvious direct vascular invasion on the CT scan. No obvious peripancreatic adenopathy. Spleen:  Elongated spleen but no overt splenomegaly. Adrenals/Urinary Tract: The adrenal glands and kidneys are grossly normal. No worrisome renal lesions or hydronephrosis. Stomach/Bowel: The stomach, duodenum, visualized small bowel and visualize colon are grossly normal despite breathing motion artifact. Vascular/Lymphatic: Advanced atherosclerotic calcifications involving the aorta and branch vessels without obvious aneurysm. No obvious mesenteric or retroperitoneal adenopathy. Other:  No ascites. Musculoskeletal: No significant bony findings. IMPRESSION: 1. Suspect a 3.5 cm infiltrating  pancreatic body neoplasm causing obstruction of the main pancreatic duct and atrophy of the pancreatic tail. The pancreatic head appears normal. 2. Limited examination due to lack of IV contrast and breathing motion artifact. Difficult to assess the vascular structures for invasion. 3. Recommend endoscopic biopsy. A dedicated high-resolution pancreatic CT scan with contrast may be helpful for further evaluation. 4. No obvious metastatic disease. Electronically Signed   By: Marijo Sanes M.D.   On: 12/24/2018 15:07   MR 3D Recon At Scanner  Result Date: 12/24/2018 CLINICAL DATA:  Pancreatic ductal dilatation. 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 EXAM: MRI ABDOMEN WITHOUT CONTRAST  (INCLUDING MRCP) TECHNIQUE: Multiplanar multisequence MR imaging of the abdomen  was performed. Heavily T2-weighted images of the biliary and pancreatic ducts were obtained, and three-dimensional MRCP images were rendered by post processing. COMPARISON:  CT scan 12/21/2018 FINDINGS: Unfortunate, the examination is limited due to breathing motion artifact and lack of IV contrast. Lower chest: The lung bases are grossly clear. No obvious pulmonary nodules or pleural effusions. No pericardial effusion. Hiatal hernia noted. Hepatobiliary: No hepatic lesions or intrahepatic biliary dilatation. The gallbladder is unremarkable. No gallstones or findings for acute cholecystitis. Normal caliber and course of the common bile duct. Pancreas: As demonstrated on the CT scan there is fairly marked distention of the pancreatic duct in the tail region with an abrupt cut off in the body region. Maximum distention is 7 mm. Although not well demonstrated there does appear to be an infiltrating mass in the pancreatic body causing the pancreatic duct obstruction and marked atrophy of the pancreatic tail. The pancreatic head demonstrates normal signal intensity and no obvious mass. Difficult to assess the surrounding vascular structures on the  MRI. No obvious direct vascular invasion on the CT scan. No obvious peripancreatic adenopathy. Spleen:  Elongated spleen but no overt splenomegaly. Adrenals/Urinary Tract: The adrenal glands and kidneys are grossly normal. No worrisome renal lesions or hydronephrosis. Stomach/Bowel: The stomach, duodenum, visualized small bowel and visualize colon are grossly normal despite breathing motion artifact. Vascular/Lymphatic: Advanced atherosclerotic calcifications involving the aorta and branch vessels without obvious aneurysm. No obvious mesenteric or retroperitoneal adenopathy. Other:  No ascites. Musculoskeletal: No significant bony findings. IMPRESSION: 1. Suspect a 3.5 cm infiltrating pancreatic body neoplasm causing obstruction of the main pancreatic duct and atrophy of the pancreatic tail. The pancreatic head appears normal. 2. Limited examination due to lack of IV contrast and breathing motion artifact. Difficult to assess the vascular structures for invasion. 3. Recommend endoscopic biopsy. A dedicated high-resolution pancreatic CT scan with contrast may be helpful for further evaluation. 4. No obvious metastatic disease. Electronically Signed   By: Marijo Sanes M.D.   On: 12/24/2018 15:07   DG Chest Port 1 View  Result Date: 12/21/2018 CLINICAL DATA:  Nausea vomiting for 1 week EXAM: PORTABLE CHEST 1 VIEW COMPARISON:  08/20/2018 FINDINGS: Cardiac shadow is stable. Aortic calcifications are again seen and stable. The lungs are well aerated bilaterally. No focal infiltrate or sizable effusion is seen. No acute bony abnormality is noted. IMPRESSION: No active disease. Electronically Signed   By: Inez Catalina M.D.   On: 12/21/2018 18:44    Microbiology: Recent Results (from the past 240 hour(s))  Urine culture     Status: Abnormal   Collection Time: 12/21/18  6:54 PM   Specimen: Urine, Random  Result Value Ref Range Status   Specimen Description URINE, RANDOM  Final   Special Requests NONE  Final    Culture (A)  Final    <10,000 COLONIES/mL INSIGNIFICANT GROWTH Performed at [facility], [address] [place], [place] [address]    Report Status 12/22/2018 FINAL  Final  SARS CORONAVIRUS 2 (TAT 6-24 HRS) Nasopharyngeal Urine, Clean Catch     Status: None   Collection Time: 12/21/18  6:54 PM   Specimen: Urine, Clean Catch; Nasopharyngeal  Result Value Ref Range Status   SARS Coronavirus 2 NEGATIVE NEGATIVE Final    Comment: (NOTE) SARS-CoV-2 target nucleic acids are NOT DETECTED. The SARS-CoV-2 RNA is generally detectable in upper and lower respiratory specimens during the acute phase of infection. Negative results do not preclude SARS-CoV-2 infection, do not rule out co-infections with  other pathogens, and should not be used as the sole basis for treatment or other patient management decisions. Negative results must be combined with clinical observations, patient history, and epidemiological information. The expected result is Negative. Fact Sheet for Patients: SugarRoll.be Fact Sheet for Healthcare Providers: https://www.woods-mathews.com/ This test is not yet approved or cleared by the [place] FDA and  has been authorized for detection and/or diagnosis of SARS-CoV-2 by FDA under an Emergency Use Authorization (EUA). This EUA will remain  in effect (meaning this test can be used) for the duration of the COVID-19 declaration under Section 56 4(b)(1) of the Act, 21 U.S.C. section 360bbb-3(b)(1), unless the authorization is terminated or revoked sooner. Performed at [facility], [place] [address], [place], [place] [address]   SARS CORONAVIRUS 2 (TAT 6-24 HRS) Nasopharyngeal Nasopharyngeal Swab     Status: None   Collection Time: 12/25/18  4:32 PM   Specimen: Nasopharyngeal Swab  Result Value Ref Range Status   SARS Coronavirus 2 NEGATIVE NEGATIVE Final    Comment: (NOTE) SARS-CoV-2 target nucleic acids are NOT  DETECTED. The SARS-CoV-2 RNA is generally detectable in upper and lower respiratory specimens during the acute phase of infection. Negative results do not preclude SARS-CoV-2 infection, do not rule out co-infections with other pathogens, and should not be used as the sole basis for treatment or other patient management decisions. Negative results must be combined with clinical observations, patient history, and epidemiological information. The expected result is Negative. Fact Sheet for Patients: SugarRoll.be Fact Sheet for Healthcare Providers: https://www.woods-mathews.com/ This test is not yet approved or cleared by the [place] FDA and  has been authorized for detection and/or diagnosis of SARS-CoV-2 by FDA under an Emergency Use Authorization (EUA). This EUA will remain  in effect (meaning this test can be used) for the duration of the COVID-19 declaration under Section 56 4(b)(1) of the Act, 21 U.S.C. section 360bbb-3(b)(1), unless the authorization is terminated or revoked sooner. Performed at [facility], [place] [address], [place], [place] [employer] [id number]      Labs: Basic Metabolic Panel: Recent Labs  Lab 12/21/18 1657 12/21/18 1718 12/23/18 0401 12/23/18 1422 12/24/18 0355 12/25/18 0252 12/26/18 0501  NA 131*  --  129* 126* 128* 133* 135  K 2.8*  --  2.9* 2.9* 4.3 4.2 3.3*  CL 88*  --  90* 88* 94* 102 104  CO2 31   < > 25 25 23 23  21*  GLUCOSE 122*  --  98 100* 103* 100* 102*  BUN 9  --  15 14 16 23 14   CREATININE 0.93  --  1.04* 1.20* 1.08* 1.38* 0.90  CALCIUM 9.2   < > 8.0* 8.2* 8.0* 8.2* 8.6*  MG 1.7  --   --  1.7 1.7 2.4  --   PHOS  --   --   --   --  2.6  --   --    < > = values in this interval not displayed.   Liver Function Tests: Recent Labs  Lab 12/21/18 1657 12/22/18 0550 12/23/18 0401  AST 16 19 15   ALT 9 9 8   ALKPHOS 108 115 83  BILITOT 0.6 0.5 0.1*  PROT 6.5 6.9 5.1*  ALBUMIN 4.0 4.0 3.2*    Recent Labs  Lab 12/21/18 1657  LIPASE 17   No results for input(s): AMMONIA in the last 168 hours. CBC: Recent Labs  Lab 12/21/18 1657 12/21/18 1718 12/22/18 0550  WBC 4.2  --  10.0  NEUTROABS 2.7  --   --   HGB 13.7 13.3 15.0  HCT 38.6 39.0 41.7  MCV 82.1  --  82.7  PLT 271  --  343   Cardiac Enzymes: No results for input(s): CKTOTAL, CKMB, CKMBINDEX, TROPONINI in the last 168 hours. BNP: BNP (last 3 results) Recent Labs    03/25/18 2358 07/08/18 2345 08/20/18 0951  BNP 1,025.1* 147.3* 39.9    ProBNP (last 3 results) No results for input(s): PROBNP in the last 8760 hours.  CBG: No results for input(s): GLUCAP in the last 168 hours.     Signed:  Florencia Reasons MD, PhD, FACP  [facility] 12/26/2018, 10:23 AM

## 2018-12-26 NOTE — Plan of Care (Signed)
Patient up out of bed to ambulate to bathroom with standby assistance provided. Patient did not show any signs of activity intolerance.

## 2018-12-26 NOTE — Progress Notes (Signed)
Discharged instructions reviewed with patient. Patient verbalized understanding. All belongings collected from the room. IV removed without complication. All questions answered. Patient's daughter here to pick up.

## 2018-12-27 LAB — CANCER ANTIGEN 19-9: CA 19-9: 250 U/mL — ABNORMAL HIGH (ref 0–35)

## 2018-12-29 ENCOUNTER — Ambulatory Visit (HOSPITAL_COMMUNITY): Admission: RE | Admit: 2018-12-29 | Payer: Medicare HMO | Source: Home / Self Care | Admitting: Gastroenterology

## 2018-12-29 ENCOUNTER — Encounter (HOSPITAL_COMMUNITY): Payer: Self-pay | Admitting: Anesthesiology

## 2018-12-29 SURGERY — UPPER ESOPHAGEAL ENDOSCOPIC ULTRASOUND (EUS)
Anesthesia: Monitor Anesthesia Care

## 2018-12-31 DIAGNOSIS — F17210 Nicotine dependence, cigarettes, uncomplicated: Secondary | ICD-10-CM | POA: Diagnosis not present

## 2018-12-31 DIAGNOSIS — R0602 Shortness of breath: Secondary | ICD-10-CM | POA: Diagnosis not present

## 2018-12-31 DIAGNOSIS — M5442 Lumbago with sciatica, left side: Secondary | ICD-10-CM | POA: Diagnosis not present

## 2018-12-31 DIAGNOSIS — E559 Vitamin D deficiency, unspecified: Secondary | ICD-10-CM | POA: Diagnosis not present

## 2018-12-31 DIAGNOSIS — Z79899 Other long term (current) drug therapy: Secondary | ICD-10-CM | POA: Diagnosis not present

## 2018-12-31 DIAGNOSIS — M129 Arthropathy, unspecified: Secondary | ICD-10-CM | POA: Diagnosis not present

## 2018-12-31 DIAGNOSIS — Z131 Encounter for screening for diabetes mellitus: Secondary | ICD-10-CM | POA: Diagnosis not present

## 2018-12-31 DIAGNOSIS — Z1159 Encounter for screening for other viral diseases: Secondary | ICD-10-CM | POA: Diagnosis not present

## 2018-12-31 DIAGNOSIS — E7800 Pure hypercholesterolemia, unspecified: Secondary | ICD-10-CM | POA: Diagnosis not present

## 2018-12-31 DIAGNOSIS — Z0000 Encounter for general adult medical examination without abnormal findings: Secondary | ICD-10-CM | POA: Diagnosis not present

## 2018-12-31 DIAGNOSIS — M5441 Lumbago with sciatica, right side: Secondary | ICD-10-CM | POA: Diagnosis not present

## 2018-12-31 DIAGNOSIS — R5383 Other fatigue: Secondary | ICD-10-CM | POA: Diagnosis not present

## 2019-01-01 ENCOUNTER — Other Ambulatory Visit: Payer: Self-pay

## 2019-01-01 NOTE — Patient Outreach (Signed)
[facility]) Care Management  01/01/2019  DIMITRA MALAFRONTE 21-Mar-1945 ID:[id number]   EMMI- General Discharge RED ON EMMI ALERT Day # 4 Date:  12/31/2018 Red Alert Reason:  Unfilled prescriptions? Yes  Outreach attempt: spoke with daughter [name].  She reports that patient is doing fine since discharge.  Addressed red alert.  Daughter states that patient has all her medications that were ordered.  She states that patient Eliquis on hold for procedure with the cancer center.  Daughter denies any needs or future follow up.     Plan: RN CM will close case.    Jone Baseman, RN, MSN [facility] Care Management Care Management Coordinator Direct Line [phone] Toll Free: [phone]  Fax: [phone]

## 2019-01-03 DIAGNOSIS — J449 Chronic obstructive pulmonary disease, unspecified: Secondary | ICD-10-CM | POA: Diagnosis not present

## 2019-01-06 ENCOUNTER — Other Ambulatory Visit: Payer: Self-pay | Admitting: Gastroenterology

## 2019-01-13 DIAGNOSIS — F17210 Nicotine dependence, cigarettes, uncomplicated: Secondary | ICD-10-CM | POA: Diagnosis not present

## 2019-01-13 DIAGNOSIS — Z79899 Other long term (current) drug therapy: Secondary | ICD-10-CM | POA: Diagnosis not present

## 2019-01-13 DIAGNOSIS — I10 Essential (primary) hypertension: Secondary | ICD-10-CM | POA: Diagnosis not present

## 2019-01-13 DIAGNOSIS — M5441 Lumbago with sciatica, right side: Secondary | ICD-10-CM | POA: Diagnosis not present

## 2019-01-13 DIAGNOSIS — M5442 Lumbago with sciatica, left side: Secondary | ICD-10-CM | POA: Diagnosis not present

## 2019-01-13 DIAGNOSIS — G8929 Other chronic pain: Secondary | ICD-10-CM | POA: Diagnosis not present

## 2019-01-14 ENCOUNTER — Ambulatory Visit: Payer: Medicare HMO | Admitting: Cardiology

## 2019-01-14 NOTE — Progress Notes (Deleted)
Cardiology Office Note:    Date:  01/14/2019   ID:  Allison Vaughan, DOB 1945-06-21, MRN ID:[id number]  PCP:  [name], MD  Cardiologist:  No primary care provider on file.  Referring MD: [name], MD   No chief complaint on file. ***  History of Present Illness:    Allison Vaughan is a 73 y.o. female with a past medical history significant for hypertension, hyperlipidemia, CAD, paroxysmal atrial fibrillation, edema, tobacco dependence, COPD, irritable bowel syndrome and heroin use who is here for hospital follow-up and is new to cardiology.  The patient was seen once in consultation during her hospital visit in 02/2017 for atrial fibrillation.  She was found to have A. fib with RVR versus MAT.  With the short duration of arrhythmia it was felt that this was likely MAT and she was not started on anticoagulation at the time.  Echocardiogram showed normal LV systolic function.  It appears that at some point the patient was put on Cardizem and Eliquis for paroxysmal atrial fibrillation.  The patient was admitted to the hospital 12/21/2018-12/26/2018 for work-up of hypokalemia, nausea and vomiting, fecal impaction and dilation of pancreatic duct.  Review of hospital discharge summary indicates that patient had been off of Eliquis for approximately 6 months prior to hospitalization due to unclear evidence of A. fib.  It was also noted that Cardizem causes edema for her.  Apparently the patient continued in sinus rhythm with intermittent sinus tachycardia on telemetry while hospitalized.  Cardizem was discontinued and she was placed on a beta-blocker.  An outpatient monitor was ordered to assess for further atrial fibrillation and possibly discussed need for long-term anticoagulation.  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  She had no chest pain during recent admission.  The patient is on Plavix per PCP which is on hold for GI procedures.  She is also on  Lipitor 10 mg.  Cardiac studies   Echocardiogram 07/09/2018 IMPRESSIONS   1. The left ventricle has normal systolic function with an ejection fraction of 60-65%. The cavity size was normal. Left ventricular diastolic Doppler parameters are indeterminate.  2. The right ventricle has normal systolic function. The cavity was normal. There is no increase in right ventricular wall thickness.  3. The mitral valve is grossly normal. Mild thickening of the mitral valve leaflet.  4. The aortic valve is tricuspid. Mild thickening of the aortic valve.  5. The interatrial septum was not assessed.   Past Medical History:  Diagnosis Date  . Accidental drug overdose   . Acute encephalopathy   . Acute on chronic respiratory failure with hypoxia ([place]) 03/01/2017  . Acute renal failure ([place]) 04/16/2014  . AKI (acute kidney injury) ([place]) 04/30/2014  . Altered mental status 06/27/2012  . Anxiety   . Atrial fibrillation ([place])   . CAD (coronary artery disease)   . CAP (community acquired pneumonia) 04/18/2014  . Cardiomyopathy ([place])   . Chronic hypoxemic respiratory failure ([place]) 06/26/2012  . Chronic pain   . COPD exacerbation ([place]) 06/26/2012  . COPD with acute exacerbation ([place]) 03/01/2017  . DEPRESSIVE DISORDER, NOS 03/13/2006   Qualifier: Diagnosis of  By: Damita Dunnings MD, Phillip Heal    . Edema, peripheral   . Essential hypertension 04/30/2014  . Fall at home, initial encounter 03/01/2017  . GASTROESOPHAGEAL REFLUX, NO ESOPHAGITIS 03/13/2006   Qualifier: Diagnosis of  By: Damita Dunnings MD, Phillip Heal    . HYPERCHOLESTEROLEMIA 03/13/2006   Qualifier: Diagnosis of  By: Damita Dunnings MD, Phillip Heal    . Hyperlipidemia   . HYPERTENSION, BENIGN SYSTEMIC 03/13/2006   Qualifier: Diagnosis of  By: Damita Dunnings MD, Phillip Heal    . Hypokalemia 04/30/2014  . Hyponatremia 04/30/2014  . Hypotension 04/30/2014  . Hypoxemia 06/26/2012  . Hypoxia 03/26/2018  . Irritable bowel syndrome 03/13/2006   Qualifier: Diagnosis of  By: Damita Dunnings MD, Phillip Heal    . Lung nodule  04/16/2014  . MENOPAUSAL SYNDROME 03/13/2006   Qualifier: Diagnosis of  By: Damita Dunnings MD, Phillip Heal    . Mood disorder [facility]) 03/13/2006   Qualifier: Diagnosis of  By: Damita Dunnings MD, Phillip Heal    . 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 Nausea 04/30/2014  . Nausea vomiting and diarrhea 04/16/2014  . OSTEOARTHRITIS, MULTI SITES 03/13/2006   Qualifier: Diagnosis of  By: Damita Dunnings MD, Phillip Heal    . Pulmonary nodule 04/30/2014  . Respiratory failure ([place]) 01/22/2017  . TOBACCO DEPENDENCE 03/13/2006   Qualifier: Diagnosis of  By: Damita Dunnings MD, Phillip Heal    . Weight loss     Past Surgical History:  Procedure Laterality Date  . EYE SURGERY    . ROTATOR CUFF REPAIR Right 2012    Current Medications: No outpatient medications have been marked as taking for the 01/14/19 encounter (Appointment) with Daune Perch, NP.     Allergies:   Patient has no known allergies.   Social History   Socioeconomic History  . Marital status: Single    Spouse name: Not on file  . Number of children: Not on file  . Years of education: Not on file  . Highest education level: Not on file  Occupational History  . Not on file  Tobacco Use  . Smoking status: Current Every Day Smoker    Packs/day: 1.00    Types: Cigarettes  . Smokeless tobacco: Never Used  . Tobacco comment: states she is trying to quit  Substance and Sexual Activity  . Alcohol use: No    Comment: not in a long time  . Drug use: No  . Sexual activity: Not on file  Other Topics Concern  . Not on file  Social History Narrative  . Not on file   Social Determinants of Health   Financial Resource Strain:   . Difficulty of Paying Living Expenses: Not on file  Food Insecurity:   . Worried About Charity fundraiser in the Last Year: Not on file  . Ran Out of Food in the Last Year: Not on file  Transportation Needs:   . Lack of Transportation (Medical): Not on file  . Lack of Transportation (Non-Medical): Not on file  Physical Activity:   . Days of Exercise per  Week: Not on file  . Minutes of Exercise per Session: Not on file  Stress:   . Feeling of Stress : Not on file  Social Connections:   . Frequency of Communication with Friends and Family: Not on file  . Frequency of Social Gatherings with Friends and Family: Not on file  . Attends Religious Services: Not on file  . Active Member of Clubs or Organizations: Not on file  . Attends Archivist Meetings: Not on file  . Marital Status: Not on file     Family History: The patient's ***family history includes CAD in her father; Healthy in her daughter, daughter, and son; Lung cancer in her brother. ROS:   Please see the history of present illness.    *** All other systems reviewed and are negative.  EKG:  EKG is *** ordered today.  The ekg ordered today demonstrates ***  Recent Labs: 07/09/2018: TSH 1.661 08/20/2018: B Natriuretic Peptide 39.9 12/22/2018: Hemoglobin 15.0; Platelets 343 12/23/2018: ALT 8 12/25/2018: Magnesium 2.4 12/26/2018: BUN 14; Creatinine, Ser 0.90; Potassium 3.3; Sodium 135   Recent Lipid Panel    Component Value Date/Time   TRIG 118 01/22/2017 2258    Physical Exam:    VS:  There were no vitals taken for this visit.    Wt Readings from Last 6 Encounters:  12/26/18 100 lb 12 oz (45.7 kg)  08/20/18 98 lb (44.5 kg)  07/09/18 98 lb 8.7 oz (44.7 kg)  04/08/18 93 lb 7.6 oz (42.4 kg)  03/27/18 93 lb 7.6 oz (42.4 kg)  07/12/17 100 lb (45.4 kg)     Physical Exam***   ASSESSMENT:    No diagnosis found. PLAN:    In order of problems listed above:  Possibility of atrial fibrillation -Monitor -Cardizem caused swelling in the past, now on metoprolol 12.5 mg twice daily  ?  Leg swelling -Had been attributed to diltiazem which is now switched to metoprolol.  Patient was started on Lasix 20 mg daily  Hyperlipidemia -On Lipitor 10 mg daily, followed by primary care     Medication Adjustments/Labs and Tests Ordered: Current medicines are  reviewed at length with the patient today.  Concerns regarding medicines are outlined above. Labs and tests ordered and medication changes are outlined in the patient instructions below:  There are no Patient Instructions on file for this visit.   Signed, Daune Perch, NP  01/14/2019 4:27 AM    [facility]

## 2019-01-20 ENCOUNTER — Telehealth: Payer: Self-pay | Admitting: Internal Medicine

## 2019-01-20 NOTE — Telephone Encounter (Signed)
   Daughter will need to come with the patient to her appt due to her mother having some dementia

## 2019-01-27 NOTE — Progress Notes (Deleted)
Cardiology Office Note:    Date:  01/27/2019   ID:  Allison Vaughan, DOB 25-May-1945, MRN [id number]  PCP:  Allison Gravel, MD  Cardiologist:  No primary care provider on file. ?Hilty in 2019 in hosp  Referring MD: Allison Gravel, MD   No chief complaint on file. ***  History of Present Illness:    Allison Vaughan is a 74 y.o. female with a past medical history significant for hypertension, hyperlipidemia, CAD, possible paroxysmal atrial fibrillation vs MAT, edema, tobacco dependence, COPD, irritable bowel syndrome and heroin use who is here for hospital follow-up and is new to cardiology.  The patient was seen once in consultation during her hospital visit in 02/2017 for atrial fibrillation.  She was found to have A. fib with RVR versus MAT.  With the short duration of arrhythmia it was felt that this was likely MAT and she was not started on anticoagulation at the time.  Echocardiogram showed normal LV systolic function.  It appears that at some point the patient was put on Cardizem and Eliquis for paroxysmal atrial fibrillation.  The patient was admitted to the hospital 12/21/2018-12/26/2018 for work-up of hypokalemia, nausea and vomiting, fecal impaction and dilation of pancreatic duct.  Review of hospital discharge summary indicates that patient had been off of Eliquis for approximately 6 months prior to hospitalization due to unclear evidence of A. fib.  It was also noted that Cardizem causes edema for her.  Apparently the patient continued in sinus rhythm with intermittent sinus tachycardia on telemetry while hospitalized.  Cardizem was discontinued and she was placed on a beta-blocker.  An outpatient monitor was ordered to assess for further atrial fibrillation and possibly discussed need for long-term anticoagulation.  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  She had no chest pain during recent admission.  The patient is on Plavix per PCP which is on  hold for GI procedures.  She is also on Lipitor 10 mg.     BMet for hypokalemia   Cardiac studies   Echocardiogram 07/09/2018 IMPRESSIONS  1. The left ventricle has normal systolic function with an ejection fraction of 60-65%. The cavity size was normal. Left ventricular diastolic Doppler parameters are indeterminate. 2. The right ventricle has normal systolic function. The cavity was normal. There is no increase in right ventricular wall thickness. 3. The mitral valve is grossly normal. Mild thickening of the mitral valve leaflet. 4. The aortic valve is tricuspid. Mild thickening of the aortic valve. 5. The interatrial septum was not assessed.    Past Medical History:  Diagnosis Date  . Accidental drug overdose   . Acute encephalopathy   . Acute on chronic respiratory failure with hypoxia ([place]) 03/01/2017  . Acute renal failure ([place]) 04/16/2014  . AKI (acute kidney injury) ([place]) 04/30/2014  . Altered mental status 06/27/2012  . Anxiety   . Atrial fibrillation ([place])   . CAD (coronary artery disease)   . CAP (community acquired pneumonia) 04/18/2014  . Cardiomyopathy ([place])   . Chronic hypoxemic respiratory failure ([place]) 06/26/2012  . Chronic pain   . COPD exacerbation ([place]) 06/26/2012  . COPD with acute exacerbation ([place]) 03/01/2017  . DEPRESSIVE DISORDER, NOS 03/13/2006   Qualifier: Diagnosis of  By: Damita Dunnings MD, Phillip Heal    . Edema, peripheral   . Essential hypertension 04/30/2014  . Fall at home, initial encounter 03/01/2017  . GASTROESOPHAGEAL REFLUX, NO ESOPHAGITIS 03/13/2006   Qualifier: Diagnosis of  By: Damita Dunnings MD, Phillip Heal    .  HYPERCHOLESTEROLEMIA 03/13/2006   Qualifier: Diagnosis of  By: Damita Dunnings MD, Phillip Heal    . Hyperlipidemia   . HYPERTENSION, BENIGN SYSTEMIC 03/13/2006   Qualifier: Diagnosis of  By: Damita Dunnings MD, Phillip Heal    . Hypokalemia 04/30/2014  . Hyponatremia 04/30/2014  . Hypotension 04/30/2014  . Hypoxemia 06/26/2012  . Hypoxia 03/26/2018  . Irritable bowel syndrome  03/13/2006   Qualifier: Diagnosis of  By: Damita Dunnings MD, Phillip Heal    . Lung nodule 04/16/2014  . MENOPAUSAL SYNDROME 03/13/2006   Qualifier: Diagnosis of  By: Damita Dunnings MD, Phillip Heal    . Mood disorder [facility]) 03/13/2006   Qualifier: Diagnosis of  By: Damita Dunnings MD, Phillip Heal    . 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 Nausea 04/30/2014  . Nausea vomiting and diarrhea 04/16/2014  . OSTEOARTHRITIS, MULTI SITES 03/13/2006   Qualifier: Diagnosis of  By: Damita Dunnings MD, Phillip Heal    . Pulmonary nodule 04/30/2014  . Respiratory failure ([place]) 01/22/2017  . TOBACCO DEPENDENCE 03/13/2006   Qualifier: Diagnosis of  By: Damita Dunnings MD, Phillip Heal    . Weight loss     Past Surgical History:  Procedure Laterality Date  . EYE SURGERY    . ROTATOR CUFF REPAIR Right 2012    Current Medications: No outpatient medications have been marked as taking for the 01/28/19 encounter (Appointment) with Daune Perch, NP.     Allergies:   Patient has no known allergies.   Social History   Socioeconomic History  . Marital status: Single    Spouse name: Not on file  . Number of children: Not on file  . Years of education: Not on file  . Highest education level: Not on file  Occupational History  . Not on file  Tobacco Use  . Smoking status: Current Every Day Smoker    Packs/day: 1.00    Types: Cigarettes  . Smokeless tobacco: Never Used  . Tobacco comment: states she is trying to quit  Substance and Sexual Activity  . Alcohol use: No    Comment: not in a long time  . Drug use: No  . Sexual activity: Not on file  Other Topics Concern  . Not on file  Social History Narrative  . Not on file   Social Determinants of Health   Financial Resource Strain:   . Difficulty of Paying Living Expenses: Not on file  Food Insecurity:   . Worried About Charity fundraiser in the Last Year: Not on file  . Ran Out of Food in the Last Year: Not on file  Transportation Needs:   . Lack of Transportation (Medical): Not on file  . Lack of  Transportation (Non-Medical): Not on file  Physical Activity:   . Days of Exercise per Week: Not on file  . Minutes of Exercise per Session: Not on file  Stress:   . Feeling of Stress : Not on file  Social Connections:   . Frequency of Communication with Friends and Family: Not on file  . Frequency of Social Gatherings with Friends and Family: Not on file  . Attends Religious Services: Not on file  . Active Member of Clubs or Organizations: Not on file  . Attends Archivist Meetings: Not on file  . Marital Status: Not on file     Family History: The patient's family history includes CAD in her father; Healthy in her daughter, daughter, and son; Lung cancer in her brother. ROS:   Please see the history of present illness.     All  other systems reviewed and are negative.   EKG:  EKG is ordered today.  The ekg ordered today demonstrates ***  Recent Labs: 07/09/2018: TSH 1.661 08/20/2018: B Natriuretic Peptide 39.9 12/22/2018: Hemoglobin 15.0; Platelets 343 12/23/2018: ALT 8 12/25/2018: Magnesium 2.4 12/26/2018: BUN 14; Creatinine, Ser 0.90; Potassium 3.3; Sodium 135   Recent Lipid Panel    Component Value Date/Time   TRIG 118 01/22/2017 2258    Physical Exam:    VS:  There were no vitals taken for this visit.    Wt Readings from Last 6 Encounters:  12/26/18 100 lb 12 oz (45.7 kg)  08/20/18 98 lb (44.5 kg)  07/09/18 98 lb 8.7 oz (44.7 kg)  04/08/18 93 lb 7.6 oz (42.4 kg)  03/27/18 93 lb 7.6 oz (42.4 kg)  07/12/17 100 lb (45.4 kg)     Physical Exam***   ASSESSMENT:    No diagnosis found. PLAN:    In order of problems listed above:   Possibility of atrial fibrillation -Monitor -Cardizem caused swelling in the past, now on metoprolol 12.5 mg twice daily  ?  Leg swelling -Had been attributed to diltiazem which is now switched to metoprolol.  Patient was started on Lasix 20 mg daily and is on K-Dur 20 mEq daily. -She has had normal renal function.  She  had low potassium while in the hospital. -Follow-up metabolic panel.  Hyperlipidemia -On Lipitor 10 mg daily, followed by primary care    Medication Adjustments/Labs and Tests Ordered: Current medicines are reviewed at length with the patient today.  Concerns regarding medicines are outlined above. Labs and tests ordered and medication changes are outlined in the patient instructions below:  There are no Patient Instructions on file for this visit.   Signed, Daune Perch, NP  01/27/2019 4:55 PM    [facility]

## 2019-01-28 ENCOUNTER — Ambulatory Visit: Payer: Medicare HMO | Admitting: Cardiology

## 2019-01-29 ENCOUNTER — Telehealth: Payer: Self-pay | Admitting: Internal Medicine

## 2019-01-29 NOTE — Telephone Encounter (Signed)
I left message for the pt to not to take her Eliquis Monday. I did not see a DPR though as it is 5:26 pm and pt's appt is Monday 02/01/19 @ 2 pm with Roby Lofts, PAC. Please call the office Monday morning if you have any questions.

## 2019-01-29 NOTE — Telephone Encounter (Signed)
Patient with diagnosis of afib on Eliquis for anticoagulation.    Procedure:  eus with fna Date of procedure: 02/03/2019  CHADS2-VASc score of  5 (HTN, AGE, CAD, AGE, female)  CrCl 38 ml/min  Per office protocol, patient can hold Eliquis for 2 days prior to procedure.

## 2019-01-29 NOTE — Telephone Encounter (Signed)
   Primary Cardiologist: Seen by Dr. Debara Pickett in the hospital in 2019  Chart reviewed as part of pre-operative protocol coverage. Because of Allison Vaughan [id number] past medical history and time since last visit, she will require a follow-up visit in order to better assess preoperative cardiovascular risk.  Pre-op covering staff: - Please schedule appointment and call patient to inform them. There is an opening on my scheduled 02/01/19 if that works for her, if not, feel free to cast the net to all APPs.  - Please contact requesting surgeon's office via preferred method (i.e, phone, fax) to inform them of need for appointment prior to surgery.  I will route to pharmacy for recommendations for holding eliquis prior to EUS with biopsy.   Abigail Butts, PA-C  01/29/2019, 3:56 PM

## 2019-01-29 NOTE — Telephone Encounter (Signed)
   [facility] Pre-operative Risk Assessment    Request for surgical clearance:  1. What type of surgery is being performed? eus with fna  2. When is this surgery scheduled? 1/20  3. What type of clearance is required (medical clearance vs. Pharmacy clearance to hold med vs. Both)? both  4. Are there any medications that need to be held prior to surgery and how long? hold Eliquis 2 days prior  5. Practice name and name of physician performing surgery? [facility], Dr. Paulita Vaughan  6. What is your office phone number: [phone]   7.   What is your office fax number: [phone] Put to Allison Vaughan's attention  8.   Anesthesia type (None, local, MAC, general) ? MAC   Allison Vaughan 01/29/2019, 2:07 PM  _________________________________________________________________   (provider comments below)

## 2019-01-29 NOTE — Telephone Encounter (Signed)
Per pre op provider Roby Lofts, [facility] pt will need appt. Per PAC to schedule pt on her schedule 02/01/19 @ 2 pm. Pt and her daughter are agreeable to plan of care and are aware to keep appt for pre op clearance. Pt and her daughter are to wear their mask. Aware the appt is at our [facility] office, which they are aware where this is. I will route clearance to PA for upcoming appt.

## 2019-01-31 NOTE — Progress Notes (Signed)
Cardiology Office Note   Date:  02/01/2019   ID:  Allison Vaughan, DOB [birth year], MRN ID:[id number]  PCP:  Sandi Mariscal, MD  Cardiologist: Seen by Dr. Debara Pickett during admission 02/2017 EP: None  Chief Complaint  Patient presents with  . Pre-op Exam      History of Present Illness: Allison Vaughan is a 74 y.o. female with a PMH of CAD (noted on CT scans),  chronic combined CHF (EF 4-45% 02/2017, improved to 60-65% 06/2018)  paroxysmal atrial fibrillation, HTN, HLD, COPD, anxiety and depression, opiate abuse, and recent finding of a suspected pancreatic neoplasm who presents for preoperative assessment.  She was last evaluated by cardiology during an admission to the hospital 02/2017 for the evaluation of possible new onset atrial fibrillation vs MAT. Decision was made to defer anticoagulation at that time unless clear atrial fibrillation reoccurred. She has had several admissions to [facility] since that time and confirmed paroxysmal atrial fibrillation, for which she was started on eliquis for stroke ppx. She has failed to follow-up with cardiology in the interim.   During her admission 12/2018, she underwent an MRCP which revealed a 3.5cm infiltrating pancreatic body neoplasm. She was scheduled to undergo EUS/FNA 12/29/2018, however this did not occur and was rescheduled for 02/03/19. Cardiology was asked to evaluate for preop assessment, for which this visit was scheduled.   It does not appear that she's had a formal ischemic evaluation, though has been noted to have coronary artery calcifications on prior CT scans. She had an echo 02/2017 which showed EF 40-45% with diffuse hypokinesis, mild LAE, and mild TR. Echo 06/2018 showed EF 60-65%, indeterminate LV diastolic function, and no significant valvular abnormalities.   She presents today with her daughter. She reports occasional sharp chest pains which come on with activity and when she gets upset. These episodes are associated with SOB,  typically last a few minutes, and resolve spontaneously. She is limited in mobility by chronic SOB due to underlying COPD. She cannot complete 4 METs without SOB. She reports occasional palpitations. She had trouble with LE edema which resolved with stopping diltiazem. She denies dizziness, lightheadedness, syncope, recent falls, hematuria, hematochezia, or melena. ROS notable for night sweats and weight loss. She reports having a biopsy of a pancreatic mass several years ago which was benign. She is anticipating another biopsy of her pancreas on Wednesday.    Past Medical History:  Diagnosis Date  . Accidental drug overdose   . Acute encephalopathy   . Acute on chronic respiratory failure with hypoxia ([place]) 03/01/2017  . Acute renal failure ([place]) 04/16/2014  . AKI (acute kidney injury) ([place]) 04/30/2014  . Altered mental status 06/27/2012  . Anxiety   . Atrial fibrillation ([place])   . CAD (coronary artery disease)   . CAP (community acquired pneumonia) 04/18/2014  . Cardiomyopathy ([place])   . Chronic hypoxemic respiratory failure ([place]) 06/26/2012  . Chronic pain   . COPD exacerbation ([place]) 06/26/2012  . COPD with acute exacerbation ([place]) 03/01/2017  . DEPRESSIVE DISORDER, NOS 03/13/2006   Qualifier: Diagnosis of  By: Damita Dunnings MD, Phillip Heal    . Edema, peripheral   . Essential hypertension 04/30/2014  . Fall at home, initial encounter 03/01/2017  . GASTROESOPHAGEAL REFLUX, NO ESOPHAGITIS 03/13/2006   Qualifier: Diagnosis of  By: Damita Dunnings MD, Phillip Heal    . HYPERCHOLESTEROLEMIA 03/13/2006   Qualifier: Diagnosis of  By: Damita Dunnings MD, Phillip Heal    . Hyperlipidemia   . HYPERTENSION, BENIGN SYSTEMIC 03/13/2006  Qualifier: Diagnosis of  By: Damita Dunnings MD, Phillip Heal    . Hypokalemia 04/30/2014  . Hyponatremia 04/30/2014  . Hypotension 04/30/2014  . Hypoxemia 06/26/2012  . Hypoxia 03/26/2018  . Irritable bowel syndrome 03/13/2006   Qualifier: Diagnosis of  By: Damita Dunnings MD, Phillip Heal    . Lung nodule 04/16/2014  . MENOPAUSAL SYNDROME  03/13/2006   Qualifier: Diagnosis of  By: Damita Dunnings MD, Phillip Heal    . Mood disorder [facility]) 03/13/2006   Qualifier: Diagnosis of  By: Damita Dunnings MD, Phillip Heal    . 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 Nausea 04/30/2014  . Nausea vomiting and diarrhea 04/16/2014  . OSTEOARTHRITIS, MULTI SITES 03/13/2006   Qualifier: Diagnosis of  By: Damita Dunnings MD, Phillip Heal    . Pulmonary nodule 04/30/2014  . Respiratory failure ([place]) 01/22/2017  . TOBACCO DEPENDENCE 03/13/2006   Qualifier: Diagnosis of  By: Damita Dunnings MD, Phillip Heal    . Weight loss     Past Surgical History:  Procedure Laterality Date  . EYE SURGERY    . ROTATOR CUFF REPAIR Right 2012     Current Outpatient Medications  Medication Sig Dispense Refill  . albuterol (PROVENTIL HFA;VENTOLIN HFA) 108 (90 BASE) MCG/ACT inhaler Inhale 2 puffs into the lungs every 6 (six) hours as needed for wheezing. 1 Inhaler 2  . albuterol (PROVENTIL) (2.5 MG/3ML) 0.083% nebulizer solution Take 3 mLs (2.5 mg total) by nebulization every 2 (two) hours as needed for wheezing. 75 mL 12  . Aspirin-Salicylamide-Caffeine (BC HEADACHE PO) Take 1 packet by mouth daily as needed (headaches).    Marland Kitchen atorvastatin (LIPITOR) 10 MG tablet Take 1 tablet (10 mg total) by mouth at bedtime. 30 tablet 0  . FLUoxetine (PROZAC) 40 MG capsule Take 40 mg by mouth daily.    . fluticasone (FLONASE) 50 MCG/ACT nasal spray Place 2 sprays into both nostrils daily. 16 g 0  . ibuprofen (ADVIL) 200 MG tablet Take 400 mg by mouth every 6 (six) hours as needed for moderate pain.    Marland Kitchen LORazepam (ATIVAN) 0.5 MG tablet Take 0.5 mg by mouth at bedtime.     . Nutritional Supplements (ENSURE ENLIVE PO) Take 237 mLs by mouth every other day.    . Oxycodone HCl 10 MG TABS Take 10 mg by mouth 4 (four) times daily.     . potassium chloride SA (K-DUR) 20 MEQ tablet Take 20 mEq by mouth daily.     Marland Kitchen senna-docusate (SENOKOT-S) 8.6-50 MG tablet Take 1 tablet by mouth at bedtime. 30 tablet 0  . umeclidinium bromide (INCRUSE ELLIPTA)  62.5 MCG/INH AEPB Inhale 1 puff into the lungs daily. 30 each 0  . apixaban (ELIQUIS) 5 MG TABS tablet Take 1 tablet (5 mg total) by mouth 2 (two) times daily. 180 tablet 3  . losartan (COZAAR) 25 MG tablet Take 1 tablet (25 mg total) by mouth daily. 90 tablet 3  . metoprolol succinate (TOPROL-XL) 25 MG 24 hr tablet Take 2 tablets (50 mg total) by mouth daily. Take with or immediately following a meal. 90 tablet 3  . nitroGLYCERIN (NITROSTAT) 0.4 MG SL tablet Place 1 tablet (0.4 mg total) under the tongue every 5 (five) minutes as needed for chest pain. 90 tablet 3   No current facility-administered medications for this visit.    Allergies:   Patient has no known allergies.    Social History:  The patient  reports that she has been smoking cigarettes. She has been smoking about 1.00 pack per day. She has never used smokeless  tobacco. She reports that she does not drink alcohol or use drugs.   Family History:  The patient's family history includes CAD in her father; Healthy in her daughter, daughter, and son; Lung cancer in her brother.    ROS:  Please see the history of present illness.   Otherwise, review of systems are positive for none.   All other systems are reviewed and negative.    PHYSICAL EXAM: VS:  BP (!) 188/85   Pulse 67   Ht 5\' 1"  (1.549 m)   Wt 104 lb (47.2 kg)   SpO2 99%   BMI 19.65 kg/m  , BMI Body mass index is 19.65 kg/m. GEN: Thin chronically ill appearing female, in no acute distress HEENT: sclera anicteric Neck: no JVD, carotid bruits, or masses Cardiac: RRR; no murmurs, rubs, or gallops, no edema  Respiratory:  Diffuse expiratory wheeze, no rales or rhonchi, normal work of breathing GI: soft, nontender, nondistended, + BS MS: no deformity or atrophy Skin: warm and dry, no rash Neuro:  Strength and sensation are intact Psych: euthymic mood, full affect   EKG:  EKG is ordered today. The ekg ordered today demonstrates NSR, rate 64 bpm, no STE/D, no  TWI   Recent Labs: 07/09/2018: TSH 1.661 08/20/2018: B Natriuretic Peptide 39.9 12/22/2018: Hemoglobin 15.0; Platelets 343 12/23/2018: ALT 8 12/25/2018: Magnesium 2.4 12/26/2018: BUN 14; Creatinine, Ser 0.90; Potassium 3.3; Sodium 135    Lipid Panel    Component Value Date/Time   TRIG 118 01/22/2017 2258      Wt Readings from Last 3 Encounters:  02/01/19 104 lb (47.2 kg)  12/26/18 100 lb 12 oz (45.7 kg)  08/20/18 98 lb (44.5 kg)      Other studies Reviewed: Additional studies/ records that were reviewed today include:   Echocardiogram 06/2018: 1. The left ventricle has normal systolic function with an ejection fraction of 60-65%. The cavity size was normal. Left ventricular diastolic Doppler parameters are indeterminate.  2. The right ventricle has normal systolic function. The cavity was normal. There is no increase in right ventricular wall thickness.  3. The mitral valve is grossly normal. Mild thickening of the mitral valve leaflet.  4. The aortic valve is tricuspid. Mild thickening of the aortic valve.  5. The interatrial septum was not assessed.  Echocardiogram 02/2017: - Left ventricle: The cavity size was normal. Systolic function was   mildly to moderately reduced. The estimated ejection fraction was   in the range of 40% to 45%. Diffuse hypokinesis. - Aortic valve: Trileaflet; normal thickness leaflets. There was no   regurgitation. - Aortic root: The aortic root was normal in size. - Mitral valve: There was mild regurgitation. - Left atrium: The atrium was mildly dilated. - Right ventricle: The cavity size was normal. Wall thickness was   normal. Systolic function was normal. - Tricuspid valve: There was mild regurgitation. - Pulmonary arteries: Systolic pressure was within the normal   range. - Inferior vena cava: The vessel was normal in size. - Pericardium, extracardiac: There was no pericardial effusion.  Impressions:  - There is mild diffuse  hypokinesis, this is possibly secondary to   tachycardia during the acquisitions.    ASSESSMENT AND PLAN:  1. Preoperative risk assessment: She is anticipating an EUS/FNA for evaluation of suspected pancreatic neoplasm.  She reports occasional exertional chest pain and is quite limited in activity by SOB. While she had transient LV systolic dysfunction, paroxysmal atrial fibrillation, and coronary artery calcifications on CT scan with no  formal ischemic evaluation, feel further work-up would only delay care of suspected pancreatic cancer. Discussed with DOD, Dr. Audie Box, who is in agreement. - Patient would be at moderate but acceptable risk for the planned procedure without further cardiovascular testing.  - Patient has not started eliquis, though is agreeable to starting following her biopsy. Will defer to Dr. Paulita Fujita on when patient can initiate eliquis following her procedure.  - I will route this recommendation to the requesting party via Epic fax function  2. Chest pain in patient with coronary artery calcifications on CT scan: She has occasional exertional chest pain. No prior ischemic evaluation but noted to have coronary artery calcifications on prior CT scans. Also with strong family history of CAD. Risk factors include HTN, HLD, coronary artery calcifications, and tobacco abuse. She would benefit from an ischemic evaluation if biopsy is unrevealing, however favor holding off at this time as this would only delay work-up/management of possible pancreatic cancer. Plan discussed with DOD, Dr. Audie Box, who was in agreement.  - Rx for SL nitro given - administration instructions reviewed. - Continue risk factor modifications - goal BP <130/80, LDL <70, and HgbA1C <7  3. Paroxysmal atrial fibrillation: she is in NSR today. She reports occasional palpitations. Had not started eliquis yet. We discussed the indication for eliquis.  - Continue eliquis (post biopsy) for stroke ppx given CHA2DS2-VASc  Score of 6 (CHF, HTN, Vascular, Female, and age >/= 57)  - Continue metoprolol for rate control  4. Chronic combined CHF: patient had transient LV systolic dysfunction with EF 40-45% 02/2017 with recovery of EF to 60-65% on echo 06/2018. No volume overload complaints and she appears euvolemic - Continue metoprolol - Continue prn lasix for weight gain or LE edema  5. HTN: BP 188/85 today. She has previously been on diltiazem which caused LE edema, so will avoid amlodipine - Continue metoprolol - she has only been taking once daily so will transition to metoprolol succinate 25mg  daily - Will start losartan 25mg  daily  6. HLD: LDL 68 01/2018 - Continue atorvastatin  7. Tobacco abuse: she smoked 1ppd. We discussed the importance of smoking cessation. She wants to quit and is hopeful to do so in the near future - Continue to encourage smoking cessation.    Current medicines are reviewed at length with the patient today.  The patient does not have concerns regarding medicines.  The following changes have been made:  As above  Labs/ tests ordered today include:   Orders Placed This Encounter  Procedures  . EKG 12-Lead     Disposition:   FU with me 02/12/19 for close follow-up of her blood pressure and to discuss next steps for possible ischemic evaluation following biopsy results.    Signed, Abigail Butts, PA-C  02/01/2019 8:28 PM

## 2019-02-01 ENCOUNTER — Encounter: Payer: Self-pay | Admitting: Medical

## 2019-02-01 ENCOUNTER — Ambulatory Visit (INDEPENDENT_AMBULATORY_CARE_PROVIDER_SITE_OTHER): Payer: Medicare HMO | Admitting: Medical

## 2019-02-01 ENCOUNTER — Other Ambulatory Visit: Payer: Self-pay

## 2019-02-01 VITALS — BP 188/85 | HR 67 | Ht 61.0 in | Wt 104.0 lb

## 2019-02-01 DIAGNOSIS — E782 Mixed hyperlipidemia: Secondary | ICD-10-CM

## 2019-02-01 DIAGNOSIS — Z01810 Encounter for preprocedural cardiovascular examination: Secondary | ICD-10-CM

## 2019-02-01 DIAGNOSIS — I480 Paroxysmal atrial fibrillation: Secondary | ICD-10-CM

## 2019-02-01 DIAGNOSIS — R072 Precordial pain: Secondary | ICD-10-CM

## 2019-02-01 DIAGNOSIS — I10 Essential (primary) hypertension: Secondary | ICD-10-CM

## 2019-02-01 DIAGNOSIS — I2510 Atherosclerotic heart disease of native coronary artery without angina pectoris: Secondary | ICD-10-CM

## 2019-02-01 DIAGNOSIS — I5042 Chronic combined systolic (congestive) and diastolic (congestive) heart failure: Secondary | ICD-10-CM

## 2019-02-01 DIAGNOSIS — Z720 Tobacco use: Secondary | ICD-10-CM

## 2019-02-01 MED ORDER — APIXABAN 5 MG PO TABS: 5.0000 mg | ORAL_TABLET | Freq: Two times a day (BID) | ORAL | 3 refills | Status: DC

## 2019-02-01 MED ORDER — METOPROLOL SUCCINATE ER 25 MG PO TB24: 50.0000 mg | ORAL_TABLET | Freq: Every day | ORAL | 3 refills | Status: DC

## 2019-02-01 MED ORDER — NITROGLYCERIN 0.4 MG SL SUBL: 0.4000 mg | SUBLINGUAL_TABLET | SUBLINGUAL | 3 refills | Status: DC | PRN

## 2019-02-01 MED ORDER — LOSARTAN POTASSIUM 25 MG PO TABS: 25.0000 mg | ORAL_TABLET | Freq: Every day | ORAL | 3 refills | Status: DC

## 2019-02-01 NOTE — Patient Instructions (Addendum)
Medication Instructions:  Stop taking Metoprolol Tartrate. Start taking 25mg  Metoprolol Succinate daily. Start taking 25mg  Losartan daily. Start taking 0.4mg  Nitroglycerin for chest pain as needed.  If you need a refill on your cardiac medications before your next appointment, please call your pharmacy.   Lab work: NONE  Testing/Procedures: NONE  Follow-Up: At [facility], you and your health needs are our priority.  As part of our continuing mission to provide you with exceptional heart care, we have created designated Provider Care Teams.  These Care Teams include your primary Cardiologist (physician) and Advanced Practice Providers (APPs -  Physician Assistants and Nurse Practitioners) who all work together to provide you with the care you need, when you need it. You may see Dr. [place] or one of the following Advanced Practice Providers on your designated Care Team:    Almyra Deforest, PA-C  Fabian Sharp, [place] or   Roby Lofts, [place]  Your physician wants you to follow-up in: 2-3 weeks with Roby Lofts, PA.  Any Other Special Instructions Will Be Listed Below (If Applicable). Keep a daily Blood Pressure log until your next visit.   Smoking Tobacco Information, Adult Smoking tobacco can be harmful to your health. Tobacco contains a poisonous (toxic), colorless chemical called nicotine. Nicotine is addictive. It changes the brain and can make it hard to stop smoking. Tobacco also has other toxic chemicals that can hurt your body and raise your risk of many cancers. How can smoking tobacco affect me? Smoking tobacco puts you at risk for:  Cancer. Smoking is most commonly associated with lung cancer, but can also lead to cancer in other parts of the body.  Chronic obstructive pulmonary disease (COPD). This is a long-term lung condition that makes it hard to breathe. It also gets worse over time.  High blood pressure (hypertension), heart disease, stroke, or heart attack.  Lung  infections, such as pneumonia.  Cataracts. This is when the lenses in the eyes become clouded.  Digestive problems. This may include peptic ulcers, heartburn, and gastroesophageal reflux disease (GERD).  Oral health problems, such as gum disease and tooth loss.  Loss of taste and smell. Smoking can affect your appearance by causing:  Wrinkles.  Yellow or stained teeth, fingers, and fingernails. Smoking tobacco can also affect your social life, because:  It may be challenging to find places to smoke when away from home. Many workplaces, [employer], hotels, and public places are tobacco-free.  Smoking is expensive. This is due to the cost of tobacco and the long-term costs of treating health problems from smoking.  Secondhand smoke may affect those around you. Secondhand smoke can cause lung cancer, breathing problems, and heart disease. Children of smokers have a higher risk for: ? Sudden infant death syndrome (SIDS). ? Ear infections. ? Lung infections. If you currently smoke tobacco, quitting now can help you:  Lead a longer and healthier life.  Look, smell, breathe, and feel better over time.  Save money.  Protect others from the harms of secondhand smoke. What actions can I take to prevent health problems? Quit smoking   Do not start smoking. Quit if you already do.  Make a plan to quit smoking and commit to it. Look for programs to help you and ask your health care provider for recommendations and ideas.  Set a date and write down all the reasons you want to quit.  Let your friends and family know you are quitting so they can help and support you. Consider finding friends  who also want to quit. It can be easier to quit with someone else, so that you can support each other.  Talk with your health care provider about using nicotine replacement medicines to help you quit, such as gum, lozenges, patches, sprays, or pills.  Do not replace cigarette smoking with  electronic cigarettes, which are commonly called e-cigarettes. The safety of e-cigarettes is not known, and some may contain harmful chemicals.  If you try to quit but return to smoking, stay positive. It is common to slip up when you first quit, so take it one day at a time.  Be prepared for cravings. When you feel the urge to smoke, chew gum or suck on hard candy. Lifestyle  Stay busy and take care of your body.  Drink enough fluid to keep your urine pale yellow.  Get plenty of exercise and eat a healthy diet. This can help prevent weight gain after quitting.  Monitor your eating habits. Quitting smoking can cause you to have a larger appetite than when you smoke.  Find ways to relax. Go out with friends or family to a movie or a restaurant where people do not smoke.  Ask your health care provider about having regular tests (screenings) to check for cancer. This may include blood tests, imaging tests, and other tests.  Find ways to manage your stress, such as meditation, yoga, or exercise. Where to find support To get support to quit smoking, consider:  Asking your health care provider for more information and resources.  Taking classes to learn more about quitting smoking.  Looking for local organizations that offer resources about quitting smoking.  Joining a support group for people who want to quit smoking in your local community.  Calling the smokefree.gov counselor helpline: 1-800-Quit-Now 3075923017) Where to find more information You may find more information about quitting smoking from:  HelpGuide.org: www.helpguide.org  https://hall.com/: smokefree.gov  American Lung Association: www.lung.org Contact a health care provider if you:  Have problems breathing.  Notice that your lips, nose, or fingers turn blue.  Have chest pain.  Are coughing up blood.  Feel faint or you pass out.  Have other health changes that cause you to worry. Summary  Smoking  tobacco can negatively affect your health, the health of those around you, your finances, and your social life.  Do not start smoking. Quit if you already do. If you need help quitting, ask your health care provider.  Think about joining a support group for people who want to quit smoking in your local community. There are many effective programs that will help you to quit this behavior. This information is not intended to replace advice given to you by your health care provider. Make sure you discuss any questions you have with your health care provider. Document Revised: 09/25/2018 Document Reviewed: 01/16/2016 Elsevier Patient Education  [place].    Low-Sodium Eating Plan Sodium, which is an element that makes up salt, helps you maintain a healthy balance of fluids in your body. Too much sodium can increase your blood pressure and cause fluid and waste to be held in your body. Your health care provider or dietitian may recommend following this plan if you have high blood pressure (hypertension), kidney disease, liver disease, or heart failure. Eating less sodium can help lower your blood pressure, reduce swelling, and protect your heart, liver, and kidneys. What are tips for following this plan? General guidelines  Most people on this plan should limit their sodium intake  to 1,500-2,000 mg (milligrams) of sodium each day. Reading food labels   The Nutrition Facts label lists the amount of sodium in one serving of the food. If you eat more than one serving, you must multiply the listed amount of sodium by the number of servings.  Choose foods with less than 140 mg of sodium per serving.  Avoid foods with 300 mg of sodium or more per serving. Shopping  Look for lower-sodium products, often labeled as "low-sodium" or "no salt added."  Always check the sodium content even if foods are labeled as "unsalted" or "no salt added".  Buy fresh foods. ? Avoid canned foods and  premade or frozen meals. ? Avoid canned, cured, or processed meats  Buy breads that have less than 80 mg of sodium per slice. Cooking  Eat more home-cooked food and less restaurant, buffet, and fast food.  Avoid adding salt when cooking. Use salt-free seasonings or herbs instead of table salt or sea salt. Check with your health care provider or pharmacist before using salt substitutes.  Cook with plant-based oils, such as canola, sunflower, or olive oil. Meal planning  When eating at a restaurant, ask that your food be prepared with less salt or no salt, if possible.  Avoid foods that contain MSG (monosodium glutamate). MSG is sometimes added to [place] food, bouillon, and some canned foods. What foods are recommended? The items listed may not be a complete list. Talk with your dietitian about what dietary choices are best for you. Grains Low-sodium cereals, including oats, puffed wheat and rice, and shredded wheat. Low-sodium crackers. Unsalted rice. Unsalted pasta. Low-sodium bread. Whole-grain breads and whole-grain pasta. Vegetables Fresh or frozen vegetables. "No salt added" canned vegetables. "No salt added" tomato sauce and paste. Low-sodium or reduced-sodium tomato and vegetable juice. Fruits Fresh, frozen, or canned fruit. Fruit juice. Meats and other protein foods Fresh or frozen (no salt added) meat, poultry, seafood, and fish. Low-sodium canned tuna and salmon. Unsalted nuts. Dried peas, beans, and lentils without added salt. Unsalted canned beans. Eggs. Unsalted nut butters. Dairy Milk. Soy milk. Cheese that is naturally low in sodium, such as ricotta cheese, fresh mozzarella, or Swiss cheese Low-sodium or reduced-sodium cheese. Cream cheese. Yogurt. Fats and oils Unsalted butter. Unsalted margarine with no trans fat. Vegetable oils such as canola or olive oils. Seasonings and other foods Fresh and dried herbs and spices. Salt-free seasonings. Low-sodium mustard and  ketchup. Sodium-free salad dressing. Sodium-free light mayonnaise. Fresh or refrigerated horseradish. Lemon juice. Vinegar. Homemade, reduced-sodium, or low-sodium soups. Unsalted popcorn and pretzels. Low-salt or salt-free chips. What foods are not recommended? The items listed may not be a complete list. Talk with your dietitian about what dietary choices are best for you. Grains Instant hot cereals. Bread stuffing, pancake, and biscuit mixes. Croutons. Seasoned rice or pasta mixes. Noodle soup cups. Boxed or frozen macaroni and cheese. Regular salted crackers. Self-rising flour. Vegetables Sauerkraut, pickled vegetables, and relishes. Olives. [place] fries. Onion rings. Regular canned vegetables (not low-sodium or reduced-sodium). Regular canned tomato sauce and paste (not low-sodium or reduced-sodium). Regular tomato and vegetable juice (not low-sodium or reduced-sodium). Frozen vegetables in sauces. Meats and other protein foods Meat or fish that is salted, canned, smoked, spiced, or pickled. Bacon, ham, sausage, hotdogs, corned beef, chipped beef, packaged lunch meats, salt pork, jerky, pickled herring, anchovies, regular canned tuna, sardines, salted nuts. Dairy Processed cheese and cheese spreads. Cheese curds. Blue cheese. Feta cheese. String cheese. Regular cottage cheese. Buttermilk. Canned milk. Fats  and oils Salted butter. Regular margarine. Ghee. Bacon fat. Seasonings and other foods Onion salt, garlic salt, seasoned salt, table salt, and sea salt. Canned and packaged gravies. Worcestershire sauce. Tartar sauce. Barbecue sauce. Teriyaki sauce. Soy sauce, including reduced-sodium. Steak sauce. Fish sauce. Oyster sauce. Cocktail sauce. Horseradish that you find on the shelf. Regular ketchup and mustard. Meat flavorings and tenderizers. Bouillon cubes. Hot sauce and Tabasco sauce. Premade or packaged marinades. Premade or packaged taco seasonings. Relishes. Regular salad dressings. Salsa.  Potato and tortilla chips. Corn chips and puffs. Salted popcorn and pretzels. Canned or dried soups. Pizza. Frozen entrees and pot pies. Summary  Eating less sodium can help lower your blood pressure, reduce swelling, and protect your heart, liver, and kidneys.  Most people on this plan should limit their sodium intake to 1,500-2,000 mg (milligrams) of sodium each day.  Canned, boxed, and frozen foods are high in sodium. Restaurant foods, fast foods, and pizza are also very high in sodium. You also get sodium by adding salt to food.  Try to cook at home, eat more fresh fruits and vegetables, and eat less fast food, canned, processed, or prepared foods. This information is not intended to replace advice given to you by your health care provider. Make sure you discuss any questions you have with your health care provider. Document Revised: 12/13/2016 Document Reviewed: 12/25/2015 Elsevier Patient Education  2020 [employer].

## 2019-02-02 NOTE — Progress Notes (Signed)
Left voicemessage regarding preop call

## 2019-02-11 ENCOUNTER — Encounter: Payer: Self-pay | Admitting: *Deleted

## 2019-02-11 ENCOUNTER — Ambulatory Visit: Payer: Medicare HMO | Admitting: Internal Medicine

## 2019-02-11 NOTE — Progress Notes (Unsigned)
Patient ID: Allison Vaughan, female   DOB: October 02, 1945, 74 y.o.   MRN: [id number] Patient requested cancellation of cardiac event monitor enrolled with [employer] on 12/24/2018.

## 2019-02-13 ENCOUNTER — Other Ambulatory Visit (HOSPITAL_COMMUNITY)
Admission: RE | Admit: 2019-02-13 | Discharge: 2019-02-13 | Disposition: A | Discharge status: Home / Self Care | Payer: Medicare HMO | Source: Ambulatory Visit | Attending: Gastroenterology | Admitting: Gastroenterology

## 2019-02-13 DIAGNOSIS — Z20822 Contact with and (suspected) exposure to covid-19: Secondary | ICD-10-CM | POA: Insufficient documentation

## 2019-02-13 DIAGNOSIS — Z01812 Encounter for preprocedural laboratory examination: Secondary | ICD-10-CM | POA: Insufficient documentation

## 2019-02-13 LAB — SARS CORONAVIRUS 2 (TAT 6-24 HRS): SARS Coronavirus 2: NEGATIVE

## 2019-02-15 ENCOUNTER — Ambulatory Visit: Payer: Medicare HMO | Admitting: Medical

## 2019-02-15 NOTE — Progress Notes (Deleted)
Cardiology Office Note   Date:  02/15/2019   ID:  Allison Vaughan, DOB [birth year], MRN ID:[id number]  PCP:  Sandi Mariscal, MD  Cardiologist:  Pixie Casino, MD EP: None  No chief complaint on file.     History of Present Illness: Allison Vaughan is a 74 y.o. female with a PMH of CAD (noted on CT scans),  chronic combined CHF (EF 4-45% 02/2017, improved to 60-65% 06/2018)  paroxysmal atrial fibrillation, HTN, HLD, COPD, anxiety and depression, opiate abuse, and recent finding of a suspected pancreatic neoplasm who presents for ***  I evaluated her 02/01/19 for preoperative assessment for upcoming EUS/FNA to further evaluate a pancreatic mass. She reported occasional exertional chest pain and SOB, and limited mobility at that time, however, after discussion with Dr. Audie Box, I felt further work-up would only delay care of suspected pancreatic cancer therefore she was cleared for her procedure without further cardiac work-up. She was started on losartan 25mg  daily and transitioned to metoprolol succinate 25mg  daily for HTN given significantly elevated BP at that visit.   Prior to this visit she was only seen by cardiology during an admission to the hospital 02/2017 for the evaluation of new onset atrial fibrillation vs MAT, at which time anticoagulation was deferred given no clear Afib. She then declared herself to have paroxysmal atrial fibrillation during subsequent admissions, however had yet to start anticoagulation despite CHA2DS2-VASc Score of 6 (CHF, HTN, Vascular, Female, and age >/= 57).   She has not had a formal ischemic evaluation, though has been noted to have coronary artery calcifications on prior CT scans. She had an echo 02/2017 which showed EF 40-45% with diffuse hypokinesis, mild LAE, and mild TR. Echo 06/2018 showed EF 60-65%, indeterminate LV diastolic function, and no significant valvular abnormalities.   She presents today for close follow-up. It appears her EUS/FNA was delayed  again, now scheduled for 02/17/19.   1. Chest pain in patient with coronary artery calcifications on CT scan: She has occasional exertional chest pain. No prior ischemic evaluation but noted to have coronary artery calcifications on prior CT scans. Also with strong family history of CAD. Risk factors include HTN, HLD, coronary artery calcifications, and tobacco abuse. She would benefit from an ischemic evaluation if biopsy is unrevealing, however favor holding off at this time as this would only delay work-up/management of possible pancreatic cancer. Plan previously discussed with Dr. Audie Box, who was in agreement.  - Continue risk factor modifications - goal BP <130/80, LDL <70, and HgbA1C <7  2. Paroxysmal atrial fibrillation: she is in *** today. She reports occasional palpitations. Had not started eliquis yet. We again discussed the indication for eliquis.  - Start eliquis (post biopsy) for stroke ppx given CHA2DS2-VASc Score of 6 (CHF, HTN, Vascular, Female, and age >/= 74)  - Continue metoprolol for rate control  3. Chronic combined CHF: patient had transient LV systolic dysfunction with EF 40-45% 02/2017 with recovery of EF to 60-65% on echo 06/2018. No volume overload complaints and she appears euvolemic - Continue metoprolol - Continue prn lasix for weight gain or LE edema  4. HTN: BP *** today. She has previously been on diltiazem which caused LE edema, so will avoid amlodipine. Started on losartan and metoprolol succinate last visit.  - Continue metoprolol succinate and losartan  5. HLD: LDL 68 01/2018 - Continue atorvastatin  6. Tobacco abuse: she smokes 1ppd. We discussed the importance of smoking cessation. She wants to quit and is hopeful  to do so in the near future - Continue to encourage smoking cessation.   7. Pancreatic mass: awaiting EUS/FNA 02/17/19 to further evaluation a pancreatic mass noted on imaging.  - Previously cleared for low risk surgery at moderate risk - no  further cardiac testing at this time. If biopsy results are benign, will pursue ischemic evaluation for #1.   Past Medical History:  Diagnosis Date  . Accidental drug overdose   . Acute encephalopathy   . Acute on chronic respiratory failure with hypoxia ([place]) 03/01/2017  . Acute renal failure ([place]) 04/16/2014  . AKI (acute kidney injury) ([place]) 04/30/2014  . Altered mental status 06/27/2012  . Anxiety   . Atrial fibrillation ([place])   . CAD (coronary artery disease)   . CAP (community acquired pneumonia) 04/18/2014  . Cardiomyopathy ([place])   . Chronic hypoxemic respiratory failure ([place]) 06/26/2012  . Chronic pain   . COPD exacerbation ([place]) 06/26/2012  . COPD with acute exacerbation ([place]) 03/01/2017  . DEPRESSIVE DISORDER, NOS 03/13/2006   Qualifier: Diagnosis of  By: Damita Dunnings MD, Phillip Heal    . Edema, peripheral   . Essential hypertension 04/30/2014  . Fall at home, initial encounter 03/01/2017  . GASTROESOPHAGEAL REFLUX, NO ESOPHAGITIS 03/13/2006   Qualifier: Diagnosis of  By: Damita Dunnings MD, Phillip Heal    . HYPERCHOLESTEROLEMIA 03/13/2006   Qualifier: Diagnosis of  By: Damita Dunnings MD, Phillip Heal    . Hyperlipidemia   . HYPERTENSION, BENIGN SYSTEMIC 03/13/2006   Qualifier: Diagnosis of  By: Damita Dunnings MD, Phillip Heal    . Hypokalemia 04/30/2014  . Hyponatremia 04/30/2014  . Hypotension 04/30/2014  . Hypoxemia 06/26/2012  . Hypoxia 03/26/2018  . Irritable bowel syndrome 03/13/2006   Qualifier: Diagnosis of  By: Damita Dunnings MD, Phillip Heal    . Lung nodule 04/16/2014  . MENOPAUSAL SYNDROME 03/13/2006   Qualifier: Diagnosis of  By: Damita Dunnings MD, Phillip Heal    . Mood disorder [facility]) 03/13/2006   Qualifier: Diagnosis of  By: Damita Dunnings MD, Phillip Heal    . 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 Nausea 04/30/2014  . Nausea vomiting and diarrhea 04/16/2014  . OSTEOARTHRITIS, MULTI SITES 03/13/2006   Qualifier: Diagnosis of  By: Damita Dunnings MD, Phillip Heal    . Pulmonary nodule 04/30/2014  . Respiratory failure ([place]) 01/22/2017  . TOBACCO DEPENDENCE 03/13/2006   Qualifier:  Diagnosis of  By: Damita Dunnings MD, Phillip Heal    . Weight loss     Past Surgical History:  Procedure Laterality Date  . EYE SURGERY    . ROTATOR CUFF REPAIR Right 2012     Current Outpatient Medications  Medication Sig Dispense Refill  . albuterol (PROVENTIL HFA;VENTOLIN HFA) 108 (90 BASE) MCG/ACT inhaler Inhale 2 puffs into the lungs every 6 (six) hours as needed for wheezing. 1 Inhaler 2  . albuterol (PROVENTIL) (2.5 MG/3ML) 0.083% nebulizer solution Take 3 mLs (2.5 mg total) by nebulization every 2 (two) hours as needed for wheezing. 75 mL 12  . apixaban (ELIQUIS) 5 MG TABS tablet Take 1 tablet (5 mg total) by mouth 2 (two) times daily. 180 tablet 3  . Aspirin-Salicylamide-Caffeine (BC HEADACHE PO) Take 1 packet by mouth daily as needed (headaches).    Marland Kitchen atorvastatin (LIPITOR) 10 MG tablet Take 1 tablet (10 mg total) by mouth at bedtime. 30 tablet 0  . FLUoxetine (PROZAC) 40 MG capsule Take 40 mg by mouth daily.    . fluticasone (FLONASE) 50 MCG/ACT nasal spray Place 2 sprays into both nostrils daily. 16 g 0  . ibuprofen (ADVIL) 200 MG tablet  Take 400 mg by mouth every 6 (six) hours as needed for moderate pain.    Marland Kitchen LORazepam (ATIVAN) 0.5 MG tablet Take 0.5 mg by mouth at bedtime.     Marland Kitchen losartan (COZAAR) 25 MG tablet Take 1 tablet (25 mg total) by mouth daily. 90 tablet 3  . metoprolol succinate (TOPROL-XL) 25 MG 24 hr tablet Take 2 tablets (50 mg total) by mouth daily. Take with or immediately following a meal. 90 tablet 3  . nitroGLYCERIN (NITROSTAT) 0.4 MG SL tablet Place 1 tablet (0.4 mg total) under the tongue every 5 (five) minutes as needed for chest pain. 90 tablet 3  . Nutritional Supplements (ENSURE ENLIVE PO) Take 237 mLs by mouth every other day.    . Oxycodone HCl 10 MG TABS Take 10 mg by mouth 4 (four) times daily.     . potassium chloride SA (K-DUR) 20 MEQ tablet Take 20 mEq by mouth daily.     Marland Kitchen senna-docusate (SENOKOT-S) 8.6-50 MG tablet Take 1 tablet by mouth at bedtime. 30  tablet 0  . umeclidinium bromide (INCRUSE ELLIPTA) 62.5 MCG/INH AEPB Inhale 1 puff into the lungs daily. 30 each 0   No current facility-administered medications for this visit.    Allergies:   Patient has no known allergies.    Social History:  The patient  reports that she has been smoking cigarettes. She has been smoking about 1.00 pack per day. She has never used smokeless tobacco. She reports that she does not drink alcohol or use drugs.   Family History:  The patient's ***family history includes CAD in her father; Healthy in her daughter, daughter, and son; Lung cancer in her brother.    ROS:  Please see the history of present illness.   Otherwise, review of systems are positive for {NONE DEFAULTED:18576::"none"}.   All other systems are reviewed and negative.    PHYSICAL EXAM: VS:  There were no vitals taken for this visit. , BMI There is no height or weight on file to calculate BMI. GEN: Well nourished, well developed, in no acute distress HEENT: normal Neck: no JVD, carotid bruits, or masses Cardiac: ***RRR; no murmurs, rubs, or gallops,no edema  Respiratory:  clear to auscultation bilaterally, normal work of breathing GI: soft, nontender, nondistended, + BS MS: no deformity or atrophy Skin: warm and dry, no rash Neuro:  Strength and sensation are intact Psych: euthymic mood, full affect   EKG:  EKG {ACTION; IS/IS [id number] ordered today. The ekg ordered today demonstrates ***   Recent Labs: 07/09/2018: TSH 1.661 08/20/2018: B Natriuretic Peptide 39.9 12/22/2018: Hemoglobin 15.0; Platelets 343 12/23/2018: ALT 8 12/25/2018: Magnesium 2.4 12/26/2018: BUN 14; Creatinine, Ser 0.90; Potassium 3.3; Sodium 135    Lipid Panel    Component Value Date/Time   TRIG 118 01/22/2017 2258      Wt Readings from Last 3 Encounters:  02/01/19 104 lb (47.2 kg)  12/26/18 100 lb 12 oz (45.7 kg)  08/20/18 98 lb (44.5 kg)      Other studies Reviewed: Additional studies/  records that were reviewed today include:    Echocardiogram 06/2018: 1. The left ventricle has normal systolic function with an ejection fraction of 60-65%. The cavity size was normal. Left ventricular diastolic Doppler parameters are indeterminate. 2. The right ventricle has normal systolic function. The cavity was normal. There is no increase in right ventricular wall thickness. 3. The mitral valve is grossly normal. Mild thickening of the mitral valve leaflet. 4. The aortic valve is  tricuspid. Mild thickening of the aortic valve. 5. The interatrial septum was not assessed.  Echocardiogram 02/2017: - Left ventricle: The cavity size was normal. Systolic function was mildly to moderately reduced. The estimated ejection fraction was in the range of 40% to 45%. Diffuse hypokinesis. - Aortic valve: Trileaflet; normal thickness leaflets. There was no regurgitation. - Aortic root: The aortic root was normal in size. - Mitral valve: There was mild regurgitation. - Left atrium: The atrium was mildly dilated. - Right ventricle: The cavity size was normal. Wall thickness was normal. Systolic function was normal. - Tricuspid valve: There was mild regurgitation. - Pulmonary arteries: Systolic pressure was within the normal range. - Inferior vena cava: The vessel was normal in size. - Pericardium, extracardiac: There was no pericardial effusion.  Impressions:  - There is mild diffuse hypokinesis, this is possibly secondary to tachycardia during the acquisitions.    ASSESSMENT AND PLAN:  1.  ***   Current medicines are reviewed at length with the patient today.  The patient {ACTIONS; HAS/DOES NOT HAVE:19233} concerns regarding medicines.  The following changes have been made:  {PLAN; NO CHANGE:13088:s}  Labs/ tests ordered today include: *** No orders of the defined types were placed in this encounter.    Disposition:   FU with *** in {gen number [id number]  {Days to years:10300}  Signed, Abigail Butts, PA-C  02/15/2019 9:18 AM

## 2019-02-16 ENCOUNTER — Encounter (HOSPITAL_COMMUNITY): Payer: Self-pay | Admitting: Certified Registered Nurse Anesthetist

## 2019-02-17 ENCOUNTER — Encounter (HOSPITAL_COMMUNITY): Admission: RE | Payer: Self-pay | Source: Home / Self Care

## 2019-02-17 ENCOUNTER — Ambulatory Visit (HOSPITAL_COMMUNITY): Admission: RE | Admit: 2019-02-17 | Payer: Medicare HMO | Source: Home / Self Care | Admitting: Gastroenterology

## 2019-02-17 SURGERY — UPPER ESOPHAGEAL ENDOSCOPIC ULTRASOUND (EUS)
Anesthesia: Monitor Anesthesia Care

## 2019-03-31 ENCOUNTER — Encounter: Payer: Self-pay | Admitting: Medical

## 2019-05-01 ENCOUNTER — Other Ambulatory Visit (HOSPITAL_COMMUNITY)
Admission: RE | Admit: 2019-05-01 | Discharge: 2019-05-01 | Disposition: A | Discharge status: Home / Self Care | Payer: Medicare HMO | Source: Ambulatory Visit | Attending: Gastroenterology | Admitting: Gastroenterology

## 2019-05-01 DIAGNOSIS — Z01812 Encounter for preprocedural laboratory examination: Secondary | ICD-10-CM | POA: Diagnosis present

## 2019-05-01 DIAGNOSIS — Z20822 Contact with and (suspected) exposure to covid-19: Secondary | ICD-10-CM | POA: Insufficient documentation

## 2019-05-01 LAB — SARS CORONAVIRUS 2 (TAT 6-24 HRS): SARS Coronavirus 2: NEGATIVE

## 2019-05-04 ENCOUNTER — Other Ambulatory Visit: Payer: Self-pay | Admitting: Gastroenterology

## 2019-05-05 ENCOUNTER — Ambulatory Visit (HOSPITAL_COMMUNITY): Payer: Medicare HMO | Admitting: Anesthesiology

## 2019-05-05 ENCOUNTER — Ambulatory Visit (HOSPITAL_COMMUNITY)
Admission: RE | Admit: 2019-05-05 | Discharge: 2019-05-05 | Disposition: A | Discharge status: Home / Self Care | Payer: Medicare HMO | Attending: Gastroenterology | Admitting: Gastroenterology

## 2019-05-05 ENCOUNTER — Other Ambulatory Visit: Payer: Self-pay

## 2019-05-05 ENCOUNTER — Encounter (HOSPITAL_COMMUNITY)
Admission: RE | Disposition: A | Discharge status: Home / Self Care | Payer: Self-pay | Source: Home / Self Care | Attending: Gastroenterology

## 2019-05-05 ENCOUNTER — Encounter (HOSPITAL_COMMUNITY): Payer: Self-pay | Admitting: Gastroenterology

## 2019-05-05 DIAGNOSIS — E785 Hyperlipidemia, unspecified: Secondary | ICD-10-CM | POA: Diagnosis not present

## 2019-05-05 DIAGNOSIS — E7800 Pure hypercholesterolemia, unspecified: Secondary | ICD-10-CM | POA: Insufficient documentation

## 2019-05-05 DIAGNOSIS — I10 Essential (primary) hypertension: Secondary | ICD-10-CM | POA: Insufficient documentation

## 2019-05-05 DIAGNOSIS — K219 Gastro-esophageal reflux disease without esophagitis: Secondary | ICD-10-CM | POA: Diagnosis not present

## 2019-05-05 DIAGNOSIS — D490 Neoplasm of unspecified behavior of digestive system: Secondary | ICD-10-CM | POA: Diagnosis not present

## 2019-05-05 DIAGNOSIS — I429 Cardiomyopathy, unspecified: Secondary | ICD-10-CM | POA: Insufficient documentation

## 2019-05-05 DIAGNOSIS — F329 Major depressive disorder, single episode, unspecified: Secondary | ICD-10-CM | POA: Diagnosis not present

## 2019-05-05 DIAGNOSIS — F419 Anxiety disorder, unspecified: Secondary | ICD-10-CM | POA: Diagnosis not present

## 2019-05-05 DIAGNOSIS — Z79899 Other long term (current) drug therapy: Secondary | ICD-10-CM | POA: Diagnosis not present

## 2019-05-05 DIAGNOSIS — J449 Chronic obstructive pulmonary disease, unspecified: Secondary | ICD-10-CM | POA: Insufficient documentation

## 2019-05-05 DIAGNOSIS — J9691 Respiratory failure, unspecified with hypoxia: Secondary | ICD-10-CM | POA: Diagnosis not present

## 2019-05-05 DIAGNOSIS — I4891 Unspecified atrial fibrillation: Secondary | ICD-10-CM | POA: Insufficient documentation

## 2019-05-05 DIAGNOSIS — K8689 Other specified diseases of pancreas: Secondary | ICD-10-CM | POA: Diagnosis present

## 2019-05-05 DIAGNOSIS — Z7901 Long term (current) use of anticoagulants: Secondary | ICD-10-CM | POA: Diagnosis not present

## 2019-05-05 DIAGNOSIS — F17210 Nicotine dependence, cigarettes, uncomplicated: Secondary | ICD-10-CM | POA: Insufficient documentation

## 2019-05-05 DIAGNOSIS — M1990 Unspecified osteoarthritis, unspecified site: Secondary | ICD-10-CM | POA: Insufficient documentation

## 2019-05-05 DIAGNOSIS — I2510 Atherosclerotic heart disease of native coronary artery without angina pectoris: Secondary | ICD-10-CM | POA: Insufficient documentation

## 2019-05-05 HISTORY — PX: FINE NEEDLE ASPIRATION: SHX5430

## 2019-05-05 HISTORY — PX: ESOPHAGOGASTRODUODENOSCOPY (EGD) WITH PROPOFOL: SHX5813

## 2019-05-05 HISTORY — PX: UPPER ESOPHAGEAL ENDOSCOPIC ULTRASOUND (EUS): SHX6562

## 2019-05-05 SURGERY — UPPER ESOPHAGEAL ENDOSCOPIC ULTRASOUND (EUS)
Anesthesia: Monitor Anesthesia Care

## 2019-05-05 SURGERY — UPPER ENDOSCOPIC ULTRASOUND (EUS) LINEAR
Anesthesia: Monitor Anesthesia Care | Laterality: Left

## 2019-05-05 MED ORDER — HYDRALAZINE HCL 20 MG/ML IJ SOLN
10.0000 mg | Freq: Once | INTRAMUSCULAR | Status: AC
  Administered 2019-05-05: 10 mg via INTRAVENOUS

## 2019-05-05 MED ORDER — LACTATED RINGERS IV SOLN
INTRAVENOUS | Status: DC
  Administered 2019-05-05: 1000 mL via INTRAVENOUS

## 2019-05-05 MED ORDER — SODIUM CHLORIDE 0.9 % IV SOLN: INTRAVENOUS | Status: DC

## 2019-05-05 MED ORDER — LABETALOL HCL 5 MG/ML IV SOLN
INTRAVENOUS | Status: AC
  Filled 2019-05-05: qty 4

## 2019-05-05 MED ORDER — PROPOFOL 10 MG/ML IV BOLUS
INTRAVENOUS | Status: DC | PRN
  Administered 2019-05-05 (×3): 10 mg via INTRAVENOUS

## 2019-05-05 MED ORDER — LABETALOL HCL 5 MG/ML IV SOLN
5.0000 mg | Freq: Once | INTRAVENOUS | Status: AC
  Administered 2019-05-05: 5 mg via INTRAVENOUS

## 2019-05-05 MED ORDER — LIDOCAINE HCL 1 % IJ SOLN
INTRAMUSCULAR | Status: DC | PRN
  Administered 2019-05-05: 60 mg via INTRADERMAL

## 2019-05-05 MED ORDER — PROPOFOL 500 MG/50ML IV EMUL
INTRAVENOUS | Status: DC | PRN
  Administered 2019-05-05: 125 ug/kg/min via INTRAVENOUS

## 2019-05-05 MED ORDER — HYDRALAZINE HCL 20 MG/ML IJ SOLN
INTRAMUSCULAR | Status: AC
  Filled 2019-05-05: qty 1

## 2019-05-05 MED ORDER — LABETALOL HCL 5 MG/ML IV SOLN
INTRAVENOUS | Status: DC | PRN
  Administered 2019-05-05 (×2): 2.5 mg via INTRAVENOUS

## 2019-05-05 NOTE — Transfer of Care (Signed)
Immediate Anesthesia Transfer of Care Note  Patient: Allison Vaughan  Procedure(s) Performed: UPPER ESOPHAGEAL ENDOSCOPIC ULTRASOUND (EUS) with FNA (N/A ) FINE NEEDLE ASPIRATION (FNA) LINEAR (N/A )  Patient Location: PACU and Endoscopy Unit  Anesthesia Type:MAC  Level of Consciousness: awake, alert , oriented and patient cooperative  Airway & Oxygen Therapy: Patient Spontanous Breathing and Patient connected to face mask oxygen  Post-op Assessment: Report given to RN and Post -op Vital signs reviewed and stable  Post vital signs: Reviewed and stable  Last Vitals:  Vitals Value Taken Time  BP 217/90 05/05/19 1045  Temp 36.9 C 05/05/19 1044  Pulse 65 05/05/19 1049  Resp 22 05/05/19 1049  SpO2 100 % 05/05/19 1049  Vitals shown include unvalidated device data.  Last Pain:  Vitals:   05/05/19 1044  TempSrc: Oral  PainSc: 0-No pain         Complications: No apparent anesthesia complications

## 2019-05-05 NOTE — Anesthesia Postprocedure Evaluation (Signed)
Anesthesia Post Note  Patient: Allison Vaughan  Procedure(s) Performed: UPPER ESOPHAGEAL ENDOSCOPIC ULTRASOUND (EUS) with FNA (N/A ) FINE NEEDLE ASPIRATION (FNA) LINEAR (N/A )     Patient location during evaluation: Endoscopy Anesthesia Type: MAC Level of consciousness: awake and alert Pain management: pain level controlled Vital Signs Assessment: post-procedure vital signs reviewed and stable Respiratory status: spontaneous breathing, nonlabored ventilation, respiratory function stable and patient connected to nasal cannula oxygen Cardiovascular status: blood pressure returned to baseline and stable Postop Assessment: no apparent nausea or vomiting Anesthetic complications: no    Last Vitals:  Vitals:   05/05/19 1230 05/05/19 1240  BP: (!) 184/64 (!) 200/78  Pulse:    Resp: (!) 21   Temp:    SpO2:      Last Pain:  Vitals:   05/05/19 1121  TempSrc:   PainSc: 0-No pain                 Taquila Leys L Christia Domke

## 2019-05-05 NOTE — Discharge Instructions (Signed)

## 2019-05-05 NOTE — Op Note (Signed)
[facility] Patient Name: Allison Vaughan Procedure Date: 05/05/2019 MRN: [id number] Attending MD: Arta Silence , MD Date of Birth: [birth year] CSN: [id number] Age: 74 Admit Type: Outpatient Procedure:                Upper EUS Indications:              Dilated pancreatic duct on MRI, Suspected mass in                            pancreas on MRI, elevated Ca 19-9 Providers:                Arta Silence, MD, Cleda Daub, RN, Maxie Slovacek Dalton, Technician Referring MD:              Medicines:                Monitored Anesthesia Care Complications:            No immediate complications. Estimated Blood Loss:     Estimated blood loss: none. Procedure:                Pre-Anesthesia Assessment:                           - Prior to the procedure, a History and Physical                            was performed, and patient medications and                            allergies were reviewed. The patient's tolerance of                            previous anesthesia was also reviewed. The risks                            and benefits of the procedure and the sedation                            options and risks were discussed with the patient.                            All questions were answered, and informed consent                            was obtained. Prior Anticoagulants: The patient has                            taken no previous anticoagulant or antiplatelet                            agents. ASA Grade Assessment: III - A patient with  severe systemic disease. After reviewing the risks                            and benefits, the patient was deemed in                            satisfactory condition to undergo the procedure.                           After obtaining informed consent, the endoscope was                            passed under direct vision. Throughout the                            procedure, the  patient's blood pressure, pulse, and                            oxygen saturations were monitored continuously. The                            ([id number]) [id number] Linear EUS was introduced                            through the mouth, and advanced to the antrum of                            the stomach. The upper EUS was accomplished without                            difficulty. The patient tolerated the procedure                            well. Scope In: Scope Out: Findings:      ENDOSONOGRAPHIC FINDING: :      An irregular mass was identified in the pancreatic body. The mass was       hypoechoic. The mass measured 24 mm by 17 mm in maximal cross-sectional       diameter. The endosonographic borders were poorly-defined. The remainder       of the pancreas was examined. The endosonographic appearance of       parenchyma and the upstream pancreatic duct indicated duct dilation.       Appeared to be tissue ingrowth into portions of the dilated pancreatic       duct. Fine needle aspiration for cytology was performed. Color Doppler       imaging was utilized prior to needle puncture to confirm a lack of       significant vascular structures within the needle path. Four passes were       made with the 25 gauge needle using a transgastric approach. A stylet       was used. A cytotechnologist was present to evaluate the adequacy of the       specimen. Final cytology results are pending.      A few abnormal lymph nodes were visualized in the peripancreatic region.  The nodes were oval, hypoechoic and had well defined margins.      Pancreatic parenchymal abnormalities were noted in the genu of the       pancreas and pancreatic body. These consisted of edema and stranding. Impression:               - A mass was identified in the pancreatic body.                            This was staged T3 N1 Mx by endosonographic                            criteria. Fine needle aspiration performed.  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                           - A few abnormal lymph nodes were visualized in the                            peripancreatic region.                           - Pancreatic parenchymal abnormalities consisting                            of edema and stranding were noted in the genu of                            the pancreas and pancreatic body.                           - There was no evidence of significant pathology. Moderate Sedation:      None Recommendation:           - Discharge patient to home (via wheelchair).                           - Resume previous diet today.                           - Continue present medications.                           - Await cytology results.                           - Return to GI clinic after studies are complete.                           - Return to referring physician as previously  scheduled. Procedure Code(s):        --- Professional ---                           [phone], Esophagogastroduodenoscopy, flexible,                            transoral; with transendoscopic ultrasound-guided                            intramural or transmural fine needle                            aspiration/biopsy(s) (includes endoscopic                            ultrasound examination of the esophagus, stomach,                            and either the duodenum or a surgically altered                            stomach where the jejunum is examined distal to the                            anastomosis) Diagnosis Code(s):        --- Professional ---                           K86.89, Other specified diseases of pancreas                           I89.9, Noninfective disorder of lymphatic vessels                            and lymph nodes, unspecified                           R93.3, Abnormal findings on diagnostic  imaging of                            other parts of digestive tract CPT copyright 2019 [employer]. All rights reserved. The codes documented in this report are preliminary and upon coder review may  be revised to meet current compliance requirements. Arta Silence, MD 05/05/2019 10:47:51 AM This report has been signed electronically. Number of Addenda: 0

## 2019-05-05 NOTE — H&P (Signed)
[facility] Admission Note  Chief Complaint: pancreatic mass  HPI: Allison Vaughan is an 74 y.o. female here for EUS for evaluation of pancreatic mass.  Seen on MRI in December.  For variety of reasons, patient has missed/canceled several appointments to get this assessed sooner.  She has constant mild epigastric discomfort.  Denies prior history of pancreatitis.  Denies weight loss (weight fluctuates, but overall about same now as was one year ago).  Past Medical History:  Diagnosis Date  . Accidental drug overdose   . Acute encephalopathy   . Acute on chronic respiratory failure with hypoxia ([place]) 03/01/2017  . Acute renal failure ([place]) 04/16/2014  . AKI (acute kidney injury) ([place]) 04/30/2014  . Altered mental status 06/27/2012  . Anxiety   . Atrial fibrillation ([place])   . CAD (coronary artery disease)   . CAP (community acquired pneumonia) 04/18/2014  . Cardiomyopathy ([place])   . Chronic hypoxemic respiratory failure ([place]) 06/26/2012  . Chronic pain   . COPD exacerbation ([place]) 06/26/2012  . COPD with acute exacerbation ([place]) 03/01/2017  . DEPRESSIVE DISORDER, NOS 03/13/2006   Qualifier: Diagnosis of  By: Damita Dunnings MD, Phillip Heal    . Edema, peripheral   . Essential hypertension 04/30/2014  . Fall at home, initial encounter 03/01/2017  . GASTROESOPHAGEAL REFLUX, NO ESOPHAGITIS 03/13/2006   Qualifier: Diagnosis of  By: Damita Dunnings MD, Phillip Heal    . HYPERCHOLESTEROLEMIA 03/13/2006   Qualifier: Diagnosis of  By: Damita Dunnings MD, Phillip Heal    . Hyperlipidemia   . HYPERTENSION, BENIGN SYSTEMIC 03/13/2006   Qualifier: Diagnosis of  By: Damita Dunnings MD, Phillip Heal    . Hypokalemia 04/30/2014  . Hyponatremia 04/30/2014  . Hypotension 04/30/2014  . Hypoxemia 06/26/2012  . Hypoxia 03/26/2018  . Irritable bowel syndrome 03/13/2006   Qualifier: Diagnosis of  By: Damita Dunnings MD, Phillip Heal    . Lung nodule 04/16/2014  . MENOPAUSAL SYNDROME 03/13/2006   Qualifier: Diagnosis of  By: Damita Dunnings MD, Phillip Heal    . Mood disorder [facility]) 03/13/2006   Qualifier: Diagnosis of  By: Damita Dunnings MD, Phillip Heal    . 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 Nausea 04/30/2014  . Nausea vomiting and diarrhea 04/16/2014  . OSTEOARTHRITIS, MULTI SITES 03/13/2006   Qualifier: Diagnosis of  By: Damita Dunnings MD, Phillip Heal    . Pulmonary nodule 04/30/2014  . Respiratory failure ([place]) 01/22/2017  . TOBACCO DEPENDENCE 03/13/2006   Qualifier: Diagnosis of  By: Damita Dunnings MD, Phillip Heal    . Weight loss     Past Surgical History:  Procedure Laterality Date  . EYE SURGERY    . ROTATOR CUFF REPAIR Right 2012    Medications Prior to Admission  Medication Sig Dispense Refill  . albuterol (PROVENTIL HFA;VENTOLIN HFA) 108 (90 BASE) MCG/ACT inhaler Inhale 2 puffs into the lungs every 6 (six) hours as needed for wheezing. 1 Inhaler 2  . albuterol (PROVENTIL) (2.5 MG/3ML) 0.083% nebulizer solution Take 3 mLs (2.5 mg total) by nebulization every 2 (two) hours as needed for wheezing. 75 mL 12  . Aspirin-Salicylamide-Caffeine (BC HEADACHE PO) Take 1 packet by mouth 2 (two) times daily as needed (headaches).     Marland Kitchen atorvastatin (LIPITOR) 10 MG tablet Take 1 tablet (10 mg total) by mouth at bedtime. 30 tablet 0  . FLUoxetine (PROZAC) 40 MG capsule Take 40 mg by mouth daily.    . fluticasone (FLONASE) 50 MCG/ACT nasal spray Place 2 sprays into both nostrils daily. (Patient taking differently: Place 2 sprays into both nostrils daily as needed for rhinitis. )  16 g 0  . ibuprofen (ADVIL) 200 MG tablet Take 400 mg by mouth every 6 (six) hours as needed for moderate pain.    Marland Kitchen LORazepam (ATIVAN) 0.5 MG tablet Take 0.5 mg by mouth at bedtime.     Marland Kitchen losartan (COZAAR) 25 MG tablet Take 1 tablet (25 mg total) by mouth daily. 90 tablet 3  . metoprolol succinate (TOPROL-XL) 25 MG 24 hr tablet Take 2 tablets (50 mg total) by mouth daily. Take with or immediately following a meal. 90 tablet 3  . nitroGLYCERIN (NITROSTAT) 0.4 MG SL tablet Place 1 tablet (0.4 mg total) under the tongue every 5 (five) minutes as  needed for chest pain. 90 tablet 3  . omeprazole (PRILOSEC) 40 MG capsule Take 40 mg by mouth in the morning and at bedtime.    . Oxycodone HCl 10 MG TABS Take 10 mg by mouth 4 (four) times daily as needed (pain).     . Phenylephrine-Acetaminophen (SINUS + HEADACHE PO) Take 1 tablet by mouth daily as needed (sinus headaches).    . potassium chloride (KLOR-CON) 10 MEQ tablet Take 10 mEq by mouth daily.    Marland Kitchen apixaban (ELIQUIS) 5 MG TABS tablet Take 1 tablet (5 mg total) by mouth 2 (two) times daily. 180 tablet 3  . Nutritional Supplements (ENSURE ENLIVE PO) Take 237 mLs by mouth every other day.    . senna-docusate (SENOKOT-S) 8.6-50 MG tablet Take 1 tablet by mouth at bedtime. (Patient not taking: Reported on 04/23/2019) 30 tablet 0  . umeclidinium bromide (INCRUSE ELLIPTA) 62.5 MCG/INH AEPB Inhale 1 puff into the lungs daily. 30 each 0    Allergies: No Known Allergies  Family History  Problem Relation Age of Onset  . CAD Father   . Lung cancer Brother   . Healthy Son   . Healthy Daughter   . Healthy Daughter     Social History:  reports that she has been smoking cigarettes. She has been smoking about 1.00 pack per day. She has never used smokeless tobacco. She reports that she does not drink alcohol or use drugs.   ROS: As per HPI, all others negative  Weight 46.7 kg. General appearance: Thin, somewhat cachectic, NAD SKIN:  Scattered ecchymoses HEENT:  No jaundice ABD: Soft, mild epigastric tenderness NEURO:  Alert/oriented.  No results found for this or any previous visit (from the past 48 hour(s)). No results found.  Assessment/Plan  1.  Pancreatic mass.  Plan for upper endoscopic ultrasound with possible fine needle aspiration. 2.  Risks (bleeding, infection, bowel perforation that could require surgery, sedation-related changes in cardiopulmonary systems), benefits (identification and possible treatment of source of symptoms, exclusion of certain causes of symptoms), and  alternatives (watchful waiting, radiographic imaging studies, empiric medical treatment) of upper endoscopy with ultrasound and possible fine needle aspiration (EUS +/- FNA) were explained to patient/family in detail and patient wishes to proceed.  Landry Dyke 05/05/2019, 8:40 AM

## 2019-05-05 NOTE — Anesthesia Preprocedure Evaluation (Addendum)
Anesthesia Evaluation  Patient identified by MRN, date of birth, ID band Patient awake    Reviewed: Allergy & Precautions, NPO status , Patient's Chart, lab work & pertinent test results, reviewed documented beta blocker date and time   Airway Mallampati: II  TM Distance: >3 FB Neck ROM: Full    Dental  (+) Edentulous Upper, Edentulous Lower   Pulmonary COPD, Current Smoker,    Pulmonary exam normal breath sounds clear to auscultation       Cardiovascular hypertension, Pt. on medications and Pt. on home beta blockers + CAD  Normal cardiovascular exam+ dysrhythmias Atrial Fibrillation  Rhythm:Regular Rate:Normal  HLD  TTE 06/2018 EF 60-65%, no significant valvular abnormalities   Neuro/Psych PSYCHIATRIC DISORDERS Anxiety Depression negative neurological ROS     GI/Hepatic GERD  Medicated,(+)     substance abuse  ,   Endo/Other  negative endocrine ROS  Renal/GU negative Renal ROS  negative genitourinary   Musculoskeletal  (+) Arthritis , narcotic dependent  Abdominal   Peds  Hematology  (+) Blood dyscrasia (on eliquis), ,   Anesthesia Other Findings Pancreatic mass  Reproductive/Obstetrics                           Anesthesia Physical Anesthesia Plan  ASA: III  Anesthesia Plan: MAC   Post-op Pain Management:    Induction: Intravenous  PONV Risk Score and Plan: Propofol infusion and Treatment may vary due to age or medical condition  Airway Management Planned: Natural Airway  Additional Equipment:   Intra-op Plan:   Post-operative Plan:   Informed Consent: I have reviewed the patients History and Physical, chart, labs and discussed the procedure including the risks, benefits and alternatives for the proposed anesthesia with the patient or authorized representative who has indicated his/her understanding and acceptance.     Dental advisory given  Plan Discussed with:  CRNA  Anesthesia Plan Comments:         Anesthesia Quick Evaluation

## 2019-05-05 NOTE — Progress Notes (Addendum)
Recovering patient in GI recovery area. Outlaw, MD made aware of patient BP 229/93. Peggy CRNA gave labetolol, 5mg .  Patient BP on admission was 218/79. Patient did not take her BP meds this AM before procedure.  Called Dr. Lanetta Inch, ordered 5mg  more of Labetolol, given by this RN.

## 2019-05-05 NOTE — Progress Notes (Signed)
Patients BP down to 179/63. Ok per Dr. Lanetta Inch to discharge home.

## 2019-05-06 LAB — CYTOLOGY - NON PAP

## 2019-05-07 ENCOUNTER — Encounter: Payer: Self-pay | Admitting: *Deleted

## 2019-05-10 NOTE — Progress Notes (Signed)
Spoke with patient's daughter [name] regarding referral I received from Dr. Paulita Fujita at [facility], have scheduled the patient to come in this Wednesday 4/28 to arrive by 1:30 to register and be seen by Cira Rue NP/Dr. Burr Medico.  Explained there is [place] parking for them to use if so desired.  She states her sister will be bringing her that day.  I explained my role as GI nurse navigator and she was given my direct phone number and encouraged to call with any questions or concerns.  She verbalized an understanding.

## 2019-05-11 NOTE — Progress Notes (Addendum)
[place]  [phone]) [phone] Fax:([phone]) [place] Note   Patient Care Team: Sandi Mariscal, MD as PCP - General (Internal Medicine) Debara Pickett Nadean Corwin, MD as PCP - Cardiology (Cardiology) Jonnie Finner, RN as Oncology Nurse Navigator Alla Feeling, NP as Nurse Practitioner (Nurse Practitioner) Truitt Merle, MD as Consulting Physician (Hematology) 05/12/2019  CHIEF COMPLAINTS/PURPOSE OF CONSULTATION:  Pancreatic neoplasm, referred by Dr. Paulita Fujita of [facility]  HISTORY OF PRESENTING ILLNESS:  Allison Vaughan 74 y.o. female with history of CAD, Afib on eliquis, HTN, COPD, IBS, recreational drug use is here because of newly diagnosed pancreatic neoplasm. She presented to ED on 12/21/2018 with 1 week h/o nausea/vomiting, weight loss, and generalized weakness and near syncope. EKG showed critical QT interval. Labs consistent with dehydration and hypokalemia. CT AP w contrast on 12/21/18 showed splenomegaly and dilatation of the distal pancreatic duct with abrupt cutoff at the level of the pancreatic body. On 12/24/18 she underwent MRI/MRCP which showed distention of the pancreatic duct similar to CT with a 3.5 cm infiltrating mass in the pancreatic body causing the pancreatic duct obstruction and marked atrophy of the pancreatic tail. There was no obvious direct vascular invasion or peripancreatic adenopathy. CA 19-9 on 12/26/18 elevated to 250. She underwent cardiac clearance for EUS which was delayed for various reasons. On 05/05/19 she underwent EUS by Dr. Paulita Fujita which showed an irregular hypoechoic mass in the pancreatic body measuring 24 mm x 17 mm with poorly defined borders. There were a few abnormal lymph nodes in the periapncreatic region, this was staged T3N1 by endosonographic criteria. Cytology showed papillary neoplasm suspicious for malignancy, differential includes well differentiated adenocarcinoma or other mucinous lesion such as intraductal papillary  mucinous neoplasm (IPMN) with dysplasia.   Socially, she lives with her daughter. She is not working. She is mostly independent with ADLs with help from family, she does not drive. She has insurance. 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 She smokes cigarettes daily x50 years, up to 2 PPD at most. She denies other drug use. She has 3 children, 2 daughters living and 1 so who is deceased from liver failure from drug-related Hep C. She has family history of lung cancer in a brother, possibly pancreas cancer in another brother, MGM with breast cancer and PGM with breast.   Today, she presents with one of her daughters. She is fatigued for past 6 months, she is out of bed most of the day, does light housework when she pushes herself. Appetite is good, she weighed 150-160 lbs a year and half ago, but has gained weight in last few months. Has mild intermittent nausea and chronic constipation. Denies bleeding. She rarely has abdominal pain and bloating, she gets full quickly; pain is mostly in her low back for at least 1 year. She takes 10 mg oxycodone daily for 4-5 years for arthritis. She denies fever, chills, infection. She has COPD, cough and dyspnea are at baseline. Not on oxygen. No dizziness lately after coming off certain BP meds.   MEDICAL HISTORY:  Past Medical History:  Diagnosis Date  . Accidental drug overdose   . Acute encephalopathy   . Acute on chronic respiratory failure with hypoxia ([place]) 03/01/2017  . Acute renal failure ([place]) 04/16/2014  . AKI (acute kidney injury) ([place]) 04/30/2014  . Altered mental status 06/27/2012  . Anxiety   . Atrial fibrillation ([place])   . CAD (coronary artery disease)   .  CAP (community acquired pneumonia) 04/18/2014  . Cardiomyopathy ([place])   . Chronic hypoxemic respiratory failure ([place]) 06/26/2012  . Chronic pain   . COPD exacerbation ([place]) 06/26/2012  . COPD with acute exacerbation ([place]) 03/01/2017  . DEPRESSIVE DISORDER, NOS 03/13/2006     Qualifier: Diagnosis of  By: Damita Dunnings MD, Phillip Heal    . Edema, peripheral   . Essential hypertension 04/30/2014  . Fall at home, initial encounter 03/01/2017  . GASTROESOPHAGEAL REFLUX, NO ESOPHAGITIS 03/13/2006   Qualifier: Diagnosis of  By: Damita Dunnings MD, Phillip Heal    . HYPERCHOLESTEROLEMIA 03/13/2006   Qualifier: Diagnosis of  By: Damita Dunnings MD, Phillip Heal    . Hyperlipidemia   . HYPERTENSION, BENIGN SYSTEMIC 03/13/2006   Qualifier: Diagnosis of  By: Damita Dunnings MD, Phillip Heal    . Hypokalemia 04/30/2014  . Hyponatremia 04/30/2014  . Hypotension 04/30/2014  . Hypoxemia 06/26/2012  . Hypoxia 03/26/2018  . Irritable bowel syndrome 03/13/2006   Qualifier: Diagnosis of  By: Damita Dunnings MD, Phillip Heal    . Lung nodule 04/16/2014  . MENOPAUSAL SYNDROME 03/13/2006   Qualifier: Diagnosis of  By: Damita Dunnings MD, Phillip Heal    . Mood disorder [facility]) 03/13/2006   Qualifier: Diagnosis of  By: Damita Dunnings MD, Phillip Heal    . 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 Nausea 04/30/2014  . Nausea vomiting and diarrhea 04/16/2014  . OSTEOARTHRITIS, MULTI SITES 03/13/2006   Qualifier: Diagnosis of  By: Damita Dunnings MD, Phillip Heal    . Pulmonary nodule 04/30/2014  . Respiratory failure ([place]) 01/22/2017  . TOBACCO DEPENDENCE 03/13/2006   Qualifier: Diagnosis of  By: Damita Dunnings MD, Phillip Heal    . Weight loss     SURGICAL HISTORY: Past Surgical History:  Procedure Laterality Date  . ESOPHAGOGASTRODUODENOSCOPY (EGD) WITH PROPOFOL N/A 05/05/2019   Procedure: ESOPHAGOGASTRODUODENOSCOPY (EGD) WITH PROPOFOL;  Surgeon: Arta Silence, MD;  Location: [facility] END[facility]COPY;  Service: Endoscopy;  Laterality: N/A;  . EYE SURGERY    . FINE NEEDLE ASPIRATION N/A 05/05/2019   Procedure: FINE NEEDLE ASPIRATION (FNA) LINEAR;  Surgeon: Arta Silence, MD;  Location: [facility];  Service: Endoscopy;  Laterality: N/A;  . ROTATOR CUFF REPAIR Right 2012  . UPPER ESOPHAGEAL ENDOSCOPIC ULTRASOUND (EUS) N/A 05/05/2019   Procedure: UPPER ESOPHAGEAL ENDOSCOPIC ULTRASOUND (EUS) with FNA;  Surgeon: Arta Silence,  MD;  Location: [facility];  Service: Endoscopy;  Laterality: N/A;    SOCIAL HISTORY: Social History   Socioeconomic History  . Marital status: Single    Spouse name: Not on file  . Number of children: Not on file  . Years of education: Not on file  . Highest education level: Not on file  Occupational History  . Not on file  Tobacco Use  . Smoking status: Current Every Day Smoker    Packs/day: 1.00    Types: Cigarettes  . Smokeless tobacco: Never Used  . Tobacco comment: states she is trying to quit  Substance and Sexual Activity  . Alcohol use: No    Comment: not in a long time  . Drug use: No  . Sexual activity: Not on file  Other Topics Concern  . Not on file  Social History Narrative  . Not on file   Social Determinants of Health   Financial Resource Strain:   . Difficulty of Paying Living Expenses:   Food Insecurity:   . Worried About Charity fundraiser in the Last Year:   . Arboriculturist in the Last Year:   Transportation Needs:   . Film/video editor (Medical):   Marland Kitchen  Lack of Transportation (Non-Medical):   Physical Activity:   . Days of Exercise per Week:   . Minutes of Exercise per Session:   Stress:   . Feeling of Stress :   Social Connections:   . Frequency of Communication with Friends and Family:   . Frequency of Social Gatherings with Friends and Family:   . Attends Religious Services:   . Active Member of Clubs or Organizations:   . Attends Archivist Meetings:   Marland Kitchen Marital Status:   Intimate Partner Violence:   . Fear of Current or Ex-Partner:   . Emotionally Abused:   Marland Kitchen Physically Abused:   . Sexually Abused:     FAMILY HISTORY: Family History  Problem Relation Age of Onset  . CAD Father   . Lung cancer Brother   . Healthy Son   . Healthy Daughter   . Healthy Daughter     ALLERGIES:  has No Known Allergies.  MEDICATIONS:  Current Outpatient Medications  Medication Sig Dispense Refill  . albuterol (PROVENTIL  HFA;VENTOLIN HFA) 108 (90 BASE) MCG/ACT inhaler Inhale 2 puffs into the lungs every 6 (six) hours as needed for wheezing. 1 Inhaler 2  . albuterol (PROVENTIL) (2.5 MG/3ML) 0.083% nebulizer solution Take 3 mLs (2.5 mg total) by nebulization every 2 (two) hours as needed for wheezing. 75 mL 12  . apixaban (ELIQUIS) 5 MG TABS tablet Take 1 tablet (5 mg total) by mouth 2 (two) times daily. 180 tablet 3  . Aspirin-Salicylamide-Caffeine (BC HEADACHE PO) Take 1 packet by mouth 2 (two) times daily as needed (headaches).     Marland Kitchen atorvastatin (LIPITOR) 10 MG tablet Take 1 tablet (10 mg total) by mouth at bedtime. 30 tablet 0  . FLUoxetine (PROZAC) 40 MG capsule Take 40 mg by mouth daily.    . fluticasone (FLONASE) 50 MCG/ACT nasal spray Place 2 sprays into both nostrils daily. (Patient taking differently: Place 2 sprays into both nostrils daily as needed for rhinitis. ) 16 g 0  . ibuprofen (ADVIL) 200 MG tablet Take 400 mg by mouth every 6 (six) hours as needed for moderate pain.    Marland Kitchen LORazepam (ATIVAN) 0.5 MG tablet Take 0.5 mg by mouth at bedtime.     . Nutritional Supplements (ENSURE ENLIVE PO) Take 237 mLs by mouth every other day.    Marland Kitchen omeprazole (PRILOSEC) 40 MG capsule Take 40 mg by mouth in the morning and at bedtime.    . Oxycodone HCl 10 MG TABS Take 10 mg by mouth 4 (four) times daily as needed (pain).     . Phenylephrine-Acetaminophen (SINUS + HEADACHE PO) Take 1 tablet by mouth daily as needed (sinus headaches).    . potassium chloride (KLOR-CON) 10 MEQ tablet Take 10 mEq by mouth daily.    Marland Kitchen umeclidinium bromide (INCRUSE ELLIPTA) 62.5 MCG/INH AEPB Inhale 1 puff into the lungs daily. 30 each 0  . losartan (COZAAR) 25 MG tablet Take 1 tablet (25 mg total) by mouth daily. 90 tablet 3  . metoprolol succinate (TOPROL-XL) 25 MG 24 hr tablet Take 2 tablets (50 mg total) by mouth daily. Take with or immediately following a meal. 90 tablet 3  . nitroGLYCERIN (NITROSTAT) 0.4 MG SL tablet Place 1 tablet (0.4  mg total) under the tongue every 5 (five) minutes as needed for chest pain. 90 tablet 3   No current facility-administered medications for this visit.    REVIEW OF SYSTEMS:   Constitutional: Denies fevers, chills or abnormal night sweats (+)  fatigue (+) weight loss initially, now gaining Eyes: Denies blurriness of vision, double vision or watery eyes Ears, nose, mouth, throat, and face: Denies mucositis or sore throat Respiratory: Denies wheezes (+) Cough, dyspnea COPD Cardiovascular: Denies palpitation, chest discomfort or lower extremity swelling Gastrointestinal:  Denies vomiting, diarrhea, hematochezia, melena, heartburn or change in bowel habits (+) intermittent nausea (+) chronic constipation (+) bloating (+) early satiety (+) rare abdominal pain  Skin: Denies abnormal skin rashes Lymphatics: Denies new lymphadenopathy or easy bruising Neurological:Denies numbness, tingling or new weaknesses Behavioral/Psych: Mood is stable, no new changes  (+) controlled anxiety/depression MSK: (+) low back pain at least 1 year (+) arthritis  All other systems were reviewed with the patient and are negative.  PHYSICAL EXAMINATION: ECOG PERFORMANCE STATUS: 1-2  Vitals:   05/12/19 1353  BP: (!) 173/71  Pulse: 79  Resp: 18  Temp: 98.5 F (36.9 C)  SpO2: 100%   Filed Weights   05/12/19 1353  Weight: 113 lb 6.4 oz (51.4 kg)    GENERAL:alert, no distress and comfortable SKIN: no rash  EYES:  sclera clear NECK: without mass LUNGS: distant breath sounds with normal breathing effort HEART: regular rate & rhythm, no lower extremity edema ABDOMEN: abdomen soft and normal bowel sounds. Mild epigastric tenderness  Musculoskeletal:no cyanosis of digits and no clubbing  PSYCH: alert & oriented x 3 with fluent speech NEURO: no focal motor/sensory deficits  LABORATORY DATA:  I have reviewed the data as listed CBC Latest Ref Rng & Units 12/22/2018 12/21/2018 12/21/2018  WBC 4.0 - 10.5 K/uL 10.0 -  4.2  Hemoglobin 12.0 - 15.0 g/dL 15.0 13.3 13.7  Hematocrit 36.0 - 46.0 % 41.7 39.0 38.6  Platelets 150 - 400 K/uL 343 - 271   CMP Latest Ref Rng & Units 12/26/2018 12/25/2018 12/24/2018  Glucose 70 - 99 mg/dL 102(H) 100(H) 103(H)  BUN 8 - 23 mg/dL 14 23 16   Creatinine 0.44 - 1.00 mg/dL 0.90 1.38(H) 1.08(H)  Sodium 135 - 145 mmol/L 135 133(L) 128(L)  Potassium 3.5 - 5.1 mmol/L 3.3(L) 4.2 4.3  Chloride 98 - 111 mmol/L 104 102 94(L)  CO2 22 - 32 mmol/L 21(L) 23 23  Calcium 8.9 - 10.3 mg/dL 8.6(L) 8.2(L) 8.0(L)  Total Protein 6.5 - 8.1 g/dL - - -  Total Bilirubin 0.3 - 1.2 mg/dL - - -  Alkaline Phos 38 - 126 U/L - - -  AST 15 - 41 U/L - - -  ALT 0 - 44 U/L - - -    RADIOGRAPHIC STUDIES: I have personally reviewed the radiological images as listed and agreed with the findings in the report. No results found.  ASSESSMENT & PLAN: 74 yo female with   1. Pancreatic neoplasm, T3N1Mx -we reviewed her work up in detail with the patient and family, inlcuding imaging, EUS, and path.  -she presented with gradual weight loss and abdominal discomfort with low back pain for 6-12 months, CA 19-9 is elevated to 250. 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 Path shows papillary neoplasm suspicious for malignancy, the differential includes well differentiated adeno vs IPMN. We discussed the limitations of FNA -She has an appointment with Dr. Barry Dienes scheduled for 05/24/19.  -We will obtain CT chest to r/o distant metastasis, given her smoking history and elevated CA 19-9.  -  We discussed the typical aggressive nature of adenocarcinoma vs the more indolent course of IPMN. We discussed differences in therapeutic approach -since her diagnosis is not definative at  this point, we prefer her to proceed with surgery if she is a candidate, for final path. Due to her age, COPD and other comorbidities, she may not be a surgical candidate.  -We discussed if there is a component of adenocarcinoma on final path, we would likely offer adjuvant chemotherapy to reduce the recurrence risk if she recovers well and can tolerate chemo -we also discussed possible referral to radiation oncology if needed -we will present her case in our GI tumor board next week, and call her with recommendations.  -If she proceeds with surgery first, we will see her after that. -Will obtain baseline labs when she returns for CT chest next week. -due to family history of cancer and her personal history, she qualifies for genetics. She is interested. I will refer her.  -F/u open for now   2. Abdominal bloating, weight loss, low back pain -her abdominal bloating/discomfort and weight loss are c/w malignancy, has been gradual over 6-12 months. - She weighed 150-160 lbs about 1.5 years ago. She attributes some of her weight loss to grieving her son's death this year. She has been able to gain some weight lately.  -her low back pain appears chronic, likely not entirely related to pancreatic mass -she is on long term oxycodone 10 mg q6h, managed by her PCP Dr. Nancy Fetter  3. COPD -controlled, not oxygen dependent  -on albuterol PRN  -She has 50 year history of smoking 1-2PPD, I strongly encouraged her to quit smoking, especially as she is being considered for surgical resection. She is willing to try to quit  -May require cardiopulmonary clearance before pancreas surgery   4. Anxiety and depression -Chronic, controlled; managed by PCP  -Mood is stable on fluoxetine and ativan at bedtime   5. Afib, CAD, HTN -followed by PCP; on apixaban and atorvastatin -in past few months stopped losartan and metoprolol due to low BP, dizziness, and disorientation   PLAN: -Medical record reviewed -Discuss case  in GI tumor board next week, call with recommendations  -Proceed with surgery consult with Dr. Barry Dienes on 05/24/19  -Lab and CT chest in 1-2 weeks  -referral to genetics  -F/u pending tumor board rec and surgery plan    Orders Placed This Encounter  Procedures  . CT Chest Wo Contrast    Standing Status:   Future    Standing Expiration Date:   05/11/2020    Order Specific Question:   ** REASON FOR EXAM (FREE TEXT)    Answer:   pancreatic neoplasm, adeno vs IPMN, current smoker. r/o distant metastasis    Order Specific Question:   Preferred imaging location?    Answer:   [facility]    Order Specific Question:   Radiology Contrast Protocol - do NOT remove file path    Answer:   [email or web address]  . CBC with Differential ([facility] Only)    Standing Status:   Standing    Number of Occurrences:   20    Standing Expiration Date:   05/11/2020  . CMP ([place] only)    Standing Status:   Standing    Number of Occurrences:   20    Standing Expiration Date:   05/11/2020  . CA 19.9    Standing Status:   Standing    Number of Occurrences:   20  Standing Expiration Date:   05/11/2020  . Ambulatory referral to Genetics    Referral Priority:   Routine    Referral Type:   Consultation    Referral Reason:   Specialty Services Required    Number of Visits Requested:   1    All questions were answered. The patient knows to call the clinic with any problems, questions or concerns.     Alla Feeling, NP 05/12/2019   Addendum  I have seen the patient, examined her. I agree with the assessment and and plan and have edited the notes.   Allison Vaughan is a 74 year old Caucasian female with past medical history of heavy smoking and alcohol drinking, multiple comorbidities including hypertension, coronary artery disease, AF, COPD, presented with worsening mid-low back pain for several month, and significant weight loss. Her CT scan from June 2020 showed  persistent pancreatic duct dilatation, subsequent abdominal MRI/MRCP in December 2020 showed a 3.5 cm infiltrating pancreatic body mass.  EUS and a biopsy was not done until recently which showed papillary neoplasm, suspicious for malignancy, differential diagnosis includes well-differentiated adenocarcinoma or IPMN with dysplasia.  I discussed the above with patient and her daughter in details.  Her tumor marker CA 19.9 was also significantly elevated.  This is clinically suspicious for pancreatic adenocarcinoma, possibly arising from IPMN. I will present her case in our GI tumor conference next week, and complete her staging with CT chest, and also repeat her labs.  Will consider repeating biopsy, will proceed with surgical resection directly.  I briefly discussed the role of adjuvant chemotherapy if this is adenocarcinoma.  She voiced good understanding, and agrees with the plan.  She is scheduled to see Dr. Barry Dienes next week.  Truitt Merle  05/12/2019

## 2019-05-12 ENCOUNTER — Encounter: Payer: Self-pay | Admitting: Nurse Practitioner

## 2019-05-12 ENCOUNTER — Inpatient Hospital Stay: Payer: Medicare HMO | Attending: Nurse Practitioner | Admitting: Nurse Practitioner

## 2019-05-12 ENCOUNTER — Other Ambulatory Visit: Payer: Self-pay

## 2019-05-12 VITALS — BP 173/71 | HR 79 | Temp 98.5°F | Resp 18 | Ht 61.0 in | Wt 113.4 lb

## 2019-05-12 DIAGNOSIS — I429 Cardiomyopathy, unspecified: Secondary | ICD-10-CM | POA: Insufficient documentation

## 2019-05-12 DIAGNOSIS — B1920 Unspecified viral hepatitis C without hepatic coma: Secondary | ICD-10-CM | POA: Insufficient documentation

## 2019-05-12 DIAGNOSIS — I4891 Unspecified atrial fibrillation: Secondary | ICD-10-CM | POA: Diagnosis not present

## 2019-05-12 DIAGNOSIS — R634 Abnormal weight loss: Secondary | ICD-10-CM | POA: Insufficient documentation

## 2019-05-12 DIAGNOSIS — I10 Essential (primary) hypertension: Secondary | ICD-10-CM | POA: Diagnosis not present

## 2019-05-12 DIAGNOSIS — Z79899 Other long term (current) drug therapy: Secondary | ICD-10-CM | POA: Diagnosis not present

## 2019-05-12 DIAGNOSIS — J449 Chronic obstructive pulmonary disease, unspecified: Secondary | ICD-10-CM | POA: Diagnosis not present

## 2019-05-12 DIAGNOSIS — K8689 Other specified diseases of pancreas: Secondary | ICD-10-CM

## 2019-05-12 DIAGNOSIS — F329 Major depressive disorder, single episode, unspecified: Secondary | ICD-10-CM | POA: Diagnosis not present

## 2019-05-12 DIAGNOSIS — F419 Anxiety disorder, unspecified: Secondary | ICD-10-CM | POA: Diagnosis not present

## 2019-05-12 DIAGNOSIS — R978 Other abnormal tumor markers: Secondary | ICD-10-CM | POA: Insufficient documentation

## 2019-05-12 NOTE — Progress Notes (Signed)
Met with patient and her daughter [name] at their initial medical oncology appointment with Cira Rue NP and Dr. Burr Medico.  I explained my role as GI nurse navigator and they were given my card with my direct number and encouraged to call with any questions or concerns.  I also gave them two copies of the book "An Overview of Pancreatic Cancer".  They verbalized an understanding of the information given and their questions were answered.

## 2019-05-13 ENCOUNTER — Telehealth: Payer: Self-pay | Admitting: Hematology

## 2019-05-13 NOTE — Telephone Encounter (Signed)
Scheduled appt per 4/29 sch message - unable to reach pt . Left message with appt date and time

## 2019-05-19 ENCOUNTER — Other Ambulatory Visit: Payer: Self-pay | Admitting: Hematology

## 2019-05-19 ENCOUNTER — Inpatient Hospital Stay: Payer: Medicare HMO | Attending: Nurse Practitioner

## 2019-05-19 ENCOUNTER — Other Ambulatory Visit: Payer: Self-pay

## 2019-05-19 DIAGNOSIS — K8689 Other specified diseases of pancreas: Secondary | ICD-10-CM

## 2019-05-19 DIAGNOSIS — B1920 Unspecified viral hepatitis C without hepatic coma: Secondary | ICD-10-CM | POA: Insufficient documentation

## 2019-05-19 DIAGNOSIS — R978 Other abnormal tumor markers: Secondary | ICD-10-CM | POA: Insufficient documentation

## 2019-05-19 DIAGNOSIS — C259 Malignant neoplasm of pancreas, unspecified: Secondary | ICD-10-CM

## 2019-05-19 NOTE — Progress Notes (Signed)
Left voice message for patient regarding results from Tumor Board conference this morning.  Recommendation was to also obtain CT abdomen with contrast which we have added to her appointment on 5/7 at 2 pm with [facility] for CT Chest.  Instructed her she will need to pick up two bottles of oral prep to drink one bottle at 12 and second bottle at 1:00 prior to her scan, NPO for 4 hours prior.  I asked that she call me back to confirm receipt of this message and that she understands the prep instructions.

## 2019-05-20 NOTE — Progress Notes (Signed)
Left second voice message for patient regarding CT scan for 5/7.  I informed her she will also need labs drawn at 12:45 at [facility] prior to CT scan and need to review the prep for this scan.  I asked that she please return my phone asap.

## 2019-05-21 ENCOUNTER — Ambulatory Visit (HOSPITAL_COMMUNITY): Admission: RE | Admit: 2019-05-21 | Payer: Medicare HMO | Source: Ambulatory Visit

## 2019-05-21 ENCOUNTER — Inpatient Hospital Stay: Payer: Medicare HMO

## 2019-05-21 NOTE — Progress Notes (Signed)
Patient's daughter [name] calls stating their car is broken down and need to r/s CT scan for today, I called Central Scheduling and r/s for Friday 5/14 at 3:00 to arrive at 2:30 at [facility], labs at [facility] at 2:00 prior to scan, she will need to pick up 2 bottles of oral prep and they were instructed on how to drink, also NPO 4 hours prior.  She verbalized an understanding and states they have an appointment at [place] on Monday 5/10.

## 2019-05-24 ENCOUNTER — Inpatient Hospital Stay: Payer: Medicare HMO | Admitting: Genetic Counselor

## 2019-05-24 ENCOUNTER — Inpatient Hospital Stay: Payer: Medicare HMO

## 2019-05-24 ENCOUNTER — Other Ambulatory Visit: Payer: Self-pay | Admitting: Genetic Counselor

## 2019-05-24 DIAGNOSIS — C259 Malignant neoplasm of pancreas, unspecified: Secondary | ICD-10-CM

## 2019-05-25 ENCOUNTER — Other Ambulatory Visit: Payer: Self-pay | Admitting: Nurse Practitioner

## 2019-05-25 DIAGNOSIS — C259 Malignant neoplasm of pancreas, unspecified: Secondary | ICD-10-CM

## 2019-05-28 ENCOUNTER — Encounter (INDEPENDENT_AMBULATORY_CARE_PROVIDER_SITE_OTHER): Payer: Self-pay

## 2019-05-28 ENCOUNTER — Inpatient Hospital Stay: Payer: Medicare HMO

## 2019-05-28 ENCOUNTER — Ambulatory Visit (HOSPITAL_COMMUNITY)
Admission: RE | Admit: 2019-05-28 | Discharge: 2019-05-28 | Disposition: A | Discharge status: Home / Self Care | Payer: Medicare HMO | Source: Ambulatory Visit | Attending: Hematology | Admitting: Hematology

## 2019-05-28 ENCOUNTER — Other Ambulatory Visit: Payer: Self-pay

## 2019-05-28 DIAGNOSIS — K8689 Other specified diseases of pancreas: Secondary | ICD-10-CM | POA: Diagnosis present

## 2019-05-28 DIAGNOSIS — B1920 Unspecified viral hepatitis C without hepatic coma: Secondary | ICD-10-CM | POA: Diagnosis not present

## 2019-05-28 DIAGNOSIS — R978 Other abnormal tumor markers: Secondary | ICD-10-CM | POA: Diagnosis not present

## 2019-05-28 DIAGNOSIS — C259 Malignant neoplasm of pancreas, unspecified: Secondary | ICD-10-CM | POA: Insufficient documentation

## 2019-05-28 LAB — CBC WITH DIFFERENTIAL (CANCER CENTER ONLY)
Abs Immature Granulocytes: 0.02 10*3/uL (ref 0.00–0.07)
Basophils Absolute: 0 10*3/uL (ref 0.0–0.1)
Basophils Relative: 1 %
Eosinophils Absolute: 0.2 10*3/uL (ref 0.0–0.5)
Eosinophils Relative: 4 %
HCT: 37.5 % (ref 36.0–46.0)
Hemoglobin: 12.5 g/dL (ref 12.0–15.0)
Immature Granulocytes: 0 %
Lymphocytes Relative: 25 %
Lymphs Abs: 1.1 10*3/uL (ref 0.7–4.0)
MCH: 29.6 pg (ref 26.0–34.0)
MCHC: 33.3 g/dL (ref 30.0–36.0)
MCV: 88.7 fL (ref 80.0–100.0)
Monocytes Absolute: 0.4 10*3/uL (ref 0.1–1.0)
Monocytes Relative: 9 %
Neutro Abs: 2.8 10*3/uL (ref 1.7–7.7)
Neutrophils Relative %: 61 %
Platelet Count: 194 10*3/uL (ref 150–400)
RBC: 4.23 MIL/uL (ref 3.87–5.11)
RDW: 12.2 % (ref 11.5–15.5)
WBC Count: 4.6 10*3/uL (ref 4.0–10.5)
nRBC: 0 % (ref 0.0–0.2)

## 2019-05-28 LAB — CMP (CANCER CENTER ONLY)
ALT: 7 U/L (ref 0–44)
AST: 13 U/L — ABNORMAL LOW (ref 15–41)
Albumin: 3.8 g/dL (ref 3.5–5.0)
Alkaline Phosphatase: 137 U/L — ABNORMAL HIGH (ref 38–126)
Anion gap: 10 (ref 5–15)
BUN: 14 mg/dL (ref 8–23)
CO2: 28 mmol/L (ref 22–32)
Calcium: 9.3 mg/dL (ref 8.9–10.3)
Chloride: 100 mmol/L (ref 98–111)
Creatinine: 1.16 mg/dL — ABNORMAL HIGH (ref 0.44–1.00)
GFR, Est AFR Am: 53 mL/min — ABNORMAL LOW (ref >60)
GFR, Estimated: 46 mL/min — ABNORMAL LOW (ref >60)
Glucose, Bld: 88 mg/dL (ref 70–99)
Potassium: 4.4 mmol/L (ref 3.5–5.1)
Sodium: 138 mmol/L (ref 135–145)
Total Bilirubin: 0.4 mg/dL (ref 0.3–1.2)
Total Protein: 6.9 g/dL (ref 6.5–8.1)

## 2019-05-28 MED ORDER — IOHEXOL 300 MG/ML  SOLN
80.0000 mL | Freq: Once | INTRAMUSCULAR | Status: AC | PRN
  Administered 2019-05-28: 80 mL via INTRAVENOUS

## 2019-05-28 MED ORDER — SODIUM CHLORIDE (PF) 0.9 % IJ SOLN
INTRAMUSCULAR | Status: AC
  Filled 2019-05-28: qty 50

## 2019-05-29 LAB — CANCER ANTIGEN 19-9: CA 19-9: 342 U/mL — ABNORMAL HIGH (ref 0–35)

## 2019-06-03 ENCOUNTER — Telehealth: Payer: Self-pay | Admitting: *Deleted

## 2019-06-03 NOTE — Telephone Encounter (Signed)
   [facility] Pre-operative Risk Assessment    HEARTCARE STAFF: - Please ensure there is not already an duplicate clearance open for this procedure - Under Visit Info/Reason for Call, type in Other and utilize the format Clearance MM/DD/YY or Clearance TBD  Request for surgical clearance: SEE OV NOTE FROM Roby Lofts, PAC 01/2019 GIVING CLEARANCE. OFFICIAL CLEARANCE REQUEST SENT TODAY ASKING FOR INSTRUCTIONS ON ANTICOAG  1. What type of surgery is being performed? PANCREATECTOMY    2. When is this surgery scheduled? TBD   3. What type of clearance is required (medical clearance vs. Pharmacy clearance to hold med vs. Both)? PHARM  4. Are there any medications that need to be held prior to surgery and how long? ELIQUIS   5. Practice name and name of physician performing surgery? [facility]; DR. Savage   6. What is the office phone number? [phone]   7.   What is the office fax number?  [place], CMA  8.   Anesthesia type (None, local, MAC, general) ? GENERAL   Julaine Hua 06/03/2019, 10:24 AM  _________________________________________________________________   (provider comments below)

## 2019-06-03 NOTE — Telephone Encounter (Signed)
Patient with diagnosis of afib on Eliquis for anticoagulation.    Procedure: pancreatectomy Date of procedure: TBD  CHADS2-VASc score of 48 (age x2, sex, CHF, HTN, CAD)  CrCl 33.65mL/min Platelet count 194K  Per office protocol, patient can hold Eliquis for 2-3 days prior to procedure.

## 2019-06-07 NOTE — Telephone Encounter (Signed)
   Primary Cardiologist: Pixie Casino, MD  Chart reviewed as part of pre-operative protocol coverage. Given past medical history and time since last visit, based on ACC/AHA guidelines, Allison Vaughan would be at acceptable risk for the planned procedure without further cardiovascular testing.   Patient with diagnosis of afib on Eliquis for anticoagulation.    Procedure: pancreatectomy Date of procedure: TBD  CHADS2-VASc score of 73 (age x2, sex, CHF, HTN, CAD)  CrCl 33.72mL/min Platelet count 194K  Per office protocol, patient can hold Eliquis for 2-3 days prior to procedure.   I will route this recommendation to the requesting party via Epic fax function and remove from pre-op pool.  Please call with questions.  Jossie Ng. Cleaver NP-C    06/07/2019, 10:35 AM [address] Office [phone] Fax [phone]

## 2019-06-09 ENCOUNTER — Other Ambulatory Visit: Payer: Self-pay

## 2019-06-09 NOTE — Progress Notes (Signed)
Attempted to contact patient or her daughter [name], no answer and no voice mail set up regarding having patient to come in tomorrow 5/27 to be seen by Cira Rue NP either at 9:45 or 1:15 to discuss results of CT scan and plan.

## 2019-06-10 ENCOUNTER — Institutional Professional Consult (permissible substitution): Payer: Medicare HMO | Admitting: Internal Medicine

## 2019-06-18 ENCOUNTER — Telehealth: Payer: Self-pay | Admitting: Hematology

## 2019-06-18 NOTE — Telephone Encounter (Signed)
Scheduled appt per 6/4 sch message - unable to reach pt . Left message and mailed reminder letter.

## 2019-06-24 DIAGNOSIS — D490 Neoplasm of unspecified behavior of digestive system: Secondary | ICD-10-CM | POA: Insufficient documentation

## 2019-06-24 NOTE — Progress Notes (Addendum)
[place]   [phone] [phone]   Clinic Follow up Note   Patient Care Team: Sandi Mariscal, MD as PCP - General (Internal Medicine) Debara Pickett Nadean Corwin, MD as PCP - Cardiology (Cardiology) Jonnie Finner, RN as Oncology Nurse Navigator Alla Feeling, NP as Nurse Practitioner (Nurse Practitioner) Truitt Merle, MD as Consulting Physician (Hematology) 06/28/2019  CHIEF COMPLAINT: F/u pancreatic neoplasm   SUMMARY OF ONCOLOGIC HISTORY: -Presented to ED on 12/21/2018 with 1 week h/o nausea/vomiting, weight loss, and generalized weakness and near syncope.  -CT AP w contrast on 12/21/18 showed splenomegaly and dilatation of the distal pancreatic duct with abrupt cutoff at the level of the pancreatic body. - MRI/MRCP on 12/24/18 distention of the pancreatic duct similar to CT with a 3.5 cm infiltrating mass in the pancreatic body causing the pancreatic duct obstruction and marked atrophy of the pancreatic tail. There was no obvious direct vascular invasion or peripancreatic adenopathy.  -Baseline CA 19-9 on 12/26/18 elevated to 250 -EUS delayed for various reasons, 05/05/19 EUS by Dr. Paulita Fujita showed an irregular hypoechoic mass in the pancreatic body measuring 24 mm x 17 mm with poorly defined borders. There were a few abnormal lymph nodes in the periapncreatic region, this was staged T3N1 by endosonographic criteria.  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   CURRENT THERAPY: None to date   INTERVAL HISTORY: Allison Vaughan returns for follow up. She was last seen 05/12/19 for initial consultation. In the interval her case was discussed in GI tumor board, she was seen by Dr. Barry Dienes, and repeated CT chest/abdomen with pancreatic protocol which showed increase in size of the mass arising from the body  and tail of the pancreas as well as progressive vascular involvement of the portal vein, portal venous confluence, splenic vein and celiac artery. No distant metastasis. We have had difficulty reaching her for an appointment since then.   Today, she presents with another daughter. She has been feeling OK since last visit. Energy remains adequate for ADLs. She remains independent. Eating and drinking well. She has some intermittent abdominal pain that fluctuates, takes oxycodone up to TID, this is an old prescription. Denies n/v/c/d. She missed pulmonology appointment recently due to vomiting the night before, has not vomited since. Her baseline cough is unchanged, denies fever, chills, chest pain, dyspnea, or new issues.     MEDICAL HISTORY:  Past Medical History:  Diagnosis Date  . Accidental drug overdose   . Acute encephalopathy   . Acute on chronic respiratory failure with hypoxia ([place]) 03/01/2017  . Acute renal failure ([place]) 04/16/2014  . AKI (acute kidney injury) ([place]) 04/30/2014  . Altered mental status 06/27/2012  . Anxiety   . Atrial fibrillation ([place])   . CAD (coronary artery disease)   . CAP (community acquired pneumonia) 04/18/2014  . Cardiomyopathy ([place])   . Chronic hypoxemic respiratory failure ([place]) 06/26/2012  . Chronic pain   . COPD exacerbation ([place]) 06/26/2012  . COPD with acute exacerbation ([place]) 03/01/2017  . DEPRESSIVE DISORDER, NOS 03/13/2006   Qualifier: Diagnosis of  By: Damita Dunnings MD, Phillip Heal    . Edema, peripheral   . Essential hypertension 04/30/2014  . Fall at home, initial encounter 03/01/2017  . GASTROESOPHAGEAL REFLUX, NO ESOPHAGITIS 03/13/2006   Qualifier: Diagnosis of  By: Damita Dunnings MD, Phillip Heal    . HYPERCHOLESTEROLEMIA 03/13/2006   Qualifier: Diagnosis of  By:  Damita Dunnings MD, Phillip Heal    . Hyperlipidemia   . HYPERTENSION, BENIGN SYSTEMIC 03/13/2006   Qualifier: Diagnosis of  By: Damita Dunnings MD, Phillip Heal    . Hypokalemia 04/30/2014  . Hyponatremia 04/30/2014  . Hypotension 04/30/2014  .  Hypoxemia 06/26/2012  . Hypoxia 03/26/2018  . Irritable bowel syndrome 03/13/2006   Qualifier: Diagnosis of  By: Damita Dunnings MD, Phillip Heal    . Lung nodule 04/16/2014  . MENOPAUSAL SYNDROME 03/13/2006   Qualifier: Diagnosis of  By: Damita Dunnings MD, Phillip Heal    . Mood disorder [facility]) 03/13/2006   Qualifier: Diagnosis of  By: Damita Dunnings MD, Phillip Heal    . 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 Nausea 04/30/2014  . Nausea vomiting and diarrhea 04/16/2014  . OSTEOARTHRITIS, MULTI SITES 03/13/2006   Qualifier: Diagnosis of  By: Damita Dunnings MD, Phillip Heal    . Pulmonary nodule 04/30/2014  . Respiratory failure ([place]) 01/22/2017  . TOBACCO DEPENDENCE 03/13/2006   Qualifier: Diagnosis of  By: Damita Dunnings MD, Phillip Heal    . Weight loss     SURGICAL HISTORY: Past Surgical History:  Procedure Laterality Date  . ESOPHAGOGASTRODUODENOSCOPY (EGD) WITH PROPOFOL N/A 05/05/2019   Procedure: ESOPHAGOGASTRODUODENOSCOPY (EGD) WITH PROPOFOL;  Surgeon: Arta Silence, MD;  Location: [facility] ENDOSCOPY;  Service: Endoscopy;  Laterality: N/A;  . EYE SURGERY    . FINE NEEDLE ASPIRATION N/A 05/05/2019   Procedure: FINE NEEDLE ASPIRATION (FNA) LINEAR;  Surgeon: Arta Silence, MD;  Location: [facility] ENDOSCOPY;  Service: Endoscopy;  Laterality: N/A;  . ROTATOR CUFF REPAIR Right 2012  . UPPER ESOPHAGEAL ENDOSCOPIC ULTRASOUND (EUS) N/A 05/05/2019   Procedure: UPPER ESOPHAGEAL ENDOSCOPIC ULTRASOUND (EUS) with FNA;  Surgeon: Arta Silence, MD;  Location: [facility];  Service: Endoscopy;  Laterality: N/A;    I have reviewed the social history and family history with the patient and they are unchanged from previous note.  ALLERGIES:  has No Known Allergies.  MEDICATIONS:  Current Outpatient Medications  Medication Sig Dispense Refill  . albuterol (PROVENTIL HFA;VENTOLIN HFA) 108 (90 BASE) MCG/ACT inhaler Inhale 2 puffs into the lungs every 6 (six) hours as needed for wheezing. 1 Inhaler 2  . albuterol (PROVENTIL) (2.5 MG/3ML) 0.083% nebulizer solution Take 3 mLs (2.5 mg  total) by nebulization every 2 (two) hours as needed for wheezing. 75 mL 12  . apixaban (ELIQUIS) 5 MG TABS tablet Take 1 tablet (5 mg total) by mouth 2 (two) times daily. 180 tablet 3  . Aspirin-Salicylamide-Caffeine (BC HEADACHE PO) Take 1 packet by mouth 2 (two) times daily as needed (headaches).     Marland Kitchen atorvastatin (LIPITOR) 10 MG tablet Take 1 tablet (10 mg total) by mouth at bedtime. 30 tablet 0  . FLUoxetine (PROZAC) 40 MG capsule Take 40 mg by mouth daily.    . fluticasone (FLONASE) 50 MCG/ACT nasal spray Place 2 sprays into both nostrils daily. (Patient taking differently: Place 2 sprays into both nostrils daily as needed for rhinitis. ) 16 g 0  . ibuprofen (ADVIL) 200 MG tablet Take 400 mg by mouth every 6 (six) hours as needed for moderate pain.    Marland Kitchen LORazepam (ATIVAN) 0.5 MG tablet Take 0.5 mg by mouth at bedtime.     . Nutritional Supplements (ENSURE ENLIVE PO) Take 237 mLs by mouth every other day.    Marland Kitchen omeprazole (PRILOSEC) 40 MG capsule Take 40 mg by mouth in the morning and at bedtime.    . Oxycodone HCl 10 MG TABS Take 10 mg by mouth 4 (four) times daily as needed (pain).     Marland Kitchen  Phenylephrine-Acetaminophen (SINUS + HEADACHE PO) Take 1 tablet by mouth daily as needed (sinus headaches).    . potassium chloride (KLOR-CON) 10 MEQ tablet Take 10 mEq by mouth daily.    Marland Kitchen umeclidinium bromide (INCRUSE ELLIPTA) 62.5 MCG/INH AEPB Inhale 1 puff into the lungs daily. 30 each 0  . losartan (COZAAR) 25 MG tablet Take 1 tablet (25 mg total) by mouth daily. 90 tablet 3  . metoprolol succinate (TOPROL-XL) 25 MG 24 hr tablet Take 2 tablets (50 mg total) by mouth daily. Take with or immediately following a meal. 90 tablet 3  . nitroGLYCERIN (NITROSTAT) 0.4 MG SL tablet Place 1 tablet (0.4 mg total) under the tongue every 5 (five) minutes as needed for chest pain. 90 tablet 3   No current facility-administered medications for this visit.    PHYSICAL EXAMINATION: ECOG PERFORMANCE STATUS:  1-2  Vitals:   06/28/19 1442 06/28/19 1453  BP: (!) 193/120 (!) 176/72  Pulse:    Resp:    Temp:    SpO2:     Filed Weights   06/28/19 1440  Weight: 111 lb 11.2 oz (50.7 kg)    GENERAL:alert, no distress and comfortable SKIN: no rash  EYES:  sclera clear NECK: without mass LUNGS: decreased, with normal breathing effort HEART: regular rate & rhythm, no lower extremity edema ABDOMEN: abdomen soft, non-tender and normal bowel sounds. Small soft tissue nodule just above the navel  NEURO: alert & oriented x 3 with fluent speech, normal gait  LABORATORY DATA:  I have reviewed the data as listed CBC Latest Ref Rng & Units 06/28/2019 05/28/2019 12/22/2018  WBC 4.0 - 10.5 K/uL 5.3 4.6 10.0  Hemoglobin 12.0 - 15.0 g/dL 11.6(L) 12.5 15.0  Hematocrit 36 - 46 % 34.5(L) 37.5 41.7  Platelets 150 - 400 K/uL 165 194 343     CMP Latest Ref Rng & Units 06/28/2019 05/28/2019 12/26/2018  Glucose 70 - 99 mg/dL 97 88 102(H)  BUN 8 - 23 mg/dL _0 Creatinine 0.44 - 1.00 mg/dL 1.08(H) 1.16(H) 0.90  Sodium 135 - 145 mmol/L 139 138 135  Potassium 3.5 - 5.1 mmol/L 4.4 4.4 3.3(L)  Chloride 98 - 111 mmol/L 103 100 104  CO2 22 - 32 mmol/L 25 28 21(L)  Calcium 8.9 - 10.3 mg/dL 9.0 9.3 8.6(L)  Total Protein 6.5 - 8.1 g/dL 6.6 6.9 -  Total Bilirubin 0.3 - 1.2 mg/dL 0.7 0.4 -  Alkaline Phos 38 - 126 U/L 142(H) 137(H) -  AST 15 - 41 U/L 13(L) 13(L) -  ALT 0 - 44 U/L 7 7 -      RADIOGRAPHIC STUDIES: I have personally reviewed the radiological images as listed and agreed with the findings in the report. No results found.   ASSESSMENT & PLAN: 74 yo female with   1. Pancreatic neoplasm, T3N1M0 -she presented with gradual weight loss and abdominal discomfort with low back pain for 6-12 months, CA 19-9 is elevated to 250.  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 Path showed papillary neoplasm suspicious for malignancy, the differential includes well differentiated adeno vs IPMN. We discussed the limitations of FNA -She had an appointment with Dr. Barry Dienes 05/24/19 who felt she was likely a surgical candidate pending no distant metastasis.  -Case was discussed in  GI tumor board and CT pancreatic protocol was recommended, done on 05/28/19 which unfortunately showed interval increase in size of poorly defined infiltrating pancreas tumor and progressive vascular involvement of the portal vein, portal venous confluence, splenic vein, and celiac artery.  CT chest was negative. I reviewed this with her and her daughter today.  -Unfortunately, due to progressive vascular involvement, she is not a surgical candidate.  -She has been stable since f/u 6 weeks ago. Weight and abdominal pain are stable. She functions mostly independently. Has family for support.  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 We are recommending repeat EUS for tissue diagnosis and fiducial marker placement. If path shows adeno, we may offer low dose chemo before RT. She is open to our recommendations.  -CBC now shows mild anemia hgb 11.6, stable alk phos elevation and CMP. CA 19-9 is pending. -We will see her back after EUS, to discuss path and finalize a treatment plan.   -The patient was seen with Dr. Burr Medico   2. Abdominal bloating, weight loss, low back pain -her abdominal bloating/discomfort and weight loss are c/w malignancy, has been gradual over 6-12 months. - She weighed 150-160 lbs about 1.5 years ago. She attributes some of her weight loss to grieving her son's death this year. She has been able to gain some weight lately.  -her low back pain appears chronic, likely not entirely related to pancreatic mass -she is on long term  oxycodone 10 mg q6h, managed by her PCP Dr. Nancy Fetter -pain and weight are stable overall   3. COPD -controlled, not oxygen dependent  -on albuterol PRN  -She has 50 year history of smoking 1-2PPD, I strongly encouraged her to quit smoking, especially as she is being considered for surgical resection. She is willing to try to quit  -missed recent appointment with pulmonology  -continues to smoke, counseled on cessation   4. Anxiety and depression -Chronic, controlled; managed by PCP  -Mood is stable on fluoxetine and ativan at bedtime   5. Afib, CAD, HTN -followed by PCP; on apixaban and atorvastatin -in past few months stopped losartan and metoprolol due to low BP, dizziness, and disorientation   PLAN: -Lab and CT reviewed -Repeat EUS for tissue diagnosis and fiducial placement  -Message sent to Dr. Rush Landmark, Dr. Ardis Hughs, and Dr. Lisbeth Renshaw  -F/u few days after EUS to review path and finalize treatment plan  All questions were answered. The patient knows to call the clinic with any problems, questions or concerns. No barriers to learning were detected.     Alla Feeling, NP 06/28/19   Addendum  I have seen the patient, examined her. I agree with the assessment and and plan and have edited the notes.   Allison Vaughan returns for follow up.  Her recent restaging CT scan unfortunately showed unresectable pancreatic tumor due to the vascular invasion.  Case was discussed in our GI tumor board.  We had difficult time to reach patient and her daughter, but finally was able to schedule her follow-up with [place] by mail and email.  I recommend repeating EUS for biopsy and fiducial placement, this will be offered by [facility] Dr. Rush Landmark after consulted with her primary GI Dr. Paulita Fujita. Pt is agreeable with this.  If biopsy confirm adenocarcinoma, I will offer chemotherapy and/or radiation.  Radiation oncologist Dr. Lisbeth Renshaw has previously reviewed her case, and thinks she is a candidate for SBRT.  We  briefly discussed the role of palliative chemotherapy for disease  control and prolonging her life, she is interested, will discuss more after her biopsy. All questions were answered, will refer patient to Dr. Rush Landmark for EUS.  I will see her back after procedure.  Truitt Merle  06/28/2019

## 2019-06-28 ENCOUNTER — Inpatient Hospital Stay: Payer: Medicare HMO | Attending: Nurse Practitioner | Admitting: Nurse Practitioner

## 2019-06-28 ENCOUNTER — Encounter: Payer: Self-pay | Admitting: Nurse Practitioner

## 2019-06-28 ENCOUNTER — Inpatient Hospital Stay: Payer: Medicare HMO

## 2019-06-28 ENCOUNTER — Other Ambulatory Visit: Payer: Self-pay

## 2019-06-28 VITALS — BP 176/72 | HR 77 | Temp 97.5°F | Resp 19 | Ht 61.0 in | Wt 111.7 lb

## 2019-06-28 DIAGNOSIS — M545 Low back pain: Secondary | ICD-10-CM | POA: Diagnosis not present

## 2019-06-28 DIAGNOSIS — I4891 Unspecified atrial fibrillation: Secondary | ICD-10-CM | POA: Insufficient documentation

## 2019-06-28 DIAGNOSIS — D649 Anemia, unspecified: Secondary | ICD-10-CM | POA: Insufficient documentation

## 2019-06-28 DIAGNOSIS — I10 Essential (primary) hypertension: Secondary | ICD-10-CM | POA: Diagnosis not present

## 2019-06-28 DIAGNOSIS — F329 Major depressive disorder, single episode, unspecified: Secondary | ICD-10-CM | POA: Insufficient documentation

## 2019-06-28 DIAGNOSIS — Z7901 Long term (current) use of anticoagulants: Secondary | ICD-10-CM | POA: Diagnosis not present

## 2019-06-28 DIAGNOSIS — C251 Malignant neoplasm of body of pancreas: Secondary | ICD-10-CM | POA: Diagnosis present

## 2019-06-28 DIAGNOSIS — J449 Chronic obstructive pulmonary disease, unspecified: Secondary | ICD-10-CM | POA: Diagnosis not present

## 2019-06-28 DIAGNOSIS — F419 Anxiety disorder, unspecified: Secondary | ICD-10-CM | POA: Diagnosis not present

## 2019-06-28 DIAGNOSIS — I2510 Atherosclerotic heart disease of native coronary artery without angina pectoris: Secondary | ICD-10-CM | POA: Diagnosis not present

## 2019-06-28 DIAGNOSIS — D490 Neoplasm of unspecified behavior of digestive system: Secondary | ICD-10-CM | POA: Diagnosis not present

## 2019-06-28 DIAGNOSIS — Z79899 Other long term (current) drug therapy: Secondary | ICD-10-CM | POA: Insufficient documentation

## 2019-06-28 DIAGNOSIS — K8689 Other specified diseases of pancreas: Secondary | ICD-10-CM

## 2019-06-28 LAB — CMP (CANCER CENTER ONLY)
ALT: 7 U/L (ref 0–44)
AST: 13 U/L — ABNORMAL LOW (ref 15–41)
Albumin: 3.8 g/dL (ref 3.5–5.0)
Alkaline Phosphatase: 142 U/L — ABNORMAL HIGH (ref 38–126)
Anion gap: 11 (ref 5–15)
BUN: 10 mg/dL (ref 8–23)
CO2: 25 mmol/L (ref 22–32)
Calcium: 9 mg/dL (ref 8.9–10.3)
Chloride: 103 mmol/L (ref 98–111)
Creatinine: 1.08 mg/dL — ABNORMAL HIGH (ref 0.44–1.00)
GFR, Est AFR Am: 58 mL/min — ABNORMAL LOW (ref >60)
GFR, Estimated: 50 mL/min — ABNORMAL LOW (ref >60)
Glucose, Bld: 97 mg/dL (ref 70–99)
Potassium: 4.4 mmol/L (ref 3.5–5.1)
Sodium: 139 mmol/L (ref 135–145)
Total Bilirubin: 0.7 mg/dL (ref 0.3–1.2)
Total Protein: 6.6 g/dL (ref 6.5–8.1)

## 2019-06-28 LAB — CBC WITH DIFFERENTIAL (CANCER CENTER ONLY)
Abs Immature Granulocytes: 0.02 10*3/uL (ref 0.00–0.07)
Basophils Absolute: 0 10*3/uL (ref 0.0–0.1)
Basophils Relative: 1 %
Eosinophils Absolute: 0.2 10*3/uL (ref 0.0–0.5)
Eosinophils Relative: 4 %
HCT: 34.5 % — ABNORMAL LOW (ref 36.0–46.0)
Hemoglobin: 11.6 g/dL — ABNORMAL LOW (ref 12.0–15.0)
Immature Granulocytes: 0 %
Lymphocytes Relative: 23 %
Lymphs Abs: 1.2 10*3/uL (ref 0.7–4.0)
MCH: 28.9 pg (ref 26.0–34.0)
MCHC: 33.6 g/dL (ref 30.0–36.0)
MCV: 85.8 fL (ref 80.0–100.0)
Monocytes Absolute: 0.6 10*3/uL (ref 0.1–1.0)
Monocytes Relative: 11 %
Neutro Abs: 3.2 10*3/uL (ref 1.7–7.7)
Neutrophils Relative %: 61 %
Platelet Count: 165 10*3/uL (ref 150–400)
RBC: 4.02 MIL/uL (ref 3.87–5.11)
RDW: 13.2 % (ref 11.5–15.5)
WBC Count: 5.3 10*3/uL (ref 4.0–10.5)
nRBC: 0 % (ref 0.0–0.2)

## 2019-06-29 ENCOUNTER — Telehealth: Payer: Self-pay

## 2019-06-29 ENCOUNTER — Other Ambulatory Visit: Payer: Self-pay

## 2019-06-29 DIAGNOSIS — D490 Neoplasm of unspecified behavior of digestive system: Secondary | ICD-10-CM

## 2019-06-29 LAB — CANCER ANTIGEN 19-9: CA 19-9: 297 U/mL — ABNORMAL HIGH (ref 0–35)

## 2019-06-29 NOTE — Telephone Encounter (Signed)
Dr Ardis Hughs Dr Paulita Fujita did her EUS in April this year.  Ok to proceed?

## 2019-06-29 NOTE — Telephone Encounter (Signed)
Yes. Dr. Paulita Fujita does not place fiducials and knows that these cases will be referred to [place] instead regardless of primary GI

## 2019-06-29 NOTE — Telephone Encounter (Signed)
-----   Message from Milus Banister, MD sent at 06/29/2019 12:39 PM EDT ----- Not sure if I added you to this.  See below. Thanks  Dj  ----- Message ----- From: Milus Banister, MD Sent: 06/29/2019   6:11 AM EDT To: Jonnie Finner, RN, Kyung Rudd, MD, #  Regan Rakers, We will get her this week or next.  Thanks  Jewelle Whitner, I can add this on to my Thursday EUS schedule this Thursday or next. IF GM can do sooner then please do that instead.  She'll need to hold the eliquis the entire day prior. For pancreatic mass with fiducial placement.  Thanks  Wynetta Fines  ----- Message ----- From: Alla Feeling, NP Sent: 06/28/2019   4:31 PM EDT To: Jonnie Finner, RN, Milus Banister, MD, #  We finally saw her back today. She appears to be a candidate for treatment and agrees with repeat EUS for tissue diagnosis and fiducial placement. We'll see her back a few days after to review diagnosis and finalize a treatment plan. If it shows adeno, Dr. Burr Medico may recommend low dose chemo before RT.   She is on eliquis, please advise for procedure.   Please call [phone] (patient's home #) or her daughter, [name], [phone] going forward. Malachy Mood, please update her contact info in [place].   Thanks, Regan Rakers

## 2019-06-29 NOTE — Telephone Encounter (Signed)
Spoke with the pt's daughter and advised her of the procedure and COVID testing for tomorrow morning.  Information also sent to My Chart and pt daughter to let me know if she has any questions.

## 2019-06-30 ENCOUNTER — Other Ambulatory Visit (HOSPITAL_COMMUNITY)
Admission: RE | Admit: 2019-06-30 | Discharge: 2019-06-30 | Disposition: A | Discharge status: Home / Self Care | Payer: Medicare HMO | Source: Ambulatory Visit | Attending: Gastroenterology | Admitting: Gastroenterology

## 2019-06-30 ENCOUNTER — Encounter (HOSPITAL_COMMUNITY): Payer: Self-pay | Admitting: Certified Registered Nurse Anesthetist

## 2019-06-30 DIAGNOSIS — Z01812 Encounter for preprocedural laboratory examination: Secondary | ICD-10-CM | POA: Diagnosis present

## 2019-06-30 DIAGNOSIS — Z20822 Contact with and (suspected) exposure to covid-19: Secondary | ICD-10-CM | POA: Insufficient documentation

## 2019-06-30 LAB — SARS CORONAVIRUS 2 (TAT 6-24 HRS): SARS Coronavirus 2: NEGATIVE

## 2019-07-01 ENCOUNTER — Telehealth: Payer: Self-pay | Admitting: Gastroenterology

## 2019-07-01 ENCOUNTER — Telehealth: Payer: Self-pay | Admitting: Nurse Practitioner

## 2019-07-01 ENCOUNTER — Ambulatory Visit (HOSPITAL_COMMUNITY)
Admission: RE | Admit: 2019-07-01 | Discharge: 2019-07-01 | Disposition: A | Discharge status: Home / Self Care | Payer: Medicare HMO | Attending: Gastroenterology | Admitting: Gastroenterology

## 2019-07-01 ENCOUNTER — Other Ambulatory Visit: Payer: Self-pay

## 2019-07-01 ENCOUNTER — Encounter (HOSPITAL_COMMUNITY)
Admission: RE | Disposition: A | Discharge status: Home / Self Care | Payer: Self-pay | Source: Home / Self Care | Attending: Gastroenterology

## 2019-07-01 DIAGNOSIS — D490 Neoplasm of unspecified behavior of digestive system: Secondary | ICD-10-CM

## 2019-07-01 DIAGNOSIS — Z5309 Procedure and treatment not carried out because of other contraindication: Secondary | ICD-10-CM | POA: Diagnosis not present

## 2019-07-01 SURGERY — CANCELLED PROCEDURE

## 2019-07-01 MED ORDER — SODIUM CHLORIDE 0.9 % IV SOLN: INTRAVENOUS | Status: DC

## 2019-07-01 NOTE — Telephone Encounter (Signed)
Thanks everyone. We'll keep her f/u on 07/12/19 with [place].  Regan Rakers

## 2019-07-01 NOTE — Telephone Encounter (Signed)
She forgot to stop taking her eliquis for today's EUS, fiducial placement.  Not safe to proceed.  Patty, can you contact her about reschduling this for next Thursday (after my last case).  Thanks   Roderic Ovens and Renette Butters

## 2019-07-01 NOTE — Progress Notes (Signed)
EUS cancelled today due to patient taking eliquis yesterday on June 16.  Patient to be rescheduled and arrangements made for daughter renee to be aware of all pre procedure instructions and also to come into endoscopy to help with pre procedure admission.

## 2019-07-01 NOTE — Telephone Encounter (Signed)
Scheduled per 6/17 sch message. Spoke with pt's daughter renee and aware of appt on 6/28.

## 2019-07-01 NOTE — Telephone Encounter (Signed)
Left message on machine to call back  Procedure has been rescheduled to 07/08/19 at 1215 pm at [facility] with Dr Ardis Hughs  COVID test on 07/05/19 at 1130 am

## 2019-07-01 NOTE — Telephone Encounter (Signed)
I spoke with [name] the pt's daughter and gave her the new instructions for procedure as well as COVID testing instructions.  I have also discussed at length the Eliquis hold.  The pt daughter has verbalized understanding of all the instructions.  Instructions also sent to My Chart.  The pt daughter confirms that she is able to log in and view information. She will call back with any further questions or concerns.

## 2019-07-02 NOTE — Progress Notes (Signed)
Patient left voicemail at [facility] 07/02/2019 asking if bottom dentures were removed while she was in [facility] endo 07/01/2019. Admit RN reports no items were removed from patient during her visit. Procedure was cancelled due to anticoagulant use and patient did not undress. Voice mail left for patient daughter Terriyah Westra informing her of this.  07/02/2019.8:28 AM

## 2019-07-05 ENCOUNTER — Other Ambulatory Visit (HOSPITAL_COMMUNITY)
Admission: RE | Admit: 2019-07-05 | Discharge: 2019-07-05 | Disposition: A | Discharge status: Home / Self Care | Payer: Medicare HMO | Source: Ambulatory Visit | Attending: Gastroenterology | Admitting: Gastroenterology

## 2019-07-05 DIAGNOSIS — Z01812 Encounter for preprocedural laboratory examination: Secondary | ICD-10-CM | POA: Diagnosis present

## 2019-07-05 DIAGNOSIS — Z20822 Contact with and (suspected) exposure to covid-19: Secondary | ICD-10-CM | POA: Insufficient documentation

## 2019-07-05 LAB — SARS CORONAVIRUS 2 (TAT 6-24 HRS): SARS Coronavirus 2: NEGATIVE

## 2019-07-07 NOTE — Progress Notes (Signed)
Confirmed appointment for tomorrow with daughter [name]. Daughter stated that they had stopped blood thinner as directed, to arrive at 6:15 in the am tomorrow.  No other questions.

## 2019-07-07 NOTE — Anesthesia Preprocedure Evaluation (Addendum)
Anesthesia Evaluation  Patient identified by MRN, date of birth, ID band Patient awake    Reviewed: Allergy & Precautions, NPO status , Patient's Chart, lab work & pertinent test results  History of Anesthesia Complications Negative for: history of anesthetic complications  Airway Mallampati: III  TM Distance: >3 FB Neck ROM: Full    Dental  (+) Upper Dentures, Lower Dentures   Pulmonary COPD,  COPD inhaler, Current SmokerPatient did not abstain from smoking.,    Pulmonary exam normal        Cardiovascular hypertension, Pt. on home beta blockers and Pt. on medications + CAD  Normal cardiovascular exam+ dysrhythmias Atrial Fibrillation    '20 TTE - EF 60-65%. No valvulopathy    Neuro/Psych PSYCHIATRIC DISORDERS Anxiety Depression negative neurological ROS     GI/Hepatic Neg liver ROS, GERD  Medicated and Controlled, Pancreatic neoplasm    Endo/Other  negative endocrine ROS  Renal/GU negative Renal ROS     Musculoskeletal  (+) Arthritis , Osteoarthritis,    Abdominal   Peds  Hematology negative hematology ROS (+)   Anesthesia Other Findings Covid test negative   Reproductive/Obstetrics                            Anesthesia Physical Anesthesia Plan  ASA: III  Anesthesia Plan: MAC   Post-op Pain Management:    Induction: Intravenous  PONV Risk Score and Plan: 2 and Propofol infusion and Treatment may vary due to age or medical condition  Airway Management Planned: Nasal Cannula and Natural Airway  Additional Equipment: None  Intra-op Plan:   Post-operative Plan:   Informed Consent: I have reviewed the patients History and Physical, chart, labs and discussed the procedure including the risks, benefits and alternatives for the proposed anesthesia with the patient or authorized representative who has indicated his/her understanding and acceptance.       Plan Discussed  with: CRNA and Anesthesiologist  Anesthesia Plan Comments:        Anesthesia Quick Evaluation

## 2019-07-08 ENCOUNTER — Encounter (HOSPITAL_COMMUNITY): Payer: Self-pay | Admitting: Gastroenterology

## 2019-07-08 ENCOUNTER — Encounter (HOSPITAL_COMMUNITY)
Admission: RE | Disposition: A | Discharge status: Home / Self Care | Payer: Self-pay | Source: Home / Self Care | Attending: Gastroenterology

## 2019-07-08 ENCOUNTER — Ambulatory Visit (HOSPITAL_COMMUNITY): Payer: Medicare HMO | Admitting: Anesthesiology

## 2019-07-08 ENCOUNTER — Ambulatory Visit (HOSPITAL_COMMUNITY)
Admission: RE | Admit: 2019-07-08 | Discharge: 2019-07-08 | Disposition: A | Discharge status: Home / Self Care | Payer: Medicare HMO | Attending: Gastroenterology | Admitting: Gastroenterology

## 2019-07-08 ENCOUNTER — Other Ambulatory Visit: Payer: Self-pay

## 2019-07-08 DIAGNOSIS — K8689 Other specified diseases of pancreas: Secondary | ICD-10-CM | POA: Diagnosis present

## 2019-07-08 DIAGNOSIS — F17210 Nicotine dependence, cigarettes, uncomplicated: Secondary | ICD-10-CM | POA: Diagnosis not present

## 2019-07-08 DIAGNOSIS — D490 Neoplasm of unspecified behavior of digestive system: Secondary | ICD-10-CM | POA: Diagnosis not present

## 2019-07-08 DIAGNOSIS — I10 Essential (primary) hypertension: Secondary | ICD-10-CM | POA: Diagnosis not present

## 2019-07-08 DIAGNOSIS — J449 Chronic obstructive pulmonary disease, unspecified: Secondary | ICD-10-CM | POA: Insufficient documentation

## 2019-07-08 DIAGNOSIS — K219 Gastro-esophageal reflux disease without esophagitis: Secondary | ICD-10-CM | POA: Insufficient documentation

## 2019-07-08 DIAGNOSIS — C251 Malignant neoplasm of body of pancreas: Secondary | ICD-10-CM | POA: Diagnosis not present

## 2019-07-08 DIAGNOSIS — I2510 Atherosclerotic heart disease of native coronary artery without angina pectoris: Secondary | ICD-10-CM | POA: Diagnosis not present

## 2019-07-08 DIAGNOSIS — I429 Cardiomyopathy, unspecified: Secondary | ICD-10-CM | POA: Diagnosis not present

## 2019-07-08 HISTORY — PX: EUS: SHX5427

## 2019-07-08 HISTORY — PX: FIDUCIAL MARKER PLACEMENT: SHX6858

## 2019-07-08 HISTORY — PX: ESOPHAGOGASTRODUODENOSCOPY: SHX5428

## 2019-07-08 HISTORY — PX: FINE NEEDLE ASPIRATION: SHX5430

## 2019-07-08 SURGERY — UPPER ENDOSCOPIC ULTRASOUND (EUS) RADIAL
Anesthesia: Monitor Anesthesia Care

## 2019-07-08 MED ORDER — PROPOFOL 1000 MG/100ML IV EMUL
INTRAVENOUS | Status: AC
  Filled 2019-07-08: qty 100

## 2019-07-08 MED ORDER — SODIUM CHLORIDE 0.9 % IV SOLN: INTRAVENOUS | Status: DC | PRN

## 2019-07-08 MED ORDER — LABETALOL HCL 5 MG/ML IV SOLN
INTRAVENOUS | Status: DC | PRN
  Administered 2019-07-08: 5 mg via INTRAVENOUS

## 2019-07-08 MED ORDER — PROPOFOL 500 MG/50ML IV EMUL
INTRAVENOUS | Status: DC | PRN
  Administered 2019-07-08: 200 ug/kg/min via INTRAVENOUS

## 2019-07-08 MED ORDER — SODIUM CHLORIDE 0.9 % IV SOLN: INTRAVENOUS | Status: DC

## 2019-07-08 MED ORDER — GLYCOPYRROLATE 0.2 MG/ML IJ SOLN
INTRAMUSCULAR | Status: DC | PRN
  Administered 2019-07-08: .1 mg via INTRAVENOUS

## 2019-07-08 MED ORDER — LIDOCAINE HCL (CARDIAC) PF 100 MG/5ML IV SOSY
PREFILLED_SYRINGE | INTRAVENOUS | Status: DC | PRN
  Administered 2019-07-08: 50 mg via INTRAVENOUS

## 2019-07-08 MED ORDER — LACTATED RINGERS IV SOLN
INTRAVENOUS | Status: AC | PRN
  Administered 2019-07-08: 1000 mL via INTRAVENOUS

## 2019-07-08 SURGICAL SUPPLY — 2 items
Beacon Fine Needle Fiducial System ×4 IMPLANT
Beacon Fine needle fiducial needle ×2 IMPLANT

## 2019-07-08 NOTE — Anesthesia Procedure Notes (Signed)
Procedure Name: MAC Date/Time: 07/08/2019 7:31 AM Performed by: Lissa Morales, CRNA Pre-anesthesia Checklist: Patient identified, Emergency Drugs available, Suction available, Patient being monitored and Timeout performed Patient Re-evaluated:Patient Re-evaluated prior to induction Oxygen Delivery Method: Simple face mask Placement Confirmation: positive ETCO2

## 2019-07-08 NOTE — Anesthesia Postprocedure Evaluation (Signed)
Anesthesia Post Note  Patient: GWENDOLYN MCLEES  Procedure(s) Performed: UPPER ENDOSCOPIC ULTRASOUND (EUS) RADIAL (N/A ) FIDUCIAL MARKER PLACEMENT (N/A ) FINE NEEDLE ASPIRATION (FNA) LINEAR (N/A )     Patient location during evaluation: PACU Anesthesia Type: MAC Level of consciousness: awake and alert Pain management: pain level controlled Vital Signs Assessment: post-procedure vital signs reviewed and stable Respiratory status: spontaneous breathing, nonlabored ventilation and respiratory function stable Cardiovascular status: stable Anesthetic complications: no   No complications documented.  Last Vitals:  Vitals:   07/08/19 0850 07/08/19 0900  BP: (!) 192/77 (!) 151/113  Pulse: 67 62  Resp: 19 17  Temp:    SpO2: 100% 94%    Last Pain:  Vitals:   07/08/19 0900  TempSrc:   PainSc: 0-No pain                 Audry Pili

## 2019-07-08 NOTE — H&P (Signed)
HPI: This is a woman with pancreatic cancer  ROS: complete GI ROS as described in HPI, all other review negative.  Constitutional:  No unintentional weight loss   Past Medical History:  Diagnosis Date  . Accidental drug overdose   . Acute encephalopathy   . Acute on chronic respiratory failure with hypoxia ([place]) 03/01/2017  . Acute renal failure ([place]) 04/16/2014  . AKI (acute kidney injury) ([place]) 04/30/2014  . Altered mental status 06/27/2012  . Anxiety   . Atrial fibrillation ([place])   . CAD (coronary artery disease)   . CAP (community acquired pneumonia) 04/18/2014  . Cardiomyopathy ([place])   . Chronic hypoxemic respiratory failure ([place]) 06/26/2012  . Chronic pain   . COPD exacerbation ([place]) 06/26/2012  . COPD with acute exacerbation ([place]) 03/01/2017  . DEPRESSIVE DISORDER, NOS 03/13/2006   Qualifier: Diagnosis of  By: Damita Dunnings MD, Phillip Heal    . Edema, peripheral   . Essential hypertension 04/30/2014  . Fall at home, initial encounter 03/01/2017  . GASTROESOPHAGEAL REFLUX, NO ESOPHAGITIS 03/13/2006   Qualifier: Diagnosis of  By: Damita Dunnings MD, Phillip Heal    . HYPERCHOLESTEROLEMIA 03/13/2006   Qualifier: Diagnosis of  By: Damita Dunnings MD, Phillip Heal    . Hyperlipidemia   . HYPERTENSION, BENIGN SYSTEMIC 03/13/2006   Qualifier: Diagnosis of  By: Damita Dunnings MD, Phillip Heal    . Hypokalemia 04/30/2014  . Hyponatremia 04/30/2014  . Hypotension 04/30/2014  . Hypoxemia 06/26/2012  . Hypoxia 03/26/2018  . Irritable bowel syndrome 03/13/2006   Qualifier: Diagnosis of  By: Damita Dunnings MD, Phillip Heal    . Lung nodule 04/16/2014  . MENOPAUSAL SYNDROME 03/13/2006   Qualifier: Diagnosis of  By: Damita Dunnings MD, Phillip Heal    . Mood disorder [facility]) 03/13/2006   Qualifier: Diagnosis of  By: Damita Dunnings MD, Phillip Heal    . 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 Nausea 04/30/2014  . Nausea vomiting and diarrhea 04/16/2014  . OSTEOARTHRITIS, MULTI SITES 03/13/2006   Qualifier: Diagnosis of  By: Damita Dunnings MD, Phillip Heal    . Pulmonary nodule 04/30/2014  . Respiratory failure  ([place]) 01/22/2017  . TOBACCO DEPENDENCE 03/13/2006   Qualifier: Diagnosis of  By: Damita Dunnings MD, Phillip Heal    . Weight loss     Past Surgical History:  Procedure Laterality Date  . ESOPHAGOGASTRODUODENOSCOPY (EGD) WITH PROPOFOL N/A 05/05/2019   Procedure: ESOPHAGOGASTRODUODENOSCOPY (EGD) WITH PROPOFOL;  Surgeon: Arta Silence, MD;  Location: [facility] END[facility];  Service: Endoscopy;  Laterality: N/A;  . EYE SURGERY    . FINE NEEDLE ASPIRATION N/A 05/05/2019   Procedure: FINE NEEDLE ASPIRATION (FNA) LINEAR;  Surgeon: Arta Silence, MD;  Location: [facility] ENDOSCOPY;  Service: Endoscopy;  Laterality: N/A;  . ROTATOR CUFF REPAIR Right 2012  . UPPER ESOPHAGEAL ENDOSCOPIC ULTRASOUND (EUS) N/A 05/05/2019   Procedure: UPPER ESOPHAGEAL ENDOSCOPIC ULTRASOUND (EUS) with FNA;  Surgeon: Arta Silence, MD;  Location: [facility];  Service: Endoscopy;  Laterality: N/A;    Current Facility-Administered Medications  Medication Dose Route Frequency Provider Last Rate Last Admin  . 0.9 %  sodium chloride infusion   Intravenous Continuous Milus Banister, MD        Allergies as of 07/01/2019  . (No Known Allergies)    Family History  Problem Relation Age of Onset  . CAD Father   . Lung cancer Brother   . Cancer Brother        lung ca  . Cancer Brother        possible pancreas cancer   . Healthy Daughter   . Healthy Daughter   .  Cancer Maternal Grandmother        breast  . Cancer Paternal Grandmother        breast    Social History   Socioeconomic History  . Marital status: Single    Spouse name: Not on file  . Number of children: 3  . Years of education: Not on file  . Highest education level: Not on file  Occupational History  . Not on file  Tobacco Use  . Smoking status: Current Every Day Smoker    Packs/day: 1.50    Years: 50.00    Pack years: 75.00    Types: Cigarettes  . Smokeless tobacco: Never Used  . Tobacco comment: states she is trying to quit  Vaping Use  . Vaping Use: Never used   Substance and Sexual Activity  . Alcohol use: Not Currently    Comment: drank alcohol daily x20 years, quit 10 years ago  . Drug use: No  . Sexual activity: Not on file  Other Topics Concern  . Not on file  Social History Narrative   Not working, lives with one daughter   Has 3 children, 2 daughters and 1 son; son died early 17-Apr-2019 secondary to liver failure from drug-use related Hep C   Social Determinants of Health   Financial Resource Strain:   . Difficulty of Paying Living Expenses:   Food Insecurity:   . Worried About Charity fundraiser in the Last Year:   . Arboriculturist in the Last Year:   Transportation Needs:   . Film/video editor (Medical):   Marland Kitchen Lack of Transportation (Non-Medical):   Physical Activity:   . Days of Exercise per Week:   . Minutes of Exercise per Session:   Stress:   . Feeling of Stress :   Social Connections:   . Frequency of Communication with Friends and Family:   . Frequency of Social Gatherings with Friends and Family:   . Attends Religious Services:   . Active Member of Clubs or Organizations:   . Attends Archivist Meetings:   Marland Kitchen Marital Status:   Intimate Partner Violence:   . Fear of Current or Ex-Partner:   . Emotionally Abused:   Marland Kitchen Physically Abused:   . Sexually Abused:      Physical Exam: BP (!) 188/90   Pulse (!) 59   Temp 98.1 F (36.7 C) (Oral)   Resp 16   Ht 5' (1.524 m)   Wt 49.9 kg   SpO2 99%   BMI 21.48 kg/m  Constitutional: generally well-appearing Psychiatric: alert and oriented x3 Abdomen: soft, nontender, nondistended, no obvious ascites, no peritoneal signs, normal bowel sounds No peripheral edema noted in lower extremities  Assessment and plan: 74 y.o. female with pancreatic cancer  Here for EUS guided fiducial placement to aid XRT  Please see the "Patient Instructions" section for addition details about the plan.  Owens Loffler, MD [facility] 07/08/2019, 7:09 AM

## 2019-07-08 NOTE — Discharge Instructions (Signed)
YOU HAD AN ENDOSCOPIC PROCEDURE TODAY: Refer to the procedure report and other information in the discharge instructions given to you for any specific questions about what was found during the examination. If this information does not answer your questions, please call [place] office at [phone] to clarify.   YOU SHOULD EXPECT: Some feelings of bloating in the abdomen. Passage of more gas than usual. Walking can help get rid of the air that was put into your GI tract during the procedure and reduce the bloating. If you had a lower endoscopy (such as a colonoscopy or flexible sigmoidoscopy) you may notice spotting of blood in your stool or on the toilet paper. Some abdominal soreness may be present for a day or two, also.  DIET: Your first meal following the procedure should be a light meal and then it is ok to progress to your normal diet. A half-sandwich or bowl of soup is an example of a good first meal. Heavy or fried foods are harder to digest and may make you feel nauseous or bloated. Drink plenty of fluids but you should avoid alcoholic beverages for 24 hours. If you had a esophageal dilation, please see attached instructions for diet.    ACTIVITY: Your care partner should take you home directly after the procedure. You should plan to take it easy, moving slowly for the rest of the day. You can resume normal activity the day after the procedure however YOU SHOULD NOT DRIVE, use power tools, machinery or perform tasks that involve climbing or major physical exertion for 24 hours (because of the sedation medicines used during the test).   SYMPTOMS TO REPORT IMMEDIATELY: A gastroenterologist can be reached at any hour. Please call [phone]  for any of the following symptoms:   Following upper endoscopy (EGD, EUS, ERCP, esophageal dilation) Vomiting of blood or coffee ground material  New, significant abdominal pain  New, significant chest pain or pain under the shoulder blades  Painful or  persistently difficult swallowing  New shortness of breath  Black, tarry-looking or red, bloody stools  FOLLOW UP:  If any biopsies were taken you will be contacted by phone or by letter within the next 1-3 weeks. Call [phone]  if you have not heard about the biopsies in 3 weeks.  Please also call with any specific questions about appointments or follow up tests.  

## 2019-07-08 NOTE — Transfer of Care (Signed)
Immediate Anesthesia Transfer of Care Note  Patient: Allison Vaughan  Procedure(s) Performed: UPPER ENDOSCOPIC ULTRASOUND (EUS) RADIAL (N/A ) FIDUCIAL MARKER PLACEMENT (N/A ) FINE NEEDLE ASPIRATION (FNA) LINEAR (N/A )  Patient Location: PACU  Anesthesia Type:MAC  Level of Consciousness:awake cooperative  Airway & Oxygen Therapy: Patient Spontanous Breathing and Patient connected to face mask oxygen  Post-op Assessment: Report given to RN, Post -op Vital signs reviewed and stable and Patient moving all extremities X 4  Post vital signs: stable  Last Vitals:  Vitals Value Taken Time  BP 197/91 07/08/19 0840  Temp    Pulse    Resp 19 07/08/19 0842  SpO2    Vitals shown include unvalidated device data.  Last Pain:  Vitals:   07/08/19 0653  TempSrc: Oral  PainSc: 6          Complications: No complications documented.

## 2019-07-08 NOTE — Op Note (Signed)
[facility] Patient Name: Allison Vaughan Procedure Date: 07/08/2019 MRN: [id number] Attending MD: Milus Banister , MD Date of Birth: December 13, 1945 CSN: [id number] Age: 74 Admit Type: Outpatient Procedure:                Upper EUS Indications:              Mass in body of pancreas on 12/2018 CT; EUS FNA                            with Dr. Wonda Horner 04/2019 showed mass, cytology                            was abnormal but non-diagnostic; CT 05/2019                            "Interval increase in size of poorly defined,                            hypoenhancing, infiltrating tumor arising from the                            body and proximal tail of pancreas. Progressive                            vascular involvement of the portal vein, portal                            venous confluence, splenic vein, and celiac                            artery." Referred for repeat FNA and also fiducial                            placement Providers:                Milus Banister, MD, Glori Bickers, RN, Cherylynn Ridges, Technician, Enrigue Catena, CRNA Referring MD:             Hassell Halim, MD Medicines:                Monitored Anesthesia Care Complications:            No immediate complications. Estimated blood loss:                            None. Estimated Blood Loss:     Estimated blood loss: none. Procedure:                Pre-Anesthesia Assessment:                           - Prior to the procedure, a History and Physical  was performed, and patient medications and                            allergies were reviewed. The patient's tolerance of                            previous anesthesia was also reviewed. The risks                            and benefits of the procedure and the sedation                            options and risks were discussed with the patient.                            All questions were answered, and  informed consent                            was obtained. Prior Anticoagulants: The patient has                            taken Eliquis (apixaban), last dose was 2 days                            prior to procedure. ASA Grade Assessment: III - A                            patient with severe systemic disease. After                            reviewing the risks and benefits, the patient was                            deemed in satisfactory condition to undergo the                            procedure.                           After obtaining informed consent, the endoscope was                            passed under direct vision. Throughout the                            procedure, the patient's blood pressure, pulse, and                            oxygen saturations were monitored continuously. The                            [id number] ([id number] Olympus Linear EUS was  introduced through the mouth, and advanced to the                            second part of duodenum. The upper EUS was                            accomplished without difficulty. The patient                            tolerated the procedure well. Scope In: Scope Out: Findings:      ENDOSCOPIC FINDING (limited views with linear echoendoscope): :      The examined esophagus was endoscopically normal.      The entire examined stomach was endoscopically normal.      The examined duodenum was endoscopically normal.      ENDOSONOGRAPHIC FINDING: :      1. Irregularly shaped, heterogeneous, hypoechoic mass was noted in the       body of the pancreas, measuring 2.7cm across. The mass clearly invades       the portal vein and obstructs the main pancreatic duct causing upstream       dilation (5-33mm in the body and tail). The mass was sampled with 4       transgastric passes with a 25 gauge EUS FNA needle. 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      2. CBD was normal, non-dilated.      3. Limited views of the liver, spleen were normal. Impression:               - 2.7cm mass in the body of pancreas with obvious                            large vessel invasion on this examination and on CT                            05/2019. Preliminary cytology review from today's                            FNA was positive for malignancy (adenocarcinoma).                            Following FNA 4 gold fiducials were placed into the                            mass using the [facility] system. Moderate Sedation:      Not Applicable - Patient had care per Anesthesia. Recommendation:           - Discharge patient to home (ambulatory).                           - OK ro resume your blood thinner today.                           -  Follow up with medical and radiation oncology. Procedure Code(s):        --- Professional ---                           [id number], Esophagogastroduodenoscopy, flexible,                            transoral; with transendoscopic ultrasound-guided                            transmural injection of diagnostic or therapeutic                            substance(s) (eg, anesthetic, neurolytic agent) or                            fiducial marker(s) (includes endoscopic ultrasound                            examination of the esophagus, stomach, and either                            the duodenum or a surgically altered stomach where                            the jejunum is examined distal to the anastomosis) Diagnosis Code(s):        --- Professional ---                           K86.89, Other specified diseases of pancreas                           R93.3, Abnormal findings on diagnostic imaging of                            other parts of digestive tract CPT copyright 2019 American Medical Association. All rights reserved. The  codes documented in this report are preliminary and upon coder review may  be revised to meet current compliance requirements. Milus Banister, MD 07/08/2019 8:45:45 AM This report has been signed electronically. Number of Addenda: 0

## 2019-07-09 ENCOUNTER — Telehealth: Payer: Self-pay | Admitting: Gastroenterology

## 2019-07-09 ENCOUNTER — Encounter (HOSPITAL_COMMUNITY): Payer: Self-pay | Admitting: Gastroenterology

## 2019-07-09 LAB — CYTOLOGY - NON PAP

## 2019-07-09 NOTE — Telephone Encounter (Signed)
Dr Ardis Hughs the pt had an EUS yesterday. Last night and today she complains of 10/10 pain in her upper abd all across her bra line.  She was in tears over the phone.  I advised her to go to the ED for evaluation

## 2019-07-10 ENCOUNTER — Encounter (HOSPITAL_COMMUNITY): Payer: Self-pay | Admitting: Emergency Medicine

## 2019-07-10 ENCOUNTER — Emergency Department (HOSPITAL_COMMUNITY)
Admission: EM | Admit: 2019-07-10 | Discharge: 2019-07-11 | Disposition: A | Discharge status: Home / Self Care | Payer: Medicare HMO | Attending: Emergency Medicine | Admitting: Emergency Medicine

## 2019-07-10 ENCOUNTER — Other Ambulatory Visit: Payer: Self-pay

## 2019-07-10 ENCOUNTER — Emergency Department (HOSPITAL_COMMUNITY): Payer: Medicare HMO

## 2019-07-10 DIAGNOSIS — J441 Chronic obstructive pulmonary disease with (acute) exacerbation: Secondary | ICD-10-CM | POA: Diagnosis not present

## 2019-07-10 DIAGNOSIS — I2510 Atherosclerotic heart disease of native coronary artery without angina pectoris: Secondary | ICD-10-CM | POA: Diagnosis not present

## 2019-07-10 DIAGNOSIS — Z79899 Other long term (current) drug therapy: Secondary | ICD-10-CM | POA: Diagnosis not present

## 2019-07-10 DIAGNOSIS — I10 Essential (primary) hypertension: Secondary | ICD-10-CM | POA: Insufficient documentation

## 2019-07-10 DIAGNOSIS — Z7982 Long term (current) use of aspirin: Secondary | ICD-10-CM | POA: Insufficient documentation

## 2019-07-10 DIAGNOSIS — C259 Malignant neoplasm of pancreas, unspecified: Secondary | ICD-10-CM | POA: Diagnosis not present

## 2019-07-10 DIAGNOSIS — F17210 Nicotine dependence, cigarettes, uncomplicated: Secondary | ICD-10-CM | POA: Diagnosis not present

## 2019-07-10 DIAGNOSIS — Z7901 Long term (current) use of anticoagulants: Secondary | ICD-10-CM | POA: Diagnosis not present

## 2019-07-10 DIAGNOSIS — R109 Unspecified abdominal pain: Secondary | ICD-10-CM | POA: Diagnosis present

## 2019-07-10 DIAGNOSIS — R1013 Epigastric pain: Secondary | ICD-10-CM

## 2019-07-10 LAB — LIPASE, BLOOD: Lipase: 19 U/L (ref 11–51)

## 2019-07-10 LAB — CBC WITH DIFFERENTIAL/PLATELET
Abs Immature Granulocytes: 0.06 10*3/uL (ref 0.00–0.07)
Basophils Absolute: 0 10*3/uL (ref 0.0–0.1)
Basophils Relative: 0 %
Eosinophils Absolute: 0.2 10*3/uL (ref 0.0–0.5)
Eosinophils Relative: 2 %
HCT: 35.3 % — ABNORMAL LOW (ref 36.0–46.0)
Hemoglobin: 11.8 g/dL — ABNORMAL LOW (ref 12.0–15.0)
Immature Granulocytes: 1 %
Lymphocytes Relative: 9 %
Lymphs Abs: 0.6 10*3/uL — ABNORMAL LOW (ref 0.7–4.0)
MCH: 29.4 pg (ref 26.0–34.0)
MCHC: 33.4 g/dL (ref 30.0–36.0)
MCV: 88 fL (ref 80.0–100.0)
Monocytes Absolute: 0.7 10*3/uL (ref 0.1–1.0)
Monocytes Relative: 10 %
Neutro Abs: 5.3 10*3/uL (ref 1.7–7.7)
Neutrophils Relative %: 78 %
Platelets: 113 10*3/uL — ABNORMAL LOW (ref 150–400)
RBC: 4.01 MIL/uL (ref 3.87–5.11)
RDW: 13.7 % (ref 11.5–15.5)
WBC: 6.9 10*3/uL (ref 4.0–10.5)
nRBC: 0 % (ref 0.0–0.2)

## 2019-07-10 LAB — COMPREHENSIVE METABOLIC PANEL
ALT: 17 U/L (ref 0–44)
AST: 32 U/L (ref 15–41)
Albumin: 4 g/dL (ref 3.5–5.0)
Alkaline Phosphatase: 122 U/L (ref 38–126)
Anion gap: 13 (ref 5–15)
BUN: 16 mg/dL (ref 8–23)
CO2: 26 mmol/L (ref 22–32)
Calcium: 8.8 mg/dL — ABNORMAL LOW (ref 8.9–10.3)
Chloride: 94 mmol/L — ABNORMAL LOW (ref 98–111)
Creatinine, Ser: 1.13 mg/dL — ABNORMAL HIGH (ref 0.44–1.00)
GFR calc Af Amer: 55 mL/min — ABNORMAL LOW (ref >60)
GFR calc non Af Amer: 47 mL/min — ABNORMAL LOW (ref >60)
Glucose, Bld: 115 mg/dL — ABNORMAL HIGH (ref 70–99)
Potassium: 3.8 mmol/L (ref 3.5–5.1)
Sodium: 133 mmol/L — ABNORMAL LOW (ref 135–145)
Total Bilirubin: 0.8 mg/dL (ref 0.3–1.2)
Total Protein: 7.2 g/dL (ref 6.5–8.1)

## 2019-07-10 LAB — URINALYSIS, ROUTINE W REFLEX MICROSCOPIC
Bilirubin Urine: NEGATIVE
Glucose, UA: NEGATIVE mg/dL
Hgb urine dipstick: NEGATIVE
Ketones, ur: NEGATIVE mg/dL
Nitrite: NEGATIVE
Protein, ur: 30 mg/dL — AB
Specific Gravity, Urine: 1.02 (ref 1.005–1.030)
pH: 6 (ref 5.0–8.0)

## 2019-07-10 LAB — LACTIC ACID, PLASMA: Lactic Acid, Venous: 2 mmol/L (ref 0.5–1.9)

## 2019-07-10 MED ORDER — IOHEXOL 300 MG/ML  SOLN
100.0000 mL | Freq: Once | INTRAMUSCULAR | Status: AC | PRN
  Administered 2019-07-10: 100 mL via INTRAVENOUS

## 2019-07-10 MED ORDER — FENTANYL CITRATE (PF) 100 MCG/2ML IJ SOLN
50.0000 ug | Freq: Once | INTRAMUSCULAR | Status: AC
  Administered 2019-07-10: 50 ug via INTRAVENOUS
  Filled 2019-07-10: qty 2

## 2019-07-10 MED ORDER — SODIUM CHLORIDE 0.9 % IV BOLUS
1000.0000 mL | Freq: Once | INTRAVENOUS | Status: AC
  Administered 2019-07-10: 1000 mL via INTRAVENOUS

## 2019-07-10 MED ORDER — SODIUM CHLORIDE (PF) 0.9 % IJ SOLN
INTRAMUSCULAR | Status: AC
  Filled 2019-07-10: qty 50

## 2019-07-10 NOTE — ED Triage Notes (Signed)
Patient reports abdominal pain with N/V since endoscopy on 6/24. Hx pancreatic cancer.

## 2019-07-10 NOTE — ED Notes (Signed)
Pt has one set of BC in main lab from [place] if needed

## 2019-07-10 NOTE — ED Provider Notes (Signed)
[facility] DEPT Provider Note   CSN: [id number] Arrival date & time: 07/10/19  2038     History Chief Complaint  Patient presents with  . Abdominal Pain    Allison Vaughan is a 74 y.o. female.  Presents to ER with concern for abdominal pain.  Patient recently diagnosed with pancreatic cancer, endoscopy on 6/24 for repeat FNA and also fiducial placement, biopsy consistent with pancreatic adenocarcinoma.  Prior CT demonstrated large vessel invasion.  Patient and daughter report they have been evaluated by surgery and if this was in fact pancreatic cancer she likely would be not a surgical candidate.  They have appointment on Monday with oncology.  Starting Thursday evening patient was having bad abdominal pain, became severe on Friday.  Had called GI and was recommended to come to ER however pain seemed to subside.  Today pain is worsening again, currently 9/10 in severity sharp, stabbing, epigastric.  Has had occasional episode of vomiting, nonbloody nonbilious.  No diarrhea or constipation.  No fever or chills.  HPI     Past Medical History:  Diagnosis Date  . Accidental drug overdose   . Acute encephalopathy   . Acute on chronic respiratory failure with hypoxia ([place]) 03/01/2017  . Acute renal failure ([place]) 04/16/2014  . AKI (acute kidney injury) ([place]) 04/30/2014  . Altered mental status 06/27/2012  . Anxiety   . Atrial fibrillation ([place])   . CAD (coronary artery disease)   . CAP (community acquired pneumonia) 04/18/2014  . Cardiomyopathy ([facility])   . Chronic hypoxemic respiratory failure ([place]) 06/26/2012  . Chronic pain   . COPD exacerbation ([place]) 06/26/2012  . COPD with acute exacerbation ([place]) 03/01/2017  . DEPRESSIVE DISORDER, NOS 03/13/2006   Qualifier: Diagnosis of  By: Damita Dunnings MD, Phillip Heal    . Edema, peripheral   . Essential hypertension 04/30/2014  . Fall at home, initial encounter 03/01/2017  . GASTROESOPHAGEAL REFLUX, NO ESOPHAGITIS 03/13/2006    Qualifier: Diagnosis of  By: Damita Dunnings MD, Phillip Heal    . HYPERCHOLESTEROLEMIA 03/13/2006   Qualifier: Diagnosis of  By: Damita Dunnings MD, Phillip Heal    . Hyperlipidemia   . HYPERTENSION, BENIGN SYSTEMIC 03/13/2006   Qualifier: Diagnosis of  By: Damita Dunnings MD, Phillip Heal    . Hypokalemia 04/30/2014  . Hyponatremia 04/30/2014  . Hypotension 04/30/2014  . Hypoxemia 06/26/2012  . Hypoxia 03/26/2018  . Irritable bowel syndrome 03/13/2006   Qualifier: Diagnosis of  By: Damita Dunnings MD, Phillip Heal    . Lung nodule 04/16/2014  . MENOPAUSAL SYNDROME 03/13/2006   Qualifier: Diagnosis of  By: Damita Dunnings MD, Phillip Heal    . Mood disorder [facility]) 03/13/2006   Qualifier: Diagnosis of  By: Damita Dunnings MD, Phillip Heal    . 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 Nausea 04/30/2014  . Nausea vomiting and diarrhea 04/16/2014  . OSTEOARTHRITIS, MULTI SITES 03/13/2006   Qualifier: Diagnosis of  By: Damita Dunnings MD, Phillip Heal    . Pulmonary nodule 04/30/2014  . Respiratory failure ([place]) 01/22/2017  . TOBACCO DEPENDENCE 03/13/2006   Qualifier: Diagnosis of  By: Damita Dunnings MD, Phillip Heal    . Weight loss     Patient Active Problem List   Diagnosis Date Noted  . Pancreatic neoplasm 06/24/2019  . Prolonged QT interval 12/22/2018  . Hypomagnesemia 12/22/2018  . Constipation 12/22/2018  . Dilation of pancreatic duct 12/22/2018  . Altered mental status 07/09/2018  . Acute lower UTI 07/09/2018  . Anemia 07/09/2018  . Bronchiolitis 07/09/2018  . Bronchitis 07/09/2018  . Acute metabolic encephalopathy 89/21/1943  .  Contusion of right wrist   . Fall   . Bacteria in urine   . Mild renal insufficiency 03/26/2018  . 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 Atrial fibrillation with RVR ([place]) 03/01/2017  . Acute on chronic respiratory failure with hypoxia ([place]) 03/01/2017  . Fall at home, initial encounter 03/01/2017  . Accidental drug overdose   . Respiratory failure ([place]) 01/22/2017  . Acute encephalopathy   . AKI (acute kidney injury) ([place]) 04/30/2014  . Pulmonary nodule 04/30/2014  .  Essential hypertension 04/30/2014  . Hypokalemia 04/30/2014  . Hyponatremia 04/30/2014  . Hypotension 04/30/2014  . Nausea & vomiting 04/30/2014  . Lung nodule 04/16/2014  . Chronic hypoxemic respiratory failure ([place]) 06/26/2012  . COPD (chronic obstructive pulmonary disease) ([place]) 06/26/2012  . HYPERCHOLESTEROLEMIA 03/13/2006  . TOBACCO DEPENDENCE 03/13/2006  . Mood disorder ([place]) 03/13/2006  . HYPERTENSION, BENIGN SYSTEMIC 03/13/2006  . GASTROESOPHAGEAL REFLUX, NO ESOPHAGITIS 03/13/2006  . IRRITABLE BOWEL SYNDROME 03/13/2006  . MENOPAUSAL SYNDROME 03/13/2006  . OSTEOARTHRITIS, MULTI SITES 03/13/2006    Past Surgical History:  Procedure Laterality Date  . ESOPHAGOGASTRODUODENOSCOPY N/A 07/08/2019   Procedure: ESOPHAGOGASTRODUODENOSCOPY (EGD);  Surgeon: Milus Banister, MD;  Location: [facility];  Service: Endoscopy;  Laterality: N/A;  . ESOPHAGOGASTRODUODENOSCOPY (EGD) WITH PROPOFOL N/A 05/05/2019   Procedure: ESOPHAGOGASTRODUODENOSCOPY (EGD) WITH PROPOFOL;  Surgeon: Arta Silence, MD;  Location: [facility];  Service: Endoscopy;  Laterality: N/A;  . EUS N/A 07/08/2019   Procedure: UPPER ENDOSCOPIC ULTRASOUND (EUS) RADIAL;  Surgeon: Milus Banister, MD;  Location: [facility];  Service: Endoscopy;  Laterality: N/A;  . EYE SURGERY    . FIDUCIAL MARKER PLACEMENT N/A 07/08/2019   Procedure: FIDUCIAL MARKER PLACEMENT;  Surgeon: Milus Banister, MD;  Location: [facility];  Service: Endoscopy;  Laterality: N/A;  . FINE NEEDLE ASPIRATION N/A 05/05/2019   Procedure: FINE NEEDLE ASPIRATION (FNA) LINEAR;  Surgeon: Arta Silence, MD;  Location: [facility];  Service: Endoscopy;  Laterality: N/A;  . FINE NEEDLE ASPIRATION N/A 07/08/2019   Procedure: FINE NEEDLE ASPIRATION (FNA) LINEAR;  Surgeon: Milus Banister, MD;  Location: [facility];  Service: Endoscopy;  Laterality: N/A;  . ROTATOR CUFF REPAIR Right 2012  . UPPER ESOPHAGEAL ENDOSCOPIC ULTRASOUND (EUS) N/A 05/05/2019    Procedure: UPPER ESOPHAGEAL ENDOSCOPIC ULTRASOUND (EUS) with FNA;  Surgeon: Arta Silence, MD;  Location: [facility];  Service: Endoscopy;  Laterality: N/A;     OB History   No obstetric history on file.     Family History  Problem Relation Age of Onset  . CAD Father   . Lung cancer Brother   . Cancer Brother        lung ca  . Cancer Brother        possible pancreas cancer   . Healthy Daughter   . Healthy Daughter   . Cancer Maternal Grandmother        breast  . Cancer Paternal Grandmother        breast    Social History   Tobacco Use  . Smoking status: Current Every Day Smoker    Packs/day: 1.50    Years: 50.00    Pack years: 75.00    Types: Cigarettes  . Smokeless tobacco: Never Used  . Tobacco comment: states she is trying to quit  Vaping Use  . Vaping Use: Never used  Substance Use Topics  . Alcohol use: Not Currently    Comment: drank alcohol daily x20 years, quit 10 years ago  . Drug use: No  Home Medications Prior to Admission medications   Medication Sig Start Date End Date Taking? Authorizing Provider  albuterol (PROVENTIL HFA;VENTOLIN HFA) 108 (90 BASE) MCG/ACT inhaler Inhale 2 puffs into the lungs every 6 (six) hours as needed for wheezing. 06/09/12   Monika Salk, MD  albuterol (PROVENTIL) (2.5 MG/3ML) 0.083% nebulizer solution Take 3 mLs (2.5 mg total) by nebulization every 2 (two) hours as needed for wheezing. 04/20/14   Rama, Venetia Maxon, MD  apixaban (ELIQUIS) 5 MG TABS tablet Take 1 tablet (5 mg total) by mouth 2 (two) times daily. Patient taking differently: Take 5 mg by mouth daily.  02/01/19   Kroeger, Lorelee Cover., PA-C  Aspirin-Salicylamide-Caffeine (BC HEADACHE PO) Take 1 packet by mouth 2 (two) times daily as needed (headaches).     [provider]  atorvastatin (LIPITOR) 10 MG tablet Take 1 tablet (10 mg total) by mouth at bedtime. Patient taking differently: Take 10 mg by mouth every evening.  03/28/18   Cherene Altes, MD   FLUoxetine (PROZAC) 40 MG capsule Take 40 mg by mouth every evening.     [provider]  LORazepam (ATIVAN) 0.5 MG tablet Take 0.5 mg by mouth at bedtime.     [provider]  losartan (COZAAR) 25 MG tablet Take 1 tablet (25 mg total) by mouth daily. Patient taking differently: Take 25 mg by mouth every evening.  02/01/19 07/08/19  Kroeger, Lorelee Cover., PA-C  metoprolol succinate (TOPROL-XL) 25 MG 24 hr tablet Take 2 tablets (50 mg total) by mouth daily. Take with or immediately following a meal. Patient taking differently: Take 50 mg by mouth every evening. Take with or immediately following a meal. 02/01/19 07/08/19  Kroeger, Lorelee Cover., PA-C  naproxen sodium (ALEVE) 220 MG tablet Take 220 mg by mouth daily as needed (headache).    [provider]  nitroGLYCERIN (NITROSTAT) 0.4 MG SL tablet Place 1 tablet (0.4 mg total) under the tongue every 5 (five) minutes as needed for chest pain. 02/01/19 05/02/19  Kroeger, Lorelee Cover., PA-C  Nutritional Supplements (ENSURE ENLIVE PO) Take 237 mLs by mouth daily as needed (when want too).     [provider]  omeprazole (PRILOSEC) 40 MG capsule Take 40 mg by mouth every evening.     [provider]  Oxycodone HCl 10 MG TABS Take 10 mg by mouth 4 (four) times daily as needed (pain).  03/23/18   [provider]  potassium chloride (KLOR-CON) 10 MEQ tablet Take 10 mEq by mouth every evening.  04/15/19   [provider]  umeclidinium bromide (INCRUSE ELLIPTA) 62.5 MCG/INH AEPB Inhale 1 puff into the lungs daily. 12/26/18   Florencia Reasons, MD    Allergies    Patient has no known allergies.  Review of Systems   Review of Systems  Constitutional: Negative for chills and fever.  HENT: Negative for ear pain and sore throat.   Eyes: Negative for pain and visual disturbance.  Respiratory: Negative for cough and shortness of breath.   Cardiovascular: Negative for chest pain and palpitations.  Gastrointestinal: Positive  for abdominal pain, nausea and vomiting.  Genitourinary: Negative for dysuria and hematuria.  Musculoskeletal: Negative for arthralgias and back pain.  Skin: Negative for color change and rash.  Neurological: Negative for seizures and syncope.  All other systems reviewed and are negative.   Physical Exam Updated Vital Signs BP (!) 157/74 (BP Location: Left Arm)   Pulse 86   Temp 99.5 F (37.5 C) (Oral)  Resp 20   Wt 49.9 kg   SpO2 98%   BMI 21.48 kg/m   Physical Exam Vitals and nursing note reviewed.  Constitutional:      General: She is not in acute distress.    Appearance: She is well-developed.  HENT:     Head: Normocephalic and atraumatic.  Eyes:     Conjunctiva/sclera: Conjunctivae normal.  Cardiovascular:     Rate and Rhythm: Normal rate and regular rhythm.     Heart sounds: No murmur heard.   Pulmonary:     Effort: Pulmonary effort is normal. No respiratory distress.     Breath sounds: Normal breath sounds.  Abdominal:     Comments: No distention, there is generalized tenderness to palpation, worse in the epigastric region  Musculoskeletal:     Cervical back: Neck supple.  Skin:    General: Skin is warm and dry.  Neurological:     Mental Status: She is alert.     ED Results / Procedures / Treatments   Labs (all labs ordered are listed, but only abnormal results are displayed) Labs Reviewed  LACTIC ACID, PLASMA - Abnormal; Notable for the following components:      Result Value   Lactic Acid, Venous 2.0 (*)    All other components within normal limits  COMPREHENSIVE METABOLIC PANEL - Abnormal; Notable for the following components:   Sodium 133 (*)    Chloride 94 (*)    Glucose, Bld 115 (*)    Creatinine, Ser 1.13 (*)    Calcium 8.8 (*)    GFR calc non Af Amer 47 (*)    GFR calc Af Amer 55 (*)    All other components within normal limits  CBC WITH DIFFERENTIAL/PLATELET - Abnormal; Notable for the following components:   Hemoglobin 11.8 (*)     HCT 35.3 (*)    Platelets 113 (*)    Lymphs Abs 0.6 (*)    All other components within normal limits  URINALYSIS, ROUTINE W REFLEX MICROSCOPIC - Abnormal; Notable for the following components:   Protein, ur 30 (*)    Leukocytes,Ua TRACE (*)    Bacteria, UA RARE (*)    All other components within normal limits  LACTIC ACID, PLASMA  LIPASE, BLOOD    EKG None  Radiology DG Chest Portable 1 View  Result Date: 07/10/2019 CLINICAL DATA:  Epigastric pain EXAM: PORTABLE CHEST 1 VIEW COMPARISON:  12/21/2018 FINDINGS: There are atherosclerotic changes of the thoracic aorta. The heart size is stable. There is no pneumothorax. There is some atelectasis at the left lung base. No significant pleural effusion. IMPRESSION: No active disease. Electronically Signed   By: Constance Holster M.D.   On: 07/10/2019 22:14    Procedures Procedures (including critical care time)  Medications Ordered in ED Medications  sodium chloride (PF) 0.9 % injection (0 mLs  Hold 07/10/19 2236)  fentaNYL (SUBLIMAZE) injection 50 mcg (50 mcg Intravenous Given 07/10/19 2212)  sodium chloride 0.9 % bolus 1,000 mL (1,000 mLs Intravenous New Bag/Given 07/10/19 2212)  iohexol (OMNIPAQUE) 300 MG/ML solution 100 mL (100 mLs Intravenous Contrast Given 07/10/19 2227)    ED Course  I have reviewed the triage vital signs and the nursing notes.  Pertinent labs & imaging results that were available during my care of the patient were reviewed by me and considered in my medical decision making (see chart for details).    MDM Rules/Calculators/A&P  74 year old lady recent endoscopy and transgastric biopsy of pancreatic mass identifying pancreatic adenocarcinoma presents to ER with abdominal pain since procedure.  On exam noted to have significant tenderness, also low-grade temperature.  CT ordered to further evaluate. Provided fluids, pain meds.    While awaiting CT results, patient signed out to Dr. Leonides Schanz.   Please refer to her note for final plan and disposition.  Final Clinical Impression(s) / ED Diagnoses Final diagnoses:  Pancreatic adenocarcinoma ([place])  Epigastric pain    Rx / DC Orders ED Discharge Orders    None       Lucrezia Starch, MD 07/10/19 2250

## 2019-07-10 NOTE — ED Provider Notes (Signed)
11:00 PM  Assumed care.  Patient is a 74 year old female with recent diagnosis of pancreatic adenocarcinoma who underwent endoscopy and biopsy 2 days ago.  Presents to the emergency department with increasing abdominal pain and vomiting.  Found to have low-grade fever here.  Work-up has been unremarkable except for a lactate of 2.0.  Repeat is pending.  CT of the abdomen pelvis shows no acute abnormality other than normal postprocedural changes.  Chest x-ray clear.  Urine shows no infection.  Pain improved with fentanyl in ED.  Plan is to discuss with GI on-call given fever.  12:00 AM  Spoke with Dr. Benson Norway on-call for Dr. Ardis Hughs with [facility].  He agrees it seems reasonable to send patient home especially given she appears much more comfortable after 1 dose of 50 mcg of fentanyl.  Does not feel at this time she has to be admitted to receive antibiotics.  Repeat lactate has improved.  Does appear patient received approximately 100 tablets of oxycodone on 06/24/2019 but states she is out of this medication.  Will provide with refill as well as Zofran.  She states she has follow-up with oncology on Monday.  Have also encouraged her to follow-up with GI as recommended by Dr. Benson Norway.  Discussed at length return precautions with patient and daughter at bedside.  They are comfortable with this plan and agree with close outpatient follow-up.  Prescription of oxycodone and Zofran have been sent to their pharmacy.  I reviewed all nursing notes and pertinent previous records as available.  I have reviewed and interpreted any EKGs, lab and urine results, imaging (as available).    Marlis Oldaker, Delice Bison, DO 07/11/19 [phone]

## 2019-07-11 LAB — LACTIC ACID, PLASMA: Lactic Acid, Venous: 1 mmol/L (ref 0.5–1.9)

## 2019-07-11 MED ORDER — OXYCODONE HCL 10 MG PO TABS: 10.0000 mg | ORAL_TABLET | Freq: Four times a day (QID) | ORAL | 0 refills | Status: DC | PRN

## 2019-07-11 MED ORDER — ONDANSETRON 4 MG PO TBDP
4.0000 mg | ORAL_TABLET | Freq: Four times a day (QID) | ORAL | 0 refills | Status: DC | PRN
Start: 2019-07-11 — End: 2019-08-15

## 2019-07-11 NOTE — Discharge Instructions (Addendum)
Your labs, urine, CT scan today were reassuring.  You did have a low-grade fever here.  We discussed your case with the on-call gastroenterologist Dr. Janan Halter who recommends close outpatient follow-up with your gastroenterologist Dr. Ardis Hughs on Monday.  Please contact the office Monday morning.  If you develop worsening pain, vomiting that is uncontrolled with medication, continued fevers (100.4 or higher), please return to the emergency department.  It appears you received 100 tablets of oxycodone 10 mg on June 10.  Please continue to take this medication as needed for pain control.  We have sent a prescription of nausea medicine to your pharmacy.  You may take Tylenol 1000 mg every 6 hours as needed for fever.

## 2019-07-11 NOTE — Progress Notes (Addendum)
[name]   Telephone:([phone]) [phone] [phone]) [phone]   Clinic Follow up Note   Patient Care Team: Sandi Mariscal, MD as PCP - General (Internal Medicine) Debara Pickett Nadean Corwin, MD as PCP - Cardiology (Cardiology) Jonnie Finner, RN as Oncology Nurse Navigator Alla Feeling, NP as Nurse Practitioner (Nurse Practitioner) Truitt Merle, MD as Consulting Physician (Hematology) Mansouraty, Telford Nab., MD as Consulting Physician (Gastroenterology) 07/12/2019  CHIEF COMPLAINT: F/u pancreas cancer   SUMMARY OF ONCOLOGIC HISTORY: Oncology History Overview Note  Cancer Staging Primary adenocarcinoma of body of pancreas [facility]) Staging form: Exocrine Pancreas, AJCC 8th Edition - Clinical stage from 07/08/2019: Stage III (cT4, cN1, cM0) - Signed by Truitt Merle, MD on 07/13/2019    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[name])  12/21/2018 Imaging   CT AP w contrast IMPRESSION: 1. Large amount of stool at the level of the rectum. Correlate for symptoms of fecal impaction. 2. Persistent dilatation of the distal pancreatic duct with abrupt cutoff at the level of the pancreatic body. Further evaluation with a nonemergent outpatient contrast enhanced MRI is recommended. 3. Nonobstructive left nephrolithiasis. 4. Subacute anterior right rib fractures.   Aortic Atherosclerosis (ICD10-I70.0).   12/24/2018 Imaging   MRCP IMPRESSION: 1. Suspect a 3.5 cm infiltrating pancreatic body neoplasm causing obstruction of the main pancreatic duct and atrophy of the pancreatic tail. The pancreatic head appears normal. 2. Limited examination due to lack of IV contrast and breathing motion artifact. Difficult to assess the vascular structures for invasion. 3. Recommend endoscopic biopsy. A dedicated high-resolution pancreatic CT scan with contrast may be helpful for further evaluation. 4. No obvious metastatic disease.   12/26/2018 Tumor Marker   CA 19-9: 250    05/05/2019 Procedure   EUS by  Dr. Paulita Fujita An irregular mass was identified in the pancreatic body. The mass was hypoechoic. The mass measured 24 mm by 17 mm in maximal cross-sectional diameter. The endosonographic borders were poorlydefined. The remainder of the pancreas was examined. The endosonographic appearance of parenchyma and the upstream pancreatic duct indicated duct dilation. Appeared to be tissue ingrowth into portions of the dilated pancreatic duct. Fine needle aspiration for cytology was performed.  A few abnormal lymph nodes were visualized in the peripancreatic region. The nodes were oval, hypoechoic and had well defined margins. IMPRESSION - A mass was identified in the pancreatic body. This was staged T3 N1 Mx byendosonographic criteria. Fine needle aspiration performed. 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 - A few abnormal lymph nodes were visualized in the peripancreatic region. - Pancreatic parenchymal abnormalities consisting of edema and stranding were noted in the genu of the pancreas and pancreatic body. - There was no evidence of significant pathology.   05/29/2019 Imaging   CT CAP w contrast IMPRESSION: 1. Interval increase in size of poorly defined, hypoenhancing, infiltrating tumor arising from the body and proximal tail of pancreas. 2. Progressive vascular involvement of the portal vein, portal venous confluence, splenic vein, and celiac artery. 3. No specific features of solid organ metastases or metastatic disease to the chest. 4. Similar appearance of tiny, upper lung zone predominant solid and ground-glass attenuating nodules which may be postinflammatory/infectious in etiology. 5. Aortic atherosclerosis, in addition to 3 vessel coronary artery disease. Please note that although the presence of coronary artery calcium documents the presence of coronary artery disease, the severity of this disease and any potential stenosis cannot be assessed on this  non-gated CT examination. Assessment for potential risk factor modification,  dietary therapy or pharmacologic therapy may be warranted, if clinically indicated.   Aortic Atherosclerosis (ICD10-I70.0).   07/08/2019 Procedure   Repeat EUS for biopsy and fiducial placement by Dr. Ardis Hughs   1. Irregularly shaped, heterogeneous, hypoechoic mass was noted in the body of the pancreas, measuring 2.7cm across. The mass clearly invades the portal vein and obstructs the main pancreatic duct causing upstream dilation (5-65m in the body and tail). Preliminary cytology review was positive for malignancy (adenocarcinoma) and then 4 gold fiducials were placed into the mass using the Ce[employer] system. 2. CBD was normal, non-dilated. 3. Limited views of the liver, spleen were normal.      07/08/2019 Pathology Results   FINAL MICROSCOPIC DIAGNOSIS:  - Malignant cells consistent with adenocarcinoma    07/08/2019 Cancer Staging   Staging form: Exocrine Pancreas, AJCC 8th Edition - Clinical stage from 07/08/2019: Stage III (cT4, cN1, cM0) - Signed by FTruitt Merle MD on 07/13/2019   07/12/2019 Initial Diagnosis   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[place]     CURRENT THERAPY: PENDING  INTERVAL HISTORY: Ms. DNeiderreturns for f/u as scheduled. Allison Vaughan underwent repeat EUS for tissue biopsy and fiducial marker placement on 07/08/19 which showed a 2.7 cm mass in the pancreatic body with large vessel invasion. Path showed malignant cells consistent with adenocarcinoma. Allison Vaughan presented to ED on 07/10/19 for increased abdominal pain, vomiting and fever since procedure. CT AP showed post-procedure changes, otherwise stable pancreatic mass and no metastatic disease in the abdomen or pelvis. Allison Vaughan improved with fentanyl and fluids. On call GI Dr. HBenson Norwaydid not recommend admission. No antibiotics were given. Allison Vaughan was discharged home. Did not pick up the oxycodone rx from ED because Allison Vaughan is scared to break  her pain contract from Dr. SNancy Fetter   Today, Allison Vaughan presents with her daughter. Allison Vaughan has 8/10 up to 10/10 middle abdominal pain. Allison Vaughan also has chronic back pain from arthritis. Allison Vaughan ran out of her usual dose oxycodone soon bc Allison Vaughan was taking more after EUS. Pain is constantly dull with periodic sharp pain exacerbations. No fever, chills. Allison Vaughan is fatigued. Allison Vaughan lives with daughter and a roommate. Allison Vaughan is out of bed more than half the time. Allison Vaughan can do light housework. Daughter helps prepare most meals. Allison Vaughan is scared about her diagnosis.    MEDICAL HISTORY:  Past Medical History:  Diagnosis Date  . Accidental drug overdose   . Acute encephalopathy   . Acute on chronic respiratory failure with hypoxia ([place] 03/01/2017  . Acute renal failure ([place] 04/16/2014  . AKI (acute kidney injury) ([place] 04/30/2014  . Altered mental status 06/27/2012  . Anxiety   . Atrial fibrillation ([place]   . CAD (coronary artery disease)   . CAP (community acquired pneumonia) 04/18/2014  . Cardiomyopathy ([place]   . Chronic hypoxemic respiratory failure ([place] 06/26/2012  . Chronic pain   . COPD exacerbation ([place] 06/26/2012  . COPD with acute exacerbation ([place] 03/01/2017  . DEPRESSIVE DISORDER, NOS 03/13/2006   Qualifier: Diagnosis of  By: DDamita DunningsMD, GPhillip Heal   . Edema, peripheral   . Essential hypertension 04/30/2014  . Fall at home, initial encounter 03/01/2017  . GASTROESOPHAGEAL REFLUX, NO ESOPHAGITIS 03/13/2006   Qualifier: Diagnosis of  By: DDamita DunningsMD, GPhillip Heal   . HYPERCHOLESTEROLEMIA 03/13/2006   Qualifier: Diagnosis of  By: DDamita DunningsMD, GPhillip Heal   . Hyperlipidemia   . HYPERTENSION, BENIGN SYSTEMIC 03/13/2006   Qualifier: Diagnosis of  By: DDamita DunningsMD, GPhillip Heal   .  Hypokalemia 04/30/2014  . Hyponatremia 04/30/2014  . Hypotension 04/30/2014  . Hypoxemia 06/26/2012  . Hypoxia 03/26/2018  . Irritable bowel syndrome 03/13/2006   Qualifier: Diagnosis of  By: Damita Dunnings MD, Phillip Heal    . Lung nodule 04/16/2014  . MENOPAUSAL SYNDROME 03/13/2006    Qualifier: Diagnosis of  By: Damita Dunnings MD, Phillip Heal    . Mood disorder [facility]) 03/13/2006   Qualifier: Diagnosis of  By: Damita Dunnings MD, Phillip Heal    . 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 Nausea 04/30/2014  . Nausea vomiting and diarrhea 04/16/2014  . OSTEOARTHRITIS, MULTI SITES 03/13/2006   Qualifier: Diagnosis of  By: Damita Dunnings MD, Phillip Heal    . Pulmonary nodule 04/30/2014  . Respiratory failure ([place]) 01/22/2017  . TOBACCO DEPENDENCE 03/13/2006   Qualifier: Diagnosis of  By: Damita Dunnings MD, Phillip Heal    . Weight loss     SURGICAL HISTORY: Past Surgical History:  Procedure Laterality Date  . ESOPHAGOGASTRODUODENOSCOPY N/A 07/08/2019   Procedure: ESOPHAGOGASTRODUODENOSCOPY (EGD);  Surgeon: Milus Banister, MD;  Location: [facility];  Service: Endoscopy;  Laterality: N/A;  . ESOPHAGOGASTRODUODENOSCOPY (EGD) WITH PROPOFOL N/A 05/05/2019   Procedure: ESOPHAGOGASTRODUODENOSCOPY (EGD) WITH PROPOFOL;  Surgeon: Arta Silence, MD;  Location: [facility];  Service: Endoscopy;  Laterality: N/A;  . EUS N/A 07/08/2019   Procedure: UPPER ENDOSCOPIC ULTRASOUND (EUS) RADIAL;  Surgeon: Milus Banister, MD;  Location: [facility];  Service: Endoscopy;  Laterality: N/A;  . EYE SURGERY    . FIDUCIAL MARKER PLACEMENT N/A 07/08/2019   Procedure: FIDUCIAL MARKER PLACEMENT;  Surgeon: Milus Banister, MD;  Location: [facility];  Service: Endoscopy;  Laterality: N/A;  . FINE NEEDLE ASPIRATION N/A 05/05/2019   Procedure: FINE NEEDLE ASPIRATION (FNA) LINEAR;  Surgeon: Arta Silence, MD;  Location: [facility];  Service: Endoscopy;  Laterality: N/A;  . FINE NEEDLE ASPIRATION N/A 07/08/2019   Procedure: FINE NEEDLE ASPIRATION (FNA) LINEAR;  Surgeon: Milus Banister, MD;  Location: [facility];  Service: Endoscopy;  Laterality: N/A;  . ROTATOR CUFF REPAIR Right 2012  . UPPER ESOPHAGEAL ENDOSCOPIC ULTRASOUND (EUS) N/A 05/05/2019   Procedure: UPPER ESOPHAGEAL ENDOSCOPIC ULTRASOUND (EUS) with FNA;  Surgeon: Arta Silence, MD;  Location:  [facility];  Service: Endoscopy;  Laterality: N/A;    I have reviewed the social history and family history with the patient and they are unchanged from previous note.  ALLERGIES:  has No Known Allergies.  MEDICATIONS:  Current Outpatient Medications  Medication Sig Dispense Refill  . albuterol (PROVENTIL HFA;VENTOLIN HFA) 108 (90 BASE) MCG/ACT inhaler Inhale 2 puffs into the lungs every 6 (six) hours as needed for wheezing. 1 Inhaler 2  . albuterol (PROVENTIL) (2.5 MG/3ML) 0.083% nebulizer solution Take 3 mLs (2.5 mg total) by nebulization every 2 (two) hours as needed for wheezing. 75 mL 12  . apixaban (ELIQUIS) 5 MG TABS tablet Take 1 tablet (5 mg total) by mouth 2 (two) times daily. (Patient taking differently: Take 5 mg by mouth daily. ) 180 tablet 3  . Aspirin-Salicylamide-Caffeine (BC HEADACHE PO) Take 1 packet by mouth 2 (two) times daily as needed (headaches).     Marland Kitchen atorvastatin (LIPITOR) 10 MG tablet Take 1 tablet (10 mg total) by mouth at bedtime. (Patient taking differently: Take 10 mg by mouth every evening. ) 30 tablet 0  . FLUoxetine (PROZAC) 40 MG capsule Take 40 mg by mouth every evening.     Marland Kitchen LORazepam (ATIVAN) 0.5 MG tablet Take 0.5 mg by mouth at bedtime.     Marland Kitchen losartan (COZAAR)  25 MG tablet Take 1 tablet (25 mg total) by mouth daily. (Patient taking differently: Take 25 mg by mouth every evening. ) 90 tablet 3  . metoprolol succinate (TOPROL-XL) 25 MG 24 hr tablet Take 2 tablets (50 mg total) by mouth daily. Take with or immediately following a meal. (Patient taking differently: Take 50 mg by mouth every evening. Take with or immediately following a meal.) 90 tablet 3  . naproxen sodium (ALEVE) 220 MG tablet Take 220 mg by mouth daily as needed (headache).    . Nutritional Supplements (ENSURE ENLIVE PO) Take 237 mLs by mouth daily as needed (when want too).     Marland Kitchen omeprazole (PRILOSEC) 40 MG capsule Take 40 mg by mouth every evening.     . ondansetron (ZOFRAN ODT) 4 MG  disintegrating tablet Take 1 tablet (4 mg total) by mouth every 6 (six) hours as needed for nausea or vomiting. 30 tablet 0  . oxyCODONE 10 MG TABS Take 1 tablet (10 mg total) by mouth every 6 (six) hours as needed for severe pain. 20 tablet 0  . potassium chloride (KLOR-CON) 10 MEQ tablet Take 10 mEq by mouth every evening.     . umeclidinium bromide (INCRUSE ELLIPTA) 62.5 MCG/INH AEPB Inhale 1 puff into the lungs daily. 30 each 0  . nitroGLYCERIN (NITROSTAT) 0.4 MG SL tablet Place 1 tablet (0.4 mg total) under the tongue every 5 (five) minutes as needed for chest pain. (Patient not taking: Reported on 07/12/2019) 90 tablet 3   No current facility-administered medications for this visit.    PHYSICAL EXAMINATION: ECOG PERFORMANCE STATUS: 1-2  Vitals:   07/12/19 1210  BP: (!) 160/81  Pulse: 79  Resp: 18  Temp: 98 F (36.7 C)  SpO2: 97%   Filed Weights   07/12/19 1210  Weight: 114 lb 1.6 oz (51.8 kg)    GENERAL:alert, no distress and comfortable SKIN: no rash to exposed skin  EYES: sclera clear NECK: without mass LUNGS: diminished, with normal breathing effort HEART: Afib, no lower extremity edema ABDOMEN: normal bowel sounds. Abdomen tender  NEURO: alert & oriented x 3 with fluent speech  LABORATORY DATA:  I have reviewed the data as listed CBC Latest Ref Rng & Units 07/10/2019 06/28/2019 05/28/2019  WBC 4.0 - 10.5 K/uL 6.9 5.3 4.6  Hemoglobin 12.0 - 15.0 g/dL 11.8(L) 11.6(L) 12.5  Hematocrit 36 - 46 % 35.3(L) 34.5(L) 37.5  Platelets 150 - 400 K/uL 113(L) 165 194     CMP Latest Ref Rng & Units 07/10/2019 06/28/2019 05/28/2019  Glucose 70 - 99 mg/dL 115(H) 97 88  BUN 8 - 23 mg/dL '16 10 14  '$ Creatinine 0.44 - 1.00 mg/dL 1.13(H) 1.08(H) 1.16(H)  Sodium 135 - 145 mmol/L 133(L) 139 138  Potassium 3.5 - 5.1 mmol/L 3.8 4.4 4.4  Chloride 98 - 111 mmol/L 94(L) 103 100  CO2 22 - 32 mmol/L '26 25 28  '$ Calcium 8.9 - 10.3 mg/dL 8.8(L) 9.0 9.3  Total Protein 6.5 - 8.1 g/dL 7.2 6.6 6.9   Total Bilirubin 0.3 - 1.2 mg/dL 0.8 0.7 0.4  Alkaline Phos 38 - 126 U/L 122 142(H) 137(H)  AST 15 - 41 U/L 32 13(L) 13(L)  ALT 0 - 44 U/L '17 7 7      '$ RADIOGRAPHIC STUDIES: I have personally reviewed the radiological images as listed and agreed with the findings in the report. CT ABDOMEN PELVIS W CONTRAST  Result Date: 07/10/2019 CLINICAL DATA:  Pancreatic cancer, abdominal pain, recent endoscopy EXAM: CT  ABDOMEN AND PELVIS WITH CONTRAST TECHNIQUE: Multidetector CT imaging of the abdomen and pelvis was performed using the standard protocol following bolus administration of intravenous contrast. CONTRAST:  OMNIPAQUE IOHEXOL 300 MG/ML  SOLN COMPARISON:  05/28/2019 FINDINGS: Lower chest: No acute pleural or parenchymal lung disease. Hepatobiliary: No focal liver abnormality is seen. No gallstones, gallbladder wall thickening, or biliary dilatation. Pancreas: Interval postprocedural changes with fiduciary markers placed within the infiltrating mass involving the pancreatic body. Upstream pancreatic duct dilation within the pancreatic tail again noted unchanged. Fat stranding between the stomach and pancreatic mass likely related to recent transgastric biopsy. No evidence of fluid collection or hemorrhage. Spleen: Normal in size without focal abnormality. Adrenals/Urinary Tract: Adrenal glands are unremarkable. Kidneys are normal, without renal calculi, focal lesion, or hydronephrosis. Bladder is unremarkable. Stomach/Bowel: No bowel obstruction or ileus. No bowel wall thickening or inflammatory change. Vascular/Lymphatic: Extensive atherosclerosis of the aorta and its branches is again noted. There is extrinsic compression upon the portal vein, portal confluence, and splenic artery. The splenic vein is occluded near the porta splenic confluence, with numerous venous collaterals again identified. This is unchanged since recent exam. No discrete pathologic adenopathy identified. Reproductive: Status  post hysterectomy. No adnexal masses. Other: There is trace free fluid within the lower pelvis, simple in appearance. No free intraperitoneal gas. No abdominal wall hernia. Musculoskeletal: No acute or destructive bony lesions. Reconstructed images demonstrate no additional findings. IMPRESSION: 1. Postprocedural changes from transgastric pancreatic biopsy and fiduciary marker placement. No fluid collection or hemorrhage. 2. Stable pancreatic body mass consistent with adenocarcinoma. Extrinsic compression on the adjacent vasculature as described above, unchanged since previous exam. 3. No evidence of intra-abdominal or intrapelvic metastases. 4. Trace pelvic free fluid. 5.  Aortic Atherosclerosis (ICD10-I70.0). Electronically Signed   By: Sharlet Salina M.D.   On: 07/10/2019 22:54   DG Chest Portable 1 View  Result Date: 07/10/2019 CLINICAL DATA:  Epigastric pain EXAM: PORTABLE CHEST 1 VIEW COMPARISON:  12/21/2018 FINDINGS: There are atherosclerotic changes of the thoracic aorta. The heart size is stable. There is no pneumothorax. There is some atelectasis at the left lung base. No significant pleural effusion. IMPRESSION: No active disease. Electronically Signed   By: Katherine Mantle M.D.   On: 07/10/2019 22:14     ASSESSMENT & PLAN: 74 yo female with   1. Pancreatic neoplasm, [id number] -Allison Vaughan presented with gradual weight loss and abdominal discomfort with low back pain for 6-12 months, CA 19-9 is elevated to 250. 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 Path showed papillary neoplasm suspicious for malignancy, the differential includes well differentiated adeno vs IPMN. We discussed the limitations of FNA -Allison Vaughan had an appointment with  Dr. Donell Beers 05/24/19 who felt Allison Vaughan was likely a surgical candidate pending no distant metastasis.  -Case was discussed in GI tumor board and CT pancreatic protocol was recommended, done on 05/28/19 which unfortunately showed interval increase in size of poorly defined infiltrating pancreas tumor and progressive vascular involvement of the portal vein, portal venous confluence, splenic vein, and celiac artery.  CT chest was negative. -Unfortunately, due to progressive vascular involvement, Allison Vaughan is not a surgical candidate.  -her PS improved and Allison Vaughan underwent repeat EUS for tissue diagnosis and fiducial marker placement on 07/08/19 for future SBRT. Path showed malignant cells consistent with  adenocarcinoma.   2. Abdominal bloating, weight loss, low back pain -her abdominal bloating/discomfort and weight loss are c/w malignancy, has been gradual over 6-12 months. - Allison Vaughan weighed 150-160 lbs about 1.5 years ago. Allison Vaughan attributes some of her weight loss to grieving her son's death this year. Allison Vaughan has been able to gain some weight lately.  -her low back pain appears chronic, likely not entirely related to pancreatic mass -Allison Vaughan is on long term oxycodone 10 mg q6h, managed by her PCP Dr. Nancy Fetter -pain and weight are stable overall  -Allison Vaughan has worsening abdominal pain after EUS biopsy and fiducial placement. Allison Vaughan went to ED on 6/16. CT AP was stable, no acute process.   3. COPD -controlled, not oxygen dependent  -on albuterol PRN  -Allison Vaughan has 50 year history of smoking 1-2PPD, I strongly encouraged her to quit smoking, especially as Allison Vaughan is being considered for surgical resection. Allison Vaughan is willing to try to quit  -missed recent appointment with pulmonology  -continues to smoke, counseled on cessation   4. Anxiety and depression -Chronic, controlled; managed by PCP  -Mood is stable on fluoxetine and ativan at bedtime   5. Afib, CAD, HTN -followed by PCP; on apixaban and atorvastatin -in past few months stopped losartan  and metoprolol due to low BP, dizziness, and disorientation   6. Genetics   7. Goals of care -Allison Vaughan understands because her cancer is not operable it is likely not curable. Treatment goal is palliative.   Disposition:  Ms. Allison Vaughan appears stable. Allison Vaughan has worsening abdominal pain since EUS. Allison Vaughan went to ED for pain exacerbation, CT AP on 6/26 showed postprocedure changes from biopsy and fiducial placement, no metastatic disease or other acute abnormality in the abdomen or pelvis. Allison Vaughan is out of her usual pain medication from Dr. Nancy Fetter. Our office contacted him and he will see the patient to either refill her pain meds or bridge until next refill. The pain contract ends in December, he will continue to manage until then and re-evaluate. Allison Vaughan was given a dose of 10 mg oxycodone in clinic today for 8/10 pain and tenderness on exam.   We reviewed her EUS/biopsy from 07/08/19 which showed malignant cells consistent with adenocarcinoma. We discussed the general clinical course for pancreatic adenocarcinoma. Due to vascular invasion, Allison Vaughan is not a surgical candidate. We discussed other treatment options including chemo and/or radiation. Dr. Burr Medico recommends systemic moderate intensity chemotherapy with gemcitabine/abraxane (likely single agent gemcitabine with cycle 1) on days 1 and 8 q21 days, followed by SBRT.   Chemotherapy consent: Side effects including but not limited to fatigue, nausea, vomiting, diarrhea, hair loss, neuropathy, fluid retention, rash, fever, liver and kidney dysfunction, neutropenic fever, needfor blood transfusion, bleeding, were discussed with patient in great detail. Allison Vaughan agrees to proceed. Goal is palliative. Allison Vaughan will attend chemo class and agrees to port placement.   Due to her personal and family history of cancer, Allison Vaughan qualifies for genetics. If found to be BRCA positive Allison Vaughan would be a candidate for parp inhibitor. Allison Vaughan is interested. Allison Vaughan has family history of multiple cancers. Will refer  her to genetics to discuss appropriate testing.   Allison Vaughan agrees to port placement, will get that arranged with IR. Allison Vaughan will have chemo education this week or next. Allison Vaughan will return for lab and f/u with cycle 1 day 1 on 7/9.   Orders Placed This Encounter  Procedures  . IR Fluoro Guide CV Line Right    Indicate type of CVC ordering  Standing Status:   Future    Standing Expiration Date:   07/11/2020    Order Specific Question:   Reason for exam:    Answer:   [facility] for chemo    Order Specific Question:   Preferred Imaging Location?    Answer:   [facility]   All questions were answered. The patient knows to call the clinic with any problems, questions or concerns. No barriers to learning was detected.     Alla Feeling, NP 07/12/19   Addendum  I have seen the patient, examined her. I agree with the assessment and and plan and have edited the notes.   I discussed her recent biopsy result with pt and her daughter, which confirmed pancreatic adenocarcinoma. Allison Vaughan has localized but unresectable disease.  I discussed her cancer treatment is palliative, with the goal to prolong her life and reduce her cancer related symptoms.  I reviewed the goal of care in detail with patient and her daughter and Allison Vaughan voiced good understanding.  We discussed the systemic chemotherapy and or radiation.  I recommend chemo with gemcitabine and Abraxane (if Allison Vaughan is able to tolerated) for 3-6 months followed by SBRT for consolidation. Allison Vaughan agrees with the plan.  I will also refer her to genetics, and send her biopsy sample to [facility] (if sufficient tissue) to see if Allison Vaughan is a candidate for targeted therapy or immunotherapy.  We reviewed the benefit and side effects of chemotherapy, chemo consent was obtained.  We will arrange chemo class and port placement.  Plan to start next week.  All questions were answered, I spent a total of 40 minutes for her visit today, including care coordination.  Truitt Merle   6/28/20121

## 2019-07-12 ENCOUNTER — Encounter: Payer: Self-pay | Admitting: Nurse Practitioner

## 2019-07-12 ENCOUNTER — Other Ambulatory Visit: Payer: Self-pay

## 2019-07-12 ENCOUNTER — Inpatient Hospital Stay (HOSPITAL_BASED_OUTPATIENT_CLINIC_OR_DEPARTMENT_OTHER): Payer: Medicare HMO | Admitting: Nurse Practitioner

## 2019-07-12 ENCOUNTER — Telehealth: Payer: Self-pay | Admitting: Hematology

## 2019-07-12 DIAGNOSIS — C251 Malignant neoplasm of body of pancreas: Secondary | ICD-10-CM | POA: Diagnosis not present

## 2019-07-12 MED ORDER — OXYCODONE HCL 5 MG PO TABS
ORAL_TABLET | ORAL | Status: AC
  Filled 2019-07-12: qty 2

## 2019-07-12 MED ORDER — OXYCODONE HCL 5 MG PO TABS
10.0000 mg | ORAL_TABLET | Freq: Once | ORAL | Status: AC
  Administered 2019-07-12: 10 mg via ORAL

## 2019-07-12 NOTE — Telephone Encounter (Signed)
Scheduled per 6/28 los. Pt is aware of appt added on 6/30. Left voicemail with pt appt time and date on 7/9

## 2019-07-13 ENCOUNTER — Encounter: Payer: Self-pay | Admitting: Nurse Practitioner

## 2019-07-13 ENCOUNTER — Other Ambulatory Visit: Payer: Self-pay | Admitting: Hematology

## 2019-07-13 ENCOUNTER — Telehealth: Payer: Self-pay | Admitting: *Deleted

## 2019-07-13 DIAGNOSIS — Z7189 Other specified counseling: Secondary | ICD-10-CM | POA: Insufficient documentation

## 2019-07-13 DIAGNOSIS — C251 Malignant neoplasm of body of pancreas: Secondary | ICD-10-CM

## 2019-07-13 MED ORDER — LIDOCAINE-PRILOCAINE 2.5-2.5 % EX CREA: TOPICAL_CREAM | CUTANEOUS | 3 refills | Status: DC

## 2019-07-13 MED ORDER — ONDANSETRON HCL 8 MG PO TABS: 8.0000 mg | ORAL_TABLET | Freq: Two times a day (BID) | ORAL | 1 refills | Status: DC | PRN

## 2019-07-13 MED ORDER — PROCHLORPERAZINE MALEATE 10 MG PO TABS: 10.0000 mg | ORAL_TABLET | Freq: Four times a day (QID) | ORAL | 1 refills | Status: DC | PRN

## 2019-07-13 NOTE — Telephone Encounter (Signed)
   [facility] Pre-operative Risk Assessment    HEARTCARE STAFF: - Please ensure there is not already an duplicate clearance open for this procedure. - Under Visit Info/Reason for Call, type in Other and utilize the format Clearance MM/DD/YY or Clearance TBD. Do not use dashes or single digits. - If request is for dental extraction, please clarify the # of teeth to be extracted.  Request for surgical clearance:  1. What type of surgery is being performed?  PORT PLACEMENT    2. When is this surgery scheduled?  TBD   3. What type of clearance is required (medical clearance vs. Pharmacy clearance to hold med vs. Both)?  PHARMACY  4. Are there any medications that need to be held prior to surgery and how long?  ELIQUIS X'S 2 DAYS  5. Practice name and name of physician performing surgery?  [facility]   6. What is the office phone number?  [phone]   7.   What is the office fax number?  [phone]  8.   Anesthesia type (None, local, MAC, general) ?  BERSED / Lenn Sink 07/13/2019, 3:57 PM  _________________________________________________________________   (provider comments below)

## 2019-07-13 NOTE — Telephone Encounter (Signed)
Patient with diagnosis of afib on Eliquis for anticoagulation.    Procedure: PORT PLACEMENT   Date of procedure: TBD  CHADS2-VASc score of  6 (CHF, HTN, AGE, AGE, CAD, female)  CrCl 30.4 ml/min  Per office protocol, patient can hold Eliquis for 2 days prior to procedure.

## 2019-07-13 NOTE — Progress Notes (Signed)
START ON PATHWAY REGIMEN - Pancreatic Adenocarcinoma     A cycle is every 28 days:     Nab-paclitaxel (protein bound)      Gemcitabine   **Always confirm dose/schedule in your pharmacy ordering system**  Patient Characteristics: Locally Advanced, Anatomically Unresectable, First Line, PS ? 2, BRCA1/2 and PALB2 Mutation Absent/Unknown, Chemotherapy Therapeutic Status: Locally Advanced, Anatomically Unresectable Line of Therapy: First Line ECOG Performance Status: 2 BRCA1/2 Mutation Status: Awaiting Test Results PALB2 Mutation Status: Awaiting Test Results Intent of Therapy: Non-Curative / Palliative Intent, Discussed with Patient

## 2019-07-13 NOTE — Telephone Encounter (Signed)
   Primary Cardiologist: Pixie Casino, MD  Chart reviewed as part of pre-operative protocol coverage. As outlined below, per office protocol, patient can hold Eliquis for 2 days prior to procedure. Regarding medical clearance, procedure itself is low risk and in clinical context, there are no specific cardiac concerns about proceeding.  I will route this recommendation to the requesting party via Epic fax function and remove from pre-op pool.  Please call with questions.  Charlie Pitter, PA-C 07/13/2019, 5:04 PM

## 2019-07-13 NOTE — Telephone Encounter (Signed)
   Primary Cardiologist: Pixie Casino, MD  Chart reviewed as part of pre-operative protocol coverage. Patient was contacted 07/13/2019 in reference to pre-operative risk assessment for pending surgery as outlined below.  Allison Vaughan was last seen on 01/2019 by Roby Lofts PA-C. 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 She has chronic dyspnea.  Ischemic evaluations have been deferred out of concern for delaying care for pancreatic neoplasm. She is followed by oncology with plan to start chemotherapy with port placement per notes. Goal is palliative. Clearly patient needs port placement to expedite care so will route to pharm then plan to finalize clearance. I did try to call patient x2 to ensure no new cardiac sx we could assist with but only rang multiple times each time.  Charlie Pitter, PA-C 07/13/2019, 4:42 PM

## 2019-07-14 ENCOUNTER — Telehealth: Payer: Self-pay | Admitting: *Deleted

## 2019-07-14 ENCOUNTER — Inpatient Hospital Stay: Payer: Medicare HMO

## 2019-07-16 NOTE — Progress Notes (Signed)
[facility]   Telephone:(336) [phone] Fax:(336) [phone]   Clinic Follow up Note   Patient Care Team: Salli Real, MD as PCP - General (Internal Medicine) Rennis Golden Lisette Abu, MD as PCP - Cardiology (Cardiology) Radonna Ricker, RN as Oncology Nurse Navigator Pollyann Samples, NP as Nurse Practitioner (Nurse Practitioner) Malachy Mood, MD as Consulting Physician (Hematology) Mansouraty, Netty Starring., MD as Consulting Physician (Gastroenterology)  Date of Service:  07/23/2019  CHIEF COMPLAINT: F/u pancreatic cancer   SUMMARY OF ONCOLOGIC HISTORY: Oncology History Overview Note  Cancer Staging Primary adenocarcinoma of body of pancreas [facility]) Staging form: Exocrine Pancreas, AJCC 8th Edition - Clinical stage from 07/08/2019: Stage III (cT4, cN1, cM0) - Signed by Malachy Mood, MD on 07/13/2019    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HCC)  12/21/2018 Imaging   CT AP w contrast IMPRESSION: 1. Large amount of stool at the level of the rectum. Correlate for symptoms of fecal impaction. 2. Persistent dilatation of the distal pancreatic duct with abrupt cutoff at the level of the pancreatic body. Further evaluation with a nonemergent outpatient contrast enhanced MRI is recommended. 3. Nonobstructive left nephrolithiasis. 4. Subacute anterior right rib fractures.   Aortic Atherosclerosis (ICD10-I70.0).   12/24/2018 Imaging   MRCP IMPRESSION: 1. Suspect a 3.5 cm infiltrating pancreatic body neoplasm causing obstruction of the main pancreatic duct and atrophy of the pancreatic tail. The pancreatic head appears normal. 2. Limited examination due to lack of IV contrast and breathing motion artifact. Difficult to assess the vascular structures for invasion. 3. Recommend endoscopic biopsy. A dedicated high-resolution pancreatic CT scan with contrast may be helpful for further evaluation. 4. No obvious metastatic disease.   12/26/2018 Tumor Marker   CA 19-9: 250     05/05/2019 Procedure   EUS by Dr. Dulce Sellar An irregular mass was identified in the pancreatic body. The mass was hypoechoic. The mass measured 24 mm by 17 mm in maximal cross-sectional diameter. The endosonographic borders were poorlydefined. The remainder of the pancreas was examined. The endosonographic appearance of parenchyma and the upstream pancreatic duct indicated duct dilation. Appeared to be tissue ingrowth into portions of the dilated pancreatic duct. Fine needle aspiration for cytology was performed.  A few abnormal lymph nodes were visualized in the peripancreatic region. The nodes were oval, hypoechoic and had well defined margins. IMPRESSION - A mass was identified in the pancreatic body. This was staged T3 N1 Mx byendosonographic criteria. Fine needle aspiration performed. 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 - A few abnormal lymph nodes were visualized in the peripancreatic region. - Pancreatic parenchymal abnormalities consisting of edema and stranding were noted in the genu of the pancreas and pancreatic body. - There was no evidence of significant pathology.   05/29/2019 Imaging   CT CAP w contrast IMPRESSION: 1. Interval increase in size of poorly defined, hypoenhancing, infiltrating tumor arising from the body and proximal tail of pancreas. 2. Progressive vascular involvement of the portal vein, portal venous confluence, splenic vein, and celiac artery. 3. No specific features of solid organ metastases or metastatic disease to the chest. 4. Similar appearance of tiny, upper lung zone predominant solid and ground-glass attenuating nodules which may be postinflammatory/infectious in etiology. 5. Aortic atherosclerosis, in addition to 3 vessel coronary artery disease. Please note that although the presence of coronary artery calcium documents the presence of coronary artery disease, the severity of this disease and any potential  stenosis cannot be assessed on this non-gated CT examination. Assessment  for potential risk factor modification, dietary therapy or pharmacologic therapy may be warranted, if clinically indicated.   Aortic Atherosclerosis (ICD10-I70.0).   07/08/2019 Procedure   Repeat EUS for biopsy and fiducial placement by Dr. Ardis Hughs   1. Irregularly shaped, heterogeneous, hypoechoic mass was noted in the body of the pancreas, measuring 2.7cm across. The mass clearly invades the portal vein and obstructs the main pancreatic duct causing upstream dilation (5-42m in the body and tail). Preliminary cytology review was positive for malignancy (adenocarcinoma) and then 4 gold fiducials were placed into the mass using the Ce[employer]placement system. 2. CBD was normal, non-dilated. 3. Limited views of the liver, spleen were normal.      07/08/2019 Pathology Results   FINAL MICROSCOPIC DIAGNOSIS:  - Malignant cells consistent with adenocarcinoma    07/08/2019 Cancer Staging   Staging form: Exocrine Pancreas, AJCC 8th Edition - Clinical stage from 07/08/2019: Stage III (cT4, cN1, cM0) - Signed by FTruitt Merle MD on 07/13/2019   07/12/2019 Initial Diagnosis   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[place]   07/23/2019 -  Chemotherapy   First-line Gemcitabine and Abraxane 2 weeks on/1 week off starting 07/23/19 with Gemcitabine alone for C1.      CURRENT THERAPY:  First-line Gemcitabine and Abraxane 2 weeks on/1 week off starting 07/23/19 with Gemcitabine alone for C1.  INTERVAL HISTORY:  [name] here for a follow up. She presents to the clinic alone. She notes she is hard of hearing and does not have hearing aids for this. She also notes decreased short term memory which he daughters help her with.  Her PAC was not placed this week but she will proceed with placement next week. She feels she can proceed with chemo today peripherally. Her pain specialist is her PCP Dr SNancy Fetterwho is  managing her overall care. She notes he has her only on Oxycodone q6hours. She notes he does not plan to titrate up her medications given her cancer diagnosis and suggested she f/u with cancer center for more medication. She has been on Oxycodone for her neuropathy in her feet, but now has abdominal pain from cancer.     REVIEW OF SYSTEMS:   Constitutional: Denies fevers, chills or abnormal weight loss Eyes: Denies blurriness of vision Ears, nose, mouth, throat, and face: Denies mucositis or sore throat (+) Hard of hearing  Respiratory: Denies cough, dyspnea or wheezes Cardiovascular: Denies palpitation, chest discomfort or lower extremity swelling Gastrointestinal:  Denies nausea, heartburn or change in bowel habits (+) abdominal pain  Skin: Denies abnormal skin rashes Lymphatics: Denies new lymphadenopathy or easy bruising Neurological: (+) Stable neuropathy in feet Behavioral/Psych: Mood is stable, no new changes  All other systems were reviewed with the patient and are negative.  MEDICAL HISTORY:  Past Medical History:  Diagnosis Date  . Accidental drug overdose   . Acute encephalopathy   . Acute on chronic respiratory failure with hypoxia ([place] 03/01/2017  . Acute renal failure ([place] 04/16/2014  . AKI (acute kidney injury) ([place] 04/30/2014  . Altered mental status 06/27/2012  . Anxiety   . Atrial fibrillation ([place]   . CAD (coronary artery disease)   . CAP (community acquired pneumonia) 04/18/2014  . Cardiomyopathy ([facility]   . Chronic hypoxemic respiratory failure ([place] 06/26/2012  . Chronic pain   . COPD exacerbation ([place] 06/26/2012  . COPD with acute exacerbation ([place] 03/01/2017  . DEPRESSIVE DISORDER, NOS 03/13/2006   Qualifier: Diagnosis of  By: DDamita DunningsMD, GPhillip Heal   .  Edema, peripheral   . Essential hypertension 04/30/2014  . Fall at home, initial encounter 03/01/2017  . GASTROESOPHAGEAL REFLUX, NO ESOPHAGITIS 03/13/2006   Qualifier: Diagnosis of  By: Para March MD, Cheree Ditto    .  HYPERCHOLESTEROLEMIA 03/13/2006   Qualifier: Diagnosis of  By: Para March MD, Cheree Ditto    . Hyperlipidemia   . HYPERTENSION, BENIGN SYSTEMIC 03/13/2006   Qualifier: Diagnosis of  By: Para March MD, Cheree Ditto    . Hypokalemia 04/30/2014  . Hyponatremia 04/30/2014  . Hypotension 04/30/2014  . Hypoxemia 06/26/2012  . Hypoxia 03/26/2018  . Irritable bowel syndrome 03/13/2006   Qualifier: Diagnosis of  By: Para March MD, Cheree Ditto    . Lung nodule 04/16/2014  . MENOPAUSAL SYNDROME 03/13/2006   Qualifier: Diagnosis of  By: Para March MD, Cheree Ditto    . Mood disorder [facility]) 03/13/2006   Qualifier: Diagnosis of  By: Para March MD, Cheree Ditto    . 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 Nausea 04/30/2014  . Nausea vomiting and diarrhea 04/16/2014  . OSTEOARTHRITIS, MULTI SITES 03/13/2006   Qualifier: Diagnosis of  By: Para March MD, Cheree Ditto    . Pulmonary nodule 04/30/2014  . Respiratory failure (HCC) 01/22/2017  . TOBACCO DEPENDENCE 03/13/2006   Qualifier: Diagnosis of  By: Para March MD, Cheree Ditto    . Weight loss     SURGICAL HISTORY: Past Surgical History:  Procedure Laterality Date  . ESOPHAGOGASTRODUODENOSCOPY N/A 07/08/2019   Procedure: ESOPHAGOGASTRODUODENOSCOPY (EGD);  Surgeon: Rachael Fee, MD;  Location: [facility];  Service: Endoscopy;  Laterality: N/A;  . ESOPHAGOGASTRODUODENOSCOPY (EGD) WITH PROPOFOL N/A 05/05/2019   Procedure: ESOPHAGOGASTRODUODENOSCOPY (EGD) WITH PROPOFOL;  Surgeon: Willis Modena, MD;  Location: [facility];  Service: Endoscopy;  Laterality: N/A;  . EUS N/A 07/08/2019   Procedure: UPPER ENDOSCOPIC ULTRASOUND (EUS) RADIAL;  Surgeon: Rachael Fee, MD;  Location: [facility];  Service: Endoscopy;  Laterality: N/A;  . EYE SURGERY    . FIDUCIAL MARKER PLACEMENT N/A 07/08/2019   Procedure: FIDUCIAL MARKER PLACEMENT;  Surgeon: Rachael Fee, MD;  Location: [facility];  Service: Endoscopy;  Laterality: N/A;  . FINE NEEDLE ASPIRATION N/A 05/05/2019   Procedure: FINE NEEDLE ASPIRATION (FNA) LINEAR;  Surgeon:  Willis Modena, MD;  Location: [facility];  Service: Endoscopy;  Laterality: N/A;  . FINE NEEDLE ASPIRATION N/A 07/08/2019   Procedure: FINE NEEDLE ASPIRATION (FNA) LINEAR;  Surgeon: Rachael Fee, MD;  Location: [facility];  Service: Endoscopy;  Laterality: N/A;  . ROTATOR CUFF REPAIR Right 2012  . UPPER ESOPHAGEAL ENDOSCOPIC ULTRASOUND (EUS) N/A 05/05/2019   Procedure: UPPER ESOPHAGEAL ENDOSCOPIC ULTRASOUND (EUS) with FNA;  Surgeon: Willis Modena, MD;  Location: [facility];  Service: Endoscopy;  Laterality: N/A;    I have reviewed the social history and family history with the patient and they are unchanged from previous note.  ALLERGIES:  is allergic to compazine [prochlorperazine edisylate].  MEDICATIONS:  Current Outpatient Medications  Medication Sig Dispense Refill  . albuterol (PROVENTIL HFA;VENTOLIN HFA) 108 (90 BASE) MCG/ACT inhaler Inhale 2 puffs into the lungs every 6 (six) hours as needed for wheezing. 1 Inhaler 2  . albuterol (PROVENTIL) (2.5 MG/3ML) 0.083% nebulizer solution Take 3 mLs (2.5 mg total) by nebulization every 2 (two) hours as needed for wheezing. 75 mL 12  . apixaban (ELIQUIS) 5 MG TABS tablet Take 1 tablet (5 mg total) by mouth 2 (two) times daily. (Patient taking differently: Take 5 mg by mouth daily. ) 180 tablet 3  . Aspirin-Salicylamide-Caffeine (BC HEADACHE PO) Take 1 packet by mouth 2 (two)  times daily as needed (headaches).     Marland Kitchen atorvastatin (LIPITOR) 10 MG tablet Take 1 tablet (10 mg total) by mouth at bedtime. (Patient taking differently: Take 10 mg by mouth every evening. ) 30 tablet 0  . FLUoxetine (PROZAC) 40 MG capsule Take 40 mg by mouth every evening.     . lidocaine-prilocaine (EMLA) cream Apply to affected area as needed weekly 30 g 3  . LORazepam (ATIVAN) 0.5 MG tablet Take 0.5 mg by mouth at bedtime.     Marland Kitchen losartan (COZAAR) 25 MG tablet Take 1 tablet (25 mg total) by mouth daily. (Patient taking differently: Take 25 mg by mouth every  evening. ) 90 tablet 3  . metoprolol succinate (TOPROL-XL) 25 MG 24 hr tablet Take 2 tablets (50 mg total) by mouth daily. Take with or immediately following a meal. (Patient taking differently: Take 50 mg by mouth every evening. Take with or immediately following a meal.) 90 tablet 3  . naproxen sodium (ALEVE) 220 MG tablet Take 220 mg by mouth daily as needed (headache).    . nitroGLYCERIN (NITROSTAT) 0.4 MG SL tablet Place 1 tablet (0.4 mg total) under the tongue every 5 (five) minutes as needed for chest pain. (Patient not taking: Reported on 07/12/2019) 90 tablet 3  . Nutritional Supplements (ENSURE ENLIVE PO) Take 237 mLs by mouth daily as needed (when want too).     Marland Kitchen omeprazole (PRILOSEC) 40 MG capsule Take 40 mg by mouth every evening.     . ondansetron (ZOFRAN ODT) 4 MG disintegrating tablet Take 1 tablet (4 mg total) by mouth every 6 (six) hours as needed for nausea or vomiting. 30 tablet 0  . ondansetron (ZOFRAN) 8 MG tablet Take 1 tablet (8 mg total) by mouth 2 (two) times daily as needed (Nausea or vomiting). 30 tablet 1  . oxyCODONE 10 MG TABS Take 1 tablet (10 mg total) by mouth every 6 (six) hours as needed for severe pain. 20 tablet 0  . potassium chloride (KLOR-CON) 10 MEQ tablet Take 10 mEq by mouth every evening.     . prochlorperazine (COMPAZINE) 10 MG tablet Take 1 tablet (10 mg total) by mouth every 6 (six) hours as needed (Nausea or vomiting). 30 tablet 1  . umeclidinium bromide (INCRUSE ELLIPTA) 62.5 MCG/INH AEPB Inhale 1 puff into the lungs daily. 30 each 0   No current facility-administered medications for this visit.    PHYSICAL EXAMINATION: ECOG PERFORMANCE STATUS: 1 - Symptomatic but completely ambulatory  Vitals:   07/23/19 1244  BP: (!) 174/75  Pulse: 72  Resp: 18  Temp: 98.1 F (36.7 C)  SpO2: 96%   Filed Weights   07/23/19 1244  Weight: 110 lb 12.8 oz (50.3 kg)    Due to COVID19 we will limit examination to appearance. Patient had no complaints.   GENERAL:alert, no distress and comfortable SKIN: skin color normal, no rashes or significant lesions EYES: normal, Conjunctiva are pink and non-injected, sclera clear  NEURO: alert & oriented x 3 with fluent speech   LABORATORY DATA:  I have reviewed the data as listed CBC Latest Ref Rng & Units 07/23/2019 07/10/2019 06/28/2019  WBC 4.0 - 10.5 K/uL 4.6 6.9 5.3  Hemoglobin 12.0 - 15.0 g/dL 11.4(L) 11.8(L) 11.6(L)  Hematocrit 36 - 46 % 35.6(L) 35.3(L) 34.5(L)  Platelets 150 - 400 K/uL 236 113(L) 165     CMP Latest Ref Rng & Units 07/23/2019 07/10/2019 06/28/2019  Glucose 70 - 99 mg/dL 86 115(H) 97  BUN  8 - 23 mg/dL '11 16 10  '$ Creatinine 0.44 - 1.00 mg/dL 1.21(H) 1.13(H) 1.08(H)  Sodium 135 - 145 mmol/L 135 133(L) 139  Potassium 3.5 - 5.1 mmol/L 4.5 3.8 4.4  Chloride 98 - 111 mmol/L 101 94(L) 103  CO2 22 - 32 mmol/L '22 26 25  '$ Calcium 8.9 - 10.3 mg/dL 9.0 8.8(L) 9.0  Total Protein 6.5 - 8.1 g/dL 7.0 7.2 6.6  Total Bilirubin 0.3 - 1.2 mg/dL 0.4 0.8 0.7  Alkaline Phos 38 - 126 U/L 137(H) 122 142(H)  AST 15 - 41 U/L 16 32 13(L)  ALT 0 - 44 U/L '6 17 7      '$ RADIOGRAPHIC STUDIES: I have personally reviewed the radiological images as listed and agreed with the findings in the report. No results found.   ASSESSMENT & PLAN:  Allison Vaughan is a 74 y.o. female with    1. Pancreatic neoplasm, cT4N1M0, unresectable -She initially presented with gradual weight loss and abdominal discomfort with low back pain for 6-12 months, CA 19-9 is elevated to 250. 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  -Her EUS/biopsy from 07/08/19 showed malignant cells consistent with adenocarcinoma. We discussed the general clinical course for pancreatic adenocarcinoma. Due to vascular invasion, she is not a surgical candidate thus her cancer is no longer curable but still  treatable to control disease and prolong her life.  -I discussed systemic moderate intensity chemotherapy with gemcitabine/Abraxane (likely single agent gemcitabine with cycle 1) on days 1 and 8 q21 days, followed by SBRT. Will start chemo on 07/23/19 with Gemcitabine alone for C1.  -She will proceed with PAC placement next week.  -Will proceed with labs before proceeding with C1 chemo today.  -f/u next week for day 8 chemo. I encouraged her to contact clinic with any concerns  -will check MMR on her last cytology sample if there is enough material for test, to see if she is a candidate for immunotherapy -She is scheduled to see genetics on 7/19   2. Abdominal bloating, weight loss, low back pain -Her abdominal bloating/discomfort and weight loss are secondary to malignancy, has been gradual over 6-12 months. -She weighed 150-160 lbs about 1.5 years ago. She attributes some of her weight loss to grieving her son's death in 04-12-19. She has been able to gain some weight lately.  -She has had chronic back pain and neuropathy in her feet which has been managed on Oxycodone '10mg'$  q6hours by her PCP Dr Nancy Fetter.  -She has started to have abdominal pain secondary to her cancer. Per pt Dr Nancy Fetter does not plan to titrate up her medication and deferred to our clinic for additional medication. I discussed referring her to Dr Nancy Nordmann from palliative care for pain management moving forward.   3. Comorbidities: COPD, Anxiety and depression, Afib, CAD, HTN -controlled, not oxygen dependent. On albuterol PRN  -She has 50 year history of smoking 1-2PPD. She continues to smoke, counseled on cessation. -Anxiety/depression is chronic, controlled. Her mood is stable on fluoxetine and ativan nightly -in past few months stopped losartan and metoprolol due to low BP, dizziness, and disorientation  4. Goals of care -she understands because her cancer is not operable it is likely not curable. Treatment goal is palliative.    5. Genetic Testing  -Due to her personal and family history of cancer, she qualifies for genetics. If found to be BRCA positive she would be a  candidate for parp inhibitor. She is interested. She has family history of multiple cancers. She was previously referred to genetics to discuss appropriate testing.    PLAN:  -Labs reviewed and adequate to proceed with C1D1 Gemcitabine today  -PAC placement next week -Lab, flush, f/u and Gemcitabine next week -request MMR on her last biopsy sample    No problem-specific Assessment & Plan notes found for this encounter.   No orders of the defined types were placed in this encounter.  All questions were answered. The patient knows to call the clinic with any problems, questions or concerns. No barriers to learning was detected. The total time spent in the appointment was 30 minutes.     Truitt Merle, MD 07/23/2019   I, Joslyn Devon, am acting as scribe for Truitt Merle, MD.   I have reviewed the above documentation for accuracy and completeness, and I agree with the above.

## 2019-07-16 NOTE — Progress Notes (Signed)
Pharmacist Chemotherapy Monitoring - Initial Assessment    Anticipated start date: 07/23/19  Regimen:  . Are orders appropriate based on the patient's diagnosis, regimen, and cycle? Yes . Does the plan date match the patient's scheduled date? Yes . Is the sequencing of drugs appropriate? Yes . Are the premedications appropriate for the patient's regimen? Yes . Prior Authorization for treatment is: Approved o If applicable, is the correct biosimilar selected based on the patient's insurance? not applicable  Organ Function and Labs: Marland Kitchen Are dose adjustments needed based on the patient's renal function, hepatic function, or hematologic function? Yes . Are appropriate labs ordered prior to the start of patient's treatment? Yes . Other organ system assessment, if indicated: N/A . The following baseline labs, if indicated, have been ordered: N/A  Dose Assessment: . Are the drug doses appropriate? Yes . Are the following correct: o Drug concentrations Yes o IV fluid compatible with drug Yes o Administration routes Yes o Timing of therapy Yes . If applicable, does the patient have documented access for treatment and/or plans for port-a-cath placement? yes . If applicable, have lifetime cumulative doses been properly documented and assessed? not applicable Lifetime Dose Tracking  No doses have been documented on this patient for the following tracked chemicals: Doxorubicin, Epirubicin, Idarubicin, Daunorubicin, Mitoxantrone, Bleomycin, Oxaliplatin, Carboplatin, Liposomal Doxorubicin  o   Toxicity Monitoring/Prevention: . The patient has the following take home antiemetics prescribed: Ondansetron . The patient has the following take home medications prescribed: N/A . Medication allergies and previous infusion related reactions, if applicable, have been reviewed and addressed. Yes . The patient's current medication list has been assessed for drug-drug interactions with their chemotherapy regimen.  no significant drug-drug interactions were identified on review.  Order Review: . Are the treatment plan orders signed? Yes . Is the patient scheduled to see a provider prior to their treatment? Yes  I verify that I have reviewed each item in the above checklist and answered each question accordingly.  Philomena Course 07/16/2019 12:45 PM

## 2019-07-20 ENCOUNTER — Other Ambulatory Visit: Payer: Self-pay | Admitting: Physician Assistant

## 2019-07-21 ENCOUNTER — Observation Stay (HOSPITAL_COMMUNITY): Payer: Medicare HMO

## 2019-07-21 ENCOUNTER — Institutional Professional Consult (permissible substitution): Payer: Medicare HMO | Admitting: Critical Care Medicine

## 2019-07-21 ENCOUNTER — Other Ambulatory Visit (HOSPITAL_COMMUNITY): Payer: Medicare HMO

## 2019-07-21 NOTE — Progress Notes (Deleted)
Synopsis: Referred in July 2021 for COPD by Stark Klein, MD.  Subjective:   PATIENT ID: Allison Vaughan GENDER: female DOB: [birth year], MRN: [id number]  No chief complaint on file.   HPI   COPD, pre-op clearance? Not a surg candidate per Ed notes?  Pancreatic cancer-palliative chemo with paclitaxel and gemcitabine    Past Medical History:  Diagnosis Date  . Accidental drug overdose   . Acute encephalopathy   . Acute on chronic respiratory failure with hypoxia ([place]) 03/01/2017  . Acute renal failure ([place]) 04/16/2014  . AKI (acute kidney injury) ([place]) 04/30/2014  . Altered mental status 06/27/2012  . Anxiety   . Atrial fibrillation ([place])   . CAD (coronary artery disease)   . CAP (community acquired pneumonia) 04/18/2014  . Cardiomyopathy ([place])   . Chronic hypoxemic respiratory failure ([place]) 06/26/2012  . Chronic pain   . COPD exacerbation ([place]) 06/26/2012  . COPD with acute exacerbation ([place]) 03/01/2017  . DEPRESSIVE DISORDER, NOS 03/13/2006   Qualifier: Diagnosis of  By: Damita Dunnings MD, Phillip Heal    . Edema, peripheral   . Essential hypertension 04/30/2014  . Fall at home, initial encounter 03/01/2017  . GASTROESOPHAGEAL REFLUX, NO ESOPHAGITIS 03/13/2006   Qualifier: Diagnosis of  By: Damita Dunnings MD, Phillip Heal    . HYPERCHOLESTEROLEMIA 03/13/2006   Qualifier: Diagnosis of  By: Damita Dunnings MD, Phillip Heal    . Hyperlipidemia   . HYPERTENSION, BENIGN SYSTEMIC 03/13/2006   Qualifier: Diagnosis of  By: Damita Dunnings MD, Phillip Heal    . Hypokalemia 04/30/2014  . Hyponatremia 04/30/2014  . Hypotension 04/30/2014  . Hypoxemia 06/26/2012  . Hypoxia 03/26/2018  . Irritable bowel syndrome 03/13/2006   Qualifier: Diagnosis of  By: Damita Dunnings MD, Phillip Heal    . Lung nodule 04/16/2014  . MENOPAUSAL SYNDROME 03/13/2006   Qualifier: Diagnosis of  By: Damita Dunnings MD, Phillip Heal    . Mood disorder [facility]) 03/13/2006   Qualifier: Diagnosis of  By: Damita Dunnings MD, Phillip Heal    . 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 Nausea 04/30/2014  . Nausea vomiting  and diarrhea 04/16/2014  . OSTEOARTHRITIS, MULTI SITES 03/13/2006   Qualifier: Diagnosis of  By: Damita Dunnings MD, Phillip Heal    . Pulmonary nodule 04/30/2014  . Respiratory failure ([place]) 01/22/2017  . TOBACCO DEPENDENCE 03/13/2006   Qualifier: Diagnosis of  By: Damita Dunnings MD, Phillip Heal    . Weight loss      Family History  Problem Relation Age of Onset  . CAD Father   . Lung cancer Brother   . Cancer Brother        lung ca  . Cancer Brother        possible pancreas cancer   . Healthy Daughter   . Healthy Daughter   . Cancer Maternal Grandmother        breast  . Cancer Paternal Grandmother        breast     Past Surgical History:  Procedure Laterality Date  . ESOPHAGOGASTRODUODENOSCOPY N/A 07/08/2019   Procedure: ESOPHAGOGASTRODUODENOSCOPY (EGD);  Surgeon: Milus Banister, MD;  Location: [facility];  Service: Endoscopy;  Laterality: N/A;  . ESOPHAGOGASTRODUODENOSCOPY (EGD) WITH PROPOFOL N/A 05/05/2019   Procedure: ESOPHAGOGASTRODUODENOSCOPY (EGD) WITH PROPOFOL;  Surgeon: Arta Silence, MD;  Location: [facility];  Service: Endoscopy;  Laterality: N/A;  . EUS N/A 07/08/2019   Procedure: UPPER ENDOSCOPIC ULTRASOUND (EUS) RADIAL;  Surgeon: Milus Banister, MD;  Location: [facility];  Service: Endoscopy;  Laterality: N/A;  . EYE SURGERY    . FIDUCIAL MARKER PLACEMENT N/A  07/08/2019   Procedure: FIDUCIAL MARKER PLACEMENT;  Surgeon: Milus Banister, MD;  Location: [facility];  Service: Endoscopy;  Laterality: N/A;  . FINE NEEDLE ASPIRATION N/A 05/05/2019   Procedure: FINE NEEDLE ASPIRATION (FNA) LINEAR;  Surgeon: Arta Silence, MD;  Location: [facility];  Service: Endoscopy;  Laterality: N/A;  . FINE NEEDLE ASPIRATION N/A 07/08/2019   Procedure: FINE NEEDLE ASPIRATION (FNA) LINEAR;  Surgeon: Milus Banister, MD;  Location: [facility];  Service: Endoscopy;  Laterality: N/A;  . ROTATOR CUFF REPAIR Right April 29, 2010  . UPPER ESOPHAGEAL ENDOSCOPIC ULTRASOUND (EUS) N/A 05/05/2019   Procedure: UPPER  ESOPHAGEAL ENDOSCOPIC ULTRASOUND (EUS) with FNA;  Surgeon: Arta Silence, MD;  Location: [facility];  Service: Endoscopy;  Laterality: N/A;    Social History   Socioeconomic History  . Marital status: Single    Spouse name: Not on file  . Number of children: 3  . Years of education: Not on file  . Highest education level: Not on file  Occupational History  . Not on file  Tobacco Use  . Smoking status: Current Every Day Smoker    Packs/day: 1.50    Years: 50.00    Pack years: 75.00    Types: Cigarettes  . Smokeless tobacco: Never Used  . Tobacco comment: states she is trying to quit  Vaping Use  . Vaping Use: Never used  Substance and Sexual Activity  . Alcohol use: Not Currently    Comment: drank alcohol daily x20 years, quit 10 years ago  . Drug use: No  . Sexual activity: Not on file  Other Topics Concern  . Not on file  Social History Narrative   Not working, lives with one daughter   Has 3 children, 2 daughters and 1 son; son died early 04/29/19 secondary to liver failure from drug-use related Hep C   Social Determinants of Health   Financial Resource Strain:   . Difficulty of Paying Living Expenses:   Food Insecurity:   . Worried About Charity fundraiser in the Last Year:   . Arboriculturist in the Last Year:   Transportation Needs:   . Film/video editor (Medical):   Marland Kitchen Lack of Transportation (Non-Medical):   Physical Activity:   . Days of Exercise per Week:   . Minutes of Exercise per Session:   Stress:   . Feeling of Stress :   Social Connections:   . Frequency of Communication with Friends and Family:   . Frequency of Social Gatherings with Friends and Family:   . Attends Religious Services:   . Active Member of Clubs or Organizations:   . Attends Archivist Meetings:   Marland Kitchen Marital Status:   Intimate Partner Violence:   . Fear of Current or Ex-Partner:   . Emotionally Abused:   Marland Kitchen Physically Abused:   . Sexually Abused:      No Known  Allergies   Immunization History  Administered Date(s) Administered  . Influenza, High Dose Seasonal PF 01/24/2017  . Pneumococcal Polysaccharide-23 06/27/2012, 01/24/2017  . Tdap 06/08/2018    Outpatient Medications Prior to Visit  Medication Sig Dispense Refill  . albuterol (PROVENTIL HFA;VENTOLIN HFA) 108 (90 BASE) MCG/ACT inhaler Inhale 2 puffs into the lungs every 6 (six) hours as needed for wheezing. 1 Inhaler 2  . albuterol (PROVENTIL) (2.5 MG/3ML) 0.083% nebulizer solution Take 3 mLs (2.5 mg total) by nebulization every 2 (two) hours as needed for wheezing. 75 mL 12  . apixaban (ELIQUIS) 5  MG TABS tablet Take 1 tablet (5 mg total) by mouth 2 (two) times daily. (Patient taking differently: Take 5 mg by mouth daily. ) 180 tablet 3  . Aspirin-Salicylamide-Caffeine (BC HEADACHE PO) Take 1 packet by mouth 2 (two) times daily as needed (headaches).     Marland Kitchen atorvastatin (LIPITOR) 10 MG tablet Take 1 tablet (10 mg total) by mouth at bedtime. (Patient taking differently: Take 10 mg by mouth every evening. ) 30 tablet 0  . FLUoxetine (PROZAC) 40 MG capsule Take 40 mg by mouth every evening.     . lidocaine-prilocaine (EMLA) cream Apply to affected area once 30 g 3  . LORazepam (ATIVAN) 0.5 MG tablet Take 0.5 mg by mouth at bedtime.     Marland Kitchen losartan (COZAAR) 25 MG tablet Take 1 tablet (25 mg total) by mouth daily. (Patient taking differently: Take 25 mg by mouth every evening. ) 90 tablet 3  . metoprolol succinate (TOPROL-XL) 25 MG 24 hr tablet Take 2 tablets (50 mg total) by mouth daily. Take with or immediately following a meal. (Patient taking differently: Take 50 mg by mouth every evening. Take with or immediately following a meal.) 90 tablet 3  . naproxen sodium (ALEVE) 220 MG tablet Take 220 mg by mouth daily as needed (headache).    . nitroGLYCERIN (NITROSTAT) 0.4 MG SL tablet Place 1 tablet (0.4 mg total) under the tongue every 5 (five) minutes as needed for chest pain. (Patient not taking:  Reported on 07/12/2019) 90 tablet 3  . Nutritional Supplements (ENSURE ENLIVE PO) Take 237 mLs by mouth daily as needed (when want too).     Marland Kitchen omeprazole (PRILOSEC) 40 MG capsule Take 40 mg by mouth every evening.     . ondansetron (ZOFRAN ODT) 4 MG disintegrating tablet Take 1 tablet (4 mg total) by mouth every 6 (six) hours as needed for nausea or vomiting. 30 tablet 0  . ondansetron (ZOFRAN) 8 MG tablet Take 1 tablet (8 mg total) by mouth 2 (two) times daily as needed (Nausea or vomiting). 30 tablet 1  . oxyCODONE 10 MG TABS Take 1 tablet (10 mg total) by mouth every 6 (six) hours as needed for severe pain. 20 tablet 0  . potassium chloride (KLOR-CON) 10 MEQ tablet Take 10 mEq by mouth every evening.     . prochlorperazine (COMPAZINE) 10 MG tablet Take 1 tablet (10 mg total) by mouth every 6 (six) hours as needed (Nausea or vomiting). 30 tablet 1  . umeclidinium bromide (INCRUSE ELLIPTA) 62.5 MCG/INH AEPB Inhale 1 puff into the lungs daily. 30 each 0   No facility-administered medications prior to visit.    ROS   Objective:  There were no vitals filed for this visit.   on *** LPM *** RA BMI Readings from Last 3 Encounters:  07/12/19 22.28 kg/m  07/10/19 21.48 kg/m  07/08/19 21.48 kg/m   Wt Readings from Last 3 Encounters:  07/12/19 114 lb 1.6 oz (51.8 kg)  07/10/19 110 lb (49.9 kg)  07/08/19 110 lb (49.9 kg)    Physical Exam   CBC    Component Value Date/Time   WBC 6.9 07/10/2019 2058   RBC 4.01 07/10/2019 2058   HGB 11.8 (L) 07/10/2019 2058   HGB 11.6 (L) 06/28/2019 1424   HCT 35.3 (L) 07/10/2019 2058   PLT 113 (L) 07/10/2019 2058   PLT 165 06/28/2019 1424   MCV 88.0 07/10/2019 2058   MCH 29.4 07/10/2019 2058   MCHC 33.4 07/10/2019 2058  RDW 13.7 07/10/2019 2058   LYMPHSABS 0.6 (L) 07/10/2019 2058   MONOABS 0.7 07/10/2019 2058   EOSABS 0.2 07/10/2019 2058   BASOSABS 0.0 07/10/2019 2058    CHEMISTRY No results for input(s): NA, K, CL, CO2, GLUCOSE, BUN,  CREATININE, CALCIUM, MG, PHOS in the last 168 hours. Estimated Creatinine Clearance: 30.4 mL/min (A) (by C-G formula based on SCr of 1.13 mg/dL (H)). ***  Chest Imaging- films reviewed: ***  Pulmonary Functions Testing Results: No flowsheet data found.  Pathology: ***  Echocardiogram: ***  Heart Catheterization: ***    Assessment & Plan:   No diagnosis found.    Current Outpatient Medications:  .  albuterol (PROVENTIL HFA;VENTOLIN HFA) 108 (90 BASE) MCG/ACT inhaler, Inhale 2 puffs into the lungs every 6 (six) hours as needed for wheezing., Disp: 1 Inhaler, Rfl: 2 .  albuterol (PROVENTIL) (2.5 MG/3ML) 0.083% nebulizer solution, Take 3 mLs (2.5 mg total) by nebulization every 2 (two) hours as needed for wheezing., Disp: 75 mL, Rfl: 12 .  apixaban (ELIQUIS) 5 MG TABS tablet, Take 1 tablet (5 mg total) by mouth 2 (two) times daily. (Patient taking differently: Take 5 mg by mouth daily. ), Disp: 180 tablet, Rfl: 3 .  Aspirin-Salicylamide-Caffeine (BC HEADACHE PO), Take 1 packet by mouth 2 (two) times daily as needed (headaches). , Disp: , Rfl:  .  atorvastatin (LIPITOR) 10 MG tablet, Take 1 tablet (10 mg total) by mouth at bedtime. (Patient taking differently: Take 10 mg by mouth every evening. ), Disp: 30 tablet, Rfl: 0 .  FLUoxetine (PROZAC) 40 MG capsule, Take 40 mg by mouth every evening. , Disp: , Rfl:  .  lidocaine-prilocaine (EMLA) cream, Apply to affected area once, Disp: 30 g, Rfl: 3 .  LORazepam (ATIVAN) 0.5 MG tablet, Take 0.5 mg by mouth at bedtime. , Disp: , Rfl:  .  losartan (COZAAR) 25 MG tablet, Take 1 tablet (25 mg total) by mouth daily. (Patient taking differently: Take 25 mg by mouth every evening. ), Disp: 90 tablet, Rfl: 3 .  metoprolol succinate (TOPROL-XL) 25 MG 24 hr tablet, Take 2 tablets (50 mg total) by mouth daily. Take with or immediately following a meal. (Patient taking differently: Take 50 mg by mouth every evening. Take with or immediately following a  meal.), Disp: 90 tablet, Rfl: 3 .  naproxen sodium (ALEVE) 220 MG tablet, Take 220 mg by mouth daily as needed (headache)., Disp: , Rfl:  .  nitroGLYCERIN (NITROSTAT) 0.4 MG SL tablet, Place 1 tablet (0.4 mg total) under the tongue every 5 (five) minutes as needed for chest pain. (Patient not taking: Reported on 07/12/2019), Disp: 90 tablet, Rfl: 3 .  Nutritional Supplements (ENSURE ENLIVE PO), Take 237 mLs by mouth daily as needed (when want too). , Disp: , Rfl:  .  omeprazole (PRILOSEC) 40 MG capsule, Take 40 mg by mouth every evening. , Disp: , Rfl:  .  ondansetron (ZOFRAN ODT) 4 MG disintegrating tablet, Take 1 tablet (4 mg total) by mouth every 6 (six) hours as needed for nausea or vomiting., Disp: 30 tablet, Rfl: 0 .  ondansetron (ZOFRAN) 8 MG tablet, Take 1 tablet (8 mg total) by mouth 2 (two) times daily as needed (Nausea or vomiting)., Disp: 30 tablet, Rfl: 1 .  oxyCODONE 10 MG TABS, Take 1 tablet (10 mg total) by mouth every 6 (six) hours as needed for severe pain., Disp: 20 tablet, Rfl: 0 .  potassium chloride (KLOR-CON) 10 MEQ tablet, Take 10 mEq by mouth  every evening. , Disp: , Rfl:  .  prochlorperazine (COMPAZINE) 10 MG tablet, Take 1 tablet (10 mg total) by mouth every 6 (six) hours as needed (Nausea or vomiting)., Disp: 30 tablet, Rfl: 1 .  umeclidinium bromide (INCRUSE ELLIPTA) 62.5 MCG/INH AEPB, Inhale 1 puff into the lungs daily., Disp: 30 each, Rfl: 0   I spent *** minutes on this encounter, including face to face time and non-face to face time spent reviewing records, charting, coordinating care, etc.   Julian Hy, DO [facility] 07/21/2019 1:31 PM

## 2019-07-22 ENCOUNTER — Other Ambulatory Visit: Payer: Self-pay

## 2019-07-22 ENCOUNTER — Telehealth: Payer: Self-pay

## 2019-07-22 DIAGNOSIS — C251 Malignant neoplasm of body of pancreas: Secondary | ICD-10-CM

## 2019-07-22 DIAGNOSIS — Z95828 Presence of other vascular implants and grafts: Secondary | ICD-10-CM

## 2019-07-22 DIAGNOSIS — Z7189 Other specified counseling: Secondary | ICD-10-CM

## 2019-07-22 MED ORDER — LIDOCAINE-PRILOCAINE 2.5-2.5 % EX CREA: TOPICAL_CREAM | CUTANEOUS | 3 refills | Status: DC

## 2019-07-22 NOTE — Telephone Encounter (Signed)
Received fax from [facility] requesting dosing frequency for emla cream.  NE rx sent with this information.

## 2019-07-23 ENCOUNTER — Inpatient Hospital Stay: Payer: Medicare HMO

## 2019-07-23 ENCOUNTER — Telehealth: Payer: Self-pay

## 2019-07-23 ENCOUNTER — Inpatient Hospital Stay (HOSPITAL_BASED_OUTPATIENT_CLINIC_OR_DEPARTMENT_OTHER): Payer: Medicare HMO | Admitting: Hematology

## 2019-07-23 ENCOUNTER — Ambulatory Visit (HOSPITAL_BASED_OUTPATIENT_CLINIC_OR_DEPARTMENT_OTHER): Payer: Medicare HMO | Admitting: Medical

## 2019-07-23 ENCOUNTER — Encounter: Payer: Self-pay | Admitting: Hematology

## 2019-07-23 ENCOUNTER — Other Ambulatory Visit: Payer: Self-pay

## 2019-07-23 ENCOUNTER — Inpatient Hospital Stay: Payer: Medicare HMO | Attending: Nurse Practitioner

## 2019-07-23 ENCOUNTER — Other Ambulatory Visit: Payer: Self-pay | Admitting: Radiology

## 2019-07-23 VITALS — BP 174/75 | HR 72 | Temp 98.1°F | Resp 18 | Ht 60.0 in | Wt 110.8 lb

## 2019-07-23 DIAGNOSIS — M1990 Unspecified osteoarthritis, unspecified site: Secondary | ICD-10-CM | POA: Insufficient documentation

## 2019-07-23 DIAGNOSIS — I700 Atherosclerosis of aorta: Secondary | ICD-10-CM | POA: Diagnosis not present

## 2019-07-23 DIAGNOSIS — K589 Irritable bowel syndrome without diarrhea: Secondary | ICD-10-CM | POA: Insufficient documentation

## 2019-07-23 DIAGNOSIS — K219 Gastro-esophageal reflux disease without esophagitis: Secondary | ICD-10-CM | POA: Diagnosis not present

## 2019-07-23 DIAGNOSIS — Z79899 Other long term (current) drug therapy: Secondary | ICD-10-CM | POA: Diagnosis not present

## 2019-07-23 DIAGNOSIS — Z7189 Other specified counseling: Secondary | ICD-10-CM

## 2019-07-23 DIAGNOSIS — Z7982 Long term (current) use of aspirin: Secondary | ICD-10-CM | POA: Diagnosis not present

## 2019-07-23 DIAGNOSIS — C251 Malignant neoplasm of body of pancreas: Secondary | ICD-10-CM | POA: Diagnosis present

## 2019-07-23 DIAGNOSIS — G893 Neoplasm related pain (acute) (chronic): Secondary | ICD-10-CM | POA: Insufficient documentation

## 2019-07-23 DIAGNOSIS — Z5111 Encounter for antineoplastic chemotherapy: Secondary | ICD-10-CM | POA: Diagnosis present

## 2019-07-23 DIAGNOSIS — G629 Polyneuropathy, unspecified: Secondary | ICD-10-CM | POA: Diagnosis not present

## 2019-07-23 DIAGNOSIS — I10 Essential (primary) hypertension: Secondary | ICD-10-CM | POA: Diagnosis not present

## 2019-07-23 DIAGNOSIS — I429 Cardiomyopathy, unspecified: Secondary | ICD-10-CM | POA: Diagnosis not present

## 2019-07-23 DIAGNOSIS — J42 Unspecified chronic bronchitis: Secondary | ICD-10-CM | POA: Diagnosis not present

## 2019-07-23 DIAGNOSIS — I4891 Unspecified atrial fibrillation: Secondary | ICD-10-CM | POA: Diagnosis not present

## 2019-07-23 DIAGNOSIS — E785 Hyperlipidemia, unspecified: Secondary | ICD-10-CM | POA: Insufficient documentation

## 2019-07-23 DIAGNOSIS — K8689 Other specified diseases of pancreas: Secondary | ICD-10-CM

## 2019-07-23 DIAGNOSIS — Z7901 Long term (current) use of anticoagulants: Secondary | ICD-10-CM | POA: Insufficient documentation

## 2019-07-23 DIAGNOSIS — I2510 Atherosclerotic heart disease of native coronary artery without angina pectoris: Secondary | ICD-10-CM | POA: Diagnosis not present

## 2019-07-23 LAB — CBC WITH DIFFERENTIAL (CANCER CENTER ONLY)
Abs Immature Granulocytes: 0.04 10*3/uL (ref 0.00–0.07)
Basophils Absolute: 0.1 10*3/uL (ref 0.0–0.1)
Basophils Relative: 1 %
Eosinophils Absolute: 0.2 10*3/uL (ref 0.0–0.5)
Eosinophils Relative: 4 %
HCT: 35.6 % — ABNORMAL LOW (ref 36.0–46.0)
Hemoglobin: 11.4 g/dL — ABNORMAL LOW (ref 12.0–15.0)
Immature Granulocytes: 1 %
Lymphocytes Relative: 18 %
Lymphs Abs: 0.9 10*3/uL (ref 0.7–4.0)
MCH: 28.7 pg (ref 26.0–34.0)
MCHC: 32 g/dL (ref 30.0–36.0)
MCV: 89.7 fL (ref 80.0–100.0)
Monocytes Absolute: 0.5 10*3/uL (ref 0.1–1.0)
Monocytes Relative: 10 %
Neutro Abs: 3 10*3/uL (ref 1.7–7.7)
Neutrophils Relative %: 66 %
Platelet Count: 236 10*3/uL (ref 150–400)
RBC: 3.97 MIL/uL (ref 3.87–5.11)
RDW: 13.7 % (ref 11.5–15.5)
WBC Count: 4.6 10*3/uL (ref 4.0–10.5)
nRBC: 0 % (ref 0.0–0.2)

## 2019-07-23 LAB — CMP (CANCER CENTER ONLY)
ALT: <6 U/L (ref 0–44)
AST: 16 U/L (ref 15–41)
Albumin: 3.5 g/dL (ref 3.5–5.0)
Alkaline Phosphatase: 137 U/L — ABNORMAL HIGH (ref 38–126)
Anion gap: 12 (ref 5–15)
BUN: 11 mg/dL (ref 8–23)
CO2: 22 mmol/L (ref 22–32)
Calcium: 9 mg/dL (ref 8.9–10.3)
Chloride: 101 mmol/L (ref 98–111)
Creatinine: 1.21 mg/dL — ABNORMAL HIGH (ref 0.44–1.00)
GFR, Est AFR Am: 50 mL/min — ABNORMAL LOW (ref >60)
GFR, Estimated: 43 mL/min — ABNORMAL LOW (ref >60)
Glucose, Bld: 86 mg/dL (ref 70–99)
Potassium: 4.5 mmol/L (ref 3.5–5.1)
Sodium: 135 mmol/L (ref 135–145)
Total Bilirubin: 0.4 mg/dL (ref 0.3–1.2)
Total Protein: 7 g/dL (ref 6.5–8.1)

## 2019-07-23 MED ORDER — DIPHENHYDRAMINE HCL 50 MG/ML IJ SOLN
25.0000 mg | Freq: Once | INTRAMUSCULAR | Status: AC
  Administered 2019-07-23: 25 mg via INTRAVENOUS

## 2019-07-23 MED ORDER — SODIUM CHLORIDE 0.9 % IV SOLN
Freq: Once | INTRAVENOUS | Status: AC
  Filled 2019-07-23: qty 250

## 2019-07-23 MED ORDER — PROCHLORPERAZINE MALEATE 10 MG PO TABS
10.0000 mg | ORAL_TABLET | Freq: Once | ORAL | Status: AC
  Administered 2019-07-23: 10 mg via ORAL

## 2019-07-23 MED ORDER — PROCHLORPERAZINE MALEATE 10 MG PO TABS
ORAL_TABLET | ORAL | Status: AC
  Filled 2019-07-23: qty 1

## 2019-07-23 MED ORDER — DIPHENHYDRAMINE HCL 50 MG/ML IJ SOLN
INTRAMUSCULAR | Status: AC
  Filled 2019-07-23: qty 1

## 2019-07-23 MED ORDER — SODIUM CHLORIDE 0.9 % IV SOLN
1000.0000 mg/m2 | Freq: Once | INTRAVENOUS | Status: AC
  Administered 2019-07-23: 1482 mg via INTRAVENOUS
  Filled 2019-07-23: qty 38.98

## 2019-07-23 NOTE — Progress Notes (Signed)
Met with patient at registration to introduce myself as Financial Resource Specialist and to offer available resources.  Discussed one-time $1000 [employer] grant and qualifications to assist with personal expenses while going through treatment.  Gave her my card if interested in applying and for any additional financial questions or concerns.  

## 2019-07-23 NOTE — Patient Instructions (Signed)
[facility] Discharge Instructions for Patients Receiving Chemotherapy  Today you received the following chemotherapy agents Gemcitabine/Gemzar  To help prevent nausea and vomiting after your treatment, we encourage you to take your nausea medication as directed.   If you develop nausea and vomiting that is not controlled by your nausea medication, call the clinic.   BELOW ARE SYMPTOMS THAT SHOULD BE REPORTED IMMEDIATELY:  *FEVER GREATER THAN 100.5 F  *CHILLS WITH OR WITHOUT FEVER  NAUSEA AND VOMITING THAT IS NOT CONTROLLED WITH YOUR NAUSEA MEDICATION  *UNUSUAL SHORTNESS OF BREATH  *UNUSUAL BRUISING OR BLEEDING  TENDERNESS IN MOUTH AND THROAT WITH OR WITHOUT PRESENCE OF ULCERS  *URINARY PROBLEMS  *BOWEL PROBLEMS  UNUSUAL RASH Items with * indicate a potential emergency and should be followed up as soon as possible.  Feel free to call the clinic should you have any questions or concerns. The clinic phone number is [phone].  Please show the [facility] at check-in to the Emergency Department and triage nurse.  Gemcitabine injection What is this medicine? GEMCITABINE (jem SYE ta been) is a chemotherapy drug. This medicine is used to treat many types of cancer like breast cancer, lung cancer, pancreatic cancer, and ovarian cancer. This medicine may be used for other purposes; ask your health care provider or pharmacist if you have questions. COMMON BRAND NAME(S): Gemzar, Infugem What should I tell my health care provider before I take this medicine? They need to know if you have any of these conditions:  blood disorders  infection  kidney disease  liver disease  lung or breathing disease, like asthma  recent or ongoing radiation therapy  an unusual or allergic reaction to gemcitabine, other chemotherapy, other medicines, foods, dyes, or preservatives  pregnant or trying to get pregnant  breast-feeding How should I use this  medicine? This drug is given as an infusion into a vein. It is administered in a hospital or clinic by a specially trained health care professional. Talk to your pediatrician regarding the use of this medicine in children. Special care may be needed. Overdosage: If you think you have taken too much of this medicine contact a poison control center or emergency room at once. NOTE: This medicine is only for you. Do not share this medicine with others. What if I miss a dose? It is important not to miss your dose. Call your doctor or health care professional if you are unable to keep an appointment. What may interact with this medicine?  medicines to increase blood counts like filgrastim, pegfilgrastim, sargramostim  some other chemotherapy drugs like cisplatin  vaccines Talk to your doctor or health care professional before taking any of these medicines:  acetaminophen  aspirin  ibuprofen  ketoprofen  naproxen This list may not describe all possible interactions. Give your health care provider a list of all the medicines, herbs, non-prescription drugs, or dietary supplements you use. Also tell them if you smoke, drink alcohol, or use illegal drugs. Some items may interact with your medicine. What should I watch for while using this medicine? Visit your doctor for checks on your progress. This drug may make you feel generally unwell. This is not uncommon, as chemotherapy can affect healthy cells as well as cancer cells. Report any side effects. Continue your course of treatment even though you feel ill unless your doctor tells you to stop. In some cases, you may be given additional medicines to help with side effects. Follow all directions for their use. Call  your doctor or health care professional for advice if you get a fever, chills or sore throat, or other symptoms of a cold or flu. Do not treat yourself. This drug decreases your body's ability to fight infections. Try to avoid being  around people who are sick. This medicine may increase your risk to bruise or bleed. Call your doctor or health care professional if you notice any unusual bleeding. Be careful brushing and flossing your teeth or using a toothpick because you may get an infection or bleed more easily. If you have any dental work done, tell your dentist you are receiving this medicine. Avoid taking products that contain aspirin, acetaminophen, ibuprofen, naproxen, or ketoprofen unless instructed by your doctor. These medicines may hide a fever. Do not become pregnant while taking this medicine or for 6 months after stopping it. Women should inform their doctor if they wish to become pregnant or think they might be pregnant. Men should not father a child while taking this medicine and for 3 months after stopping it. There is a potential for serious side effects to an unborn child. Talk to your health care professional or pharmacist for more information. Do not breast-feed an infant while taking this medicine or for at least 1 week after stopping it. Men should inform their doctors if they wish to father a child. This medicine may lower sperm counts. Talk with your doctor or health care professional if you are concerned about your fertility. What side effects may I notice from receiving this medicine? Side effects that you should report to your doctor or health care professional as soon as possible:  allergic reactions like skin rash, itching or hives, swelling of the face, lips, or tongue  breathing problems  pain, redness, or irritation at site where injected  signs and symptoms of a dangerous change in heartbeat or heart rhythm like chest pain; dizziness; fast or irregular heartbeat; palpitations; feeling faint or lightheaded, falls; breathing problems  signs of decreased platelets or bleeding - bruising, pinpoint red spots on the skin, black, tarry stools, blood in the urine  signs of decreased red blood cells -  unusually weak or tired, feeling faint or lightheaded, falls  signs of infection - fever or chills, cough, sore throat, pain or difficulty passing urine  signs and symptoms of kidney injury like trouble passing urine or change in the amount of urine  signs and symptoms of liver injury like dark yellow or brown urine; general ill feeling or flu-like symptoms; light-colored stools; loss of appetite; nausea; right upper belly pain; unusually weak or tired; yellowing of the eyes or skin  swelling of ankles, feet, hands Side effects that usually do not require medical attention (report to your doctor or health care professional if they continue or are bothersome):  constipation  diarrhea  hair loss  loss of appetite  nausea  rash  vomiting This list may not describe all possible side effects. Call your doctor for medical advice about side effects. You may report side effects to FDA at 1-800-FDA-1088. Where should I keep my medicine? This drug is given in a hospital or clinic and will not be stored at home. NOTE: This sheet is a summary. It may not cover all possible information. If you have questions about this medicine, talk to your doctor, pharmacist, or health care provider.  2020 Elsevier/Gold Standard (2017-03-26 18:06:11)

## 2019-07-23 NOTE — Telephone Encounter (Signed)
Patient's daughter [name] called stating that her sister [name] who is bringing her to her appointment is running behind. Cannot be here at 12:15 for labs.  Also did not get port this past week because she was sick.  It has been rescheduled for next week.  She will see Dr. Burr Medico at 12:40, labs after seeing her prior to her infusion.

## 2019-07-23 NOTE — Progress Notes (Signed)
About 15 minutes after PO Compazine was given, pt began to c/o itching to legs, ankles. Sandi Mealy, PA to bedside, Benadryl given (see eMAR). Ok to proceed with Gemzar, per Sandi Mealy, PA

## 2019-07-24 NOTE — Progress Notes (Signed)
MD Feng's office notified of pt's reaction to compazine. During discharge teaching, pt verbalized understanding, per Tollie Pizza, RN to not take her prescribed Compazine at home for nausea. Pt verbalizes that she will only take her home supply of Zofran and will call [place] with any further questions/concerns.

## 2019-07-25 LAB — CANCER ANTIGEN 19-9: CA 19-9: 794 U/mL — ABNORMAL HIGH (ref 0–35)

## 2019-07-26 ENCOUNTER — Telehealth: Payer: Self-pay | Admitting: Hematology

## 2019-07-26 NOTE — Progress Notes (Signed)
The patient was seen in infusion today.  She had received Compazine 10 mg IV as a premed and developed itching of her bilateral upper extremities.  She was then given Benadryl 25 mg IV with resolution of her symptoms.  She was able to proceed onward with her treatment today without any further issues of concern.  Sandi Mealy, MHS, PA-C Physician Assistant

## 2019-07-26 NOTE — Telephone Encounter (Signed)
No 7/9 los

## 2019-07-27 ENCOUNTER — Ambulatory Visit (HOSPITAL_COMMUNITY)
Admission: RE | Admit: 2019-07-27 | Discharge: 2019-07-27 | Disposition: A | Discharge status: Home / Self Care | Payer: Medicare HMO | Source: Ambulatory Visit | Attending: Nurse Practitioner | Admitting: Nurse Practitioner

## 2019-07-27 ENCOUNTER — Other Ambulatory Visit: Payer: Self-pay | Admitting: Nurse Practitioner

## 2019-07-27 ENCOUNTER — Encounter (HOSPITAL_COMMUNITY): Payer: Self-pay

## 2019-07-27 ENCOUNTER — Other Ambulatory Visit: Payer: Self-pay

## 2019-07-27 ENCOUNTER — Ambulatory Visit (HOSPITAL_COMMUNITY)
Admission: RE | Admit: 2019-07-27 | Discharge: 2019-07-27 | Disposition: A | Discharge status: Home / Self Care | Payer: Medicare HMO | Source: Ambulatory Visit | Attending: Hematology | Admitting: Hematology

## 2019-07-27 DIAGNOSIS — C251 Malignant neoplasm of body of pancreas: Secondary | ICD-10-CM | POA: Diagnosis not present

## 2019-07-27 DIAGNOSIS — K219 Gastro-esophageal reflux disease without esophagitis: Secondary | ICD-10-CM | POA: Diagnosis not present

## 2019-07-27 DIAGNOSIS — Z8249 Family history of ischemic heart disease and other diseases of the circulatory system: Secondary | ICD-10-CM | POA: Diagnosis not present

## 2019-07-27 DIAGNOSIS — Z7901 Long term (current) use of anticoagulants: Secondary | ICD-10-CM | POA: Diagnosis not present

## 2019-07-27 DIAGNOSIS — Z803 Family history of malignant neoplasm of breast: Secondary | ICD-10-CM | POA: Diagnosis not present

## 2019-07-27 DIAGNOSIS — N179 Acute kidney failure, unspecified: Secondary | ICD-10-CM | POA: Diagnosis not present

## 2019-07-27 DIAGNOSIS — Z809 Family history of malignant neoplasm, unspecified: Secondary | ICD-10-CM | POA: Insufficient documentation

## 2019-07-27 DIAGNOSIS — I2510 Atherosclerotic heart disease of native coronary artery without angina pectoris: Secondary | ICD-10-CM | POA: Insufficient documentation

## 2019-07-27 DIAGNOSIS — J449 Chronic obstructive pulmonary disease, unspecified: Secondary | ICD-10-CM | POA: Insufficient documentation

## 2019-07-27 DIAGNOSIS — Z888 Allergy status to other drugs, medicaments and biological substances status: Secondary | ICD-10-CM | POA: Insufficient documentation

## 2019-07-27 DIAGNOSIS — I10 Essential (primary) hypertension: Secondary | ICD-10-CM | POA: Insufficient documentation

## 2019-07-27 DIAGNOSIS — E785 Hyperlipidemia, unspecified: Secondary | ICD-10-CM | POA: Diagnosis not present

## 2019-07-27 DIAGNOSIS — I4891 Unspecified atrial fibrillation: Secondary | ICD-10-CM | POA: Insufficient documentation

## 2019-07-27 DIAGNOSIS — I429 Cardiomyopathy, unspecified: Secondary | ICD-10-CM | POA: Diagnosis not present

## 2019-07-27 DIAGNOSIS — K589 Irritable bowel syndrome without diarrhea: Secondary | ICD-10-CM | POA: Diagnosis not present

## 2019-07-27 DIAGNOSIS — Z79899 Other long term (current) drug therapy: Secondary | ICD-10-CM | POA: Diagnosis not present

## 2019-07-27 DIAGNOSIS — Z801 Family history of malignant neoplasm of trachea, bronchus and lung: Secondary | ICD-10-CM | POA: Insufficient documentation

## 2019-07-27 DIAGNOSIS — F17210 Nicotine dependence, cigarettes, uncomplicated: Secondary | ICD-10-CM | POA: Diagnosis not present

## 2019-07-27 DIAGNOSIS — E7800 Pure hypercholesterolemia, unspecified: Secondary | ICD-10-CM | POA: Diagnosis not present

## 2019-07-27 DIAGNOSIS — M1990 Unspecified osteoarthritis, unspecified site: Secondary | ICD-10-CM | POA: Diagnosis not present

## 2019-07-27 DIAGNOSIS — Z7982 Long term (current) use of aspirin: Secondary | ICD-10-CM | POA: Diagnosis not present

## 2019-07-27 HISTORY — PX: IR IMAGING GUIDED PORT INSERTION: IMG5740

## 2019-07-27 LAB — PROTIME-INR
INR: 1 (ref 0.8–1.2)
Prothrombin Time: 12.7 seconds (ref 11.4–15.2)

## 2019-07-27 LAB — CBC
HCT: 31.1 % — ABNORMAL LOW (ref 36.0–46.0)
Hemoglobin: 10 g/dL — ABNORMAL LOW (ref 12.0–15.0)
MCH: 29.4 pg (ref 26.0–34.0)
MCHC: 32.2 g/dL (ref 30.0–36.0)
MCV: 91.5 fL (ref 80.0–100.0)
Platelets: 175 10*3/uL (ref 150–400)
RBC: 3.4 MIL/uL — ABNORMAL LOW (ref 3.87–5.11)
RDW: 13.7 % (ref 11.5–15.5)
WBC: 3.5 10*3/uL — ABNORMAL LOW (ref 4.0–10.5)
nRBC: 0 % (ref 0.0–0.2)

## 2019-07-27 MED ORDER — MIDAZOLAM HCL 2 MG/2ML IJ SOLN
INTRAMUSCULAR | Status: AC
  Filled 2019-07-27: qty 2

## 2019-07-27 MED ORDER — FENTANYL CITRATE (PF) 100 MCG/2ML IJ SOLN
INTRAMUSCULAR | Status: AC | PRN
  Administered 2019-07-27: 50 ug via INTRAVENOUS

## 2019-07-27 MED ORDER — CEFAZOLIN SODIUM-DEXTROSE 2-4 GM/100ML-% IV SOLN: 2.0000 g | Freq: Once | INTRAVENOUS | Status: AC

## 2019-07-27 MED ORDER — MIDAZOLAM HCL 2 MG/2ML IJ SOLN
INTRAMUSCULAR | Status: AC | PRN
  Administered 2019-07-27: 1 mg via INTRAVENOUS

## 2019-07-27 MED ORDER — CEFAZOLIN SODIUM-DEXTROSE 2-4 GM/100ML-% IV SOLN
INTRAVENOUS | Status: AC
  Administered 2019-07-27: 2 g via INTRAVENOUS
  Filled 2019-07-27: qty 100

## 2019-07-27 MED ORDER — LIDOCAINE-EPINEPHRINE 1 %-1:100000 IJ SOLN
INTRAMUSCULAR | Status: AC
  Filled 2019-07-27: qty 1

## 2019-07-27 MED ORDER — FENTANYL CITRATE (PF) 100 MCG/2ML IJ SOLN
INTRAMUSCULAR | Status: AC
  Filled 2019-07-27: qty 2

## 2019-07-27 MED ORDER — SODIUM CHLORIDE 0.9 % IV SOLN: INTRAVENOUS | Status: DC

## 2019-07-27 MED ORDER — HEPARIN SOD (PORK) LOCK FLUSH 100 UNIT/ML IV SOLN
INTRAVENOUS | Status: AC
  Filled 2019-07-27: qty 5

## 2019-07-27 NOTE — Discharge Instructions (Signed)
Moderate Conscious Sedation, Adult, Care After These instructions provide you with information about caring for yourself after your procedure. Your health care provider may also give you more specific instructions. Your treatment has been planned according to current medical practices, but problems sometimes occur. Call your health care provider if you have any problems or questions after your procedure. What can I expect after the procedure? After your procedure, it is common:  To feel sleepy for several hours.  To feel clumsy and have poor balance for several hours.  To have poor judgment for several hours.  To vomit if you eat too soon. Follow these instructions at home: For at least 24 hours after the procedure:   Do not: ? Participate in activities where you could fall or become injured. ? Drive. ? Use heavy machinery. ? Drink alcohol. ? Take sleeping pills or medicines that cause drowsiness. ? Make important decisions or sign legal documents. ? Take care of children on your own.  Rest. Eating and drinking  Follow the diet recommended by your health care provider.  If you vomit: ? Drink water, juice, or soup when you can drink without vomiting. ? Make sure you have little or no nausea before eating solid foods. General instructions  Have a responsible adult stay with you until you are awake and alert.  Take over-the-counter and prescription medicines only as told by your health care provider.  If you smoke, do not smoke without supervision.  Keep all follow-up visits as told by your health care provider. This is important. Contact a health care provider if:  You keep feeling nauseous or you keep vomiting.  You feel light-headed.  You develop a rash.  You have a fever. Get help right away if:  You have trouble breathing. This information is not intended to replace advice given to you by your health care provider. Make sure you discuss any questions you have  with your health care provider. Document Revised: 12/13/2016 Document Reviewed: 04/22/2015 Elsevier Patient Education  2020 Elsevier Inc. Implanted Port Home Guide An implanted port is a device that is placed under the skin. It is usually placed in the chest. The device can be used to give IV medicine, to take blood, or for dialysis. You may have an implanted port if:  You need IV medicine that would be irritating to the small veins in your hands or arms.  You need IV medicines, such as antibiotics, for a long period of time.  You need IV nutrition for a long period of time.  You need dialysis. Having a port means that your health care provider will not need to use the veins in your arms for these procedures. You may have fewer limitations when using a port than you would if you used other types of long-term IVs, and you will likely be able to return to normal activities after your incision heals. An implanted port has two main parts:  Reservoir. The reservoir is the part where a needle is inserted to give medicines or draw blood. The reservoir is round. After it is placed, it appears as a small, raised area under your skin.  Catheter. The catheter is a thin, flexible tube that connects the reservoir to a vein. Medicine that is inserted into the reservoir goes into the catheter and then into the vein. How is my port accessed? To access your port:  A numbing cream may be placed on the skin over the port site.  Your health care provider   will put on a mask and sterile gloves.  The skin over your port will be cleaned carefully with a germ-killing soap and allowed to dry.  Your health care provider will gently pinch the port and insert a needle into it.  Your health care provider will check for a blood return to make sure the port is in the vein and is not clogged.  If your port needs to remain accessed to get medicine continuously (constant infusion), your health care provider will place  a clear bandage (dressing) over the needle site. The dressing and needle will need to be changed every week, or as told by your health care provider. What is flushing? Flushing helps keep the port from getting clogged. Follow instructions from your health care provider about how and when to flush the port. Ports are usually flushed with saline solution or a medicine called heparin. The need for flushing will depend on how the port is used:  If the port is only used from time to time to give medicines or draw blood, the port may need to be flushed: ? Before and after medicines have been given. ? Before and after blood has been drawn. ? As part of routine maintenance. Flushing may be recommended every 4-6 weeks.  If a constant infusion is running, the port may not need to be flushed.  Throw away any syringes in a disposal container that is meant for sharp items (sharps container). You can buy a sharps container from a pharmacy, or you can make one by using an empty hard plastic bottle with a cover. How long will my port stay implanted? The port can stay in for as long as your health care provider thinks it is needed. When it is time for the port to come out, a surgery will be done to remove it. The surgery will be similar to the procedure that was done to put the port in. Follow these instructions at home:   Flush your port as told by your health care provider.  If you need an infusion over several days, follow instructions from your health care provider about how to take care of your port site. Make sure you: ? Wash your hands with soap and water before you change your dressing. If soap and water are not available, use alcohol-based hand sanitizer. ? Change your dressing as told by your health care provider. ? Place any used dressings or infusion bags into a plastic bag. Throw that bag in the trash. ? Keep the dressing that covers the needle clean and dry. Do not get it wet. ? Do not use  scissors or sharp objects near the tube. ? Keep the tube clamped, unless it is being used.  Check your port site every day for signs of infection. Check for: ? Redness, swelling, or pain. ? Fluid or blood. ? Pus or a bad smell.  Protect the skin around the port site. ? Avoid wearing bra straps that rub or irritate the site. ? Protect the skin around your port from seat belts. Place a soft pad over your chest if needed.  Bathe or shower as told by your health care provider. The site may get wet as long as you are not actively receiving an infusion.  Return to your normal activities as told by your health care provider. Ask your health care provider what activities are safe for you.  Carry a medical alert card or wear a medical alert bracelet at all times. This will   let health care providers know that you have an implanted port in case of an emergency. Get help right away if:  You have redness, swelling, or pain at the port site.  You have fluid or blood coming from your port site.  You have pus or a bad smell coming from the port site.  You have a fever. Summary  Implanted ports are usually placed in the chest for long-term IV access.  Follow instructions from your health care provider about flushing the port and changing bandages (dressings).  Take care of the area around your port by avoiding clothing that puts pressure on the area, and by watching for signs of infection.  Protect the skin around your port from seat belts. Place a soft pad over your chest if needed.  Get help right away if you have a fever or you have redness, swelling, pain, drainage, or a bad smell at the port site. This information is not intended to replace advice given to you by your health care provider. Make sure you discuss any questions you have with your health care provider. Document Revised: 04/24/2018 Document Reviewed: 02/03/2016 Elsevier Patient Education  2020 Elsevier Inc.  

## 2019-07-27 NOTE — Procedures (Signed)
Pre Procedure Dx: Poor venous access Post Procedural Dx: Same  Successful placement of right IJ approach port-a-cath with tip at the superior caval atrial junction. The catheter is ready for immediate use.  Estimated Blood Loss: Minimal  Complications: None immediate.  Jay Thelonious Kauffmann, MD Pager #: [phone]   

## 2019-07-27 NOTE — H&P (Signed)
Chief Complaint: Patient was seen in consultation today for port placement at the request of Burton,[name] K  Referring Physician(s): Burton,[name] K  Supervising Physician: Sandi Mariscal  Patient Status: [facility] - Out-pt  History of Present Illness: Allison Vaughan is a 74 y.o. female diagnosed with pancreatic cancer. She has started chemotherapy and is now referred for port placement. PMHx, meds, labs, imaging, allergies reviewed. Eliquis held. Last doe 91/10 Feels well, no recent fevers, chills, illness. Has been NPO today as directed. Family at bedside.   Past Medical History:  Diagnosis Date  . Accidental drug overdose   . Acute encephalopathy   . Acute on chronic respiratory failure with hypoxia ([place]) 03/01/2017  . Acute renal failure ([place]) 04/16/2014  . AKI (acute kidney injury) ([place]) 04/30/2014  . Altered mental status 06/27/2012  . Anxiety   . Atrial fibrillation ([place])   . CAD (coronary artery disease)   . CAP (community acquired pneumonia) 04/18/2014  . Cardiomyopathy ([place])   . Chronic hypoxemic respiratory failure ([place]) 06/26/2012  . Chronic pain   . COPD exacerbation ([place]) 06/26/2012  . COPD with acute exacerbation ([place]) 03/01/2017  . DEPRESSIVE DISORDER, NOS 03/13/2006   Qualifier: Diagnosis of  By: Damita Dunnings MD, Phillip Heal    . Edema, peripheral   . Essential hypertension 04/30/2014  . Fall at home, initial encounter 03/01/2017  . GASTROESOPHAGEAL REFLUX, NO ESOPHAGITIS 03/13/2006   Qualifier: Diagnosis of  By: Damita Dunnings MD, Phillip Heal    . HYPERCHOLESTEROLEMIA 03/13/2006   Qualifier: Diagnosis of  By: Damita Dunnings MD, Phillip Heal    . Hyperlipidemia   . HYPERTENSION, BENIGN SYSTEMIC 03/13/2006   Qualifier: Diagnosis of  By: Damita Dunnings MD, Phillip Heal    . Hypokalemia 04/30/2014  . Hyponatremia 04/30/2014  . Hypotension 04/30/2014  . Hypoxemia 06/26/2012  . Hypoxia 03/26/2018  . Irritable bowel syndrome 03/13/2006   Qualifier: Diagnosis of  By: Damita Dunnings MD, Phillip Heal    . Lung nodule 04/16/2014  .  MENOPAUSAL SYNDROME 03/13/2006   Qualifier: Diagnosis of  By: Damita Dunnings MD, Phillip Heal    . Mood disorder [facility]) 03/13/2006   Qualifier: Diagnosis of  By: Damita Dunnings MD, Phillip Heal    . 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 Nausea 04/30/2014  . Nausea vomiting and diarrhea 04/16/2014  . OSTEOARTHRITIS, MULTI SITES 03/13/2006   Qualifier: Diagnosis of  By: Damita Dunnings MD, Phillip Heal    . Pulmonary nodule 04/30/2014  . Respiratory failure ([place]) 01/22/2017  . TOBACCO DEPENDENCE 03/13/2006   Qualifier: Diagnosis of  By: Damita Dunnings MD, Phillip Heal    . Weight loss     Past Surgical History:  Procedure Laterality Date  . ABDOMINAL HYSTERECTOMY    . ESOPHAGOGASTRODUODENOSCOPY N/A 07/08/2019   Procedure: ESOPHAGOGASTRODUODENOSCOPY (EGD);  Surgeon: Milus Banister, MD;  Location: [facility];  Service: Endoscopy;  Laterality: N/A;  . ESOPHAGOGASTRODUODENOSCOPY (EGD) WITH PROPOFOL N/A 05/05/2019   Procedure: ESOPHAGOGASTRODUODENOSCOPY (EGD) WITH PROPOFOL;  Surgeon: Arta Silence, MD;  Location: [facility] ENDOSCOPY;  Service: Endoscopy;  Laterality: N/A;  . EUS N/A 07/08/2019   Procedure: UPPER ENDOSCOPIC ULTRASOUND (EUS) RADIAL;  Surgeon: Milus Banister, MD;  Location: [facility] ENDOSCOPY;  Service: Endoscopy;  Laterality: N/A;  . EYE SURGERY    . FIDUCIAL MARKER PLACEMENT N/A 07/08/2019   Procedure: FIDUCIAL MARKER PLACEMENT;  Surgeon: Milus Banister, MD;  Location: [facility] ENDOSCOPY;  Service: Endoscopy;  Laterality: N/A;  . FINE NEEDLE ASPIRATION N/A 05/05/2019   Procedure: FINE NEEDLE ASPIRATION (FNA) LINEAR;  Surgeon: Arta Silence, MD;  Location: [facility];  Service: Endoscopy;  Laterality: N/A;  . FINE NEEDLE ASPIRATION N/A 07/08/2019   Procedure: FINE NEEDLE ASPIRATION (FNA) LINEAR;  Surgeon: Milus Banister, MD;  Location: [facility];  Service: Endoscopy;  Laterality: N/A;  . ROTATOR CUFF REPAIR Right 2012  . UPPER ESOPHAGEAL ENDOSCOPIC ULTRASOUND (EUS) N/A 05/05/2019   Procedure: UPPER ESOPHAGEAL ENDOSCOPIC ULTRASOUND (EUS) with FNA;   Surgeon: Arta Silence, MD;  Location: [facility];  Service: Endoscopy;  Laterality: N/A;    Allergies: Compazine [prochlorperazine edisylate]  Medications: Prior to Admission medications   Medication Sig Start Date End Date Taking? Authorizing Provider  albuterol (PROVENTIL HFA;VENTOLIN HFA) 108 (90 BASE) MCG/ACT inhaler Inhale 2 puffs into the lungs every 6 (six) hours as needed for wheezing. 06/09/12  Yes Oti, Brantley Stage, MD  albuterol (PROVENTIL) (2.5 MG/3ML) 0.083% nebulizer solution Take 3 mLs (2.5 mg total) by nebulization every 2 (two) hours as needed for wheezing. 04/20/14  Yes Rama, Venetia Maxon, MD  Aspirin-Salicylamide-Caffeine (BC HEADACHE PO) Take 1 packet by mouth 2 (two) times daily as needed (headaches).    Yes [provider]  atorvastatin (LIPITOR) 10 MG tablet Take 1 tablet (10 mg total) by mouth at bedtime. Patient taking differently: Take 10 mg by mouth every evening.  03/28/18  Yes Cherene Altes, MD  FLUoxetine (PROZAC) 40 MG capsule Take 40 mg by mouth every evening.    Yes [provider]  losartan (COZAAR) 25 MG tablet Take 1 tablet (25 mg total) by mouth daily. Patient taking differently: Take 25 mg by mouth every evening.  02/01/19 07/27/19 Yes Kroeger, Lorelee Cover., PA-C  metoprolol succinate (TOPROL-XL) 25 MG 24 hr tablet Take 2 tablets (50 mg total) by mouth daily. Take with or immediately following a meal. Patient taking differently: Take 50 mg by mouth every evening. Take with or immediately following a meal. 02/01/19 07/27/19 Yes Kroeger, Lorelee Cover., PA-C  naproxen sodium (ALEVE) 220 MG tablet Take 220 mg by mouth daily as needed (headache).   Yes [provider]  Nutritional Supplements (ENSURE ENLIVE PO) Take 237 mLs by mouth daily as needed (when want too).    Yes [provider]  omeprazole (PRILOSEC) 40 MG capsule Take 40 mg by mouth every evening.    Yes [provider]  ondansetron (ZOFRAN ODT) 4 MG disintegrating  tablet Take 1 tablet (4 mg total) by mouth every 6 (six) hours as needed for nausea or vomiting. 07/11/19  Yes Ward, Cyril Mourning N, DO  ondansetron (ZOFRAN) 8 MG tablet Take 1 tablet (8 mg total) by mouth 2 (two) times daily as needed (Nausea or vomiting). 07/13/19  Yes Truitt Merle, MD  oxyCODONE 10 MG TABS Take 1 tablet (10 mg total) by mouth every 6 (six) hours as needed for severe pain. 07/11/19  Yes Ward, Delice Bison, DO  potassium chloride (KLOR-CON) 10 MEQ tablet Take 10 mEq by mouth every evening.  04/15/19  Yes [provider]  umeclidinium bromide (INCRUSE ELLIPTA) 62.5 MCG/INH AEPB Inhale 1 puff into the lungs daily. 12/26/18  Yes Florencia Reasons, MD  apixaban (ELIQUIS) 5 MG TABS tablet Take 1 tablet (5 mg total) by mouth 2 (two) times daily. Patient taking differently: Take 5 mg by mouth daily.  02/01/19   Kroeger, Lorelee Cover., PA-C  lidocaine-prilocaine (EMLA) cream Apply to affected area as needed weekly 07/22/19   Truitt Merle, MD  LORazepam (ATIVAN) 0.5 MG tablet Take 0.5 mg by mouth at bedtime.     [provider]  nitroGLYCERIN (NITROSTAT) 0.4 MG SL  tablet Place 1 tablet (0.4 mg total) under the tongue every 5 (five) minutes as needed for chest pain. Patient not taking: Reported on 07/12/2019 02/01/19 07/12/19  Abigail Butts., PA-C  prochlorperazine (COMPAZINE) 10 MG tablet Take 1 tablet (10 mg total) by mouth every 6 (six) hours as needed (Nausea or vomiting). 07/13/19   Truitt Merle, MD     Family History  Problem Relation Age of Onset  . CAD Father   . Lung cancer Brother   . Cancer Brother        lung ca  . Cancer Brother        possible pancreas cancer   . Healthy Daughter   . Healthy Daughter   . Cancer Maternal Grandmother        breast  . Cancer Paternal Grandmother        breast    Social History   Socioeconomic History  . Marital status: Single    Spouse name: Not on file  . Number of children: 3  . Years of education: Not on file  . Highest education level: Not on  file  Occupational History  . Not on file  Tobacco Use  . Smoking status: Current Every Day Smoker    Packs/day: 1.50    Years: 50.00    Pack years: 75.00    Types: Cigarettes  . Smokeless tobacco: Never Used  . Tobacco comment: states she is trying to quit  Vaping Use  . Vaping Use: Never used  Substance and Sexual Activity  . Alcohol use: Not Currently    Comment: drank alcohol daily x20 years, quit 10 years ago  . Drug use: No  . Sexual activity: Not on file  Other Topics Concern  . Not on file  Social History Narrative   Not working, lives with one daughter   Has 3 children, 2 daughters and 1 son; son died early 27-Apr-2019 secondary to liver failure from drug-use related Hep C   Social Determinants of Health   Financial Resource Strain:   . Difficulty of Paying Living Expenses:   Food Insecurity:   . Worried About Charity fundraiser in the Last Year:   . Arboriculturist in the Last Year:   Transportation Needs:   . Film/video editor (Medical):   Marland Kitchen Lack of Transportation (Non-Medical):   Physical Activity:   . Days of Exercise per Week:   . Minutes of Exercise per Session:   Stress:   . Feeling of Stress :   Social Connections:   . Frequency of Communication with Friends and Family:   . Frequency of Social Gatherings with Friends and Family:   . Attends Religious Services:   . Active Member of Clubs or Organizations:   . Attends Archivist Meetings:   Marland Kitchen Marital Status:     Review of Systems: A 12 point ROS discussed and pertinent positives are indicated in the HPI above.  All other systems are negative.  Review of Systems  Vital Signs: BP (!) 155/85   Pulse 65   Temp 98.5 F (36.9 C) (Oral)   Resp 16   SpO2 98%   Physical Exam Constitutional:      Appearance: Normal appearance.  HENT:     Mouth/Throat:     Mouth: Mucous membranes are moist.     Pharynx: Oropharynx is clear.  Cardiovascular:     Rate and Rhythm: Normal rate and regular  rhythm.     Heart  sounds: Normal heart sounds.  Pulmonary:     Effort: Pulmonary effort is normal. No respiratory distress.     Breath sounds: Normal breath sounds.  Skin:    General: Skin is warm and dry.  Neurological:     General: No focal deficit present.     Mental Status: She is alert and oriented to person, place, and time.  Psychiatric:        Mood and Affect: Mood normal.        Thought Content: Thought content normal.        Judgment: Judgment normal.       Imaging: CT ABDOMEN PELVIS W CONTRAST  Result Date: 07/10/2019 CLINICAL DATA:  Pancreatic cancer, abdominal pain, recent endoscopy EXAM: CT ABDOMEN AND PELVIS WITH CONTRAST TECHNIQUE: Multidetector CT imaging of the abdomen and pelvis was performed using the standard protocol following bolus administration of intravenous contrast. CONTRAST:  172mL OMNIPAQUE IOHEXOL 300 MG/ML  SOLN COMPARISON:  05/28/2019 FINDINGS: Lower chest: No acute pleural or parenchymal lung disease. Hepatobiliary: No focal liver abnormality is seen. No gallstones, gallbladder wall thickening, or biliary dilatation. Pancreas: Interval postprocedural changes with fiduciary markers placed within the infiltrating mass involving the pancreatic body. Upstream pancreatic duct dilation within the pancreatic tail again noted unchanged. Fat stranding between the stomach and pancreatic mass likely related to recent transgastric biopsy. No evidence of fluid collection or hemorrhage. Spleen: Normal in size without focal abnormality. Adrenals/Urinary Tract: Adrenal glands are unremarkable. Kidneys are normal, without renal calculi, focal lesion, or hydronephrosis. Bladder is unremarkable. Stomach/Bowel: No bowel obstruction or ileus. No bowel wall thickening or inflammatory change. Vascular/Lymphatic: Extensive atherosclerosis of the aorta and its branches is again noted. There is extrinsic compression upon the portal vein, portal confluence, and splenic artery. 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 This is unchanged since recent exam. No discrete pathologic adenopathy identified. Reproductive: Status post hysterectomy. No adnexal masses. Other: There is trace free fluid within the lower pelvis, simple in appearance. No free intraperitoneal gas. No abdominal wall hernia. Musculoskeletal: No acute or destructive bony lesions. Reconstructed images demonstrate no additional findings. IMPRESSION: 1. Postprocedural changes from transgastric pancreatic biopsy and fiduciary marker placement. No fluid collection or hemorrhage. 2. Stable pancreatic body mass consistent with adenocarcinoma. Extrinsic compression on the adjacent vasculature as described above, unchanged since previous exam. 3. No evidence of intra-abdominal or intrapelvic metastases. 4. Trace pelvic free fluid. 5.  Aortic Atherosclerosis (ICD10-I70.0). Electronically Signed   By: Randa Ngo M.D.   On: 07/10/2019 22:54   DG Chest Portable 1 View  Result Date: 07/10/2019 CLINICAL DATA:  Epigastric pain EXAM: PORTABLE CHEST 1 VIEW COMPARISON:  12/21/2018 FINDINGS: There are atherosclerotic changes of the thoracic aorta. The heart size is stable. There is no pneumothorax. There is some atelectasis at the left lung base. No significant pleural effusion. IMPRESSION: No active disease. Electronically Signed   By: Constance Holster M.D.   On: 07/10/2019 22:14    Labs:  CBC: Recent Labs    05/28/19 1406 06/28/19 1424 07/10/19 2058 07/23/19 1331  WBC 4.6 5.3 6.9 4.6  HGB 12.5 11.6* 11.8* 11.4*  HCT 37.5 34.5* 35.3* 35.6*  PLT 194 165 113* 236    COAGS: No results for input(s): INR, APTT in the last 8760 hours.  BMP: Recent Labs    05/28/19 1406 06/28/19 1424 07/10/19 2058 07/23/19 1331  NA 138 139 133* 135  K 4.4 4.4 3.8 4.5  CL 100 103 94* 101  CO2 28 25 26 22   GLUCOSE 88 97 115* 86  BUN 14 10 16 11   CALCIUM 9.3  9.0 8.8* 9.0  CREATININE 1.16* 1.08* 1.13* 1.21*  GFRNONAA 46* 50* 47* 43*  GFRAA 53* 58* 55* 50*    LIVER FUNCTION TESTS: Recent Labs    05/28/19 1406 06/28/19 1424 07/10/19 2058 07/23/19 1331  BILITOT 0.4 0.7 0.8 0.4  AST 13* 13* 32 16  ALT 7 7 17  <6  ALKPHOS 137* 142* 122 137*  PROT 6.9 6.6 7.2 7.0  ALBUMIN 3.8 3.8 4.0 3.5    TUMOR MARKERS: No results for input(s): AFPTM, CEA, CA199, CHROMGRNA in the last 8760 hours.  Assessment and Plan: Pancreatic cancer For port placement Risks and benefits of image guided port-a-catheter placement was discussed with the patient including, but not limited to bleeding, infection, pneumothorax, or fibrin sheath development and need for additional procedures.  All of the patient's questions were answered, patient is agreeable to proceed. Consent signed and in chart.    Thank you for this interesting consult.  I greatly enjoyed meeting Allison Vaughan and look forward to participating in their care.  A copy of this report was sent to the requesting provider on this date.  Electronically Signed: Ascencion Dike, PA-C 07/27/2019, 1:15 PM   I spent a total of 20 minutes in face to face in clinical consultation, greater than 50% of which was counseling/coordinating care for port

## 2019-07-29 ENCOUNTER — Telehealth: Payer: Self-pay | Admitting: Hematology

## 2019-07-29 NOTE — Telephone Encounter (Signed)
Called pt per 7/15 sch msg - unable to reach . Left message for patient that tomorrow is booked

## 2019-07-29 NOTE — Progress Notes (Signed)
[place]   [phone] Fax:([phone]) [phone]   Clinic Follow up Note   Patient Care Team: Sandi Mariscal, MD as PCP - General (Internal Medicine) Debara Pickett Nadean Corwin, MD as PCP - Cardiology (Cardiology) Jonnie Finner, RN as Oncology Nurse Navigator Alla Feeling, NP as Nurse Practitioner (Nurse Practitioner) Truitt Merle, MD as Consulting Physician (Hematology) Mansouraty, Telford Nab., MD as Consulting Physician (Gastroenterology)  Date of Service:  07/30/2019  CHIEF COMPLAINT: F/u pancreatic cancer  SUMMARY OF ONCOLOGIC HISTORY: Oncology History Overview Note  Cancer Staging Primary adenocarcinoma of body of pancreas [facility]) Staging form: Exocrine Pancreas, AJCC 8th Edition - Clinical stage from 07/08/2019: Stage III (cT4, cN1, cM0) - Signed by Truitt Merle, MD on 07/13/2019    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[place])  12/21/2018 Imaging   CT AP w contrast IMPRESSION: 1. Large amount of stool at the level of the rectum. Correlate for symptoms of fecal impaction. 2. Persistent dilatation of the distal pancreatic duct with abrupt cutoff at the level of the pancreatic body. Further evaluation with a nonemergent outpatient contrast enhanced MRI is recommended. 3. Nonobstructive left nephrolithiasis. 4. Subacute anterior right rib fractures.   Aortic Atherosclerosis (ICD10-I70.0).   12/24/2018 Imaging   MRCP IMPRESSION: 1. Suspect a 3.5 cm infiltrating pancreatic body neoplasm causing obstruction of the main pancreatic duct and atrophy of the pancreatic tail. The pancreatic head appears normal. 2. Limited examination due to lack of IV contrast and breathing motion artifact. Difficult to assess the vascular structures for invasion. 3. Recommend endoscopic biopsy. A dedicated high-resolution pancreatic CT scan with contrast may be helpful for further evaluation. 4. No obvious metastatic disease.   12/26/2018 Tumor Marker   CA 19-9: 250     05/05/2019 Procedure   EUS by Dr. Paulita Fujita An irregular mass was identified in the pancreatic body. The mass was hypoechoic. The mass measured 24 mm by 17 mm in maximal cross-sectional diameter. The endosonographic borders were poorlydefined. The remainder of the pancreas was examined. The endosonographic appearance of parenchyma and the upstream pancreatic duct indicated duct dilation. Appeared to be tissue ingrowth into portions of the dilated pancreatic duct. Fine needle aspiration for cytology was performed.  A few abnormal lymph nodes were visualized in the peripancreatic region. The nodes were oval, hypoechoic and had well defined margins. IMPRESSION - A mass was identified in the pancreatic body. This was staged T3 N1 Mx byendosonographic criteria. Fine needle aspiration performed. 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 - A few abnormal lymph nodes were visualized in the peripancreatic region. - Pancreatic parenchymal abnormalities consisting of edema and stranding were noted in the genu of the pancreas and pancreatic body. - There was no evidence of significant pathology.   05/29/2019 Imaging   CT CAP w contrast IMPRESSION: 1. Interval increase in size of poorly defined, hypoenhancing, infiltrating tumor arising from the body and proximal tail of pancreas. 2. Progressive vascular involvement of the portal vein, portal venous confluence, splenic vein, and celiac artery. 3. No specific features of solid organ metastases or metastatic disease to the chest. 4. Similar appearance of tiny, upper lung zone predominant solid and ground-glass attenuating nodules which may be postinflammatory/infectious in etiology. 5. Aortic atherosclerosis, in addition to 3 vessel coronary artery disease. Please note that although the presence of coronary artery calcium documents the presence of coronary artery disease, the severity of this disease and any potential  stenosis cannot be assessed on this non-gated CT examination. Assessment for  potential risk factor modification, dietary therapy or pharmacologic therapy may be warranted, if clinically indicated.   Aortic Atherosclerosis (ICD10-I70.0).   07/08/2019 Procedure   Repeat EUS for biopsy and fiducial placement by Dr. Ardis Hughs   1. Irregularly shaped, heterogeneous, hypoechoic mass was noted in the body of the pancreas, measuring 2.7cm across. The mass clearly invades the portal vein and obstructs the main pancreatic duct causing upstream dilation (5-97m in the body and tail). Preliminary cytology review was positive for malignancy (adenocarcinoma) and then 4 gold fiducials were placed into the mass using the [employer] system. 2. CBD was normal, non-dilated. 3. Limited views of the liver, spleen were normal.      07/08/2019 Pathology Results   FINAL MICROSCOPIC DIAGNOSIS:  - Malignant cells consistent with adenocarcinoma    07/08/2019 Cancer Staging   Staging form: Exocrine Pancreas, AJCC 8th Edition - Clinical stage from 07/08/2019: Stage III (cT4, cN1, cM0) - Signed by FTruitt Merle MD on 07/13/2019   07/12/2019 Initial Diagnosis   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[place]   07/23/2019 -  Chemotherapy   First-line Gemcitabine and Abraxane 2 weeks on/1 week off starting 07/23/19 with Gemcitabine alone for C1.   07/27/2019 Procedure   PAC placement       CURRENT THERAPY:  First-line Gemcitabine and Abraxane 2 weeks on/1 week off starting 07/23/19 with Gemcitabine alone for C1.  INTERVAL HISTORY:  CSHALLYN CONSTANCIOis here for a follow up and treatment. She presents to the clinic alone. She notes she does not drive or have a car. She had to take Cab here today. She is agreeable to use [facility] resources to get her cab fair covered going home. She notes she is doing well. She notes BP has never been as high as it is today at 206/91. She notes today has not been a good  morning so far. She notes having trouble sleeping. She has issues falling asleep and staying asleep. She will occasionally take Ativan at night which does help her sleep. She takes Oxycodone for back pain 1 tab every 4 hours and tries not to take it every time. She also notes upper abdominal pain. She notes she trying to reduce smoking but finds it hard to do. She notes her family smokes around her.    REVIEW OF SYSTEMS:   Constitutional: Denies fevers, chills or abnormal weight loss (+) Insomnia  Eyes: Denies blurriness of vision Ears, nose, mouth, throat, and face: Denies mucositis or sore throat Respiratory: Denies cough, dyspnea or wheezes Cardiovascular: Denies palpitation, chest discomfort or lower extremity swelling Gastrointestinal:  Denies nausea, heartburn or change in bowel habits (+) upper abdominal pain  Skin: Denies abnormal skin rashes MSK: (+) Chronic back pain  Lymphatics: Denies new lymphadenopathy or easy bruising Neurological:Denies numbness, tingling or new weaknesses Behavioral/Psych: Mood is stable, no new changes  All other systems were reviewed with the patient and are negative.  MEDICAL HISTORY:  Past Medical History:  Diagnosis Date  . Accidental drug overdose   . Acute encephalopathy   . Acute on chronic respiratory failure with hypoxia ([place] 03/01/2017  . Acute renal failure ([place] 04/16/2014  . AKI (acute kidney injury) ([place] 04/30/2014  . Altered mental status 06/27/2012  . Anxiety   . Atrial fibrillation ([place]   . CAD (coronary artery disease)   . CAP (community acquired pneumonia) 04/18/2014  . Cardiomyopathy ([place]   . Chronic hypoxemic respiratory failure ([place] 06/26/2012  . Chronic pain   .  COPD exacerbation ([place]) 06/26/2012  . COPD with acute exacerbation ([place]) 03/01/2017  . DEPRESSIVE DISORDER, NOS 03/13/2006   Qualifier: Diagnosis of  By: Damita Dunnings MD, Phillip Heal    . Edema, peripheral   . Essential hypertension 04/30/2014  . Fall at home, initial encounter  03/01/2017  . GASTROESOPHAGEAL REFLUX, NO ESOPHAGITIS 03/13/2006   Qualifier: Diagnosis of  By: Damita Dunnings MD, Phillip Heal    . HYPERCHOLESTEROLEMIA 03/13/2006   Qualifier: Diagnosis of  By: Damita Dunnings MD, Phillip Heal    . Hyperlipidemia   . HYPERTENSION, BENIGN SYSTEMIC 03/13/2006   Qualifier: Diagnosis of  By: Damita Dunnings MD, Phillip Heal    . Hypokalemia 04/30/2014  . Hyponatremia 04/30/2014  . Hypotension 04/30/2014  . Hypoxemia 06/26/2012  . Hypoxia 03/26/2018  . Irritable bowel syndrome 03/13/2006   Qualifier: Diagnosis of  By: Damita Dunnings MD, Phillip Heal    . Lung nodule 04/16/2014  . MENOPAUSAL SYNDROME 03/13/2006   Qualifier: Diagnosis of  By: Damita Dunnings MD, Phillip Heal    . Mood disorder [facility]) 03/13/2006   Qualifier: Diagnosis of  By: Damita Dunnings MD, Phillip Heal    . 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 Nausea 04/30/2014  . Nausea vomiting and diarrhea 04/16/2014  . OSTEOARTHRITIS, MULTI SITES 03/13/2006   Qualifier: Diagnosis of  By: Damita Dunnings MD, Phillip Heal    . Pulmonary nodule 04/30/2014  . Respiratory failure ([place]) 01/22/2017  . TOBACCO DEPENDENCE 03/13/2006   Qualifier: Diagnosis of  By: Damita Dunnings MD, Phillip Heal    . Weight loss     SURGICAL HISTORY: Past Surgical History:  Procedure Laterality Date  . ABDOMINAL HYSTERECTOMY    . ESOPHAGOGASTRODUODENOSCOPY N/A 07/08/2019   Procedure: ESOPHAGOGASTRODUODENOSCOPY (EGD);  Surgeon: Milus Banister, MD;  Location: [facility];  Service: Endoscopy;  Laterality: N/A;  . ESOPHAGOGASTRODUODENOSCOPY (EGD) WITH PROPOFOL N/A 05/05/2019   Procedure: ESOPHAGOGASTRODUODENOSCOPY (EGD) WITH PROPOFOL;  Surgeon: Arta Silence, MD;  Location: [facility];  Service: Endoscopy;  Laterality: N/A;  . EUS N/A 07/08/2019   Procedure: UPPER ENDOSCOPIC ULTRASOUND (EUS) RADIAL;  Surgeon: Milus Banister, MD;  Location: [facility];  Service: Endoscopy;  Laterality: N/A;  . EYE SURGERY    . FIDUCIAL MARKER PLACEMENT N/A 07/08/2019   Procedure: FIDUCIAL MARKER PLACEMENT;  Surgeon: Milus Banister, MD;  Location: [facility]  END[facility];  Service: Endoscopy;  Laterality: N/A;  . FINE NEEDLE ASPIRATION N/A 05/05/2019   Procedure: FINE NEEDLE ASPIRATION (FNA) LINEAR;  Surgeon: Arta Silence, MD;  Location: [facility];  Service: Endoscopy;  Laterality: N/A;  . FINE NEEDLE ASPIRATION N/A 07/08/2019   Procedure: FINE NEEDLE ASPIRATION (FNA) LINEAR;  Surgeon: Milus Banister, MD;  Location: [facility];  Service: Endoscopy;  Laterality: N/A;  . IR IMAGING GUIDED PORT INSERTION  07/27/2019  . ROTATOR CUFF REPAIR Right 2012  . UPPER ESOPHAGEAL ENDOSCOPIC ULTRASOUND (EUS) N/A 05/05/2019   Procedure: UPPER ESOPHAGEAL ENDOSCOPIC ULTRASOUND (EUS) with FNA;  Surgeon: Arta Silence, MD;  Location: [facility];  Service: Endoscopy;  Laterality: N/A;    I have reviewed the social history and family history with the patient and they are unchanged from previous note.  ALLERGIES:  is allergic to compazine [prochlorperazine edisylate].  MEDICATIONS:  Current Outpatient Medications  Medication Sig Dispense Refill  . albuterol (PROVENTIL HFA;VENTOLIN HFA) 108 (90 BASE) MCG/ACT inhaler Inhale 2 puffs into the lungs every 6 (six) hours as needed for wheezing. 1 Inhaler 2  . albuterol (PROVENTIL) (2.5 MG/3ML) 0.083% nebulizer solution Take 3 mLs (2.5 mg total) by nebulization every 2 (two) hours as needed for wheezing. 75 mL  12  . apixaban (ELIQUIS) 5 MG TABS tablet Take 1 tablet (5 mg total) by mouth 2 (two) times daily. (Patient taking differently: Take 5 mg by mouth daily. ) 180 tablet 3  . Aspirin-Salicylamide-Caffeine (BC HEADACHE PO) Take 1 packet by mouth 2 (two) times daily as needed (headaches).     Marland Kitchen atorvastatin (LIPITOR) 10 MG tablet Take 1 tablet (10 mg total) by mouth at bedtime. (Patient taking differently: Take 10 mg by mouth every evening. ) 30 tablet 0  . FLUoxetine (PROZAC) 40 MG capsule Take 40 mg by mouth every evening.     . lidocaine-prilocaine (EMLA) cream Apply to affected area as needed weekly 30 g 3  .  LORazepam (ATIVAN) 0.5 MG tablet Take 0.5 mg by mouth at bedtime.     Marland Kitchen losartan (COZAAR) 25 MG tablet Take 1 tablet (25 mg total) by mouth daily. (Patient taking differently: Take 25 mg by mouth every evening. ) 90 tablet 3  . metoprolol succinate (TOPROL-XL) 25 MG 24 hr tablet Take 2 tablets (50 mg total) by mouth daily. Take with or immediately following a meal. (Patient taking differently: Take 50 mg by mouth every evening. Take with or immediately following a meal.) 90 tablet 3  . naproxen sodium (ALEVE) 220 MG tablet Take 220 mg by mouth daily as needed (headache).    . nitroGLYCERIN (NITROSTAT) 0.4 MG SL tablet Place 1 tablet (0.4 mg total) under the tongue every 5 (five) minutes as needed for chest pain. 90 tablet 3  . Nutritional Supplements (ENSURE ENLIVE PO) Take 237 mLs by mouth daily as needed (when want too).     Marland Kitchen omeprazole (PRILOSEC) 40 MG capsule Take 40 mg by mouth every evening.     . ondansetron (ZOFRAN ODT) 4 MG disintegrating tablet Take 1 tablet (4 mg total) by mouth every 6 (six) hours as needed for nausea or vomiting. 30 tablet 0  . ondansetron (ZOFRAN) 8 MG tablet Take 1 tablet (8 mg total) by mouth 2 (two) times daily as needed (Nausea or vomiting). 30 tablet 1  . oxyCODONE 10 MG TABS Take 1 tablet (10 mg total) by mouth every 6 (six) hours as needed for severe pain. 20 tablet 0  . potassium chloride (KLOR-CON) 10 MEQ tablet Take 10 mEq by mouth every evening.     . prochlorperazine (COMPAZINE) 10 MG tablet Take 1 tablet (10 mg total) by mouth every 6 (six) hours as needed (Nausea or vomiting). 30 tablet 1  . umeclidinium bromide (INCRUSE ELLIPTA) 62.5 MCG/INH AEPB Inhale 1 puff into the lungs daily. 30 each 0   No current facility-administered medications for this visit.   Facility-Administered Medications Ordered in Other Visits  Medication Dose Route Frequency Provider Last Rate Last Admin  . gemcitabine (GEMZAR) 1,178 mg in sodium chloride 0.9 % 250 mL chemo infusion   800 mg/m2 (Treatment Plan Recorded) Intravenous Once Truitt Merle, MD 562 mL/hr at 07/30/19 1559 1,178 mg at 07/30/19 1559  . heparin lock flush 100 unit/mL  500 Units Intracatheter Once PRN Truitt Merle, MD      . sodium chloride flush (NS) 0.9 % injection 10 mL  10 mL Intracatheter PRN Truitt Merle, MD        PHYSICAL EXAMINATION: ECOG PERFORMANCE STATUS: 1 - Symptomatic but completely ambulatory  Vitals:   07/30/19 1040  BP: (!) 206/91  Pulse: 60  Resp: 16  Temp: 98 F (36.7 C)  SpO2: 100%   Filed Weights   07/30/19 1040  Weight: 111 lb 3.2 oz (50.4 kg)    Due to COVID19 we will limit examination to appearance. Patient had no complaints.  GENERAL:alert, no distress and comfortable SKIN: skin color normal, no rashes or significant lesions EYES: normal, Conjunctiva are pink and non-injected, sclera clear  NEURO: alert & oriented x 3 with fluent speech   LABORATORY DATA:  I have reviewed the data as listed CBC Latest Ref Rng & Units 07/30/2019 07/27/2019 07/23/2019  WBC 4.0 - 10.5 K/uL 2.0(L) 3.5(L) 4.6  Hemoglobin 12.0 - 15.0 g/dL 10.2(L) 10.0(L) 11.4(L)  Hematocrit 36 - 46 % 30.8(L) 31.1(L) 35.6(L)  Platelets 150 - 400 K/uL 143(L) 175 236     CMP Latest Ref Rng & Units 07/30/2019 07/23/2019 07/10/2019  Glucose 70 - 99 mg/dL 118(H) 86 115(H)  BUN 8 - 23 mg/dL '10 11 16  '$ Creatinine 0.44 - 1.00 mg/dL 0.99 1.21(H) 1.13(H)  Sodium 135 - 145 mmol/L 136 135 133(L)  Potassium 3.5 - 5.1 mmol/L 3.8 4.5 3.8  Chloride 98 - 111 mmol/L 101 101 94(L)  CO2 22 - 32 mmol/L '26 22 26  '$ Calcium 8.9 - 10.3 mg/dL 8.9 9.0 8.8(L)  Total Protein 6.5 - 8.1 g/dL 6.3(L) 7.0 7.2  Total Bilirubin 0.3 - 1.2 mg/dL 0.3 0.4 0.8  Alkaline Phos 38 - 126 U/L 126 137(H) 122  AST 15 - 41 U/L 12(L) 16 32  ALT 0 - 44 U/L <6 <6 17      RADIOGRAPHIC STUDIES: I have personally reviewed the radiological images as listed and agreed with the findings in the report. No results found.   ASSESSMENT & PLAN:  Allison Vaughan is a 74 y.o. female with    1. Pancreatic neoplasm,cT4N1M0, unresectable -She initially presented with gradual weight loss and abdominal discomfort with low back pain for 6-12 months, CA 19-9 is elevated to 250. 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  -HerEUS/biopsyfrom 07/08/19 showed malignant cells consistent with adenocarcinoma. We discussed the general clinical course for pancreatic adenocarcinoma. Due to vascular invasion, she is not a surgical candidate thus her cancer is no longer curable but still treatable to control disease and prolong her life.  -I discussed systemicmoderate intensitychemotherapy with gemcitabine/Abraxane (withsingle agentgemcitabine for cycle 1)on days 1 and 8 q21 days starting 07/23/19,followed by SBRT.  -Her PAC was placed 07/27/19 -She tolerated first dose of Gemcitabine alone well.  Labs reviewed, WBC 2, Hg 10.2, plt 143K, ANC 0.9, BG 118, protein 6.3, albumin 3.3. Overall adequate to proceed with C1D8 Gemcitabine. Due to her neutropenia, will add GCSF after day 8 chemo  -If she continues to tolerate Gemcitabine alone, will add Abraxane and dose reduce both starting with C2.  -F/u in 2 weeks    2. Abdominal bloating, weight loss, low back pain -Her abdominal bloating/discomfort and weight loss are secondary to malignancy, has been gradual over 6-12 months. -She weighed 150-160 lbs about 1.5 years ago. She attributes some of her weight loss to grieving her son's death in 03-31-2019. She has been able to gain some weight lately.  -She has had chronic back pain and neuropathy in her feet which has been managed on Oxycodone '10mg'$  q6hours by her PCP Dr Nancy Fetter.  -She has started to have abdominal pain secondary to her cancer. Per pt, Dr Nancy Fetter does not plan to titrate up her medication and deferred to our clinic for  additional medication. If she wants our  clinic to manage her pain meds (likely by Dr Nancy Nordmann from palliative care), she should discuss this with Dr Nancy Fetter.    3. Comorbidities: COPD, Anxiety and depression, Afib, CAD, HTN, Insomnia  -controlled, not oxygen dependent. On albuterol PRN  -She has 50 year history of smoking 1-2PPD. She continues to smoke, counseled on cessation. She is interested in quitting, but struggles because family still smokes around her.  -Anxiety/depression is chronic, controlled. Her mood is stable on Prozac and Ativan -She struggles to fall asleep and stay asleep. She takes Ativan at night occasionally which has helped. I recommend she F/u with her PCP for refill or discuss having [place] take over.   -In past few months stopped losartan and metoprolol due to low BP, dizziness, and disorientation. Her BP has been high lately, BP at 206/91 today (07/30/19). Will recheck BP in infusion room.   4. Goals of care, Social Support -she understands because her cancer is not operable it is likely not curable. Treatment goal is palliative. -She currently does not have car and is not driving. She has some help with transportation from her daughter. I will defer to SW office to set up rides for her. She is agreeable.  5. Genetic Testing  -Due to her personal and family history of cancer, she qualifies for genetics. If found to be BRCA positive she would be a candidate for parp inhibitor. She is interested.She has family history of multiple cancers. She was previously referred to genetics to discuss appropriate testing.  -will refer again   6. Memory loss  -she appears to have short-term memory loss.  I clearly explained to her about the chemo treatment today, but she left our cancer center without been treatment, we called her back  -we called her daughter and asked her family to be present for her future appointment  -will likely short her chemo course and proceed with RT sooner     PLAN:  -Copy note to Dr Nancy Fetter about taking over her pain medication and Ativan management.  -Labs reviewed and adequate to proceed with C1D8 Gemcitabine today  -Lab, flush, f/u and Gemcitabine and Abraxane in 2 and 3 weeks  -add Udenyca after day 8 chemotherapy   No problem-specific Assessment & Plan notes found for this encounter.   No orders of the defined types were placed in this encounter.  All questions were answered. The patient knows to call the clinic with any problems, questions or concerns. No barriers to learning was detected. The total time spent in the appointment was 30 minutes.     Truitt Merle, MD 07/30/2019   I, Joslyn Devon, am acting as scribe for Truitt Merle, MD.   I have reviewed the above documentation for accuracy and completeness, and I agree with the above.

## 2019-07-30 ENCOUNTER — Inpatient Hospital Stay (HOSPITAL_BASED_OUTPATIENT_CLINIC_OR_DEPARTMENT_OTHER): Payer: Medicare HMO | Admitting: Hematology

## 2019-07-30 ENCOUNTER — Inpatient Hospital Stay: Payer: Medicare HMO

## 2019-07-30 ENCOUNTER — Encounter: Payer: Self-pay | Admitting: Hematology

## 2019-07-30 ENCOUNTER — Other Ambulatory Visit: Payer: Self-pay

## 2019-07-30 VITALS — BP 206/91 | HR 60 | Temp 98.0°F | Resp 16 | Ht 60.0 in | Wt 111.2 lb

## 2019-07-30 VITALS — BP 176/82 | HR 62 | Temp 99.8°F | Resp 20

## 2019-07-30 DIAGNOSIS — C251 Malignant neoplasm of body of pancreas: Secondary | ICD-10-CM

## 2019-07-30 DIAGNOSIS — I10 Essential (primary) hypertension: Secondary | ICD-10-CM | POA: Diagnosis not present

## 2019-07-30 DIAGNOSIS — C259 Malignant neoplasm of pancreas, unspecified: Secondary | ICD-10-CM

## 2019-07-30 DIAGNOSIS — Z95828 Presence of other vascular implants and grafts: Secondary | ICD-10-CM

## 2019-07-30 DIAGNOSIS — J42 Unspecified chronic bronchitis: Secondary | ICD-10-CM | POA: Diagnosis not present

## 2019-07-30 DIAGNOSIS — Z7189 Other specified counseling: Secondary | ICD-10-CM

## 2019-07-30 DIAGNOSIS — Z5111 Encounter for antineoplastic chemotherapy: Secondary | ICD-10-CM | POA: Diagnosis not present

## 2019-07-30 DIAGNOSIS — K8689 Other specified diseases of pancreas: Secondary | ICD-10-CM

## 2019-07-30 LAB — CBC WITH DIFFERENTIAL (CANCER CENTER ONLY)
Abs Immature Granulocytes: 0.01 10*3/uL (ref 0.00–0.07)
Basophils Absolute: 0 10*3/uL (ref 0.0–0.1)
Basophils Relative: 1 %
Eosinophils Absolute: 0 10*3/uL (ref 0.0–0.5)
Eosinophils Relative: 1 %
HCT: 30.8 % — ABNORMAL LOW (ref 36.0–46.0)
Hemoglobin: 10.2 g/dL — ABNORMAL LOW (ref 12.0–15.0)
Immature Granulocytes: 1 %
Lymphocytes Relative: 42 %
Lymphs Abs: 0.8 10*3/uL (ref 0.7–4.0)
MCH: 28.4 pg (ref 26.0–34.0)
MCHC: 33.1 g/dL (ref 30.0–36.0)
MCV: 85.8 fL (ref 80.0–100.0)
Monocytes Absolute: 0.2 10*3/uL (ref 0.1–1.0)
Monocytes Relative: 9 %
Neutro Abs: 0.9 10*3/uL — ABNORMAL LOW (ref 1.7–7.7)
Neutrophils Relative %: 46 %
Platelet Count: 143 10*3/uL — ABNORMAL LOW (ref 150–400)
RBC: 3.59 MIL/uL — ABNORMAL LOW (ref 3.87–5.11)
RDW: 13.2 % (ref 11.5–15.5)
WBC Count: 2 10*3/uL — ABNORMAL LOW (ref 4.0–10.5)
nRBC: 0 % (ref 0.0–0.2)

## 2019-07-30 LAB — CMP (CANCER CENTER ONLY)
ALT: <6 U/L (ref 0–44)
AST: 12 U/L — ABNORMAL LOW (ref 15–41)
Albumin: 3.3 g/dL — ABNORMAL LOW (ref 3.5–5.0)
Alkaline Phosphatase: 126 U/L (ref 38–126)
Anion gap: 9 (ref 5–15)
BUN: 10 mg/dL (ref 8–23)
CO2: 26 mmol/L (ref 22–32)
Calcium: 8.9 mg/dL (ref 8.9–10.3)
Chloride: 101 mmol/L (ref 98–111)
Creatinine: 0.99 mg/dL (ref 0.44–1.00)
GFR, Est AFR Am: >60 mL/min (ref >60)
GFR, Estimated: 55 mL/min — ABNORMAL LOW (ref >60)
Glucose, Bld: 118 mg/dL — ABNORMAL HIGH (ref 70–99)
Potassium: 3.8 mmol/L (ref 3.5–5.1)
Sodium: 136 mmol/L (ref 135–145)
Total Bilirubin: 0.3 mg/dL (ref 0.3–1.2)
Total Protein: 6.3 g/dL — ABNORMAL LOW (ref 6.5–8.1)

## 2019-07-30 MED ORDER — ONDANSETRON HCL 8 MG PO TABS
ORAL_TABLET | ORAL | Status: AC
  Filled 2019-07-30: qty 1

## 2019-07-30 MED ORDER — HEPARIN SOD (PORK) LOCK FLUSH 100 UNIT/ML IV SOLN
500.0000 [IU] | Freq: Once | INTRAVENOUS | Status: AC | PRN
  Administered 2019-07-30: 500 [IU]
  Filled 2019-07-30: qty 5

## 2019-07-30 MED ORDER — SODIUM CHLORIDE 0.9% FLUSH
10.0000 mL | INTRAVENOUS | Status: DC | PRN
  Administered 2019-07-30: 10 mL
  Filled 2019-07-30: qty 10

## 2019-07-30 MED ORDER — CLONIDINE HCL 0.1 MG PO TABS
ORAL_TABLET | ORAL | Status: AC
  Filled 2019-07-30: qty 1

## 2019-07-30 MED ORDER — CLONIDINE HCL 0.1 MG PO TABS
0.1000 mg | ORAL_TABLET | Freq: Once | ORAL | Status: AC
  Administered 2019-07-30: 0.1 mg via ORAL

## 2019-07-30 MED ORDER — SODIUM CHLORIDE 0.9 % IV SOLN
Freq: Once | INTRAVENOUS | Status: AC
  Filled 2019-07-30: qty 250

## 2019-07-30 MED ORDER — ONDANSETRON HCL 8 MG PO TABS
8.0000 mg | ORAL_TABLET | Freq: Once | ORAL | Status: AC
  Administered 2019-07-30: 8 mg via ORAL

## 2019-07-30 MED ORDER — SODIUM CHLORIDE 0.9 % IV SOLN
800.0000 mg/m2 | Freq: Once | INTRAVENOUS | Status: AC
  Administered 2019-07-30: 1178 mg via INTRAVENOUS
  Filled 2019-07-30: qty 30.98

## 2019-07-30 MED ORDER — SODIUM CHLORIDE 0.9% FLUSH
10.0000 mL | Freq: Once | INTRAVENOUS | Status: AC
  Administered 2019-07-30: 10 mL
  Filled 2019-07-30: qty 10

## 2019-07-30 NOTE — Progress Notes (Unsigned)
Pt. left cancer center without treatment as she didn't realize she was suppose to get treatment today. Went to front desk and had a cab called. Called and spoke to daughter [name] and told her I sent a scheduling message for pt. to be reschedule for Monday, 08/02/19. Also told her pt. left with port a cath accessed and needed her to come back for the port to be de-accessed. She states she understands and her sister will bring her back in today.

## 2019-07-30 NOTE — Patient Instructions (Addendum)
[facility] Discharge Instructions for Patients Receiving Chemotherapy  Today you received the following chemotherapy agent: Gemcitabine (gemzar)  To help prevent nausea and vomiting after your treatment, we encourage you to take your nausea medication as directed by your MD.   If you develop nausea and vomiting that is not controlled by your nausea medication, call the clinic.   BELOW ARE SYMPTOMS THAT SHOULD BE REPORTED IMMEDIATELY:  *FEVER GREATER THAN 100.5 F  *CHILLS WITH OR WITHOUT FEVER  NAUSEA AND VOMITING THAT IS NOT CONTROLLED WITH YOUR NAUSEA MEDICATION  *UNUSUAL SHORTNESS OF BREATH  *UNUSUAL BRUISING OR BLEEDING  TENDERNESS IN MOUTH AND THROAT WITH OR WITHOUT PRESENCE OF ULCERS  *URINARY PROBLEMS  *BOWEL PROBLEMS  UNUSUAL RASH Items with * indicate a potential emergency and should be followed up as soon as possible.  Feel free to call the clinic should you have any questions or concerns. The clinic phone number is [phone].  Please show the [place] at check-in to the Emergency Department and triage nurse.  Clonidine tablets What is this medicine? CLONIDINE (KLOE ni deen) is used to treat high blood pressure. This medicine may be used for other purposes; ask your health care provider or pharmacist if you have questions. COMMON BRAND NAME(S): Catapres What should I tell my health care provider before I take this medicine? They need to know if you have any of these conditions:  kidney disease  an unusual or allergic reaction to clonidine, other medicines, foods, dyes, or preservatives  pregnant or trying to get pregnant  breast-feeding How should I use this medicine? Take this medicine by mouth with a glass of water. Follow the directions on the prescription label. Take your doses at regular intervals. Do not take your medicine more often than directed. Do not suddenly stop taking this medicine. You must gradually reduce the dose  or you may get a dangerous increase in blood pressure. Ask your doctor or health care professional for advice. Talk to your pediatrician regarding the use of this medicine in children. Special care may be needed. Overdosage: If you think you have taken too much of this medicine contact a poison control center or emergency room at once. NOTE: This medicine is only for you. Do not share this medicine with others. What if I miss a dose? If you miss a dose, take it as soon as you can. If it is almost time for your next dose, take only that dose. Do not take double or extra doses. What may interact with this medicine? Do not take this medicine with any of the following medications:  MAOIs like Carbex, Eldepryl, Marplan, Nardil, and Parnate This medicine may also interact with the following medications:  barbiturate medicines for inducing sleep or treating seizures like phenobarbital  certain medicines for blood pressure, heart disease, irregular heart beat  certain medicines for depression, anxiety, or psychotic disturbances  prescription pain medicines This list may not describe all possible interactions. Give your health care provider a list of all the medicines, herbs, non-prescription drugs, or dietary supplements you use. Also tell them if you smoke, drink alcohol, or use illegal drugs. Some items may interact with your medicine. What should I watch for while using this medicine? Visit your doctor or health care professional for regular checks on your progress. Check your heart rate and blood pressure regularly while you are taking this medicine. Ask your doctor or health care professional what your heart rate should be and when  you should contact him or her. You may get drowsy or dizzy. Do not drive, use machinery, or do anything that needs mental alertness until you know how this medicine affects you. To avoid dizzy or fainting spells, do not stand or sit up quickly, especially if you are an  older person. Alcohol can make you more drowsy and dizzy. Avoid alcoholic drinks. Your mouth may get dry. Chewing sugarless gum or sucking hard candy, and drinking plenty of water will help. Do not treat yourself for coughs, colds, or pain while you are taking this medicine without asking your doctor or health care professional for advice. Some ingredients may increase your blood pressure. If you are going to have surgery tell your doctor or health care professional that you are taking this medicine. What side effects may I notice from receiving this medicine? Side effects that you should report to your doctor or health care professional as soon as possible:  allergic reactions like skin rash, itching or hives, swelling of the face, lips, or tongue  anxiety, nervousness  chest pain  depression  fast, irregular heartbeat  swelling of feet or legs  unusually weak or tired Side effects that usually do not require medical attention (report to your doctor or health care professional if they continue or are bothersome):  change in sex drive or performance  constipation  headache This list may not describe all possible side effects. Call your doctor for medical advice about side effects. You may report side effects to FDA at 1-800-FDA-1088. Where should I keep my medicine? Keep out of the reach of children. Store at room temperature between 15 and 30 degrees C (59 and 86 degrees F). Protect from light. Keep container tightly closed. Throw away any unused medicine after the expiration date. NOTE: This sheet is a summary. It may not cover all possible information. If you have questions about this medicine, talk to your doctor, pharmacist, or health care provider.  2020 [employer] (2010-06-27 13:01:28)

## 2019-08-02 ENCOUNTER — Ambulatory Visit: Payer: Medicare HMO

## 2019-08-02 ENCOUNTER — Telehealth: Payer: Self-pay | Admitting: Hematology

## 2019-08-02 ENCOUNTER — Inpatient Hospital Stay: Payer: Medicare HMO | Admitting: Genetic Counselor

## 2019-08-02 NOTE — Telephone Encounter (Signed)
No 7/16 los

## 2019-08-03 ENCOUNTER — Inpatient Hospital Stay: Payer: Medicare HMO

## 2019-08-03 ENCOUNTER — Telehealth: Payer: Self-pay | Admitting: Hematology

## 2019-08-03 NOTE — Telephone Encounter (Signed)
Cancelled injection scheduled today 7/20 at 3:30.Unable to reach pt. Left voicemail to let pt know insurance will not cover injection and that appt has been cancelled. Per 7/19 staff message.

## 2019-08-04 ENCOUNTER — Telehealth: Payer: Self-pay | Admitting: Hematology

## 2019-08-04 NOTE — Telephone Encounter (Signed)
I spoke with pt's PCP Dr. Nancy Fetter and reviewed her current cancer diagnosis and treatment status. Dr. Nancy Fetter agrees to continue prescribing Oxycodone and lorazepam and titrate her dose if needed, and I will not refill these meds.  I will inform pt about this.  Truitt Merle  08/04/2019

## 2019-08-05 ENCOUNTER — Telehealth: Payer: Self-pay | Admitting: Hematology

## 2019-08-05 NOTE — Telephone Encounter (Signed)
Scheduled per 7/21 staff message. Spoke with pt's daughter renee and is aware of appts added.

## 2019-08-05 NOTE — Telephone Encounter (Signed)
Scheduled per 7/21 staff message. Spoke with pt's daughter and is aware of appts on 7/23.

## 2019-08-06 ENCOUNTER — Inpatient Hospital Stay: Payer: Medicare HMO

## 2019-08-06 ENCOUNTER — Other Ambulatory Visit: Payer: Self-pay

## 2019-08-06 VITALS — BP 152/72 | HR 73 | Resp 17

## 2019-08-06 DIAGNOSIS — K8689 Other specified diseases of pancreas: Secondary | ICD-10-CM

## 2019-08-06 DIAGNOSIS — Z7189 Other specified counseling: Secondary | ICD-10-CM

## 2019-08-06 DIAGNOSIS — Z5111 Encounter for antineoplastic chemotherapy: Secondary | ICD-10-CM | POA: Diagnosis not present

## 2019-08-06 DIAGNOSIS — Z95828 Presence of other vascular implants and grafts: Secondary | ICD-10-CM

## 2019-08-06 DIAGNOSIS — C251 Malignant neoplasm of body of pancreas: Secondary | ICD-10-CM

## 2019-08-06 LAB — CMP (CANCER CENTER ONLY)
ALT: <6 U/L (ref 0–44)
AST: 15 U/L (ref 15–41)
Albumin: 3.4 g/dL — ABNORMAL LOW (ref 3.5–5.0)
Alkaline Phosphatase: 133 U/L — ABNORMAL HIGH (ref 38–126)
Anion gap: 13 (ref 5–15)
BUN: 12 mg/dL (ref 8–23)
CO2: 21 mmol/L — ABNORMAL LOW (ref 22–32)
Calcium: 8.7 mg/dL — ABNORMAL LOW (ref 8.9–10.3)
Chloride: 106 mmol/L (ref 98–111)
Creatinine: 1.12 mg/dL — ABNORMAL HIGH (ref 0.44–1.00)
GFR, Est AFR Am: 55 mL/min — ABNORMAL LOW (ref >60)
GFR, Estimated: 48 mL/min — ABNORMAL LOW (ref >60)
Glucose, Bld: 93 mg/dL (ref 70–99)
Potassium: 4.4 mmol/L (ref 3.5–5.1)
Sodium: 140 mmol/L (ref 135–145)
Total Bilirubin: 0.4 mg/dL (ref 0.3–1.2)
Total Protein: 6.1 g/dL — ABNORMAL LOW (ref 6.5–8.1)

## 2019-08-06 LAB — CBC WITH DIFFERENTIAL (CANCER CENTER ONLY)
Abs Immature Granulocytes: 0.02 10*3/uL (ref 0.00–0.07)
Basophils Absolute: 0 10*3/uL (ref 0.0–0.1)
Basophils Relative: 1 %
Eosinophils Absolute: 0 10*3/uL (ref 0.0–0.5)
Eosinophils Relative: 1 %
HCT: 30.7 % — ABNORMAL LOW (ref 36.0–46.0)
Hemoglobin: 10.2 g/dL — ABNORMAL LOW (ref 12.0–15.0)
Immature Granulocytes: 1 %
Lymphocytes Relative: 47 %
Lymphs Abs: 0.9 10*3/uL (ref 0.7–4.0)
MCH: 29.1 pg (ref 26.0–34.0)
MCHC: 33.2 g/dL (ref 30.0–36.0)
MCV: 87.5 fL (ref 80.0–100.0)
Monocytes Absolute: 0.2 10*3/uL (ref 0.1–1.0)
Monocytes Relative: 10 %
Neutro Abs: 0.8 10*3/uL — ABNORMAL LOW (ref 1.7–7.7)
Neutrophils Relative %: 40 %
Platelet Count: 54 10*3/uL — ABNORMAL LOW (ref 150–400)
RBC: 3.51 MIL/uL — ABNORMAL LOW (ref 3.87–5.11)
RDW: 14.4 % (ref 11.5–15.5)
WBC Count: 1.9 10*3/uL — ABNORMAL LOW (ref 4.0–10.5)
nRBC: 0 % (ref 0.0–0.2)

## 2019-08-06 MED ORDER — PEGFILGRASTIM-CBQV 6 MG/0.6ML ~~LOC~~ SOSY
PREFILLED_SYRINGE | SUBCUTANEOUS | Status: AC
  Filled 2019-08-06: qty 0.6

## 2019-08-06 MED ORDER — PEGFILGRASTIM-CBQV 6 MG/0.6ML ~~LOC~~ SOSY
6.0000 mg | PREFILLED_SYRINGE | Freq: Once | SUBCUTANEOUS | Status: AC
  Administered 2019-08-06: 6 mg via SUBCUTANEOUS

## 2019-08-06 MED ORDER — HEPARIN SOD (PORK) LOCK FLUSH 100 UNIT/ML IV SOLN
500.0000 [IU] | Freq: Once | INTRAVENOUS | Status: AC
  Administered 2019-08-06: 500 [IU]
  Filled 2019-08-06: qty 5

## 2019-08-06 MED ORDER — SODIUM CHLORIDE 0.9% FLUSH
10.0000 mL | Freq: Once | INTRAVENOUS | Status: AC
  Administered 2019-08-06: 10 mL
  Filled 2019-08-06: qty 10

## 2019-08-06 NOTE — Patient Instructions (Signed)

## 2019-08-09 ENCOUNTER — Encounter: Payer: Self-pay | Admitting: Hematology

## 2019-08-09 ENCOUNTER — Inpatient Hospital Stay (HOSPITAL_BASED_OUTPATIENT_CLINIC_OR_DEPARTMENT_OTHER): Payer: Medicare HMO | Admitting: Hematology

## 2019-08-09 ENCOUNTER — Other Ambulatory Visit: Payer: Self-pay

## 2019-08-09 ENCOUNTER — Telehealth: Payer: Self-pay

## 2019-08-09 VITALS — BP 152/65 | HR 73 | Temp 98.2°F | Resp 16 | Ht 60.0 in | Wt 113.7 lb

## 2019-08-09 DIAGNOSIS — M1380 Other specified arthritis, unspecified site: Secondary | ICD-10-CM | POA: Diagnosis not present

## 2019-08-09 DIAGNOSIS — Z5111 Encounter for antineoplastic chemotherapy: Secondary | ICD-10-CM | POA: Diagnosis not present

## 2019-08-09 DIAGNOSIS — C251 Malignant neoplasm of body of pancreas: Secondary | ICD-10-CM | POA: Diagnosis not present

## 2019-08-09 MED ORDER — PREDNISONE 10 MG PO TABS: 10.0000 mg | ORAL_TABLET | Freq: Every day | ORAL | 0 refills | Status: DC

## 2019-08-09 NOTE — Progress Notes (Signed)
[place]   [phone] Fax:([phone]) [phone]   Clinic Follow up Note   Patient Care Team: Sandi Mariscal, MD as PCP - General (Internal Medicine) Debara Pickett Nadean Corwin, MD as PCP - Cardiology (Cardiology) Jonnie Finner, RN as Oncology Nurse Navigator Alla Feeling, NP as Nurse Practitioner (Nurse Practitioner) Truitt Merle, MD as Consulting Physician (Hematology) Mansouraty, Telford Nab., MD as Consulting Physician (Gastroenterology)  Date of Service:  08/09/2019  CHIEF COMPLAINT: F/u pancreaticcancer and allergic reaction  SUMMARY OF ONCOLOGIC HISTORY: Oncology History Overview Note  Cancer Staging Primary adenocarcinoma of body of pancreas [facility]) Staging form: Exocrine Pancreas, AJCC 8th Edition - Clinical stage from 07/08/2019: Stage III (cT4, cN1, cM0) - Signed by Truitt Merle, MD on 07/13/2019    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[place])  12/21/2018 Imaging   CT AP w contrast IMPRESSION: 1. Large amount of stool at the level of the rectum. Correlate for symptoms of fecal impaction. 2. Persistent dilatation of the distal pancreatic duct with abrupt cutoff at the level of the pancreatic body. Further evaluation with a nonemergent outpatient contrast enhanced MRI is recommended. 3. Nonobstructive left nephrolithiasis. 4. Subacute anterior right rib fractures.   Aortic Atherosclerosis (ICD10-I70.0).   12/24/2018 Imaging   MRCP IMPRESSION: 1. Suspect a 3.5 cm infiltrating pancreatic body neoplasm causing obstruction of the main pancreatic duct and atrophy of the pancreatic tail. The pancreatic head appears normal. 2. Limited examination due to lack of IV contrast and breathing motion artifact. Difficult to assess the vascular structures for invasion. 3. Recommend endoscopic biopsy. A dedicated high-resolution pancreatic CT scan with contrast may be helpful for further evaluation. 4. No obvious metastatic disease.   12/26/2018 Tumor Marker   CA  19-9: 250    05/05/2019 Procedure   EUS by Dr. Paulita Fujita An irregular mass was identified in the pancreatic body. The mass was hypoechoic. The mass measured 24 mm by 17 mm in maximal cross-sectional diameter. The endosonographic borders were poorlydefined. The remainder of the pancreas was examined. The endosonographic appearance of parenchyma and the upstream pancreatic duct indicated duct dilation. Appeared to be tissue ingrowth into portions of the dilated pancreatic duct. Fine needle aspiration for cytology was performed.  A few abnormal lymph nodes were visualized in the peripancreatic region. The nodes were oval, hypoechoic and had well defined margins. IMPRESSION - A mass was identified in the pancreatic body. This was staged T3 N1 Mx byendosonographic criteria. Fine needle aspiration performed. 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 - A few abnormal lymph nodes were visualized in the peripancreatic region. - Pancreatic parenchymal abnormalities consisting of edema and stranding were noted in the genu of the pancreas and pancreatic body. - There was no evidence of significant pathology.   05/29/2019 Imaging   CT CAP w contrast IMPRESSION: 1. Interval increase in size of poorly defined, hypoenhancing, infiltrating tumor arising from the body and proximal tail of pancreas. 2. Progressive vascular involvement of the portal vein, portal venous confluence, splenic vein, and celiac artery. 3. No specific features of solid organ metastases or metastatic disease to the chest. 4. Similar appearance of tiny, upper lung zone predominant solid and ground-glass attenuating nodules which may be postinflammatory/infectious in etiology. 5. Aortic atherosclerosis, in addition to 3 vessel coronary artery disease. Please note that although the presence of coronary artery calcium documents the presence of coronary artery disease, the severity of this disease and any  potential stenosis cannot be assessed on this non-gated CT examination.  Assessment for potential risk factor modification, dietary therapy or pharmacologic therapy may be warranted, if clinically indicated.   Aortic Atherosclerosis (ICD10-I70.0).   07/08/2019 Procedure   Repeat EUS for biopsy and fiducial placement by Dr. Ardis Hughs   1. Irregularly shaped, heterogeneous, hypoechoic mass was noted in the body of the pancreas, measuring 2.7cm across. The mass clearly invades the portal vein and obstructs the main pancreatic duct causing upstream dilation (5-78mm in the body and tail). Preliminary cytology review was positive for malignancy (adenocarcinoma) and then 4 gold fiducials were placed into the mass using the [employer] placement system. 2. CBD was normal, non-dilated. 3. Limited views of the liver, spleen were normal.      07/08/2019 Pathology Results   FINAL MICROSCOPIC DIAGNOSIS:  - Malignant cells consistent with adenocarcinoma    07/08/2019 Cancer Staging   Staging form: Exocrine Pancreas, AJCC 8th Edition - Clinical stage from 07/08/2019: Stage III (cT4, cN1, cM0) - Signed by Truitt Merle, MD on 07/13/2019   07/12/2019 Initial Diagnosis   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[place])   07/23/2019 -  Chemotherapy   First-line Gemcitabine and Abraxane 2 weeks on/1 week off starting 07/23/19 with Gemcitabine alone for C1.   07/27/2019 Procedure   PAC placement       CURRENT THERAPY:  First-line Gemcitabine and Abraxane 2 weeks on/1 week off starting 07/23/19 with Gemcitabine alonefor C1.  INTERVAL HISTORY:  Allison Vaughan is here for a follow up. She presents to the clinic with her daughter. She had GCSF Udenyca injection on 7/23 which was 3 days ago. Starting on 7/22 which was 2 days ago she developed diffuse skin welts and rash on face, ear, chest, upper extremity and mildly on her leg. She also has skin bruising at [facility] site which is not new. She notes she has  been taking Benadryl but did not help. The skin itching of her whole body and has kept her up. She notes she has allergy to certain fish. She has only had allergy reaction with skin itching to IV compazine. It was not to this degree.  She notes after completing cycle 1 she experienced nausea. She still has pain which is not controlled. She last saw Dr Nancy Fetter earlier this month. She attributes leaving her last visit sooner than scheduled due to not hearing well. Her daughter notes she plans to continue to come to each visit.     REVIEW OF SYSTEMS:   Constitutional: Denies fevers, chills or abnormal weight loss Eyes: Denies blurriness of vision Ears, nose, mouth, throat, and face: Denies mucositis or sore throat Respiratory: Denies cough, dyspnea or wheezes Cardiovascular: Denies palpitation, chest discomfort or lower extremity swelling Gastrointestinal:  Denies nausea, heartburn or change in bowel habits Skin: (+) Skin welts and rash of face, ears, chest, upper extremity and mildly of LE Lymphatics: Denies new lymphadenopathy or easy bruising Neurological:Denies numbness, tingling or new weaknesses Behavioral/Psych: Mood is stable, no new changes  All other systems were reviewed with the patient and are negative.  MEDICAL HISTORY:  Past Medical History:  Diagnosis Date  . Accidental drug overdose   . Acute encephalopathy   . Acute on chronic respiratory failure with hypoxia ([place]) 03/01/2017  . Acute renal failure ([place]) 04/16/2014  . AKI (acute kidney injury) ([place]) 04/30/2014  . Altered mental status 06/27/2012  . Anxiety   . Atrial fibrillation ([place])   . CAD (coronary artery disease)   . CAP (community acquired pneumonia) 04/18/2014  . Cardiomyopathy (  [place])   . Chronic hypoxemic respiratory failure ([place]) 06/26/2012  . Chronic pain   . COPD exacerbation ([place]) 06/26/2012  . COPD with acute exacerbation ([place]) 03/01/2017  . DEPRESSIVE DISORDER, NOS 03/13/2006   Qualifier: Diagnosis of  By: Damita Dunnings MD,  Phillip Heal    . Edema, peripheral   . Essential hypertension 04/30/2014  . Fall at home, initial encounter 03/01/2017  . GASTROESOPHAGEAL REFLUX, NO ESOPHAGITIS 03/13/2006   Qualifier: Diagnosis of  By: Damita Dunnings MD, Phillip Heal    . HYPERCHOLESTEROLEMIA 03/13/2006   Qualifier: Diagnosis of  By: Damita Dunnings MD, Phillip Heal    . Hyperlipidemia   . HYPERTENSION, BENIGN SYSTEMIC 03/13/2006   Qualifier: Diagnosis of  By: Damita Dunnings MD, Phillip Heal    . Hypokalemia 04/30/2014  . Hyponatremia 04/30/2014  . Hypotension 04/30/2014  . Hypoxemia 06/26/2012  . Hypoxia 03/26/2018  . Irritable bowel syndrome 03/13/2006   Qualifier: Diagnosis of  By: Damita Dunnings MD, Phillip Heal    . Lung nodule 04/16/2014  . MENOPAUSAL SYNDROME 03/13/2006   Qualifier: Diagnosis of  By: Damita Dunnings MD, Phillip Heal    . Mood disorder [facility]) 03/13/2006   Qualifier: Diagnosis of  By: Damita Dunnings MD, Phillip Heal    . 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 Nausea 04/30/2014  . Nausea vomiting and diarrhea 04/16/2014  . OSTEOARTHRITIS, MULTI SITES 03/13/2006   Qualifier: Diagnosis of  By: Damita Dunnings MD, Phillip Heal    . Pulmonary nodule 04/30/2014  . Respiratory failure ([place]) 01/22/2017  . TOBACCO DEPENDENCE 03/13/2006   Qualifier: Diagnosis of  By: Damita Dunnings MD, Phillip Heal    . Weight loss     SURGICAL HISTORY: Past Surgical History:  Procedure Laterality Date  . ABDOMINAL HYSTERECTOMY    . ESOPHAGOGASTRODUODENOSCOPY N/A 07/08/2019   Procedure: ESOPHAGOGASTRODUODENOSCOPY (EGD);  Surgeon: Milus Banister, MD;  Location: [facility];  Service: Endoscopy;  Laterality: N/A;  . ESOPHAGOGASTRODUODENOSCOPY (EGD) WITH PROPOFOL N/A 05/05/2019   Procedure: ESOPHAGOGASTRODUODENOSCOPY (EGD) WITH PROPOFOL;  Surgeon: Arta Silence, MD;  Location: [facility];  Service: Endoscopy;  Laterality: N/A;  . EUS N/A 07/08/2019   Procedure: UPPER ENDOSCOPIC ULTRASOUND (EUS) RADIAL;  Surgeon: Milus Banister, MD;  Location: [facility];  Service: Endoscopy;  Laterality: N/A;  . EYE SURGERY    . FIDUCIAL MARKER PLACEMENT N/A  07/08/2019   Procedure: FIDUCIAL MARKER PLACEMENT;  Surgeon: Milus Banister, MD;  Location: [facility];  Service: Endoscopy;  Laterality: N/A;  . FINE NEEDLE ASPIRATION N/A 05/05/2019   Procedure: FINE NEEDLE ASPIRATION (FNA) LINEAR;  Surgeon: Arta Silence, MD;  Location: [facility];  Service: Endoscopy;  Laterality: N/A;  . FINE NEEDLE ASPIRATION N/A 07/08/2019   Procedure: FINE NEEDLE ASPIRATION (FNA) LINEAR;  Surgeon: Milus Banister, MD;  Location: [facility];  Service: Endoscopy;  Laterality: N/A;  . IR IMAGING GUIDED PORT INSERTION  07/27/2019  . ROTATOR CUFF REPAIR Right 2012  . UPPER ESOPHAGEAL ENDOSCOPIC ULTRASOUND (EUS) N/A 05/05/2019   Procedure: UPPER ESOPHAGEAL ENDOSCOPIC ULTRASOUND (EUS) with FNA;  Surgeon: Arta Silence, MD;  Location: [facility];  Service: Endoscopy;  Laterality: N/A;    I have reviewed the social history and family history with the patient and they are unchanged from previous note.  ALLERGIES:  is allergic to compazine [prochlorperazine edisylate].  MEDICATIONS:  Current Outpatient Medications  Medication Sig Dispense Refill  . albuterol (PROVENTIL HFA;VENTOLIN HFA) 108 (90 BASE) MCG/ACT inhaler Inhale 2 puffs into the lungs every 6 (six) hours as needed for wheezing. 1 Inhaler 2  . albuterol (PROVENTIL) (2.5 MG/3ML) 0.083% nebulizer solution Take  3 mLs (2.5 mg total) by nebulization every 2 (two) hours as needed for wheezing. 75 mL 12  . Aspirin-Salicylamide-Caffeine (BC HEADACHE PO) Take 1 packet by mouth 2 (two) times daily as needed (headaches).     Marland Kitchen atorvastatin (LIPITOR) 10 MG tablet Take 1 tablet (10 mg total) by mouth at bedtime. (Patient taking differently: Take 10 mg by mouth every evening. ) 30 tablet 0  . FLUoxetine (PROZAC) 40 MG capsule Take 40 mg by mouth every evening.     . lidocaine-prilocaine (EMLA) cream Apply to affected area as needed weekly 30 g 3  . LORazepam (ATIVAN) 0.5 MG tablet Take 0.5 mg by mouth at bedtime.     .  naproxen sodium (ALEVE) 220 MG tablet Take 220 mg by mouth daily as needed (headache).    . Nutritional Supplements (ENSURE ENLIVE PO) Take 237 mLs by mouth daily as needed (when want too).     Marland Kitchen omeprazole (PRILOSEC) 40 MG capsule Take 40 mg by mouth every evening.     . ondansetron (ZOFRAN ODT) 4 MG disintegrating tablet Take 1 tablet (4 mg total) by mouth every 6 (six) hours as needed for nausea or vomiting. 30 tablet 0  . ondansetron (ZOFRAN) 8 MG tablet Take 1 tablet (8 mg total) by mouth 2 (two) times daily as needed (Nausea or vomiting). 30 tablet 1  . oxyCODONE 10 MG TABS Take 1 tablet (10 mg total) by mouth every 6 (six) hours as needed for severe pain. 20 tablet 0  . potassium chloride (KLOR-CON) 10 MEQ tablet Take 10 mEq by mouth every evening.     . prochlorperazine (COMPAZINE) 10 MG tablet Take 1 tablet (10 mg total) by mouth every 6 (six) hours as needed (Nausea or vomiting). 30 tablet 1  . umeclidinium bromide (INCRUSE ELLIPTA) 62.5 MCG/INH AEPB Inhale 1 puff into the lungs daily. 30 each 0  . apixaban (ELIQUIS) 5 MG TABS tablet Take 1 tablet (5 mg total) by mouth 2 (two) times daily. (Patient taking differently: Take 5 mg by mouth daily. ) 180 tablet 3  . losartan (COZAAR) 25 MG tablet Take 1 tablet (25 mg total) by mouth daily. (Patient taking differently: Take 25 mg by mouth every evening. ) 90 tablet 3  . metoprolol succinate (TOPROL-XL) 25 MG 24 hr tablet Take 2 tablets (50 mg total) by mouth daily. Take with or immediately following a meal. (Patient taking differently: Take 50 mg by mouth every evening. Take with or immediately following a meal.) 90 tablet 3  . nitroGLYCERIN (NITROSTAT) 0.4 MG SL tablet Place 1 tablet (0.4 mg total) under the tongue every 5 (five) minutes as needed for chest pain. 90 tablet 3  . predniSONE (DELTASONE) 10 MG tablet Take 1 tablet (10 mg total) by mouth daily with breakfast. 14 tablet 0   No current facility-administered medications for this visit.     PHYSICAL EXAMINATION: ECOG PERFORMANCE STATUS: 2 - Symptomatic, <50% confined to bed  Vitals:   08/09/19 1552  BP: (!) 152/65  Pulse: 73  Resp: 16  Temp: 98.2 F (36.8 C)   Filed Weights   08/09/19 1552  Weight: 113 lb 11.2 oz (51.6 kg)    GENERAL:alert, no distress and comfortable SKIN: skin color, texture, turgor are normal (+) Skin welts and rash of face, ears, chest, upper extremity and mildly of LE EYES: normal, Conjunctiva are pink and non-injected, sclera clear  NECK: supple, thyroid normal size, non-tender, without nodularity LYMPH:  no palpable lymphadenopathy in  the cervical, axillary  LUNGS: clear to auscultation and percussion (+) Mild wheezing of lungs from COPD.  HEART: regular rate & rhythm and no murmurs and no lower extremity edema ABDOMEN:abdomen soft, non-tender and normal bowel sounds Musculoskeletal:no cyanosis of digits and no clubbing  NEURO: alert & oriented x 3 with fluent speech, no focal motor/sensory deficits  LABORATORY DATA:  I have reviewed the data as listed CBC Latest Ref Rng & Units 08/06/2019 07/30/2019 07/27/2019  WBC 4.0 - 10.5 K/uL 1.9(L) 2.0(L) 3.5(L)  Hemoglobin 12.0 - 15.0 g/dL 10.2(L) 10.2(L) 10.0(L)  Hematocrit 36 - 46 % 30.7(L) 30.8(L) 31.1(L)  Platelets 150 - 400 K/uL 54(L) 143(L) 175     CMP Latest Ref Rng & Units 08/06/2019 07/30/2019 07/23/2019  Glucose 70 - 99 mg/dL 93 118(H) 86  BUN 8 - 23 mg/dL 12 10 11   Creatinine 0.44 - 1.00 mg/dL 1.12(H) 0.99 1.21(H)  Sodium 135 - 145 mmol/L 140 136 135  Potassium 3.5 - 5.1 mmol/L 4.4 3.8 4.5  Chloride 98 - 111 mmol/L 106 101 101  CO2 22 - 32 mmol/L 21(L) 26 22  Calcium 8.9 - 10.3 mg/dL 8.7(L) 8.9 9.0  Total Protein 6.5 - 8.1 g/dL 6.1(L) 6.3(L) 7.0  Total Bilirubin 0.3 - 1.2 mg/dL 0.4 0.3 0.4  Alkaline Phos 38 - 126 U/L 133(H) 126 137(H)  AST 15 - 41 U/L 15 12(L) 16  ALT 0 - 44 U/L <6 <6 <6      RADIOGRAPHIC STUDIES: I have personally reviewed the radiological images as  listed and agreed with the findings in the report. No results found.   ASSESSMENT & PLAN:  Allison Vaughan is a 74 y.o. female with    1. Allergic Reaction  -She had GCSF Udenyca injection on 7/23 which was 3 days ago. Starting on 7/24 which was 2 days ago she developed diffuse skin erythema and rash which has progressed since on face, ear, chest, upper extremity and mildly on her leg.  -She tried Benadryl but did not help. The skin itching of her whole body and has kept her up at night.  -She notes she has allergy to certain fish. She has only had allergy reaction with skin itching to IV compazine, but not to this degree.  -I will call in tapering Prednisone tapering dose over 5 days from 40mg  (08/09/19). I recommend she take Prilosec.  -If this does not improve or worsens she should contact our clinic. If she becomes SOB she should go to hospital.    2. Pancreatic neoplasm,cT4N1M0,unresectable -Sheinitiallypresented with gradual weight loss and abdominal discomfort with low back pain for 6-12 months, CA 19-9 is elevated to 250. 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  -HerEUS/biopsyfrom 07/08/19 showed malignant cells consistent with adenocarcinoma. We discussed the general clinical course for pancreatic adenocarcinoma. Due to vascular invasion, she is not a surgical candidatethus her cancer is no longer curable but still treatable to control disease and prolong her life.  -I started her on systemicmoderate intensitychemotherapy with gemcitabine/Abraxane(withsingle agentgemcitabine for cycle 1)on days 1 and 8 q21 days beginning 07/23/19,followed by SBRT. -Her PAC was placed 07/27/19 -If she continues to tolerate Gemcitabine alone, will add Abraxane and dose reduce both starting with C2. Will likely only do chemo treatment for 3-4 months.   -S/p Cycle 1 she moderately tolerated with nausea with moderate neutropenia. 08/06/19 ANC 0.8, WBC 1.9, Hg 10.2.    3.  Abdominal bloating, weight loss, low back pain -stable   4.Comorbidities:COPD,Anxiety and depression,Afib, CAD, HTN, Insomnia     PLAN: -I called in 10mg  Prednisone taper dose, starting at 40mg  daily over 5 days. She will continue benadryl  -I will stop Udenyca and change to Granix 416mcg on day 3 and 10 -Lab, flush, F/u and Gem/Abraxane on 8/6 and 8/13 with dose reduction    No problem-specific Assessment & Plan notes found for this encounter.   No orders of the defined types were placed in this encounter.  All questions were answered. The patient knows to call the clinic with any problems, questions or concerns. No barriers to learning was detected. The total time spent in the appointment was 25 minutes.     Truitt Merle, MD 08/09/2019   I, Joslyn Devon, am acting as scribe for Truitt Merle, MD.   I have reviewed the above documentation for accuracy and completeness, and I agree with the above.

## 2019-08-09 NOTE — Telephone Encounter (Signed)
Allison Vaughan's daughter [name] called stating her mother had a shot [place]) on Friday and the next day developed itching, red ears, red and puffy face.  Her arm is very sore at the injection site.  She is not having and difficulty breathing.  She has been taking benadryl with no relieve.  I discussed with Dr. Burr Medico.  Appt made for this afternoon at 1540.  I asked the daughter to please come with her mother.  She verbalized understanding.

## 2019-08-10 ENCOUNTER — Telehealth: Payer: Self-pay | Admitting: Pharmacist

## 2019-08-10 NOTE — Telephone Encounter (Signed)
Patient had a skin rash reaction to Udenyca. MD would like to change to short acting G-CSF. Orders placed for Nivestym on D3 and D9 as requested per MD.Udenyca discontinued.  Confirmed Nivestym is the preferred brand with PA team.  Hardie Pulley, PharmD, BCPS, BCOP

## 2019-08-11 ENCOUNTER — Telehealth: Payer: Self-pay | Admitting: Hematology

## 2019-08-11 NOTE — Telephone Encounter (Signed)
No 7/26 los 

## 2019-08-13 ENCOUNTER — Inpatient Hospital Stay (HOSPITAL_COMMUNITY)
Admission: EM | Admit: 2019-08-13 | Discharge: 2019-08-15 | DRG: 291 | Disposition: A | Discharge status: Home / Self Care | Payer: Medicare HMO | Attending: Internal Medicine | Admitting: Internal Medicine

## 2019-08-13 ENCOUNTER — Other Ambulatory Visit: Payer: Self-pay

## 2019-08-13 ENCOUNTER — Emergency Department (HOSPITAL_COMMUNITY): Payer: Medicare HMO

## 2019-08-13 ENCOUNTER — Encounter (HOSPITAL_COMMUNITY): Payer: Self-pay

## 2019-08-13 ENCOUNTER — Inpatient Hospital Stay (HOSPITAL_COMMUNITY): Payer: Medicare HMO

## 2019-08-13 DIAGNOSIS — Z7901 Long term (current) use of anticoagulants: Secondary | ICD-10-CM | POA: Diagnosis not present

## 2019-08-13 DIAGNOSIS — K589 Irritable bowel syndrome without diarrhea: Secondary | ICD-10-CM | POA: Diagnosis present

## 2019-08-13 DIAGNOSIS — D72829 Elevated white blood cell count, unspecified: Secondary | ICD-10-CM | POA: Diagnosis present

## 2019-08-13 DIAGNOSIS — Z8701 Personal history of pneumonia (recurrent): Secondary | ICD-10-CM

## 2019-08-13 DIAGNOSIS — M1990 Unspecified osteoarthritis, unspecified site: Secondary | ICD-10-CM | POA: Diagnosis present

## 2019-08-13 DIAGNOSIS — T380X5A Adverse effect of glucocorticoids and synthetic analogues, initial encounter: Secondary | ICD-10-CM | POA: Diagnosis present

## 2019-08-13 DIAGNOSIS — F17210 Nicotine dependence, cigarettes, uncomplicated: Secondary | ICD-10-CM | POA: Diagnosis present

## 2019-08-13 DIAGNOSIS — F419 Anxiety disorder, unspecified: Secondary | ICD-10-CM | POA: Diagnosis present

## 2019-08-13 DIAGNOSIS — I5033 Acute on chronic diastolic (congestive) heart failure: Secondary | ICD-10-CM | POA: Diagnosis present

## 2019-08-13 DIAGNOSIS — J9621 Acute and chronic respiratory failure with hypoxia: Secondary | ICD-10-CM | POA: Diagnosis present

## 2019-08-13 DIAGNOSIS — I2510 Atherosclerotic heart disease of native coronary artery without angina pectoris: Secondary | ICD-10-CM | POA: Diagnosis present

## 2019-08-13 DIAGNOSIS — I130 Hypertensive heart and chronic kidney disease with heart failure and stage 1 through stage 4 chronic kidney disease, or unspecified chronic kidney disease: Principal | ICD-10-CM | POA: Diagnosis present

## 2019-08-13 DIAGNOSIS — I5031 Acute diastolic (congestive) heart failure: Secondary | ICD-10-CM | POA: Diagnosis not present

## 2019-08-13 DIAGNOSIS — J9601 Acute respiratory failure with hypoxia: Secondary | ICD-10-CM | POA: Diagnosis not present

## 2019-08-13 DIAGNOSIS — Z79899 Other long term (current) drug therapy: Secondary | ICD-10-CM | POA: Diagnosis not present

## 2019-08-13 DIAGNOSIS — I429 Cardiomyopathy, unspecified: Secondary | ICD-10-CM | POA: Diagnosis present

## 2019-08-13 DIAGNOSIS — I160 Hypertensive urgency: Secondary | ICD-10-CM

## 2019-08-13 DIAGNOSIS — F329 Major depressive disorder, single episode, unspecified: Secondary | ICD-10-CM | POA: Diagnosis present

## 2019-08-13 DIAGNOSIS — T45625A Adverse effect of hemostatic drug, initial encounter: Secondary | ICD-10-CM | POA: Diagnosis present

## 2019-08-13 DIAGNOSIS — J9602 Acute respiratory failure with hypercapnia: Secondary | ICD-10-CM

## 2019-08-13 DIAGNOSIS — J9691 Respiratory failure, unspecified with hypoxia: Secondary | ICD-10-CM

## 2019-08-13 DIAGNOSIS — Z7952 Long term (current) use of systemic steroids: Secondary | ICD-10-CM

## 2019-08-13 DIAGNOSIS — J441 Chronic obstructive pulmonary disease with (acute) exacerbation: Secondary | ICD-10-CM | POA: Diagnosis not present

## 2019-08-13 DIAGNOSIS — I4891 Unspecified atrial fibrillation: Secondary | ICD-10-CM | POA: Diagnosis present

## 2019-08-13 DIAGNOSIS — Z20822 Contact with and (suspected) exposure to covid-19: Secondary | ICD-10-CM | POA: Diagnosis present

## 2019-08-13 DIAGNOSIS — N1831 Chronic kidney disease, stage 3a: Secondary | ICD-10-CM | POA: Diagnosis present

## 2019-08-13 DIAGNOSIS — C259 Malignant neoplasm of pancreas, unspecified: Secondary | ICD-10-CM | POA: Diagnosis present

## 2019-08-13 DIAGNOSIS — I509 Heart failure, unspecified: Secondary | ICD-10-CM

## 2019-08-13 DIAGNOSIS — E785 Hyperlipidemia, unspecified: Secondary | ICD-10-CM | POA: Diagnosis present

## 2019-08-13 DIAGNOSIS — Z8249 Family history of ischemic heart disease and other diseases of the circulatory system: Secondary | ICD-10-CM

## 2019-08-13 DIAGNOSIS — Z7951 Long term (current) use of inhaled steroids: Secondary | ICD-10-CM

## 2019-08-13 LAB — I-STAT VENOUS BLOOD GAS, ED
Acid-base deficit: 2 mmol/L (ref 0.0–2.0)
Bicarbonate: 23.9 mmol/L (ref 20.0–28.0)
Calcium, Ion: 1.12 mmol/L — ABNORMAL LOW (ref 1.15–1.40)
HCT: 30 % — ABNORMAL LOW (ref 36.0–46.0)
Hemoglobin: 10.2 g/dL — ABNORMAL LOW (ref 12.0–15.0)
O2 Saturation: 95 %
Potassium: 4.1 mmol/L (ref 3.5–5.1)
Sodium: 140 mmol/L (ref 135–145)
TCO2: 25 mmol/L (ref 22–32)
pCO2, Ven: 42.5 mmHg — ABNORMAL LOW (ref 44.0–60.0)
pH, Ven: 7.357 (ref 7.250–7.430)
pO2, Ven: 79 mmHg — ABNORMAL HIGH (ref 32.0–45.0)

## 2019-08-13 LAB — URINALYSIS, ROUTINE W REFLEX MICROSCOPIC
Bilirubin Urine: NEGATIVE
Glucose, UA: NEGATIVE mg/dL
Hgb urine dipstick: NEGATIVE
Ketones, ur: NEGATIVE mg/dL
Leukocytes,Ua: NEGATIVE
Nitrite: NEGATIVE
Protein, ur: NEGATIVE mg/dL
Specific Gravity, Urine: 1.008 (ref 1.005–1.030)
pH: 6 (ref 5.0–8.0)

## 2019-08-13 LAB — LACTIC ACID, PLASMA: Lactic Acid, Venous: 1.7 mmol/L (ref 0.5–1.9)

## 2019-08-13 LAB — CBC WITH DIFFERENTIAL/PLATELET
Abs Immature Granulocytes: 7.92 10*3/uL — ABNORMAL HIGH (ref 0.00–0.07)
Basophils Absolute: 0 10*3/uL (ref 0.0–0.1)
Basophils Relative: 0 %
Eosinophils Absolute: 0.1 10*3/uL (ref 0.0–0.5)
Eosinophils Relative: 0 %
HCT: 30.4 % — ABNORMAL LOW (ref 36.0–46.0)
Hemoglobin: 9.3 g/dL — ABNORMAL LOW (ref 12.0–15.0)
Immature Granulocytes: 19 %
Lymphocytes Relative: 5 %
Lymphs Abs: 2.3 10*3/uL (ref 0.7–4.0)
MCH: 28.4 pg (ref 26.0–34.0)
MCHC: 30.6 g/dL (ref 30.0–36.0)
MCV: 93 fL (ref 80.0–100.0)
Monocytes Absolute: 2.6 10*3/uL — ABNORMAL HIGH (ref 0.1–1.0)
Monocytes Relative: 6 %
Neutro Abs: 28.8 10*3/uL — ABNORMAL HIGH (ref 1.7–7.7)
Neutrophils Relative %: 70 %
Platelets: 145 10*3/uL — ABNORMAL LOW (ref 150–400)
RBC: 3.27 MIL/uL — ABNORMAL LOW (ref 3.87–5.11)
RDW: 17.9 % — ABNORMAL HIGH (ref 11.5–15.5)
WBC: 41.7 10*3/uL — ABNORMAL HIGH (ref 4.0–10.5)
nRBC: 0.5 % — ABNORMAL HIGH (ref 0.0–0.2)

## 2019-08-13 LAB — BASIC METABOLIC PANEL
Anion gap: 11 (ref 5–15)
BUN: 18 mg/dL (ref 8–23)
CO2: 22 mmol/L (ref 22–32)
Calcium: 8.5 mg/dL — ABNORMAL LOW (ref 8.9–10.3)
Chloride: 104 mmol/L (ref 98–111)
Creatinine, Ser: 1.36 mg/dL — ABNORMAL HIGH (ref 0.44–1.00)
GFR calc Af Amer: 44 mL/min — ABNORMAL LOW (ref >60)
GFR calc non Af Amer: 38 mL/min — ABNORMAL LOW (ref >60)
Glucose, Bld: 112 mg/dL — ABNORMAL HIGH (ref 70–99)
Potassium: 4 mmol/L (ref 3.5–5.1)
Sodium: 137 mmol/L (ref 135–145)

## 2019-08-13 LAB — ECHOCARDIOGRAM COMPLETE
Area-P 1/2: 5.27 cm2
Height: 60 in
S' Lateral: 3.6 cm
Weight: 1808 oz

## 2019-08-13 LAB — HEPATIC FUNCTION PANEL
ALT: 18 U/L (ref 0–44)
AST: 37 U/L (ref 15–41)
Albumin: 3.5 g/dL (ref 3.5–5.0)
Alkaline Phosphatase: 238 U/L — ABNORMAL HIGH (ref 38–126)
Bilirubin, Direct: <0.1 mg/dL (ref 0.0–0.2)
Total Bilirubin: 0.7 mg/dL (ref 0.3–1.2)
Total Protein: 6.2 g/dL — ABNORMAL LOW (ref 6.5–8.1)

## 2019-08-13 LAB — APTT: aPTT: 26 seconds (ref 24–36)

## 2019-08-13 LAB — BRAIN NATRIURETIC PEPTIDE: B Natriuretic Peptide: 1114 pg/mL — ABNORMAL HIGH (ref 0.0–100.0)

## 2019-08-13 LAB — TROPONIN I (HIGH SENSITIVITY)
Troponin I (High Sensitivity): 43 ng/L — ABNORMAL HIGH (ref <18)
Troponin I (High Sensitivity): 44 ng/L — ABNORMAL HIGH (ref <18)

## 2019-08-13 LAB — PROTIME-INR
INR: 1 (ref 0.8–1.2)
Prothrombin Time: 13.1 seconds (ref 11.4–15.2)

## 2019-08-13 LAB — SARS CORONAVIRUS 2 BY RT PCR (HOSPITAL ORDER, PERFORMED IN ~~LOC~~ HOSPITAL LAB): SARS Coronavirus 2: NEGATIVE

## 2019-08-13 MED ORDER — LACTATED RINGERS IV SOLN: INTRAVENOUS | Status: DC

## 2019-08-13 MED ORDER — SODIUM CHLORIDE 0.9 % IV SOLN
2.0000 g | INTRAVENOUS | Status: DC
  Administered 2019-08-14 – 2019-08-15 (×2): 2 g via INTRAVENOUS
  Filled 2019-08-13 (×2): qty 2

## 2019-08-13 MED ORDER — OXYCODONE HCL 5 MG PO TABS
10.0000 mg | ORAL_TABLET | Freq: Four times a day (QID) | ORAL | Status: DC | PRN
  Administered 2019-08-13 – 2019-08-14 (×2): 10 mg via ORAL
  Filled 2019-08-13 (×2): qty 2

## 2019-08-13 MED ORDER — FUROSEMIDE 10 MG/ML IJ SOLN
40.0000 mg | Freq: Two times a day (BID) | INTRAMUSCULAR | Status: DC
  Administered 2019-08-13: 40 mg via INTRAVENOUS
  Filled 2019-08-13: qty 4

## 2019-08-13 MED ORDER — IPRATROPIUM-ALBUTEROL 0.5-2.5 (3) MG/3ML IN SOLN: 3.0000 mL | Freq: Four times a day (QID) | RESPIRATORY_TRACT | Status: DC | PRN

## 2019-08-13 MED ORDER — APIXABAN 5 MG PO TABS
5.0000 mg | ORAL_TABLET | Freq: Two times a day (BID) | ORAL | Status: DC
  Administered 2019-08-13 – 2019-08-15 (×4): 5 mg via ORAL
  Filled 2019-08-13 (×4): qty 1

## 2019-08-13 MED ORDER — ACETAMINOPHEN 650 MG RE SUPP: 650.0000 mg | Freq: Four times a day (QID) | RECTAL | Status: DC | PRN

## 2019-08-13 MED ORDER — ALBUTEROL SULFATE (2.5 MG/3ML) 0.083% IN NEBU: 2.5000 mg | INHALATION_SOLUTION | Freq: Once | RESPIRATORY_TRACT | Status: DC

## 2019-08-13 MED ORDER — VANCOMYCIN HCL 500 MG/100ML IV SOLN: 500.0000 mg | INTRAVENOUS | Status: DC

## 2019-08-13 MED ORDER — METHYLPREDNISOLONE SODIUM SUCC 125 MG IJ SOLR
60.0000 mg | Freq: Four times a day (QID) | INTRAMUSCULAR | Status: AC
  Administered 2019-08-13 – 2019-08-14 (×4): 60 mg via INTRAVENOUS
  Filled 2019-08-13 (×4): qty 2

## 2019-08-13 MED ORDER — FUROSEMIDE 10 MG/ML IJ SOLN
40.0000 mg | Freq: Once | INTRAMUSCULAR | Status: AC
  Administered 2019-08-13: 40 mg via INTRAVENOUS
  Filled 2019-08-13: qty 4

## 2019-08-13 MED ORDER — MORPHINE SULFATE (PF) 4 MG/ML IV SOLN
4.0000 mg | Freq: Once | INTRAVENOUS | Status: AC
  Administered 2019-08-13: 4 mg via INTRAVENOUS
  Filled 2019-08-13: qty 1

## 2019-08-13 MED ORDER — VANCOMYCIN HCL IN DEXTROSE 1-5 GM/200ML-% IV SOLN
1000.0000 mg | Freq: Once | INTRAVENOUS | Status: AC
  Administered 2019-08-13: 1000 mg via INTRAVENOUS
  Filled 2019-08-13: qty 200

## 2019-08-13 MED ORDER — SODIUM CHLORIDE 0.9 % IV SOLN: 2.0000 g | INTRAVENOUS | Status: DC

## 2019-08-13 MED ORDER — SODIUM CHLORIDE 0.9 % IV SOLN
2.0000 g | Freq: Once | INTRAVENOUS | Status: AC
  Administered 2019-08-13: 2 g via INTRAVENOUS
  Filled 2019-08-13: qty 2

## 2019-08-13 MED ORDER — TRAZODONE HCL 100 MG PO TABS
100.0000 mg | ORAL_TABLET | Freq: Once | ORAL | Status: AC
  Administered 2019-08-13: 100 mg via ORAL
  Filled 2019-08-13: qty 1

## 2019-08-13 MED ORDER — ALBUTEROL SULFATE (2.5 MG/3ML) 0.083% IN NEBU
2.5000 mg | INHALATION_SOLUTION | RESPIRATORY_TRACT | Status: DC | PRN
  Administered 2019-08-14: 2.5 mg via RESPIRATORY_TRACT
  Filled 2019-08-13: qty 3

## 2019-08-13 MED ORDER — FLUOXETINE HCL 40 MG PO CAPS: 40.0000 mg | ORAL_CAPSULE | Freq: Every evening | ORAL | Status: DC

## 2019-08-13 MED ORDER — PROCHLORPERAZINE MALEATE 10 MG PO TABS
10.0000 mg | ORAL_TABLET | Freq: Four times a day (QID) | ORAL | Status: DC | PRN
  Filled 2019-08-13: qty 1

## 2019-08-13 MED ORDER — ACETAMINOPHEN 325 MG PO TABS
650.0000 mg | ORAL_TABLET | Freq: Four times a day (QID) | ORAL | Status: DC | PRN
  Administered 2019-08-14 – 2019-08-15 (×2): 650 mg via ORAL
  Filled 2019-08-13 (×2): qty 2

## 2019-08-13 MED ORDER — METRONIDAZOLE IN NACL 5-0.79 MG/ML-% IV SOLN
500.0000 mg | Freq: Once | INTRAVENOUS | Status: AC
  Administered 2019-08-13: 500 mg via INTRAVENOUS
  Filled 2019-08-13: qty 100

## 2019-08-13 MED ORDER — LORAZEPAM 0.5 MG PO TABS
0.5000 mg | ORAL_TABLET | Freq: Every day | ORAL | Status: DC
  Administered 2019-08-13: 0.5 mg via ORAL
  Filled 2019-08-13: qty 1

## 2019-08-13 MED ORDER — DOXYCYCLINE HYCLATE 100 MG PO TABS
100.0000 mg | ORAL_TABLET | Freq: Two times a day (BID) | ORAL | Status: DC
  Administered 2019-08-13 – 2019-08-15 (×4): 100 mg via ORAL
  Filled 2019-08-13 (×4): qty 1

## 2019-08-13 MED ORDER — SODIUM CHLORIDE 0.9% FLUSH
3.0000 mL | Freq: Two times a day (BID) | INTRAVENOUS | Status: DC
  Administered 2019-08-13 – 2019-08-15 (×4): 3 mL via INTRAVENOUS

## 2019-08-13 MED ORDER — HYDRALAZINE HCL 20 MG/ML IJ SOLN: 10.0000 mg | Freq: Four times a day (QID) | INTRAMUSCULAR | Status: DC | PRN

## 2019-08-13 MED ORDER — ARFORMOTEROL TARTRATE 15 MCG/2ML IN NEBU
15.0000 ug | INHALATION_SOLUTION | Freq: Two times a day (BID) | RESPIRATORY_TRACT | Status: DC
  Administered 2019-08-13 – 2019-08-15 (×4): 15 ug via RESPIRATORY_TRACT
  Filled 2019-08-13 (×4): qty 2

## 2019-08-13 MED ORDER — HYDRALAZINE HCL 20 MG/ML IJ SOLN
10.0000 mg | Freq: Once | INTRAMUSCULAR | Status: AC
  Administered 2019-08-13: 10 mg via INTRAVENOUS
  Filled 2019-08-13: qty 1

## 2019-08-13 MED ORDER — PREDNISONE 20 MG PO TABS: 40.0000 mg | ORAL_TABLET | Freq: Every day | ORAL | Status: DC

## 2019-08-13 MED ORDER — METOPROLOL SUCCINATE ER 50 MG PO TB24
50.0000 mg | ORAL_TABLET | Freq: Every day | ORAL | Status: DC
  Administered 2019-08-14 – 2019-08-15 (×2): 50 mg via ORAL
  Filled 2019-08-13 (×2): qty 1

## 2019-08-13 MED ORDER — ATORVASTATIN CALCIUM 10 MG PO TABS
10.0000 mg | ORAL_TABLET | Freq: Every day | ORAL | Status: DC
  Administered 2019-08-13 – 2019-08-14 (×2): 10 mg via ORAL
  Filled 2019-08-13 (×2): qty 1

## 2019-08-13 MED ORDER — ALBUTEROL SULFATE HFA 108 (90 BASE) MCG/ACT IN AERS
6.0000 | INHALATION_SPRAY | Freq: Once | RESPIRATORY_TRACT | Status: AC
  Administered 2019-08-13: 6 via RESPIRATORY_TRACT
  Filled 2019-08-13: qty 6.7

## 2019-08-13 NOTE — Progress Notes (Signed)
Allison Vaughan   DOB:01/07/1946   OH#:[id number]   IRS#:[id number]  Oncology follow up   Subjective: Patient is well-known to me, under my care for pancreatic cancer.  Tolerated chemotherapy with single agent gemcitabine about 2 weeks ago.  He presented to hospital for worsening dyspnea, and was admitted for hypoxic respite failure.  I saw her in the ED, she does feel much better after diuretics and breathing treatment.   Objective:  Vitals:   08/13/19 2026 08/13/19 2100  BP:  (!) 159/84  Pulse:  79  Resp:  18  Temp:  97.8 F (36.6 C)  SpO2: 97% 98%    Body mass index is 22.07 kg/m.  Intake/Output Summary (Last 24 hours) at 08/13/2019 2149 Last data filed at 08/13/2019 1531 Gross per 24 hour  Intake 79.91 ml  Output 1200 ml  Net -1120.09 ml     Sclerae unicteric  Oropharynx clear  No peripheral adenopathy  Lungs (+) bilateral scattering wheezing and rales on bilateral lung bases  Heart regular rate and rhythm  Abdomen benign, mild mid abdomen tenderness   MSK no focal spinal tenderness, no peripheral edema  Neuro nonfocal    CBG (last 3)  No results for input(s): GLUCAP in the last 72 hours.   Labs:  Urine Studies No results for input(s): UHGB, CRYS in the last 72 hours.  Invalid input(s): UACOL, UAPR, USPG, UPH, UTP, UGL, UKET, UBIL, UNIT, UROB, ULEU, UEPI, UWBC, URBC, UBAC, CAST, UCOM, BILUA  Basic Metabolic Panel: Recent Labs  Lab 08/13/19 0834 08/13/19 0844  NA 137 140  K 4.0 4.1  CL 104  --   CO2 22  --   GLUCOSE 112*  --   BUN 18  --   CREATININE 1.36*  --   CALCIUM 8.5*  --    GFR Estimated Creatinine Clearance: 25.3 mL/min (A) (by C-G formula based on SCr of 1.36 mg/dL (H)). Liver Function Tests: Recent Labs  Lab 08/13/19 1054  AST 37  ALT 18  ALKPHOS 238*  BILITOT 0.7  PROT 6.2*  ALBUMIN 3.5   No results for input(s): LIPASE, AMYLASE in the last 168 hours. No results for input(s): AMMONIA in the last 168 hours. Coagulation  profile Recent Labs  Lab 08/13/19 1054  INR 1.0    CBC: Recent Labs  Lab 08/13/19 0834 08/13/19 0844  WBC 41.7*  --   NEUTROABS 28.8*  --   HGB 9.3* 10.2*  HCT 30.4* 30.0*  MCV 93.0  --   PLT 145*  --    Cardiac Enzymes: No results for input(s): CKTOTAL, CKMB, CKMBINDEX, TROPONINI in the last 168 hours. BNP: Invalid input(s): POCBNP CBG: No results for input(s): GLUCAP in the last 168 hours. D-Dimer No results for input(s): DDIMER in the last 72 hours. Hgb A1c No results for input(s): HGBA1C in the last 72 hours. Lipid Profile No results for input(s): CHOL, HDL, LDLCALC, TRIG, CHOLHDL, LDLDIRECT in the last 72 hours. Thyroid function studies No results for input(s): TSH, T4TOTAL, T3FREE, THYROIDAB in the last 72 hours.  Invalid input(s): FREET3 Anemia work up No results for input(s): VITAMINB12, FOLATE, FERRITIN, TIBC, IRON, RETICCTPCT in the last 72 hours. Microbiology Recent Results (from the past 240 hour(s))  SARS Coronavirus 2 by RT PCR (hospital order, performed in [facility] hospital lab) Nasopharyngeal Nasopharyngeal Swab     Status: None   Collection Time: 08/13/19  8:36 AM   Specimen: Nasopharyngeal Swab  Result Value Ref Range Status   SARS Coronavirus  2 NEGATIVE NEGATIVE Final    Comment: (NOTE) SARS-CoV-2 target nucleic acids are NOT DETECTED.  The SARS-CoV-2 RNA is generally detectable in upper and lower respiratory specimens during the acute phase of infection. The lowest concentration of SARS-CoV-2 viral copies this assay can detect is 250 copies / mL. A negative result does not preclude SARS-CoV-2 infection and should not be used as the sole basis for treatment or other patient management decisions.  A negative result may occur with improper specimen collection / handling, submission of specimen other than nasopharyngeal swab, presence of viral mutation(s) within the areas targeted by this assay, and inadequate number of viral copies (<250  copies / mL). A negative result must be combined with clinical observations, patient history, and epidemiological information.  Fact Sheet for Patients:   StrictlyIdeas.no  Fact Sheet for Healthcare Providers: BankingDealers.co.za  This test is not yet approved or  cleared by the [place] FDA and has been authorized for detection and/or diagnosis of SARS-CoV-2 by FDA under an Emergency Use Authorization (EUA).  This EUA will remain in effect (meaning this test can be used) for the duration of the COVID-19 declaration under Section 564(b)(1) of the Act, 21 U.S.C. section 360bbb-3(b)(1), unless the authorization is terminated or revoked sooner.  Performed at [facility], [place] [address] [place], [place] [address]       Studies:  DG Chest 2 View  Result Date: 08/13/2019 CLINICAL DATA:  Shortness of breath EXAM: CHEST - 2 VIEW COMPARISON:  07/10/2019 FINDINGS: Right Port-A-Cath in place with the tip at the cavoatrial junction. Cardiomegaly, aortic atherosclerosis. Lungs are clear. No effusions or acute bony abnormality. IMPRESSION: Mild cardiomegaly.  No active disease. Electronically Signed   By: Rolm Baptise M.D.   On: 08/13/2019 08:49   ECHOCARDIOGRAM COMPLETE  Result Date: 08/13/2019    ECHOCARDIOGRAM REPORT   Patient Name:   ZARAH CARBON Date of Exam: 08/13/2019 Medical Rec #:  [id number]          Height:       60.0 in Accession #:    [id number]         Weight:       113.0 lb Date of Birth:  10-04-1945          BSA:          1.464 m Patient Age:    74 years           BP:           194/92 mmHg Patient Gender: F                  HR:           76 bpm. Exam Location:  Inpatient Procedure: 2D Echo Indications:    acute diastolic chf 333.54  History:        Patient has prior history of Echocardiogram examinations, most                 recent 07/09/2018. Cardiomyopathy, CAD, COPD; Risk                 Factors:Dyslipidemia.   Sonographer:    Johny Chess Referring Phys: [id number] [place]  1. Left ventricular ejection fraction, by estimation, is 60 to 65%. The left ventricle has normal function. The left ventricle has no regional wall motion abnormalities. Left ventricular diastolic parameters are consistent with Grade II diastolic dysfunction (pseudonormalization). Elevated left ventricular end-diastolic pressure.  2. Right ventricular  systolic function is normal. The right ventricular size is normal. There is mildly elevated pulmonary artery systolic pressure.  3. Left atrial size was moderately dilated.  4. The mitral valve is normal in structure. Mild mitral valve regurgitation. No evidence of mitral stenosis.  5. The aortic valve is normal in structure. Aortic valve regurgitation is trivial. No aortic stenosis is present.  6. The inferior vena cava is normal in size with greater than 50% respiratory variability, suggesting right atrial pressure of 3 mmHg. FINDINGS  Left Ventricle: Left ventricular ejection fraction, by estimation, is 60 to 65%. The left ventricle has normal function. The left ventricle has no regional wall motion abnormalities. The left ventricular internal cavity size was normal in size. There is  no left ventricular hypertrophy. Left ventricular diastolic parameters are consistent with Grade II diastolic dysfunction (pseudonormalization). Elevated left ventricular end-diastolic pressure. Right Ventricle: The right ventricular size is normal. No increase in right ventricular wall thickness. Right ventricular systolic function is normal. There is mildly elevated pulmonary artery systolic pressure. The tricuspid regurgitant velocity is 3.12  m/s, and with an assumed right atrial pressure of 3 mmHg, the estimated right ventricular systolic pressure is 27.0 mmHg. Left Atrium: Left atrial size was moderately dilated. Right Atrium: Right atrial size was normal in size. Pericardium: There is no  evidence of pericardial effusion. Mitral Valve: The mitral valve is normal in structure. Normal mobility of the mitral valve leaflets. Mild mitral valve regurgitation. No evidence of mitral valve stenosis. Tricuspid Valve: The tricuspid valve is normal in structure. Tricuspid valve regurgitation is mild . No evidence of tricuspid stenosis. Aortic Valve: The aortic valve is normal in structure. Aortic valve regurgitation is trivial. No aortic stenosis is present. Pulmonic Valve: The pulmonic valve was normal in structure. Pulmonic valve regurgitation is trivial. No evidence of pulmonic stenosis. Aorta: The aortic root is normal in size and structure. Venous: The inferior vena cava is normal in size with greater than 50% respiratory variability, suggesting right atrial pressure of 3 mmHg. IAS/Shunts: The interatrial septum appears to be lipomatous. No atrial level shunt detected by color flow Doppler.  LEFT VENTRICLE PLAX 2D LVIDd:         5.10 cm  Diastology LVIDs:         3.60 cm  LV e' lateral:   9.57 cm/s LV PW:         1.00 cm  LV E/e' lateral: 10.8 LV IVS:        0.70 cm  LV e' medial:    7.07 cm/s LVOT diam:     1.85 cm  LV E/e' medial:  14.6 LV SV:         55 LV SV Index:   38 LVOT Area:     2.69 cm  RIGHT VENTRICLE             IVC RV S prime:     14.40 cm/s  IVC diam: 2.00 cm LEFT ATRIUM             Index       RIGHT ATRIUM           Index LA diam:        4.40 cm 3.00 cm/m  RA Area:     15.40 cm LA Vol (A2C):   58.2 ml 39.74 ml/m RA Volume:   40.10 ml  27.38 ml/m LA Vol (A4C):   59.2 ml 40.42 ml/m LA Biplane Vol: 63.3 ml 43.22 ml/m  AORTIC VALVE  LVOT Vmax:   86.60 cm/s LVOT Vmean:  54.900 cm/s LVOT VTI:    0.205 m  AORTA Ao Root diam: 2.50 cm Ao Asc diam:  2.80 cm MITRAL VALVE                TRICUSPID VALVE MV Area (PHT): 5.27 cm     TR Peak grad:   38.9 mmHg MV Decel Time: 144 msec     TR Vmax:        312.00 cm/s MV E velocity: 103.00 cm/s MV A velocity: 90.70 cm/s   SHUNTS MV E/A ratio:  1.14          Systemic VTI:  0.20 m                             Systemic Diam: 1.85 cm Fransico Him MD Electronically signed by Fransico Him MD Signature Date/Time: 08/13/2019/2:49:15 PM    Final     Assessment: 74 y.o. with pancreatic cancer on chemo, COPD, CAD, presented with worsening dyspnea   1.  Acute hypoxic respite failure, secondary to COPD exacerbation and congestive heart failure 2.  Hypertensive urgency 3.  Unresectable pancreatic cancer, on chemotherapy gemcitabine 4. AF, CAD  5.COPD  6. Recent allergy reaction to Udenyca  7.  Leukocytosis, secondary to Udenyca and steroids  Plan:  -COPD and CHF treatment per primary team -her leukocytosis is likely secondary to use Amicar and prednisone which I started on 7/26 (for 5 days) for her allergy reaction to udenyca, certainly infection needs to be ruled out -she developed neutropenia and thrombocytopenia after chemo, resolved now  -consider blood transfusion if Hg<8.0 -I will f/u as needed, please call me if anything I can help.    Truitt Merle, MD 08/13/2019

## 2019-08-13 NOTE — ED Provider Notes (Signed)
[facility] EMERGENCY DEPARTMENT Provider Note   CSN: [id number] Arrival date & time: 08/13/19  6761     History Chief Complaint  Patient presents with  . Shortness of Breath    Allison Vaughan is a 74 y.o. female history of pancreatic cancer on chemotherapy, COPD, CAD, A. fib, cardiomyopathy, hypercholesterolemia, IBS, osteoarthritis.  Patient presents via EMS today for shortness of breath onset 1 week ago.  Patient reports that she feels that she is in a COPD exacerbation she has been treating at home with albuterol nebulizers and prednisone with only temporary relief.  Shortness of breath is worsened over the last 2 days.  She reports some associated chest pain yesterday a dull pressure mild constant nonradiating no clear aggravating or alleviating factors resolved without intervention, no pain today.  She reports she called EMS today due to shortness of breath, they noted sats to be in the 90s they gave albuterol nebulizer and Solu-Medrol.  Patient reports moderate improvement of symptoms since that time.  She denies fever/chills, headache, cough/hemoptysis, abdominal pain, nausea/vomiting, extremity swelling/color change or any additional concerns.  Of note patient has not had the COVID-19 vaccine.  HPI     Past Medical History:  Diagnosis Date  . Accidental drug overdose   . Acute encephalopathy   . Acute on chronic respiratory failure with hypoxia ([place]) 03/01/2017  . Acute renal failure ([place]) 04/16/2014  . AKI (acute kidney injury) ([place]) 04/30/2014  . Altered mental status 06/27/2012  . Anxiety   . Atrial fibrillation ([place])   . CAD (coronary artery disease)   . CAP (community acquired pneumonia) 04/18/2014  . Cardiomyopathy ([place])   . Chronic hypoxemic respiratory failure ([place]) 06/26/2012  . Chronic pain   . COPD exacerbation ([place]) 06/26/2012  . COPD with acute exacerbation ([place]) 03/01/2017  . DEPRESSIVE DISORDER, NOS 03/13/2006   Qualifier: Diagnosis of  By:  Damita Dunnings MD, Phillip Heal    . Edema, peripheral   . Essential hypertension 04/30/2014  . Fall at home, initial encounter 03/01/2017  . GASTROESOPHAGEAL REFLUX, NO ESOPHAGITIS 03/13/2006   Qualifier: Diagnosis of  By: Damita Dunnings MD, Phillip Heal    . HYPERCHOLESTEROLEMIA 03/13/2006   Qualifier: Diagnosis of  By: Damita Dunnings MD, Phillip Heal    . Hyperlipidemia   . HYPERTENSION, BENIGN SYSTEMIC 03/13/2006   Qualifier: Diagnosis of  By: Damita Dunnings MD, Phillip Heal    . Hypokalemia 04/30/2014  . Hyponatremia 04/30/2014  . Hypotension 04/30/2014  . Hypoxemia 06/26/2012  . Hypoxia 03/26/2018  . Irritable bowel syndrome 03/13/2006   Qualifier: Diagnosis of  By: Damita Dunnings MD, Phillip Heal    . Lung nodule 04/16/2014  . MENOPAUSAL SYNDROME 03/13/2006   Qualifier: Diagnosis of  By: Damita Dunnings MD, Phillip Heal    . Mood disorder [facility]) 03/13/2006   Qualifier: Diagnosis of  By: Damita Dunnings MD, Phillip Heal    . 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 Nausea 04/30/2014  . Nausea vomiting and diarrhea 04/16/2014  . OSTEOARTHRITIS, MULTI SITES 03/13/2006   Qualifier: Diagnosis of  By: Damita Dunnings MD, Phillip Heal    . Pulmonary nodule 04/30/2014  . Respiratory failure ([place]) 01/22/2017  . TOBACCO DEPENDENCE 03/13/2006   Qualifier: Diagnosis of  By: Damita Dunnings MD, Phillip Heal    . Weight loss     Patient Active Problem List   Diagnosis Date Noted  . Allergy reaction, arthritis 08/09/2019  . Port-A-Cath in place 07/30/2019  . Goals of care, counseling/discussion 07/13/2019  . 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[place]) 07/12/2019  . Pancreatic neoplasm 06/24/2019  .  Prolonged QT interval 12/22/2018  . Hypomagnesemia 12/22/2018  . Constipation 12/22/2018  . Dilation of pancreatic duct 12/22/2018  . Altered mental status 07/09/2018  . Acute lower UTI 07/09/2018  . Anemia 07/09/2018  . Bronchiolitis 07/09/2018  . Bronchitis 07/09/2018  . Acute metabolic encephalopathy 29/52/8413  . Contusion of right wrist   . Fall   . Bacteria in urine   . Mild renal insufficiency 03/26/2018  .  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 Atrial fibrillation with RVR ([place]) 03/01/2017  . Acute on chronic respiratory failure with hypoxia ([place]) 03/01/2017  . Fall at home, initial encounter 03/01/2017  . Accidental drug overdose   . Respiratory failure ([place]) 01/22/2017  . Acute encephalopathy   . AKI (acute kidney injury) ([place]) 04/30/2014  . Pulmonary nodule 04/30/2014  . Essential hypertension 04/30/2014  . Hypokalemia 04/30/2014  . Hyponatremia 04/30/2014  . Hypotension 04/30/2014  . Nausea & vomiting 04/30/2014  . Lung nodule 04/16/2014  . Chronic hypoxemic respiratory failure ([place]) 06/26/2012  . COPD (chronic obstructive pulmonary disease) ([place]) 06/26/2012  . HYPERCHOLESTEROLEMIA 03/13/2006  . TOBACCO DEPENDENCE 03/13/2006  . Mood disorder ([place]) 03/13/2006  . HYPERTENSION, BENIGN SYSTEMIC 03/13/2006  . GASTROESOPHAGEAL REFLUX, NO ESOPHAGITIS 03/13/2006  . IRRITABLE BOWEL SYNDROME 03/13/2006  . MENOPAUSAL SYNDROME 03/13/2006  . OSTEOARTHRITIS, MULTI SITES 03/13/2006    Past Surgical History:  Procedure Laterality Date  . ABDOMINAL HYSTERECTOMY    . ESOPHAGOGASTRODUODENOSCOPY N/A 07/08/2019   Procedure: ESOPHAGOGASTRODUODENOSCOPY (EGD);  Surgeon: Milus Banister, MD;  Location: [facility];  Service: Endoscopy;  Laterality: N/A;  . ESOPHAGOGASTRODUODENOSCOPY (EGD) WITH PROPOFOL N/A 05/05/2019   Procedure: ESOPHAGOGASTRODUODENOSCOPY (EGD) WITH PROPOFOL;  Surgeon: Arta Silence, MD;  Location: [facility];  Service: Endoscopy;  Laterality: N/A;  . EUS N/A 07/08/2019   Procedure: UPPER ENDOSCOPIC ULTRASOUND (EUS) RADIAL;  Surgeon: Milus Banister, MD;  Location: [facility];  Service: Endoscopy;  Laterality: N/A;  . EYE SURGERY    . FIDUCIAL MARKER PLACEMENT N/A 07/08/2019   Procedure: FIDUCIAL MARKER PLACEMENT;  Surgeon: Milus Banister, MD;  Location: [facility];  Service: Endoscopy;  Laterality: N/A;  . FINE NEEDLE ASPIRATION N/A 05/05/2019   Procedure: FINE NEEDLE  ASPIRATION (FNA) LINEAR;  Surgeon: Arta Silence, MD;  Location: [facility];  Service: Endoscopy;  Laterality: N/A;  . FINE NEEDLE ASPIRATION N/A 07/08/2019   Procedure: FINE NEEDLE ASPIRATION (FNA) LINEAR;  Surgeon: Milus Banister, MD;  Location: [facility];  Service: Endoscopy;  Laterality: N/A;  . IR IMAGING GUIDED PORT INSERTION  07/27/2019  . ROTATOR CUFF REPAIR Right 2012  . UPPER ESOPHAGEAL ENDOSCOPIC ULTRASOUND (EUS) N/A 05/05/2019   Procedure: UPPER ESOPHAGEAL ENDOSCOPIC ULTRASOUND (EUS) with FNA;  Surgeon: Arta Silence, MD;  Location: [facility];  Service: Endoscopy;  Laterality: N/A;     OB History   No obstetric history on file.     Family History  Problem Relation Age of Onset  . CAD Father   . Lung cancer Brother   . Cancer Brother        lung ca  . Cancer Brother        possible pancreas cancer   . Healthy Daughter   . Healthy Daughter   . Cancer Maternal Grandmother        breast  . Cancer Paternal Grandmother        breast    Social History   Tobacco Use  . Smoking status: Current Every Day Smoker    Packs/day: 1.50  Years: 50.00    Pack years: 75.00    Types: Cigarettes  . Smokeless tobacco: Never Used  . Tobacco comment: states she is trying to quit  Vaping Use  . Vaping Use: Never used  Substance Use Topics  . Alcohol use: Not Currently    Comment: drank alcohol daily x20 years, quit 10 years ago  . Drug use: No    Home Medications Prior to Admission medications   Medication Sig Start Date End Date Taking? Authorizing Provider  albuterol (PROVENTIL HFA;VENTOLIN HFA) 108 (90 BASE) MCG/ACT inhaler Inhale 2 puffs into the lungs every 6 (six) hours as needed for wheezing. 06/09/12  Yes Oti, Brantley Stage, MD  albuterol (PROVENTIL) (2.5 MG/3ML) 0.083% nebulizer solution Take 3 mLs (2.5 mg total) by nebulization every 2 (two) hours as needed for wheezing. 04/20/14  Yes Rama, Venetia Maxon, MD  apixaban (ELIQUIS) 5 MG TABS tablet Take 1 tablet (5 mg  total) by mouth 2 (two) times daily. Patient taking differently: Take 5 mg by mouth daily.  02/01/19  Yes Kroeger, Lorelee Cover., PA-C  Aspirin-Salicylamide-Caffeine (BC HEADACHE PO) Take 1 packet by mouth 2 (two) times daily as needed (headaches).    Yes [provider]  atorvastatin (LIPITOR) 10 MG tablet Take 1 tablet (10 mg total) by mouth at bedtime. Patient taking differently: Take 10 mg by mouth every evening.  03/28/18  Yes Cherene Altes, MD  nitroGLYCERIN (NITROSTAT) 0.4 MG SL tablet Place 1 tablet (0.4 mg total) under the tongue every 5 (five) minutes as needed for chest pain. 02/01/19 08/12/20 Yes Kroeger, Daleen Snook M., PA-C  FLUoxetine (PROZAC) 40 MG capsule Take 40 mg by mouth every evening.     [provider]  lidocaine-prilocaine (EMLA) cream Apply to affected area as needed weekly 07/22/19   Truitt Merle, MD  LORazepam (ATIVAN) 0.5 MG tablet Take 0.5 mg by mouth at bedtime.     [provider]  losartan (COZAAR) 25 MG tablet Take 1 tablet (25 mg total) by mouth daily. Patient taking differently: Take 25 mg by mouth every evening.  02/01/19 07/30/19  Kroeger, Lorelee Cover., PA-C  metoprolol succinate (TOPROL-XL) 25 MG 24 hr tablet Take 2 tablets (50 mg total) by mouth daily. Take with or immediately following a meal. Patient taking differently: Take 50 mg by mouth every evening. Take with or immediately following a meal. 02/01/19 07/30/19  Kroeger, Lorelee Cover., PA-C  naproxen sodium (ALEVE) 220 MG tablet Take 220 mg by mouth daily as needed (headache).    [provider]  Nutritional Supplements (ENSURE ENLIVE PO) Take 237 mLs by mouth daily as needed (when want too).     [provider]  ondansetron (ZOFRAN ODT) 4 MG disintegrating tablet Take 1 tablet (4 mg total) by mouth every 6 (six) hours as needed for nausea or vomiting. 07/11/19   Ward, Delice Bison, DO  ondansetron (ZOFRAN) 8 MG tablet Take 1 tablet (8 mg total) by mouth 2 (two) times daily as needed  (Nausea or vomiting). 07/13/19   Truitt Merle, MD  oxyCODONE 10 MG TABS Take 1 tablet (10 mg total) by mouth every 6 (six) hours as needed for severe pain. 07/11/19   Ward, Delice Bison, DO  potassium chloride (KLOR-CON) 10 MEQ tablet Take 10 mEq by mouth every evening.  04/15/19   [provider]  predniSONE (DELTASONE) 10 MG tablet Take 1 tablet (10 mg total) by mouth daily with breakfast. 08/09/19   Truitt Merle, MD  prochlorperazine (COMPAZINE) 10  MG tablet Take 1 tablet (10 mg total) by mouth every 6 (six) hours as needed (Nausea or vomiting). 07/13/19   Truitt Merle, MD  umeclidinium bromide (INCRUSE ELLIPTA) 62.5 MCG/INH AEPB Inhale 1 puff into the lungs daily. 12/26/18   Florencia Reasons, MD    Allergies    Patient has no known allergies.  Review of Systems   Review of Systems Ten systems are reviewed and are negative for acute change except as noted in the HPI  Physical Exam Updated Vital Signs BP (!) 199/93   Pulse 77   Temp 98.3 F (36.8 C) (Oral)   Resp 17   Ht 5' (1.524 m)   Wt 51.3 kg   SpO2 96%   BMI 22.07 kg/m   Physical Exam Constitutional:      General: She is not in acute distress.    Appearance: Normal appearance. She is well-developed. She is not ill-appearing or diaphoretic.  HENT:     Head: Normocephalic and atraumatic.  Eyes:     General: Vision grossly intact. Gaze aligned appropriately.     Pupils: Pupils are equal, round, and reactive to light.  Neck:     Trachea: Trachea and phonation normal.  Cardiovascular:     Rate and Rhythm: Normal rate and regular rhythm.  Pulmonary:     Effort: Pulmonary effort is normal. Tachypnea present. No accessory muscle usage or respiratory distress.     Breath sounds: Wheezing present.  Abdominal:     General: There is no distension.     Palpations: Abdomen is soft.     Tenderness: There is no abdominal tenderness. There is no guarding or rebound.  Musculoskeletal:        General: Normal range of motion.     Cervical back:  Normal range of motion.     Right lower leg: No edema.     Left lower leg: No edema.  Skin:    General: Skin is warm and dry.  Neurological:     Mental Status: She is alert.     GCS: GCS eye subscore is 4. GCS verbal subscore is 5. GCS motor subscore is 6.     Comments: Speech is clear and goal oriented, follows commands Major Cranial nerves without deficit, no facial droop Moves extremities without ataxia, coordination intact  Psychiatric:        Behavior: Behavior normal.     ED Results / Procedures / Treatments   Labs (all labs ordered are listed, but only abnormal results are displayed) Labs Reviewed  BASIC METABOLIC PANEL - Abnormal; Notable for the following components:      Result Value   Glucose, Bld 112 (*)    Creatinine, Ser 1.36 (*)    Calcium 8.5 (*)    GFR calc non Af Amer 38 (*)    GFR calc Af Amer 44 (*)    All other components within normal limits  CBC WITH DIFFERENTIAL/PLATELET - Abnormal; Notable for the following components:   WBC 41.7 (*)    RBC 3.27 (*)    Hemoglobin 9.3 (*)    HCT 30.4 (*)    RDW 17.9 (*)    Platelets 145 (*)    nRBC 0.5 (*)    Neutro Abs 28.8 (*)    Monocytes Absolute 2.6 (*)    Abs Immature Granulocytes 7.92 (*)    All other components within normal limits  BRAIN NATRIURETIC PEPTIDE - Abnormal; Notable for the following components:   B Natriuretic Peptide 1,114.0 (*)  All other components within normal limits  HEPATIC FUNCTION PANEL - Abnormal; Notable for the following components:   Total Protein 6.2 (*)    Alkaline Phosphatase 238 (*)    All other components within normal limits  I-STAT VENOUS BLOOD GAS, ED - Abnormal; Notable for the following components:   pCO2, Ven 42.5 (*)    pO2, Ven 79.0 (*)    Calcium, Ion 1.12 (*)    HCT 30.0 (*)    Hemoglobin 10.2 (*)    All other components within normal limits  TROPONIN I (HIGH SENSITIVITY) - Abnormal; Notable for the following components:   Troponin I (High Sensitivity) 43  (*)    All other components within normal limits  TROPONIN I (HIGH SENSITIVITY) - Abnormal; Notable for the following components:   Troponin I (High Sensitivity) 44 (*)    All other components within normal limits  SARS CORONAVIRUS 2 BY RT PCR (HOSPITAL ORDER, [place] [facility])  CULTURE, BLOOD (SINGLE)  URINE CULTURE  LACTIC ACID, PLASMA  PROTIME-INR  APTT  LACTIC ACID, PLASMA  URINALYSIS, ROUTINE W REFLEX MICROSCOPIC    EKG EKG Interpretation  Date/Time:  Friday August 13 2019 85:02:77 EDT Ventricular Rate:  81 PR Interval:    QRS Duration: 89 QT Interval:  402 QTC Calculation: 467 R Axis:   25 Text Interpretation: Sinus rhythm Atrial premature complex No STEMI Confirmed by Octaviano Glow [phone]) on 08/13/2019 8:31:07 AM   Radiology DG Chest 2 View  Result Date: 08/13/2019 CLINICAL DATA:  Shortness of breath EXAM: CHEST - 2 VIEW COMPARISON:  07/10/2019 FINDINGS: Right Port-A-Cath in place with the tip at the cavoatrial junction. Cardiomegaly, aortic atherosclerosis. Lungs are clear. No effusions or acute bony abnormality. IMPRESSION: Mild cardiomegaly.  No active disease. Electronically Signed   By: Rolm Baptise M.D.   On: 08/13/2019 08:49    Procedures .Critical Care Performed by: Deliah Boston, PA-C Authorized by: Deliah Boston, PA-C   Critical care provider statement:    Critical care time (minutes):  42   Critical care was time spent personally by me on the following activities:  Discussions with consultants, evaluation of patient's response to treatment, examination of patient, ordering and performing treatments and interventions, ordering and review of laboratory studies, ordering and review of radiographic studies, pulse oximetry, re-evaluation of patient's condition, obtaining history from patient or surrogate, review of old charts and discussions with primary provider   (including critical care time)  Medications Ordered in  ED Medications  lactated ringers infusion (has no administration in time range)  ceFEPIme (MAXIPIME) 2 g in sodium chloride 0.9 % 100 mL IVPB (2 g Intravenous New Bag/Given 08/13/19 1130)  metroNIDAZOLE (FLAGYL) IVPB 500 mg (has no administration in time range)  vancomycin (VANCOCIN) IVPB 1000 mg/200 mL premix (has no administration in time range)  ceFEPIme (MAXIPIME) 2 g in sodium chloride 0.9 % 100 mL IVPB (has no administration in time range)  vancomycin (VANCOREADY) IVPB 500 mg/100 mL (has no administration in time range)  albuterol (VENTOLIN HFA) 108 (90 Base) MCG/ACT inhaler 6 puff (6 puffs Inhalation Given 08/13/19 0924)  morphine 4 MG/ML injection 4 mg (4 mg Intravenous Given 08/13/19 1139)    ED Course  I have reviewed the triage vital signs and the nursing notes.  Pertinent labs & imaging results that were available during my care of the patient were reviewed by me and considered in my medical decision making (see chart for details).  Clinical Course as  of Aug 12 1148  Fri Aug 13, 2019  1123 74 yo female w/ COPD (not on home O2), presenting to ED with worsening SOB for several days.  She is on IV chemo infusions for pancreatic cancer.  She feels worsening SOB gradual onset for several days.  Feels some wheezing at home.  She continues smoking daily, and suspected this was her COPD.  She received duonebs en route to hospital and felt better already in the ED.  She was initially reported hypoxic requiring supplemental O2, but on my assessment was stable on 94% on room air.  Physical exam with mild pitting edema and crackles in lung fields.  BNP elevated.  This is collectively more suggestive of CHF exacerbation than COPD.  Her last echo demonstrated Ef 60-65%   [MT]  1126 She also had a notable WBC of 41.7.  She did complete a prednisone course, but this is a significant elevation from her WBC of 2 on last presentation.  Given her immunosuppressed state, I felt it was prudent to initiate a  sepsis/infection w/u.  Unclear source at this time, we initiated BS antibiotics.  We performed a bedside echo to assess volume status showing a plethoric IVC.  I therefore felt it was prudent to HOLD her 30 cc/kg fluid bolus as this time, to not risk worsening her pulm edema.  Her lactate was 1.7.   [MT]    Clinical Course User Index [MT] Trifan, Carola Rhine, MD   MDM Rules/Calculators/A&P                         KARLEI WALDO was evaluated in Emergency Department on 08/13/2019 for the symptoms described in the history of present illness. She was evaluated in the context of the global COVID-19 pandemic, which necessitated consideration that the patient might be at risk for infection with the SARS-CoV-2 virus that causes COVID-19. Institutional protocols and algorithms that pertain to the evaluation of patients at risk for COVID-19 are in a state of rapid change based on information released by regulatory bodies including the CDC and federal and state organizations. These policies and algorithms were followed during the patient's care in the ED.  Additional history obtained from: 1. Nursing notes from this visit. 2. Electronic medical record reviewed.  Patient's last echocardiogram was on July 09, 2018 showed EF of 60-65%. -------------------------- 74 year old female with history as detailed above presented for shortness of breath x1 week.  Patient reports consistent with prior COPD exacerbation treated with albuterol and Solu-Medrol PTA.  She reports improvement of symptoms but persistent wheezing and shortness of breath.  On examination patient is nontoxic-appearing in no acute distress she has moderate wheezing.  No recent infectious type symptoms and no evidence of DVT, she is on Eliquis.  Suspect patient in COPD exacerbation, she did have some chest pain yesterday which is resolved will obtain chest pain/shortness of breath work-up.  CBC, BMP, troponin, EKG, BNP, chest x-ray ordered.  Will give  additional albuterol and obtain Covid test.  Patient placed on cardiac monitor and pulse oximeter.  Pulse ox mid 90s 4 L nasal cannula. - I ordered, reviewed and interpreted labs which include: Covid test negative. BNP elevated at 1114. CBC shows new leukocytosis of 41.7, possibly secondary to steroid use, hemoglobin slightly decreased from prior at 9.3. High-sensitivity troponin is 43, may be secondary to demand, will obtain delta troponin. BMP shows slight elevation in kidney function no emergent electrolyte derangement or gap.  Chest x-ray:  IMPRESSION:  Mild cardiomegaly. No active disease.   EKG: Sinus rhythm Atrial premature complex No STEMI Confirmed by Octaviano Glow [phone]) on 08/13/2019 8:31:07 AM --------------- On reassessment patient is resting comfortably no acute distress oxygenation has improved following albuterol.  Patient also seen and evaluated by Dr. Sunday Shams.  Given the new leukocytosis which is very elevated even with her steroid use decision was made to initiate code sepsis and give broad-spectrum antibiotics as patient is immunocompromised due to pancreatic cancer and chemotherapy.  She was started on a 150 cc/h lactated Ringer infusion.  The decision to withhold the full fluid bolus for sepsis was made because evaluation of her intravascular status via ultrasound of the IVC was reassuring, performed by Dr. Langston Masker.  Lactic had resulted at 1.7.  Plan of care is admission to hospitalist service for further treatment of now suspected CHF exacerbation.  Patient is agreeable for admission she has no additional questions or concerns. - 11:29 AM: Discussed case with Dr. Neysa Bonito, patient accepted to hospitalist service.    Note: Portions of this report may have been transcribed using voice recognition software. Every effort was made to ensure accuracy; however, inadvertent computerized transcription errors may still be present. Final Clinical Impression(s) / ED Diagnoses Final  diagnoses:  Acute on chronic congestive heart failure, unspecified heart failure type ([place])  Respiratory failure with hypoxia, unspecified chronicity [facility])    Rx / DC Orders ED Discharge Orders    None       Gari Crown 08/13/19 1150    Wyvonnia Dusky, MD 08/13/19 1719

## 2019-08-13 NOTE — Progress Notes (Signed)
  Echocardiogram 2D Echocardiogram has been performed.  Johny Chess 08/13/2019, 1:48 PM

## 2019-08-13 NOTE — H&P (Signed)
History and Physical        Hospital Admission Note Date: 08/13/2019  Patient name: Allison Vaughan Medical record number: [id number] Date of birth: October 09, 1945 Age: 74 y.o. Gender: female  PCP: Sandi Mariscal, MD  Patient coming from: home   Chief Complaint    Chief Complaint  Patient presents with   Shortness of Breath      HPI:   This is a 74 year old female with past medical history of pancreatic cancer on chemotherapy, COPD, CAD, A. fib on Eliquis, hyperlipidemia who presented to the ED with 1 week of worsening shortness of breath without improvement with her usual COPD meds at home.  Denies any fever, sick contacts.  She has not had her Covid vaccine.  No recent travel.  Some associated chest pain which has been mild nonradiating and constant without any clear aggravating or alleviating factors.  Has had a dry cough.  She called EMS due to her shortness of breath and they noted her to be in the 90s, gave her albuterol and Solu-Medrol and patient had some improvement since that time.  She reported to me that she has a very high salt diet and even has increased her salt intake over the past week.  ED Course: Hypertensive at 187/90 requiring 4 L/min nasal cannula and otherwise stable.  Notable labs: Creatinine 1.36 (baseline 1-1.2), alk phosphatas e 238, BNP 1114, troponin 43-> 44, WBC 41 (previously 1.91-week ago), COVID-19 negative.  CXR with mild cardiomegaly but otherwise no active disease.  Given vancomycin, metronidazole, cefepime, LR, albuterol  Vitals:   08/13/19 1159 08/13/19 1214  BP: (!) 194/92   Pulse: 78 76  Resp: 23 16  Temp:    SpO2: 93% 94%     Review of Systems:  Review of Systems  Constitutional: Negative for chills and fever.  Eyes: Negative.   Respiratory: Positive for cough, shortness of breath and wheezing. Negative for sputum production.    Cardiovascular: Positive for orthopnea. Negative for palpitations and leg swelling.  Gastrointestinal: Negative for nausea and vomiting.  Genitourinary: Negative.   Musculoskeletal: Negative.  Negative for myalgias.  Skin: Negative for rash.  All other systems reviewed and are negative.   Medical/Social/Family History   Past Medical History: Past Medical History:  Diagnosis Date   Accidental drug overdose    Acute encephalopathy    Acute on chronic respiratory failure with hypoxia ([place]) 03/01/2017   Acute renal failure ([place]) 04/16/2014   AKI (acute kidney injury) ([place]) 04/30/2014   Altered mental status 06/27/2012   Anxiety    Atrial fibrillation (HCC)    CAD (coronary artery disease)    CAP (community acquired pneumonia) 04/18/2014   Cardiomyopathy ([place])    Chronic hypoxemic respiratory failure ([place]) 06/26/2012   Chronic pain    COPD exacerbation ([place]) 06/26/2012   COPD with acute exacerbation ([place]) 03/01/2017   DEPRESSIVE DISORDER, NOS 03/13/2006   Qualifier: Diagnosis of  By: Damita Dunnings MD, Phillip Heal     Edema, peripheral    Essential hypertension 04/30/2014   Fall at home, initial encounter 03/01/2017   GASTROESOPHAGEAL REFLUX, NO ESOPHAGITIS 03/13/2006   Qualifier: Diagnosis of  By: Damita Dunnings MD, Burt Ek 03/13/2006   Qualifier:  Diagnosis of  By: Damita Dunnings MD, Phillip Heal     Hyperlipidemia    HYPERTENSION, BENIGN SYSTEMIC 03/13/2006   Qualifier: Diagnosis of  By: Damita Dunnings MD, Phillip Heal     Hypokalemia 04/30/2014   Hyponatremia 04/30/2014   Hypotension 04/30/2014   Hypoxemia 06/26/2012   Hypoxia 03/26/2018   Irritable bowel syndrome 03/13/2006   Qualifier: Diagnosis of  By: Damita Dunnings MD, [place]     Lung nodule 04/16/2014   MENOPAUSAL SYNDROME 03/13/2006   Qualifier: Diagnosis of  By: Damita Dunnings MD, Phillip Heal     Mood disorder [facility]) 03/13/2006   Qualifier: Diagnosis of  By: Damita Dunnings MD, Phillip Heal     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[place])    Nausea 04/30/2014    Nausea vomiting and diarrhea 04/16/2014   OSTEOARTHRITIS, MULTI SITES 03/13/2006   Qualifier: Diagnosis of  By: Damita Dunnings MD, Phillip Heal     Pulmonary nodule 04/30/2014   Respiratory failure ([place]) 01/22/2017   TOBACCO DEPENDENCE 03/13/2006   Qualifier: Diagnosis of  By: Damita Dunnings MD, Phillip Heal     Weight loss     Past Surgical History:  Procedure Laterality Date   ABDOMINAL HYSTERECTOMY     ESOPHAGOGASTRODUODENOSCOPY N/A 07/08/2019   Procedure: ESOPHAGOGASTRODUODENOSCOPY (EGD);  Surgeon: Milus Banister, MD;  Location: [facility];  Service: Endoscopy;  Laterality: N/A;   ESOPHAGOGASTRODUODENOSCOPY (EGD) WITH PROPOFOL N/A 05/05/2019   Procedure: ESOPHAGOGASTRODUODENOSCOPY (EGD) WITH PROPOFOL;  Surgeon: Arta Silence, MD;  Location: [facility];  Service: Endoscopy;  Laterality: N/A;   EUS N/A 07/08/2019   Procedure: UPPER ENDOSCOPIC ULTRASOUND (EUS) RADIAL;  Surgeon: Milus Banister, MD;  Location: [facility];  Service: Endoscopy;  Laterality: N/A;   EYE SURGERY     FIDUCIAL MARKER PLACEMENT N/A 07/08/2019   Procedure: FIDUCIAL MARKER PLACEMENT;  Surgeon: Milus Banister, MD;  Location: [facility];  Service: Endoscopy;  Laterality: N/A;   FINE NEEDLE ASPIRATION N/A 05/05/2019   Procedure: FINE NEEDLE ASPIRATION (FNA) LINEAR;  Surgeon: Arta Silence, MD;  Location: [facility];  Service: Endoscopy;  Laterality: N/A;   FINE NEEDLE ASPIRATION N/A 07/08/2019   Procedure: FINE NEEDLE ASPIRATION (FNA) LINEAR;  Surgeon: Milus Banister, MD;  Location: [facility];  Service: Endoscopy;  Laterality: N/A;   IR IMAGING GUIDED PORT INSERTION  07/27/2019   ROTATOR CUFF REPAIR Right 2012   UPPER ESOPHAGEAL ENDOSCOPIC ULTRASOUND (EUS) N/A 05/05/2019   Procedure: UPPER ESOPHAGEAL ENDOSCOPIC ULTRASOUND (EUS) with FNA;  Surgeon: Arta Silence, MD;  Location: [facility];  Service: Endoscopy;  Laterality: N/A;    Medications: Prior to Admission medications   Medication Sig Start Date End Date  Taking? Authorizing Provider  albuterol (PROVENTIL HFA;VENTOLIN HFA) 108 (90 BASE) MCG/ACT inhaler Inhale 2 puffs into the lungs every 6 (six) hours as needed for wheezing. 06/09/12  Yes Oti, Brantley Stage, MD  albuterol (PROVENTIL) (2.5 MG/3ML) 0.083% nebulizer solution Take 3 mLs (2.5 mg total) by nebulization every 2 (two) hours as needed for wheezing. 04/20/14  Yes Rama, Venetia Maxon, MD  Aspirin-Salicylamide-Caffeine (BC HEADACHE PO) Take 1 packet by mouth 2 (two) times daily as needed (headaches).    Yes [provider]  atorvastatin (LIPITOR) 10 MG tablet Take 1 tablet (10 mg total) by mouth at bedtime. Patient taking differently: Take 10 mg by mouth every evening.  03/28/18  Yes Cherene Altes, MD  Calcium Carbonate (CALCIUM 500 PO) Take 500 mg by mouth daily.   Yes [provider]  Cholecalciferol (VITAMIN D3) 50 MCG (2000 UT) capsule Take 2,000 Units by mouth daily.  Yes [provider]  FLUoxetine (PROZAC) 40 MG capsule Take 40 mg by mouth every evening.    Yes [provider]  lidocaine-prilocaine (EMLA) cream Apply to affected area as needed weekly 07/22/19  Yes Truitt Merle, MD  LORazepam (ATIVAN) 0.5 MG tablet Take 0.5 mg by mouth at bedtime.    Yes [provider]  losartan (COZAAR) 25 MG tablet Take 1 tablet (25 mg total) by mouth daily. Patient taking differently: Take 25 mg by mouth every evening.  02/01/19 08/12/20 Yes Kroeger, Lorelee Cover., PA-C  metoprolol succinate (TOPROL-XL) 25 MG 24 hr tablet Take 2 tablets (50 mg total) by mouth daily. Take with or immediately following a meal. Patient taking differently: Take 50 mg by mouth every evening. Take with or immediately following a meal. 02/01/19 08/12/20 Yes Kroeger, Lorelee Cover., PA-C  naproxen sodium (ALEVE) 220 MG tablet Take 220 mg by mouth daily as needed (headache).   Yes [provider]  nitroGLYCERIN (NITROSTAT) 0.4 MG SL tablet Place 1 tablet (0.4 mg total) under the tongue every 5  (five) minutes as needed for chest pain. 02/01/19 08/12/20 Yes Kroeger, Lorelee Cover., PA-C  omeprazole (PRILOSEC) 40 MG capsule Take 40 mg by mouth every evening.   Yes [provider]  ondansetron (ZOFRAN) 8 MG tablet Take 1 tablet (8 mg total) by mouth 2 (two) times daily as needed (Nausea or vomiting). 07/13/19  Yes Truitt Merle, MD  oxyCODONE 10 MG TABS Take 1 tablet (10 mg total) by mouth every 6 (six) hours as needed for severe pain. 07/11/19  Yes Ward, Delice Bison, DO  potassium chloride (KLOR-CON) 10 MEQ tablet Take 10 mEq by mouth every evening.  04/15/19  Yes [provider]  predniSONE (DELTASONE) 10 MG tablet Take 1 tablet (10 mg total) by mouth daily with breakfast. 08/09/19  Yes Truitt Merle, MD  prochlorperazine (COMPAZINE) 10 MG tablet Take 1 tablet (10 mg total) by mouth every 6 (six) hours as needed (Nausea or vomiting). 07/13/19  Yes Truitt Merle, MD  apixaban (ELIQUIS) 5 MG TABS tablet Take 1 tablet (5 mg total) by mouth 2 (two) times daily. Patient not taking: Reported on 08/13/2019 02/01/19   Abigail Butts., PA-C  ondansetron (ZOFRAN ODT) 4 MG disintegrating tablet Take 1 tablet (4 mg total) by mouth every 6 (six) hours as needed for nausea or vomiting. Patient not taking: Reported on 08/13/2019 07/11/19   Ward, Delice Bison, DO  umeclidinium bromide (INCRUSE ELLIPTA) 62.5 MCG/INH AEPB Inhale 1 puff into the lungs daily. Patient not taking: Reported on 08/13/2019 12/26/18   Florencia Reasons, MD    Allergies:  No Known Allergies  Social History:  reports that she has been smoking cigarettes. She has a 75.00 pack-year smoking history. She has never used smokeless tobacco. She reports previous alcohol use. She reports that she does not use drugs.  Family History: Family History  Problem Relation Age of Onset   CAD Father    Lung cancer Brother    Cancer Brother        lung ca   Cancer Brother        possible pancreas cancer    Healthy Daughter    Healthy Daughter    Cancer  Maternal Grandmother        breast   Cancer Paternal Grandmother        breast     Objective   Physical Exam: Blood pressure (!) 194/92, pulse 76, temperature 98.3 F (36.8 C), temperature source  Oral, resp. rate 16, height 5' (1.524 m), weight 51.3 kg, SpO2 94 %.  Physical Exam Vitals and nursing note reviewed.  Constitutional:      Appearance: Normal appearance.  HENT:     Head: Normocephalic and atraumatic.  Eyes:     Conjunctiva/sclera: Conjunctivae normal.  Cardiovascular:     Rate and Rhythm: Normal rate and regular rhythm.     Comments: Positive JVD Pulmonary:     Effort: Pulmonary effort is normal.     Breath sounds: Examination of the right-lower field reveals rales. Examination of the left-lower field reveals rales. Wheezing and rales present.  Abdominal:     General: Abdomen is flat.     Palpations: Abdomen is soft.  Musculoskeletal:        General: No swelling or tenderness.     Right lower leg: No edema.     Left lower leg: No edema.  Skin:    Coloration: Skin is not jaundiced or pale.  Neurological:     Mental Status: She is alert. Mental status is at baseline.  Psychiatric:        Mood and Affect: Mood normal.        Behavior: Behavior normal.     LABS on Admission: I have personally reviewed all the labs and imaging below    Basic Metabolic Panel: Recent Labs  Lab 08/06/19 1515 08/06/19 1515 08/13/19 0834 08/13/19 0844  NA 140   < > 137 140  K 4.4   < > 4.0 4.1  CL 106  --  104  --   CO2 21*  --  22  --   GLUCOSE 93  --  112*  --   BUN 12  --  18  --   CREATININE 1.12*  --  1.36*  --   CALCIUM 8.7*  --  8.5*  --    < > = values in this interval not displayed.   Liver Function Tests: Recent Labs  Lab 08/06/19 1515 08/13/19 1054  AST 15 37  ALT <6 18  ALKPHOS 133* 238*  BILITOT 0.4 0.7  PROT 6.1* 6.2*  ALBUMIN 3.4* 3.5   No results for input(s): LIPASE, AMYLASE in the last 168 hours. No results for input(s): AMMONIA in the  last 168 hours. CBC: Recent Labs  Lab 08/06/19 1515 08/06/19 1515 08/13/19 0834 08/13/19 0844  WBC 1.9*  --  41.7*  --   NEUTROABS 0.8*   < > 28.8*  --   HGB 10.2*  --  9.3* 10.2*  HCT 30.7*   < > 30.4* 30.0*  MCV 87.5   < > 93.0  --   PLT 54*  --  145*  --    < > = values in this interval not displayed.   Cardiac Enzymes: No results for input(s): CKTOTAL, CKMB, CKMBINDEX, TROPONINI in the last 168 hours. BNP: Invalid input(s): POCBNP CBG: No results for input(s): GLUCAP in the last 168 hours.  Radiological Exams on Admission:  DG Chest 2 View  Result Date: 08/13/2019 CLINICAL DATA:  Shortness of breath EXAM: CHEST - 2 VIEW COMPARISON:  07/10/2019 FINDINGS: Right Port-A-Cath in place with the tip at the cavoatrial junction. Cardiomegaly, aortic atherosclerosis. Lungs are clear. No effusions or acute bony abnormality. IMPRESSION: Mild cardiomegaly.  No active disease. Electronically Signed   By: Rolm Baptise M.D.   On: 08/13/2019 08:49      EKG: Independently reviewed.  Sinus rhythm with PACs   A & P  Principal Problem:   Acute respiratory failure with hypoxia (HCC) Active Problems:   COPD exacerbation (HCC)   Acute CHF (congestive heart failure) (HCC)   Hypertensive urgency   1. Acute hypoxic respiratory failure, multifactorial: COPD exacerbation, heart failure exacerbation a. Typically tolerates room air, currently requiring 4 L/min b. Increased cough and wheezing, BNP 1000+ JVD and orthopnea, previously was 39 1 year ago c. Afebrile but WBC significantly elevated at 41, previously 1.9  d. Continue IV steroids e. Continue inhalers f. Hold IV fluids g. Diuresis h. Will change antibiotics for now to Doxycycline and cefepime given her cancer/chemo history and WBC 41  2. Suspected diastolic heart failure exacerbation a. Last EF 60 to 65% in June 2020 b. BNP significantly elevated as well c. Continue with diuresis d. Continue beta-blocker e. Hold  losartan f. Low-sodium diet g. Dietary consulted for education h. I/O and daily weights i. Check echo  3. Hypertensive urgency a. One-time dose hydralazine b. Continue home meds  4. Atrial fibrillation a. Rate controlled b. Continue Toprol-XL and Eliquis  5. History of pancreatic cancer a. Follows with Dr. Burr Medico   DVT prophylaxis: Eliquis   Code Status: Prior  Diet: Low-sodium diet Family Communication: Admission, patients condition and plan of care including tests being ordered have been discussed with the patient who indicates understanding and agrees with the plan and Code Status.  Disposition Plan: The appropriate patient status for this patient is INPATIENT. Inpatient status is judged to be reasonable and necessary in order to provide the required intensity of service to ensure the patient's safety. The patient's presenting symptoms, physical exam findings, and initial radiographic and laboratory data in the context of their chronic comorbidities is felt to place them at high risk for further clinical deterioration. Furthermore, it is not anticipated that the patient will be medically stable for discharge from the hospital within 2 midnights of admission. The following factors support the patient status of inpatient.   " The patient's presenting symptoms include hypoxia, shortness of breath. " The worrisome physical exam findings include hypoxia, hypertensive urgency. " The initial radiographic and laboratory data are worrisome because of significantly elevated BNP, leukocytosis 41. " The chronic co-morbidities include COPD, pancreatic cancer, diastolic heart failure, A. fib.   * I certify that at the point of admission it is my clinical judgment that the patient will require inpatient hospital care spanning beyond 2 midnights from the point of admission due to high intensity of service, high risk for further deterioration and high frequency of surveillance required.*   Status is:  Inpatient  Remains inpatient appropriate because:IV treatments appropriate due to intensity of illness or inability to take PO and Inpatient level of care appropriate due to severity of illness   Dispo: The patient is from: Home              Anticipated d/c is to: TBD              Anticipated d/c date is: 3 days              Patient currently is not medically stable to d/c.     Consultants   None  Procedures   none  Time Spent on Admission: 33 minutes    Harold Hedge, DO [facility] Pager [phone] 08/13/2019, 1:01 PM

## 2019-08-13 NOTE — ED Provider Notes (Signed)
Ultrasound ED Echo  Date/Time: 08/13/2019 5:20 PM Performed by: Wyvonnia Dusky, MD Authorized by: Wyvonnia Dusky, MD   Procedure details:    Indications comment:  Assess volume status   Views: IVC view     Images: archived   Findings:    IVC: normal    Impression:  Probably normal to elevated CVP    Wyvonnia Dusky, MD 08/13/19 1721

## 2019-08-13 NOTE — Progress Notes (Signed)
Pharmacy Antibiotic Note  Allison Vaughan is a 74 y.o. female admitted on 08/13/2019 with sepsis.  Pharmacy has been consulted for Cefepime & Vancomycin dosing.  Plan: Cefepime 2g IV Q24h Vancomycin 1000mg  IV x1 then Vancomycin 500mg  IV Q24h Monitor renal function, CBC, cultures, & s/sx of clinical improvement  Height: 5' (152.4 cm) Weight: 51.3 kg (113 lb) IBW/kg (Calculated) : 45.5  Temp (24hrs), Avg:98.3 F (36.8 C), Min:98.3 F (36.8 C), Max:98.3 F (36.8 C)  Recent Labs  Lab 08/06/19 1515 08/13/19 0834  WBC 1.9* 41.7*  CREATININE 1.12* 1.36*    Estimated Creatinine Clearance: 25.3 mL/min (A) (by C-G formula based on SCr of 1.36 mg/dL (H)).    Allergies  Allergen Reactions  . Other Itching, Swelling and Rash    Udenyca  . Compazine [Prochlorperazine Edisylate] Itching    Antimicrobials this admission: Cefepime 7/30 >>  Vancomycin 7/30 >>   Dose adjustments this admission:   Microbiology results: 7/30 BCx: pending 7/30 UCx: pending    Thank you for allowing pharmacy to be a part of this patient's care.  Beckey Rutter, PharmD Candidate 08/13/2019 10:36 AM

## 2019-08-13 NOTE — ED Triage Notes (Signed)
Pt BIB ems, c/o worsening sob x 1 week; wheezing uppers, rhonchi lowers on ems arrival; RA sat 90%; 4L placed by FD, 96%; 5mg  albuterol, 0.5 mg atrovent,125 mg solumedrol given PTA; lower lobes clearing after meds, still wheezing in R upper lobe; chemo pt, pancreatic cancer  175/104 P 77 RR 20 96% 4L T 97.7 F

## 2019-08-14 LAB — CBC
HCT: 30 % — ABNORMAL LOW (ref 36.0–46.0)
Hemoglobin: 9.7 g/dL — ABNORMAL LOW (ref 12.0–15.0)
MCH: 29 pg (ref 26.0–34.0)
MCHC: 32.3 g/dL (ref 30.0–36.0)
MCV: 89.6 fL (ref 80.0–100.0)
Platelets: 135 10*3/uL — ABNORMAL LOW (ref 150–400)
RBC: 3.35 MIL/uL — ABNORMAL LOW (ref 3.87–5.11)
RDW: 17.5 % — ABNORMAL HIGH (ref 11.5–15.5)
WBC: 34.5 10*3/uL — ABNORMAL HIGH (ref 4.0–10.5)
nRBC: 0.3 % — ABNORMAL HIGH (ref 0.0–0.2)

## 2019-08-14 LAB — URINE CULTURE: Culture: NO GROWTH

## 2019-08-14 LAB — BASIC METABOLIC PANEL
Anion gap: 11 (ref 5–15)
BUN: 30 mg/dL — ABNORMAL HIGH (ref 8–23)
CO2: 27 mmol/L (ref 22–32)
Calcium: 8.2 mg/dL — ABNORMAL LOW (ref 8.9–10.3)
Chloride: 98 mmol/L (ref 98–111)
Creatinine, Ser: 1.72 mg/dL — ABNORMAL HIGH (ref 0.44–1.00)
GFR calc Af Amer: 33 mL/min — ABNORMAL LOW (ref >60)
GFR calc non Af Amer: 28 mL/min — ABNORMAL LOW (ref >60)
Glucose, Bld: 177 mg/dL — ABNORMAL HIGH (ref 70–99)
Potassium: 4 mmol/L (ref 3.5–5.1)
Sodium: 136 mmol/L (ref 135–145)

## 2019-08-14 LAB — HIV ANTIBODY (ROUTINE TESTING W REFLEX): HIV Screen 4th Generation wRfx: NONREACTIVE

## 2019-08-14 LAB — MAGNESIUM: Magnesium: 1.6 mg/dL — ABNORMAL LOW (ref 1.7–2.4)

## 2019-08-14 MED ORDER — MAGNESIUM SULFATE 2 GM/50ML IV SOLN
2.0000 g | Freq: Once | INTRAVENOUS | Status: AC
  Administered 2019-08-14: 2 g via INTRAVENOUS
  Filled 2019-08-14: qty 50

## 2019-08-14 MED ORDER — HYDRALAZINE HCL 20 MG/ML IJ SOLN
5.0000 mg | Freq: Three times a day (TID) | INTRAMUSCULAR | Status: DC | PRN
  Administered 2019-08-14: 5 mg via INTRAVENOUS
  Filled 2019-08-14: qty 1

## 2019-08-14 MED ORDER — LORAZEPAM 0.5 MG PO TABS
0.5000 mg | ORAL_TABLET | Freq: Two times a day (BID) | ORAL | Status: DC | PRN
  Administered 2019-08-14: 0.5 mg via ORAL
  Filled 2019-08-14: qty 1

## 2019-08-14 NOTE — Evaluation (Signed)
Physical Therapy Evaluation Patient Details Name: Allison Vaughan MRN: [id number] DOB: 1945/03/15 Today's Date: 08/14/2019   History of Present Illness  This is a 74 year old female with past medical history of pancreatic cancer on chemotherapy, COPD, CAD, A. fib on Eliquis, hyperlipidemia who presented to the ED with 1 week of worsening shortness of breath without improvement with her usual COPD meds at home.    Clinical Impression  Patient received standing beside bed. Nasal canula on floor not donned. O2 saturations in low 90%s. Patient agreeable to PT assessment. Patient reports she lives with daughter and others in the home who all smoke in the home. This will be a concern if she is to go home with oxygen.  She ambulated 120 feet without ad and supervision. No lob, O2 saturations decreased during ambulation to 86% on room air. With brief standing rest break and pursed lip breathing saturations recovered to mid 90%s. She will continue to benefit from skilled PT while here to improve activity tolerance and education regarding pacing of activities.         Follow Up Recommendations No PT follow up    Equipment Recommendations  None recommended by PT    Recommendations for Other Services       Precautions / Restrictions Precautions Precautions: Fall Restrictions Weight Bearing Restrictions: No      Mobility  Bed Mobility Overal bed mobility: Independent                Transfers Overall transfer level: Independent               General transfer comment: patient received standing in room  Ambulation/Gait Ambulation/Gait assistance: Supervision Gait Distance (Feet): 120 Feet Assistive device: None Gait Pattern/deviations: WFL(Within Functional Limits) Gait velocity: slightly decr   General Gait Details: gait appears to be [facility]. No overt LOB or difficulty noted aside form SOB. He sats dropped to 86% whil walking with brief standing rest and pursed lip breathing  sats returned to mid 90%s on room air.  Stairs            Wheelchair Mobility    Modified Rankin (Stroke Patients Only)       Balance Overall balance assessment: Mild deficits observed, not formally tested                                           Pertinent Vitals/Pain Pain Assessment: No/denies pain    Home Living Family/patient expects to be discharged to:: Private residence Living Arrangements: Children Available Help at Discharge: Available 24 hours/day;Family;Friend(s) Type of Home: House Home Access: Level entry;Stairs to enter Entrance Stairs-Rails: None Entrance Stairs-Number of Steps: 2 Home Layout: One level Home Equipment: Environmental consultant - 2 wheels      Prior Function Level of Independence: Independent   Gait / Transfers Assistance Needed: independent PTA           Hand Dominance   Dominant Hand: Right    Extremity/Trunk Assessment   Upper Extremity Assessment Upper Extremity Assessment: Overall WFL for tasks assessed    Lower Extremity Assessment Lower Extremity Assessment: Overall WFL for tasks assessed    Cervical / Trunk Assessment Cervical / Trunk Assessment: Normal  Communication   Communication: No difficulties  Cognition Arousal/Alertness: Awake/alert Behavior During Therapy: WFL for tasks assessed/performed Overall Cognitive Status: Within Functional Limits for tasks assessed  General Comments      Exercises     Assessment/Plan    PT Assessment Patient needs continued PT services  PT Problem List Decreased activity tolerance;Decreased mobility;Cardiopulmonary status limiting activity       PT Treatment Interventions Therapeutic activities;Therapeutic exercise;Gait training;Functional mobility training;Stair training;Patient/family education    PT Goals (Current goals can be found in the Care Plan section)  Acute Rehab PT Goals Patient Stated Goal:  to return home PT Goal Formulation: With patient Time For Goal Achievement: 08/21/19 Potential to Achieve Goals: Good    Frequency Min 3X/week   Barriers to discharge        Co-evaluation               AM-PAC PT "6 Clicks" Mobility  Outcome Measure Help needed turning from your back to your side while in a flat bed without using bedrails?: None Help needed moving from lying on your back to sitting on the side of a flat bed without using bedrails?: None Help needed moving to and from a bed to a chair (including a wheelchair)?: None Help needed standing up from a chair using your arms (e.g., wheelchair or bedside chair)?: None Help needed to walk in hospital room?: A Little Help needed climbing 3-5 steps with a railing? : A Little 6 Click Score: 22    End of Session Equipment Utilized During Treatment: Gait belt Activity Tolerance: Patient tolerated treatment well Patient left: in bed;with call bell/phone within reach Nurse Communication: Mobility status PT Visit Diagnosis: Difficulty in walking, not elsewhere classified (R26.2);Muscle weakness (generalized) (M62.81)    Time: 1105-1120 PT Time Calculation (min) (ACUTE ONLY): 15 min   Charges:   PT Evaluation $PT Eval Low Complexity: 1 Low PT Treatments $Gait Training: 8-22 mins      Lauree Yurick, PT, GCS 08/14/19,11:42 AM

## 2019-08-14 NOTE — Discharge Instructions (Signed)

## 2019-08-14 NOTE — Progress Notes (Signed)
Initial Nutrition Assessment  RD working remotely.  DOCUMENTATION CODES:   Not applicable  INTERVENTION:   - Provided low sodium diet education and handout  - Ensure Enlive po BID, each supplement provides 350 kcal and 20 grams of protein  NUTRITION DIAGNOSIS:   Increased nutrient needs related to cancer and cancer related treatments, chronic illness (COPD, CHF, pancreatic cancer undergoing chemotherapy) as evidenced by estimated needs.  GOAL:   Patient will meet greater than or equal to 90% of their needs  MONITOR:   PO intake, Supplement acceptance, Labs, Weight trends, I & O's  REASON FOR ASSESSMENT:   Consult COPD Protocol, Other (new onset CHF)  ASSESSMENT:   74 year old female who presented on 7/30 with SOB. PMH of COPD, unresectable pancreatic cancer on chemotherapy (gemcitabine), CAD, atrial fibrillation, IBS, osteoarthritis, HLD. Pt admitted with COPD exacerbation and new onset CHF.  Pt on a 2 gram sodium diet with no meal completions recorded at this time.  Spoke with pt via phone call to room. Pt reports that her appetite is good. She states that she does not eat much at baseline and that she is currently at her baseline. Pt reports that she eats smaller meals throughout the day ("picking at my food"). Pt reports that this is normal for her and she has not had any changes in her appetite or eating patterns recently.  Pt does not some nausea after her chemo treatment 2 weeks ago but that is resolved quickly.  Pt reports that for breakfast this morning she ate a little less than 50% of her meal.  Pt reports that she "is the worst" when it comes to salt and consumes a high-sodium diet at baseline. Pt reports that trying to eat the food on the low sodium diet here at the hospital is difficult because it has no taste.  RD has attached "Low Sodium Nutrition Therapy" handout and "Sodium Free Flavoring Tips" handout from the [employer] to pt's  AVS/Discharge Instructions. Pt is aware to look for these handouts at discharge. Provided examples on ways to decrease sodium intake in diet. Discouraged intake of processed foods and use of salt shaker. Encouraged fresh fruits and vegetables as well as whole grain sources of carbohydrates to maximize fiber intake.   RD discussed why it is important for patient to adhere to diet recommendations, and emphasized the role of fluids, foods to avoid, and importance of weighing self daily. Teach back method used.  Pt denies any recent changes in her weight and repots her UBW as 110 lbs. Noted that pt's weight has been trending up over the last month. Suspect weight gain related to fluid status given new diagnosis of CHF.  Medications reviewed and include: IV solu-medrol, IV abx  Labs reviewed: magnesium 1.6, hemoglobin 9.7  NUTRITION - FOCUSED PHYSICAL EXAM:  Unable to complete at this time. RD working remotely.  Diet Order:   Diet Order            Diet 2 gram sodium Room service appropriate? Yes; Fluid consistency: Thin  Diet effective now                 EDUCATION NEEDS:   Education needs have been addressed  Skin:  Skin Assessment: Reviewed RN Assessment  Last BM:  08/13/19  Height:   Ht Readings from Last 1 Encounters:  08/13/19 5' (1.524 m)    Weight:   Wt Readings from Last 1 Encounters:  08/14/19 54.1 kg  Ideal Body Weight:  45.5 kg  BMI:  Body mass index is 23.29 kg/m.  Estimated Nutritional Needs:   Kcal:  1500-1700  Protein:  75-90 grams  Fluid:  1.5 L/day    Gaynell Face, MS, RD, LDN Inpatient Clinical Dietitian Please see [facility] for contact information.

## 2019-08-14 NOTE — Progress Notes (Signed)
PROGRESS NOTE  Allison Vaughan  DOB: 07/05/1945  PCP: Sun, Yun, MD MRN:[id number]  DOA: 08/13/2019  LOS: 1 day   Chief Complaint  Patient presents with  . Shortness of Breath   Brief narrative: Patient is a 74-year-old female with PMH significant for pancreatic cancer on chemotherapy, COPD, CAD, A. fib on Eliquis, hyperlipidemia. Patient presented to the ED on 7/30 with 1 week of worsening shortness of breath, not responding to her COPD meds. She has not had her Covid vaccine.  No recent travel. Reports she has a very high salt diet and even has increased her salt intake over the past week. She tolerated chemotherapy with single agent gemcitabine about 2 weeks ago.  In the ED, blood pressure was elevated to 187/90, required 4 L/min oxygen by nasal cannula. Labs with elevated creatinine 1.36 (baseline 1-1.2), alk phosphatase 238, BNP 1114, troponin 43-> 44, WBC 41 (previously 1.91-week ago), COVID-19 negative.   CXR with mild cardiomegaly but otherwise no active disease.    Patient was admitted to hospital service for further evaluation management.  Subjective: Patient was seen and examined this morning.  Pleasant elderly Caucasian female.  Lying on bed.  Feels somewhat better than yesterday.  Remains on supplemental oxygen.  3 L/min. I asked her about code vaccination status.  She has not gotten her vaccine.  She wants to get it.  She also mentioned that her son died of COVID-19 last year.  She is tearful mentioning that. Chart reviewed. No fever, blood pressure continues to run high, as high as 203/117 this morning. Labs from this morning with creatinine further worse at 1.72, magnesium low at 1.6 WBC count down to 34.5, hemoglobin 9.7 Echocardiogram with 60 to 65% EF and grade 2 diastolic dysfunction.  Assessment/Plan: Acute hypoxic respiratory failure -multifactorial: COPD exacerbation, heart failure exacerbation -Not on supplemental oxygen at home.  Currently on 3 to 4 L by  nasal cannula.  Wean down as tolerated.  COPD exacerbation -Reported increased cough and wheezing -Currently on IV cefepime and doxycycline, IV Solu-Medrol 60 mg every 6 hours and inhalers.  Acute exacerbation of diastolic CHF Essential hypertension -BNP 1000+ JVD and orthopnea,  -Last EF 60 to 65% in June 2020 -Home meds include losartan 5 mg daily, Toprol 50 mg daily. -Does not seem to be diuretics at home. -Currently on metoprolol and Lasix 40 mg IV twice daily. -Since creatinine is significantly up today at 1.72, I would hold further IV Lasix. -Continue to hold losartan.   -Low-sodium diet -Repeat echocardiogram  Acute kidney injury on CKD 3a -Baseline GFR.  50-60.  -Presented with creatinine of 1.36, worsened to 1.72 today, likely due to diuretics.  Last dose of Lasix was last night. -Repeat creatinine tomorrow.  Atrial fibrillation -Rate controlled on Toprol-XL.  Continue Eliquis for anticoagulation.  Unresectable pancreatic cancer -Follows with Dr. Feng -Currently on chemotherapy gemcitabine.  She tolerated chemotherapy with single agent gemcitabine about 2 weeks ago.  Leukocytosis -WBC count elevated to 41K >> 34K -secondary to Amicar and steroids given on 7/26 for 5 days. 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  Currently counts are stable.  Transfusion if less than 8  Mobility: Encourage ambulation.  PT evaluation ordered. Code Status:   Code Status: Full Code  Nutritional status: Body mass index is 23.29 kg/m.     Diet Order            Diet 2 gram sodium Room service appropriate? Yes; Fluid consistency: Thin  Diet effective   now                 DVT prophylaxis: apixaban (ELIQUIS) tablet 5 mg    Antimicrobials:  IV cefepime, doxycycline, Fluid: None IV fluid  Consultants: Oncology Family Communication:  None at bedside  Status is: Inpatient  Remains inpatient appropriate because:Ongoing diagnostic testing needed not appropriate for  outpatient work up and IV treatments appropriate due to intensity of illness or inability to take PO   Dispo: The patient is from: Home              Anticipated d/c is to: Home, pending PT eval              Anticipated d/c date is: 3 days              Patient currently is not medically stable to d/c.       Infusions:  . ceFEPime (MAXIPIME) IV      Scheduled Meds: . apixaban  5 mg Oral BID  . arformoterol  15 mcg Nebulization BID  . atorvastatin  10 mg Oral QHS  . doxycycline  100 mg Oral Q12H  . furosemide  40 mg Intravenous BID  . LORazepam  0.5 mg Oral QHS  . methylPREDNISolone (SOLU-MEDROL) injection  60 mg Intravenous Q6H   Followed by  . [START ON 08/15/2019] predniSONE  40 mg Oral Q breakfast  . metoprolol succinate  50 mg Oral Daily  . sodium chloride flush  3 mL Intravenous Q12H    Antimicrobials: Anti-infectives (From admission, onward)   Start     Dose/Rate Route Frequency Ordered Stop   08/14/19 1130  ceFEPIme (MAXIPIME) 2 g in sodium chloride 0.9 % 100 mL IVPB     Discontinue     2 g 200 mL/hr over 30 Minutes Intravenous Every 24 hours 08/13/19 1847 08/19/19 1129   08/14/19 1100  ceFEPIme (MAXIPIME) 2 g in sodium chloride 0.9 % 100 mL IVPB  Status:  Discontinued        2 g 200 mL/hr over 30 Minutes Intravenous Every 24 hours 08/13/19 1047 08/13/19 1847   08/14/19 1100  vancomycin (VANCOREADY) IVPB 500 mg/100 mL  Status:  Discontinued        500 mg 100 mL/hr over 60 Minutes Intravenous Every 24 hours 08/13/19 1047 08/13/19 1847   08/13/19 2200  doxycycline (VIBRA-TABS) tablet 100 mg     Discontinue     100 mg Oral Every 12 hours 08/13/19 1847 08/18/19 2159   08/13/19 1030  ceFEPIme (MAXIPIME) 2 g in sodium chloride 0.9 % 100 mL IVPB        2 g 200 mL/hr over 30 Minutes Intravenous  Once 08/13/19 1025 08/13/19 1200   08/13/19 1030  metroNIDAZOLE (FLAGYL) IVPB 500 mg        500 mg 100 mL/hr over 60 Minutes Intravenous  Once 08/13/19 1025 08/13/19 1324    08/13/19 1030  vancomycin (VANCOCIN) IVPB 1000 mg/200 mL premix        1,000 mg 200 mL/hr over 60 Minutes Intravenous  Once 08/13/19 1025 08/13/19 1500      PRN meds: acetaminophen **OR** acetaminophen, albuterol, ipratropium-albuterol, oxyCODONE, prochlorperazine   Objective: Vitals:   08/13/19 2100 08/14/19 0754  BP: (!) 159/84 (!) 203/117  Pulse: 79 71  Resp: 18 20  Temp: 97.8 F (36.6 C) 97.6 F (36.4 C)  SpO2: 98% 98%    Intake/Output Summary (Last 24 hours) at 08/14/2019 0833 Last data filed at 08/14/2019 0531   Gross per 24 hour  Intake 79.91 ml  Output 1200 ml  Net -1120.09 ml   Filed Weights   08/13/19 0825 08/14/19 0300  Weight: 51.3 kg 54.1 kg   Weight change:  Body mass index is 23.29 kg/m.   Physical Exam: General exam: Appears calm and comfortable.  Not in physical distress Skin: No rashes, lesions or ulcers. HEENT: Atraumatic, normocephalic, supple neck, no obvious bleeding Lungs: Diminished air entry in both bases, no crackles or wheezing CVS: Regular rate and rhythm, no murmur GI/Abd soft, nontender, nondistended, bowel sound present CNS: Alert, awake monitor x3 Psychiatry: Tearful while talking about her son's death from COVID-19. Extremities: No pedal edema, no calf tenderness  Data Review: I have personally reviewed the laboratory data and studies available.  Recent Labs  Lab 08/13/19 0834 08/13/19 0844 08/14/19 0146  WBC 41.7*  --  34.5*  NEUTROABS 28.8*  --   --   HGB 9.3* 10.2* 9.7*  HCT 30.4* 30.0* 30.0*  MCV 93.0  --  89.6  PLT 145*  --  135*   Recent Labs  Lab 08/13/19 0834 08/13/19 0844 08/14/19 0146  NA 137 140 136  K 4.0 4.1 4.0  CL 104  --  98  CO2 22  --  27  GLUCOSE 112*  --  177*  BUN 18  --  30*  CREATININE 1.36*  --  1.72*  CALCIUM 8.5*  --  8.2*  MG  --   --  1.6*   Lab Results  Component Value Date   HGBA1C 5.0 03/02/2017       Component Value Date/Time   TRIG 118 01/22/2017 2258    Signed,   , MD [facility] Pager: [phone] (Secure Chat preferred). 08/14/2019            

## 2019-08-14 NOTE — Evaluation (Signed)
Occupational Therapy Evaluation Patient Details Name: Allison Vaughan MRN: [id number] DOB: [birth year] Today's Date: 08/14/2019    History of Present Illness This is a 74 year old female with past medical history of pancreatic cancer on chemotherapy, COPD, CAD, A. fib on Eliquis, hyperlipidemia who presented to the ED with 1 week of worsening shortness of breath without improvement with her usual COPD meds at home.   Clinical Impression   This 74 yo female admitted with above presents to acute OT with PLOF of being able to do her own basic ADLs and IADLs without AD. Currently pt is on O2 (2 liters). She was able to ambulate in room back and forth to door 3 times on RA with sats dropping only to 94%; O2 was reapplied at 2 liters due to pt with SOB (despite sats looking good). She will continue to benefit from acute OT without need for follow up.    Follow Up Recommendations  No OT follow up;Supervision - Intermittent    Equipment Recommendations  None recommended by OT       Precautions / Restrictions Precautions Precautions: Fall Restrictions Weight Bearing Restrictions: No      Mobility Bed Mobility Overal bed mobility: Independent                Transfers Overall transfer level: Needs assistance   Transfers: Sit to/from Stand Sit to Stand: Supervision         General transfer comment: Ambulating back and forth in room --S due to O2 tubing; without O2 tubing independent    Balance Overall balance assessment: Mild deficits observed, not formally tested                                         ADL either performed or assessed with clinical judgement   ADL Overall ADL's : Needs assistance/impaired Eating/Feeding: Independent;Sitting   Grooming: Supervision/safety;Standing   Upper Body Bathing: Supervision/ safety;Sitting   Lower Body Bathing: Supervison/ safety;Sit to/from stand   Upper Body Dressing : Supervision/safety;Sitting   Lower  Body Dressing: Supervision/safety;Sit to/from stand   Toilet Transfer: Supervision/safety;Ambulation;Regular Toilet;Grab bars   Toileting- Clothing Manipulation and Hygiene: Supervision/safety;Sit to/from stand         General ADL Comments: Educated on purse lipped breathing (in increments of 5--reassessing between each of 5 reps); also educated that sitting to shower would be more safe and less taxing on her breathing as well as any other activty she can do while seated.     Vision Patient Visual Report: No change from baseline              Pertinent Vitals/Pain Pain Assessment: No/denies pain     Hand Dominance Right   Extremity/Trunk Assessment Upper Extremity Assessment Upper Extremity Assessment: Overall WFL for tasks assessed           Communication Communication Communication: No difficulties   Cognition Arousal/Alertness: Awake/alert Behavior During Therapy: WFL for tasks assessed/performed Overall Cognitive Status: Within Functional Limits for tasks assessed                                       Home Living Family/patient expects to be discharged to:: Private residence Living Arrangements: Children Available Help at Discharge: Available 24 hours/day;Family;Friend(s) Type of Home: House Home Access: Level entry;Stairs to enter Entrance Stairs-Number of  Steps: 2 Entrance Stairs-Rails: None Home Layout: One level     Bathroom Shower/Tub: Walk-in shower;Tub/shower unit (tub shower at pateint's house, walk in shower at her dtrs house that she may go stay at short term)   Bathroom Toilet: Standard     Home Equipment: Environmental consultant - 2 wheels   Additional Comments: other family live with patient, they smoke      Prior Functioning/Environment Level of Independence: Independent  Gait / Transfers Assistance Needed: independent PTA              OT Problem List: Cardiopulmonary status limiting activity      OT Treatment/Interventions:  Self-care/ADL training;DME and/or AE instruction;Patient/family education;Balance training;Energy conservation    OT Goals(Current goals can be found in the care plan section) Acute Rehab OT Goals Patient Stated Goal: to return home OT Goal Formulation: With patient Time For Goal Achievement: 08/28/19 Potential to Achieve Goals: Good  OT Frequency: Min 2X/week              AM-PAC OT "6 Clicks" Daily Activity     Outcome Measure Help from another person eating meals?: None Help from another person taking care of personal grooming?: A Little Help from another person toileting, which includes using toliet, bedpan, or urinal?: A Little Help from another person bathing (including washing, rinsing, drying)?: A Little Help from another person to put on and taking off regular upper body clothing?: A Little Help from another person to put on and taking off regular lower body clothing?: A Little 6 Click Score: 19   End of Session Equipment Utilized During Treatment: Oxygen (2 liters and on then on RA)  Activity Tolerance: Patient tolerated treatment well Patient left: in bed;with call bell/phone within reach  OT Visit Diagnosis: Unsteadiness on feet (R26.81);Muscle weakness (generalized) (M62.81)                Time: 5361-4431 OT Time Calculation (min): 18 min Charges:  OT General Charges $OT Visit: 1 Visit OT Evaluation $OT Eval Moderate Complexity: 1 Mod  Allison Vaughan, OTR/L Acute [employer] Pager [phone] Office [phone]     Allison Vaughan 08/14/2019, 5:04 PM

## 2019-08-15 LAB — BASIC METABOLIC PANEL
Anion gap: 10 (ref 5–15)
BUN: 39 mg/dL — ABNORMAL HIGH (ref 8–23)
CO2: 26 mmol/L (ref 22–32)
Calcium: 8.4 mg/dL — ABNORMAL LOW (ref 8.9–10.3)
Chloride: 98 mmol/L (ref 98–111)
Creatinine, Ser: 1.41 mg/dL — ABNORMAL HIGH (ref 0.44–1.00)
GFR calc Af Amer: 42 mL/min — ABNORMAL LOW (ref >60)
GFR calc non Af Amer: 36 mL/min — ABNORMAL LOW (ref >60)
Glucose, Bld: 180 mg/dL — ABNORMAL HIGH (ref 70–99)
Potassium: 3.5 mmol/L (ref 3.5–5.1)
Sodium: 134 mmol/L — ABNORMAL LOW (ref 135–145)

## 2019-08-15 MED ORDER — PREDNISONE 10 MG PO TABS
ORAL_TABLET | ORAL | 0 refills | Status: DC
Start: 2019-08-15 — End: 2019-09-03

## 2019-08-15 MED ORDER — LOSARTAN POTASSIUM 25 MG PO TABS
25.0000 mg | ORAL_TABLET | Freq: Every day | ORAL | Status: DC
  Administered 2019-08-15: 25 mg via ORAL
  Filled 2019-08-15: qty 1

## 2019-08-15 MED ORDER — TRAZODONE HCL 50 MG PO TABS
50.0000 mg | ORAL_TABLET | Freq: Once | ORAL | Status: AC
  Administered 2019-08-15: 50 mg via ORAL
  Filled 2019-08-15: qty 1

## 2019-08-15 MED ORDER — METHYLPREDNISOLONE SODIUM SUCC 40 MG IJ SOLR
40.0000 mg | Freq: Two times a day (BID) | INTRAMUSCULAR | Status: DC
  Administered 2019-08-15: 40 mg via INTRAVENOUS
  Filled 2019-08-15: qty 1

## 2019-08-15 MED ORDER — CEFDINIR 300 MG PO CAPS: 300.0000 mg | ORAL_CAPSULE | Freq: Two times a day (BID) | ORAL | 0 refills | Status: AC

## 2019-08-15 MED ORDER — PREDNISONE 10 MG PO TABS: 10.0000 mg | ORAL_TABLET | Freq: Every day | ORAL | 0 refills | Status: DC

## 2019-08-15 NOTE — Discharge Summary (Signed)
Physician Discharge Summary  Allison Vaughan:[id number] DOB: 1945/11/10 DOA: 08/13/2019  PCP: Sandi Mariscal, MD  Admit date: 08/13/2019 Discharge date: 08/15/2019  Admitted From: Home Discharge disposition: Home   Code Status: Full Code  Diet Recommendation: Cardiac diet  Discharge Diagnosis:   Principal Problem:   Acute respiratory failure with hypoxia ([place]) Active Problems:   COPD exacerbation ([place])   Acute CHF (congestive heart failure) ([place])   Hypertensive urgency  History of Present Illness / Brief narrative:  Patient is a 74 year old female with PMH significant for pancreatic cancer on chemotherapy, COPD, CAD, A. fib on Eliquis, hyperlipidemia. Patient presented to the ED on 7/30 with 1 week of worsening shortness of breath, not responding to her COPD meds. She has not had her Covid vaccine. No recent travel. Reports she has a very high salt diet and even has increased her salt intake over the past week. She tolerated chemotherapy with single agent gemcitabine about 2 weeks ago.  In the ED, blood pressure was elevated to 187/90, required 4 L/min oxygen by nasal cannula. Labs with elevated creatinine 1.36 (baseline 1-1.2),alk phosphatase 238, BNP 1114, troponin 43->44, WBC 41 (previously 1.91-week ago), COVID-19 negative.  CXR with mild cardiomegaly but otherwise no active disease.   Patient was admitted to hospital service for further evaluation management.  Hospital Course:  Acute hypoxic respiratory failure -multifactorial: COPD exacerbation, heart failure exacerbation -Not on supplemental oxygen at home.    Initially required 3 to 4 L oxygen by nasal cannula.  Currently patient is ambulating inside the room without supplemental oxygen.  COPD exacerbation -Reported increased cough and wheezing -Currently on IV cefepime and doxycycline, IV Solu-Medrol 60 mg every 6 hours and inhalers. -Discharge home with five more days of Omnicef and a tapering course of  prednisone.  Continue inhalers.  Acute exacerbation of diastolic CHF Essential hypertension -BNP 1000+ JVD and orthopnea,  -Last EF 60 to 65% in June 2020 -Home meds include losartan 25 mg daily, Toprol 50 mg daily. -Does not seem to be diuretics at home. -Blood pressure fluctuating, mostly remains elevated, was as high as 183/88 this morning.  Resume losartan this morning. -Continue home meds at discharge. -While in the hospital, patient with IV Lasix as well but her creatinine got worse.  She seems euvolemic at this time and I do not think she would require further Lasix at home. -Echocardiogram with EF 60 to 95%, grade 2 diastolic dysfunction. -Follow-up with primary doctor as an outpatient for further adjustment.  Acute kidney injury on CKD 3a -Baseline GFR.  50-60.  -Presented with creatinine of 1.36, worsened to 1.72 on 7/31.  Repeat blood work this morning shows an improvement to 1.41.  Atrial fibrillation -Rate controlled on Toprol-XL.  Patient daughter states that patient's PCP stopped her Eliquis 3 weeks ago.  Reason not clear to me.  Unresectable pancreatic cancer -Follows with Dr.Feng -Currently on chemotherapy gemcitabine. She tolerated chemotherapy with single agent gemcitabine about 2 weeks ago.  Leukocytosis -WBC count elevated to 41K >> 34K -secondary to Amicar and steroids given on 7/26 for 5 days. 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  Currently counts are stable.  -Regarding the patient's daughter, she has a follow-up with Dr. Burr Medico on 8/3.  Stable for discharge to home today.   Wound care:    Subjective:  Seen and examined this morning.  Pleasant elderly Caucasian female.  Ambulating inside the room.  Not short of breath.  Mild crackles scattered bilaterally.  Wants to  go home.   Discharge Exam:   Vitals:   08/14/19 2026 08/14/19 2253 08/15/19 0500 08/15/19 0747  BP:  (!) 150/93  (!) 183/88  Pulse:  86  70  Resp:  18  16  Temp:   98.3 F (36.8 C)  98.6 F (37 C)  TempSrc:  Oral    SpO2: 97% 94%  96%  Weight:   56.3 kg   Height:        Body mass index is 24.24 kg/m.  General exam: Appears calm and comfortable.  Skin: No rashes, lesions or ulcers. HEENT: Atraumatic, normocephalic, supple neck, no obvious bleeding Lungs: Mild scattered crackles bilaterally.  Otherwise clear to auscultation.  Not in respiratory distress CVS: Regular rate and rhythm, no murmur GI/Abd soft, nontender, nondistended, bowel sound present CNS: Alert, awake, oriented x3 Psychiatry: Mood appropriate Extremities: No pedal edema, no calf tenderness  Follow ups:   Discharge Instructions    Diet - low sodium heart healthy   Complete by: As directed    Increase activity slowly   Complete by: As directed       Follow-up Information    Sandi Mariscal, MD Follow up.   Specialty: Internal Medicine Contact information: [place] [address] [phone]        Pixie Casino, MD .   Specialty: Cardiology Contact information: [place] [address] [phone]        Truitt Merle, MD Follow up.   Specialties: Hematology, Oncology Contact information: [place] [address] [phone]               Recommendations for Outpatient Follow-Up:   1. Follow-up with PCP -Per daughter, patient has an appointment tomorrow 8/2 2. Follow-up with oncology -appointment 8/3  Discharge Instructions:  Follow with Primary MD Sandi Mariscal, MD in 7 days   Get CBC/BMP/chest x-ray checked in next visit within 1 week by PCP or SNF MD ( we routinely change or add medications that can affect your baseline labs and fluid status, therefore we recommend that you get the mentioned basic workup next visit with your PCP, your PCP may decide not to get them or add new tests based on their clinical decision)  On your next visit with your PCP, please Get Medicines reviewed and  adjusted.  Please request your PCP  to go over all Hospital Tests and Procedure/Radiological results at the follow up, please get all Hospital records sent to your Prim MD by signing hospital release before you go home.  Activity: As tolerated with Full fall precautions use walker/cane & assistance as needed  For Heart failure patients - Check your Weight same time everyday, if you gain over 2 pounds, or you develop in leg swelling, experience more shortness of breath or chest pain, call your Primary MD immediately. Follow Cardiac Low Salt Diet and 1.5 lit/day fluid restriction.  If you have smoked or chewed Tobacco in the last 2 yrs please stop smoking, stop any regular Alcohol  and or any Recreational drug use.  If you experience worsening of your admission symptoms, develop shortness of breath, life threatening emergency, suicidal or homicidal thoughts you must seek medical attention immediately by calling 911 or calling your MD immediately  if symptoms less severe.  You Must read complete instructions/literature along with all the possible adverse reactions/side effects for all the Medicines you take and that have been prescribed to you. Take any new Medicines after you have  completely understood and accpet all the possible adverse reactions/side effects.   Do not drive, operate heavy machinery, perform activities at heights, swimming or participation in water activities or provide baby sitting services if your were admitted for syncope or siezures until you have seen by Primary MD or a Neurologist and advised to do so again.  Do not drive when taking Pain medications.  Do not take more than prescribed Pain, Sleep and Anxiety Medications  Wear Seat belts while driving.   Please note You were cared for by a hospitalist during your hospital stay. If you have any questions about your discharge medications or the care you received while you were in the hospital after you are discharged, you can  call the unit and asked to speak with the hospitalist on call if the hospitalist that took care of you is not available. Once you are discharged, your primary care physician will handle any further medical issues. Please note that NO REFILLS for any discharge medications will be authorized once you are discharged, as it is imperative that you return to your primary care physician (or establish a relationship with a primary care physician if you do not have one) for your aftercare needs so that they can reassess your need for medications and monitor your lab values.    Allergies as of 08/15/2019   No Known Allergies     Medication List    STOP taking these medications   apixaban 5 MG Tabs tablet Commonly known as: Eliquis   ondansetron 4 MG disintegrating tablet Commonly known as: Zofran ODT   umeclidinium bromide 62.5 MCG/INH Aepb Commonly known as: INCRUSE ELLIPTA     TAKE these medications   albuterol 108 (90 Base) MCG/ACT inhaler Commonly known as: VENTOLIN HFA Inhale 2 puffs into the lungs every 6 (six) hours as needed for wheezing.   albuterol (2.5 MG/3ML) 0.083% nebulizer solution Commonly known as: PROVENTIL Take 3 mLs (2.5 mg total) by nebulization every 2 (two) hours as needed for wheezing.   atorvastatin 10 MG tablet Commonly known as: LIPITOR Take 1 tablet (10 mg total) by mouth at bedtime. What changed: when to take this   [facility] HEADACHE PO Take 1 packet by mouth 2 (two) times daily as needed (headaches).   CALCIUM 500 PO Take 500 mg by mouth daily.   cefdinir 300 MG capsule Commonly known as: OMNICEF Take 1 capsule (300 mg total) by mouth 2 (two) times daily for 5 days.   FLUoxetine 40 MG capsule Commonly known as: PROZAC Take 40 mg by mouth every evening.   lidocaine-prilocaine cream Commonly known as: EMLA Apply to affected area as needed weekly   LORazepam 0.5 MG tablet Commonly known as: ATIVAN Take 0.5 mg by mouth at bedtime.   losartan 25 MG  tablet Commonly known as: COZAAR Take 1 tablet (25 mg total) by mouth daily. What changed: when to take this   metoprolol succinate 25 MG 24 hr tablet Commonly known as: TOPROL-XL Take 2 tablets (50 mg total) by mouth daily. Take with or immediately following a meal. What changed: when to take this   naproxen sodium 220 MG tablet Commonly known as: ALEVE Take 220 mg by mouth daily as needed (headache).   nitroGLYCERIN 0.4 MG SL tablet Commonly known as: NITROSTAT Place 1 tablet (0.4 mg total) under the tongue every 5 (five) minutes as needed for chest pain.   omeprazole 40 MG capsule Commonly known as: PRILOSEC Take 40 mg by mouth every evening.  ondansetron 8 MG tablet Commonly known as: Zofran Take 1 tablet (8 mg total) by mouth 2 (two) times daily as needed (Nausea or vomiting).   Oxycodone HCl 10 MG Tabs Take 1 tablet (10 mg total) by mouth every 6 (six) hours as needed for severe pain.   potassium chloride 10 MEQ tablet Commonly known as: KLOR-CON Take 10 mEq by mouth every evening.   predniSONE 10 MG tablet Commonly known as: DELTASONE Take 4 tablets (40 mg) daily for 2 days, then, Take 3 tablets (30 mg) daily for 2 days, then, Take 2 tablets (20 mg) daily for 2 days, then, Take 1 tablets (10 mg) daily like you were taking before. What changed: You were already taking a medication with the same name, and this prescription was added. Make sure you understand how and when to take each.   predniSONE 10 MG tablet Commonly known as: DELTASONE Take 1 tablet (10 mg total) by mouth daily with breakfast. Start taking on: August 24, 2019 What changed: These instructions start on August 24, 2019. If you are unsure what to do until then, ask your doctor or other care provider.   prochlorperazine 10 MG tablet Commonly known as: COMPAZINE Take 1 tablet (10 mg total) by mouth every 6 (six) hours as needed (Nausea or vomiting).   Vitamin D3 50 MCG (2000 UT) capsule Take 2,000  Units by mouth daily.       Time coordinating discharge: 35 minutes  The results of significant diagnostics from this hospitalization (including imaging, microbiology, ancillary and laboratory) are listed below for reference.    Procedures and Diagnostic Studies:   DG Chest 2 View  Result Date: 08/13/2019 CLINICAL DATA:  Shortness of breath EXAM: CHEST - 2 VIEW COMPARISON:  07/10/2019 FINDINGS: Right Port-A-Cath in place with the tip at the cavoatrial junction. Cardiomegaly, aortic atherosclerosis. Lungs are clear. No effusions or acute bony abnormality. IMPRESSION: Mild cardiomegaly.  No active disease. Electronically Signed   By: Rolm Baptise M.D.   On: 08/13/2019 08:49   ECHOCARDIOGRAM COMPLETE  Result Date: 08/13/2019    ECHOCARDIOGRAM REPORT   Patient Name:   Allison Vaughan Date of Exam: 08/13/2019 Medical Rec #:  [id number]          Height:       60.0 in Accession #:    [id number]         Weight:       113.0 lb Date of Birth:  11-Feb-1945          BSA:          1.464 m Patient Age:    39 years           BP:           194/92 mmHg Patient Gender: F                  HR:           76 bpm. Exam Location:  Inpatient Procedure: 2D Echo Indications:    acute diastolic chf 623.76  History:        Patient has prior history of Echocardiogram examinations, most                 recent 07/09/2018. Cardiomyopathy, CAD, COPD; Risk                 Factors:Dyslipidemia.  Sonographer:    Johny Chess Referring Phys: [id number] [place]  1. Left ventricular ejection fraction, by  estimation, is 60 to 65%. The left ventricle has normal function. The left ventricle has no regional wall motion abnormalities. Left ventricular diastolic parameters are consistent with Grade II diastolic dysfunction (pseudonormalization). Elevated left ventricular end-diastolic pressure.  2. Right ventricular systolic function is normal. The right ventricular size is normal. There is mildly elevated pulmonary artery  systolic pressure.  3. Left atrial size was moderately dilated.  4. The mitral valve is normal in structure. Mild mitral valve regurgitation. No evidence of mitral stenosis.  5. The aortic valve is normal in structure. Aortic valve regurgitation is trivial. No aortic stenosis is present.  6. The inferior vena cava is normal in size with greater than 50% respiratory variability, suggesting right atrial pressure of 3 mmHg. FINDINGS  Left Ventricle: Left ventricular ejection fraction, by estimation, is 60 to 65%. The left ventricle has normal function. The left ventricle has no regional wall motion abnormalities. The left ventricular internal cavity size was normal in size. There is  no left ventricular hypertrophy. Left ventricular diastolic parameters are consistent with Grade II diastolic dysfunction (pseudonormalization). Elevated left ventricular end-diastolic pressure. Right Ventricle: The right ventricular size is normal. No increase in right ventricular wall thickness. Right ventricular systolic function is normal. There is mildly elevated pulmonary artery systolic pressure. The tricuspid regurgitant velocity is 3.12  m/s, and with an assumed right atrial pressure of 3 mmHg, the estimated right ventricular systolic pressure is 46.9 mmHg. Left Atrium: Left atrial size was moderately dilated. Right Atrium: Right atrial size was normal in size. Pericardium: There is no evidence of pericardial effusion. Mitral Valve: The mitral valve is normal in structure. Normal mobility of the mitral valve leaflets. Mild mitral valve regurgitation. No evidence of mitral valve stenosis. Tricuspid Valve: The tricuspid valve is normal in structure. Tricuspid valve regurgitation is mild . No evidence of tricuspid stenosis. Aortic Valve: The aortic valve is normal in structure. Aortic valve regurgitation is trivial. No aortic stenosis is present. Pulmonic Valve: The pulmonic valve was normal in structure. Pulmonic valve regurgitation  is trivial. No evidence of pulmonic stenosis. Aorta: The aortic root is normal in size and structure. Venous: The inferior vena cava is normal in size with greater than 50% respiratory variability, suggesting right atrial pressure of 3 mmHg. IAS/Shunts: The interatrial septum appears to be lipomatous. No atrial level shunt detected by color flow Doppler.  LEFT VENTRICLE PLAX 2D LVIDd:         5.10 cm  Diastology LVIDs:         3.60 cm  LV e' lateral:   9.57 cm/s LV PW:         1.00 cm  LV E/e' lateral: 10.8 LV IVS:        0.70 cm  LV e' medial:    7.07 cm/s LVOT diam:     1.85 cm  LV E/e' medial:  14.6 LV SV:         55 LV SV Index:   38 LVOT Area:     2.69 cm  RIGHT VENTRICLE             IVC RV S prime:     14.40 cm/s  IVC diam: 2.00 cm LEFT ATRIUM             Index       RIGHT ATRIUM           Index LA diam:        4.40 cm 3.00 cm/m  RA Area:  15.40 cm LA Vol (A2C):   58.2 ml 39.74 ml/m RA Volume:   40.10 ml  27.38 ml/m LA Vol (A4C):   59.2 ml 40.42 ml/m LA Biplane Vol: 63.3 ml 43.22 ml/m  AORTIC VALVE LVOT Vmax:   86.60 cm/s LVOT Vmean:  54.900 cm/s LVOT VTI:    0.205 m  AORTA Ao Root diam: 2.50 cm Ao Asc diam:  2.80 cm MITRAL VALVE                TRICUSPID VALVE MV Area (PHT): 5.27 cm     TR Peak grad:   38.9 mmHg MV Decel Time: 144 msec     TR Vmax:        312.00 cm/s MV E velocity: 103.00 cm/s MV A velocity: 90.70 cm/s   SHUNTS MV E/A ratio:  1.14         Systemic VTI:  0.20 m                             Systemic Diam: 1.85 cm Fransico Him MD Electronically signed by Fransico Him MD Signature Date/Time: 08/13/2019/2:49:15 PM    Final      Labs:   Basic Metabolic Panel: Recent Labs  Lab 08/13/19 0834 08/13/19 0834 08/13/19 0844 08/13/19 0844 08/14/19 0146 08/15/19 0205  NA 137  --  140  --  136 134*  K 4.0   < > 4.1   < > 4.0 3.5  CL 104  --   --   --  98 98  CO2 22  --   --   --  27 26  GLUCOSE 112*  --   --   --  177* 180*  BUN 18  --   --   --  30* 39*  CREATININE 1.36*  --    --   --  1.72* 1.41*  CALCIUM 8.5*  --   --   --  8.2* 8.4*  MG  --   --   --   --  1.6*  --    < > = values in this interval not displayed.   GFR Estimated Creatinine Clearance: 26.7 mL/min (A) (by C-G formula based on SCr of 1.41 mg/dL (H)). Liver Function Tests: Recent Labs  Lab 08/13/19 1054  AST 37  ALT 18  ALKPHOS 238*  BILITOT 0.7  PROT 6.2*  ALBUMIN 3.5   No results for input(s): LIPASE, AMYLASE in the last 168 hours. No results for input(s): AMMONIA in the last 168 hours. Coagulation profile Recent Labs  Lab 08/13/19 1054  INR 1.0    CBC: Recent Labs  Lab 08/13/19 0834 08/13/19 0844 08/14/19 0146  WBC 41.7*  --  34.5*  NEUTROABS 28.8*  --   --   HGB 9.3* 10.2* 9.7*  HCT 30.4* 30.0* 30.0*  MCV 93.0  --  89.6  PLT 145*  --  135*   Cardiac Enzymes: No results for input(s): CKTOTAL, CKMB, CKMBINDEX, TROPONINI in the last 168 hours. BNP: Invalid input(s): POCBNP CBG: No results for input(s): GLUCAP in the last 168 hours. D-Dimer No results for input(s): DDIMER in the last 72 hours. Hgb A1c No results for input(s): HGBA1C in the last 72 hours. Lipid Profile No results for input(s): CHOL, HDL, LDLCALC, TRIG, CHOLHDL, LDLDIRECT in the last 72 hours. Thyroid function studies No results for input(s): TSH, T4TOTAL, T3FREE, THYROIDAB in the last 72 hours.  Invalid input(s): FREET3 Anemia work  up No results for input(s): VITAMINB12, FOLATE, FERRITIN, TIBC, IRON, RETICCTPCT in the last 72 hours. Microbiology Recent Results (from the past 240 hour(s))  SARS Coronavirus 2 by RT PCR (hospital order, performed in [facility] hospital lab) Nasopharyngeal Nasopharyngeal Swab     Status: None   Collection Time: 08/13/19  8:36 AM   Specimen: Nasopharyngeal Swab  Result Value Ref Range Status   SARS Coronavirus 2 NEGATIVE NEGATIVE Final    Comment: (NOTE) SARS-CoV-2 target nucleic acids are NOT DETECTED.  The SARS-CoV-2 RNA is generally detectable in upper and  lower respiratory specimens during the acute phase of infection. The lowest concentration of SARS-CoV-2 viral copies this assay can detect is 250 copies / mL. A negative result does not preclude SARS-CoV-2 infection and should not be used as the sole basis for treatment or other patient management decisions.  A negative result may occur with improper specimen collection / handling, submission of specimen other than nasopharyngeal swab, presence of viral mutation(s) within the areas targeted by this assay, and inadequate number of viral copies (<250 copies / mL). A negative result must be combined with clinical observations, patient history, and epidemiological information.  Fact Sheet for Patients:   StrictlyIdeas.no  Fact Sheet for Healthcare Providers: BankingDealers.co.za  This test is not yet approved or  cleared by the [place] FDA and has been authorized for detection and/or diagnosis of SARS-CoV-2 by FDA under an Emergency Use Authorization (EUA).  This EUA will remain in effect (meaning this test can be used) for the duration of the COVID-19 declaration under Section 564(b)(1) of the Act, 21 U.S.C. section 360bbb-3(b)(1), unless the authorization is terminated or revoked sooner.  Performed at [facility], [place] [address], [place], [place] [address]   Blood culture (routine single)     Status: None (Preliminary result)   Collection Time: 08/13/19 10:57 AM   Specimen: BLOOD  Result Value Ref Range Status   Specimen Description BLOOD RIGHT ANTECUBITAL  Final   Special Requests   Final    BOTTLES DRAWN AEROBIC AND ANAEROBIC Blood Culture adequate volume   Culture   Final    NO GROWTH 2 DAYS Performed at [facility], [place] [address], [place], [place] [address]    Report Status PENDING  Incomplete  Urine culture     Status: None   Collection Time: 08/13/19 12:49 PM   Specimen: In/Out Cath Urine  Result Value  Ref Range Status   Specimen Description IN/OUT CATH URINE  Final   Special Requests NONE  Final   Culture   Final    NO GROWTH Performed at [facility], [place] [address], [place], [place] [address]    Report Status 08/14/2019 FINAL  Final     Signed: Marlowe Aschoff Geryl Dohn  [facility] 08/15/2019, 10:04 AM

## 2019-08-17 ENCOUNTER — Encounter: Payer: Self-pay | Admitting: Pharmacy Technician

## 2019-08-17 ENCOUNTER — Telehealth: Payer: Self-pay

## 2019-08-17 NOTE — Telephone Encounter (Signed)
I spoke with [name].  She said Ms Cozart is still very tired and her legs hurt.  She was agreeable to the scheduled changes.  She states that Ms Jansma has an appt with her PCP today.

## 2019-08-17 NOTE — Telephone Encounter (Signed)
I called and spoke with Debroah Baller (Ms. Deveney daughter) to ask how Ms. Gillaspie was doing since being home from the hospital and Debroah Baller states that Ms. Hill lives with other daughter and she was having some difficultness yesterday but has not heard from them today and directed me to call other daughter [name] to have her provide me with update on Ms. Monia Pouch. [phone]   Called and unable to reach [name] and I received no answer and VM box is not set up.

## 2019-08-17 NOTE — Progress Notes (Signed)
Patient has been approved for drug assistance by [employer] for [employer]. The enrollment period is from 08/09/19-08/07/20 based on denial. First DOS covered is 08/06/19 with retro.

## 2019-08-18 ENCOUNTER — Inpatient Hospital Stay: Payer: Medicare HMO | Admitting: Genetic Counselor

## 2019-08-18 LAB — CULTURE, BLOOD (SINGLE)
Culture: NO GROWTH
Special Requests: ADEQUATE

## 2019-08-20 ENCOUNTER — Ambulatory Visit: Payer: Medicare HMO

## 2019-08-20 ENCOUNTER — Other Ambulatory Visit: Payer: Medicare HMO

## 2019-08-20 ENCOUNTER — Ambulatory Visit: Payer: Medicare HMO | Admitting: Physician Assistant

## 2019-08-22 ENCOUNTER — Emergency Department (HOSPITAL_COMMUNITY): Payer: Medicare HMO

## 2019-08-22 ENCOUNTER — Other Ambulatory Visit: Payer: Self-pay

## 2019-08-22 ENCOUNTER — Emergency Department (HOSPITAL_COMMUNITY)
Admission: EM | Admit: 2019-08-22 | Discharge: 2019-08-22 | Disposition: A | Discharge status: Home / Self Care | Payer: Medicare HMO | Source: Home / Self Care | Attending: Emergency Medicine | Admitting: Emergency Medicine

## 2019-08-22 DIAGNOSIS — J449 Chronic obstructive pulmonary disease, unspecified: Secondary | ICD-10-CM | POA: Insufficient documentation

## 2019-08-22 DIAGNOSIS — R4189 Other symptoms and signs involving cognitive functions and awareness: Secondary | ICD-10-CM

## 2019-08-22 DIAGNOSIS — R464 Slowness and poor responsiveness: Secondary | ICD-10-CM | POA: Insufficient documentation

## 2019-08-22 DIAGNOSIS — T424X5A Adverse effect of benzodiazepines, initial encounter: Secondary | ICD-10-CM | POA: Diagnosis not present

## 2019-08-22 DIAGNOSIS — I119 Hypertensive heart disease without heart failure: Secondary | ICD-10-CM | POA: Insufficient documentation

## 2019-08-22 DIAGNOSIS — Z20822 Contact with and (suspected) exposure to covid-19: Secondary | ICD-10-CM | POA: Insufficient documentation

## 2019-08-22 DIAGNOSIS — R0602 Shortness of breath: Secondary | ICD-10-CM | POA: Diagnosis not present

## 2019-08-22 LAB — COMPREHENSIVE METABOLIC PANEL
ALT: 12 U/L (ref 0–44)
AST: 31 U/L (ref 15–41)
Albumin: 3.2 g/dL — ABNORMAL LOW (ref 3.5–5.0)
Alkaline Phosphatase: 97 U/L (ref 38–126)
Anion gap: 11 (ref 5–15)
BUN: 16 mg/dL (ref 8–23)
CO2: 25 mmol/L (ref 22–32)
Calcium: 8.5 mg/dL — ABNORMAL LOW (ref 8.9–10.3)
Chloride: 100 mmol/L (ref 98–111)
Creatinine, Ser: 1.32 mg/dL — ABNORMAL HIGH (ref 0.44–1.00)
GFR calc Af Amer: 45 mL/min — ABNORMAL LOW (ref >60)
GFR calc non Af Amer: 39 mL/min — ABNORMAL LOW (ref >60)
Glucose, Bld: 161 mg/dL — ABNORMAL HIGH (ref 70–99)
Potassium: 4.3 mmol/L (ref 3.5–5.1)
Sodium: 136 mmol/L (ref 135–145)
Total Bilirubin: 0.4 mg/dL (ref 0.3–1.2)
Total Protein: 5.4 g/dL — ABNORMAL LOW (ref 6.5–8.1)

## 2019-08-22 LAB — CBC WITH DIFFERENTIAL/PLATELET
Abs Immature Granulocytes: 0.4 10*3/uL — ABNORMAL HIGH (ref 0.00–0.07)
Basophils Absolute: 0.1 10*3/uL (ref 0.0–0.1)
Basophils Relative: 1 %
Eosinophils Absolute: 0.1 10*3/uL (ref 0.0–0.5)
Eosinophils Relative: 1 %
HCT: 33.6 % — ABNORMAL LOW (ref 36.0–46.0)
Hemoglobin: 10 g/dL — ABNORMAL LOW (ref 12.0–15.0)
Immature Granulocytes: 4 %
Lymphocytes Relative: 20 %
Lymphs Abs: 2 10*3/uL (ref 0.7–4.0)
MCH: 28.7 pg (ref 26.0–34.0)
MCHC: 29.8 g/dL — ABNORMAL LOW (ref 30.0–36.0)
MCV: 96.6 fL (ref 80.0–100.0)
Monocytes Absolute: 1.1 10*3/uL — ABNORMAL HIGH (ref 0.1–1.0)
Monocytes Relative: 11 %
Neutro Abs: 6.5 10*3/uL (ref 1.7–7.7)
Neutrophils Relative %: 63 %
Platelets: 222 10*3/uL (ref 150–400)
RBC: 3.48 MIL/uL — ABNORMAL LOW (ref 3.87–5.11)
RDW: 18.5 % — ABNORMAL HIGH (ref 11.5–15.5)
WBC: 10 10*3/uL (ref 4.0–10.5)
nRBC: 0.2 % (ref 0.0–0.2)

## 2019-08-22 LAB — ETHANOL: Alcohol, Ethyl (B): <10 mg/dL (ref <10)

## 2019-08-22 LAB — ACETAMINOPHEN LEVEL: Acetaminophen (Tylenol), Serum: <10 ug/mL — ABNORMAL LOW (ref 10–30)

## 2019-08-22 LAB — RAPID URINE DRUG SCREEN, HOSP PERFORMED
Amphetamines: NOT DETECTED
Barbiturates: NOT DETECTED
Benzodiazepines: POSITIVE — AB
Cocaine: NOT DETECTED
Opiates: NOT DETECTED
Tetrahydrocannabinol: NOT DETECTED

## 2019-08-22 LAB — SARS CORONAVIRUS 2 BY RT PCR (HOSPITAL ORDER, PERFORMED IN ~~LOC~~ HOSPITAL LAB): SARS Coronavirus 2: NEGATIVE

## 2019-08-22 LAB — SALICYLATE LEVEL: Salicylate Lvl: <7 mg/dL — ABNORMAL LOW (ref 7.0–30.0)

## 2019-08-22 NOTE — ED Triage Notes (Signed)
Patient arrives via EMS from home due to having a suspected overdose. Per ems, the patient was outside with her family when she lost her balance and collapsed on the ground. On the scene, EMS noted that the patient had pinpoint pupils, agonal breathing, and clenched posturing. EMS have 2mg  of nasal narcan with no response. Patient was then given IV narcan, which is when the patient finally regained consciousness.   VITALS:  178/100 BP 70 HR

## 2019-08-22 NOTE — ED Provider Notes (Signed)
Emergency Department Provider Note   I have reviewed the triage vital signs and the nursing notes.   HISTORY  Chief Complaint Drug Overdose   HPI Allison Vaughan is a 74 y.o. female with PMH reviewed below presents to the ED after being found unresponsive.  According to EMS they arrived after they were called by family.  The patient was apparently staggering around and then lost her balance and fell to the ground.  She was unresponsive and breathing only several times per minute.  She was found to be hypoxemic with pinpoint pupils.  EMS administered 2 mg of intranasal Narcan with no response.  They established IV access and gave 0.5 mg of additional Narcan and the patient regained consciousness with increased respiratory rate.  While resuscitating the patient EMS did note that she had an episode where she had a clenched jaw with some myoclonic jerking type movements.  These stopped after breathing improved and the patient did not require any seizure medicines.   Patient tells me that she does not recall the incident.  She remembers going over to her cousin's house but denies taking more than her prescribed dose of oxycodone.  When asked about using any illegal drugs or drinking alcohol she tells me "not that I know of." Denies pain at this time.   Patient did have recent admission for COPD exacerbation related to noncompliance with her COPD medications.  Patient does have history of atrial fibrillation and is anticoagulated.   Past Medical History:  Diagnosis Date  . Accidental drug overdose   . Acute encephalopathy   . Acute on chronic respiratory failure with hypoxia ([place]) 03/01/2017  . Acute renal failure ([place]) 04/16/2014  . AKI (acute kidney injury) ([place]) 04/30/2014  . Altered mental status 06/27/2012  . Anxiety   . Atrial fibrillation ([place])   . CAD (coronary artery disease)   . CAP (community acquired pneumonia) 04/18/2014  . Cardiomyopathy ([place])   . Chronic hypoxemic respiratory  failure ([place]) 06/26/2012  . Chronic pain   . COPD exacerbation ([place]) 06/26/2012  . COPD with acute exacerbation ([place]) 03/01/2017  . DEPRESSIVE DISORDER, NOS 03/13/2006   Qualifier: Diagnosis of  By: Damita Dunnings MD, Phillip Heal    . Edema, peripheral   . Essential hypertension 04/30/2014  . Fall at home, initial encounter 03/01/2017  . GASTROESOPHAGEAL REFLUX, NO ESOPHAGITIS 03/13/2006   Qualifier: Diagnosis of  By: Damita Dunnings MD, Phillip Heal    . HYPERCHOLESTEROLEMIA 03/13/2006   Qualifier: Diagnosis of  By: Damita Dunnings MD, Phillip Heal    . Hyperlipidemia   . HYPERTENSION, BENIGN SYSTEMIC 03/13/2006   Qualifier: Diagnosis of  By: Damita Dunnings MD, Phillip Heal    . Hypokalemia 04/30/2014  . Hyponatremia 04/30/2014  . Hypotension 04/30/2014  . Hypoxemia 06/26/2012  . Hypoxia 03/26/2018  . Irritable bowel syndrome 03/13/2006   Qualifier: Diagnosis of  By: Damita Dunnings MD, Phillip Heal    . Lung nodule 04/16/2014  . MENOPAUSAL SYNDROME 03/13/2006   Qualifier: Diagnosis of  By: Damita Dunnings MD, Phillip Heal    . Mood disorder [facility]) 03/13/2006   Qualifier: Diagnosis of  By: Damita Dunnings MD, Phillip Heal    . 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 Nausea 04/30/2014  . Nausea vomiting and diarrhea 04/16/2014  . OSTEOARTHRITIS, MULTI SITES 03/13/2006   Qualifier: Diagnosis of  By: Damita Dunnings MD, Phillip Heal    . Pulmonary nodule 04/30/2014  . Respiratory failure ([place]) 01/22/2017  . TOBACCO DEPENDENCE 03/13/2006   Qualifier: Diagnosis of  By: Damita Dunnings MD, Phillip Heal    . Weight  loss     Patient Active Problem List   Diagnosis Date Noted  . COPD exacerbation ([place]) 08/13/2019  . Acute CHF (congestive heart failure) ([place]) 08/13/2019  . Hypertensive urgency 08/13/2019  . Allergy reaction, arthritis 08/09/2019  . Port-A-Cath in place 07/30/2019  . Goals of care, counseling/discussion 07/13/2019  . 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 Pancreatic neoplasm 06/24/2019  . Prolonged QT interval 12/22/2018  . Hypomagnesemia 12/22/2018  . Constipation 12/22/2018  . Dilation  of pancreatic duct 12/22/2018  . Altered mental status 07/09/2018  . Acute lower UTI 07/09/2018  . Anemia 07/09/2018  . Bronchiolitis 07/09/2018  . Bronchitis 07/09/2018  . Acute metabolic encephalopathy 51/02/5850  . Contusion of right wrist   . Fall   . Bacteria in urine   . Mild renal insufficiency 03/26/2018  . 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 Atrial fibrillation with RVR ([place]) 03/01/2017  . Acute on chronic respiratory failure with hypoxia ([place]) 03/01/2017  . Fall at home, initial encounter 03/01/2017  . Accidental drug overdose   . Acute respiratory failure with hypoxia ([place]) 01/22/2017  . Acute encephalopathy   . AKI (acute kidney injury) ([place]) 04/30/2014  . Pulmonary nodule 04/30/2014  . Essential hypertension 04/30/2014  . Hypokalemia 04/30/2014  . Hyponatremia 04/30/2014  . Hypotension 04/30/2014  . Nausea & vomiting 04/30/2014  . Lung nodule 04/16/2014  . Chronic hypoxemic respiratory failure ([place]) 06/26/2012  . COPD (chronic obstructive pulmonary disease) ([place]) 06/26/2012  . HYPERCHOLESTEROLEMIA 03/13/2006  . TOBACCO DEPENDENCE 03/13/2006  . Mood disorder ([place]) 03/13/2006  . HYPERTENSION, BENIGN SYSTEMIC 03/13/2006  . GASTROESOPHAGEAL REFLUX, NO ESOPHAGITIS 03/13/2006  . IRRITABLE BOWEL SYNDROME 03/13/2006  . MENOPAUSAL SYNDROME 03/13/2006  . OSTEOARTHRITIS, MULTI SITES 03/13/2006    Past Surgical History:  Procedure Laterality Date  . ABDOMINAL HYSTERECTOMY    . ESOPHAGOGASTRODUODENOSCOPY N/A 07/08/2019   Procedure: ESOPHAGOGASTRODUODENOSCOPY (EGD);  Surgeon: Milus Banister, MD;  Location: [facility];  Service: Endoscopy;  Laterality: N/A;  . ESOPHAGOGASTRODUODENOSCOPY (EGD) WITH PROPOFOL N/A 05/05/2019   Procedure: ESOPHAGOGASTRODUODENOSCOPY (EGD) WITH PROPOFOL;  Surgeon: Arta Silence, MD;  Location: [facility];  Service: Endoscopy;  Laterality: N/A;  . EUS N/A 07/08/2019   Procedure: UPPER ENDOSCOPIC ULTRASOUND (EUS) RADIAL;  Surgeon: Milus Banister, MD;  Location: [facility];  Service: Endoscopy;  Laterality: N/A;  . EYE SURGERY    . FIDUCIAL MARKER PLACEMENT N/A 07/08/2019   Procedure: FIDUCIAL MARKER PLACEMENT;  Surgeon: Milus Banister, MD;  Location: [facility];  Service: Endoscopy;  Laterality: N/A;  . FINE NEEDLE ASPIRATION N/A 05/05/2019   Procedure: FINE NEEDLE ASPIRATION (FNA) LINEAR;  Surgeon: Arta Silence, MD;  Location: [facility];  Service: Endoscopy;  Laterality: N/A;  . FINE NEEDLE ASPIRATION N/A 07/08/2019   Procedure: FINE NEEDLE ASPIRATION (FNA) LINEAR;  Surgeon: Milus Banister, MD;  Location: [facility];  Service: Endoscopy;  Laterality: N/A;  . IR IMAGING GUIDED PORT INSERTION  07/27/2019  . ROTATOR CUFF REPAIR Right 2012  . UPPER ESOPHAGEAL ENDOSCOPIC ULTRASOUND (EUS) N/A 05/05/2019   Procedure: UPPER ESOPHAGEAL ENDOSCOPIC ULTRASOUND (EUS) with FNA;  Surgeon: Arta Silence, MD;  Location: [facility];  Service: Endoscopy;  Laterality: N/A;    Allergies Patient has no known allergies.  Family History  Problem Relation Age of Onset  . CAD Father   . Lung cancer Brother   . Cancer Brother        lung ca  . Cancer Brother  possible pancreas cancer   . Healthy Daughter   . Healthy Daughter   . Cancer Maternal Grandmother        breast  . Cancer Paternal Grandmother        breast    Social History Social History   Tobacco Use  . Smoking status: Current Every Day Smoker    Packs/day: 1.50    Years: 50.00    Pack years: 75.00    Types: Cigarettes  . Smokeless tobacco: Never Used  . Tobacco comment: states she is trying to quit  Vaping Use  . Vaping Use: Never used  Substance Use Topics  . Alcohol use: Not Currently    Comment: drank alcohol daily x20 years, quit 10 years ago  . Drug use: No    Review of Systems  Constitutional: No fever/chills. Unresponsive episode.  Eyes: No visual changes. ENT: No sore throat. Cardiovascular: Denies chest pain. Respiratory:  Positive shortness of breath. Gastrointestinal: No abdominal pain.  No nausea, no vomiting.  No diarrhea.  No constipation. Genitourinary: Negative for dysuria. Musculoskeletal: Negative for back pain. Skin: Negative for rash. Neurological: Negative for headaches, focal weakness or numbness.  10-point ROS otherwise negative.  ____________________________________________   PHYSICAL EXAM:  VITAL SIGNS: Vitals:   08/22/19 1432 08/22/19 1611  BP: (!) 167/83 (!) 174/68  Pulse: 73 70  Resp: 18 18  Temp: 98.1 F (36.7 C) 97.6 F (36.4 C)  SpO2: 97% 97%    Constitutional: Alert and oriented. Well appearing and in no acute distress. Eyes: Conjunctivae are normal. Pupils are 52mm and reactive.  Head: Atraumatic. Nose: No congestion/rhinnorhea. Mouth/Throat: Mucous membranes are moist.   Cardiovascular: Normal rate, regular rhythm. Good peripheral circulation. Grossly normal heart sounds.   Respiratory: Normal respiratory effort.  No retractions. Lungs CTAB. Gastrointestinal: Soft and nontender. No distention.  Musculoskeletal: No lower extremity tenderness nor edema. No gross deformities of extremities. Neurologic:  Normal speech and language. No gross focal neurologic deficits are appreciated.  Skin:  Skin is warm, dry and intact. No rash noted. ____________________________________________   LABS (all labs ordered are listed, but only abnormal results are displayed)  Labs Reviewed  COMPREHENSIVE METABOLIC PANEL - Abnormal; Notable for the following components:      Result Value   Glucose, Bld 161 (*)    Creatinine, Ser 1.32 (*)    Calcium 8.5 (*)    Total Protein 5.4 (*)    Albumin 3.2 (*)    GFR calc non Af Amer 39 (*)    GFR calc Af Amer 45 (*)    All other components within normal limits  ACETAMINOPHEN LEVEL - Abnormal; Notable for the following components:   Acetaminophen (Tylenol), Serum <10 (*)    All other components within normal limits  SALICYLATE LEVEL -  Abnormal; Notable for the following components:   Salicylate Lvl <6.2 (*)    All other components within normal limits  CBC WITH DIFFERENTIAL/PLATELET - Abnormal; Notable for the following components:   RBC 3.48 (*)    Hemoglobin 10.0 (*)    HCT 33.6 (*)    MCHC 29.8 (*)    RDW 18.5 (*)    Monocytes Absolute 1.1 (*)    Abs Immature Granulocytes 0.40 (*)    All other components within normal limits  RAPID URINE DRUG SCREEN, HOSP PERFORMED - Abnormal; Notable for the following components:   Benzodiazepines POSITIVE (*)    All other components within normal limits  SARS CORONAVIRUS 2 BY RT PCR [facility]  ORDER, PERFORMED IN [facility] LAB)  ETHANOL   ____________________________________________  EKG   EKG Interpretation  Date/Time:  Sunday August 22 2019 14:26:02 EDT Ventricular Rate:  67 PR Interval:    QRS Duration: 94 QT Interval:  449 QTC Calculation: 474 R Axis:   65 Text Interpretation: Sinus rhythm No STEMI Confirmed by Nanda Quinton [phone]) on 08/22/2019 2:28:53 PM       ____________________________________________  RADIOLOGY  CT Head Wo Contrast  Result Date: 08/22/2019 CLINICAL DATA:  Mental status change. EXAM: CT HEAD WITHOUT CONTRAST TECHNIQUE: Contiguous axial images were obtained from the base of the skull through the vertex without intravenous contrast. COMPARISON:  None. FINDINGS: Brain: No acute intracranial hemorrhage. No focal mass lesion. No CT evidence of acute infarction. No midline shift or mass effect. No hydrocephalus. Basilar cisterns are patent. There are patchy periventricular and subcortical white matter hypodensities. Generalized cortical atrophy. Vascular: No hyperdense vessel or unexpected calcification. Skull: Normal. Negative for fracture or focal lesion. Sinuses/Orbits: Chronic inspissated material filling the RIGHT maxillary sinus. Orbits are normal. Other: None. IMPRESSION: 1. No acute intracranial findings. 2. Chronic patchy  subcortical white matter hypodensities. Electronically Signed   By: Suzy Bouchard M.D.   On: 08/22/2019 15:07   DG Chest Portable 1 View  Result Date: 08/22/2019 CLINICAL DATA:  Patient arrives via EMS from home due to having a suspected overdose. Per ems, the patient was outside with her family when she lost her balance and collapsed on the ground. On the scene, EMS noted that the patient had pinpoint p.*comment was truncated*Unresponsive EXAM: PORTABLE CHEST 1 VIEW COMPARISON:  08/13/2019 FINDINGS: Port in the anterior chest wall with tip in distal SVC. Normal cardiac silhouette. Lungs are clear. No effusion, infiltrate or pneumothorax. IMPRESSION: No acute cardiopulmonary process. Electronically Signed   By: Suzy Bouchard M.D.   On: 08/22/2019 15:01    ____________________________________________   PROCEDURES  Procedure(s) performed:   Procedures  None  ____________________________________________   INITIAL IMPRESSION / ASSESSMENT AND PLAN / ED COURSE  Pertinent labs & imaging results that were available during my care of the patient were reviewed by me and considered in my medical decision making (see chart for details).   Patient presents to the emergency department after unresponsive episode.  She had very low respiratory rate with pinpoint pupils on arrival and responded ultimately to IV Narcan with EMS.  Some question of seizure-like activity per EMS.  On further conversation this seems more consistent with myoclonic jerks likely related to hypoxia rather than seizure.  Patient does have cancer history so do plan on CT imaging of the head and further ED observation.  No focal deficits on neurologic exam.  Patient does not appear postictal.  No stigmata of seizure.   Labs pending. Care transferred to Dr. Wilson Singer.  ____________________________________________  FINAL CLINICAL IMPRESSION(S) / ED DIAGNOSES  Final diagnoses:  Episode of unresponsiveness    Note:  This document  was prepared using Dragon voice recognition software and may include unintentional dictation errors.  Nanda Quinton, MD, [facility] Emergency Medicine    Piper Albro, [facility], MD 08/23/19 [phone]

## 2019-08-22 NOTE — ED Notes (Signed)
Patient given discharge instructions. Questions were answered. Patient verbalized understanding of discharge instructions and care at home.  

## 2019-08-22 NOTE — ED Provider Notes (Signed)
74yF with episode of unresponsiveness earlier today. Now back to baseline and would like to go home. It struck me as odd that both her and her daughter insisted this couldn't be medication related accidental or otherwise before I could even introduce myself to them. Regardless, she has no complaints currently. Answering questions appropriately. She has no recollection of what may have happened earlier nor does she seem particularly concerned. Requesting to go home. W/u has been fairly unrevealing. Will DC with outp FU.    Virgel Manifold, MD 08/22/19 [phone]

## 2019-08-23 ENCOUNTER — Inpatient Hospital Stay: Payer: Medicare HMO | Attending: Nurse Practitioner

## 2019-08-23 DIAGNOSIS — Z9221 Personal history of antineoplastic chemotherapy: Secondary | ICD-10-CM | POA: Insufficient documentation

## 2019-08-23 DIAGNOSIS — C251 Malignant neoplasm of body of pancreas: Secondary | ICD-10-CM | POA: Insufficient documentation

## 2019-08-24 ENCOUNTER — Inpatient Hospital Stay (HOSPITAL_COMMUNITY)
Admission: EM | Admit: 2019-08-24 | Discharge: 2019-08-29 | DRG: 917 | Disposition: A | Discharge status: Home / Self Care | Payer: Medicare HMO | Attending: Internal Medicine | Admitting: Internal Medicine

## 2019-08-24 ENCOUNTER — Other Ambulatory Visit: Payer: Self-pay

## 2019-08-24 ENCOUNTER — Encounter (HOSPITAL_COMMUNITY): Payer: Self-pay | Admitting: Student

## 2019-08-24 ENCOUNTER — Emergency Department (HOSPITAL_COMMUNITY): Payer: Medicare HMO

## 2019-08-24 DIAGNOSIS — Z66 Do not resuscitate: Secondary | ICD-10-CM | POA: Diagnosis not present

## 2019-08-24 DIAGNOSIS — Z90710 Acquired absence of both cervix and uterus: Secondary | ICD-10-CM

## 2019-08-24 DIAGNOSIS — Z79891 Long term (current) use of opiate analgesic: Secondary | ICD-10-CM

## 2019-08-24 DIAGNOSIS — Z515 Encounter for palliative care: Secondary | ICD-10-CM

## 2019-08-24 DIAGNOSIS — Z7982 Long term (current) use of aspirin: Secondary | ICD-10-CM

## 2019-08-24 DIAGNOSIS — I429 Cardiomyopathy, unspecified: Secondary | ICD-10-CM | POA: Diagnosis present

## 2019-08-24 DIAGNOSIS — G9341 Metabolic encephalopathy: Secondary | ICD-10-CM | POA: Diagnosis present

## 2019-08-24 DIAGNOSIS — J9602 Acute respiratory failure with hypercapnia: Secondary | ICD-10-CM | POA: Diagnosis not present

## 2019-08-24 DIAGNOSIS — T424X5A Adverse effect of benzodiazepines, initial encounter: Secondary | ICD-10-CM | POA: Diagnosis not present

## 2019-08-24 DIAGNOSIS — Z7189 Other specified counseling: Secondary | ICD-10-CM

## 2019-08-24 DIAGNOSIS — K589 Irritable bowel syndrome without diarrhea: Secondary | ICD-10-CM | POA: Diagnosis present

## 2019-08-24 DIAGNOSIS — F419 Anxiety disorder, unspecified: Secondary | ICD-10-CM | POA: Diagnosis present

## 2019-08-24 DIAGNOSIS — E875 Hyperkalemia: Secondary | ICD-10-CM | POA: Diagnosis present

## 2019-08-24 DIAGNOSIS — I2510 Atherosclerotic heart disease of native coronary artery without angina pectoris: Secondary | ICD-10-CM | POA: Diagnosis present

## 2019-08-24 DIAGNOSIS — Z20822 Contact with and (suspected) exposure to covid-19: Secondary | ICD-10-CM | POA: Diagnosis present

## 2019-08-24 DIAGNOSIS — K219 Gastro-esophageal reflux disease without esophagitis: Secondary | ICD-10-CM | POA: Diagnosis present

## 2019-08-24 DIAGNOSIS — Z8507 Personal history of malignant neoplasm of pancreas: Secondary | ICD-10-CM | POA: Diagnosis not present

## 2019-08-24 DIAGNOSIS — R739 Hyperglycemia, unspecified: Secondary | ICD-10-CM | POA: Diagnosis present

## 2019-08-24 DIAGNOSIS — E871 Hypo-osmolality and hyponatremia: Secondary | ICD-10-CM | POA: Diagnosis present

## 2019-08-24 DIAGNOSIS — E785 Hyperlipidemia, unspecified: Secondary | ICD-10-CM | POA: Diagnosis present

## 2019-08-24 DIAGNOSIS — J9622 Acute and chronic respiratory failure with hypercapnia: Secondary | ICD-10-CM | POA: Diagnosis present

## 2019-08-24 DIAGNOSIS — I5033 Acute on chronic diastolic (congestive) heart failure: Secondary | ICD-10-CM | POA: Diagnosis present

## 2019-08-24 DIAGNOSIS — J9621 Acute and chronic respiratory failure with hypoxia: Secondary | ICD-10-CM

## 2019-08-24 DIAGNOSIS — G8929 Other chronic pain: Secondary | ICD-10-CM | POA: Diagnosis present

## 2019-08-24 DIAGNOSIS — I130 Hypertensive heart and chronic kidney disease with heart failure and stage 1 through stage 4 chronic kidney disease, or unspecified chronic kidney disease: Secondary | ICD-10-CM | POA: Diagnosis present

## 2019-08-24 DIAGNOSIS — I4891 Unspecified atrial fibrillation: Secondary | ICD-10-CM | POA: Diagnosis present

## 2019-08-24 DIAGNOSIS — Z7952 Long term (current) use of systemic steroids: Secondary | ICD-10-CM

## 2019-08-24 DIAGNOSIS — I10 Essential (primary) hypertension: Secondary | ICD-10-CM | POA: Diagnosis present

## 2019-08-24 DIAGNOSIS — J441 Chronic obstructive pulmonary disease with (acute) exacerbation: Secondary | ICD-10-CM | POA: Diagnosis present

## 2019-08-24 DIAGNOSIS — C251 Malignant neoplasm of body of pancreas: Secondary | ICD-10-CM | POA: Diagnosis present

## 2019-08-24 DIAGNOSIS — N179 Acute kidney failure, unspecified: Secondary | ICD-10-CM | POA: Diagnosis present

## 2019-08-24 DIAGNOSIS — E7800 Pure hypercholesterolemia, unspecified: Secondary | ICD-10-CM | POA: Diagnosis present

## 2019-08-24 DIAGNOSIS — R0602 Shortness of breath: Secondary | ICD-10-CM | POA: Diagnosis not present

## 2019-08-24 DIAGNOSIS — F329 Major depressive disorder, single episode, unspecified: Secondary | ICD-10-CM | POA: Diagnosis present

## 2019-08-24 DIAGNOSIS — Z79899 Other long term (current) drug therapy: Secondary | ICD-10-CM

## 2019-08-24 DIAGNOSIS — F17210 Nicotine dependence, cigarettes, uncomplicated: Secondary | ICD-10-CM | POA: Diagnosis present

## 2019-08-24 DIAGNOSIS — F0390 Unspecified dementia without behavioral disturbance: Secondary | ICD-10-CM | POA: Diagnosis present

## 2019-08-24 DIAGNOSIS — N1832 Chronic kidney disease, stage 3b: Secondary | ICD-10-CM | POA: Diagnosis present

## 2019-08-24 DIAGNOSIS — Z801 Family history of malignant neoplasm of trachea, bronchus and lung: Secondary | ICD-10-CM

## 2019-08-24 LAB — COMPREHENSIVE METABOLIC PANEL
ALT: 55 U/L — ABNORMAL HIGH (ref 0–44)
AST: 44 U/L — ABNORMAL HIGH (ref 15–41)
Albumin: 4 g/dL (ref 3.5–5.0)
Alkaline Phosphatase: 120 U/L (ref 38–126)
Anion gap: 10 (ref 5–15)
BUN: 42 mg/dL — ABNORMAL HIGH (ref 8–23)
CO2: 24 mmol/L (ref 22–32)
Calcium: 8.6 mg/dL — ABNORMAL LOW (ref 8.9–10.3)
Chloride: 94 mmol/L — ABNORMAL LOW (ref 98–111)
Creatinine, Ser: 2.96 mg/dL — ABNORMAL HIGH (ref 0.44–1.00)
GFR calc Af Amer: 17 mL/min — ABNORMAL LOW (ref >60)
GFR calc non Af Amer: 15 mL/min — ABNORMAL LOW (ref >60)
Glucose, Bld: 114 mg/dL — ABNORMAL HIGH (ref 70–99)
Potassium: 5.3 mmol/L — ABNORMAL HIGH (ref 3.5–5.1)
Sodium: 128 mmol/L — ABNORMAL LOW (ref 135–145)
Total Bilirubin: 0.5 mg/dL (ref 0.3–1.2)
Total Protein: 6.4 g/dL — ABNORMAL LOW (ref 6.5–8.1)

## 2019-08-24 LAB — BLOOD GAS, ARTERIAL
Acid-base deficit: 2.8 mmol/L — ABNORMAL HIGH (ref 0.0–2.0)
Bicarbonate: 26.2 mmol/L (ref 20.0–28.0)
O2 Saturation: 31.7 %
Patient temperature: 98.6
pCO2 arterial: 72.9 mmHg (ref 32.0–48.0)
pH, Arterial: 7.181 — CL (ref 7.350–7.450)
pO2, Arterial: 26.9 mmHg — CL (ref 83.0–108.0)

## 2019-08-24 LAB — CBC WITH DIFFERENTIAL/PLATELET
Abs Immature Granulocytes: 0.13 10*3/uL — ABNORMAL HIGH (ref 0.00–0.07)
Basophils Absolute: 0.1 10*3/uL (ref 0.0–0.1)
Basophils Relative: 1 %
Eosinophils Absolute: 0.1 10*3/uL (ref 0.0–0.5)
Eosinophils Relative: 1 %
HCT: 34 % — ABNORMAL LOW (ref 36.0–46.0)
Hemoglobin: 10.7 g/dL — ABNORMAL LOW (ref 12.0–15.0)
Immature Granulocytes: 2 %
Lymphocytes Relative: 18 %
Lymphs Abs: 1.5 10*3/uL (ref 0.7–4.0)
MCH: 30.1 pg (ref 26.0–34.0)
MCHC: 31.5 g/dL (ref 30.0–36.0)
MCV: 95.8 fL (ref 80.0–100.0)
Monocytes Absolute: 1.3 10*3/uL — ABNORMAL HIGH (ref 0.1–1.0)
Monocytes Relative: 16 %
Neutro Abs: 5.2 10*3/uL (ref 1.7–7.7)
Neutrophils Relative %: 62 %
Platelets: 253 10*3/uL (ref 150–400)
RBC: 3.55 MIL/uL — ABNORMAL LOW (ref 3.87–5.11)
RDW: 18.6 % — ABNORMAL HIGH (ref 11.5–15.5)
WBC: 8.2 10*3/uL (ref 4.0–10.5)
nRBC: 0 % (ref 0.0–0.2)

## 2019-08-24 LAB — SARS CORONAVIRUS 2 BY RT PCR (HOSPITAL ORDER, PERFORMED IN ~~LOC~~ HOSPITAL LAB): SARS Coronavirus 2: NEGATIVE

## 2019-08-24 LAB — TROPONIN I (HIGH SENSITIVITY)
Troponin I (High Sensitivity): 201 ng/L (ref <18)
Troponin I (High Sensitivity): 186 ng/L (ref <18)

## 2019-08-24 LAB — BRAIN NATRIURETIC PEPTIDE: B Natriuretic Peptide: 861.8 pg/mL — ABNORMAL HIGH (ref 0.0–100.0)

## 2019-08-24 LAB — ETHANOL: Alcohol, Ethyl (B): <10 mg/dL (ref <10)

## 2019-08-24 MED ORDER — PROCHLORPERAZINE MALEATE 10 MG PO TABS: 10.0000 mg | ORAL_TABLET | Freq: Four times a day (QID) | ORAL | Status: DC | PRN

## 2019-08-24 MED ORDER — NAPROXEN 250 MG PO TABS
250.0000 mg | ORAL_TABLET | Freq: Every day | ORAL | Status: DC | PRN
  Filled 2019-08-24: qty 1

## 2019-08-24 MED ORDER — METOPROLOL SUCCINATE ER 50 MG PO TB24
50.0000 mg | ORAL_TABLET | Freq: Every evening | ORAL | Status: DC
  Administered 2019-08-25 – 2019-08-28 (×4): 50 mg via ORAL
  Filled 2019-08-24: qty 2
  Filled 2019-08-24: qty 1
  Filled 2019-08-24: qty 2
  Filled 2019-08-24: qty 1

## 2019-08-24 MED ORDER — PANTOPRAZOLE SODIUM 40 MG PO TBEC
40.0000 mg | DELAYED_RELEASE_TABLET | Freq: Every day | ORAL | Status: DC
  Administered 2019-08-25 – 2019-08-29 (×5): 40 mg via ORAL
  Filled 2019-08-24 (×5): qty 1

## 2019-08-24 MED ORDER — ALBUTEROL SULFATE (2.5 MG/3ML) 0.083% IN NEBU: 2.5000 mg | INHALATION_SOLUTION | RESPIRATORY_TRACT | Status: DC | PRN

## 2019-08-24 MED ORDER — ATORVASTATIN CALCIUM 10 MG PO TABS
10.0000 mg | ORAL_TABLET | Freq: Every evening | ORAL | Status: DC
  Administered 2019-08-25 – 2019-08-28 (×4): 10 mg via ORAL
  Filled 2019-08-24 (×4): qty 1

## 2019-08-24 MED ORDER — FLUOXETINE HCL 20 MG PO CAPS
40.0000 mg | ORAL_CAPSULE | Freq: Every evening | ORAL | Status: DC
  Administered 2019-08-25 – 2019-08-28 (×4): 40 mg via ORAL
  Filled 2019-08-24 (×4): qty 2

## 2019-08-24 MED ORDER — ACETAMINOPHEN 325 MG PO TABS
650.0000 mg | ORAL_TABLET | ORAL | Status: DC | PRN
  Administered 2019-08-26 (×3): 650 mg via ORAL
  Filled 2019-08-24 (×4): qty 2

## 2019-08-24 MED ORDER — FUROSEMIDE 10 MG/ML IJ SOLN
40.0000 mg | Freq: Four times a day (QID) | INTRAMUSCULAR | Status: DC
  Administered 2019-08-25 (×3): 40 mg via INTRAVENOUS
  Filled 2019-08-24 (×3): qty 4

## 2019-08-24 MED ORDER — AEROCHAMBER PLUS FLO-VU MEDIUM MISC
1.0000 | Freq: Once | Status: AC
  Administered 2019-08-24: 1
  Filled 2019-08-24: qty 1

## 2019-08-24 MED ORDER — LORAZEPAM 1 MG PO TABS
1.0000 mg | ORAL_TABLET | Freq: Every day | ORAL | Status: DC
  Administered 2019-08-25: 1 mg via ORAL
  Filled 2019-08-24: qty 1

## 2019-08-24 MED ORDER — LOSARTAN POTASSIUM 50 MG PO TABS: 25.0000 mg | ORAL_TABLET | Freq: Every evening | ORAL | Status: DC

## 2019-08-24 MED ORDER — ALPRAZOLAM 0.25 MG PO TABS: 0.2500 mg | ORAL_TABLET | Freq: Two times a day (BID) | ORAL | Status: DC | PRN

## 2019-08-24 MED ORDER — HEPARIN SODIUM (PORCINE) 5000 UNIT/ML IJ SOLN
5000.0000 [IU] | Freq: Three times a day (TID) | INTRAMUSCULAR | Status: DC
  Administered 2019-08-24 – 2019-08-29 (×14): 5000 [IU] via SUBCUTANEOUS
  Filled 2019-08-24 (×13): qty 1

## 2019-08-24 MED ORDER — NITROGLYCERIN 0.4 MG SL SUBL: 0.4000 mg | SUBLINGUAL_TABLET | SUBLINGUAL | Status: DC | PRN

## 2019-08-24 MED ORDER — OXYCODONE HCL 5 MG PO TABS
15.0000 mg | ORAL_TABLET | Freq: Four times a day (QID) | ORAL | Status: DC | PRN
  Administered 2019-08-25: 15 mg via ORAL
  Filled 2019-08-24: qty 3

## 2019-08-24 MED ORDER — SODIUM CHLORIDE 0.9 % IV BOLUS
500.0000 mL | Freq: Once | INTRAVENOUS | Status: AC
  Administered 2019-08-24: 500 mL via INTRAVENOUS

## 2019-08-24 MED ORDER — METHYLPREDNISOLONE SODIUM SUCC 40 MG IJ SOLR
40.0000 mg | Freq: Three times a day (TID) | INTRAMUSCULAR | Status: DC
  Administered 2019-08-25 (×2): 40 mg via INTRAVENOUS
  Filled 2019-08-24 (×2): qty 1

## 2019-08-24 MED ORDER — VITAMIN D 25 MCG (1000 UNIT) PO TABS
2000.0000 [IU] | ORAL_TABLET | Freq: Every day | ORAL | Status: DC
  Administered 2019-08-25 – 2019-08-29 (×5): 2000 [IU] via ORAL
  Filled 2019-08-24 (×5): qty 2

## 2019-08-24 MED ORDER — ASPIRIN EC 81 MG PO TBEC
81.0000 mg | DELAYED_RELEASE_TABLET | Freq: Every day | ORAL | Status: DC
  Administered 2019-08-25 – 2019-08-29 (×5): 81 mg via ORAL
  Filled 2019-08-24 (×5): qty 1

## 2019-08-24 MED ORDER — PREDNISONE 20 MG PO TABS: 10.0000 mg | ORAL_TABLET | Freq: Every day | ORAL | Status: DC

## 2019-08-24 MED ORDER — POTASSIUM CHLORIDE CRYS ER 10 MEQ PO TBCR: 10.0000 meq | EXTENDED_RELEASE_TABLET | Freq: Every evening | ORAL | Status: DC

## 2019-08-24 MED ORDER — ALBUTEROL SULFATE HFA 108 (90 BASE) MCG/ACT IN AERS
8.0000 | INHALATION_SPRAY | Freq: Once | RESPIRATORY_TRACT | Status: AC
  Administered 2019-08-24: 8 via RESPIRATORY_TRACT
  Filled 2019-08-24: qty 6.7

## 2019-08-24 NOTE — ED Triage Notes (Signed)
Patient here for respiratory distress.

## 2019-08-24 NOTE — ED Provider Notes (Addendum)
[facility] DEPT Provider Note   CSN: [id number] Arrival date & time: 08/24/19  1931    History Chief Complaint  Patient presents with   Shortness of Breath   Allison Vaughan is a 74 y.o. female with a history of COPD, CHF- last EF 60-65% with grade II diastolic dysfunction, hypertension, hypercholesterolemia, CAD, afib not currently anticoagulated, & pancreatic cancer who presents to the ED for evaluation of dyspnea x 3 days. Per EMS they received call out tonight from patient's daughter for evaluation of respiratory distress. Per Psychologist, occupational. Patient had SPO2 in the 60s on RA, improved with NRB, initially had poor air movement, received 2 duonebs & steroids en route with some improvement, some wheezing noted following 2nd neb tx. EMS states they have been called to the house over the past couple of days and have given patient neb txs with her subsequently refusing transport. Tonight daughter called as patient seemed much worse, there was also concern she was a bit altered. Patient relays she has been short of breath and coughing, she states she has not had her covid 19 vaccine, she is somewhat confused. Level 5 caveat secondary to AMS.   HPI     Past Medical History:  Diagnosis Date   Accidental drug overdose    Acute encephalopathy    Acute on chronic respiratory failure with hypoxia ([place]) 03/01/2017   Acute renal failure ([place]) 04/16/2014   AKI (acute kidney injury) ([place]) 04/30/2014   Altered mental status 06/27/2012   Anxiety    Atrial fibrillation ([place])    CAD (coronary artery disease)    CAP (community acquired pneumonia) 04/18/2014   Cardiomyopathy ([place])    Chronic hypoxemic respiratory failure ([place]) 06/26/2012   Chronic pain    COPD exacerbation ([place]) 06/26/2012   COPD with acute exacerbation ([place]) 03/01/2017   DEPRESSIVE DISORDER, NOS 03/13/2006   Qualifier: Diagnosis of  By: Damita Dunnings MD, Phillip Heal     Edema, peripheral    Essential  hypertension 04/30/2014   Fall at home, initial encounter 03/01/2017   GASTROESOPHAGEAL REFLUX, NO ESOPHAGITIS 03/13/2006   Qualifier: Diagnosis of  By: Damita Dunnings MD, Burt Ek 03/13/2006   Qualifier: Diagnosis of  By: Damita Dunnings MD, Phillip Heal     Hyperlipidemia    HYPERTENSION, BENIGN SYSTEMIC 03/13/2006   Qualifier: Diagnosis of  By: Damita Dunnings MD, [place]     Hypokalemia 04/30/2014   Hyponatremia 04/30/2014   Hypotension 04/30/2014   Hypoxemia 06/26/2012   Hypoxia 03/26/2018   Irritable bowel syndrome 03/13/2006   Qualifier: Diagnosis of  By: Damita Dunnings MD, [place]     Lung nodule 04/16/2014   MENOPAUSAL SYNDROME 03/13/2006   Qualifier: Diagnosis of  By: Damita Dunnings MD, Phillip Heal     Mood disorder ([place]) 03/13/2006   Qualifier: Diagnosis of  By: Damita Dunnings MD, Phillip Heal     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[place])    Nausea 04/30/2014   Nausea vomiting and diarrhea 04/16/2014   OSTEOARTHRITIS, MULTI SITES 03/13/2006   Qualifier: Diagnosis of  By: Damita Dunnings MD, [place]     Pulmonary nodule 04/30/2014   Respiratory failure ([place]) 01/22/2017   TOBACCO DEPENDENCE 03/13/2006   Qualifier: Diagnosis of  By: Damita Dunnings MD, Phillip Heal     Weight loss     Patient Active Problem List   Diagnosis Date Noted   COPD exacerbation ([place]) 08/13/2019   Acute CHF (congestive heart failure) ([place]) 08/13/2019   Hypertensive urgency 08/13/2019   Allergy reaction, arthritis 08/09/2019   Port-A-Cath in place 07/30/2019  Goals of care, counseling/discussion 07/13/2019   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[place]) 07/12/2019   Pancreatic neoplasm 06/24/2019   Prolonged QT interval 12/22/2018   Hypomagnesemia 12/22/2018   Constipation 12/22/2018   Dilation of pancreatic duct 12/22/2018   Altered mental status 07/09/2018   Acute lower UTI 07/09/2018   Anemia 07/09/2018   Bronchiolitis 07/09/2018   Bronchitis 96/22/2979   Acute metabolic encephalopathy 89/21/1941   Contusion of right wrist     Fall    Bacteria in urine    Mild renal insufficiency 03/26/2018   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[facility])    Atrial fibrillation with RVR ([place]) 03/01/2017   Acute on chronic respiratory failure with hypoxia ([place]) 03/01/2017   Fall at home, initial encounter 03/01/2017   Accidental drug overdose    Acute respiratory failure with hypoxia ([place]) 01/22/2017   Acute encephalopathy    AKI (acute kidney injury) ([place]) 04/30/2014   Pulmonary nodule 04/30/2014   Essential hypertension 04/30/2014   Hypokalemia 04/30/2014   Hyponatremia 04/30/2014   Hypotension 04/30/2014   Nausea & vomiting 04/30/2014   Lung nodule 04/16/2014   Chronic hypoxemic respiratory failure ([place]) 06/26/2012   COPD (chronic obstructive pulmonary disease) ([place]) 06/26/2012   HYPERCHOLESTEROLEMIA 03/13/2006   TOBACCO DEPENDENCE 03/13/2006   Mood disorder ([place]) 03/13/2006   HYPERTENSION, BENIGN SYSTEMIC 03/13/2006   GASTROESOPHAGEAL REFLUX, NO ESOPHAGITIS 03/13/2006   IRRITABLE BOWEL SYNDROME 03/13/2006   MENOPAUSAL SYNDROME 03/13/2006   OSTEOARTHRITIS, MULTI SITES 03/13/2006    Past Surgical History:  Procedure Laterality Date   ABDOMINAL HYSTERECTOMY     ESOPHAGOGASTRODUODENOSCOPY N/A 07/08/2019   Procedure: ESOPHAGOGASTRODUODENOSCOPY (EGD);  Surgeon: Milus Banister, MD;  Location: [facility];  Service: Endoscopy;  Laterality: N/A;   ESOPHAGOGASTRODUODENOSCOPY (EGD) WITH PROPOFOL N/A 05/05/2019   Procedure: ESOPHAGOGASTRODUODENOSCOPY (EGD) WITH PROPOFOL;  Surgeon: Arta Silence, MD;  Location: [facility];  Service: Endoscopy;  Laterality: N/A;   EUS N/A 07/08/2019   Procedure: UPPER ENDOSCOPIC ULTRASOUND (EUS) RADIAL;  Surgeon: Milus Banister, MD;  Location: [facility];  Service: Endoscopy;  Laterality: N/A;   EYE SURGERY     FIDUCIAL MARKER PLACEMENT N/A 07/08/2019   Procedure: FIDUCIAL MARKER PLACEMENT;  Surgeon: Milus Banister, MD;  Location: [facility];  Service:  Endoscopy;  Laterality: N/A;   FINE NEEDLE ASPIRATION N/A 05/05/2019   Procedure: FINE NEEDLE ASPIRATION (FNA) LINEAR;  Surgeon: Arta Silence, MD;  Location: [facility];  Service: Endoscopy;  Laterality: N/A;   FINE NEEDLE ASPIRATION N/A 07/08/2019   Procedure: FINE NEEDLE ASPIRATION (FNA) LINEAR;  Surgeon: Milus Banister, MD;  Location: [facility];  Service: Endoscopy;  Laterality: N/A;   IR IMAGING GUIDED PORT INSERTION  07/27/2019   ROTATOR CUFF REPAIR Right 2012   UPPER ESOPHAGEAL ENDOSCOPIC ULTRASOUND (EUS) N/A 05/05/2019   Procedure: UPPER ESOPHAGEAL ENDOSCOPIC ULTRASOUND (EUS) with FNA;  Surgeon: Arta Silence, MD;  Location: [facility];  Service: Endoscopy;  Laterality: N/A;     OB History   No obstetric history on file.    Family History  Problem Relation Age of Onset   CAD Father    Lung cancer Brother    Cancer Brother        lung ca   Cancer Brother        possible pancreas cancer    Healthy Daughter    Healthy Daughter    Cancer Maternal Grandmother        breast   Cancer Paternal Grandmother        breast  Social History   Tobacco Use   Smoking status: Current Every Day Smoker    Packs/day: 1.50    Years: 50.00    Pack years: 75.00    Types: Cigarettes   Smokeless tobacco: Never Used   Tobacco comment: states she is trying to quit  Vaping Use   Vaping Use: Never used  Substance Use Topics   Alcohol use: Not Currently    Comment: drank alcohol daily x20 years, quit 10 years ago   Drug use: No    Home Medications Prior to Admission medications   Medication Sig Start Date End Date Taking? Authorizing Provider  albuterol (PROVENTIL HFA;VENTOLIN HFA) 108 (90 BASE) MCG/ACT inhaler Inhale 2 puffs into the lungs every 6 (six) hours as needed for wheezing. 06/09/12   Monika Salk, MD  albuterol (PROVENTIL) (2.5 MG/3ML) 0.083% nebulizer solution Take 3 mLs (2.5 mg total) by nebulization every 2 (two) hours as needed for wheezing.  04/20/14   Rama, Venetia Maxon, MD  Aspirin-Salicylamide-Caffeine (BC HEADACHE PO) Take 1 packet by mouth 2 (two) times daily as needed (headaches).     [provider]  atorvastatin (LIPITOR) 10 MG tablet Take 1 tablet (10 mg total) by mouth at bedtime. Patient taking differently: Take 10 mg by mouth every evening.  03/28/18   Cherene Altes, MD  Calcium Carbonate (CALCIUM 500 PO) Take 500 mg by mouth daily.    [provider]  Cholecalciferol (VITAMIN D3) 50 MCG (2000 UT) capsule Take 2,000 Units by mouth daily.    [provider]  FLUoxetine (PROZAC) 40 MG capsule Take 40 mg by mouth every evening.     [provider]  lidocaine-prilocaine (EMLA) cream Apply to affected area as needed weekly 07/22/19   Truitt Merle, MD  LORazepam (ATIVAN) 0.5 MG tablet Take 0.5 mg by mouth at bedtime.     [provider]  losartan (COZAAR) 25 MG tablet Take 1 tablet (25 mg total) by mouth daily. Patient taking differently: Take 25 mg by mouth every evening.  02/01/19 08/12/20  Kroeger, Lorelee Cover., PA-C  metoprolol succinate (TOPROL-XL) 25 MG 24 hr tablet Take 2 tablets (50 mg total) by mouth daily. Take with or immediately following a meal. Patient taking differently: Take 50 mg by mouth every evening. Take with or immediately following a meal. 02/01/19 08/12/20  Kroeger, Lorelee Cover., PA-C  naproxen sodium (ALEVE) 220 MG tablet Take 220 mg by mouth daily as needed (headache).    [provider]  nitroGLYCERIN (NITROSTAT) 0.4 MG SL tablet Place 1 tablet (0.4 mg total) under the tongue every 5 (five) minutes as needed for chest pain. 02/01/19 08/12/20  Kroeger, Lorelee Cover., PA-C  omeprazole (PRILOSEC) 40 MG capsule Take 40 mg by mouth every evening.    [provider]  ondansetron (ZOFRAN) 8 MG tablet Take 1 tablet (8 mg total) by mouth 2 (two) times daily as needed (Nausea or vomiting). 07/13/19   Truitt Merle, MD  oxyCODONE 10 MG TABS Take 1 tablet (10 mg total) by mouth  every 6 (six) hours as needed for severe pain. 07/11/19   Ward, Delice Bison, DO  potassium chloride (KLOR-CON) 10 MEQ tablet Take 10 mEq by mouth every evening.  04/15/19   [provider]  predniSONE (DELTASONE) 10 MG tablet Take 4 tablets (40 mg) daily for 2 days, then, Take 3 tablets (30 mg) daily for 2 days, then, Take 2 tablets (20 mg) daily for 2 days, then, Take 1 tablets (10  mg) daily like you were taking before. 08/15/19   Terrilee Croak, MD  predniSONE (DELTASONE) 10 MG tablet Take 1 tablet (10 mg total) by mouth daily with breakfast. 08/24/19   Dahal, Marlowe Aschoff, MD  prochlorperazine (COMPAZINE) 10 MG tablet Take 1 tablet (10 mg total) by mouth every 6 (six) hours as needed (Nausea or vomiting). 07/13/19   Truitt Merle, MD    Allergies    Patient has no known allergies.  Review of Systems   Review of Systems  Unable to perform ROS: Mental status change  Respiratory: Positive for cough and shortness of breath.    Physical Exam Updated Vital Signs BP (!) 165/111    Pulse (!) 58    Temp 99.2 F (37.3 C) (Rectal)    Resp 12    Ht 5' (1.524 m)    Wt 52.6 kg    SpO2 99%    BMI 22.65 kg/m   Physical Exam Vitals and nursing note reviewed.  Constitutional:      General: She is not in acute distress.    Appearance: She is not toxic-appearing.  HENT:     Head: Normocephalic and atraumatic.  Neck:     Comments: No nuchal rigidity. Cardiovascular:     Rate and Rhythm: Normal rate and regular rhythm.     Comments: 2+ symmetric radial pulses. Pulmonary:     Comments: SPO2 90% on room air.  Increased to 65% with application of 2 L via nasal cannula.  She has poor air movement throughout.  Possible expiratory wheeze noted at the bases. Abdominal:     Palpations: Abdomen is soft.     Tenderness: There is no abdominal tenderness. There is no guarding or rebound.  Musculoskeletal:     Cervical back: Neck supple.     Comments: No significant peripheral edema.  Skin:    General: Skin is warm  and dry.  Neurological:     Mental Status: She is alert.     Comments: Patient is alert, she is slow to answer some questions, but does answer yes and no questions, does seem confused. Protecting her airway.   Moving all extremities.    ED Results / Procedures / Treatments   Labs (all labs ordered are listed, but only abnormal results are displayed) Labs Reviewed  BLOOD GAS, ARTERIAL - Abnormal; Notable for the following components:      Result Value   pH, Arterial 7.181 (*)    pCO2 arterial 72.9 (*)    pO2, Arterial 26.9 (*)    Acid-base deficit 2.8 (*)    All other components within normal limits  SARS CORONAVIRUS 2 BY RT PCR (HOSPITAL ORDER, [facility])  COMPREHENSIVE METABOLIC PANEL  CBC WITH DIFFERENTIAL/PLATELET  BRAIN NATRIURETIC PEPTIDE  URINALYSIS, ROUTINE W REFLEX MICROSCOPIC  RAPID URINE DRUG SCREEN, HOSP PERFORMED  ETHANOL  TROPONIN I (HIGH SENSITIVITY)    EKG EKG Interpretation  Date/Time:  Tuesday August 24 2019 20:07:31 EDT Ventricular Rate:  59 PR Interval:    QRS Duration: 117 QT Interval:  444 QTC Calculation: 440 R Axis:   110 Text Interpretation: Sinus rhythm Short PR interval Nonspecific intraventricular conduction delay Borderline T abnormalities, diffuse leads When compared to prior, similar appearance. no STEMI Confirmed by Antony Blackbird [phone]) on 08/24/2019 8:23:24 PM   Radiology DG Chest Port 1 View  Result Date: 08/24/2019 CLINICAL DATA:  Shortness of breath EXAM: PORTABLE CHEST 1 VIEW COMPARISON:  08/22/2019 FINDINGS: Right Port-A-Cath remains in place, unchanged.  Heart is borderline in size. Aortic atherosclerosis. No confluent opacities or effusions. No edema. No acute bony abnormality. IMPRESSION: No active disease. Electronically Signed   By: Rolm Baptise M.D.   On: 08/24/2019 20:23    Procedures .Critical Care Performed by: Amaryllis Dyke, PA-C Authorized by: Amaryllis Dyke, PA-C     CRITICAL CARE Performed by: Kennith Maes   Total critical care time: 40 minutes  Critical care time was exclusive of separately billable procedures and treating other patients.  Critical care was necessary to treat or prevent imminent or life-threatening deterioration.  Critical care was time spent personally by me on the following activities: development of treatment plan with patient and/or surrogate as well as nursing, discussions with consultants, evaluation of patient's response to treatment, examination of patient, obtaining history from patient or surrogate, ordering and performing treatments and interventions, ordering and review of laboratory studies, ordering and review of radiographic studies, pulse oximetry and re-evaluation of patient's condition.  (including critical care time)  Medications Ordered in ED Medications  albuterol (VENTOLIN HFA) 108 (90 Base) MCG/ACT inhaler 8 puff (has no administration in time range)  AeroChamber Plus Flo-Vu Medium MISC 1 each (has no administration in time range)    ED Course  I have reviewed the triage vital signs and the nursing notes.  Pertinent labs & imaging results that were available during my care of the patient were reviewed by me and considered in my medical decision making (see chart for details).    ADRIAHNA SHEARMAN was evaluated in Emergency Department on 08/24/2019 for the symptoms described in the history of present illness. He/she was evaluated in the context of the global COVID-19 pandemic, which necessitated consideration that the patient might be at risk for infection with the SARS-CoV-2 virus that causes COVID-19. Institutional protocols and algorithms that pertain to the evaluation of patients at risk for COVID-19 are in a state of rapid change based on information released by regulatory bodies including the CDC and federal and state organizations. These policies and algorithms were followed during the  patient's care in the ED.  MDM Rules/Calculators/A&P                         Patient presents to the ED for evaluation of dyspnea x 3 days worsened tonight.  There have been calls to EMS for past few days for dyspnea, nebs given with patient declining transport to ED for assessment.  On arrival her SPO2 is 90% on room air, improved with application of 2 L via nasal cannula.  She has poor air movement with possible expiratory wheeze.  She does seem confused, not consistently following commands or answering questions.  She was recently seen in the ER for an episode of unresponsiveness that improved with Narcan.  DDx: COPD exacerbation, hypercapnia, CHF exacerbation, pneumonia, COVID-19, alcohol/drug intoxication, electrolyte derangement, metabolic derangement, UTI.  Additional history obtained:  Additional history obtained from EMS and chart review.   EKG: Similar compared to prior, no STEMI. Imaging Studies ordered:  I ordered imaging studies which included CXR, I independently visualized and interpreted imaging which showed no acute process.  Lab Tests:  I Ordered, reviewed, and interpreted labs, which included:  ABG was ordered, discussed with respiratory therapist who relays that blood appeared venous when obtaining: Patient with respiratory acidosis with a pH of 7.181 and a CO2 of 72.9, bicarb is within normal limits.  Given her O2 was 26.9, suspect this was a venous  sample.---> Anticipate patient will require BiPAP, holding off until COVID-19 test results, she is mentating well enough with ability to answer questions at this time. CBC: No significant leukocytosis, anemia similar to prior. CMP: Acute kidney injury with creatinine 2.96 and BUN of 42, several electrolyte abnormalities as above. Ethanol: WNL  Troponin: elevated at 201, EKG without significant change, suspect this is secondary to demand. BNP: Elevated @ 861, however no findings of CHF on CXR, improved from most recent BNP on  record UA/UDS: Pending   21:11: COVID negative--> discussed w/ RT Quillian Quince- will place patient on BIPAP.   Plan to admit for:  -Acute hypercapnic respiratory failure, starting BiPAP -COPD exacerbation, received duo nebs and steroids prior to arrival, albuterol puffs ordered prior to BiPAP being available. -Acute kidney injury-500 cc fluid bolus ordered, chest x-ray without edema, avoid aggressive hydration in setting of patient with known CHF.  21:36: CONSULT: Discussed with hospitalist Dr. Crissie Sickles who accepts admission.   This is a shared visit with supervising physician Dr. Sherry Ruffing who has independently evaluated patient & provided guidance in evaluation/management/disposition, in agreement with care   Portions of this note were generated with Dragon dictation software. Dictation errors may occur despite best attempts at proofreading.  Final Clinical Impression(s) / ED Diagnoses Final diagnoses:  Acute respiratory failure with hypercapnia ([place])  COPD exacerbation ([place])  AKI (acute kidney injury) ([place])    Rx / DC Orders ED Discharge Orders    None       Amaryllis Dyke, PA-C 08/24/19 2137  Attempted to call patient's daughter for updated, but no answer at this time.     Leafy Kindle 08/24/19 2139    Tegeler, Gwenyth Allegra, MD 08/24/19 2150

## 2019-08-24 NOTE — ED Triage Notes (Signed)
74 yo female BIBA from home c/o respiratory distress. Pt has hx COPD, CHF, pancreatic CA. Pt was satting 68% on RA. Pt is currently on 2L nasal cannula per EMS. Pt as lethargic and experiencing AMS per EMS. Pt was given 2 duoneb treatments as well a 125mg  of solumedrol.  18 g in left AC. Pt baseline pt is AoX4 but currently pt AOx3 disoriented to time per EMS.  Vitals:  170/80 BP 56 HR 22 RA 100 % on neb tx

## 2019-08-24 NOTE — H&P (Signed)
History and Physical    Allison Vaughan OIZ:[id number] DOB: 1945-05-31 DOA: 08/24/2019  PCP: Sandi Mariscal, MD (Confirm with patient/family/NH records and if not entered, this has to be entered at [facility] point of entry) Patient coming from: home  I have personally briefly reviewed patient's old medical records in [place]  Chief Complaint: progrressive shortness of breath; acute hypoxemia  HPI: Allison Vaughan is a 74 y.o. female with medical history significant of  CHF, COPD, hypertension, hypercholesterolemia, CAD, A. fib, and pancreatic cancer who presents with shortness of breath and hypoxia.  According to EMS, patient has had EMS come out to her house for the last 3 days with shortness of breath.  Typically, she would get a breathing treatment and refused transport.  She reports that today, she was more somnolent and respiratory distress.  Fire department found her oxygen saturation to be in the 60s on arrival and she was given steroids and duo nebs prior to arrival.  She is feeling better after breathing treatments but is still somnolent and confused from baseline per EMS.  She denies any chest pain, fevers, or chills.  She does report some mild cough.  She denies abdominal pain or back pain.  She denies leg pain or leg swelling.   Patient with recent ED evaluation 08/22/19 for somnolence. Had thorough examination and was released to home  ED Course: Tmax 99.2, 136/76, HR 58, RR17. ABG with PCO2 72.9, PO2 26.9. Na 128, Cr 2.96, Troponin #1 201, # 186, BNP 861, CBCw/ Diff nl. CXR NAD. Oxygen provided and BiPAP started for hypercarbia/hypercapnic respiratory failure. TRH called to admit patient for continued management of respiratory failure and acute on chronic HFpEF.  Review of Systems: As per HPI otherwise 10 point review of systems negative. Caveat - difficult communication with patient on BiPAP   Past Medical History:  Diagnosis Date  . Accidental drug overdose   . Acute  encephalopathy   . Acute on chronic respiratory failure with hypoxia ([place]) 03/01/2017  . Acute renal failure ([place]) 04/16/2014  . AKI (acute kidney injury) ([place]) 04/30/2014  . Altered mental status 06/27/2012  . Anxiety   . Atrial fibrillation ([place])   . CAD (coronary artery disease)   . CAP (community acquired pneumonia) 04/18/2014  . Cardiomyopathy ([place])   . Chronic hypoxemic respiratory failure ([place]) 06/26/2012  . Chronic pain   . COPD exacerbation ([place]) 06/26/2012  . COPD with acute exacerbation ([place]) 03/01/2017  . DEPRESSIVE DISORDER, NOS 03/13/2006   Qualifier: Diagnosis of  By: Damita Dunnings MD, Phillip Heal    . Edema, peripheral   . Essential hypertension 04/30/2014  . Fall at home, initial encounter 03/01/2017  . GASTROESOPHAGEAL REFLUX, NO ESOPHAGITIS 03/13/2006   Qualifier: Diagnosis of  By: Damita Dunnings MD, Phillip Heal    . HYPERCHOLESTEROLEMIA 03/13/2006   Qualifier: Diagnosis of  By: Damita Dunnings MD, Phillip Heal    . Hyperlipidemia   . HYPERTENSION, BENIGN SYSTEMIC 03/13/2006   Qualifier: Diagnosis of  By: Damita Dunnings MD, Phillip Heal    . Hypokalemia 04/30/2014  . Hyponatremia 04/30/2014  . Hypotension 04/30/2014  . Hypoxemia 06/26/2012  . Hypoxia 03/26/2018  . Irritable bowel syndrome 03/13/2006   Qualifier: Diagnosis of  By: Damita Dunnings MD, Phillip Heal    . Lung nodule 04/16/2014  . MENOPAUSAL SYNDROME 03/13/2006   Qualifier: Diagnosis of  By: Damita Dunnings MD, Phillip Heal    . Mood disorder [facility]) 03/13/2006   Qualifier: Diagnosis of  By: Damita Dunnings MD, Phillip Heal    . 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  Nausea 04/30/2014  . Nausea vomiting and diarrhea 04/16/2014  . OSTEOARTHRITIS, MULTI SITES 03/13/2006   Qualifier: Diagnosis of  By: Damita Dunnings MD, Phillip Heal    . Pulmonary nodule 04/30/2014  . Respiratory failure ([place]) 01/22/2017  . TOBACCO DEPENDENCE 03/13/2006   Qualifier: Diagnosis of  By: Damita Dunnings MD, Phillip Heal    . Weight loss     Past Surgical History:  Procedure Laterality Date  . ABDOMINAL HYSTERECTOMY    . ESOPHAGOGASTRODUODENOSCOPY N/A 07/08/2019    Procedure: ESOPHAGOGASTRODUODENOSCOPY (EGD);  Surgeon: Milus Banister, MD;  Location: [facility];  Service: Endoscopy;  Laterality: N/A;  . ESOPHAGOGASTRODUODENOSCOPY (EGD) WITH PROPOFOL N/A 05/05/2019   Procedure: ESOPHAGOGASTRODUODENOSCOPY (EGD) WITH PROPOFOL;  Surgeon: Arta Silence, MD;  Location: [facility];  Service: Endoscopy;  Laterality: N/A;  . EUS N/A 07/08/2019   Procedure: UPPER ENDOSCOPIC ULTRASOUND (EUS) RADIAL;  Surgeon: Milus Banister, MD;  Location: [facility] ENDOSCOPY;  Service: Endoscopy;  Laterality: N/A;  . EYE SURGERY    . FIDUCIAL MARKER PLACEMENT N/A 07/08/2019   Procedure: FIDUCIAL MARKER PLACEMENT;  Surgeon: Milus Banister, MD;  Location: [facility];  Service: Endoscopy;  Laterality: N/A;  . FINE NEEDLE ASPIRATION N/A 05/05/2019   Procedure: FINE NEEDLE ASPIRATION (FNA) LINEAR;  Surgeon: Arta Silence, MD;  Location: [facility];  Service: Endoscopy;  Laterality: N/A;  . FINE NEEDLE ASPIRATION N/A 07/08/2019   Procedure: FINE NEEDLE ASPIRATION (FNA) LINEAR;  Surgeon: Milus Banister, MD;  Location: [facility];  Service: Endoscopy;  Laterality: N/A;  . IR IMAGING GUIDED PORT INSERTION  07/27/2019  . ROTATOR CUFF REPAIR Right 2012  . UPPER ESOPHAGEAL ENDOSCOPIC ULTRASOUND (EUS) N/A 05/05/2019   Procedure: UPPER ESOPHAGEAL ENDOSCOPIC ULTRASOUND (EUS) with FNA;  Surgeon: Arta Silence, MD;  Location: [facility];  Service: Endoscopy;  Laterality: N/A;   Soc Hx -  Widowed. Has two daughters, 1 son deceased. She lives with her daughter - supportive family.     reports that she has been smoking cigarettes. She has a 75.00 pack-year smoking history. She has never used smokeless tobacco. She reports previous alcohol use. She reports that she does not use drugs.  No Known Allergies  Family History  Problem Relation Age of Onset  . CAD Father   . Lung cancer Brother   . Cancer Brother        lung ca  . Cancer Brother        possible pancreas cancer   . Healthy  Daughter   . Healthy Daughter   . Cancer Maternal Grandmother        breast  . Cancer Paternal Grandmother        breast     Prior to Admission medications   Medication Sig Start Date End Date Taking? Authorizing Provider  albuterol (PROVENTIL HFA;VENTOLIN HFA) 108 (90 BASE) MCG/ACT inhaler Inhale 2 puffs into the lungs every 6 (six) hours as needed for wheezing. 06/09/12  Yes Oti, Brantley Stage, MD  albuterol (PROVENTIL) (2.5 MG/3ML) 0.083% nebulizer solution Take 3 mLs (2.5 mg total) by nebulization every 2 (two) hours as needed for wheezing. 04/20/14  Yes Rama, Venetia Maxon, MD  Aspirin-Salicylamide-Caffeine (BC HEADACHE PO) Take 1 packet by mouth 2 (two) times daily as needed (headaches).    Yes [provider]  atorvastatin (LIPITOR) 10 MG tablet Take 1 tablet (10 mg total) by mouth at bedtime. Patient taking differently: Take 10 mg by mouth every evening.  03/28/18  Yes Cherene Altes, MD  Calcium Carbonate (CALCIUM 500 PO)  Take 500 mg by mouth daily.   Yes [provider]  Cholecalciferol (VITAMIN D3) 50 MCG (2000 UT) capsule Take 2,000 Units by mouth daily.   Yes [provider]  FLUoxetine (PROZAC) 40 MG capsule Take 40 mg by mouth every evening.    Yes [provider]  lidocaine-prilocaine (EMLA) cream Apply to affected area as needed weekly 07/22/19  Yes Truitt Merle, MD  LORazepam (ATIVAN) 1 MG tablet Take 1 mg by mouth daily. 08/17/19  Yes [provider]  losartan (COZAAR) 25 MG tablet Take 1 tablet (25 mg total) by mouth daily. Patient taking differently: Take 25 mg by mouth every evening.  02/01/19 08/12/20 Yes Kroeger, Lorelee Cover., PA-C  metoprolol succinate (TOPROL-XL) 25 MG 24 hr tablet Take 2 tablets (50 mg total) by mouth daily. Take with or immediately following a meal. Patient taking differently: Take 50 mg by mouth every evening. Take with or immediately following a meal. 02/01/19 08/12/20 Yes Kroeger, Lorelee Cover., PA-C  naproxen sodium (ALEVE)  220 MG tablet Take 220 mg by mouth daily as needed (headache).   Yes [provider]  nitroGLYCERIN (NITROSTAT) 0.4 MG SL tablet Place 1 tablet (0.4 mg total) under the tongue every 5 (five) minutes as needed for chest pain. 02/01/19 08/12/20 Yes Kroeger, Lorelee Cover., PA-C  omeprazole (PRILOSEC) 40 MG capsule Take 40 mg by mouth every evening.   Yes [provider]  ondansetron (ZOFRAN) 8 MG tablet Take 1 tablet (8 mg total) by mouth 2 (two) times daily as needed (Nausea or vomiting). 07/13/19  Yes Truitt Merle, MD  oxyCODONE (ROXICODONE) 15 MG immediate release tablet Take 15 mg by mouth 4 (four) times daily as needed for pain.  08/17/19  Yes [provider]  potassium chloride (KLOR-CON) 10 MEQ tablet Take 10 mEq by mouth every evening.  04/15/19  Yes [provider]  prochlorperazine (COMPAZINE) 10 MG tablet Take 1 tablet (10 mg total) by mouth every 6 (six) hours as needed (Nausea or vomiting). 07/13/19  Yes Truitt Merle, MD  predniSONE (DELTASONE) 10 MG tablet Take 4 tablets (40 mg) daily for 2 days, then, Take 3 tablets (30 mg) daily for 2 days, then, Take 2 tablets (20 mg) daily for 2 days, then, Take 1 tablets (10 mg) daily like you were taking before. Patient not taking: Reported on 08/24/2019 08/15/19   Terrilee Croak, MD  predniSONE (DELTASONE) 10 MG tablet Take 1 tablet (10 mg total) by mouth daily with breakfast. 08/24/19   Terrilee Croak, MD    Physical Exam: Vitals:   08/24/19 2032 08/24/19 2132 08/24/19 2155 08/24/19 2251  BP:  127/76  (!) 175/85  Pulse:  (!) 58  (!) 59  Resp:  17  (!) 21  Temp:      TempSrc:      SpO2:  100% 99% 100%  Weight: 52.6 kg     Height: 5' (1.524 m)       Constitutional: NAD, calm, comfortable Vitals:   08/24/19 2032 08/24/19 2132 08/24/19 2155 08/24/19 2251  BP:  127/76  (!) 175/85  Pulse:  (!) 58  (!) 59  Resp:  17  (!) 21  Temp:      TempSrc:      SpO2:  100% 99% 100%  Weight: 52.6 kg     Height: 5' (1.524 m)       General:  - elderly woman on BiPAP and uncomfortable. Admits to on-going pain. Eyes: PERRL, lids and conjunctivae normal ENMT:  exam thwarted due to BiPAP Neck: normal, supple, no masses, no thyromegaly Respiratory: Decreased BS all fields. No rales - but BiPAP noise hinders exam  Cardiovascular: Regular bradycardia, no murmurs / rubs / gallops. No extremity edema. 1+ pedal pulses. No carotid bruits.  Abdomen:  Tenderness epigastic region, no masses palpated. Bowel sounds positive.  Musculoskeletal: no clubbing / cyanosis. No joint deformity upper and lower extremities.  Skin: no rashes, lesions, ulcers. No induration Neurologic: CN 2-12 grossly intact.   Psychiatric: Communication difficult on BiPAP. Seems to have Normal judgment and insight. Awake, answers questions. Depressed mood.     Labs on Admission: I have personally reviewed following labs and imaging studies  CBC: Recent Labs  Lab 08/22/19 1427 08/24/19 2003  WBC 10.0 8.2  NEUTROABS 6.5 5.2  HGB 10.0* 10.7*  HCT 33.6* 34.0*  MCV 96.6 95.8  PLT 222 062   Basic Metabolic Panel: Recent Labs  Lab 08/22/19 1427 08/24/19 2003  NA 136 128*  K 4.3 5.3*  CL 100 94*  CO2 25 24  GLUCOSE 161* 114*  BUN 16 42*  CREATININE 1.32* 2.96*  CALCIUM 8.5* 8.6*   GFR: Estimated Creatinine Clearance: 11.6 mL/min (A) (by C-G formula based on SCr of 2.96 mg/dL (H)). Liver Function Tests: Recent Labs  Lab 08/22/19 1427 08/24/19 2003  AST 31 44*  ALT 12 55*  ALKPHOS 97 120  BILITOT 0.4 0.5  PROT 5.4* 6.4*  ALBUMIN 3.2* 4.0   No results for input(s): LIPASE, AMYLASE in the last 168 hours. No results for input(s): AMMONIA in the last 168 hours. Coagulation Profile: No results for input(s): INR, PROTIME in the last 168 hours. Cardiac Enzymes: No results for input(s): CKTOTAL, CKMB, CKMBINDEX, TROPONINI in the last 168 hours. BNP (last 3 results) No results for input(s): PROBNP in the last 8760 hours. HbA1C: No results  for input(s): HGBA1C in the last 72 hours. CBG: No results for input(s): GLUCAP in the last 168 hours. Lipid Profile: No results for input(s): CHOL, HDL, LDLCALC, TRIG, CHOLHDL, LDLDIRECT in the last 72 hours. Thyroid Function Tests: No results for input(s): TSH, T4TOTAL, FREET4, T3FREE, THYROIDAB in the last 72 hours. Anemia Panel: No results for input(s): VITAMINB12, FOLATE, FERRITIN, TIBC, IRON, RETICCTPCT in the last 72 hours. Urine analysis:    Component Value Date/Time   COLORURINE STRAW (A) 08/13/2019 1247   APPEARANCEUR CLEAR 08/13/2019 1247   LABSPEC 1.008 08/13/2019 1247   PHURINE 6.0 08/13/2019 1247   GLUCOSEU NEGATIVE 08/13/2019 1247   HGBUR NEGATIVE 08/13/2019 1247   BILIRUBINUR NEGATIVE 08/13/2019 [place] 08/13/2019 1247   PROTEINUR NEGATIVE 08/13/2019 1247   UROBILINOGEN 0.2 04/30/2014 2050   NITRITE NEGATIVE 08/13/2019 1247   LEUKOCYTESUR NEGATIVE 08/13/2019 1247    Radiological Exams on Admission: DG Chest Port 1 View  Result Date: 08/24/2019 CLINICAL DATA:  Shortness of breath EXAM: PORTABLE CHEST 1 VIEW COMPARISON:  08/22/2019 FINDINGS: Right Port-A-Cath remains in place, unchanged. Heart is borderline in size. Aortic atherosclerosis. No confluent opacities or effusions. No edema. No acute bony abnormality. IMPRESSION: No active disease. Electronically Signed   By: Rolm Baptise M.D.   On: 08/24/2019 20:23    EKG: Independently reviewed. Sinus bradycardia at 59 with IVCD - no acute STEMI  Assessment/Plan Active Problems:   Acute on chronic respiratory failure with hypoxia (HCC)   Acute on chronic diastolic CHF (congestive heart failure) (HCC)   AKI (acute kidney injury) (HCC)   Hyponatremia   HYPERTENSION, BENIGN SYSTEMIC  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[place])   Acute on chronic diastolic (congestive) heart failure (HCC)  (please populate well all problems here in Problem List. (For example, if patient is on BP meds at home  and you resume or decide to hold them, it is a problem that needs to be her. Same for CAD, COPD, HLD and so on)   1/ Pul - Baseline COPD now with hypercarbic hypercapnic respiratory failure. Plan Admit to Stepdown  Continue BiPAP - reassess in AM  Albuterol nebs as scheduled  Solumedrol 40 mg q8   2. Acute on Chronic Diastolic HF - las echo 5/70/17 with grade II diastolic dysfxn now with BNP 861 and respiratory failure. Also with elevated troponin due to demand ischemia 2/2 #1 and HF Plan Continue home meds  IV Lasix q6 x 4 doses  CHF protocols  3. AKI - suspect related to hydration issues with previous Cr 1.32 8/8 21. Also mild hyponatremia Plan Gentle hydration NS at 75 cc/hr x 8 hrs, then 50 cc/hr  F/u Bmet  4. Oncology - inoperable pancreatic cancer currently receiving chemotherapy per Dr. Burr Medico. Patient admits to chronic pain, not completely controlled with PO oxycodone Plan Fentanyl patch 36mcg apply q 72 hours  Continue oxycodone for break through pain  Palliative Care consult for goals of care, chroinc pain management and code status   - currently full code  In Summary patient with COPD now with respiratory failure, HFpEF now decompensated and with pancreatic cancer. Will address acute needs and long term care plan.  DVT prophylaxis: heparin  Code Status: full code  Family Communication: Spoke with Clerance Lav, daughter reviewing dx and tx plan, answered questions, referred to [email or web address] Disposition Plan: TBD  Consults called: none  (inpatient / obs / tele / medical floo Admission status: SDU  Adella Hare, MD Pager [phone]  If 7PM-7AM, please contact night-coverage [email or web address] Password [facility]  08/24/2019, 11:21 PM

## 2019-08-25 ENCOUNTER — Inpatient Hospital Stay (HOSPITAL_COMMUNITY): Payer: Medicare HMO

## 2019-08-25 LAB — BLOOD GAS, ARTERIAL
Acid-base deficit: 3 mmol/L — ABNORMAL HIGH (ref 0.0–2.0)
Bicarbonate: 22.9 mmol/L (ref 20.0–28.0)
FIO2: 28
O2 Saturation: 98.4 %
Patient temperature: 98.6
pCO2 arterial: 47.7 mmHg (ref 32.0–48.0)
pH, Arterial: 7.303 — ABNORMAL LOW (ref 7.350–7.450)
pO2, Arterial: 126 mmHg — ABNORMAL HIGH (ref 83.0–108.0)

## 2019-08-25 LAB — RAPID URINE DRUG SCREEN, HOSP PERFORMED
Amphetamines: NOT DETECTED
Barbiturates: NOT DETECTED
Benzodiazepines: POSITIVE — AB
Cocaine: NOT DETECTED
Opiates: POSITIVE — AB
Tetrahydrocannabinol: NOT DETECTED

## 2019-08-25 LAB — HEMOGLOBIN A1C
Hgb A1c MFr Bld: 5.6 % (ref 4.8–5.6)
Mean Plasma Glucose: 114.02 mg/dL

## 2019-08-25 LAB — CBC
HCT: 35.7 % — ABNORMAL LOW (ref 36.0–46.0)
Hemoglobin: 11.5 g/dL — ABNORMAL LOW (ref 12.0–15.0)
MCH: 30.1 pg (ref 26.0–34.0)
MCHC: 32.2 g/dL (ref 30.0–36.0)
MCV: 93.5 fL (ref 80.0–100.0)
Platelets: 221 10*3/uL (ref 150–400)
RBC: 3.82 MIL/uL — ABNORMAL LOW (ref 3.87–5.11)
RDW: 18.4 % — ABNORMAL HIGH (ref 11.5–15.5)
WBC: 4.8 10*3/uL (ref 4.0–10.5)
nRBC: 0 % (ref 0.0–0.2)

## 2019-08-25 LAB — URINALYSIS, ROUTINE W REFLEX MICROSCOPIC
Bilirubin Urine: NEGATIVE
Glucose, UA: NEGATIVE mg/dL
Hgb urine dipstick: NEGATIVE
Ketones, ur: NEGATIVE mg/dL
Leukocytes,Ua: NEGATIVE
Nitrite: NEGATIVE
Protein, ur: NEGATIVE mg/dL
Specific Gravity, Urine: 1.006 (ref 1.005–1.030)
pH: 5 (ref 5.0–8.0)

## 2019-08-25 LAB — COMPREHENSIVE METABOLIC PANEL
ALT: 52 U/L — ABNORMAL HIGH (ref 0–44)
AST: 34 U/L (ref 15–41)
Albumin: 4 g/dL (ref 3.5–5.0)
Alkaline Phosphatase: 123 U/L (ref 38–126)
Anion gap: 15 (ref 5–15)
BUN: 47 mg/dL — ABNORMAL HIGH (ref 8–23)
CO2: 25 mmol/L (ref 22–32)
Calcium: 8.9 mg/dL (ref 8.9–10.3)
Chloride: 93 mmol/L — ABNORMAL LOW (ref 98–111)
Creatinine, Ser: 2.8 mg/dL — ABNORMAL HIGH (ref 0.44–1.00)
GFR calc Af Amer: 18 mL/min — ABNORMAL LOW (ref >60)
GFR calc non Af Amer: 16 mL/min — ABNORMAL LOW (ref >60)
Glucose, Bld: 143 mg/dL — ABNORMAL HIGH (ref 70–99)
Potassium: 5.5 mmol/L — ABNORMAL HIGH (ref 3.5–5.1)
Sodium: 133 mmol/L — ABNORMAL LOW (ref 135–145)
Total Bilirubin: 1.1 mg/dL (ref 0.3–1.2)
Total Protein: 6.6 g/dL (ref 6.5–8.1)

## 2019-08-25 LAB — GLUCOSE, CAPILLARY
Glucose-Capillary: 169 mg/dL — ABNORMAL HIGH (ref 70–99)
Glucose-Capillary: 131 mg/dL — ABNORMAL HIGH (ref 70–99)

## 2019-08-25 LAB — MRSA PCR SCREENING: MRSA by PCR: POSITIVE — AB

## 2019-08-25 MED ORDER — SODIUM ZIRCONIUM CYCLOSILICATE 10 G PO PACK
10.0000 g | PACK | Freq: Once | ORAL | Status: AC
  Administered 2019-08-25: 10 g via ORAL
  Filled 2019-08-25: qty 1

## 2019-08-25 MED ORDER — INSULIN ASPART 100 UNIT/ML ~~LOC~~ SOLN
0.0000 [IU] | Freq: Three times a day (TID) | SUBCUTANEOUS | Status: DC
  Administered 2019-08-25: 2 [IU] via SUBCUTANEOUS

## 2019-08-25 MED ORDER — SODIUM CHLORIDE 0.9 % IV SOLN: INTRAVENOUS | Status: AC

## 2019-08-25 MED ORDER — FENTANYL 25 MCG/HR TD PT72
1.0000 | MEDICATED_PATCH | TRANSDERMAL | Status: DC
  Administered 2019-08-25: 1 via TRANSDERMAL
  Filled 2019-08-25: qty 1

## 2019-08-25 MED ORDER — HYDRALAZINE HCL 20 MG/ML IJ SOLN
10.0000 mg | Freq: Once | INTRAMUSCULAR | Status: AC | PRN
  Administered 2019-08-25: 10 mg via INTRAVENOUS
  Filled 2019-08-25: qty 1

## 2019-08-25 MED ORDER — SODIUM CHLORIDE 0.9% FLUSH
10.0000 mL | Freq: Two times a day (BID) | INTRAVENOUS | Status: DC
  Administered 2019-08-25 – 2019-08-29 (×5): 10 mL

## 2019-08-25 MED ORDER — MUPIROCIN 2 % EX OINT
1.0000 "application " | TOPICAL_OINTMENT | Freq: Two times a day (BID) | CUTANEOUS | Status: DC
  Administered 2019-08-25 – 2019-08-29 (×8): 1 via NASAL
  Filled 2019-08-25 (×3): qty 22

## 2019-08-25 MED ORDER — HYDRALAZINE HCL 25 MG PO TABS
25.0000 mg | ORAL_TABLET | Freq: Three times a day (TID) | ORAL | Status: DC
  Administered 2019-08-25 – 2019-08-26 (×3): 25 mg via ORAL
  Filled 2019-08-25 (×3): qty 1

## 2019-08-25 MED ORDER — SODIUM CHLORIDE 0.9% FLUSH
10.0000 mL | INTRAVENOUS | Status: DC | PRN
  Administered 2019-08-29: 10 mL

## 2019-08-25 MED ORDER — CHLORHEXIDINE GLUCONATE CLOTH 2 % EX PADS
6.0000 | MEDICATED_PAD | Freq: Every day | CUTANEOUS | Status: DC
  Administered 2019-08-26 – 2019-08-29 (×3): 6 via TOPICAL

## 2019-08-25 MED ORDER — PREDNISONE 20 MG PO TABS
40.0000 mg | ORAL_TABLET | Freq: Every day | ORAL | Status: DC
  Administered 2019-08-26: 40 mg via ORAL
  Filled 2019-08-25: qty 2

## 2019-08-25 MED ORDER — IPRATROPIUM-ALBUTEROL 0.5-2.5 (3) MG/3ML IN SOLN
3.0000 mL | Freq: Four times a day (QID) | RESPIRATORY_TRACT | Status: DC
  Administered 2019-08-25 – 2019-08-26 (×6): 3 mL via RESPIRATORY_TRACT
  Filled 2019-08-25 (×5): qty 3

## 2019-08-25 MED ORDER — CHLORHEXIDINE GLUCONATE CLOTH 2 % EX PADS
6.0000 | MEDICATED_PAD | Freq: Every day | CUTANEOUS | Status: DC
  Administered 2019-08-25: 6 via TOPICAL

## 2019-08-25 NOTE — Consult Note (Signed)
Consultation Note Date: 08/25/2019   Patient Name: Allison Vaughan  DOB: 03-Mar-1945  MRN: [id number]  Age / Sex: 74 y.o., female  PCP: Allison Mariscal, MD Referring Physician: Elodia Vaughan., *  Reason for Consultation: Establishing goals of care  HPI/Patient Profile: 74 y.o. female  with past medical history of HFpEF, COPD, HTN, CAD, atrial fibrillation and inoperable pancreatic adenocarcinoma admitted on 08/24/2019 with acute hypercapnic respiratory failure presumable 2/2 decompensated heart failure or possible accidental opioid overdose- respiratory status, and acute kidney injury.   Chart review indicates that EMS has been called to her house for the past three days due to shortness of breath however, after they would give her a breathing treatment, she would refuse transfer to the hospital. On day of admission, she was noted to be somnolent and found to have an O2 sats in the 60s.  Upon arrival to the ED, she was found to have a respiratory acidosis (pH 7.18, CO2 73, O2 of 27), BNP 861 (down from 1100 on 7/30), hyponatremia (128), hyperkalemia (5.3), acute on chronic renal failure (Cr 3), CXR with some mild vascular congestion. Remainder of labs were essentially unremarkable. She is requiring intermittent bipap which causes her distress.  Neoplasm history 12/21/18: initial presented for 6-12 mo hx of 40lb weight loss, abdominal pain and back pain. abdominal CT revealed dilation of distal pancreatic duct 12/24/18: MRCP>3.5cm infiltrating pancreatic body neoplasm causing obstruction of the main pancreatic duct 05/05/19: ERCP performed by Dr. Paulita Vaughan with consistent findings 05/29/19: CT >>interval increase in size involving the body and tail of pancrease with additional vascular involvement of the portal vein, splenic vein and celiac artery 07/08/19: repeat ERCP with bx and fiducial placement>>consistent findings  from 5/15. Pathology results + for adenocarcinoma. Stage III 7/9 Gemcitabine and abraxane started 7/13: Port-a-Cath placed 7/16: neutropenic; memory issues noted 7/20: pt no showed at infusion center and they were unable to reach her 7/23: GCSF given 7/24: developed diffuse skin reaction. Given prednisone 7/26: notes indicate that they will likely do chemo for 3-50mobefore referring for SBRT  Other recent history 7/30-8/1 admission for acute respiratory failure 2/2 COPD exacerbation for decompensated heart failure. D/c on room air 8/8>ED visit for opioid overdose--responded to narcan   Clinical Assessment and Goals of Care:  I have reviewed medical records including EPIC notes, labs and imaging, examined the patient and met at bedside with patient and her daughter, Allison Vaughan Patient's encephalopathy was precluding further discussion with her at this time, so we met with Allison Vaughan in a conference room to continue discussions.   I introduced Palliative Medicine as specialized medical care for people living with serious illness. It focuses on providing relief from the symptoms and stress of a serious illness.   Her daughter notes a fairly significant decline in her cognitive function over the past year. She primarily relays this to memory issues. She does however, feel that [name] doing fairly well with the last couple cycles of therapy. She has been residing with her sister recently but  it sounds like she has been able to complete her ADLs fairly independently. Her daughter does relay that Allison Vaughan's sister has started managing her medications due to the patient taking too many or forgetting to take her medications.   In regards to nutrition, Allison Vaughan, notes that initially, Allison Vaughan had lost around 40# prior to diagnosis. Since then, however, she has gained much of that back and has been eating well.   We discussed her current illness and what it means in the larger context of her on-going  co-morbidities.  Natural disease trajectory and expectations at EOL were discussed. Joseph Vaughan shares that she did not have a good understanding of [name]'s numerous medical issues prior to this admission. She notes that the admission provider called her last evening and, after an extensive discussion with him, feels much more comfortable with her understandings.  We discussed the pancreatic cancer and natural trajectory of this. She understands that this is not a curable disease and that the goal of the chemotherapy is to prolong her life. We discussed how there often comes a time where the harms of the therapies start outweighing the benefit and that, following that, goals may change to a more comfort focus.   I attempted to elicit values and goals of care important to the patient. Joseph Vaughan shares that she and her mom had not discussed EOL wishes in the past. We discussed how, unless patient's encephalopathy clears, these will be decisions that she and her sister would make.   The difference between aggressive medical intervention and comfort care was considered in light of the patient's goals of care.   Advanced directives, concepts specific to code status, artifical feeding and hydration, and rehospitalization were considered and discussed.   Hospice and Palliative Care services outpatient were explained and offered.  Questions and concerns were addressed.  The family was encouraged to call with questions or concerns.   Primary Decision Maker NEXT OF KIN    SUMMARY OF RECOMMENDATIONS    Full scope treatment at this time  Joseph Vaughan will discuss code status and other above issues with her sister this evening and we will continue discussions in AM.  Recommend palliative referral at discharge.  In regards to her pain management and her recent hospitalizations, my overall impression is that her respiratory failure is attributable to a synergistic effect between COPD and respiratory depression>CO2  retention 2/2 to the narcotics. Her pain meds were recently increased by her PCP and I question if this may simply be a higher dose than she can handle with her chronically elevated CO2 levels 2/2 her COPD. Although not readily apparent on her admission CXR, pulm edema may be playing an additional role with CO2 retention.  I would not recommend increasing her narcotics at this time due to the above respiratory issues. We will continue to work towards alternative non-narcotic therapies. We will not adjust her pain medications- will defer to her outpatient pain medication provider.    Code Status/Advance Care Planning:  Full code   Palliative Prophylaxis:   Frequent Pain Assessment, Oral Care and Turn Reposition  Additional Recommendations (Limitations, Scope, Preferences):  Full Scope Treatment   Prognosis:    Unable to determine  Discharge Planning: Home with Palliative Services  Primary Diagnoses: Present on Admission: . HYPERTENSION, BENIGN SYSTEMIC . AKI (acute kidney injury) ([place]) . Hyponatremia . Acute on chronic respiratory failure with hypoxia ([place]) . 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[place]) . Acute on chronic diastolic CHF (congestive heart failure) ([place]) . Acute  on chronic diastolic (congestive) heart failure ([place])   I have reviewed the medical record, interviewed the patient and family, and examined the patient. The following aspects are pertinent.  Past Medical History:  Diagnosis Date  . Accidental drug overdose   . Acute encephalopathy   . Acute on chronic respiratory failure with hypoxia ([place]) 03/01/2017  . Acute renal failure ([place]) 04/16/2014  . AKI (acute kidney injury) ([place]) 04/30/2014  . Altered mental status 06/27/2012  . Anxiety   . Atrial fibrillation ([place])   . CAD (coronary artery disease)   . CAP (community acquired pneumonia) 04/18/2014  . Cardiomyopathy ([place])   . Chronic hypoxemic respiratory failure ([place]) 06/26/2012  . Chronic pain   .  COPD exacerbation ([place]) 06/26/2012  . COPD with acute exacerbation ([place]) 03/01/2017  . DEPRESSIVE DISORDER, NOS 03/13/2006   Qualifier: Diagnosis of  By: Damita Dunnings MD, Phillip Heal    . Edema, peripheral   . Essential hypertension 04/30/2014  . Fall at home, initial encounter 03/01/2017  . GASTROESOPHAGEAL REFLUX, NO ESOPHAGITIS 03/13/2006   Qualifier: Diagnosis of  By: Damita Dunnings MD, Phillip Heal    . HYPERCHOLESTEROLEMIA 03/13/2006   Qualifier: Diagnosis of  By: Damita Dunnings MD, Phillip Heal    . Hyperlipidemia   . HYPERTENSION, BENIGN SYSTEMIC 03/13/2006   Qualifier: Diagnosis of  By: Damita Dunnings MD, Phillip Heal    . Hypokalemia 04/30/2014  . Hyponatremia 04/30/2014  . Hypotension 04/30/2014  . Hypoxemia 06/26/2012  . Hypoxia 03/26/2018  . Irritable bowel syndrome 03/13/2006   Qualifier: Diagnosis of  By: Damita Dunnings MD, Phillip Heal    . Lung nodule 04/16/2014  . MENOPAUSAL SYNDROME 03/13/2006   Qualifier: Diagnosis of  By: Damita Dunnings MD, Phillip Heal    . Mood disorder [facility]) 03/13/2006   Qualifier: Diagnosis of  By: Damita Dunnings MD, Phillip Heal    . 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 Nausea 04/30/2014  . Nausea vomiting and diarrhea 04/16/2014  . OSTEOARTHRITIS, MULTI SITES 03/13/2006   Qualifier: Diagnosis of  By: Damita Dunnings MD, Phillip Heal    . Pulmonary nodule 04/30/2014  . Respiratory failure ([place]) 01/22/2017  . TOBACCO DEPENDENCE 03/13/2006   Qualifier: Diagnosis of  By: Damita Dunnings MD, Phillip Heal    . Weight loss    Social History   Socioeconomic History  . Marital status: Single    Spouse name: Not on file  . Number of children: 3  . Years of education: Not on file  . Highest education level: Not on file  Occupational History  . Not on file  Tobacco Use  . Smoking status: Current Every Day Smoker    Packs/day: 1.50    Years: 50.00    Pack years: 75.00    Types: Cigarettes  . Smokeless tobacco: Never Used  . Tobacco comment: states she is trying to quit  Vaping Use  . Vaping Use: Never used  Substance and Sexual Activity  . Alcohol use: Not Currently     Comment: drank alcohol daily x20 years, quit 10 years ago  . Drug use: No  . Sexual activity: Not on file  Other Topics Concern  . Not on file  Social History Narrative   Not working, lives with one daughter   Has 3 children, 2 daughters and 1 son; son died early 03/04/19 secondary to liver failure from drug-use related Hep C   Social Determinants of Health   Financial Resource Strain:   . Difficulty of Paying Living Expenses:   Food Insecurity:   . Worried About Charity fundraiser in the Last  Year:   . Ran Out of Food in the Last Year:   Transportation Needs:   . Film/video editor (Medical):   Marland Kitchen Lack of Transportation (Non-Medical):   Physical Activity:   . Days of Exercise per Week:   . Minutes of Exercise per Session:   Stress:   . Feeling of Stress :   Social Connections:   . Frequency of Communication with Friends and Family:   . Frequency of Social Gatherings with Friends and Family:   . Attends Religious Services:   . Active Member of Clubs or Organizations:   . Attends Archivist Meetings:   Marland Kitchen Marital Status:    Family History  Problem Relation Age of Onset  . CAD Father   . Lung cancer Brother   . Cancer Brother        lung ca  . Cancer Brother        possible pancreas cancer   . Healthy Daughter   . Healthy Daughter   . Cancer Maternal Grandmother        breast  . Cancer Paternal Grandmother        breast   Scheduled Meds: . aspirin EC  81 mg Oral Daily  . atorvastatin  10 mg Oral QPM  . Chlorhexidine Gluconate Cloth  6 each Topical Daily  . cholecalciferol  2,000 Units Oral Daily  . fentaNYL  1 patch Transdermal Q72H  . FLUoxetine  40 mg Oral QPM  . furosemide  40 mg Intravenous Q6H  . heparin  5,000 Units Subcutaneous Q8H  . hydrALAZINE  25 mg Oral Q8H  . LORazepam  1 mg Oral Daily  . losartan  25 mg Oral QPM  . methylPREDNISolone (SOLU-MEDROL) injection  40 mg Intravenous Q8H  . metoprolol succinate  50 mg Oral QPM  . pantoprazole   40 mg Oral Daily  . potassium chloride  10 mEq Oral QPM   Continuous Infusions: PRN Meds:.acetaminophen, albuterol, ALPRAZolam, naproxen, nitroGLYCERIN, oxyCODONE, prochlorperazine Medications Prior to Admission:  Prior to Admission medications   Medication Sig Start Date End Date Taking? Authorizing Provider  albuterol (PROVENTIL HFA;VENTOLIN HFA) 108 (90 BASE) MCG/ACT inhaler Inhale 2 puffs into the lungs every 6 (six) hours as needed for wheezing. 06/09/12  Yes Oti, Brantley Stage, MD  albuterol (PROVENTIL) (2.5 MG/3ML) 0.083% nebulizer solution Take 3 mLs (2.5 mg total) by nebulization every 2 (two) hours as needed for wheezing. 04/20/14  Yes Rama, Venetia Maxon, MD  Aspirin-Salicylamide-Caffeine (BC HEADACHE PO) Take 1 packet by mouth 2 (two) times daily as needed (headaches).    Yes [provider]  atorvastatin (LIPITOR) 10 MG tablet Take 1 tablet (10 mg total) by mouth at bedtime. Patient taking differently: Take 10 mg by mouth every evening.  03/28/18  Yes Cherene Altes, MD  Calcium Carbonate (CALCIUM 500 PO) Take 500 mg by mouth daily.   Yes [provider]  Cholecalciferol (VITAMIN D3) 50 MCG (2000 UT) capsule Take 2,000 Units by mouth daily.   Yes [provider]  FLUoxetine (PROZAC) 40 MG capsule Take 40 mg by mouth every evening.    Yes [provider]  lidocaine-prilocaine (EMLA) cream Apply to affected area as needed weekly 07/22/19  Yes Truitt Merle, MD  LORazepam (ATIVAN) 1 MG tablet Take 1 mg by mouth daily. 08/17/19  Yes [provider]  losartan (COZAAR) 25 MG tablet Take 1 tablet (25 mg total) by mouth daily. Patient taking differently: Take 25 mg by mouth every  evening.  02/01/19 08/12/20 Yes Kroeger, Lorelee Cover., PA-C  metoprolol succinate (TOPROL-XL) 25 MG 24 hr tablet Take 2 tablets (50 mg total) by mouth daily. Take with or immediately following a meal. Patient taking differently: Take 50 mg by mouth every evening. Take with or immediately  following a meal. 02/01/19 08/12/20 Yes Kroeger, Lorelee Cover., PA-C  naproxen sodium (ALEVE) 220 MG tablet Take 220 mg by mouth daily as needed (headache).   Yes [provider]  nitroGLYCERIN (NITROSTAT) 0.4 MG SL tablet Place 1 tablet (0.4 mg total) under the tongue every 5 (five) minutes as needed for chest pain. 02/01/19 08/12/20 Yes Kroeger, Lorelee Cover., PA-C  omeprazole (PRILOSEC) 40 MG capsule Take 40 mg by mouth every evening.   Yes [provider]  ondansetron (ZOFRAN) 8 MG tablet Take 1 tablet (8 mg total) by mouth 2 (two) times daily as needed (Nausea or vomiting). 07/13/19  Yes Truitt Merle, MD  oxyCODONE (ROXICODONE) 15 MG immediate release tablet Take 15 mg by mouth 4 (four) times daily as needed for pain.  08/17/19  Yes [provider]  potassium chloride (KLOR-CON) 10 MEQ tablet Take 10 mEq by mouth every evening.  04/15/19  Yes [provider]  prochlorperazine (COMPAZINE) 10 MG tablet Take 1 tablet (10 mg total) by mouth every 6 (six) hours as needed (Nausea or vomiting). 07/13/19  Yes Truitt Merle, MD  predniSONE (DELTASONE) 10 MG tablet Take 4 tablets (40 mg) daily for 2 days, then, Take 3 tablets (30 mg) daily for 2 days, then, Take 2 tablets (20 mg) daily for 2 days, then, Take 1 tablets (10 mg) daily like you were taking before. Patient not taking: Reported on 08/24/2019 08/15/19   Terrilee Croak, MD  predniSONE (DELTASONE) 10 MG tablet Take 1 tablet (10 mg total) by mouth daily with breakfast. 08/24/19   Terrilee Croak, MD   No Known Allergies Review of Systems  Unable to perform ROS: Acuity of condition   Vital Signs: BP (!) 211/85   Pulse 62   Temp 98.8 F (37.1 C) (Oral)   Resp 15   Ht 5' (1.524 m)   Wt 53.3 kg   SpO2 98%   BMI 22.95 kg/m  Pain Scale: 0-10   Pain Score: 5    SpO2: SpO2: 98 % O2 Device:SpO2: 98 % O2 Flow Rate: .O2 Flow Rate (L/min): 2 L/min   General: acutely ill appearing female in emotional distress HEENT: BiPAP mask  on Cardiac: extremities warm Pulm: BiPAP 18/5. Breathing more comfortably following mask removal Neuro: alert. Oriented to person, place, and year (but not month) Psych: anxious, tearful affect  IO: Intake/output summary:   Intake/Output Summary (Last 24 hours) at 08/25/2019 1041 Last data filed at 08/25/2019 0848 Gross per 24 hour  Intake 500 ml  Output --  Net 500 ml    LBM:   Baseline Weight: Weight: 52.6 kg Most recent weight: Weight: 53.3 kg     Palliative Assessment/Data: 40%     Thank you for this consult. Palliative medicine will continue to follow and assist as needed.   Mitzi Hansen, MD Internal Medicine Resident PGY-2 [facility] Pager: [phone] 08/25/2019 10:41 AM   Mariana Kaufman, NP Palliative Medicine  Total Time: 70 minutes      Please contact Palliative Medicine Team phone at [phone] for questions and concerns.  For individual provider: See Shea Evans

## 2019-08-25 NOTE — Progress Notes (Addendum)
PROGRESS NOTE    Allison Vaughan  IHK:[id number] DOB: 08-19-1945 DOA: 08/24/2019 PCP: Sandi Mariscal, MD   Chief Complaint  Patient presents with  . Shortness of Breath    Brief Narrative: Allison Vaughan is Allison Vaughan 74 y.o. female with medical history significant of CHF, COPD, hypertension, hypercholesterolemia, CAD, Allison Vaughan. fib, and pancreatic cancer who presents with shortness of breath and hypoxia. According to EMS, patient has had EMS come out to her house for the last 3 days with shortness of breath. Typically, she would get Allison Vaughan breathing treatment and refused transport. She reports that today, she was more somnolent and respiratory distress. Fire department found her oxygen saturation to be in the 60s on arrival and she was given steroids and duo nebs prior to arrival. She is feeling better after breathing treatments but is still somnolent and confused from baseline per EMS. She denies any chest pain, fevers, or chills. She does report some mild cough. She denies abdominal pain or back pain. She denies leg pain or leg swelling.    Assessment & Plan:   Active Problems:   HYPERTENSION, BENIGN SYSTEMIC   AKI (acute kidney injury) (Allison Vaughan)   Hyponatremia   Acute on chronic respiratory failure with hypoxia (HCC)   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HCC)   Acute on chronic diastolic CHF (congestive heart failure) (HCC)   Acute on chronic diastolic (congestive) heart failure (HCC)  Acute Hypoxic and Hypercarbic Respiratory Failure:  ABG shows respiratory acidosis with hypoxemia - improved this AM  Continue bipap nightly and during the day as needed Suspect her somnolence and resp failure are related to narcotics/benzos Hold home benzo (watch for withdrawal) and opiates while somnolent, likely needs reduced dose regimen at discharge Ddx includes COPD and HFpEF exacerbations, though doesn't seem grossly overloaded or with significant wheezing on exam Scheduled and prn nebs, continue  PO steroids  Follow response to lasix  Acute Metabolic Encephalopathy: suspect 2/2 benzos and opiates and respiratory acidosis Continue bipap nightly and prn Hold opiates and benzos (watch for benzo withdrawal)  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  Will follow response to lasix and reassess.  Renal US UA bland Hold losartan and naproxen  Strict I/O, daily weights  Hypertension: home losartan on hold.  Follow response to lasix.   Continue metoprolol S/p prn hydral x1 Also started scheduled hydralazine PO  Elevated Troponin: suspect demand ischemia, troponin downtrending.  Low suspicion for ACS without concerning EKG changes and no typical history.   Hyperkalemia: will give dose of lokelma and follow - has also received lasix, but not much change since lasix  COPD: with hypercarbia, on scheduled and prn nebs with PO steroids - no significant wheezing on exam, will follow, consider deescalating therapy based on course  HFpEF: CXR without active disease.  Weight appears to be less than what it was at previous discharge.  Doesn't appear volume overloaded.   Will follow response to lasix x3 - additional lasix as needed based on future exam Echo with grade II diastolic dysfunction 7/56/4332 Elevated BNP, but lower than previous values and in setting of AKI Strict I/O, daily weights  Wt Readings from Last 3 Encounters:  08/25/19 53.3 kg  08/22/19 54.4 kg  08/15/19 56.3 kg   Unresectable Pancreatic Cancer  Chronic Pain: follows with Dr. Burr Medico, but apparently she does not prescribe pain meds.  On  chemotherapy.   As above, hold opiates and benzos with concern that recent increase in dose is contributing to symptoms above Appreciate palliative care  Hyperglycemia: follow A1c, SSI for now  DVT prophylaxis: heparin  Code Status: full  Family Communication: none  at bedside Disposition:   Status is: Inpatient  Remains inpatient appropriate because:Inpatient level of care appropriate due to severity of illness   Dispo: The patient is from: Home              Anticipated d/c is to: Home              Anticipated d/c date is: > 3 days              Patient currently is not medically stable to d/c.   Consultants:   Palliative  oncology  Procedures:   none  Antimicrobials:  Anti-infectives (From admission, onward)   None     Subjective: No new complaints Allison Vaughan&Ox1-2  Objective: Vitals:   08/25/19 1100 08/25/19 1144 08/25/19 1200 08/25/19 1230  BP: (!) 132/59  (!) 156/67   Pulse: 64 65 61   Resp: 16 15 16    Temp:    97.9 F (36.6 C)  TempSrc:    Axillary  SpO2:      Weight:      Height:        Intake/Output Summary (Last 24 hours) at 08/25/2019 1357 Last data filed at 08/25/2019 1200 Gross per 24 hour  Intake 620 ml  Output 300 ml  Net 320 ml   Filed Weights   08/24/19 2032 08/25/19 0900  Weight: 52.6 kg 53.3 kg    Examination:  General exam: Appears calm and comfortable  Respiratory system: Clear to auscultation. Respiratory effort normal. Cardiovascular system: S1 & S2 heard, RRR.  Gastrointestinal system: Abdomen is nondistended, soft and nontender. Central nervous system: Alert and disoriented. No focal neurological deficits. Extremities: no LEE Skin: No rashes, lesions or ulcers Psychiatry: Judgement and insight appear normal. Mood & affect appropriate.     Data Reviewed: I have personally reviewed following labs and imaging studies  CBC: Recent Labs  Lab 08/22/19 1427 08/24/19 2003 08/25/19 1044  WBC 10.0 8.2 4.8  NEUTROABS 6.5 5.2  --   HGB 10.0* 10.7* 11.5*  HCT 33.6* 34.0* 35.7*  MCV 96.6 95.8 93.5  PLT 222 253 449    Basic Metabolic Panel: Recent Labs  Lab 08/22/19 1427 08/24/19 2003 08/25/19 1044  NA 136 128* 133*  K 4.3 5.3* 5.5*  CL 100 94* 93*  CO2 25 24 25   GLUCOSE 161* 114* 143*   BUN 16 42* 47*  CREATININE 1.32* 2.96* 2.80*  CALCIUM 8.5* 8.6* 8.9    GFR: Estimated Creatinine Clearance: 12.3 mL/min ([place]mers Buendia) (by C-G formula based on SCr of 2.8 mg/dL (H)).  Liver Function Tests: Recent Labs  Lab 08/22/19 1427 08/24/19 2003 08/25/19 1044  AST 31 44* 34  ALT 12 55* 52*  ALKPHOS 97 120 123  BILITOT 0.4 0.5 1.1  PROT 5.4* 6.4* 6.6  ALBUMIN 3.2* 4.0 4.0    CBG: No results for input(s): GLUCAP in the last 168 hours.   Recent Results (from the past 240 hour(s))  SARS Coronavirus 2 by RT PCR (hospital order, performed in [facility] hospital lab) Nasopharyngeal Nasopharyngeal Swab     Status: None   Collection Time: 08/22/19  3:09 PM   Specimen: Nasopharyngeal Swab  Result Value Ref Range Status   SARS Coronavirus 2 NEGATIVE NEGATIVE Final  Comment: (NOTE) SARS-CoV-2 target nucleic acids are NOT DETECTED.  The SARS-CoV-2 RNA is generally detectable in upper and lower respiratory specimens during the acute phase of infection. The lowest concentration of SARS-CoV-2 viral copies this assay can detect is 250 copies / mL. Suleima Ohlendorf negative result does not preclude SARS-CoV-2 infection and should not be used as the sole basis for treatment or other patient management decisions.  Pedram Goodchild negative result may occur with improper specimen collection / handling, submission of specimen other than nasopharyngeal swab, presence of viral mutation(s) within the areas targeted by this assay, and inadequate number of viral copies (<250 copies / mL). Roy Snuffer negative result must be combined with clinical observations, patient history, and epidemiological information.  Fact Sheet for Patients:   StrictlyIdeas.no  Fact Sheet for Healthcare Providers: BankingDealers.co.za  This test is not yet approved or  cleared by the [place] FDA and has been authorized for detection and/or diagnosis of SARS-CoV-2 by FDA under an Emergency Use  Authorization (EUA).  This EUA will remain in effect (meaning this test can be used) for the duration of the COVID-19 declaration under Section 564(b)(1) of the Act, 21 U.S.C. section 360bbb-3(b)(1), unless the authorization is terminated or revoked sooner.  Performed at [facility], [place] [address], [place], [place] [address]   SARS Coronavirus 2 by RT PCR (hospital order, performed in [facility] hospital lab) Nasopharyngeal Nasopharyngeal Swab     Status: None   Collection Time: 08/24/19  8:03 PM   Specimen: Nasopharyngeal Swab  Result Value Ref Range Status   SARS Coronavirus 2 NEGATIVE NEGATIVE Final    Comment: (NOTE) SARS-CoV-2 target nucleic acids are NOT DETECTED.  The SARS-CoV-2 RNA is generally detectable in upper and lower respiratory specimens during the acute phase of infection. The lowest concentration of SARS-CoV-2 viral copies this assay can detect is 250 copies / mL. Deniah Saia negative result does not preclude SARS-CoV-2 infection and should not be used as the sole basis for treatment or other patient management decisions.  Zan Orlick negative result may occur with improper specimen collection / handling, submission of specimen other than nasopharyngeal swab, presence of viral mutation(s) within the areas targeted by this assay, and inadequate number of viral copies (<250 copies / mL). Tanija Germani negative result must be combined with clinical observations, patient history, and epidemiological information.  Fact Sheet for Patients:   StrictlyIdeas.no  Fact Sheet for Healthcare Providers: BankingDealers.co.za  This test is not yet approved or  cleared by the [place] FDA and has been authorized for detection and/or diagnosis of SARS-CoV-2 by FDA under an Emergency Use Authorization (EUA).  This EUA will remain in effect (meaning this test can be used) for the duration of the COVID-19 declaration under Section 564(b)(1) of the Act,  21 U.S.C. section 360bbb-3(b)(1), unless the authorization is terminated or revoked sooner.  Performed at [facility], [place] [address] [place], [place] [address]          Radiology Studies: [facility] Chest Port 1 View  Result Date: 08/24/2019 CLINICAL DATA:  Shortness of breath EXAM: PORTABLE CHEST 1 VIEW COMPARISON:  08/22/2019 FINDINGS: Right Port-Samy Ryner-Cath remains in place, unchanged. Heart is borderline in size. Aortic atherosclerosis. No confluent opacities or effusions. No edema. No acute bony abnormality. IMPRESSION: No active disease. Electronically Signed   By: Rolm Baptise M.D.   On: 08/24/2019 20:23        Scheduled Meds: . aspirin EC  81 mg Oral Daily  . atorvastatin  10  mg Oral QPM  . Chlorhexidine Gluconate Cloth  6 each Topical Daily  . cholecalciferol  2,000 Units Oral Daily  . FLUoxetine  40 mg Oral QPM  . heparin  5,000 Units Subcutaneous Q8H  . hydrALAZINE  25 mg Oral Q8H  . LORazepam  1 mg Oral Daily  . losartan  25 mg Oral QPM  . methylPREDNISolone (SOLU-MEDROL) injection  40 mg Intravenous Q8H  . metoprolol succinate  50 mg Oral QPM  . pantoprazole  40 mg Oral Daily   Continuous Infusions:   LOS: 1 day    Time spent: over 30 min    Fayrene Helper, MD [facility]   To contact the attending provider between 7A-7P or the covering provider during after hours 7P-7A, please log into the web site [email or web address] and access using universal [place] password for that web site. If you do not have the password, please call the hospital operator.  08/25/2019, 1:57 PM

## 2019-08-26 ENCOUNTER — Inpatient Hospital Stay (HOSPITAL_COMMUNITY): Payer: Medicare HMO

## 2019-08-26 DIAGNOSIS — Z515 Encounter for palliative care: Secondary | ICD-10-CM

## 2019-08-26 DIAGNOSIS — J9602 Acute respiratory failure with hypercapnia: Secondary | ICD-10-CM

## 2019-08-26 DIAGNOSIS — Z7189 Other specified counseling: Secondary | ICD-10-CM

## 2019-08-26 DIAGNOSIS — J441 Chronic obstructive pulmonary disease with (acute) exacerbation: Secondary | ICD-10-CM

## 2019-08-26 DIAGNOSIS — I5033 Acute on chronic diastolic (congestive) heart failure: Secondary | ICD-10-CM

## 2019-08-26 LAB — CBC WITH DIFFERENTIAL/PLATELET
Abs Immature Granulocytes: 0.05 10*3/uL (ref 0.00–0.07)
Basophils Absolute: 0 10*3/uL (ref 0.0–0.1)
Basophils Relative: 0 %
Eosinophils Absolute: 0 10*3/uL (ref 0.0–0.5)
Eosinophils Relative: 0 %
HCT: 29.3 % — ABNORMAL LOW (ref 36.0–46.0)
Hemoglobin: 9.7 g/dL — ABNORMAL LOW (ref 12.0–15.0)
Immature Granulocytes: 1 %
Lymphocytes Relative: 5 %
Lymphs Abs: 0.4 10*3/uL — ABNORMAL LOW (ref 0.7–4.0)
MCH: 30.3 pg (ref 26.0–34.0)
MCHC: 33.1 g/dL (ref 30.0–36.0)
MCV: 91.6 fL (ref 80.0–100.0)
Monocytes Absolute: 0.9 10*3/uL (ref 0.1–1.0)
Monocytes Relative: 13 %
Neutro Abs: 5.9 10*3/uL (ref 1.7–7.7)
Neutrophils Relative %: 81 %
Platelets: 187 10*3/uL (ref 150–400)
RBC: 3.2 MIL/uL — ABNORMAL LOW (ref 3.87–5.11)
RDW: 18.7 % — ABNORMAL HIGH (ref 11.5–15.5)
WBC: 7.2 10*3/uL (ref 4.0–10.5)
nRBC: 0 % (ref 0.0–0.2)

## 2019-08-26 LAB — COMPREHENSIVE METABOLIC PANEL
ALT: 42 U/L (ref 0–44)
AST: 24 U/L (ref 15–41)
Albumin: 3.2 g/dL — ABNORMAL LOW (ref 3.5–5.0)
Alkaline Phosphatase: 91 U/L (ref 38–126)
Anion gap: 13 (ref 5–15)
BUN: 54 mg/dL — ABNORMAL HIGH (ref 8–23)
CO2: 26 mmol/L (ref 22–32)
Calcium: 8.3 mg/dL — ABNORMAL LOW (ref 8.9–10.3)
Chloride: 94 mmol/L — ABNORMAL LOW (ref 98–111)
Creatinine, Ser: 2.8 mg/dL — ABNORMAL HIGH (ref 0.44–1.00)
GFR calc Af Amer: 18 mL/min — ABNORMAL LOW (ref >60)
GFR calc non Af Amer: 16 mL/min — ABNORMAL LOW (ref >60)
Glucose, Bld: 114 mg/dL — ABNORMAL HIGH (ref 70–99)
Potassium: 4.7 mmol/L (ref 3.5–5.1)
Sodium: 133 mmol/L — ABNORMAL LOW (ref 135–145)
Total Bilirubin: 0.6 mg/dL (ref 0.3–1.2)
Total Protein: 5.3 g/dL — ABNORMAL LOW (ref 6.5–8.1)

## 2019-08-26 LAB — PHOSPHORUS: Phosphorus: 5.1 mg/dL — ABNORMAL HIGH (ref 2.5–4.6)

## 2019-08-26 LAB — GLUCOSE, CAPILLARY
Glucose-Capillary: 93 mg/dL (ref 70–99)
Glucose-Capillary: 128 mg/dL — ABNORMAL HIGH (ref 70–99)
Glucose-Capillary: 127 mg/dL — ABNORMAL HIGH (ref 70–99)
Glucose-Capillary: 151 mg/dL — ABNORMAL HIGH (ref 70–99)

## 2019-08-26 LAB — MAGNESIUM: Magnesium: 2.2 mg/dL (ref 1.7–2.4)

## 2019-08-26 MED ORDER — IPRATROPIUM-ALBUTEROL 0.5-2.5 (3) MG/3ML IN SOLN
3.0000 mL | Freq: Three times a day (TID) | RESPIRATORY_TRACT | Status: DC
  Administered 2019-08-27 – 2019-08-28 (×3): 3 mL via RESPIRATORY_TRACT
  Filled 2019-08-26 (×3): qty 3

## 2019-08-26 MED ORDER — HYDRALAZINE HCL 50 MG PO TABS
50.0000 mg | ORAL_TABLET | Freq: Three times a day (TID) | ORAL | Status: DC
  Administered 2019-08-26 – 2019-08-29 (×9): 50 mg via ORAL
  Filled 2019-08-26 (×9): qty 1

## 2019-08-26 MED ORDER — BISACODYL 5 MG PO TBEC
5.0000 mg | DELAYED_RELEASE_TABLET | Freq: Every day | ORAL | Status: DC | PRN
  Administered 2019-08-26: 5 mg via ORAL
  Filled 2019-08-26: qty 1

## 2019-08-26 MED ORDER — AMLODIPINE BESYLATE 5 MG PO TABS
5.0000 mg | ORAL_TABLET | Freq: Every day | ORAL | Status: DC
  Administered 2019-08-26: 5 mg via ORAL
  Filled 2019-08-26: qty 1

## 2019-08-26 MED ORDER — ORAL CARE MOUTH RINSE
15.0000 mL | Freq: Two times a day (BID) | OROMUCOSAL | Status: DC
  Administered 2019-08-28: 15 mL via OROMUCOSAL

## 2019-08-26 MED ORDER — LACTATED RINGERS IV SOLN: INTRAVENOUS | Status: AC

## 2019-08-26 MED ORDER — CHLORHEXIDINE GLUCONATE 0.12 % MT SOLN
15.0000 mL | Freq: Two times a day (BID) | OROMUCOSAL | Status: DC
  Administered 2019-08-26 – 2019-08-29 (×6): 15 mL via OROMUCOSAL
  Filled 2019-08-26 (×7): qty 15

## 2019-08-26 MED ORDER — OXYCODONE HCL 5 MG PO TABS: 7.5000 mg | ORAL_TABLET | Freq: Four times a day (QID) | ORAL | Status: DC | PRN

## 2019-08-26 MED ORDER — OXYCODONE HCL 5 MG PO TABS
7.5000 mg | ORAL_TABLET | Freq: Three times a day (TID) | ORAL | Status: DC | PRN
  Administered 2019-08-26: 7.5 mg via ORAL
  Filled 2019-08-26: qty 2

## 2019-08-26 MED ORDER — POLYETHYLENE GLYCOL 3350 17 G PO PACK
17.0000 g | PACK | Freq: Every day | ORAL | Status: DC
  Administered 2019-08-26 – 2019-08-28 (×3): 17 g via ORAL
  Filled 2019-08-26 (×4): qty 1

## 2019-08-26 NOTE — Progress Notes (Addendum)
Daily Progress Note   Patient Name: Allison Vaughan       Date: 08/26/2019 DOB: 1945-09-13  Age: 74 y.o. MRN#: [id number] Attending Physician: Elodia Florence., * Primary Care Physician: Sandi Mariscal, MD Admit Date: 08/24/2019  Reason for Consultation/Follow-up: Establishing goals of care  Subjective: Pt seen at bedside this morning. No complaints this morning. Daughter, [name], updated via phone call as well. We discussed what is likely contributing to these recurrent ED visits/admissions and how there will need to be a delicate balance when working on pain control. We also visited about code status as follow up from our conversation a couple of days ago. She relays that she had reviewed this with her sister, and they agree that a DNR/DNI status would be in [place]'s best interest.   Length of Stay: 2  Current Medications: Scheduled Meds:   aspirin EC  81 mg Oral Daily   atorvastatin  10 mg Oral QPM   chlorhexidine  15 mL Mouth Rinse BID   Chlorhexidine Gluconate Cloth  6 each Topical Q0600   cholecalciferol  2,000 Units Oral Daily   FLUoxetine  40 mg Oral QPM   heparin  5,000 Units Subcutaneous Q8H   hydrALAZINE  25 mg Oral Q8H   insulin aspart  0-9 Units Subcutaneous TID WC   ipratropium-albuterol  3 mL Nebulization Q6H   mouth rinse  15 mL Mouth Rinse q12n4p   metoprolol succinate  50 mg Oral QPM   mupirocin ointment  1 application Nasal BID   pantoprazole  40 mg Oral Daily   predniSONE  40 mg Oral Q breakfast   sodium chloride flush  10-40 mL Intracatheter Q12H    Continuous Infusions:   PRN Meds: acetaminophen, albuterol, nitroGLYCERIN, prochlorperazine, sodium chloride flush       Vital Signs: BP (!) 167/66 Comment: Hydralazine given by nightshift  RN before shift change   Pulse (!) 55    Temp 97.7 F (36.5 C) (Axillary)    Resp 15    Ht 5' (1.524 m)    Wt 53.3 kg    SpO2 93%    BMI 22.95 kg/m  SpO2: SpO2: 93 % O2 Device: O2 Device: Room Air O2 Flow Rate: O2 Flow Rate (L/min): 2 L/min   General: chronically ill appearing female Pulm: breathing comfortably on  room air Cardiac: extremities warm  Intake/output summary:   Intake/Output Summary (Last 24 hours) at 08/26/2019 0828 Last data filed at 08/26/2019 0540 Gross per 24 hour  Intake 1200 ml  Output 1500 ml  Net -300 ml   LBM: Last BM Date:  (UTA) Baseline Weight: Weight: 52.6 kg Most recent weight: Weight: 53.3 kg       Palliative Assessment/Data: 40%      Patient Active Problem List   Diagnosis Date Noted   Acute on chronic diastolic CHF (congestive heart failure) ([place]) 08/24/2019   Acute on chronic diastolic (congestive) heart failure ([place]) 08/24/2019   COPD exacerbation ([place]) 08/13/2019   Acute CHF (congestive heart failure) ([place]) 08/13/2019   Hypertensive urgency 08/13/2019   Allergy reaction, arthritis 08/09/2019   Port-A-Cath in place 07/30/2019   Goals of care, counseling/discussion 07/13/2019   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[place]) 07/12/2019   Pancreatic neoplasm 06/24/2019   Prolonged QT interval 12/22/2018   Hypomagnesemia 12/22/2018   Constipation 12/22/2018   Dilation of pancreatic duct 12/22/2018   Altered mental status 07/09/2018   Acute lower UTI 07/09/2018   Anemia 07/09/2018   Bronchiolitis 07/09/2018   Bronchitis 73/41/9379   Acute metabolic encephalopathy 02/40/9735   Contusion of right wrist    Fall    Bacteria in urine    Mild renal insufficiency 03/26/2018   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HCC)    Atrial fibrillation with RVR ([place]) 03/01/2017   Acute on chronic respiratory failure with hypoxia ([place]) 03/01/2017   Fall at home, initial encounter 03/01/2017   Accidental drug overdose     Acute respiratory failure with hypercapnia ([place]) 01/22/2017   Acute encephalopathy    AKI (acute kidney injury) ([place]) 04/30/2014   Pulmonary nodule 04/30/2014   Essential hypertension 04/30/2014   Hypokalemia 04/30/2014   Hyponatremia 04/30/2014   Hypotension 04/30/2014   Nausea & vomiting 04/30/2014   Lung nodule 04/16/2014   Chronic hypoxemic respiratory failure ([place]) 06/26/2012   COPD (chronic obstructive pulmonary disease) ([place]) 06/26/2012   HYPERCHOLESTEROLEMIA 03/13/2006   TOBACCO DEPENDENCE 03/13/2006   Mood disorder ([place]) 03/13/2006   HYPERTENSION, BENIGN SYSTEMIC 03/13/2006   GASTROESOPHAGEAL REFLUX, NO ESOPHAGITIS 03/13/2006   IRRITABLE BOWEL SYNDROME 03/13/2006   MENOPAUSAL SYNDROME 03/13/2006   OSTEOARTHRITIS, MULTI SITES 03/13/2006    Palliative Care Assessment & Plan   Patient Profile: 74 yof with HFpEF (G2DD), COPD, HTN, and atrial fibrillation, and possible dementia who was found to have inoperable localized pancreatic adenocarcinoma Stage III in December 2020. She follows with Dr. Burr Medico in oncology and is receiving chemotherapy.  She has chronic pain for which she is prescribed opioids and was recently increased about a month ago. Since that time, she has had an acute worsening of her AMS that has slowly developed over the past year. She additionally had an ED visit on 8/8 for respiratory failure 2/2 opoid overdose.  She was admitted again on 8/10 for respiratory failure and acute worsening of her encephalopathy which I suspect is attributable to the narcotics and benzodiazepines in combination with COPD>CO2 retention and decreased clearance in the setting of renal disease.  Assessment/Recommendations/Plan  Code status changed to DNR per [name]'s request.  Pain management: will slowly restart her home meds. I would not recommend going back up to the 15mg  that she was on prior to admission.  Continue oxycodone 5mg  q4h prn Anxiety management:  recommend continuing to hold xanax at this time. If she does need to resume them in the future, I would  start low dose. Palliative referral at discharge  Goals of Care and Additional Recommendations: Limitations on Scope of Treatment: Full Scope Treatment  Code Status: DNR  Prognosis:  Unable to determine  Discharge Planning: To Be Determined  Care plan was discussed with [name] (daughter)  68 MIN  Thank you for allowing the Palliative Medicine Team to assist in the care of this patient.  Mitzi Hansen, MD Internal Medicine Resident PGY-2 [facility] Pager: [phone] 08/26/2019 8:28 AM      Please contact Palliative Medicine Team phone at [phone] for questions and concerns.   Vinie Sill, NP Palliative Medicine Team Pager [phone] (Please see [email or web address] for schedule) Team Phone [phone]    Greater than 50%  of this time was spent counseling and coordinating care related to the above assessment and plan

## 2019-08-26 NOTE — Progress Notes (Signed)
Pt continues to be hypertensive with SBP in 180's. Dr. Florene Glen paged by this RN; verbal orders to continue to monitor since amlodipine was added this AM and hydralazine dosing was adjusted.

## 2019-08-26 NOTE — Progress Notes (Addendum)
Patient and daughter are questioning about pain medications. I explained to both patient and daughter that the plan is to hold benzo's and pain medication for now and gradually reintroduce medications d/t AMS and respiratory distress. Secure chat sent to Dr. Florene Glen and he has agreed to place new orders for Oxycodone at a low dose. Patient and daughter notified.

## 2019-08-26 NOTE — Progress Notes (Signed)
Report called to Orion Modest RN. All questions answered at this time. All belongings and pt chart sent up with pt. Pt transferred in the bed by RN and NT. 5W will continue to care for pt.

## 2019-08-26 NOTE — Progress Notes (Signed)
Pt has been hypertensive despite scheduled hydralazine. BP at 0900 was 187/87; Dr Florene Glen notified by this RN. Verbal orders received to continue to monitor. MD will be adjusting antihypertensive medication this AM. This RN will continue to monitor.

## 2019-08-26 NOTE — Progress Notes (Signed)
Pt not on BIPAP at this time. Pt currently on RA. No distress noted.

## 2019-08-26 NOTE — Progress Notes (Signed)
Daily Progress Note   Patient Name: Allison Vaughan       Date: 08/26/2019 DOB: January 23, 1945  Age: 74 y.o. MRN#: [id number] Attending Physician: Elodia Florence., * Primary Care Physician: Sandi Mariscal, MD Admit Date: 08/24/2019  Reason for Consultation/Follow-up: Establishing goals of care  Subjective: Patient resting on my exam. Noted having some issues with hypertension. No bipap required today. Per nursing patient with oriented nuero status today, and reporting some pain in her back.  All pain and anxiety medications continue to be on hold.  Attempted to call daughter Allison Art' for followup- left message requesting return call.   Length of Stay: 2  Current Medications: Scheduled Meds:  . amLODipine  5 mg Oral Daily  . aspirin EC  81 mg Oral Daily  . atorvastatin  10 mg Oral QPM  . chlorhexidine  15 mL Mouth Rinse BID  . Chlorhexidine Gluconate Cloth  6 each Topical Q0600  . cholecalciferol  2,000 Units Oral Daily  . FLUoxetine  40 mg Oral QPM  . heparin  5,000 Units Subcutaneous Q8H  . hydrALAZINE  50 mg Oral Q8H  . ipratropium-albuterol  3 mL Nebulization Q6H  . mouth rinse  15 mL Mouth Rinse q12n4p  . metoprolol succinate  50 mg Oral QPM  . mupirocin ointment  1 application Nasal BID  . pantoprazole  40 mg Oral Daily  . polyethylene glycol  17 g Oral Daily  . sodium chloride flush  10-40 mL Intracatheter Q12H    Continuous Infusions: . lactated ringers 75 mL/hr at 08/26/19 1600    PRN Meds: acetaminophen, albuterol, nitroGLYCERIN, prochlorperazine, sodium chloride flush    Vital Signs: BP (!) 182/67 (BP Location: Right Arm) Comment: Rechecked by RN  Pulse 66   Temp 97.6 F (36.4 C) (Axillary)   Resp 16   Ht 5' (1.524 m)   Wt 53.3 kg   SpO2 98%   BMI 22.95  kg/m  SpO2: SpO2: 98 % O2 Device: O2 Device: Nasal Cannula O2 Flow Rate: O2 Flow Rate (L/min): 2 L/min (pt resting)  Intake/output summary:   Intake/Output Summary (Last 24 hours) at 08/26/2019 1613 Last data filed at 08/26/2019 1600 Gross per 24 hour  Intake 1340.35 ml  Output 850 ml  Net 490.35 ml   LBM: Last BM Date:  (pt says  she had a BM several days ago) Baseline Weight: Weight: 52.6 kg Most recent weight: Weight: 53.3 kg       Palliative Assessment/Data: PPS: 60%      Patient Active Problem List   Diagnosis Date Noted  . Acute on chronic diastolic CHF (congestive heart failure) ([place]) 08/24/2019  . Acute on chronic diastolic (congestive) heart failure ([place]) 08/24/2019  . COPD exacerbation ([place]) 08/13/2019  . Acute CHF (congestive heart failure) ([place]) 08/13/2019  . Hypertensive urgency 08/13/2019  . Allergy reaction, arthritis 08/09/2019  . Port-A-Cath in place 07/30/2019  . Goals of care, counseling/discussion 07/13/2019  . 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[place]) 07/12/2019  . Pancreatic neoplasm 06/24/2019  . Prolonged QT interval 12/22/2018  . Hypomagnesemia 12/22/2018  . Constipation 12/22/2018  . Dilation of pancreatic duct 12/22/2018  . Altered mental status 07/09/2018  . Acute lower UTI 07/09/2018  . Anemia 07/09/2018  . Bronchiolitis 07/09/2018  . Bronchitis 07/09/2018  . Acute metabolic encephalopathy 68/03/2120  . Contusion of right wrist   . Fall   . Bacteria in urine   . Mild renal insufficiency 03/26/2018  . 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 Atrial fibrillation with RVR ([place]) 03/01/2017  . Acute on chronic respiratory failure with hypoxia ([place]) 03/01/2017  . Fall at home, initial encounter 03/01/2017  . Accidental drug overdose   . Acute respiratory failure with hypercapnia ([place]) 01/22/2017  . Acute encephalopathy   . AKI (acute kidney injury) ([place]) 04/30/2014  . Pulmonary nodule 04/30/2014  . Essential hypertension  04/30/2014  . Hypokalemia 04/30/2014  . Hyponatremia 04/30/2014  . Hypotension 04/30/2014  . Nausea & vomiting 04/30/2014  . Lung nodule 04/16/2014  . Chronic hypoxemic respiratory failure ([place]) 06/26/2012  . COPD (chronic obstructive pulmonary disease) ([place]) 06/26/2012  . HYPERCHOLESTEROLEMIA 03/13/2006  . TOBACCO DEPENDENCE 03/13/2006  . Mood disorder ([place]) 03/13/2006  . HYPERTENSION, BENIGN SYSTEMIC 03/13/2006  . GASTROESOPHAGEAL REFLUX, NO ESOPHAGITIS 03/13/2006  . IRRITABLE BOWEL SYNDROME 03/13/2006  . MENOPAUSAL SYNDROME 03/13/2006  . OSTEOARTHRITIS, MULTI SITES 03/13/2006    Palliative Care Assessment & Plan   Patient Profile: 74 y.o. female  with past medical history of HFpEF, COPD, HTN, CAD, atrial fibrillation and inoperable pancreatic adenocarcinoma admitted on 08/24/2019 with acute hypercapnic respiratory failure presumable 2/2 decompensated heart failure or possible accidental opioid overdose- respiratory status, and acute kidney injury.  Chart review indicates that EMS has been called to her house for the past three days due to shortness of breath however, after they would give her a breathing treatment, she would refuse transfer to the hospital. On day of admission, she was noted to be somnolent and found to have an O2 sats in the 60s. Upon arrival to the ED, she was found to have a respiratory acidosis (pH 7.18, CO2 73, O2 of 27), BNP 861 (down from 1100 on 7/30), hyponatremia (128), hyperkalemia (5.3), acute on chronic renal failure (Cr 3), CXR with some mild vascular congestion. Remainder of labs were essentially unremarkable. She is requiring intermittent bipap which causes her distress. Palliative medicine consulted for goals of care  Assessment/Recommendations/Plan  Continue current interventions When ready- would recommend following pain regimen:  Oxycodone 5mg  q4 hours prn- this would equal 45 MME per day which is half of her current regimen that is 90 MME per day-  once her baseline tolerability of total 24 hr prn is determined- she would likely benefit from a sustained release pain medication with prn for breakthrough pain only- I think it  is also important to discuss with family what "as needed" means- and determine the frequency that she was utilizing her opioid medication, and if it was beneficial and providing adequate pain relief- she may benefit from an opioid rotation or other adjuvant medications as well Noted her lorazepam was prescribed as 1mg  po daily- recommend re-evaluating the reason for the lorazepam- if primarily for sleep or depression- she may benefit more from Trazadone 50mg  QHS rather than a benzodiazapine  PMT will continue to follow- await return call from patient's daughter Allison Art', recommend follow up for patient with outpatient Palliative either with Dr. Hilma Favors at the [facility] or in the home with [facility]  Goals of Care and Additional Recommendations: Limitations on Scope of Treatment: Full Scope Treatment  Code Status: Full code  Prognosis:  Unable to determine- unfortunately, unresectable (cancer that cannot be surgically removed) pancreatic cancer is a life limiting illness with most research indicating medial survival of 10-12 months even with chemotherapy and radiation- this information was provided to patient's daughter per her request  Discharge Planning: Home with Palliative Services  Care plan was discussed with patient's nurse- phone message left for patient's daughter, Allison Art'.   Thank you for allowing the Palliative Medicine Team to assist in the care of this patient.   Time In: 1545 Time Out: 1630 Total Time 45 mins Prolonged Time Billed no      Greater than 50%  of this time was spent counseling and coordinating care related to the above assessment and plan.  Mariana Kaufman, AGNP-C Palliative Medicine   Please contact Palliative Medicine Team phone at [phone] for questions and concerns.

## 2019-08-26 NOTE — Progress Notes (Addendum)
PROGRESS NOTE    Allison Vaughan  RSW:[id number] DOB: Jun 07, 1945 DOA: 08/24/2019 PCP: Sandi Mariscal, MD   Chief Complaint  Patient presents with  . Shortness of Breath    Brief Narrative: Allison Vaughan is Allison Vaughan 74 y.o. female with medical history significant of CHF, COPD, hypertension, hypercholesterolemia, CAD, Allison Vaughan. fib, and pancreatic cancer who presents with shortness of breath and hypoxia. According to EMS, patient has had EMS come out to her house for the last 3 days with shortness of breath. Typically, she would get Allison Vaughan breathing treatment and refused transport. She reports that today, she was more somnolent and respiratory distress. Fire department found her oxygen saturation to be in the 60s on arrival and she was given steroids and duo nebs prior to arrival. She is feeling better after breathing treatments but is still somnolent and confused from baseline per EMS. She denies any chest pain, fevers, or chills. She does report some mild cough. She denies abdominal pain or back pain. She denies leg pain or leg swelling.    Assessment & Plan:   Active Problems:   HYPERTENSION, BENIGN SYSTEMIC   AKI (acute kidney injury) ([place])   Hyponatremia   Acute respiratory failure with hypercapnia (HCC)   Acute on chronic respiratory failure with hypoxia (HCC)   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HCC)   Acute on chronic diastolic CHF (congestive heart failure) (HCC)   Acute on chronic diastolic (congestive) heart failure (HCC)  Acute Hypoxic and Hypercarbic Respiratory Failure:  ABG shows respiratory acidosis with hypoxemia - improved this AM  Continue bipap nightly and during the day as needed Suspect her somnolence and resp failure are related to narcotics/benzos Hold home benzo (watch for withdrawal) and opiates while somnolent, likely needs reduced dose regimen at discharge Ddx includes COPD and HFpEF exacerbations, though doesn't seem grossly overloaded or with significant  wheezing on exam Scheduled and prn nebs Will stop steroids and lasix - follow   Acute Metabolic Encephalopathy: suspect 2/2 benzos and opiates and respiratory acidosis Continue bipap nightly and prn Hold opiates and benzos (watch for benzo withdrawal) - restart gradually as tolerated when consistently improved  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   Renal US - negative for hydro UA bland Hold losartan and naproxen  Strict I/O, daily weights  Hypertension: home losartan on hold.  Follow response to lasix.  Continued elevated BP's Continue hydralazine, start amlodipine - adjust doses as needed   Elevated Troponin: suspect demand ischemia, troponin downtrending.  Low suspicion for ACS without concerning EKG changes and no typical history.   Hyperkalemia: s/p lokelma and lasix Continue to monitor  COPD: with hypercarbia, on scheduled and prn nebs.  No wheezing, will stop steroids.  HFpEF: CXR without active disease.  Weight appears to be less than what it was at previous discharge.  Doesn't appear volume overloaded.   Hold additional lasix, follow on IVF - watch closely Echo with grade II diastolic dysfunction 0/93/8182 Elevated BNP, but lower than previous values and in setting of AKI Strict I/O, daily weights  Wt Readings from Last 3 Encounters:  08/25/19 53.3 kg  08/22/19 54.4 kg  08/15/19 56.3 kg   Unresectable Pancreatic Cancer  Chronic Pain: follows with Dr. Burr Medico, but apparently she does not prescribe pain meds.  On chemotherapy.   As above, hold opiates and benzos with concern that recent increase in dose is contributing  to symptoms above Appreciate palliative care  Hyperglycemia: follow A1c (5.6) - will stop SSI  DVT prophylaxis: heparin  Code Status: full  Family Communication: none at bedside - daughter over phone Disposition:   Status is:  Inpatient  Remains inpatient appropriate because:Inpatient level of care appropriate due to severity of illness   Dispo: The patient is from: Home              Anticipated d/c is to: Home              Anticipated d/c date is: > 3 days              Patient currently is not medically stable to d/c.   Consultants:   Palliative  oncology  Procedures:   none  Antimicrobials:  Anti-infectives (From admission, onward)   None     Subjective: Feeling better Allison Vaughan&Ox3 Pain is ok  Objective: Vitals:   08/26/19 0804 08/26/19 0900 08/26/19 0905 08/26/19 0908  BP:   (!) 187/87   Pulse:  62 66   Resp:  18 20   Temp:    97.9 F (36.6 C)  TempSrc:    Oral  SpO2: 93% 97%    Weight:      Height:        Intake/Output Summary (Last 24 hours) at 08/26/2019 0924 Last data filed at 08/26/2019 0540 Gross per 24 hour  Intake 700 ml  Output 1500 ml  Net -800 ml   Filed Weights   08/24/19 2032 08/25/19 0900  Weight: 52.6 kg 53.3 kg    Examination:  General: No acute distress. Cardiovascular: Heart sounds show Allison Vaughan regular rate, and rhythm. Lungs: Clear to auscultation bilaterally Abdomen: Soft, nontender, nondistended Neurological: Alert and oriented 3. Moves all extremities 4 Cranial nerves II through XII grossly intact. Skin: Warm and dry. No rashes or lesions. Extremities: No clubbing or cyanosis. No edema.   Data Reviewed: I have personally reviewed following labs and imaging studies  CBC: Recent Labs  Lab 08/22/19 1427 08/24/19 2003 08/25/19 1044 08/26/19 0546  WBC 10.0 8.2 4.8 7.2  NEUTROABS 6.5 5.2  --  5.9  HGB 10.0* 10.7* 11.5* 9.7*  HCT 33.6* 34.0* 35.7* 29.3*  MCV 96.6 95.8 93.5 91.6  PLT 222 253 221 932    Basic Metabolic Panel: Recent Labs  Lab 08/22/19 1427 08/24/19 2003 08/25/19 1044 08/26/19 0546  NA 136 128* 133* 133*  K 4.3 5.3* 5.5* 4.7  CL 100 94* 93* 94*  CO2 25 24 25 26   GLUCOSE 161* 114* 143* 114*  BUN 16 42* 47* 54*  CREATININE  1.32* 2.96* 2.80* 2.80*  CALCIUM 8.5* 8.6* 8.9 8.3*  MG  --   --   --  2.2  PHOS  --   --   --  5.1*    GFR: Estimated Creatinine Clearance: 12.3 mL/min (Allison Vaughan) (by C-G formula based on SCr of 2.8 mg/dL (H)).  Liver Function Tests: Recent Labs  Lab 08/22/19 1427 08/24/19 2003 08/25/19 1044 08/26/19 0546  AST 31 44* 34 24  ALT 12 55* 52* 42  ALKPHOS 97 120 123 91  BILITOT 0.4 0.5 1.1 0.6  PROT 5.4* 6.4* 6.6 5.3*  ALBUMIN 3.2* 4.0 4.0 3.2*    CBG: Recent Labs  Lab 08/25/19 1653 08/25/19 2118 08/26/19 0852  GLUCAP 169* 131* 93     Recent Results (from the past 240 hour(s))  SARS Coronavirus 2 by RT PCR (hospital order, performed in [facility] hospital lab)  Nasopharyngeal Nasopharyngeal Swab     Status: None   Collection Time: 08/22/19  3:09 PM   Specimen: Nasopharyngeal Swab  Result Value Ref Range Status   SARS Coronavirus 2 NEGATIVE NEGATIVE Final    Comment: (NOTE) SARS-CoV-2 target nucleic acids are NOT DETECTED.  The SARS-CoV-2 RNA is generally detectable in upper and lower respiratory specimens during the acute phase of infection. The lowest concentration of SARS-CoV-2 viral copies this assay can detect is 250 copies / mL. Yehya Brendle negative result does not preclude SARS-CoV-2 infection and should not be used as the sole basis for treatment or other patient management decisions.  Lavender Stanke negative result may occur with improper specimen collection / handling, submission of specimen other than nasopharyngeal swab, presence of viral mutation(s) within the areas targeted by this assay, and inadequate number of viral copies (<250 copies / mL). Darrious Youman negative result must be combined with clinical observations, patient history, and epidemiological information.  Fact Sheet for Patients:   StrictlyIdeas.no  Fact Sheet for Healthcare Providers: BankingDealers.co.za  This test is not yet approved or  cleared by the [place] FDA  and has been authorized for detection and/or diagnosis of SARS-CoV-2 by FDA under an Emergency Use Authorization (EUA).  This EUA will remain in effect (meaning this test can be used) for the duration of the COVID-19 declaration under Section 564(b)(1) of the Act, 21 U.S.C. section 360bbb-3(b)(1), unless the authorization is terminated or revoked sooner.  Performed at [facility], [place] [address], [place], [place] [address]   SARS Coronavirus 2 by RT PCR (hospital order, performed in [facility] hospital lab) Nasopharyngeal Nasopharyngeal Swab     Status: None   Collection Time: 08/24/19  8:03 PM   Specimen: Nasopharyngeal Swab  Result Value Ref Range Status   SARS Coronavirus 2 NEGATIVE NEGATIVE Final    Comment: (NOTE) SARS-CoV-2 target nucleic acids are NOT DETECTED.  The SARS-CoV-2 RNA is generally detectable in upper and lower respiratory specimens during the acute phase of infection. The lowest concentration of SARS-CoV-2 viral copies this assay can detect is 250 copies / mL. Taleyah Hillman negative result does not preclude SARS-CoV-2 infection and should not be used as the sole basis for treatment or other patient management decisions.  Harvest Stanco negative result may occur with improper specimen collection / handling, submission of specimen other than nasopharyngeal swab, presence of viral mutation(s) within the areas targeted by this assay, and inadequate number of viral copies (<250 copies / mL). Dylin Breeden negative result must be combined with clinical observations, patient history, and epidemiological information.  Fact Sheet for Patients:   StrictlyIdeas.no  Fact Sheet for Healthcare Providers: BankingDealers.co.za  This test is not yet approved or  cleared by the [place] FDA and has been authorized for detection and/or diagnosis of SARS-CoV-2 by FDA under an Emergency Use Authorization (EUA).  This EUA will remain in effect (meaning this  test can be used) for the duration of the COVID-19 declaration under Section 564(b)(1) of the Act, 21 U.S.C. section 360bbb-3(b)(1), unless the authorization is terminated or revoked sooner.  Performed at [facility], [place] [address], [place], [place] [address]   MRSA PCR Screening     Status: Abnormal   Collection Time: 08/25/19 12:02 PM   Specimen: Nasal Mucosa; Nasopharyngeal  Result Value Ref Range Status   MRSA by PCR POSITIVE (Kingslee Mairena) NEGATIVE Final    Comment:        The GeneXpert MRSA Assay (FDA approved for NASAL specimens only),  is one component of Daylon Lafavor comprehensive MRSA colonization surveillance program. It is not intended to diagnose MRSA infection nor to guide or monitor treatment for MRSA infections. RESULT CALLED TO, READ BACK BY AND VERIFIED WITH: Monie Shere HAVNER AT 2057 ON 08/25/2019 BY MOSLEY,J Performed at [facility], [place] [address] [place], [place] [address]          Radiology Studies: US RENAL  Result Date: 08/25/2019 CLINICAL DATA:  74 year old female with acute renal insufficiency. EXAM: RENAL / URINARY TRACT ULTRASOUND COMPLETE COMPARISON:  None. FINDINGS: Right Kidney: Renal measurements: 8.3 x 4.2 x 2.8 cm = volume: 68 mL . Echogenicity within normal limits. No mass or hydronephrosis visualized. Left Kidney: Renal measurements: 7.8 x 3.5 x 3.1 cm = volume: 44 mL. Echogenicity within normal limits. No mass or hydronephrosis visualized. Bladder: Appears normal for degree of bladder distention. The patient had voided before the exam. There is approximately 95 cc postvoid residual. Other: None. IMPRESSION: 1. Unremarkable kidneys. 2. Small postvoid residual. Electronically Signed   By: Anner Crete M.D.   On: 08/25/2019 22:52   DG Chest Port 1 View  Result Date: 08/24/2019 CLINICAL DATA:  Shortness of breath EXAM: PORTABLE CHEST 1 VIEW COMPARISON:  08/22/2019 FINDINGS: Right Port-[place] Shimmel-Cath remains in place, unchanged. Heart is  borderline in size. Aortic atherosclerosis. No confluent opacities or effusions. No edema. No acute bony abnormality. IMPRESSION: No active disease. Electronically Signed   By: Rolm Baptise M.D.   On: 08/24/2019 20:23        Scheduled Meds: . amLODipine  5 mg Oral Daily  . aspirin EC  81 mg Oral Daily  . atorvastatin  10 mg Oral QPM  . chlorhexidine  15 mL Mouth Rinse BID  . Chlorhexidine Gluconate Cloth  6 each Topical Q0600  . cholecalciferol  2,000 Units Oral Daily  . FLUoxetine  40 mg Oral QPM  . heparin  5,000 Units Subcutaneous Q8H  . hydrALAZINE  50 mg Oral Q8H  . insulin aspart  0-9 Units Subcutaneous TID WC  . ipratropium-albuterol  3 mL Nebulization Q6H  . mouth rinse  15 mL Mouth Rinse q12n4p  . metoprolol succinate  50 mg Oral QPM  . mupirocin ointment  1 application Nasal BID  . pantoprazole  40 mg Oral Daily  . predniSONE  40 mg Oral Q breakfast  . sodium chloride flush  10-40 mL Intracatheter Q12H   Continuous Infusions:   LOS: 2 days    Time spent: over 30 min    Fayrene Helper, MD [facility]   To contact the attending provider between 7A-7P or the covering provider during after hours 7P-7A, please log into the web site [email or web address] and access using universal [place] password for that web site. If you do not have the password, please call the hospital operator.  08/26/2019, 9:24 AM

## 2019-08-26 NOTE — TOC Initial Note (Signed)
Transition of Care [facility]) - Initial/Assessment Note    Patient Details  Name: Allison Vaughan MRN: [id number] Date of Birth: [birth year]  Transition of Care [facility]) CM/SW Contact:    Leeroy Cha, RN Phone Number: 08/26/2019, 1:58 PM  Clinical Narrative:                 female from home has pcp with urgent hypertensive crisis.  Following for progression and toc needs. Expected Discharge Plan: Home/Self Care Barriers to Discharge: Continued Medical Work up   Patient Goals and CMS Choice Patient states their goals for this hospitalization and ongoing recovery are:: to return to my home CMS Medicare.gov Compare Post Acute Care list provided to:: Patient    Expected Discharge Plan and Services Expected Discharge Plan: Home/Self Care       Living arrangements for the past 2 months: Single Family Home                                      Prior Living Arrangements/Services Living arrangements for the past 2 months: Single Family Home Lives with:: Self Patient language and need for interpreter reviewed:: Yes Do you feel safe going back to the place where you live?: Yes      Need for Family Participation in Patient Care: Yes (Comment) Care giver support system in place?: Yes (comment)   Criminal Activity/Legal Involvement Pertinent to Current Situation/Hospitalization: No - Comment as needed  Activities of Daily Living   ADL Screening (condition at time of admission) Patient's cognitive ability adequate to safely complete daily activities?: Yes Is the patient deaf or have difficulty hearing?: Yes (hoh per computer notes) Does the patient have difficulty seeing, even when wearing glasses/contacts?: No Does the patient have difficulty walking or climbing stairs?: Yes (gets sob) Weakness of Legs: Both Weakness of Arms/Hands: Both  Permission Sought/Granted                  Emotional Assessment Appearance:: Appears stated age Attitude/Demeanor/Rapport:  Engaged Affect (typically observed): Calm Orientation: : Oriented to Place, Oriented to Self, Oriented to  Time, Oriented to Situation Alcohol / Substance Use: Not Applicable Psych Involvement: No (comment)  Admission diagnosis:  COPD exacerbation ([place]) [J44.1] Acute respiratory failure with hypercapnia ([place]) [J96.02] AKI (acute kidney injury) ([place]) [N17.9] Acute on chronic diastolic (congestive) heart failure ([place]) [I50.33] Patient Active Problem List   Diagnosis Date Noted  . Acute on chronic diastolic CHF (congestive heart failure) ([place]) 08/24/2019  . Acute on chronic diastolic (congestive) heart failure ([place]) 08/24/2019  . COPD exacerbation ([place]) 08/13/2019  . Acute CHF (congestive heart failure) ([place]) 08/13/2019  . Hypertensive urgency 08/13/2019  . Allergy reaction, arthritis 08/09/2019  . Port-A-Cath in place 07/30/2019  . Goals of care, counseling/discussion 07/13/2019  . 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[place]) 07/12/2019  . Pancreatic neoplasm 06/24/2019  . Prolonged QT interval 12/22/2018  . Hypomagnesemia 12/22/2018  . Constipation 12/22/2018  . Dilation of pancreatic duct 12/22/2018  . Altered mental status 07/09/2018  . Acute lower UTI 07/09/2018  . Anemia 07/09/2018  . Bronchiolitis 07/09/2018  . Bronchitis 07/09/2018  . Acute metabolic encephalopathy 09/73/5329  . Contusion of right wrist   . Fall   . Bacteria in urine   . Mild renal insufficiency 03/26/2018  . 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 Atrial fibrillation with RVR ([place]) 03/01/2017  . Acute on chronic respiratory failure with  hypoxia ([place]) 03/01/2017  . Fall at home, initial encounter 03/01/2017  . Accidental drug overdose   . Acute respiratory failure with hypercapnia ([place]) 01/22/2017  . Acute encephalopathy   . AKI (acute kidney injury) ([place]) 04/30/2014  . Pulmonary nodule 04/30/2014  . Essential hypertension 04/30/2014  . Hypokalemia 04/30/2014  . Hyponatremia 04/30/2014  .  Hypotension 04/30/2014  . Nausea & vomiting 04/30/2014  . Lung nodule 04/16/2014  . Chronic hypoxemic respiratory failure ([place]) 06/26/2012  . COPD (chronic obstructive pulmonary disease) ([place]) 06/26/2012  . HYPERCHOLESTEROLEMIA 03/13/2006  . TOBACCO DEPENDENCE 03/13/2006  . Mood disorder ([place]) 03/13/2006  . HYPERTENSION, BENIGN SYSTEMIC 03/13/2006  . GASTROESOPHAGEAL REFLUX, NO ESOPHAGITIS 03/13/2006  . IRRITABLE BOWEL SYNDROME 03/13/2006  . MENOPAUSAL SYNDROME 03/13/2006  . OSTEOARTHRITIS, MULTI SITES 03/13/2006   PCP:  Sandi Mariscal, MD Pharmacy:   [facility], [place] [address] Phone: [phone] Fax: [phone]  [facility] [place], [place] - [place] [address] AT [address] [place] [address] Phone: [phone] Fax: [phone]     Social Determinants of Health (SDOH) Interventions    Readmission Risk Interventions Readmission Risk Prevention Plan 03/28/2018  Transportation Screening Complete  Home Care Screening Complete  Medication Review (RN CM) Complete  Some recent data might be hidden

## 2019-08-26 NOTE — Progress Notes (Signed)
Allison Vaughan   DOB:1945/06/22   VV#:[id number]   GYI#:[id number]  Oncology follow up   Subjective: Pt is well-known to me, under my care for pancreatic cancer.  She is on chemotherapy.  She presented with progressive shortness of breath, hypoxia, and altered mental status. She was admitted 2 days ago, and has recovered very well.  She was on room air when I saw her, was just transferred from  ICU to 5W.  She appears to be alert and oriented, she complains of her chronic back pain, her oxycodone and benzodiazepine has been held due to the recent respite failure and in somnolence.   Objective:  Vitals:   08/26/19 1700 08/26/19 1759  BP: (!) 171/79 (!) 155/82  Pulse: 74 67  Resp: 16 16  Temp:  98.5 F (36.9 C)  SpO2:  96%    Body mass index is 22.95 kg/m.  Intake/Output Summary (Last 24 hours) at 08/26/2019 2022 Last data filed at 08/26/2019 1600 Gross per 24 hour  Intake 1000.35 ml  Output 650 ml  Net 350.35 ml     Sclerae unicteric  Oropharynx clear  No peripheral adenopathy  Lungs clear -- no rales or rhonchi  Heart regular rate and rhythm  Abdomen benign  MSK no focal spinal tenderness, no peripheral edema  Neuro nonfocal    CBG (last 3)  Recent Labs    08/26/19 0852 08/26/19 1235 08/26/19 1720  GLUCAP 93 128* 127*     Labs:  Urine Studies No results for input(s): UHGB, CRYS in the last 72 hours.  Invalid input(s): UACOL, UAPR, USPG, UPH, UTP, UGL, [place], UBIL, UNIT, UROB, [place], UEPI, UWBC, [place], [place], [place], [place], [place]  Basic Metabolic Panel: Recent Labs  Lab 08/22/19 1427 08/22/19 1427 08/24/19 2003 08/24/19 2003 08/25/19 1044 08/26/19 0546  NA 136  --  128*  --  133* 133*  K 4.3   < > 5.3*   < > 5.5* 4.7  CL 100  --  94*  --  93* 94*  CO2 25  --  24  --  25 26  GLUCOSE 161*  --  114*  --  143* 114*  BUN 16  --  42*  --  47* 54*  CREATININE 1.32*  --  2.96*  --  2.80* 2.80*  CALCIUM 8.5*  --  8.6*  --  8.9 8.3*  MG  --   --   --   --   --  2.2   PHOS  --   --   --   --   --  5.1*   < > = values in this interval not displayed.   GFR Estimated Creatinine Clearance: 12.3 mL/min (A) (by C-G formula based on SCr of 2.8 mg/dL (H)). Liver Function Tests: Recent Labs  Lab 08/22/19 1427 08/24/19 2003 08/25/19 1044 08/26/19 0546  AST 31 44* 34 24  ALT 12 55* 52* 42  ALKPHOS 97 120 123 91  BILITOT 0.4 0.5 1.1 0.6  PROT 5.4* 6.4* 6.6 5.3*  ALBUMIN 3.2* 4.0 4.0 3.2*   No results for input(s): LIPASE, AMYLASE in the last 168 hours. No results for input(s): AMMONIA in the last 168 hours. Coagulation profile No results for input(s): INR, PROTIME in the last 168 hours.  CBC: Recent Labs  Lab 08/22/19 1427 08/24/19 2003 08/25/19 1044 08/26/19 0546  WBC 10.0 8.2 4.8 7.2  NEUTROABS 6.5 5.2  --  5.9  HGB 10.0* 10.7* 11.5* 9.7*  HCT 33.6* 34.0* 35.7*  29.3*  MCV 96.6 95.8 93.5 91.6  PLT 222 253 221 187   Cardiac Enzymes: No results for input(s): CKTOTAL, CKMB, CKMBINDEX, TROPONINI in the last 168 hours. BNP: Invalid input(s): POCBNP CBG: Recent Labs  Lab 08/25/19 1653 08/25/19 2118 08/26/19 0852 08/26/19 1235 08/26/19 1720  GLUCAP 169* 131* 93 128* 127*   D-Dimer No results for input(s): DDIMER in the last 72 hours. Hgb A1c Recent Labs    08/25/19 1431  HGBA1C 5.6   Lipid Profile No results for input(s): CHOL, HDL, LDLCALC, TRIG, CHOLHDL, LDLDIRECT in the last 72 hours. Thyroid function studies No results for input(s): TSH, T4TOTAL, T3FREE, THYROIDAB in the last 72 hours.  Invalid input(s): FREET3 Anemia work up No results for input(s): VITAMINB12, FOLATE, FERRITIN, TIBC, IRON, RETICCTPCT in the last 72 hours. Microbiology Recent Results (from the past 240 hour(s))  SARS Coronavirus 2 by RT PCR (hospital order, performed in [facility] hospital lab) Nasopharyngeal Nasopharyngeal Swab     Status: None   Collection Time: 08/22/19  3:09 PM   Specimen: Nasopharyngeal Swab  Result Value Ref Range Status   SARS  Coronavirus 2 NEGATIVE NEGATIVE Final    Comment: (NOTE) SARS-CoV-2 target nucleic acids are NOT DETECTED.  The SARS-CoV-2 RNA is generally detectable in upper and lower respiratory specimens during the acute phase of infection. The lowest concentration of SARS-CoV-2 viral copies this assay can detect is 250 copies / mL. A negative result does not preclude SARS-CoV-2 infection and should not be used as the sole basis for treatment or other patient management decisions.  A negative result may occur with improper specimen collection / handling, submission of specimen other than nasopharyngeal swab, presence of viral mutation(s) within the areas targeted by this assay, and inadequate number of viral copies (<250 copies / mL). A negative result must be combined with clinical observations, patient history, and epidemiological information.  Fact Sheet for Patients:   StrictlyIdeas.no  Fact Sheet for Healthcare Providers: BankingDealers.co.za  This test is not yet approved or  cleared by the [place] FDA and has been authorized for detection and/or diagnosis of SARS-CoV-2 by FDA under an Emergency Use Authorization (EUA).  This EUA will remain in effect (meaning this test can be used) for the duration of the COVID-19 declaration under Section 564(b)(1) of the Act, 21 U.S.C. section 360bbb-3(b)(1), unless the authorization is terminated or revoked sooner.  Performed at [facility], [place] [address] [place],  [address]   SARS Coronavirus 2 by RT PCR (hospital order, performed in [facility] hospital lab) Nasopharyngeal Nasopharyngeal Swab     Status: None   Collection Time: 08/24/19  8:03 PM   Specimen: Nasopharyngeal Swab  Result Value Ref Range Status   SARS Coronavirus 2 NEGATIVE NEGATIVE Final    Comment: (NOTE) SARS-CoV-2 target nucleic acids are NOT DETECTED.  The SARS-CoV-2 RNA is generally detectable in upper and  lower respiratory specimens during the acute phase of infection. The lowest concentration of SARS-CoV-2 viral copies this assay can detect is 250 copies / mL. A negative result does not preclude SARS-CoV-2 infection and should not be used as the sole basis for treatment or other patient management decisions.  A negative result may occur with improper specimen collection / handling, submission of specimen other than nasopharyngeal swab, presence of viral mutation(s) within the areas targeted by this assay, and inadequate number of viral copies (<250 copies / mL). A negative result must be combined with clinical observations, patient history, and epidemiological  information.  Fact Sheet for Patients:   StrictlyIdeas.no  Fact Sheet for Healthcare Providers: BankingDealers.co.za  This test is not yet approved or  cleared by the [place] FDA and has been authorized for detection and/or diagnosis of SARS-CoV-2 by FDA under an Emergency Use Authorization (EUA).  This EUA will remain in effect (meaning this test can be used) for the duration of the COVID-19 declaration under Section 564(b)(1) of the Act, 21 U.S.C. section 360bbb-3(b)(1), unless the authorization is terminated or revoked sooner.  Performed at [facility], [place] [address] [place], [place] [address]   MRSA PCR Screening     Status: Abnormal   Collection Time: 09/02/2019 12:02 PM   Specimen: Nasal Mucosa; Nasopharyngeal  Result Value Ref Range Status   MRSA by PCR POSITIVE (A) NEGATIVE Final    Comment:        The GeneXpert MRSA Assay (FDA approved for NASAL specimens only), is one component of a comprehensive MRSA colonization surveillance program. It is not intended to diagnose MRSA infection nor to guide or monitor treatment for MRSA infections. RESULT CALLED TO, READ BACK BY AND VERIFIED WITH: A HAVNER AT 2057 ON 09/02/2019 BY MOSLEY,J Performed  at [facility], [place] [address] [place], [place] [address]       Studies:  US RENAL  Result Date: Sep 02, 2019 CLINICAL DATA:  74 year old female with acute renal insufficiency. EXAM: RENAL / URINARY TRACT ULTRASOUND COMPLETE COMPARISON:  None. FINDINGS: Right Kidney: Renal measurements: 8.3 x 4.2 x 2.8 cm = volume: 68 mL . Echogenicity within normal limits. No mass or hydronephrosis visualized. Left Kidney: Renal measurements: 7.8 x 3.5 x 3.1 cm = volume: 44 mL. Echogenicity within normal limits. No mass or hydronephrosis visualized. Bladder: Appears normal for degree of bladder distention. The patient had voided before the exam. There is approximately 95 cc postvoid residual. Other: None. IMPRESSION: 1. Unremarkable kidneys. 2. Small postvoid residual. Electronically Signed   By: Anner Crete M.D.   On: 09-02-2019 22:52   DG CHEST PORT 1 VIEW  Result Date: 08/26/2019 CLINICAL DATA:  Shortness of breath EXAM: PORTABLE CHEST 1 VIEW COMPARISON:  Chest radiograph 08/24/2019 FINDINGS: Monitoring leads overlie the patient. Right anterior chest wall Port-A-Cath is present with tip projecting over the right atrium. Stable cardiomegaly. Aortic atherosclerosis. Bibasilar heterogeneous opacities. No pleural effusion or pneumothorax. Thoracic spine degenerative changes. IMPRESSION: Bibasilar heterogeneous opacities may represent atelectasis. Electronically Signed   By: Lovey Newcomer M.D.   On: 08/26/2019 09:30    Assessment: 74 y.o. female   1. Acute respiratory failure, probably related to drug overdose, vs COPD exacerbation, resolved now  2. COPD 3.  Congestive heart failure, stable 4. 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  Unresectable pancreatic cancer, on palliative chemo 6. HTN  7.dementia  8. Chronic back pain on oxycodone   Plan:  -pt was on oxycodone 10mg  tid for back pain which is managed by PCP Dr. Nancy Fetter. Due to her recently diagnosed pancreatic cancer and abdominal pain, her pain  medication was increased to 15mg  tid by Dr. Nancy Fetter 1-2 weeks ago. -I agree with restart oxycodone at lower dose, and titrate as needed.  Appreciate palliative care's input  -Patient has had multiple ED visits and hospitalizations since she started chemotherapy, most are related to her chronic medical conditions, especially COPD and congestive heart failure.  I think her tolerance to chemotherapy is really low, and I would recommend to stop chemo and move on to radiation for consolidation.  Her pancreatic cancer unfortunately is not resectable and not curable.  -I plan to see her back next week in my office after discharge.  -I called her daughter [name] and updated her.    Truitt Merle, MD 08/26/2019

## 2019-08-27 ENCOUNTER — Inpatient Hospital Stay: Payer: Medicare HMO

## 2019-08-27 ENCOUNTER — Inpatient Hospital Stay: Payer: Medicare HMO | Admitting: Hematology

## 2019-08-27 DIAGNOSIS — Z66 Do not resuscitate: Secondary | ICD-10-CM

## 2019-08-27 LAB — CBC WITH DIFFERENTIAL/PLATELET
Abs Immature Granulocytes: 0.04 10*3/uL (ref 0.00–0.07)
Basophils Absolute: 0 10*3/uL (ref 0.0–0.1)
Basophils Relative: 0 %
Eosinophils Absolute: 0 10*3/uL (ref 0.0–0.5)
Eosinophils Relative: 0 %
HCT: 27 % — ABNORMAL LOW (ref 36.0–46.0)
Hemoglobin: 8.6 g/dL — ABNORMAL LOW (ref 12.0–15.0)
Immature Granulocytes: 1 %
Lymphocytes Relative: 10 %
Lymphs Abs: 0.5 10*3/uL — ABNORMAL LOW (ref 0.7–4.0)
MCH: 29.6 pg (ref 26.0–34.0)
MCHC: 31.9 g/dL (ref 30.0–36.0)
MCV: 92.8 fL (ref 80.0–100.0)
Monocytes Absolute: 0.8 10*3/uL (ref 0.1–1.0)
Monocytes Relative: 17 %
Neutro Abs: 3.5 10*3/uL (ref 1.7–7.7)
Neutrophils Relative %: 72 %
Platelets: 149 10*3/uL — ABNORMAL LOW (ref 150–400)
RBC: 2.91 MIL/uL — ABNORMAL LOW (ref 3.87–5.11)
RDW: 19.1 % — ABNORMAL HIGH (ref 11.5–15.5)
WBC: 4.8 10*3/uL (ref 4.0–10.5)
nRBC: 0 % (ref 0.0–0.2)

## 2019-08-27 LAB — GLUCOSE, CAPILLARY
Glucose-Capillary: 88 mg/dL (ref 70–99)
Glucose-Capillary: 79 mg/dL (ref 70–99)
Glucose-Capillary: 123 mg/dL — ABNORMAL HIGH (ref 70–99)
Glucose-Capillary: 101 mg/dL — ABNORMAL HIGH (ref 70–99)

## 2019-08-27 LAB — COMPREHENSIVE METABOLIC PANEL
ALT: 36 U/L (ref 0–44)
AST: 21 U/L (ref 15–41)
Albumin: 3 g/dL — ABNORMAL LOW (ref 3.5–5.0)
Alkaline Phosphatase: 71 U/L (ref 38–126)
Anion gap: 7 (ref 5–15)
BUN: 46 mg/dL — ABNORMAL HIGH (ref 8–23)
CO2: 28 mmol/L (ref 22–32)
Calcium: 8 mg/dL — ABNORMAL LOW (ref 8.9–10.3)
Chloride: 100 mmol/L (ref 98–111)
Creatinine, Ser: 2.03 mg/dL — ABNORMAL HIGH (ref 0.44–1.00)
GFR calc Af Amer: 27 mL/min — ABNORMAL LOW (ref >60)
GFR calc non Af Amer: 23 mL/min — ABNORMAL LOW (ref >60)
Glucose, Bld: 95 mg/dL (ref 70–99)
Potassium: 4.5 mmol/L (ref 3.5–5.1)
Sodium: 135 mmol/L (ref 135–145)
Total Bilirubin: 0.6 mg/dL (ref 0.3–1.2)
Total Protein: 4.9 g/dL — ABNORMAL LOW (ref 6.5–8.1)

## 2019-08-27 LAB — PHOSPHORUS: Phosphorus: 3.3 mg/dL (ref 2.5–4.6)

## 2019-08-27 LAB — MAGNESIUM: Magnesium: 2.1 mg/dL (ref 1.7–2.4)

## 2019-08-27 MED ORDER — AMLODIPINE BESYLATE 10 MG PO TABS
10.0000 mg | ORAL_TABLET | Freq: Every day | ORAL | Status: DC
  Administered 2019-08-27 – 2019-08-29 (×3): 10 mg via ORAL
  Filled 2019-08-27 (×3): qty 1

## 2019-08-27 MED ORDER — OXYCODONE HCL 5 MG PO TABS
5.0000 mg | ORAL_TABLET | ORAL | Status: DC | PRN
  Administered 2019-08-27 – 2019-08-28 (×7): 5 mg via ORAL
  Filled 2019-08-27 (×9): qty 1

## 2019-08-27 NOTE — Care Management Important Message (Signed)
Important Message  Patient Details IM Letter given to the Patient Name: Allison Vaughan MRN: [id number] Date of Birth: [birth year]   Medicare Important Message Given:  Yes     Kerin Salen 08/27/2019, 10:50 AM

## 2019-08-27 NOTE — Progress Notes (Signed)
PROGRESS NOTE    Allison Vaughan  CWC:[id number] DOB: 1945-12-17 DOA: 08/24/2019 PCP: Sandi Mariscal, MD   Chief Complaint  Patient presents with  . Shortness of Breath    Brief Narrative: Allison Vaughan is Allison Vaughan 74 y.o. female with medical history significant of CHF, COPD, hypertension, hypercholesterolemia, CAD, Duha Abair. fib, and pancreatic cancer who presents with shortness of breath and hypoxia. According to EMS, patient has had EMS come out to her house for the last 3 days with shortness of breath. Typically, she would get [place] breathing treatment and refused transport. She reports that today, she was more somnolent and respiratory distress. Fire department found her oxygen saturation to be in the 60s on arrival and she was given steroids and duo nebs prior to arrival. She is feeling better after breathing treatments but is still somnolent and confused from baseline per EMS. She denies any chest pain, fevers, or chills. She does report some mild cough. She denies abdominal pain or back pain. She denies leg pain or leg swelling.    Assessment & Plan:   Active Problems:   HYPERTENSION, BENIGN SYSTEMIC   AKI (acute kidney injury) ([place])   Hyponatremia   Acute respiratory failure with hypercapnia (HCC)   Acute on chronic respiratory failure with hypoxia (HCC)   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HCC)   Acute on chronic diastolic CHF (congestive heart failure) (HCC)   Acute on chronic diastolic (congestive) heart failure (HCC)   Advanced care planning/counseling discussion   Palliative care by specialist  Acute Hypoxic and Hypercarbic Respiratory Failure:  ABG shows respiratory acidosis with hypoxemia - improved this AM  Continue bipap nightly and during the day as needed Suspect her somnolence and resp failure are related to narcotics/benzos Hold home benzo (watch for withdrawal) and opiates while somnolent, likely needs reduced dose regimen at discharge Ddx includes COPD and  HFpEF exacerbations, though doesn't seem grossly overloaded or with significant wheezing on exam Scheduled and prn nebs Will stop steroids and lasix - follow   Acute Metabolic Encephalopathy: suspect 2/2 benzos and opiates and respiratory acidosis Continue bipap nightly and prn Hold opiates and benzos (watch for benzo withdrawal) - restart gradually as tolerated when consistently improved  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  Follow response to lasix.  Continued elevated BP's Continue hydralazine, start amlodipine - adjust doses as needed   Elevated Troponin: suspect demand ischemia, troponin downtrending.  Low suspicion for ACS without concerning EKG changes and no typical history.   Hyperkalemia: s/p lokelma and lasix Continue to monitor  COPD: with hypercarbia, on scheduled and prn nebs.  No wheezing, will stop steroids.  HFpEF: CXR without active disease.  Weight appears to be less than what it was at previous discharge.  Doesn't appear volume overloaded.   Hold additional lasix, follow on IVF - watch closely Echo with grade II diastolic dysfunction 7/61/6073 Elevated BNP, but lower than previous values and in setting of AKI Strict I/O, daily weights  Wt Readings from Last 3 Encounters:  08/27/19 54.4 kg  08/22/19 54.4 kg  08/15/19 56.3 kg   Unresectable Pancreatic Cancer  Chronic Pain: follows with Dr. Burr Medico, but apparently she does not prescribe pain meds.  On chemotherapy.   Resume opiates at lower dose, appreciate palliative care assistance Per Dr. Burr Medico, plan for radiation, likely  stop chemo Appreciate palliative care  Hyperglycemia: follow A1c (5.6) - will stop SSI  DVT prophylaxis: heparin  Code Status: full  Family Communication: none at bedside - daughter over phone Disposition:   Status is:  Inpatient  Remains inpatient appropriate because:Inpatient level of care appropriate due to severity of illness   Dispo: The patient is from: Home              Anticipated d/c is to: Home              Anticipated d/c date is: > 3 days              Patient currently is not medically stable to d/c.   Consultants:   Palliative  oncology  Procedures:   none  Antimicrobials:  Anti-infectives (From admission, onward)   None     Subjective: No new complaints  Objective: Vitals:   08/27/19 0203 08/27/19 0601 08/27/19 0744 08/27/19 1508  BP: (!) 155/68 (!) 185/66  (!) 117/56  Pulse: 61 61  62  Resp: 18   18  Temp: 97.9 F (36.6 C) 98.1 F (36.7 C)  98.1 F (36.7 C)  TempSrc:  Oral  Oral  SpO2: 97% 96% 95% 95%  Weight:  54.4 kg    Height:        Intake/Output Summary (Last 24 hours) at 08/27/2019 1606 Last data filed at 08/27/2019 0900 Gross per 24 hour  Intake 240 ml  Output --  Net 240 ml   Filed Weights   08/24/19 2032 08/25/19 0900 08/27/19 0601  Weight: 52.6 kg 53.3 kg 54.4 kg    Examination:  General: No acute distress. Cardiovascular: Heart sounds show Allison Vaughan regular rate, and rhythm Lungs: Clear to auscultation bilaterally  Abdomen: Soft, nontender, nondistended  Neurological: Alert and oriented 3. Moves all extremities 4. Cranial nerves II through XII grossly intact. Skin: Warm and dry. No rashes or lesions. Extremities: No clubbing or cyanosis. No edema.   Data Reviewed: I have personally reviewed following labs and imaging studies  CBC: Recent Labs  Lab 08/22/19 1427 08/24/19 2003 08/25/19 1044 08/26/19 0546 08/27/19 0420  WBC 10.0 8.2 4.8 7.2 4.8  NEUTROABS 6.5 5.2  --  5.9 3.5  HGB 10.0* 10.7* 11.5* 9.7* 8.6*  HCT 33.6* 34.0* 35.7* 29.3* 27.0*  MCV 96.6 95.8 93.5 91.6 92.8  PLT 222 253 221 187 149*    Basic Metabolic Panel: Recent Labs  Lab 08/22/19 1427 08/24/19 2003 08/25/19 1044 08/26/19 0546 08/27/19 0420  NA 136 128* 133*  133* 135  K 4.3 5.3* 5.5* 4.7 4.5  CL 100 94* 93* 94* 100  CO2 25 24 25 26 28   GLUCOSE 161* 114* 143* 114* 95  BUN 16 42* 47* 54* 46*  CREATININE 1.32* 2.96* 2.80* 2.80* 2.03*  CALCIUM 8.5* 8.6* 8.9 8.3* 8.0*  MG  --   --   --  2.2 2.1  PHOS  --   --   --  5.1* 3.3    GFR: Estimated Creatinine Clearance: 16.9 mL/min (Allison Vaughan) (by C-G formula based on SCr of 2.03 mg/dL (H)).  Liver Function Tests: Recent Labs  Lab 08/22/19 1427 08/24/19 2003 08/25/19 1044 08/26/19 0546 08/27/19 0420  AST 31 44* 34 24 21  ALT 12 55* 52* 42 36  ALKPHOS 97 120 123 91 71  BILITOT 0.4 0.5 1.1 0.6 0.6  PROT 5.4* 6.4* 6.6 5.3* 4.9*  ALBUMIN 3.2* 4.0 4.0 3.2* 3.0*    CBG: Recent Labs  Lab 08/26/19 1235 08/26/19 1720 08/26/19 2208 08/27/19 0738 08/27/19 1151  GLUCAP 128* 127* 151* 88 79     Recent Results (from the past 240 hour(s))  SARS Coronavirus 2 by RT PCR (hospital order, performed in [facility] hospital lab) Nasopharyngeal Nasopharyngeal Swab     Status: None   Collection Time: 08/22/19  3:09 PM   Specimen: Nasopharyngeal Swab  Result Value Ref Range Status   SARS Coronavirus 2 NEGATIVE NEGATIVE Final    Comment: (NOTE) SARS-CoV-2 target nucleic acids are NOT DETECTED.  The SARS-CoV-2 RNA is generally detectable in upper and lower respiratory specimens during the acute phase of infection. The lowest concentration of SARS-CoV-2 viral copies this assay can detect is 250 copies / mL. Allison Vaughan negative result does not preclude SARS-CoV-2 infection and should not be used as the sole basis for treatment or other patient management decisions.  Allison Vaughan negative result may occur with improper specimen collection / handling, submission of specimen other than nasopharyngeal swab, presence of viral mutation(s) within the areas targeted by this assay, and inadequate number of viral copies (<250 copies / mL). Allison Vaughan negative result must be combined with clinical observations, patient history, and  epidemiological information.  Fact Sheet for Patients:   StrictlyIdeas.no  Fact Sheet for Healthcare Providers: BankingDealers.co.za  This test is not yet approved or  cleared by the [place] FDA and has been authorized for detection and/or diagnosis of SARS-CoV-2 by FDA under an Emergency Use Authorization (EUA).  This EUA will remain in effect (meaning this test can be used) for the duration of the COVID-19 declaration under Section 564(b)(1) of the Act, 21 U.S.C. section 360bbb-3(b)(1), unless the authorization is terminated or revoked sooner.  Performed at [facility], [place] [address], [place], [place] [address]   SARS Coronavirus 2 by RT PCR (hospital order, performed in [facility] hospital lab) Nasopharyngeal Nasopharyngeal Swab     Status: None   Collection Time: 08/24/19  8:03 PM   Specimen: Nasopharyngeal Swab  Result Value Ref Range Status   SARS Coronavirus 2 NEGATIVE NEGATIVE Final    Comment: (NOTE) SARS-CoV-2 target nucleic acids are NOT DETECTED.  The SARS-CoV-2 RNA is generally detectable in upper and lower respiratory specimens during the acute phase of infection. The lowest concentration of SARS-CoV-2 viral copies this assay can detect is 250 copies / mL. Allison Vaughan negative result does not preclude SARS-CoV-2 infection and should not be used as the sole basis for treatment or other patient management decisions.  Shakenya Stoneberg negative result may occur with improper specimen collection / handling, submission of specimen other than nasopharyngeal swab, presence of viral mutation(s) within the areas targeted by this assay, and inadequate number of viral copies (<250 copies / mL). Allison Vaughan negative result must be combined with clinical observations, patient history, and epidemiological information.  Fact Sheet for Patients:   StrictlyIdeas.no  Fact Sheet for Healthcare  Providers: BankingDealers.co.za  This test is not yet approved or  cleared by the [place] FDA and has been authorized for detection and/or diagnosis of SARS-CoV-2 by FDA under an Emergency Use Authorization (EUA).  This EUA will remain in effect (meaning this test can be used) for the duration of the COVID-19 declaration under Section 564(b)(1) of the Act, 21 U.S.C. section 360bbb-3(b)(1), unless the authorization is terminated or revoked sooner.  Performed at [facility], [place] [address], [place], [place] [address]   MRSA PCR Screening     Status: Abnormal   Collection Time: 08/25/19  12:02 PM   Specimen: Nasal Mucosa; Nasopharyngeal  Result Value Ref Range Status   MRSA by PCR POSITIVE (Allison Vaughan) NEGATIVE Final    Comment:        The GeneXpert MRSA Assay (FDA approved for NASAL specimens only), is one component of [facility] comprehensive MRSA colonization surveillance program. It is not intended to diagnose MRSA infection nor to guide or monitor treatment for MRSA infections. RESULT CALLED TO, READ BACK BY AND VERIFIED WITH: Allison Vaughan AT 2057 ON 08/25/2019 BY MOSLEY,J Performed at [facility], [place] [address] [place], [place] [address]          Radiology Studies: US RENAL  Result Date: 08/25/2019 CLINICAL DATA:  74 year old female with acute renal insufficiency. EXAM: RENAL / URINARY TRACT ULTRASOUND COMPLETE COMPARISON:  None. FINDINGS: Right Kidney: Renal measurements: 8.3 x 4.2 x 2.8 cm = volume: 68 mL . Echogenicity within normal limits. No mass or hydronephrosis visualized. Left Kidney: Renal measurements: 7.8 x 3.5 x 3.1 cm = volume: 44 mL. Echogenicity within normal limits. No mass or hydronephrosis visualized. Bladder: Appears normal for degree of bladder distention. The patient had voided before the exam. There is approximately 95 cc postvoid residual. Other: None. IMPRESSION: 1. Unremarkable kidneys. 2. Small  postvoid residual. Electronically Signed   By: Anner Crete M.D.   On: 08/25/2019 22:52   DG CHEST PORT 1 VIEW  Result Date: 08/26/2019 CLINICAL DATA:  Shortness of breath EXAM: PORTABLE CHEST 1 VIEW COMPARISON:  Chest radiograph 08/24/2019 FINDINGS: Monitoring leads overlie the patient. Right anterior chest wall [place]-Cath is present with tip projecting over the right atrium. Stable cardiomegaly. Aortic atherosclerosis. Bibasilar heterogeneous opacities. No pleural effusion or pneumothorax. Thoracic spine degenerative changes. IMPRESSION: Bibasilar heterogeneous opacities may represent atelectasis. Electronically Signed   By: Lovey Newcomer M.D.   On: 08/26/2019 09:30        Scheduled Meds: . amLODipine  10 mg Oral Daily  . aspirin EC  81 mg Oral Daily  . atorvastatin  10 mg Oral QPM  . chlorhexidine  15 mL Mouth Rinse BID  . Chlorhexidine Gluconate Cloth  6 each Topical Q0600  . cholecalciferol  2,000 Units Oral Daily  . FLUoxetine  40 mg Oral QPM  . heparin  5,000 Units Subcutaneous Q8H  . hydrALAZINE  50 mg Oral Q8H  . ipratropium-albuterol  3 mL Nebulization TID  . mouth rinse  15 mL Mouth Rinse q12n4p  . metoprolol succinate  50 mg Oral QPM  . mupirocin ointment  1 application Nasal BID  . pantoprazole  40 mg Oral Daily  . polyethylene glycol  17 g Oral Daily  . sodium chloride flush  10-40 mL Intracatheter Q12H   Continuous Infusions: . lactated ringers 75 mL/hr at 08/27/19 1145     LOS: 3 days    Time spent: over 30 min    Fayrene Helper, MD [facility]   To contact the attending provider between 7A-7P or the covering provider during after hours 7P-7A, please log into the web site [email or web address] and access using universal [place] password for that web site. If you do not have the password, please call the hospital operator.  08/27/2019, 4:06 PM

## 2019-08-27 NOTE — Progress Notes (Signed)
Pt states she can not use BIPAP QHS tonight because the mask made her nose sore.  RT to monitor and assess as needed.

## 2019-08-28 ENCOUNTER — Ambulatory Visit: Payer: Medicare HMO

## 2019-08-28 LAB — COMPREHENSIVE METABOLIC PANEL
ALT: 31 U/L (ref 0–44)
AST: 17 U/L (ref 15–41)
Albumin: 3.1 g/dL — ABNORMAL LOW (ref 3.5–5.0)
Alkaline Phosphatase: 76 U/L (ref 38–126)
Anion gap: 7 (ref 5–15)
BUN: 33 mg/dL — ABNORMAL HIGH (ref 8–23)
CO2: 26 mmol/L (ref 22–32)
Calcium: 7.7 mg/dL — ABNORMAL LOW (ref 8.9–10.3)
Chloride: 97 mmol/L — ABNORMAL LOW (ref 98–111)
Creatinine, Ser: 1.65 mg/dL — ABNORMAL HIGH (ref 0.44–1.00)
GFR calc Af Amer: 35 mL/min — ABNORMAL LOW (ref >60)
GFR calc non Af Amer: 30 mL/min — ABNORMAL LOW (ref >60)
Glucose, Bld: 94 mg/dL (ref 70–99)
Potassium: 3.8 mmol/L (ref 3.5–5.1)
Sodium: 130 mmol/L — ABNORMAL LOW (ref 135–145)
Total Bilirubin: 0.4 mg/dL (ref 0.3–1.2)
Total Protein: 5.1 g/dL — ABNORMAL LOW (ref 6.5–8.1)

## 2019-08-28 LAB — CBC WITH DIFFERENTIAL/PLATELET
Abs Immature Granulocytes: 0.06 10*3/uL (ref 0.00–0.07)
Basophils Absolute: 0 10*3/uL (ref 0.0–0.1)
Basophils Relative: 0 %
Eosinophils Absolute: 0 10*3/uL (ref 0.0–0.5)
Eosinophils Relative: 0 %
HCT: 29.6 % — ABNORMAL LOW (ref 36.0–46.0)
Hemoglobin: 9.3 g/dL — ABNORMAL LOW (ref 12.0–15.0)
Immature Granulocytes: 1 %
Lymphocytes Relative: 17 %
Lymphs Abs: 0.9 10*3/uL (ref 0.7–4.0)
MCH: 29.9 pg (ref 26.0–34.0)
MCHC: 31.4 g/dL (ref 30.0–36.0)
MCV: 95.2 fL (ref 80.0–100.0)
Monocytes Absolute: 0.6 10*3/uL (ref 0.1–1.0)
Monocytes Relative: 12 %
Neutro Abs: 3.5 10*3/uL (ref 1.7–7.7)
Neutrophils Relative %: 70 %
Platelets: 157 10*3/uL (ref 150–400)
RBC: 3.11 MIL/uL — ABNORMAL LOW (ref 3.87–5.11)
RDW: 19.4 % — ABNORMAL HIGH (ref 11.5–15.5)
WBC: 5 10*3/uL (ref 4.0–10.5)
nRBC: 0 % (ref 0.0–0.2)

## 2019-08-28 LAB — GLUCOSE, CAPILLARY
Glucose-Capillary: 77 mg/dL (ref 70–99)
Glucose-Capillary: 126 mg/dL — ABNORMAL HIGH (ref 70–99)
Glucose-Capillary: 99 mg/dL (ref 70–99)
Glucose-Capillary: 110 mg/dL — ABNORMAL HIGH (ref 70–99)

## 2019-08-28 LAB — MAGNESIUM: Magnesium: 1.9 mg/dL (ref 1.7–2.4)

## 2019-08-28 LAB — PHOSPHORUS: Phosphorus: 2.5 mg/dL (ref 2.5–4.6)

## 2019-08-28 MED ORDER — IPRATROPIUM-ALBUTEROL 0.5-2.5 (3) MG/3ML IN SOLN
3.0000 mL | Freq: Two times a day (BID) | RESPIRATORY_TRACT | Status: DC
  Administered 2019-08-28 – 2019-08-29 (×2): 3 mL via RESPIRATORY_TRACT
  Filled 2019-08-28 (×2): qty 3

## 2019-08-28 MED ORDER — LACTATED RINGERS IV SOLN: INTRAVENOUS | Status: AC

## 2019-08-28 NOTE — Evaluation (Signed)
Physical Therapy Evaluation Patient Details Name: Allison Vaughan MRN: [id number] DOB: Dec 08, 1945 Today's Date: 08/28/2019   History of Present Illness  74 y.o. female with medical history significant of  CHF, COPD, hypertension, hypercholesterolemia, CAD, A. fib, and pancreatic cancer who presents  08/24/19 with shortness of breath and hypoxia.  According to EMS called to  her house for the3 days PTA, with complaints of  shortness of breath  Clinical Impression  The patient reports up ad lib in room. Patient ambulated x 140' with occassional HHA, no overt balance loss. SPO2 100% post ambulation. No further needs from PT. Patient can ambulate with nursing. PT will sign off.     Follow Up Recommendations No PT follow up    Equipment Recommendations  None recommended by PT    Recommendations for Other Services       Precautions / Restrictions Precautions Precautions: Fall      Mobility  Bed Mobility Overal bed mobility: Independent                Transfers Overall transfer level: Independent                  Ambulation/Gait Ambulation/Gait assistance: Supervision;Min guard Gait Distance (Feet): 140 Feet Assistive device: IV Pole;1 person hand held assist Gait Pattern/deviations: Step-through pattern Gait velocity: slightly decr Gait velocity interpretation: <1.31 ft/sec, indicative of household ambulator General Gait Details: first 19' no noted dyspnea, was SOB to complete 140'. held IV pole then nothing then 1 HH as she go SOB. SPO2 100% RA  Stairs            Wheelchair Mobility    Modified Rankin (Stroke Patients Only)       Balance Overall balance assessment: Mild deficits observed, not formally tested                                           Pertinent Vitals/Pain      Home Living Family/patient expects to be discharged to:: Private residence Living Arrangements: Children Available Help at Discharge: Available 24  hours/day;Family;Friend(s) Type of Home: House Home Access: Level entry;Stairs to enter Entrance Stairs-Rails: None Entrance Stairs-Number of Steps: 2 Home Layout: One level Home Equipment: Environmental consultant - 2 wheels Additional Comments: other family live with patient, they smoke    Prior Function Level of Independence: Independent   Gait / Transfers Assistance Needed: independent PTA           Hand Dominance        Extremity/Trunk Assessment   Upper Extremity Assessment Upper Extremity Assessment: Overall WFL for tasks assessed    Lower Extremity Assessment Lower Extremity Assessment: Overall WFL for tasks assessed    Cervical / Trunk Assessment Cervical / Trunk Assessment: Normal  Communication   Communication: No difficulties  Cognition Arousal/Alertness: Awake/alert Behavior During Therapy: WFL for tasks assessed/performed Overall Cognitive Status: Within Functional Limits for tasks assessed                                        General Comments      Exercises     Assessment/Plan    PT Assessment Patent does not need any further PT services  PT Problem List Decreased activity tolerance       PT Treatment Interventions  PT Goals (Current goals can be found in the Care Plan section)  Acute Rehab PT Goals Patient Stated Goal: i am ready to go home PT Goal Formulation: All assessment and education complete, DC therapy    Frequency     Barriers to discharge        Co-evaluation               AM-PAC PT "6 Clicks" Mobility  Outcome Measure Help needed turning from your back to your side while in a flat bed without using bedrails?: None Help needed moving from lying on your back to sitting on the side of a flat bed without using bedrails?: None Help needed moving to and from a bed to a chair (including a wheelchair)?: None Help needed standing up from a chair using your arms (e.g., wheelchair or bedside chair)?: None Help  needed to walk in hospital room?: None Help needed climbing 3-5 steps with a railing? : A Little 6 Click Score: 23    End of Session   Activity Tolerance: Patient tolerated treatment well Patient left: in bed;with call bell/phone within reach Nurse Communication: Mobility status PT Visit Diagnosis: Difficulty in walking, not elsewhere classified (R26.2)    Time: 7048-8891 PT Time Calculation (min) (ACUTE ONLY): 31 min   Charges:   PT Evaluation $PT Eval Low Complexity: 1 Low PT Treatments $Gait Training: 8-22 mins       [place] Pager [phone] Office [phone]   Claretha Cooper 08/28/2019, 4:11 PM

## 2019-08-28 NOTE — Progress Notes (Signed)
PROGRESS NOTE    Allison Vaughan  VOJ:[id number] DOB: 10-28-1945 DOA: 08/24/2019 PCP: Sandi Mariscal, MD   Chief Complaint  Patient presents with  . Shortness of Breath   Brief Narrative: 74 y.o.femalewith medical history significant ofCHF, COPD, hypertension, hypercholesterolemia, CAD, A. fib, and pancreatic cancer who presents with shortness of breath and hypoxia. According to EMS, patient has had EMS come out to her house for the last 3 days with shortness of breath. Typically, she would get a breathing treatment and refused transport. She reports that today, she was more somnolent and respiratory distress. Fire department found her oxygen saturation to be in the 60s on arrival and she was given steroids and duo nebs prior to arrival. She is feeling better after breathing treatments but is still somnolent and confused from baseline per EMS. She denies any chest pain, fevers, or chills. She does report some mild cough. She denies abdominal pain or back pain. She denies leg pain or leg swelling. Patient will admitted and treated with BiPAP nightly, was treated with steroids, IV Lasix for COPD, heart failure with preserved EF exacerbation, acute metabolic encephalopathy in the setting of benzo and opiate respiratory acidosis.  Her opiates and benzos were held with plan to gradually reintroduce at lower dose. In addition with acute kidney injury downtrending with IV fluids, hyperkalemia started post Lokelma and Lasix, elevated troponin from demand ischemia. 8/13 steroids and Lasix is stopped and being watched.  Has been on Ringer's at 75 mL/h with close monitoring of fluid status.  Subjective:  feels well, no cp, cough or SOB' Overnight, WBC stable 5.oh, hemoglobin 9.3 g, sodium low at 130, creatinine further improving 2.0> 1.6 BUN down 45>33.  Assessment & Plan:  Acute hypoxic and hypercapnic respiratory failure in the setting of COPD/CHF somnolence in the setting of benzo and  narcotic use: Patient is alert oriented x3.  She is off oxygen doing well no shortness of breath.  She is off steroids and Lasix and tolerating a fluid hydration.  HTN, losartan on hold.  BP fairly stable continue hydralazine and amlodipine.  AKI on CKD IIIb: Improving nicely on gentle IV hydration.  Lasix on hold. Recent Labs  Lab 08/24/19 2003 08/25/19 1044 08/26/19 0546 08/27/19 0420 08/28/19 0335  BUN 42* 47* 54* 46* 33*  CREATININE 2.96* 2.80* 2.80* 2.03* 1.65*   Acute exacerbation of HFpEF: Now compensated and euvolemic off Lasix getting gentle IV fluids for AKI.  COPD with hypercapnia: Did not tolerate BiPAP last night.  Elevated troponin suspect to be demand ischemia-low suspicion for ACS EKG no ischemic changes.  Hyperkalemia status post Lokelma and Lasix.  Potassium is stabilized.  Acute metabolic encephalopathy.  He is alert awake oriented x3.  Watch for withdrawal and resume her benzo narcotic slowly Pain management do not recommend going back on 50 mg oxycodone at this time continue oxycodone 5 mg every 4 hours as needed.  Continue to hold Xanax if she does needs to resume it consider low-dose.    Hyponatremia: Monitor and continue IV fluids.  Unresectable pancreatic cancer/chronic pain followed by Dr. Annamaria Boots.  For oncology likely stop chemo and plan for radiation.  Palliative care on board.  Hyperglycemic A1c normal 5.6  DVT prophylaxis: heparin injection 5,000 Units Start: 08/24/19 2330 Code Status:   Code Status: DNR  Family Communication: plan of care discussed with patient at bedside.  Status is: Inpatient  Remains inpatient appropriate because:Inpatient level of care appropriate due to severity of illness   Dispo:  The patient is from: Home              Anticipated d/c is to: Home              Anticipated d/c date is: 1 day              Patient currently is not medically stable to d/c.  PT OT eval for disposition.   Nutrition: Diet Order             Diet Heart Room service appropriate? Yes; Fluid consistency: Thin  Diet effective now                       Body mass index is 23.25 kg/m.  Consultants:see note  Procedures:see note Microbiology:see note Blood Culture    Component Value Date/Time   SDES IN/OUT CATH URINE 08/13/2019 1249   SPECREQUEST NONE 08/13/2019 1249   CULT  08/13/2019 1249    NO GROWTH Performed at [facility], [place] [address] [place], [place] [address]    REPTSTATUS 08/14/2019 FINAL 08/13/2019 1249    Other culture-see note  Medications: Scheduled Meds: . amLODipine  10 mg Oral Daily  . aspirin EC  81 mg Oral Daily  . atorvastatin  10 mg Oral QPM  . chlorhexidine  15 mL Mouth Rinse BID  . Chlorhexidine Gluconate Cloth  6 each Topical Q0600  . cholecalciferol  2,000 Units Oral Daily  . FLUoxetine  40 mg Oral QPM  . heparin  5,000 Units Subcutaneous Q8H  . hydrALAZINE  50 mg Oral Q8H  . ipratropium-albuterol  3 mL Nebulization TID  . mouth rinse  15 mL Mouth Rinse q12n4p  . metoprolol succinate  50 mg Oral QPM  . mupirocin ointment  1 application Nasal BID  . pantoprazole  40 mg Oral Daily  . polyethylene glycol  17 g Oral Daily  . sodium chloride flush  10-40 mL Intracatheter Q12H   Continuous Infusions:   Antimicrobials: Anti-infectives (From admission, onward)   None       Objective: Vitals: Today's Vitals   08/28/19 0507 08/28/19 0624 08/28/19 0735 08/28/19 0758  BP: (!) 168/82     Pulse: 62     Resp: 16     Temp: 98 F (36.7 C)     TempSrc:      SpO2: 96%  98%   Weight:      Height:      PainSc:  5   0-No pain    Intake/Output Summary (Last 24 hours) at 08/28/2019 1019 Last data filed at 08/27/2019 1900 Gross per 24 hour  Intake 480 ml  Output --  Net 480 ml   Filed Weights   08/25/19 0900 08/27/19 0601 08/28/19 0500  Weight: 53.3 kg 54.4 kg 54 kg   Weight change: -0.4 kg   Intake/Output from previous day: 08/13 0701 - 08/14 0700 In: 720  [P.O.:720] Out: -  Intake/Output this shift: No intake/output data recorded.  Examination:  General exam: AAOx3,,NAD, weak appearing. On RA. HEENT:Oral mucosa moist, Ear/Nose WNL grossly,dentition normal. Respiratory system: bilaterally clear, port+, no wheezing or crackles,no use of accessory muscle, non tender. Cardiovascular system: S1 & S2 +, regular, No JVD. Gastrointestinal system: Abdomen soft, NT,ND, BS+. Nervous System:Alert, awake, moving extremities and grossly nonfocal Extremities: No edema, distal peripheral pulses palpable.  Skin: No rashes,no icterus. MSK: Normal muscle bulk,tone, power  Data Reviewed: I have personally reviewed following labs and imaging studies CBC: Recent  Labs  Lab 08/22/19 1427 08/22/19 1427 08/24/19 2003 08/25/19 1044 08/26/19 0546 08/27/19 0420 08/28/19 0335  WBC 10.0   < > 8.2 4.8 7.2 4.8 5.0  NEUTROABS 6.5  --  5.2  --  5.9 3.5 3.5  HGB 10.0*   < > 10.7* 11.5* 9.7* 8.6* 9.3*  HCT 33.6*   < > 34.0* 35.7* 29.3* 27.0* 29.6*  MCV 96.6   < > 95.8 93.5 91.6 92.8 95.2  PLT 222   < > 253 221 187 149* 157   < > = values in this interval not displayed.   Basic Metabolic Panel: Recent Labs  Lab 08/24/19 2003 08/25/19 1044 08/26/19 0546 08/27/19 0420 08/28/19 0335  NA 128* 133* 133* 135 130*  K 5.3* 5.5* 4.7 4.5 3.8  CL 94* 93* 94* 100 97*  CO2 24 25 26 28 26   GLUCOSE 114* 143* 114* 95 94  BUN 42* 47* 54* 46* 33*  CREATININE 2.96* 2.80* 2.80* 2.03* 1.65*  CALCIUM 8.6* 8.9 8.3* 8.0* 7.7*  MG  --   --  2.2 2.1 1.9  PHOS  --   --  5.1* 3.3 2.5   GFR: Estimated Creatinine Clearance: 20.8 mL/min (A) (by C-G formula based on SCr of 1.65 mg/dL (H)). Liver Function Tests: Recent Labs  Lab 08/24/19 2003 08/25/19 1044 08/26/19 0546 08/27/19 0420 08/28/19 0335  AST 44* 34 24 21 17   ALT 55* 52* 42 36 31  ALKPHOS 120 123 91 71 76  BILITOT 0.5 1.1 0.6 0.6 0.4  PROT 6.4* 6.6 5.3* 4.9* 5.1*  ALBUMIN 4.0 4.0 3.2* 3.0* 3.1*   No  results for input(s): LIPASE, AMYLASE in the last 168 hours. No results for input(s): AMMONIA in the last 168 hours. Coagulation Profile: No results for input(s): INR, PROTIME in the last 168 hours. Cardiac Enzymes: No results for input(s): CKTOTAL, CKMB, CKMBINDEX, TROPONINI in the last 168 hours. BNP (last 3 results) No results for input(s): PROBNP in the last 8760 hours. HbA1C: Recent Labs    08/25/19 1431  HGBA1C 5.6   CBG: Recent Labs  Lab 08/27/19 0738 08/27/19 1151 08/27/19 1649 08/27/19 2203 08/28/19 0720  GLUCAP 88 79 123* 101* 77   Lipid Profile: No results for input(s): CHOL, HDL, LDLCALC, TRIG, CHOLHDL, LDLDIRECT in the last 72 hours. Thyroid Function Tests: No results for input(s): TSH, T4TOTAL, FREET4, T3FREE, THYROIDAB in the last 72 hours. Anemia Panel: No results for input(s): VITAMINB12, FOLATE, FERRITIN, TIBC, IRON, RETICCTPCT in the last 72 hours. Sepsis Labs: No results for input(s): PROCALCITON, LATICACIDVEN in the last 168 hours.  Recent Results (from the past 240 hour(s))  SARS Coronavirus 2 by RT PCR (hospital order, performed in [facility] hospital lab) Nasopharyngeal Nasopharyngeal Swab     Status: None   Collection Time: 08/22/19  3:09 PM   Specimen: Nasopharyngeal Swab  Result Value Ref Range Status   SARS Coronavirus 2 NEGATIVE NEGATIVE Final    Comment: (NOTE) SARS-CoV-2 target nucleic acids are NOT DETECTED.  The SARS-CoV-2 RNA is generally detectable in upper and lower respiratory specimens during the acute phase of infection. The lowest concentration of SARS-CoV-2 viral copies this assay can detect is 250 copies / mL. A negative result does not preclude SARS-CoV-2 infection and should not be used as the sole basis for treatment or other patient management decisions.  A negative result may occur with improper specimen collection / handling, submission of specimen other than nasopharyngeal swab, presence of viral mutation(s) within  the areas  targeted by this assay, and inadequate number of viral copies (<250 copies / mL). A negative result must be combined with clinical observations, patient history, and epidemiological information.  Fact Sheet for Patients:   StrictlyIdeas.no  Fact Sheet for Healthcare Providers: BankingDealers.co.za  This test is not yet approved or  cleared by the [place] FDA and has been authorized for detection and/or diagnosis of SARS-CoV-2 by FDA under an Emergency Use Authorization (EUA).  This EUA will remain in effect (meaning this test can be used) for the duration of the COVID-19 declaration under Section 564(b)(1) of the Act, 21 U.S.C. section 360bbb-3(b)(1), unless the authorization is terminated or revoked sooner.  Performed at [facility], [place] [address] [place], [place] [address]   SARS Coronavirus 2 by RT PCR (hospital order, performed in [facility] hospital lab) Nasopharyngeal Nasopharyngeal Swab     Status: None   Collection Time: 08/24/19  8:03 PM   Specimen: Nasopharyngeal Swab  Result Value Ref Range Status   SARS Coronavirus 2 NEGATIVE NEGATIVE Final    Comment: (NOTE) SARS-CoV-2 target nucleic acids are NOT DETECTED.  The SARS-CoV-2 RNA is generally detectable in upper and lower respiratory specimens during the acute phase of infection. The lowest concentration of SARS-CoV-2 viral copies this assay can detect is 250 copies / mL. A negative result does not preclude SARS-CoV-2 infection and should not be used as the sole basis for treatment or other patient management decisions.  A negative result may occur with improper specimen collection / handling, submission of specimen other than nasopharyngeal swab, presence of viral mutation(s) within the areas targeted by this assay, and inadequate number of viral copies (<250 copies / mL). A negative result must be combined with clinical observations, patient  history, and epidemiological information.  Fact Sheet for Patients:   StrictlyIdeas.no  Fact Sheet for Healthcare Providers: BankingDealers.co.za  This test is not yet approved or  cleared by the [place] FDA and has been authorized for detection and/or diagnosis of SARS-CoV-2 by FDA under an Emergency Use Authorization (EUA).  This EUA will remain in effect (meaning this test can be used) for the duration of the COVID-19 declaration under Section 564(b)(1) of the Act, 21 U.S.C. section 360bbb-3(b)(1), unless the authorization is terminated or revoked sooner.  Performed at [facility], [place] [address], [place], [place] [address]   MRSA PCR Screening     Status: Abnormal   Collection Time: 08/25/19 12:02 PM   Specimen: Nasal Mucosa; Nasopharyngeal  Result Value Ref Range Status   MRSA by PCR POSITIVE (A) NEGATIVE Final    Comment:        The GeneXpert MRSA Assay (FDA approved for NASAL specimens only), is one component of a comprehensive MRSA colonization surveillance program. It is not intended to diagnose MRSA infection nor to guide or monitor treatment for MRSA infections. RESULT CALLED TO, READ BACK BY AND VERIFIED WITH: A HAVNER AT 2057 ON 08/25/2019 BY MOSLEY,J Performed at [facility], [place] [address], [place], [place] [address]       Radiology Studies: No results found.   LOS: 4 days   Antonieta Pert, MD [facility]  08/28/2019, 10:19 AM

## 2019-08-29 LAB — BASIC METABOLIC PANEL
Anion gap: 8 (ref 5–15)
BUN: 26 mg/dL — ABNORMAL HIGH (ref 8–23)
CO2: 29 mmol/L (ref 22–32)
Calcium: 8.5 mg/dL — ABNORMAL LOW (ref 8.9–10.3)
Chloride: 103 mmol/L (ref 98–111)
Creatinine, Ser: 1.4 mg/dL — ABNORMAL HIGH (ref 0.44–1.00)
GFR calc Af Amer: 42 mL/min — ABNORMAL LOW (ref >60)
GFR calc non Af Amer: 36 mL/min — ABNORMAL LOW (ref >60)
Glucose, Bld: 87 mg/dL (ref 70–99)
Potassium: 4.5 mmol/L (ref 3.5–5.1)
Sodium: 140 mmol/L (ref 135–145)

## 2019-08-29 LAB — GLUCOSE, CAPILLARY: Glucose-Capillary: 83 mg/dL (ref 70–99)

## 2019-08-29 MED ORDER — HEPARIN SOD (PORK) LOCK FLUSH 100 UNIT/ML IV SOLN
500.0000 [IU] | INTRAVENOUS | Status: AC | PRN
  Administered 2019-08-29: 500 [IU]
  Filled 2019-08-29 (×2): qty 5

## 2019-08-29 MED ORDER — HEPARIN SOD (PORK) LOCK FLUSH 100 UNIT/ML IV SOLN
500.0000 [IU] | INTRAVENOUS | Status: AC | PRN
  Administered 2019-08-29: 500 [IU]
  Filled 2019-08-29: qty 5

## 2019-08-29 MED ORDER — OXYCODONE HCL 5 MG PO TABS: 5.0000 mg | ORAL_TABLET | Freq: Four times a day (QID) | ORAL | 0 refills | Status: DC | PRN

## 2019-08-29 MED ORDER — AMLODIPINE BESYLATE 10 MG PO TABS: 10.0000 mg | ORAL_TABLET | Freq: Every day | ORAL | 0 refills | Status: DC

## 2019-08-29 MED ORDER — AMLODIPINE BESYLATE 10 MG PO TABS: 10.0000 mg | ORAL_TABLET | Freq: Every day | ORAL | 1 refills | Status: DC

## 2019-08-29 MED ORDER — HYDRALAZINE HCL 50 MG PO TABS: 50.0000 mg | ORAL_TABLET | Freq: Three times a day (TID) | ORAL | 0 refills | Status: DC

## 2019-08-29 NOTE — Discharge Instructions (Signed)
Acute Kidney Injury, Adult ° °Acute kidney injury is a sudden worsening of kidney function. The kidneys are organs that have several jobs. They filter the blood to remove waste products and extra fluid. They also maintain a healthy balance of minerals and hormones in the body, which helps control blood pressure and keep bones strong. With this condition, your kidneys do not do their jobs as well as they should. °This condition ranges from mild to severe. Over time it may develop into long-lasting (chronic) kidney disease. Early detection and treatment may prevent acute kidney injury from developing into a chronic condition. °What are the causes? °Common causes of this condition include: °· A problem with blood flow to the kidneys. This may be caused by: °? Low blood pressure (hypotension) or shock. °? Blood loss. °? Heart and blood vessel (cardiovascular) disease. °? Severe burns. °? Liver disease. °· Direct damage to the kidneys. This may be caused by: °? Certain medicines. °? A kidney infection. °? Poisoning. °? Being around or in contact with toxic substances. °? A surgical wound. °? A hard, direct hit to the kidney area. °· A sudden blockage of urine flow. This may be caused by: °? Cancer. °? Kidney stones. °? An enlarged prostate in males. °What are the signs or symptoms? °Symptoms of this condition may not be obvious until the condition becomes severe. Symptoms of this condition can include: °· Tiredness (lethargy), or difficulty staying awake. °· Nausea or vomiting. °· Swelling (edema) of the face, legs, ankles, or feet. °· Problems with urination, such as: °? Abdominal pain, or pain along the side of your stomach (flank). °? Decreased urine production. °? Decrease in the force of urine flow. °· Muscle twitches and cramps, especially in the legs. °· Confusion or trouble concentrating. °· Loss of appetite. °· Fever. °How is this diagnosed? °This condition may be diagnosed with tests, including: °· Blood  tests. °· Urine tests. °· Imaging tests. °· A test in which a sample of tissue is removed from the kidneys to be examined under a microscope (kidney biopsy). °How is this treated? °Treatment for this condition depends on the cause and how severe the condition is. In mild cases, treatment may not be needed. The kidneys may heal on their own. In more severe cases, treatment will involve: °· Treating the cause of the kidney injury. This may involve changing any medicines you are taking or adjusting your dosage. °· Fluids. You may need specialized IV fluids to balance your body's needs. °· Having a catheter placed to drain urine and prevent blockages. °· Preventing problems from occurring. This may mean avoiding certain medicines or procedures that can cause further injury to the kidneys. °In some cases treatment may also require: °· A procedure to remove toxic wastes from the body (dialysis or continuous renal replacement therapy - CRRT). °· Surgery. This may be done to repair a torn kidney, or to remove the blockage from the urinary system. °Follow these instructions at home: °Medicines °· Take over-the-counter and prescription medicines only as told by your health care provider. °· Do not take any new medicines without your health care provider's approval. Many medicines can worsen your kidney damage. °· Do not take any vitamin and mineral supplements without your health care provider's approval. Many nutritional supplements can worsen your kidney damage. °Lifestyle °· If your health care provider prescribed changes to your diet, follow them. You may need to decrease the amount of protein you eat. °· Achieve and maintain a healthy   weight. If you need help with this, ask your health care provider.  Start or continue an exercise plan. Try to exercise at least 30 minutes a day, 5 days a week.  Do not use any tobacco products, such as cigarettes, chewing tobacco, and e-cigarettes. If you need help quitting, ask your  health care provider. General instructions  Keep track of your blood pressure. Report changes in your blood pressure as told by your health care provider.  Stay up to date with immunizations. Ask your health care provider which immunizations you need.  Keep all follow-up visits as told by your health care provider. This is important. Where to find more information  American Association of Kidney Patients: BombTimer.gl  National Kidney Foundation: www.kidney.[place]: https://mathis.com/  [facility]: ? www.lifeoptions.org ? www.kidneyschool.org Contact a health care provider if:  Your symptoms get worse.  You develop new symptoms. Get help right away if:  You develop symptoms of worsening kidney disease, which include: ? Headaches. ? Abnormally dark or light skin. ? Easy bruising. ? Frequent hiccups. ? Chest pain. ? Shortness of breath. ? End of menstruation in women. ? Seizures. ? Confusion or altered mental status. ? Abdominal or back pain. ? Itchiness.  You have a fever.  Your body is producing less urine.  You have pain or bleeding when you urinate. Summary  Acute kidney injury is a sudden worsening of kidney function.  Acute kidney injury can be caused by problems with blood flow to the kidneys, direct damage to the kidneys, and sudden blockage of urine flow.  Symptoms of this condition may not be obvious until it becomes severe. Symptoms may include edema, lethargy, confusion, nausea or vomiting, and problems passing urine.  This condition can usually be diagnosed with blood tests, urine tests, and imaging tests. Sometimes a kidney biopsy is done to diagnose this condition.  Treatment for this condition often involves treating the underlying cause. It is treated with fluids, medicines, dialysis, diet changes, or surgery. This information is not intended to replace advice given to you by your health care provider. Make  sure you discuss any questions you have with your health care provider. Document Revised: 12/13/2016 Document Reviewed: 12/22/2015 Elsevier Patient Education  [place].   COPD and Physical Activity Chronic obstructive pulmonary disease (COPD) is a long-term (chronic) condition that affects the lungs. COPD is a general term that can be used to describe many different lung problems that cause lung swelling (inflammation) and limit airflow, including chronic bronchitis and emphysema. The main symptom of COPD is shortness of breath, which makes it harder to do even simple tasks. This can also make it harder to exercise and be active. Talk with your health care provider about treatments to help you breathe better and actions you can take to prevent breathing problems during physical activity. What are the benefits of exercising with COPD? Exercising regularly is an important part of a healthy lifestyle. You can still exercise and do physical activities even though you have COPD. Exercise and physical activity improve your shortness of breath by increasing blood flow (circulation). This causes your heart to pump more oxygen through your body. Moderate exercise can improve your:  Oxygen use.  Energy level.  Shortness of breath.  Strength in your breathing muscles.  Heart health.  Sleep.  Self-esteem and feelings of self-worth.  Depression, stress, and anxiety levels. Exercise can benefit everyone with COPD. The severity of your disease may affect how hard  you can exercise, especially at first, but everyone can benefit. Talk with your health care provider about how much exercise is safe for you, and which activities and exercises are safe for you. What actions can I take to prevent breathing problems during physical activity?  Sign up for a pulmonary rehabilitation program. This type of program may include: ? Education about lung diseases. ? Exercise classes that teach you how to  exercise and be more active while improving your breathing. This usually involves:  Exercise using your lower extremities, such as a stationary bicycle.  About 30 minutes of exercise, 2 to 5 times per week, for 6 to 12 weeks  Strength training, such as push ups or leg lifts. ? Nutrition education. ? Group classes in which you can talk with others who also have COPD and learn ways to manage stress.  If you use an oxygen tank, you should use it while you exercise. Work with your health care provider to adjust your oxygen for your physical activity. Your resting flow rate is different from your flow rate during physical activity.  While you are exercising: ? Take slow breaths. ? Pace yourself and do not try to go too fast. ? Purse your lips while breathing out. Pursing your lips is similar to a kissing or whistling position. ? If doing exercise that uses a quick burst of effort, such as weight lifting:  Breathe in before starting the exercise.  Breathe out during the hardest part of the exercise (such as raising the weights). Where to find support You can find support for exercising with COPD from:  Your health care provider.  A pulmonary rehabilitation program.  Your local health department or community health programs.  Support groups, online or in-person. Your health care provider may be able to recommend support groups. Where to find more information You can find more information about exercising with COPD from:  American Lung Association: ClassInsider.se.  COPD Foundation: https://www.rivera.net/. Contact a health care provider if:  Your symptoms get worse.  You have chest pain.  You have nausea.  You have a fever.  You have trouble talking or catching your breath.  You want to start a new exercise program or a new activity. Summary  COPD is a general term that can be used to describe many different lung problems that cause lung swelling (inflammation) and limit airflow.  This includes chronic bronchitis and emphysema.  Exercise and physical activity improve your shortness of breath by increasing blood flow (circulation). This causes your heart to provide more oxygen to your body.  Contact your health care provider before starting any exercise program or new activity. Ask your health care provider what exercises and activities are safe for you. This information is not intended to replace advice given to you by your health care provider. Make sure you discuss any questions you have with your health care provider. Document Revised: 04/22/2018 Document Reviewed: 01/23/2017 Elsevier Patient Education  2020 [place].   Chronic Obstructive Pulmonary Disease  Chronic obstructive pulmonary disease (COPD) is a long-term (chronic) condition that affects the lungs. COPD is a general term that can be used to describe many different lung problems that cause lung swelling (inflammation) and limit airflow, including chronic bronchitis and emphysema. If you have COPD, your lung function will probably never return to normal. In most cases, it gets worse over time. However, there are steps you can take to slow the progression of the disease and improve your quality of life. What  are the causes? This condition may be caused by:  Smoking. This is the most common cause.  Certain genes passed down through families. What increases the risk? The following factors may make you more likely to develop this condition:  Secondhand smoke from cigarettes, pipes, or cigars.  Exposure to chemicals and other irritants such as fumes and dust in the work environment.  Chronic lung conditions or infections. What are the signs or symptoms? Symptoms of this condition include:  Shortness of breath, especially during physical activity.  Chronic cough with a large amount of thick mucus. Sometimes the cough may not have any mucus (dry cough).  Wheezing.  Rapid breaths.  Gray or  bluish discoloration (cyanosis) of the skin, especially in your fingers, toes, or lips.  Feeling tired (fatigue).  Weight loss.  Chest tightness.  Frequent infections.  Episodes when breathing symptoms become much worse (exacerbations).  Swelling in the ankles, feet, or legs. This may occur in later stages of the disease. How is this diagnosed? This condition is diagnosed based on:  Your medical history.  A physical exam. You may also have tests, including:  Lung (pulmonary) function tests. This may include a spirometry test, which measures your ability to exhale properly.  Chest X-ray.  CT scan.  Blood tests. How is this treated? This condition may be treated with:  Medicines. These may include inhaled rescue medicines to treat acute exacerbations as well as long-term, or maintenance, medicines to prevent flare-ups of COPD. ? Bronchodilators help treat COPD by dilating the airways to allow increased airflow and make your breathing more comfortable. ? Steroids can reduce airway inflammation and help prevent exacerbations.  Smoking cessation. If you smoke, your health care provider may ask you to quit, and may also recommend therapy or replacement products to help you quit.  Pulmonary rehabilitation. This may involve working with a team of health care providers and specialists, such as respiratory, occupational, and physical therapists.  Exercise and physical activity. These are beneficial for nearly all people with COPD.  Nutrition therapy to gain weight, if you are underweight.  Oxygen. Supplemental oxygen therapy is only helpful if you have a low oxygen level in your blood (hypoxemia).  Lung surgery or transplant.  Palliative care. This is to help people with COPD feel comfortable when treatment is no longer working. Follow these instructions at home: Medicines  Take over-the-counter and prescription medicines (inhaled or pills) only as told by your health care  provider.  Talk to your health care provider before taking any cough or allergy medicines. You may need to avoid certain medicines that dry out your airways. Lifestyle  If you are a smoker, the most important thing that you can do is to stop smoking. Do not use any products that contain nicotine or tobacco, such as cigarettes and e-cigarettes. If you need help quitting, ask your health care provider. Continuing to smoke will cause the disease to progress faster.  Avoid exposure to things that irritate your lungs, such as smoke, chemicals, and fumes.  Stay active, but balance activity with periods of rest. Exercise and physical activity will help you maintain your ability to do things you want to do.  Learn and use relaxation techniques to manage stress and to control your breathing.  Get the right amount of sleep and get quality sleep. Most adults need 7 or more hours per night.  Eat healthy foods. Eating smaller, more frequent meals and resting before meals may help you maintain your strength. Controlled  breathing Learn and use controlled breathing techniques as directed by your health care provider. Controlled breathing techniques include:  Pursed lip breathing. Start by breathing in (inhaling) through your nose for 1 second. Then, purse your lips as if you were going to whistle and breathe out (exhale) through the pursed lips for 2 seconds.  Diaphragmatic breathing. Start by putting one hand on your abdomen just above your waist. Inhale slowly through your nose. The hand on your abdomen should move out. Then purse your lips and exhale slowly. You should be able to feel the hand on your abdomen moving in as you exhale. Controlled coughing Learn and use controlled coughing to clear mucus from your lungs. Controlled coughing is a series of short, progressive coughs. The steps of controlled coughing are: 1. Lean your head slightly forward. 2. Breathe in deeply using diaphragmatic  breathing. 3. Try to hold your breath for 3 seconds. 4. Keep your mouth slightly open while coughing twice. 5. Spit any mucus out into a tissue. 6. Rest and repeat the steps once or twice as needed. General instructions  Make sure you receive all the vaccines that your health care provider recommends, especially the pneumococcal and influenza vaccines. Preventing infection and hospitalization is very important when you have COPD.  Use oxygen therapy and pulmonary rehabilitation if directed to by your health care provider. If you require home oxygen therapy, ask your health care provider whether you should purchase a pulse oximeter to measure your oxygen level at home.  Work with your health care provider to develop a COPD action plan. This will help you know what steps to take if your condition gets worse.  Keep other chronic health conditions under control as told by your health care provider.  Avoid extreme temperature and humidity changes.  Avoid contact with people who have an illness that spreads from person to person (is contagious), such as viral infections or pneumonia.  Keep all follow-up visits as told by your health care provider. This is important. Contact a health care provider if:  You are coughing up more mucus than usual.  There is a change in the color or thickness of your mucus.  Your breathing is more labored than usual.  Your breathing is faster than usual.  You have difficulty sleeping.  You need to use your rescue medicines or inhalers more often than expected.  You have trouble doing routine activities such as getting dressed or walking around the house. Get help right away if:  You have shortness of breath while you are resting.  You have shortness of breath that prevents you from: ? Being able to talk. ? Performing your usual physical activities.  You have chest pain lasting longer than 5 minutes.  Your skin color is more blue (cyanotic) than  usual.  You measure low oxygen saturations for longer than 5 minutes with a pulse oximeter.  You have a fever.  You feel too tired to breathe normally. Summary  Chronic obstructive pulmonary disease (COPD) is a long-term (chronic) condition that affects the lungs.  Your lung function will probably never return to normal. In most cases, it gets worse over time. However, there are steps you can take to slow the progression of the disease and improve your quality of life.  Treatment for COPD may include taking medicines, quitting smoking, pulmonary rehabilitation, and changes to diet and exercise. As the disease progresses, you may need oxygen therapy, a lung transplant, or palliative care.  To help manage your  condition, do not smoke, avoid exposure to things that irritate your lungs, stay up to date on all vaccines, and follow your health care provider's instructions for taking medicines. This information is not intended to replace advice given to you by your health care provider. Make sure you discuss any questions you have with your health care provider. Document Revised: 12/13/2016 Document Reviewed: 02/05/2016 Elsevier Patient Education  [place].   Acute Respiratory Failure, Adult  Acute respiratory failure occurs when there is not enough oxygen passing from your lungs to your body. When this happens, your lungs have trouble removing carbon dioxide from the blood. This causes your blood oxygen level to drop too low as carbon dioxide builds up. Acute respiratory failure is a medical emergency. It can develop quickly, but it is temporary if treated promptly. Your lung capacity, or how much air your lungs can hold, may improve with time, exercise, and treatment. What are the causes? There are many possible causes of acute respiratory failure, including:  Lung injury.  Chest injury or damage to the ribs or tissues near the lungs.  Lung conditions that affect the flow of air  and blood into and out of the lungs, such as pneumonia, acute respiratory distress syndrome, and cystic fibrosis.  Medical conditions, such as strokes or spinal cord injuries, that affect the muscles and nerves that control breathing.  Blood infection (sepsis).  Inflammation of the pancreas (pancreatitis).  A blood clot in the lungs (pulmonary embolism).  A large-volume blood transfusion.  Burns.  Near-drowning.  Seizure.  Smoke inhalation.  Reaction to medicines.  Alcohol or drug overdose. What increases the risk? This condition is more likely to develop in people who have:  A blocked airway.  Asthma.  A condition or disease that damages or weakens the muscles, nerves, bones, or tissues that are involved in breathing.  A serious infection.  A health problem that blocks the unconscious reflex that is involved in breathing, such as hypothyroidism or sleep apnea.  A lung injury or trauma. What are the signs or symptoms? Trouble breathing is the main symptom of acute respiratory failure. Symptoms may also include:  Rapid breathing.  Restlessness or anxiety.  Skin, lips, or fingernails that appear blue (cyanosis).  Rapid heart rate.  Abnormal heart rhythms (arrhythmias).  Confusion or changes in behavior.  Tiredness or loss of energy.  Feeling sleepy or having a loss of consciousness. How is this diagnosed? Your health care provider can diagnose acute respiratory failure with a medical history and physical exam. During the exam, your health care provider will listen to your heart and check for crackling or wheezing sounds in your lungs. Your may also have tests to confirm the diagnosis and determine what is causing respiratory failure. These tests may include:  Measuring the amount of oxygen in your blood (pulse oximetry). The measurement comes from a small device that is placed on your finger, earlobe, or toe.  Other blood tests to measure blood gases and to  look for signs of infection.  Sampling your cerebral spinal fluid or tracheal fluid to check for infections.  Chest X-ray to look for fluid in spaces that should be filled with air.  Electrocardiogram (ECG) to look at the heart's electrical activity. How is this treated? Treatment for this condition usually takes places in a hospital intensive care unit (ICU). Treatment depends on what is causing the condition. It may include one or more treatments until your symptoms improve. Treatment may include:  Supplemental  oxygen. Extra oxygen is given through a tube in the nose, a face mask, or a hood.  A device such as a continuous positive airway pressure (CPAP) or bi-level positive airway pressure (BiPAP or BPAP) machine. This treatment uses mild air pressure to keep the airways open. A mask or other device will be placed over your nose or mouth. A tube that is connected to a motor will deliver oxygen through the mask.  Ventilator. This treatment helps move air into and out of the lungs. This may be done with a bag and mask or a machine. For this treatment, a tube is placed in your windpipe (trachea) so air and oxygen can flow to the lungs.  Extracorporeal membrane oxygenation (ECMO). This treatment temporarily takes over the function of the heart and lungs, supplying oxygen and removing carbon dioxide. ECMO gives the lungs a chance to recover. It may be used if a ventilator is not effective.  Tracheostomy. This is a procedure that creates a hole in the neck to insert a breathing tube.  Receiving fluids and medicines.  Rocking the bed to help breathing. Follow these instructions at home:  Take over-the-counter and prescription medicines only as told by your health care provider.  Return to normal activities as told by your health care provider. Ask your health care provider what activities are safe for you.  Keep all follow-up visits as told by your health care provider. This is  important. How is this prevented? Treating infections and medical conditions that may lead to acute respiratory failure can help prevent the condition from developing. Contact a health care provider if:  You have a fever.  Your symptoms do not improve or they get worse. Get help right away if:  You are having trouble breathing.  You lose consciousness.  Your have cyanosis or turn blue.  You develop a rapid heart rate.  You are confused. These symptoms may represent a serious problem that is an emergency. Do not wait to see if the symptoms will go away. Get medical help right away. Call your local emergency services (911 in the [place] Do not drive yourself to the hospital. This information is not intended to replace advice given to you by your health care provider. Make sure you discuss any questions you have with your health care provider. Document Revised: 12/13/2016 Document Reviewed: 07/19/2015 Elsevier Patient Education  [place].   Chronic Obstructive Pulmonary Disease Exacerbation Chronic obstructive pulmonary disease (COPD) is a long-term (chronic) lung problem. In COPD, the flow of air from the lungs is limited. COPD exacerbations are times that breathing gets worse and you need more than your normal treatment. Without treatment, they can be life threatening. If they happen often, your lungs can become more damaged. If your COPD gets worse, your doctor may treat you with:  Medicines.  Oxygen.  Different ways to clear your airway, such as using a mask. Follow these instructions at home: Medicines  Take over-the-counter and prescription medicines only as told by your doctor.  If you take an antibiotic or steroid medicine, do not stop taking the medicine even if you start to feel better.  Keep up with shots (vaccinations) as told by your doctor. Be sure to get a yearly (annual) flu shot. Lifestyle  Do not smoke. If you need help quitting, ask your  doctor.  Eat healthy foods.  Exercise regularly.  Get plenty of sleep.  Avoid tobacco smoke and other things that can bother your lungs.  Wash  your hands often with soap and water. This will help keep you from getting an infection. If you cannot use soap and water, use hand sanitizer.  During flu season, avoid areas that are crowded with people. General instructions  Drink enough fluid to keep your pee (urine) clear or pale yellow. Do not do this if your doctor has told you not to.  Use a cool mist machine (vaporizer).  If you use oxygen or a machine that turns medicine into a mist (nebulizer), continue to use it as told.  Follow all instructions for rehabilitation. These are steps you can take to make your body work better.  Keep all follow-up visits as told by your doctor. This is important. Contact a doctor if:  Your COPD symptoms get worse than normal. Get help right away if:  You are short of breath and it gets worse.  You have trouble talking.  You have chest pain.  You cough up blood.  You have a fever.  You keep throwing up (vomiting).  You feel weak or you pass out (faint).  You feel confused.  You are not able to sleep because of your symptoms.  You are not able to do daily activities. Summary  COPD exacerbations are times that breathing gets worse and you need more treatment than normal.  COPD exacerbations can be very serious and may cause your lungs to become more damaged.  Do not smoke. If you need help quitting, ask your doctor.  Stay up-to-date on your shots. Get a flu shot every year. This information is not intended to replace advice given to you by your health care provider. Make sure you discuss any questions you have with your health care provider. Document Revised: 12/13/2016 Document Reviewed: 02/05/2016 [employer] Patient Education  2020 [employer].

## 2019-08-29 NOTE — Discharge Summary (Signed)
Physician Discharge Summary  Allison Vaughan:[id number] DOB: January 18, 1945 DOA: 08/24/2019  PCP: Sandi Mariscal, MD  Admit date: 08/24/2019 Discharge date: 08/29/2019  Admitted From: home Disposition:  home  Recommendations for Outpatient Follow-up:  1. Follow up with PCP in 1-2 weeks 2. Please obtain BMP/CBC in one week 3. Please follow up on the following pending results:  Home Health:no  Equipment/Devices: None  Discharge Condition: Stable Code Status:   Code Status: DNR Diet recommendation:  Diet Order            Diet Heart Room service appropriate? Yes; Fluid consistency: Thin  Diet effective now                 Brief/Interim Summary: 74 y.o.femalewith medical history significant ofCHF, COPD, hypertension, hypercholesterolemia, CAD, A. fib, and pancreatic cancer who presents with shortness of breath and hypoxia. According to EMS, patient has had EMS come out to her house for the last 3 days with shortness of breath. Typically, she would get a breathing treatment and refused transport. She reports that today, she was more somnolent and respiratory distress. Fire department found her oxygen saturation to be in the 60s on arrival and she was given steroids and duo nebs prior to arrival. She is feeling better after breathing treatments but is still somnolent and confused from baseline per EMS. She denies any chest pain, fevers, or chills. She does report some mild cough. She denies abdominal pain or back pain. She denies leg pain or leg swelling. Patient will admitted and treated with BiPAP nightly, was treated with steroids, IV Lasix for COPD, heart failure with preserved EF exacerbation, acute metabolic encephalopathy in the setting of benzo and opiate respiratory acidosis.  Her opiates and benzos were held with plan to gradually reintroduce at lower dose. In addition with acute kidney injury downtrending with IV fluids, hyperkalemia started post Lokelma and Lasix, elevated  troponin from demand ischemia. 8/13 steroids and Lasix is stopped and being watched.  Has been on Ringer's at 75 mL/h with close monitoring of fluid status This time she is clinically stabilized.  Off oxygen doing well.  Euvolemic.  No wheezing.  Mental status improved back to baseline.  Off Ativan which she was taking at bedtime as needed at home and now on low-dose oxycodone instead of 15 mg-she will follow with PCP and oncology for further outpatient care.  Discharge Diagnoses:  Active Problems:   HYPERTENSION, BENIGN SYSTEMIC   AKI (acute kidney injury) ([place])   Hyponatremia   Acute respiratory failure with hypercapnia (HCC)   Acute on chronic respiratory failure with hypoxia (HCC)   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HCC)   Acute on chronic diastolic CHF (congestive heart failure) (HCC)   Acute on chronic diastolic (congestive) heart failure ([place])   Advanced care planning/counseling discussion   Palliative care by specialist   DNR (do not resuscitate)  Acute hypoxic and hypercapnic respiratory failure in the setting of COPD/CHF somnolence in the setting of benzo and narcotic use: Patient is alert oriented x3.  She is off oxygen doing well and ambulatory.   HTN, losartan on hold.  BP fairly stable continue hydralazine and amlodipine. Stopped her home losartan  AKI on CKD IIIb: Improved. Bmp in 1 wk. Recent Labs  Lab 08/25/19 1044 08/26/19 0546 08/27/19 0420 08/28/19 0335 08/29/19 0330  BUN 47* 54* 46* 33* 26*  CREATININE 2.80* 2.80* 2.03* 1.65* 1.40*   Acute exacerbation of HFpEF: Now compensated and euvolemic  COPD with hypercapnia:  Did not tolerate BiPAP last night.  Elevated troponin suspect to be demand ischemia-low suspicion for ACS EKG no ischemic changes.  Hyperkalemia status post Lokelma and Lasix.  Potassium is stabilized.off losartan now.  Acute metabolic encephalopathy.   Resolved.  Off benzos and on low-dose narcotics oxycodone. Pain management  do not recommend going back on 15 mg oxycodone at this time continue oxycodone 5 as needed.  Continue to hold ativan and doing well w/o it. F/u with pcp   Hyponatremia:  Improved.    Unresectable pancreatic cancer/chronic pain followed by Dr. Annamaria Boots.  For oncology likely stop chemo and plan for radiation.  Palliative care on board.  Hyperglycemic A1c normal 5.6  Plan of care discussed with her daughter who lives with the patient  Consults:None  Subjective: On RA,  Doing well ' ' I am ready for home doc"  Discharge Exam: Vitals:   08/29/19 0900 08/29/19 0906  BP: (!) 156/66 (!) 156/66  Pulse: 63   Resp:    Temp:    SpO2: 100%    General: Pt is alert, awake, not in acute distress Cardiovascular: RRR, S1/S2 +, no rubs, no gallops Respiratory: CTA bilaterally, no wheezing, no rhonchi Abdominal: Soft, NT, ND, bowel sounds + Extremities: no edema, no cyanosis  Discharge Instructions  Discharge Instructions    Discharge instructions   Complete by: As directed    Please call call MD or return to ER for similar or worsening recurring problem that brought you to hospital or if any fever,nausea/vomiting,abdominal pain, uncontrolled pain, chest pain,  shortness of breath or any other alarming symptoms.  Please follow-up your doctor as instructed in a week time and call the office for appointment.  Please avoid alcohol, smoking, or any other illicit substance and maintain healthy habits including taking your regular medications as prescribed.  You were cared for by a hospitalist during your hospital stay. If you have any questions about your discharge medications or the care you received while you were in the hospital after you are discharged, you can call the unit and ask to speak with the hospitalist on call if the hospitalist that took care of you is not available.  Once you are discharged, your primary care physician will handle any further medical issues. Please note that NO  REFILLS for any discharge medications will be authorized once you are discharged, as it is imperative that you return to your primary care physician (or establish a relationship with a primary care physician if you do not have one) for your aftercare needs so that they can reassess your need for medications and monitor your lab values.   Increase activity slowly   Complete by: As directed      Allergies as of 08/29/2019   No Known Allergies     Medication List    STOP taking these medications   LORazepam 1 MG tablet Commonly known as: ATIVAN   losartan 25 MG tablet Commonly known as: COZAAR   naproxen sodium 220 MG tablet Commonly known as: ALEVE     TAKE these medications   albuterol 108 (90 Base) MCG/ACT inhaler Commonly known as: VENTOLIN HFA Inhale 2 puffs into the lungs every 6 (six) hours as needed for wheezing.   albuterol (2.5 MG/3ML) 0.083% nebulizer solution Commonly known as: PROVENTIL Take 3 mLs (2.5 mg total) by nebulization every 2 (two) hours as needed for wheezing.   amLODipine 10 MG tablet Commonly known as: NORVASC Take 1 tablet (10 mg total) by mouth  daily for 30 doses.   atorvastatin 10 MG tablet Commonly known as: LIPITOR Take 1 tablet (10 mg total) by mouth at bedtime. What changed: when to take this   [facility] HEADACHE PO Take 1 packet by mouth 2 (two) times daily as needed (headaches).   CALCIUM 500 PO Take 500 mg by mouth daily.   FLUoxetine 40 MG capsule Commonly known as: PROZAC Take 40 mg by mouth every evening.   hydrALAZINE 50 MG tablet Commonly known as: APRESOLINE Take 1 tablet (50 mg total) by mouth every 8 (eight) hours.   lidocaine-prilocaine cream Commonly known as: EMLA Apply to affected area as needed weekly   metoprolol succinate 25 MG 24 hr tablet Commonly known as: TOPROL-XL Take 2 tablets (50 mg total) by mouth daily. Take with or immediately following a meal. What changed: when to take this   nitroGLYCERIN 0.4 MG SL  tablet Commonly known as: NITROSTAT Place 1 tablet (0.4 mg total) under the tongue every 5 (five) minutes as needed for chest pain.   omeprazole 40 MG capsule Commonly known as: PRILOSEC Take 40 mg by mouth every evening.   ondansetron 8 MG tablet Commonly known as: Zofran Take 1 tablet (8 mg total) by mouth 2 (two) times daily as needed (Nausea or vomiting).   oxyCODONE 5 MG immediate release tablet Commonly known as: Oxy IR/ROXICODONE Take 1 tablet (5 mg total) by mouth every 6 (six) hours as needed for up to 5 days for severe pain. What changed:   medication strength  how much to take  when to take this  reasons to take this   potassium chloride 10 MEQ tablet Commonly known as: KLOR-CON Take 10 mEq by mouth every evening.   predniSONE 10 MG tablet Commonly known as: DELTASONE Take 4 tablets (40 mg) daily for 2 days, then, Take 3 tablets (30 mg) daily for 2 days, then, Take 2 tablets (20 mg) daily for 2 days, then, Take 1 tablets (10 mg) daily like you were taking before.   predniSONE 10 MG tablet Commonly known as: DELTASONE Take 1 tablet (10 mg total) by mouth daily with breakfast.   prochlorperazine 10 MG tablet Commonly known as: COMPAZINE Take 1 tablet (10 mg total) by mouth every 6 (six) hours as needed (Nausea or vomiting).   Vitamin D3 50 MCG (2000 UT) capsule Take 2,000 Units by mouth daily.       Follow-up Information    Sandi Mariscal, MD Follow up in 1 week(s).   Specialty: Internal Medicine Contact information: [place] [address] [phone]        Pixie Casino, MD .   Specialty: Cardiology Contact information: [place] [address] [phone]              No Known Allergies  The results of significant diagnostics from this hospitalization (including imaging, microbiology, ancillary and laboratory) are listed below for reference.    Microbiology: Recent Results (from the past  240 hour(s))  SARS Coronavirus 2 by RT PCR (hospital order, performed in [facility] hospital lab) Nasopharyngeal Nasopharyngeal Swab     Status: None   Collection Time: 08/22/19  3:09 PM   Specimen: Nasopharyngeal Swab  Result Value Ref Range Status   SARS Coronavirus 2 NEGATIVE NEGATIVE Final    Comment: (NOTE) SARS-CoV-2 target nucleic acids are NOT DETECTED.  The SARS-CoV-2 RNA is generally detectable in upper and lower respiratory specimens during the acute phase of infection. The  lowest concentration of SARS-CoV-2 viral copies this assay can detect is 250 copies / mL. A negative result does not preclude SARS-CoV-2 infection and should not be used as the sole basis for treatment or other patient management decisions.  A negative result may occur with improper specimen collection / handling, submission of specimen other than nasopharyngeal swab, presence of viral mutation(s) within the areas targeted by this assay, and inadequate number of viral copies (<250 copies / mL). A negative result must be combined with clinical observations, patient history, and epidemiological information.  Fact Sheet for Patients:   StrictlyIdeas.no  Fact Sheet for Healthcare Providers: BankingDealers.co.za  This test is not yet approved or  cleared by the [place] FDA and has been authorized for detection and/or diagnosis of SARS-CoV-2 by FDA under an Emergency Use Authorization (EUA).  This EUA will remain in effect (meaning this test can be used) for the duration of the COVID-19 declaration under Section 564(b)(1) of the Act, 21 U.S.C. section 360bbb-3(b)(1), unless the authorization is terminated or revoked sooner.  Performed at [facility], [place] [address] [place], [place] [address]   SARS Coronavirus 2 by RT PCR (hospital order, performed in [facility] hospital lab) Nasopharyngeal Nasopharyngeal Swab     Status: None   Collection  Time: 08/24/19  8:03 PM   Specimen: Nasopharyngeal Swab  Result Value Ref Range Status   SARS Coronavirus 2 NEGATIVE NEGATIVE Final    Comment: (NOTE) SARS-CoV-2 target nucleic acids are NOT DETECTED.  The SARS-CoV-2 RNA is generally detectable in upper and lower respiratory specimens during the acute phase of infection. The lowest concentration of SARS-CoV-2 viral copies this assay can detect is 250 copies / mL. A negative result does not preclude SARS-CoV-2 infection and should not be used as the sole basis for treatment or other patient management decisions.  A negative result may occur with improper specimen collection / handling, submission of specimen other than nasopharyngeal swab, presence of viral mutation(s) within the areas targeted by this assay, and inadequate number of viral copies (<250 copies / mL). A negative result must be combined with clinical observations, patient history, and epidemiological information.  Fact Sheet for Patients:   StrictlyIdeas.no  Fact Sheet for Healthcare Providers: BankingDealers.co.za  This test is not yet approved or  cleared by the [place] FDA and has been authorized for detection and/or diagnosis of SARS-CoV-2 by FDA under an Emergency Use Authorization (EUA).  This EUA will remain in effect (meaning this test can be used) for the duration of the COVID-19 declaration under Section 564(b)(1) of the Act, 21 U.S.C. section 360bbb-3(b)(1), unless the authorization is terminated or revoked sooner.  Performed at [facility], [place] [address], [place], [place] [address]   MRSA PCR Screening     Status: Abnormal   Collection Time: 08/25/19 12:02 PM   Specimen: Nasal Mucosa; Nasopharyngeal  Result Value Ref Range Status   MRSA by PCR POSITIVE (A) NEGATIVE Final    Comment:        The GeneXpert MRSA Assay (FDA approved for NASAL specimens only), is one component of  a comprehensive MRSA colonization surveillance program. It is not intended to diagnose MRSA infection nor to guide or monitor treatment for MRSA infections. RESULT CALLED TO, READ BACK BY AND VERIFIED WITH: A HAVNER AT 2057 ON 08/25/2019 BY MOSLEY,J Performed at [facility], [place] [address], [place], [place] [address]     Procedures/Studies: DG Chest 2 View  Result  Date: 08/13/2019 CLINICAL DATA:  Shortness of breath EXAM: CHEST - 2 VIEW COMPARISON:  07/10/2019 FINDINGS: Right Port-A-Cath in place with the tip at the cavoatrial junction. Cardiomegaly, aortic atherosclerosis. Lungs are clear. No effusions or acute bony abnormality. IMPRESSION: Mild cardiomegaly.  No active disease. Electronically Signed   By: Rolm Baptise M.D.   On: 08/13/2019 08:49   CT Head Wo Contrast  Result Date: 08/22/2019 CLINICAL DATA:  Mental status change. EXAM: CT HEAD WITHOUT CONTRAST TECHNIQUE: Contiguous axial images were obtained from the base of the skull through the vertex without intravenous contrast. COMPARISON:  None. FINDINGS: Brain: No acute intracranial hemorrhage. No focal mass lesion. No CT evidence of acute infarction. No midline shift or mass effect. No hydrocephalus. Basilar cisterns are patent. There are patchy periventricular and subcortical white matter hypodensities. Generalized cortical atrophy. Vascular: No hyperdense vessel or unexpected calcification. Skull: Normal. Negative for fracture or focal lesion. Sinuses/Orbits: Chronic inspissated material filling the RIGHT maxillary sinus. Orbits are normal. Other: None. IMPRESSION: 1. No acute intracranial findings. 2. Chronic patchy subcortical white matter hypodensities. Electronically Signed   By: Suzy Bouchard M.D.   On: 08/22/2019 15:07   US RENAL  Result Date: 08/25/2019 CLINICAL DATA:  74 year old female with acute renal insufficiency. EXAM: RENAL / URINARY TRACT ULTRASOUND COMPLETE COMPARISON:  None. FINDINGS: Right  Kidney: Renal measurements: 8.3 x 4.2 x 2.8 cm = volume: 68 mL . Echogenicity within normal limits. No mass or hydronephrosis visualized. Left Kidney: Renal measurements: 7.8 x 3.5 x 3.1 cm = volume: 44 mL. Echogenicity within normal limits. No mass or hydronephrosis visualized. Bladder: Appears normal for degree of bladder distention. The patient had voided before the exam. There is approximately 95 cc postvoid residual. Other: None. IMPRESSION: 1. Unremarkable kidneys. 2. Small postvoid residual. Electronically Signed   By: Anner Crete M.D.   On: 08/25/2019 22:52   DG CHEST PORT 1 VIEW  Result Date: 08/26/2019 CLINICAL DATA:  Shortness of breath EXAM: PORTABLE CHEST 1 VIEW COMPARISON:  Chest radiograph 08/24/2019 FINDINGS: Monitoring leads overlie the patient. Right anterior chest wall Port-A-Cath is present with tip projecting over the right atrium. Stable cardiomegaly. Aortic atherosclerosis. Bibasilar heterogeneous opacities. No pleural effusion or pneumothorax. Thoracic spine degenerative changes. IMPRESSION: Bibasilar heterogeneous opacities may represent atelectasis. Electronically Signed   By: Lovey Newcomer M.D.   On: 08/26/2019 09:30   DG Chest Port 1 View  Result Date: 08/24/2019 CLINICAL DATA:  Shortness of breath EXAM: PORTABLE CHEST 1 VIEW COMPARISON:  08/22/2019 FINDINGS: Right Port-A-Cath remains in place, unchanged. Heart is borderline in size. Aortic atherosclerosis. No confluent opacities or effusions. No edema. No acute bony abnormality. IMPRESSION: No active disease. Electronically Signed   By: Rolm Baptise M.D.   On: 08/24/2019 20:23   DG Chest Portable 1 View  Result Date: 08/22/2019 CLINICAL DATA:  Patient arrives via EMS from home due to having a suspected overdose. Per ems, the patient was outside with her family when she lost her balance and collapsed on the ground. On the scene, EMS noted that the patient had pinpoint p.*comment was truncated*Unresponsive EXAM: PORTABLE  CHEST 1 VIEW COMPARISON:  08/13/2019 FINDINGS: Port in the anterior chest wall with tip in distal SVC. Normal cardiac silhouette. Lungs are clear. No effusion, infiltrate or pneumothorax. IMPRESSION: No acute cardiopulmonary process. Electronically Signed   By: Suzy Bouchard M.D.   On: 08/22/2019 15:01   ECHOCARDIOGRAM COMPLETE  Result Date: 08/13/2019    ECHOCARDIOGRAM REPORT   Patient Name:  Nino Parsley Date of Exam: 08/13/2019 Medical Rec #:  [id number]          Height:       60.0 in Accession #:    [id number]         Weight:       113.0 lb Date of Birth:  1945/09/04          BSA:          1.464 m Patient Age:    74 years           BP:           194/92 mmHg Patient Gender: F                  HR:           76 bpm. Exam Location:  Inpatient Procedure: 2D Echo Indications:    acute diastolic chf 102.58  History:        Patient has prior history of Echocardiogram examinations, most                 recent 07/09/2018. Cardiomyopathy, CAD, COPD; Risk                 Factors:Dyslipidemia.  Sonographer:    Johny Chess Referring Phys: [id number] [place]  1. Left ventricular ejection fraction, by estimation, is 60 to 65%. The left ventricle has normal function. The left ventricle has no regional wall motion abnormalities. Left ventricular diastolic parameters are consistent with Grade II diastolic dysfunction (pseudonormalization). Elevated left ventricular end-diastolic pressure.  2. Right ventricular systolic function is normal. The right ventricular size is normal. There is mildly elevated pulmonary artery systolic pressure.  3. Left atrial size was moderately dilated.  4. The mitral valve is normal in structure. Mild mitral valve regurgitation. No evidence of mitral stenosis.  5. The aortic valve is normal in structure. Aortic valve regurgitation is trivial. No aortic stenosis is present.  6. The inferior vena cava is normal in size with greater than 50% respiratory variability,  suggesting right atrial pressure of 3 mmHg. FINDINGS  Left Ventricle: Left ventricular ejection fraction, by estimation, is 60 to 65%. The left ventricle has normal function. The left ventricle has no regional wall motion abnormalities. The left ventricular internal cavity size was normal in size. There is  no left ventricular hypertrophy. Left ventricular diastolic parameters are consistent with Grade II diastolic dysfunction (pseudonormalization). Elevated left ventricular end-diastolic pressure. Right Ventricle: The right ventricular size is normal. No increase in right ventricular wall thickness. Right ventricular systolic function is normal. There is mildly elevated pulmonary artery systolic pressure. The tricuspid regurgitant velocity is 3.12  m/s, and with an assumed right atrial pressure of 3 mmHg, the estimated right ventricular systolic pressure is 23.5 mmHg. Left Atrium: Left atrial size was moderately dilated. Right Atrium: Right atrial size was normal in size. Pericardium: There is no evidence of pericardial effusion. Mitral Valve: The mitral valve is normal in structure. Normal mobility of the mitral valve leaflets. Mild mitral valve regurgitation. No evidence of mitral valve stenosis. Tricuspid Valve: The tricuspid valve is normal in structure. Tricuspid valve regurgitation is mild . No evidence of tricuspid stenosis. Aortic Valve: The aortic valve is normal in structure. Aortic valve regurgitation is trivial. No aortic stenosis is present. Pulmonic Valve: The pulmonic valve was normal in structure. Pulmonic valve regurgitation is trivial. No evidence of pulmonic stenosis. Aorta: The aortic root is normal in size and structure.  Venous: The inferior vena cava is normal in size with greater than 50% respiratory variability, suggesting right atrial pressure of 3 mmHg. IAS/Shunts: The interatrial septum appears to be lipomatous. No atrial level shunt detected by color flow Doppler.  LEFT VENTRICLE PLAX 2D  LVIDd:         5.10 cm  Diastology LVIDs:         3.60 cm  LV e' lateral:   9.57 cm/s LV PW:         1.00 cm  LV E/e' lateral: 10.8 LV IVS:        0.70 cm  LV e' medial:    7.07 cm/s LVOT diam:     1.85 cm  LV E/e' medial:  14.6 LV SV:         55 LV SV Index:   38 LVOT Area:     2.69 cm  RIGHT VENTRICLE             IVC RV S prime:     14.40 cm/s  IVC diam: 2.00 cm LEFT ATRIUM             Index       RIGHT ATRIUM           Index LA diam:        4.40 cm 3.00 cm/m  RA Area:     15.40 cm LA Vol (A2C):   58.2 ml 39.74 ml/m RA Volume:   40.10 ml  27.38 ml/m LA Vol (A4C):   59.2 ml 40.42 ml/m LA Biplane Vol: 63.3 ml 43.22 ml/m  AORTIC VALVE LVOT Vmax:   86.60 cm/s LVOT Vmean:  54.900 cm/s LVOT VTI:    0.205 m  AORTA Ao Root diam: 2.50 cm Ao Asc diam:  2.80 cm MITRAL VALVE                TRICUSPID VALVE MV Area (PHT): 5.27 cm     TR Peak grad:   38.9 mmHg MV Decel Time: 144 msec     TR Vmax:        312.00 cm/s MV E velocity: 103.00 cm/s MV A velocity: 90.70 cm/s   SHUNTS MV E/A ratio:  1.14         Systemic VTI:  0.20 m                             Systemic Diam: 1.85 cm Fransico Him MD Electronically signed by Fransico Him MD Signature Date/Time: 08/13/2019/2:49:15 PM    Final     Labs: BNP (last 3 results) Recent Labs    08/13/19 0834 08/24/19 2003  BNP 1,114.0* 149.7*   Basic Metabolic Panel: Recent Labs  Lab 08/25/19 1044 08/26/19 0546 08/27/19 0420 08/28/19 0335 08/29/19 0330  NA 133* 133* 135 130* 140  K 5.5* 4.7 4.5 3.8 4.5  CL 93* 94* 100 97* 103  CO2 25 26 28 26 29   GLUCOSE 143* 114* 95 94 87  BUN 47* 54* 46* 33* 26*  CREATININE 2.80* 2.80* 2.03* 1.65* 1.40*  CALCIUM 8.9 8.3* 8.0* 7.7* 8.5*  MG  --  2.2 2.1 1.9  --   PHOS  --  5.1* 3.3 2.5  --    Liver Function Tests: Recent Labs  Lab 08/24/19 2003 08/25/19 1044 08/26/19 0546 08/27/19 0420 08/28/19 0335  AST 44* 34 24 21 17   ALT 55* 52* 42 36 31  ALKPHOS 120 123 91 71  76  BILITOT 0.5 1.1 0.6 0.6 0.4  PROT 6.4* 6.6  5.3* 4.9* 5.1*  ALBUMIN 4.0 4.0 3.2* 3.0* 3.1*   No results for input(s): LIPASE, AMYLASE in the last 168 hours. No results for input(s): AMMONIA in the last 168 hours. CBC: Recent Labs  Lab 08/22/19 1427 08/22/19 1427 08/24/19 2003 08/25/19 1044 08/26/19 0546 08/27/19 0420 08/28/19 0335  WBC 10.0   < > 8.2 4.8 7.2 4.8 5.0  NEUTROABS 6.5  --  5.2  --  5.9 3.5 3.5  HGB 10.0*   < > 10.7* 11.5* 9.7* 8.6* 9.3*  HCT 33.6*   < > 34.0* 35.7* 29.3* 27.0* 29.6*  MCV 96.6   < > 95.8 93.5 91.6 92.8 95.2  PLT 222   < > 253 221 187 149* 157   < > = values in this interval not displayed.   Cardiac Enzymes: No results for input(s): CKTOTAL, CKMB, CKMBINDEX, TROPONINI in the last 168 hours. BNP: Invalid input(s): POCBNP CBG: Recent Labs  Lab 08/28/19 0720 08/28/19 1124 08/28/19 1634 08/28/19 2134 08/29/19 0745  GLUCAP 77 126* 99 110* 83   D-Dimer No results for input(s): DDIMER in the last 72 hours. Hgb A1c No results for input(s): HGBA1C in the last 72 hours. Lipid Profile No results for input(s): CHOL, HDL, LDLCALC, TRIG, CHOLHDL, LDLDIRECT in the last 72 hours. Thyroid function studies No results for input(s): TSH, T4TOTAL, T3FREE, THYROIDAB in the last 72 hours.  Invalid input(s): FREET3 Anemia work up No results for input(s): VITAMINB12, FOLATE, FERRITIN, TIBC, IRON, RETICCTPCT in the last 72 hours. Urinalysis    Component Value Date/Time   COLORURINE YELLOW 08/25/2019 0015   APPEARANCEUR CLEAR 08/25/2019 0015   LABSPEC 1.006 08/25/2019 0015   PHURINE 5.0 08/25/2019 0015   GLUCOSEU NEGATIVE 08/25/2019 0015   HGBUR NEGATIVE 08/25/2019 0015   BILIRUBINUR NEGATIVE 08/25/2019 0015   KETONESUR NEGATIVE 08/25/2019 0015   PROTEINUR NEGATIVE 08/25/2019 0015   UROBILINOGEN 0.2 04/30/2014 2050   NITRITE NEGATIVE 08/25/2019 0015   LEUKOCYTESUR NEGATIVE 08/25/2019 0015   Sepsis Labs Invalid input(s): PROCALCITONIN,  WBC,  LACTICIDVEN Microbiology Recent Results (from the  past 240 hour(s))  SARS Coronavirus 2 by RT PCR (hospital order, performed in [place] hospital lab) Nasopharyngeal Nasopharyngeal Swab     Status: None   Collection Time: 08/22/19  3:09 PM   Specimen: Nasopharyngeal Swab  Result Value Ref Range Status   SARS Coronavirus 2 NEGATIVE NEGATIVE Final    Comment: (NOTE) SARS-CoV-2 target nucleic acids are NOT DETECTED.  The SARS-CoV-2 RNA is generally detectable in upper and lower respiratory specimens during the acute phase of infection. The lowest concentration of SARS-CoV-2 viral copies this assay can detect is 250 copies / mL. A negative result does not preclude SARS-CoV-2 infection and should not be used as the sole basis for treatment or other patient management decisions.  A negative result may occur with improper specimen collection / handling, submission of specimen other than nasopharyngeal swab, presence of viral mutation(s) within the areas targeted by this assay, and inadequate number of viral copies (<250 copies / mL). A negative result must be combined with clinical observations, patient history, and epidemiological information.  Fact Sheet for Patients:   StrictlyIdeas.no  Fact Sheet for Healthcare Providers: BankingDealers.co.za  This test is not yet approved or  cleared by the [place] FDA and has been authorized for detection and/or diagnosis of SARS-CoV-2 by FDA under an Emergency Use Authorization (EUA).  This EUA will remain  in effect (meaning this test can be used) for the duration of the COVID-19 declaration under Section 564(b)(1) of the Act, 21 U.S.C. section 360bbb-3(b)(1), unless the authorization is terminated or revoked sooner.  Performed at [facility], [place] [address] [place], [place] [address]   SARS Coronavirus 2 by RT PCR (hospital order, performed in [facility] hospital lab) Nasopharyngeal Nasopharyngeal Swab     Status: None    Collection Time: 08/24/19  8:03 PM   Specimen: Nasopharyngeal Swab  Result Value Ref Range Status   SARS Coronavirus 2 NEGATIVE NEGATIVE Final    Comment: (NOTE) SARS-CoV-2 target nucleic acids are NOT DETECTED.  The SARS-CoV-2 RNA is generally detectable in upper and lower respiratory specimens during the acute phase of infection. The lowest concentration of SARS-CoV-2 viral copies this assay can detect is 250 copies / mL. A negative result does not preclude SARS-CoV-2 infection and should not be used as the sole basis for treatment or other patient management decisions.  A negative result may occur with improper specimen collection / handling, submission of specimen other than nasopharyngeal swab, presence of viral mutation(s) within the areas targeted by this assay, and inadequate number of viral copies (<250 copies / mL). A negative result must be combined with clinical observations, patient history, and epidemiological information.  Fact Sheet for Patients:   StrictlyIdeas.no  Fact Sheet for Healthcare Providers: BankingDealers.co.za  This test is not yet approved or  cleared by the [place] FDA and has been authorized for detection and/or diagnosis of SARS-CoV-2 by FDA under an Emergency Use Authorization (EUA).  This EUA will remain in effect (meaning this test can be used) for the duration of the COVID-19 declaration under Section 564(b)(1) of the Act, 21 U.S.C. section 360bbb-3(b)(1), unless the authorization is terminated or revoked sooner.  Performed at [facility], [place] [address] [place], [place] [address]   MRSA PCR Screening     Status: Abnormal   Collection Time: 08/25/19 12:02 PM   Specimen: Nasal Mucosa; Nasopharyngeal  Result Value Ref Range Status   MRSA by PCR POSITIVE (A) NEGATIVE Final    Comment:        The GeneXpert MRSA Assay (FDA approved for NASAL specimens only), is one  component of a comprehensive MRSA colonization surveillance program. It is not intended to diagnose MRSA infection nor to guide or monitor treatment for MRSA infections. RESULT CALLED TO, READ BACK BY AND VERIFIED WITH: A HAVNER AT 2057 ON 08/25/2019 BY MOSLEY,J Performed at [facility], [place] [address] [place],  [address]      Time coordinating discharge: 25 minutes  SIGNED: Antonieta Pert, MD  [facility] 08/29/2019, 11:40 AM  If 7PM-7AM, please contact night-coverage [email or web address]

## 2019-08-29 NOTE — Progress Notes (Addendum)
Discharge instructions explained to patient. Next medication doses are written on her discharge instructions. Port flushed with 10 ml of normal saline and 500 units of Heparin. Patients daughter here to pick up patient and bring her home. Allison Vaughan discharged via wheelchair.

## 2019-08-31 ENCOUNTER — Inpatient Hospital Stay: Payer: Medicare HMO | Admitting: Genetic Counselor

## 2019-08-31 ENCOUNTER — Telehealth: Payer: Self-pay

## 2019-08-31 NOTE — Telephone Encounter (Signed)
Allison Vaughan's daughter, Allison Vaughan called stating that her mom was discharged from the hospital on Sunday and now she has swelling all over.  I told her to call Allison Vaughan's PCP or cardiologist.  I told her this was most likely related to her CHF.  She verbalized understanding.

## 2019-09-01 ENCOUNTER — Telehealth: Payer: Self-pay | Admitting: Internal Medicine

## 2019-09-01 ENCOUNTER — Other Ambulatory Visit: Payer: Self-pay | Admitting: Medical

## 2019-09-01 DIAGNOSIS — Z79899 Other long term (current) drug therapy: Secondary | ICD-10-CM

## 2019-09-01 NOTE — Telephone Encounter (Signed)
This is a NL pt, Dr. Jordan 

## 2019-09-01 NOTE — Telephone Encounter (Signed)
Pt c/o swelling: STAT is pt has developed SOB within 24 hours  1) How much weight have you gained and in what time span? Not Sure   2) If swelling, where is the swelling located? Feet & legs are the worst, hands are a little swollen   3) Are you currently taking a fluid pill? no  4) Are you currently SOB? Pt has COPD so she is not more SOB than normal   5) Do you have a log of your daily weights (if so, list)? no  6) Have you gained 3 pounds in a day or 5 pounds in a week? Not sure. Daughter does not have a scale but she can tell that the patient is visibly swollen   7) Have you traveled recently? No

## 2019-09-01 NOTE — Telephone Encounter (Signed)
Spoke with patient's daughter. She was discharged on 08/24/19. Patient's other daughter said that patient looked "puffy" at discharge that has gotten progressively worse. Amlodipine is new for patient post-discharge. Daughter recalls patient taking a med in the past that caused similar symptoms to what she is having now - per chart review, she has been on amlodipine in the past as well. There are no BP readings or weight readings provided.   Patient had recent echo with normal LVEF, grade 2 diastolic dysfunction  Will defer to Dr. Priscille Loveless to advise if med change is appropriate

## 2019-09-01 NOTE — Telephone Encounter (Signed)
Patient's daughter is returning call.

## 2019-09-01 NOTE — Telephone Encounter (Signed)
Rx has been sent to the pharmacy electronically. ° °

## 2019-09-01 NOTE — Telephone Encounter (Signed)
Left message to call back  

## 2019-09-01 NOTE — Telephone Encounter (Signed)
Patient's losartan was stopped in hospital due to AKI and amlodipine was started.  Scr steadily decreasing through hospital stay while on fluids.  If BMP can be rechecked and Scr normal, consider d/c amlodipine and restarting losartan.  Pending patient BP results.

## 2019-09-02 NOTE — Telephone Encounter (Signed)
Spoke with daughter and advised that BMET will need to be checked in order to potentially change her BP medication. She has appt at [facility] tomorrow and can get it done then. Will notify daughter when results are available and MD has advised on any med changes.

## 2019-09-02 NOTE — Progress Notes (Signed)
Allison Vaughan  Allison Mariscal, Allison Vaughan [place] [address]  DIAGNOSIS: F/u pancreaticcancer and allergic reaction  Oncology History Overview Vaughan  Cancer Staging Primary adenocarcinoma of body of pancreas [facility]) Staging form: Exocrine Pancreas, AJCC 8th Edition - Clinical stage from 07/08/2019: Stage III (cT4, cN1, cM0) - Signed by Truitt Merle, Allison Vaughan on 07/13/2019    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[place])  12/21/2018 Imaging   CT AP w contrast IMPRESSION: 1. Large amount of stool at the level of the rectum. Correlate for symptoms of fecal impaction. 2. Persistent dilatation of the distal pancreatic duct with abrupt cutoff at the level of the pancreatic body. Further evaluation with a nonemergent outpatient contrast enhanced MRI is recommended. 3. Nonobstructive left nephrolithiasis. 4. Subacute anterior right rib fractures.   Aortic Atherosclerosis (ICD10-I70.0).   12/24/2018 Imaging   MRCP IMPRESSION: 1. Suspect a 3.5 cm infiltrating pancreatic body neoplasm causing obstruction of the main pancreatic duct and atrophy of the pancreatic tail. The pancreatic head appears normal. 2. Limited examination due to lack of IV contrast and breathing motion artifact. Difficult to assess the vascular structures for invasion. 3. Recommend endoscopic biopsy. A dedicated high-resolution pancreatic CT scan with contrast may be helpful for further evaluation. 4. No obvious metastatic disease.   12/26/2018 Tumor Marker   CA 19-9: 250    05/05/2019 Procedure   EUS by Dr. Paulita Fujita An irregular mass was identified in the pancreatic body. The mass was hypoechoic. The mass measured 24 mm by 17 mm in maximal cross-sectional diameter. The endosonographic borders were poorlydefined. The remainder of the pancreas was examined. The endosonographic appearance of parenchyma and the upstream pancreatic duct indicated duct dilation. Appeared to be tissue  ingrowth into portions of the dilated pancreatic duct. Fine needle aspiration for cytology was performed.  A few abnormal lymph nodes were visualized in the peripancreatic region. The nodes were oval, hypoechoic and had well defined margins. IMPRESSION - A mass was identified in the pancreatic body. This was staged T3 N1 Mx byendosonographic criteria. Fine needle aspiration performed. 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 - A few abnormal lymph nodes were visualized in the peripancreatic region. - Pancreatic parenchymal abnormalities consisting of edema and stranding were noted in the genu of the pancreas and pancreatic body. - There was no evidence of significant pathology.   05/29/2019 Imaging   CT CAP w contrast IMPRESSION: 1. Interval increase in size of poorly defined, hypoenhancing, infiltrating tumor arising from the body and proximal tail of pancreas. 2. Progressive vascular involvement of the portal vein, portal venous confluence, splenic vein, and celiac artery. 3. No specific features of solid organ metastases or metastatic disease to the chest. 4. Similar appearance of tiny, upper lung zone predominant solid and ground-glass attenuating nodules which may be postinflammatory/infectious in etiology. 5. Aortic atherosclerosis, in addition to 3 vessel coronary artery disease. Please Vaughan that although the presence of coronary artery calcium documents the presence of coronary artery disease, the severity of this disease and any potential stenosis cannot be assessed on this non-gated CT examination. Assessment for potential risk factor modification, dietary therapy or pharmacologic therapy may be warranted, if clinically indicated.   Aortic Atherosclerosis (ICD10-I70.0).   07/08/2019 Procedure   Repeat EUS for biopsy and fiducial placement by Dr. Ardis Hughs   1. Irregularly shaped, heterogeneous, hypoechoic mass was noted in the body of the  pancreas, measuring 2.7cm across. The mass clearly invades the portal vein and  obstructs the main pancreatic duct causing upstream dilation (5-45mm in the body and tail). Preliminary cytology review was positive for malignancy (adenocarcinoma) and then 4 gold fiducials were placed into the mass using the [employer] placement system. 2. CBD was normal, non-dilated. 3. Limited views of the liver, spleen were normal.      07/08/2019 Pathology Results   FINAL MICROSCOPIC DIAGNOSIS:  - Malignant cells consistent with adenocarcinoma    07/08/2019 Cancer Staging   Staging form: Exocrine Pancreas, AJCC 8th Edition - Clinical stage from 07/08/2019: Stage III (cT4, cN1, cM0) - Signed by Truitt Merle, Allison Vaughan on 07/13/2019   07/12/2019 Initial Diagnosis   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[place])   07/23/2019 - 08/06/2019 Chemotherapy   First-line Gemcitabine and Abraxane 2 weeks on/1 week off starting 07/23/19 with Gemcitabine alone for C1. Discontinued after 1 cycle due to intolerance   07/27/2019 Procedure   PAC placement      CURRENT THERAPY: First-line Gemcitabine and Abraxane 2 weeks on/1 week off starting 07/23/19 with Gemcitabine alonefor C1. Discontinued due to intolerance after cycle #1. Referral placed to radiation oncology today (09/03/19)  INTERVAL HISTORY: Allison Vaughan 74 y.o. female returns to the clinic today for a follow-up visit accompanied by her daughter. The patient is status post 1 cycle of chemotherapy.  In the interval since receiving her first cycle of chemotherapy, the patient was seen in the emergency room/hospitalized several times mostly related to chronic medical conditions such as CHF and COPD.  Her most recent hospitalization was from 08/24/2019 to 08/29/2019 after she presented to the emergency room with shortness of breath, hypoxia, and altered mental status.  She was treated for acute hypoxic and hypercapnic respiratory failure in the setting of  COPD/CHFsomnolence in the setting of benzo and narcotic use.  Dr. Burr Medico discussed with the patient and her daughter while in the hospital that she is likely not a good candidate for further chemotherapy due to her intolerance.  Dr. Burr Medico had recommended that she proceed with consolidation radiotherapy.  She discussed with them/reiterated that the patient's cancer is not resectable or curable.  The patient is feeling fine today without any concerning complaints except for lower extremity swelling.  The patient notes some improvement with elevation of her lower legs, particularly upon waking up in the morning. The patient's daughter called her cardiology office yesterday due to the concerns with leg swelling.  They were reportedly told that this is secondary to the adjustment in her BP medications which includes amlodipine and hydralazine.  They recommended that she have her chemistry panel performed, which was being done at our clinic and they will review the results and make further recommendations.  Otherwise, the patient reports her baseline shortness of breath with exertion.  She denies significant cough and denies chest pain.  She denies any jaundice, nausea, vomiting, or diarrhea.  The patient reportedly has constipation likely secondary to her pain medication use.  She has not used any stool softeners or laxatives at this point.  She also reports her chronic pain that extends across her low back bilaterally as well as in her lower abdomen bilaterally.  The patient has been on oxycodone for several years which is managed by her PCP.  The patient only has about 3 tablets left and is concerned because she is approaching the weekend and is concerned she will not be able to get a refill without an appointment with her PCP.  The patient is here today for evaluation and  a more detailed discussion about her treatment plan moving forward.    MEDICAL HISTORY: Past Medical History:  Diagnosis Date  . Accidental  drug overdose   . Acute encephalopathy   . Acute on chronic respiratory failure with hypoxia ([place]) 03/01/2017  . Acute renal failure ([place]) 04/16/2014  . AKI (acute kidney injury) ([place]) 04/30/2014  . Altered mental status 06/27/2012  . Anxiety   . Atrial fibrillation ([place])   . CAD (coronary artery disease)   . CAP (community acquired pneumonia) 04/18/2014  . Cardiomyopathy ([place])   . Chronic hypoxemic respiratory failure ([place]) 06/26/2012  . Chronic pain   . COPD exacerbation ([place]) 06/26/2012  . COPD with acute exacerbation ([place]) 03/01/2017  . DEPRESSIVE DISORDER, NOS 03/13/2006   Qualifier: Diagnosis of  By: Damita Dunnings Allison Vaughan, Phillip Heal    . Edema, peripheral   . Essential hypertension 04/30/2014  . Fall at home, initial encounter 03/01/2017  . GASTROESOPHAGEAL REFLUX, NO ESOPHAGITIS 03/13/2006   Qualifier: Diagnosis of  By: Damita Dunnings Allison Vaughan, Phillip Heal    . HYPERCHOLESTEROLEMIA 03/13/2006   Qualifier: Diagnosis of  By: Damita Dunnings Allison Vaughan, Phillip Heal    . Hyperlipidemia   . HYPERTENSION, BENIGN SYSTEMIC 03/13/2006   Qualifier: Diagnosis of  By: Damita Dunnings Allison Vaughan, Phillip Heal    . Hypokalemia 04/30/2014  . Hyponatremia 04/30/2014  . Hypotension 04/30/2014  . Hypoxemia 06/26/2012  . Hypoxia 03/26/2018  . Irritable bowel syndrome 03/13/2006   Qualifier: Diagnosis of  By: Damita Dunnings Allison Vaughan, Phillip Heal    . Lung nodule 04/16/2014  . MENOPAUSAL SYNDROME 03/13/2006   Qualifier: Diagnosis of  By: Damita Dunnings Allison Vaughan, Phillip Heal    . Mood disorder [facility] 03/13/2006   Qualifier: Diagnosis of  By: Damita Dunnings Allison Vaughan, Phillip Heal    . 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 Nausea 04/30/2014  . Nausea vomiting and diarrhea 04/16/2014  . OSTEOARTHRITIS, MULTI SITES 03/13/2006   Qualifier: Diagnosis of  By: Damita Dunnings Allison Vaughan, Phillip Heal    . Pulmonary nodule 04/30/2014  . Respiratory failure ([place]) 01/22/2017  . TOBACCO DEPENDENCE 03/13/2006   Qualifier: Diagnosis of  By: Damita Dunnings Allison Vaughan, Phillip Heal    . Weight loss     ALLERGIES:  has No Known Allergies.  MEDICATIONS:  Current Outpatient Medications  Medication Sig  Dispense Refill  . albuterol (PROVENTIL HFA;VENTOLIN HFA) 108 (90 BASE) MCG/ACT inhaler Inhale 2 puffs into the lungs every 6 (six) hours as needed for wheezing. 1 Inhaler 2  . albuterol (PROVENTIL) (2.5 MG/3ML) 0.083% nebulizer solution Take 3 mLs (2.5 mg total) by nebulization every 2 (two) hours as needed for wheezing. 75 mL 12  . amLODipine (NORVASC) 10 MG tablet Take 1 tablet (10 mg total) by mouth daily for 30 doses. 30 tablet 0  . Aspirin-Salicylamide-Caffeine (BC HEADACHE PO) Take 1 packet by mouth 2 (two) times daily as needed (headaches).     Marland Kitchen atorvastatin (LIPITOR) 10 MG tablet Take 1 tablet (10 mg total) by mouth at bedtime. (Patient taking differently: Take 10 mg by mouth every evening. ) 30 tablet 0  . Calcium Carbonate (CALCIUM 500 PO) Take 500 mg by mouth daily.    . Cholecalciferol (VITAMIN D3) 50 MCG (2000 UT) capsule Take 2,000 Units by mouth daily.    Marland Kitchen FLUoxetine (PROZAC) 40 MG capsule Take 40 mg by mouth every evening.     . hydrALAZINE (APRESOLINE) 50 MG tablet Take 1 tablet (50 mg total) by mouth every 8 (eight) hours. 90 tablet 0  . lidocaine-prilocaine (EMLA) cream Apply to affected area as needed weekly 30 g 3  .  metoprolol succinate (TOPROL-XL) 25 MG 24 hr tablet TAKE 2 TABLETS EVERY DAY. TAKE WITH OR IMMEDIATELY FOLLOWING A MEAL. 180 tablet 1  . nitroGLYCERIN (NITROSTAT) 0.4 MG SL tablet Place 1 tablet (0.4 mg total) under the tongue every 5 (five) minutes as needed for chest pain. 90 tablet 3  . omeprazole (PRILOSEC) 40 MG capsule Take 40 mg by mouth every evening.    . ondansetron (ZOFRAN) 8 MG tablet Take 1 tablet (8 mg total) by mouth 2 (two) times daily as needed (Nausea or vomiting). 30 tablet 1  . oxyCODONE (OXY IR/ROXICODONE) 5 MG immediate release tablet Take 1 tablet (5 mg total) by mouth every 6 (six) hours as needed for up to 5 days for severe pain. 12 tablet 0  . potassium chloride (KLOR-CON) 10 MEQ tablet Take 10 mEq by mouth every evening.     .  prochlorperazine (COMPAZINE) 10 MG tablet Take 1 tablet (10 mg total) by mouth every 6 (six) hours as needed (Nausea or vomiting). 30 tablet 1   No current facility-administered medications for this visit.    SURGICAL HISTORY:  Past Surgical History:  Procedure Laterality Date  . ABDOMINAL HYSTERECTOMY    . ESOPHAGOGASTRODUODENOSCOPY N/A 07/08/2019   Procedure: ESOPHAGOGASTRODUODENOSCOPY (EGD);  Surgeon: Milus Banister, Allison Vaughan;  Location: [facility];  Service: Endoscopy;  Laterality: N/A;  . ESOPHAGOGASTRODUODENOSCOPY (EGD) WITH PROPOFOL N/A 05/05/2019   Procedure: ESOPHAGOGASTRODUODENOSCOPY (EGD) WITH PROPOFOL;  Surgeon: Arta Silence, Allison Vaughan;  Location: [facility];  Service: Endoscopy;  Laterality: N/A;  . EUS N/A 07/08/2019   Procedure: UPPER ENDOSCOPIC ULTRASOUND (EUS) RADIAL;  Surgeon: Milus Banister, Allison Vaughan;  Location: [facility];  Service: Endoscopy;  Laterality: N/A;  . EYE SURGERY    . FIDUCIAL MARKER PLACEMENT N/A 07/08/2019   Procedure: FIDUCIAL MARKER PLACEMENT;  Surgeon: Milus Banister, Allison Vaughan;  Location: [facility];  Service: Endoscopy;  Laterality: N/A;  . FINE NEEDLE ASPIRATION N/A 05/05/2019   Procedure: FINE NEEDLE ASPIRATION (FNA) LINEAR;  Surgeon: Arta Silence, Allison Vaughan;  Location: [facility];  Service: Endoscopy;  Laterality: N/A;  . FINE NEEDLE ASPIRATION N/A 07/08/2019   Procedure: FINE NEEDLE ASPIRATION (FNA) LINEAR;  Surgeon: Milus Banister, Allison Vaughan;  Location: [facility];  Service: Endoscopy;  Laterality: N/A;  . IR IMAGING GUIDED PORT INSERTION  07/27/2019  . ROTATOR CUFF REPAIR Right 2012  . UPPER ESOPHAGEAL ENDOSCOPIC ULTRASOUND (EUS) N/A 05/05/2019   Procedure: UPPER ESOPHAGEAL ENDOSCOPIC ULTRASOUND (EUS) with FNA;  Surgeon: Arta Silence, Allison Vaughan;  Location: [facility];  Service: Endoscopy;  Laterality: N/A;    REVIEW OF SYSTEMS:   Review of Systems  Constitutional: Positive for fatigue.  Negative for appetite change, chills, fever and unexpected weight change.   HENT: Negative for mouth sores, nosebleeds, sore throat and trouble swallowing.   Eyes: Negative for eye problems and icterus.  Respiratory: Positive for baseline dyspnea on exertion. negative for cough, hemoptysis, and wheezing.   Cardiovascular: Positive for bilateral lower extremity swelling. Negative for chest pain. Gastrointestinal: Positive for constipation and abdominal discomfort which extends across her right and lower left quadrant. negative for diarrhea, nausea and vomiting.  Genitourinary: Negative for bladder incontinence, difficulty urinating, dysuria, frequency and hematuria.   Musculoskeletal: Positive for chronic low back pain. negative for gait problem, neck pain and neck stiffness.  Skin: Negative for itching and rash.  Neurological: Negative for dizziness, extremity weakness, gait problem, headaches, light-headedness and seizures.  Hematological: Negative for adenopathy. Does not bruise/bleed easily.  Psychiatric/Behavioral: Negative for confusion, depression and sleep  disturbance. The patient is not nervous/anxious.     PHYSICAL EXAMINATION:  Blood pressure (!) 161/80, pulse 68, temperature 97.6 F (36.4 C), temperature source Tympanic, resp. rate 20, height 5' (1.524 m), weight 123 lb 4.8 oz (55.9 kg), SpO2 99 %.  ECOG PERFORMANCE STATUS: 2 - Symptomatic, <50% confined to bed  Physical Exam  Constitutional: Oriented to person, place, and time and well-developed, well-nourished, and in no distress.  HENT:  Head: Normocephalic and atraumatic.  Mouth/Throat: Oropharynx is clear and moist. No oropharyngeal exudate.  Eyes: Conjunctivae are normal. Right eye exhibits no discharge. Left eye exhibits no discharge. No scleral icterus.  Neck: Normal range of motion. Neck supple.  Cardiovascular: Normal rate, regular rhythm, normal heart sounds and intact distal pulses.   Pulmonary/Chest: Effort normal and breath sounds normal. No respiratory distress. No wheezes. No rales.   Abdominal: Soft. Bowel sounds are normal. Exhibits no distension and no mass. There is no tenderness.  Musculoskeletal: No tenderness to palpation across the spine.  Some tenderness bilaterally in the paravertebral muscles.  Bilateral lower extremity edema. Normal range of motion.  Lymphadenopathy:    No cervical adenopathy.  Neurological: Alert and oriented to person, place, and time. Exhibits normal muscle tone. Gait normal. Coordination normal.  Skin: Skin is warm and dry. No rash noted. Not diaphoretic. No erythema. No pallor.  Psychiatric: Mood, memory and judgment normal.  Vitals reviewed.  LABORATORY DATA: Lab Results  Component Value Date   WBC 3.7 (L) 09/03/2019   HGB 9.0 (L) 09/03/2019   HCT 27.8 (L) 09/03/2019   MCV 92.1 09/03/2019   PLT 124 (L) 09/03/2019      Chemistry      Component Value Date/Time   NA 137 09/03/2019 1330   K 3.8 09/03/2019 1330   CL 103 09/03/2019 1330   CO2 24 09/03/2019 1330   BUN 13 09/03/2019 1330   CREATININE 1.08 (H) 09/03/2019 1330      Component Value Date/Time   CALCIUM 8.7 (L) 09/03/2019 1330   ALKPHOS 102 09/03/2019 1330   AST 14 (L) 09/03/2019 1330   ALT 13 09/03/2019 1330   BILITOT 0.3 09/03/2019 1330       RADIOGRAPHIC STUDIES:  DG Chest 2 View  Result Date: 08/13/2019 CLINICAL DATA:  Shortness of breath EXAM: CHEST - 2 VIEW COMPARISON:  07/10/2019 FINDINGS: Right Port-A-Cath in place with the tip at the cavoatrial junction. Cardiomegaly, aortic atherosclerosis. Lungs are clear. No effusions or acute bony abnormality. IMPRESSION: Mild cardiomegaly.  No active disease. Electronically Signed   By: Rolm Baptise M.D.   On: 08/13/2019 08:49   CT Head Wo Contrast  Result Date: 08/22/2019 CLINICAL DATA:  Mental status change. EXAM: CT HEAD WITHOUT CONTRAST TECHNIQUE: Contiguous axial images were obtained from the base of the skull through the vertex without intravenous contrast. COMPARISON:  None. FINDINGS: Brain: No acute  intracranial hemorrhage. No focal mass lesion. No CT evidence of acute infarction. No midline shift or mass effect. No hydrocephalus. Basilar cisterns are patent. There are patchy periventricular and subcortical white matter hypodensities. Generalized cortical atrophy. Vascular: No hyperdense vessel or unexpected calcification. Skull: Normal. Negative for fracture or focal lesion. Sinuses/Orbits: Chronic inspissated material filling the RIGHT maxillary sinus. Orbits are normal. Other: None. IMPRESSION: 1. No acute intracranial findings. 2. Chronic patchy subcortical white matter hypodensities. Electronically Signed   By: Suzy Bouchard M.D.   On: 08/22/2019 15:07   US RENAL  Result Date: 08/25/2019 CLINICAL DATA:  73 year old female with acute  renal insufficiency. EXAM: RENAL / URINARY TRACT ULTRASOUND COMPLETE COMPARISON:  None. FINDINGS: Right Kidney: Renal measurements: 8.3 x 4.2 x 2.8 cm = volume: 68 mL . Echogenicity within normal limits. No mass or hydronephrosis visualized. Left Kidney: Renal measurements: 7.8 x 3.5 x 3.1 cm = volume: 44 mL. Echogenicity within normal limits. No mass or hydronephrosis visualized. Bladder: Appears normal for degree of bladder distention. The patient had voided before the exam. There is approximately 95 cc postvoid residual. Other: None. IMPRESSION: 1. Unremarkable kidneys. 2. Small postvoid residual. Electronically Signed   By: Anner Crete M.D.   On: 08/25/2019 22:52   DG CHEST PORT 1 VIEW  Result Date: 08/26/2019 CLINICAL DATA:  Shortness of breath EXAM: PORTABLE CHEST 1 VIEW COMPARISON:  Chest radiograph 08/24/2019 FINDINGS: Monitoring leads overlie the patient. Right anterior chest wall Port-A-Cath is present with tip projecting over the right atrium. Stable cardiomegaly. Aortic atherosclerosis. Bibasilar heterogeneous opacities. No pleural effusion or pneumothorax. Thoracic spine degenerative changes. IMPRESSION: Bibasilar heterogeneous opacities may  represent atelectasis. Electronically Signed   By: Lovey Newcomer M.D.   On: 08/26/2019 09:30   DG Chest Port 1 View  Result Date: 08/24/2019 CLINICAL DATA:  Shortness of breath EXAM: PORTABLE CHEST 1 VIEW COMPARISON:  08/22/2019 FINDINGS: Right Port-A-Cath remains in place, unchanged. Heart is borderline in size. Aortic atherosclerosis. No confluent opacities or effusions. No edema. No acute bony abnormality. IMPRESSION: No active disease. Electronically Signed   By: Rolm Baptise M.D.   On: 08/24/2019 20:23   DG Chest Portable 1 View  Result Date: 08/22/2019 CLINICAL DATA:  Patient arrives via EMS from home due to having a suspected overdose. Per ems, the patient was outside with her family when she lost her balance and collapsed on the ground. On the scene, EMS noted that the patient had pinpoint p.*comment was truncated*Unresponsive EXAM: PORTABLE CHEST 1 VIEW COMPARISON:  08/13/2019 FINDINGS: Port in the anterior chest wall with tip in distal SVC. Normal cardiac silhouette. Lungs are clear. No effusion, infiltrate or pneumothorax. IMPRESSION: No acute cardiopulmonary process. Electronically Signed   By: Suzy Bouchard M.D.   On: 08/22/2019 15:01   ECHOCARDIOGRAM COMPLETE  Result Date: 08/13/2019    ECHOCARDIOGRAM REPORT   Patient Name:   WAYNETTE TOWERS Date of Exam: 08/13/2019 Medical Rec #:  [id number]          Height:       60.0 in Accession #:    [id number]         Weight:       113.0 lb Date of Birth:  Jul 09, 1945          BSA:          1.464 m Patient Age:    48 years           BP:           194/92 mmHg Patient Gender: F                  HR:           76 bpm. Exam Location:  Inpatient Procedure: 2D Echo Indications:    acute diastolic chf 588.50  History:        Patient has prior history of Echocardiogram examinations, most                 recent 07/09/2018. Cardiomyopathy, CAD, COPD; Risk                 Factors:Dyslipidemia.  Sonographer:    Johny Chess Referring Phys: [id number] [place]  1. Left ventricular ejection fraction, by estimation, is 60 to 65%. The left ventricle has normal function. The left ventricle has no regional wall motion abnormalities. Left ventricular diastolic parameters are consistent with Grade II diastolic dysfunction (pseudonormalization). Elevated left ventricular end-diastolic pressure.  2. Right ventricular systolic function is normal. The right ventricular size is normal. There is mildly elevated pulmonary artery systolic pressure.  3. Left atrial size was moderately dilated.  4. The mitral valve is normal in structure. Mild mitral valve regurgitation. No evidence of mitral stenosis.  5. The aortic valve is normal in structure. Aortic valve regurgitation is trivial. No aortic stenosis is present.  6. The inferior vena cava is normal in size with greater than 50% respiratory variability, suggesting right atrial pressure of 3 mmHg. FINDINGS  Left Ventricle: Left ventricular ejection fraction, by estimation, is 60 to 65%. The left ventricle has normal function. The left ventricle has no regional wall motion abnormalities. The left ventricular internal cavity size was normal in size. There is  no left ventricular hypertrophy. Left ventricular diastolic parameters are consistent with Grade II diastolic dysfunction (pseudonormalization). Elevated left ventricular end-diastolic pressure. Right Ventricle: The right ventricular size is normal. No increase in right ventricular wall thickness. Right ventricular systolic function is normal. There is mildly elevated pulmonary artery systolic pressure. The tricuspid regurgitant velocity is 3.12  m/s, and with an assumed right atrial pressure of 3 mmHg, the estimated right ventricular systolic pressure is 96.2 mmHg. Left Atrium: Left atrial size was moderately dilated. Right Atrium: Right atrial size was normal in size. Pericardium: There is no evidence of pericardial effusion. Mitral Valve: The mitral valve is  normal in structure. Normal mobility of the mitral valve leaflets. Mild mitral valve regurgitation. No evidence of mitral valve stenosis. Tricuspid Valve: The tricuspid valve is normal in structure. Tricuspid valve regurgitation is mild . No evidence of tricuspid stenosis. Aortic Valve: The aortic valve is normal in structure. Aortic valve regurgitation is trivial. No aortic stenosis is present. Pulmonic Valve: The pulmonic valve was normal in structure. Pulmonic valve regurgitation is trivial. No evidence of pulmonic stenosis. Aorta: The aortic root is normal in size and structure. Venous: The inferior vena cava is normal in size with greater than 50% respiratory variability, suggesting right atrial pressure of 3 mmHg. IAS/Shunts: The interatrial septum appears to be lipomatous. No atrial level shunt detected by color flow Doppler.  LEFT VENTRICLE PLAX 2D LVIDd:         5.10 cm  Diastology LVIDs:         3.60 cm  LV e' lateral:   9.57 cm/s LV PW:         1.00 cm  LV E/e' lateral: 10.8 LV IVS:        0.70 cm  LV e' medial:    7.07 cm/s LVOT diam:     1.85 cm  LV E/e' medial:  14.6 LV SV:         55 LV SV Index:   38 LVOT Area:     2.69 cm  RIGHT VENTRICLE             IVC RV S prime:     14.40 cm/s  IVC diam: 2.00 cm LEFT ATRIUM             Index       RIGHT ATRIUM  Index LA diam:        4.40 cm 3.00 cm/m  RA Area:     15.40 cm LA Vol (A2C):   58.2 ml 39.74 ml/m RA Volume:   40.10 ml  27.38 ml/m LA Vol (A4C):   59.2 ml 40.42 ml/m LA Biplane Vol: 63.3 ml 43.22 ml/m  AORTIC VALVE LVOT Vmax:   86.60 cm/s LVOT Vmean:  54.900 cm/s LVOT VTI:    0.205 m  AORTA Ao Root diam: 2.50 cm Ao Asc diam:  2.80 cm MITRAL VALVE                TRICUSPID VALVE MV Area (PHT): 5.27 cm     TR Peak grad:   38.9 mmHg MV Decel Time: 144 msec     TR Vmax:        312.00 cm/s MV E velocity: 103.00 cm/s MV A velocity: 90.70 cm/s   SHUNTS MV E/A ratio:  1.14         Systemic VTI:  0.20 m                             Systemic Diam:  1.85 cm Fransico Him Allison Vaughan Electronically signed by Fransico Him Allison Vaughan Signature Date/Time: 08/13/2019/2:49:15 PM    Final      ASSESSMENT/PLAN:  TREANNA DUMLER is a 74 y.o. female with   1. Pancreatic neoplasm,cT4N1M0,unresectable -Sheinitiallypresented with gradual weight loss and abdominal discomfort with low back pain for 6-12 months, CA 19-9 is elevated to 250. 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  -HerEUS/biopsyfrom 07/08/19 showed malignant cells consistent with adenocarcinoma. Dr. Burr Medico previously discussed the general clinical course for pancreatic adenocarcinoma. Due to vascular invasion, she is not a surgical candidatethus her cancer is no longer curable but still treatable to control disease and prolong her life.  -Dr. Burr Medico started her on systemicmoderate intensitychemotherapy with gemcitabine/Abraxane(withsingle agentgemcitabineforcycle 1)on days 1 and 8 q21 daysbeginning 07/23/19,with the plan doing chemo for 3-4 months, followed by SBRT. -Her PAC was placed 07/27/19 -She was hospitalized/seen in the ER multiple times following her first cycle due to chronic medical conditions. Dr. Burr Medico does not believe the patient is a canidate for further chemotherapy. Dr. Burr Medico recommends that she proceed with SBRT.  -Referral to radiation oncology.  -The patient still wishes to follow with Dr. Burr Medico for surveillance. Dr. Burr Medico reiterated that her condition is not curable  -Follow up visit with Dr. Burr Medico in 6 weeks.   2.Comorbidities:COPD,CHF, Anxiety and depression,Afib, CAD, HTN, Insomnia -Hospitalized from 8/10-8/15 -Follows with Cardiology.  Reports her blood pressure medication was recently adjusted.  -Regarding her lower extremity swelling, the patient was encouraged to continue to elevate her lower extremities and increase her  protein intake.  Labs demonstrate some hypoalbuminemia with an albumin of 3.2.  -She was also encouraged to use compression stockings -We will forward her CMP from today's labs to her cardiologist per notes seen in epic from cardiology for further evaluation and recommendations regarding her swelling  3. Chronic Pain Secondary to abdominal bloating and low back pain -Pain managed by PCP -Patient received 20 tablets on 08/29/1999 upon being discharged from the hospital on her oxycodone. -The patient only has 3 tablets left and is concerned because she reportedly requires an appointment with her PCP prior to obtaining refills. -We will refill a 3-day supply of her pain medication  until she can be seen by her PCP  Plan: -Refer to radiation oncology -F/U in 6 weeks -Will refill 3 day supply of oxycodone.  -Encouraged to elevate lower extremities and use compression stockings.  We will continue to follow with cardiology.     Orders Placed This Encounter  Procedures  . CBC with Differential (Cancer Center Only)    Standing Status:   Future    Standing Expiration Date:   09/02/2020  . CMP ([facility] only)    Standing Status:   Future    Standing Expiration Date:   09/02/2020  . Ambulatory referral to Radiation Oncology    Referral Priority:   Routine    Referral Type:   Consultation    Referral Reason:   Specialty Services Required    Requested Specialty:   Radiation Oncology    Number of Visits Requested:   [place], PA-C 09/03/19   Addendum  I have seen the patient, examined her. I agree with the assessment and and plan and have edited the notes.   Allison Vaughan has received two doses of gemcitabine and has been hospitalized twice for dyspnea, CHF and COPD exacerbation. I think her tolerance to chemo is very low. Given the incurable nature of her pancreatic cancer and overall goal of palliative care, I will hold on chemotherapy, and refer her to radiation  oncology for radiation treatment.  We previously discussed in GI tumor board, she is a candidate for SBRT radiation, which I think she can tolerate well.  Benefit and side effects discussed with her, she is interested, will make referral today.  We will continue supportive care, we discussed symptom management, she will f/u with PCP Dr. Nancy Fetter for pain management. I will see her back in 6 weeks. Plan to observe after radiation. All questions were answered.   Truitt Merle  09/03/2019

## 2019-09-03 ENCOUNTER — Inpatient Hospital Stay: Payer: Medicare HMO

## 2019-09-03 ENCOUNTER — Other Ambulatory Visit: Payer: Self-pay

## 2019-09-03 ENCOUNTER — Inpatient Hospital Stay (HOSPITAL_BASED_OUTPATIENT_CLINIC_OR_DEPARTMENT_OTHER): Payer: Medicare HMO | Admitting: Physician Assistant

## 2019-09-03 VITALS — BP 161/80 | HR 68 | Temp 97.6°F | Resp 20 | Ht 60.0 in | Wt 123.3 lb

## 2019-09-03 DIAGNOSIS — C251 Malignant neoplasm of body of pancreas: Secondary | ICD-10-CM

## 2019-09-03 DIAGNOSIS — K8689 Other specified diseases of pancreas: Secondary | ICD-10-CM

## 2019-09-03 DIAGNOSIS — G9340 Encephalopathy, unspecified: Secondary | ICD-10-CM

## 2019-09-03 DIAGNOSIS — C259 Malignant neoplasm of pancreas, unspecified: Secondary | ICD-10-CM

## 2019-09-03 DIAGNOSIS — Z7189 Other specified counseling: Secondary | ICD-10-CM | POA: Diagnosis not present

## 2019-09-03 DIAGNOSIS — Z95828 Presence of other vascular implants and grafts: Secondary | ICD-10-CM

## 2019-09-03 DIAGNOSIS — Z9221 Personal history of antineoplastic chemotherapy: Secondary | ICD-10-CM | POA: Diagnosis not present

## 2019-09-03 LAB — CBC WITH DIFFERENTIAL (CANCER CENTER ONLY)
Abs Immature Granulocytes: 0.02 10*3/uL (ref 0.00–0.07)
Basophils Absolute: 0 10*3/uL (ref 0.0–0.1)
Basophils Relative: 1 %
Eosinophils Absolute: 0.1 10*3/uL (ref 0.0–0.5)
Eosinophils Relative: 3 %
HCT: 27.8 % — ABNORMAL LOW (ref 36.0–46.0)
Hemoglobin: 9 g/dL — ABNORMAL LOW (ref 12.0–15.0)
Immature Granulocytes: 1 %
Lymphocytes Relative: 14 %
Lymphs Abs: 0.5 10*3/uL — ABNORMAL LOW (ref 0.7–4.0)
MCH: 29.8 pg (ref 26.0–34.0)
MCHC: 32.4 g/dL (ref 30.0–36.0)
MCV: 92.1 fL (ref 80.0–100.0)
Monocytes Absolute: 0.5 10*3/uL (ref 0.1–1.0)
Monocytes Relative: 15 %
Neutro Abs: 2.5 10*3/uL (ref 1.7–7.7)
Neutrophils Relative %: 66 %
Platelet Count: 124 10*3/uL — ABNORMAL LOW (ref 150–400)
RBC: 3.02 MIL/uL — ABNORMAL LOW (ref 3.87–5.11)
RDW: 17.2 % — ABNORMAL HIGH (ref 11.5–15.5)
WBC Count: 3.7 10*3/uL — ABNORMAL LOW (ref 4.0–10.5)
nRBC: 0 % (ref 0.0–0.2)

## 2019-09-03 LAB — CMP (CANCER CENTER ONLY)
ALT: 13 U/L (ref 0–44)
AST: 14 U/L — ABNORMAL LOW (ref 15–41)
Albumin: 3.2 g/dL — ABNORMAL LOW (ref 3.5–5.0)
Alkaline Phosphatase: 102 U/L (ref 38–126)
Anion gap: 10 (ref 5–15)
BUN: 13 mg/dL (ref 8–23)
CO2: 24 mmol/L (ref 22–32)
Calcium: 8.7 mg/dL — ABNORMAL LOW (ref 8.9–10.3)
Chloride: 103 mmol/L (ref 98–111)
Creatinine: 1.08 mg/dL — ABNORMAL HIGH (ref 0.44–1.00)
GFR, Est AFR Am: 58 mL/min — ABNORMAL LOW (ref >60)
GFR, Estimated: 50 mL/min — ABNORMAL LOW (ref >60)
Glucose, Bld: 91 mg/dL (ref 70–99)
Potassium: 3.8 mmol/L (ref 3.5–5.1)
Sodium: 137 mmol/L (ref 135–145)
Total Bilirubin: 0.3 mg/dL (ref 0.3–1.2)
Total Protein: 5.9 g/dL — ABNORMAL LOW (ref 6.5–8.1)

## 2019-09-03 LAB — GENETIC SCREENING ORDER

## 2019-09-03 MED ORDER — OXYCODONE HCL 5 MG PO TABS
5.0000 mg | ORAL_TABLET | Freq: Four times a day (QID) | ORAL | 0 refills | Status: AC | PRN
Start: 2019-09-03 — End: 2019-09-08

## 2019-09-03 MED ORDER — HEPARIN SOD (PORK) LOCK FLUSH 100 UNIT/ML IV SOLN
500.0000 [IU] | Freq: Once | INTRAVENOUS | Status: AC
  Administered 2019-09-03: 500 [IU]
  Filled 2019-09-03: qty 5

## 2019-09-03 MED ORDER — SODIUM CHLORIDE 0.9% FLUSH
10.0000 mL | Freq: Once | INTRAVENOUS | Status: AC
  Administered 2019-09-03: 10 mL
  Filled 2019-09-03: qty 10

## 2019-09-03 NOTE — Telephone Encounter (Signed)
Patient's daughter is calling to see if someone else can look at her labs done at the [facility] since Dr. Debara Pickett is not in. Please call.

## 2019-09-04 LAB — CANCER ANTIGEN 19-9: CA 19-9: 402 U/mL — ABNORMAL HIGH (ref 0–35)

## 2019-09-06 MED ORDER — LOSARTAN POTASSIUM 25 MG PO TABS: 25.0000 mg | ORAL_TABLET | Freq: Every day | ORAL | 3 refills | Status: DC

## 2019-09-06 NOTE — Telephone Encounter (Signed)
Per Dr. Debara Pickett secure chat, resume losartan and low/previous dose of 25mg  daily. Communicated with this daughter. She reports patient has LE edema that is uncomfortable. Advised we schedule her for a visit - 09/15/2019 with Daleen Snook PA

## 2019-09-06 NOTE — Telephone Encounter (Signed)
Labs done. Sent to [facility] to advise on dose change from CCB to ARB

## 2019-09-06 NOTE — Addendum Note (Signed)
Addended by: Fidel Levy on: 09/06/2019 04:24 PM   Modules accepted: Orders

## 2019-09-07 ENCOUNTER — Telehealth: Payer: Self-pay | Admitting: Internal Medicine

## 2019-09-07 NOTE — Telephone Encounter (Signed)
Ok thanks, just checking

## 2019-09-07 NOTE — Telephone Encounter (Signed)
Scheduled per los. Called and spoke with patients daughter, [name]. Confirmed appt

## 2019-09-07 NOTE — Telephone Encounter (Signed)
Has patient discontinued the amlodipine?

## 2019-09-08 NOTE — Progress Notes (Signed)
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   Allison Vaughan presented  EUS 07/08/2019: malignant cells consistent with adenocarcinoma.  CT CAP 05/29/2019: Interval increase in size of poorly defined, hypoenhancing, infiltrating tumor arising from the body and proximal tail of pancreas.  No specific features of solid organ metastases or metastatic disease to the chest.  Biopsies of Pancreas Body 07/08/2019    Past/Anticipated interventions by surgeon, if any:   Past/Anticipated interventions by medical oncology, if any:  PA Heilingoetter 09/03/2019 -Chemotherapy 7/9-7/23/2021- discontinued due to intolerance. -Dr. Burr Medico discussed no more chemotherapy due to intolerance.  She recommends that she proceed with consolidation radiotherapy. -She discussed with them/reiterated that the patient's cancer is not resectable or curable due to vascular invasion -She was hospitalized/seen in the ER multiple times following her first cycle due to chronic medical conditions. Dr. Burr Medico does not believe the patient is a canidate for further chemotherapy. Dr. Burr Medico recommends that she proceed with SBRT.  -Referral to radiation oncology.  -The patient still wishes to follow with Dr. Burr Medico for surveillance. Dr. Burr Medico reiterated that her condition is not curable   Dr. Burr Medico 09/03/2019 Allison Vaughan has received two doses of gemcitabine and has been hospitalized twice for dyspnea, CHF and COPD exacerbation. I think her tolerance to chemo is very low. Given the incurable nature of her pancreatic cancer and overall goal of palliative care, I will hold on chemotherapy, and refer her to radiation oncology for radiation treatment.  We previously discussed in GI tumor board, she is a candidate for SBRT radiation,  which I think she can tolerate well.   Weight changes, if any: None, states her appetite has been good and her weight has been stable.  Bowel/Bladder complaints, if any: Has constipation.  Taking metamucil  Nausea / Vomiting, if any: Yes  Pain issues, if any:  Has constant lower back pain.  She has intermittent abdominal pain.  Any blood per rectum: No  SAFETY ISSUES:  Prior radiation? No  Pacemaker/ICD? No   Possible current pregnancy? Hysterectomy  Is the patient on methotrexate? No  Current Complaints/Details:

## 2019-09-09 ENCOUNTER — Encounter: Payer: Self-pay | Admitting: Radiation Oncology

## 2019-09-09 ENCOUNTER — Ambulatory Visit
Admission: RE | Admit: 2019-09-09 | Discharge: 2019-09-09 | Disposition: A | Discharge status: Home / Self Care | Payer: Medicare HMO | Source: Ambulatory Visit | Attending: Radiation Oncology | Admitting: Radiation Oncology

## 2019-09-09 ENCOUNTER — Other Ambulatory Visit: Payer: Self-pay

## 2019-09-09 VITALS — Ht 60.0 in | Wt 120.0 lb

## 2019-09-09 DIAGNOSIS — C251 Malignant neoplasm of body of pancreas: Secondary | ICD-10-CM

## 2019-09-09 NOTE — Progress Notes (Signed)
Radiation Oncology         [phone] ________________________________  Name: Allison Vaughan        MRN: [id number]  Date of Service: 09/09/2019 DOB: 01-07-1946  CC:Sun, Mikeal Hawthorne, MD  Truitt Merle, MD     REFERRING PHYSICIAN: Truitt Merle, MD   DIAGNOSIS: The encounter diagnosis was 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[place]).   HISTORY OF PRESENT ILLNESS: Allison Vaughan is a 74 y.o. female seen at the request of Dr. Burr Medico for a history of adenocarcinoma of the pancreas.  The patient was suspected is having a pancreatic cancer in December 2020 however work-up did not confirm this by pathology, she had repeat imaging which revealed an increase in the size of the tumor in the body and proximal tail of the pancreas and progressive vascular involvement of the portal vein portal venous confluence splenic vein and celiac artery.  Repeat endoscopic ultrasound on 07/08/2019 by Dr. Ardis Hughs revealed an irregularly shaped heterogeneous hypoechoic echoic mass in the body of the pancreas measuring 2.7 cm clearly invading the portal vein and obstructing the main pancreatic duct.  Cytology was performed and consistent with adenocarcinoma for fiducial markers were placed in the mass at that time, the patient was counseled on the rationale for systemic chemotherapy which she began with first-line Gemzar Abraxane on 07/23/2019.  She was unable to continue past cycle #1 due to intolerance including hospitalization with congestive heart failure.  Her case has been discussed in GI oncology conference, and she is contacted today to discuss options of stereotactic body radiotherapy.    PREVIOUS RADIATION THERAPY: No   PAST MEDICAL HISTORY:  Past Medical History:  Diagnosis Date  . Accidental drug overdose   . Acute encephalopathy   . Acute on chronic respiratory failure with hypoxia ([place]) 03/01/2017  . Acute renal failure ([place]) 04/16/2014  . AKI (acute kidney injury) ([place]) 04/30/2014  . Altered mental status  06/27/2012  . Anxiety   . Atrial fibrillation ([place])   . CAD (coronary artery disease)   . CAP (community acquired pneumonia) 04/18/2014  . Cardiomyopathy ([place])   . Chronic hypoxemic respiratory failure ([place]) 06/26/2012  . Chronic pain   . COPD exacerbation ([place]) 06/26/2012  . COPD with acute exacerbation ([place]) 03/01/2017  . DEPRESSIVE DISORDER, NOS 03/13/2006   Qualifier: Diagnosis of  By: Damita Dunnings MD, Phillip Heal    . Edema, peripheral   . Essential hypertension 04/30/2014  . Fall at home, initial encounter 03/01/2017  . GASTROESOPHAGEAL REFLUX, NO ESOPHAGITIS 03/13/2006   Qualifier: Diagnosis of  By: Damita Dunnings MD, Phillip Heal    . HYPERCHOLESTEROLEMIA 03/13/2006   Qualifier: Diagnosis of  By: Damita Dunnings MD, Phillip Heal    . Hyperlipidemia   . HYPERTENSION, BENIGN SYSTEMIC 03/13/2006   Qualifier: Diagnosis of  By: Damita Dunnings MD, Phillip Heal    . Hypokalemia 04/30/2014  . Hyponatremia 04/30/2014  . Hypotension 04/30/2014  . Hypoxemia 06/26/2012  . Hypoxia 03/26/2018  . Irritable bowel syndrome 03/13/2006   Qualifier: Diagnosis of  By: Damita Dunnings MD, Phillip Heal    . Lung nodule 04/16/2014  . MENOPAUSAL SYNDROME 03/13/2006   Qualifier: Diagnosis of  By: Damita Dunnings MD, Phillip Heal    . Mood disorder [facility]) 03/13/2006   Qualifier: Diagnosis of  By: Damita Dunnings MD, Phillip Heal    . 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 Nausea 04/30/2014  . Nausea vomiting and diarrhea 04/16/2014  . OSTEOARTHRITIS, MULTI SITES 03/13/2006   Qualifier: Diagnosis of  By: Damita Dunnings MD, Phillip Heal    . Pulmonary nodule 04/30/2014  .  Respiratory failure ([place]) 01/22/2017  . TOBACCO DEPENDENCE 03/13/2006   Qualifier: Diagnosis of  By: Damita Dunnings MD, Phillip Heal    . Weight loss        PAST SURGICAL HISTORY: Past Surgical History:  Procedure Laterality Date  . ABDOMINAL HYSTERECTOMY    . ESOPHAGOGASTRODUODENOSCOPY N/A 07/08/2019   Procedure: ESOPHAGOGASTRODUODENOSCOPY (EGD);  Surgeon: Milus Banister, MD;  Location: [facility];  Service: Endoscopy;  Laterality: N/A;  . ESOPHAGOGASTRODUODENOSCOPY  (EGD) WITH PROPOFOL N/A 05/05/2019   Procedure: ESOPHAGOGASTRODUODENOSCOPY (EGD) WITH PROPOFOL;  Surgeon: Arta Silence, MD;  Location: [facility];  Service: Endoscopy;  Laterality: N/A;  . EUS N/A 07/08/2019   Procedure: UPPER ENDOSCOPIC ULTRASOUND (EUS) RADIAL;  Surgeon: Milus Banister, MD;  Location: [facility];  Service: Endoscopy;  Laterality: N/A;  . EYE SURGERY    . FIDUCIAL MARKER PLACEMENT N/A 07/08/2019   Procedure: FIDUCIAL MARKER PLACEMENT;  Surgeon: Milus Banister, MD;  Location: [facility];  Service: Endoscopy;  Laterality: N/A;  . FINE NEEDLE ASPIRATION N/A 05/05/2019   Procedure: FINE NEEDLE ASPIRATION (FNA) LINEAR;  Surgeon: Arta Silence, MD;  Location: [facility];  Service: Endoscopy;  Laterality: N/A;  . FINE NEEDLE ASPIRATION N/A 07/08/2019   Procedure: FINE NEEDLE ASPIRATION (FNA) LINEAR;  Surgeon: Milus Banister, MD;  Location: [facility];  Service: Endoscopy;  Laterality: N/A;  . IR IMAGING GUIDED PORT INSERTION  07/27/2019  . ROTATOR CUFF REPAIR Right 2012  . UPPER ESOPHAGEAL ENDOSCOPIC ULTRASOUND (EUS) N/A 05/05/2019   Procedure: UPPER ESOPHAGEAL ENDOSCOPIC ULTRASOUND (EUS) with FNA;  Surgeon: Arta Silence, MD;  Location: [facility];  Service: Endoscopy;  Laterality: N/A;     FAMILY HISTORY:  Family History  Problem Relation Age of Onset  . CAD Father   . Lung cancer Brother   . Cancer Brother        lung ca  . Cancer Brother        possible pancreas cancer   . Healthy Daughter   . Healthy Daughter   . Cancer Maternal Grandmother        breast  . Cancer Paternal Grandmother        breast     SOCIAL HISTORY:  reports that she has been smoking cigarettes. She has a 75.00 pack-year smoking history. She has never used smokeless tobacco. She reports previous alcohol use. She reports that she does not use drugs.  The patient is single and lives in [place] but is currently staying with her daughter in [place].   ALLERGIES: Patient has  no known allergies.   MEDICATIONS:  Current Outpatient Medications  Medication Sig Dispense Refill  . albuterol (PROVENTIL HFA;VENTOLIN HFA) 108 (90 BASE) MCG/ACT inhaler Inhale 2 puffs into the lungs every 6 (six) hours as needed for wheezing. 1 Inhaler 2  . albuterol (PROVENTIL) (2.5 MG/3ML) 0.083% nebulizer solution Take 3 mLs (2.5 mg total) by nebulization every 2 (two) hours as needed for wheezing. 75 mL 12  . Aspirin-Salicylamide-Caffeine (BC HEADACHE PO) Take 1 packet by mouth 2 (two) times daily as needed (headaches).     Marland Kitchen atorvastatin (LIPITOR) 10 MG tablet Take 1 tablet (10 mg total) by mouth at bedtime. (Patient taking differently: Take 10 mg by mouth every evening. ) 30 tablet 0  . Calcium Carbonate (CALCIUM 500 PO) Take 500 mg by mouth daily.    . Cholecalciferol (VITAMIN D3) 50 MCG (2000 UT) capsule Take 2,000 Units by mouth daily.    Marland Kitchen FLUoxetine (PROZAC) 40 MG capsule Take 40 mg  by mouth every evening.     . hydrALAZINE (APRESOLINE) 50 MG tablet Take 1 tablet (50 mg total) by mouth every 8 (eight) hours. 90 tablet 0  . lidocaine-prilocaine (EMLA) cream Apply to affected area as needed weekly 30 g 3  . losartan (COZAAR) 25 MG tablet Take 1 tablet (25 mg total) by mouth daily. 90 tablet 3  . metoprolol succinate (TOPROL-XL) 25 MG 24 hr tablet TAKE 2 TABLETS EVERY DAY. TAKE WITH OR IMMEDIATELY FOLLOWING A MEAL. 180 tablet 1  . nitroGLYCERIN (NITROSTAT) 0.4 MG SL tablet Place 1 tablet (0.4 mg total) under the tongue every 5 (five) minutes as needed for chest pain. 90 tablet 3  . omeprazole (PRILOSEC) 40 MG capsule Take 40 mg by mouth every evening.    . ondansetron (ZOFRAN) 8 MG tablet Take 1 tablet (8 mg total) by mouth 2 (two) times daily as needed (Nausea or vomiting). 30 tablet 1  . potassium chloride (KLOR-CON) 10 MEQ tablet Take 10 mEq by mouth every evening.     . prochlorperazine (COMPAZINE) 10 MG tablet Take 1 tablet (10 mg total) by mouth every 6 (six) hours as needed  (Nausea or vomiting). 30 tablet 1   No current facility-administered medications for this encounter.     REVIEW OF SYSTEMS: On review of systems, the patient reports that she is doing okay overall.  She reports that she wakes up most mornings with abdominal pain, this subsides with some of her pain medication, yesterday she noted with episode of nausea, and has been using her prescription antiemetics.  She denies any shortness of breath but states that she has been weak and somewhat fatigued overall in the process.  She denies any shortness of breath at rest but at times has had some shortness of breath with exertion since coming home from the hospital.  There has been unintentional weight loss complicated to some degree by her congestive heart failure following chemotherapy.  A complete review of systems is obtained and is otherwise negative.     PHYSICAL EXAM:  Wt Readings from Last 3 Encounters:  09/09/19 120 lb (54.4 kg)  09/03/19 123 lb 4.8 oz (55.9 kg)  08/29/19 132 lb 15 oz (60.3 kg)   Unable to assess due to encounter type.  ECOG = 1  0 - Asymptomatic (Fully active, able to carry on all predisease activities without restriction)  1 - Symptomatic but completely ambulatory (Restricted in physically strenuous activity but ambulatory and able to carry out work of a light or sedentary nature. For example, light housework, office work)  2 - Symptomatic, <50% in bed during the day (Ambulatory and capable of all self care but unable to carry out any work activities. Up and about more than 50% of waking hours)  3 - Symptomatic, >50% in bed, but not bedbound (Capable of only limited self-care, confined to bed or chair 50% or more of waking hours)  4 - Bedbound (Completely disabled. Cannot carry on any self-care. Totally confined to bed or chair)  5 - Death   Allison Vaughan MM, Creech RH, Tormey DC, et al. [phone]). "Toxicity and response criteria of the [facility]". [place] Oncol. 5 (6): 649-55    LABORATORY DATA:  Lab Results  Component Value Date   WBC 3.7 (L) 09/03/2019   HGB 9.0 (L) 09/03/2019   HCT 27.8 (L) 09/03/2019   MCV 92.1 09/03/2019   PLT 124 (L) 09/03/2019   Lab Results  Component Value Date  NA 137 09/03/2019   K 3.8 09/03/2019   CL 103 09/03/2019   CO2 24 09/03/2019   Lab Results  Component Value Date   ALT 13 09/03/2019   AST 14 (L) 09/03/2019   ALKPHOS 102 09/03/2019   BILITOT 0.3 09/03/2019      RADIOGRAPHY: DG Chest 2 View  Result Date: 08/13/2019 CLINICAL DATA:  Shortness of breath EXAM: CHEST - 2 VIEW COMPARISON:  07/10/2019 FINDINGS: Right Port-A-Cath in place with the tip at the cavoatrial junction. Cardiomegaly, aortic atherosclerosis. Lungs are clear. No effusions or acute bony abnormality. IMPRESSION: Mild cardiomegaly.  No active disease. Electronically Signed   By: Rolm Baptise M.D.   On: 08/13/2019 08:49   CT Head Wo Contrast  Result Date: 08/22/2019 CLINICAL DATA:  Mental status change. EXAM: CT HEAD WITHOUT CONTRAST TECHNIQUE: Contiguous axial images were obtained from the base of the skull through the vertex without intravenous contrast. COMPARISON:  None. FINDINGS: Brain: No acute intracranial hemorrhage. No focal mass lesion. No CT evidence of acute infarction. No midline shift or mass effect. No hydrocephalus. Basilar cisterns are patent. There are patchy periventricular and subcortical white matter hypodensities. Generalized cortical atrophy. Vascular: No hyperdense vessel or unexpected calcification. Skull: Normal. Negative for fracture or focal lesion. Sinuses/Orbits: Chronic inspissated material filling the RIGHT maxillary sinus. Orbits are normal. Other: None. IMPRESSION: 1. No acute intracranial findings. 2. Chronic patchy subcortical white matter hypodensities. Electronically Signed   By: Suzy Bouchard M.D.   On: 08/22/2019 15:07   US RENAL  Result Date: 08/25/2019 CLINICAL DATA:  74 year old  female with acute renal insufficiency. EXAM: RENAL / URINARY TRACT ULTRASOUND COMPLETE COMPARISON:  None. FINDINGS: Right Kidney: Renal measurements: 8.3 x 4.2 x 2.8 cm = volume: 68 mL . Echogenicity within normal limits. No mass or hydronephrosis visualized. Left Kidney: Renal measurements: 7.8 x 3.5 x 3.1 cm = volume: 44 mL. Echogenicity within normal limits. No mass or hydronephrosis visualized. Bladder: Appears normal for degree of bladder distention. The patient had voided before the exam. There is approximately 95 cc postvoid residual. Other: None. IMPRESSION: 1. Unremarkable kidneys. 2. Small postvoid residual. Electronically Signed   By: Anner Crete M.D.   On: 08/25/2019 22:52   DG CHEST PORT 1 VIEW  Result Date: 08/26/2019 CLINICAL DATA:  Shortness of breath EXAM: PORTABLE CHEST 1 VIEW COMPARISON:  Chest radiograph 08/24/2019 FINDINGS: Monitoring leads overlie the patient. Right anterior chest wall Port-A-Cath is present with tip projecting over the right atrium. Stable cardiomegaly. Aortic atherosclerosis. Bibasilar heterogeneous opacities. No pleural effusion or pneumothorax. Thoracic spine degenerative changes. IMPRESSION: Bibasilar heterogeneous opacities may represent atelectasis. Electronically Signed   By: Lovey Newcomer M.D.   On: 08/26/2019 09:30   DG Chest Port 1 View  Result Date: 08/24/2019 CLINICAL DATA:  Shortness of breath EXAM: PORTABLE CHEST 1 VIEW COMPARISON:  08/22/2019 FINDINGS: Right Port-A-Cath remains in place, unchanged. Heart is borderline in size. Aortic atherosclerosis. No confluent opacities or effusions. No edema. No acute bony abnormality. IMPRESSION: No active disease. Electronically Signed   By: Rolm Baptise M.D.   On: 08/24/2019 20:23   DG Chest Portable 1 View  Result Date: 08/22/2019 CLINICAL DATA:  Patient arrives via EMS from home due to having a suspected overdose. Per ems, the patient was outside with her family when she lost her balance and collapsed on  the ground. On the scene, EMS noted that the patient had pinpoint p.*comment was truncated*Unresponsive EXAM: PORTABLE CHEST 1 VIEW COMPARISON:  08/13/2019 FINDINGS:  Port in the anterior chest wall with tip in distal SVC. Normal cardiac silhouette. Lungs are clear. No effusion, infiltrate or pneumothorax. IMPRESSION: No acute cardiopulmonary process. Electronically Signed   By: Suzy Bouchard M.D.   On: 08/22/2019 15:01   ECHOCARDIOGRAM COMPLETE  Result Date: 08/13/2019    ECHOCARDIOGRAM REPORT   Patient Name:   Allison Vaughan Date of Exam: 08/13/2019 Medical Rec #:  [id number]          Height:       60.0 in Accession #:    [id number]         Weight:       113.0 lb Date of Birth:  Aug 10, 1945          BSA:          1.464 m Patient Age:    65 years           BP:           194/92 mmHg Patient Gender: F                  HR:           76 bpm. Exam Location:  Inpatient Procedure: 2D Echo Indications:    acute diastolic chf 621.30  History:        Patient has prior history of Echocardiogram examinations, most                 recent 07/09/2018. Cardiomyopathy, CAD, COPD; Risk                 Factors:Dyslipidemia.  Sonographer:    Johny Chess Referring Phys: [id number] [place]  1. Left ventricular ejection fraction, by estimation, is 60 to 65%. The left ventricle has normal function. The left ventricle has no regional wall motion abnormalities. Left ventricular diastolic parameters are consistent with Grade II diastolic dysfunction (pseudonormalization). Elevated left ventricular end-diastolic pressure.  2. Right ventricular systolic function is normal. The right ventricular size is normal. There is mildly elevated pulmonary artery systolic pressure.  3. Left atrial size was moderately dilated.  4. The mitral valve is normal in structure. Mild mitral valve regurgitation. No evidence of mitral stenosis.  5. The aortic valve is normal in structure. Aortic valve regurgitation is trivial. No aortic  stenosis is present.  6. The inferior vena cava is normal in size with greater than 50% respiratory variability, suggesting right atrial pressure of 3 mmHg. FINDINGS  Left Ventricle: Left ventricular ejection fraction, by estimation, is 60 to 65%. The left ventricle has normal function. The left ventricle has no regional wall motion abnormalities. The left ventricular internal cavity size was normal in size. There is  no left ventricular hypertrophy. Left ventricular diastolic parameters are consistent with Grade II diastolic dysfunction (pseudonormalization). Elevated left ventricular end-diastolic pressure. Right Ventricle: The right ventricular size is normal. No increase in right ventricular wall thickness. Right ventricular systolic function is normal. There is mildly elevated pulmonary artery systolic pressure. The tricuspid regurgitant velocity is 3.12  m/s, and with an assumed right atrial pressure of 3 mmHg, the estimated right ventricular systolic pressure is 96.2 mmHg. Left Atrium: Left atrial size was moderately dilated. Right Atrium: Right atrial size was normal in size. Pericardium: There is no evidence of pericardial effusion. Mitral Valve: The mitral valve is normal in structure. Normal mobility of the mitral valve leaflets. Mild mitral valve regurgitation. No evidence of mitral valve stenosis. Tricuspid Valve: The tricuspid valve is normal in  structure. Tricuspid valve regurgitation is mild . No evidence of tricuspid stenosis. Aortic Valve: The aortic valve is normal in structure. Aortic valve regurgitation is trivial. No aortic stenosis is present. Pulmonic Valve: The pulmonic valve was normal in structure. Pulmonic valve regurgitation is trivial. No evidence of pulmonic stenosis. Aorta: The aortic root is normal in size and structure. Venous: The inferior vena cava is normal in size with greater than 50% respiratory variability, suggesting right atrial pressure of 3 mmHg. IAS/Shunts: The  interatrial septum appears to be lipomatous. No atrial level shunt detected by color flow Doppler.  LEFT VENTRICLE PLAX 2D LVIDd:         5.10 cm  Diastology LVIDs:         3.60 cm  LV e' lateral:   9.57 cm/s LV PW:         1.00 cm  LV E/e' lateral: 10.8 LV IVS:        0.70 cm  LV e' medial:    7.07 cm/s LVOT diam:     1.85 cm  LV E/e' medial:  14.6 LV SV:         55 LV SV Index:   38 LVOT Area:     2.69 cm  RIGHT VENTRICLE             IVC RV S prime:     14.40 cm/s  IVC diam: 2.00 cm LEFT ATRIUM             Index       RIGHT ATRIUM           Index LA diam:        4.40 cm 3.00 cm/m  RA Area:     15.40 cm LA Vol (A2C):   58.2 ml 39.74 ml/m RA Volume:   40.10 ml  27.38 ml/m LA Vol (A4C):   59.2 ml 40.42 ml/m LA Biplane Vol: 63.3 ml 43.22 ml/m  AORTIC VALVE LVOT Vmax:   86.60 cm/s LVOT Vmean:  54.900 cm/s LVOT VTI:    0.205 m  AORTA Ao Root diam: 2.50 cm Ao Asc diam:  2.80 cm MITRAL VALVE                TRICUSPID VALVE MV Area (PHT): 5.27 cm     TR Peak grad:   38.9 mmHg MV Decel Time: 144 msec     TR Vmax:        312.00 cm/s MV E velocity: 103.00 cm/s MV A velocity: 90.70 cm/s   SHUNTS MV E/A ratio:  1.14         Systemic VTI:  0.20 m                             Systemic Diam: 1.85 cm Fransico Him MD Electronically signed by Fransico Him MD Signature Date/Time: 08/13/2019/2:49:15 PM    Final        IMPRESSION/PLAN: 1. Stage IIIC, cT4N1M0, adenocarcinoma of the body of the pancreas.  Dr. Lisbeth Renshaw discusses the findings and reviews her case.  He discusses that for patients who are unresectable and he cannot tolerate chemotherapy, radiotherapy would be a good option for treatment.  He discusses the technology of stereotactic body radiotherapy.  The patient has already undergone fiducial marker placement at the time of her second biopsy, he anticipates being able to proceed without further imaging, and discusses the risks, benefits, short and long-term effects of radiotherapy.  He  reviews the logistics and  delivery and recommends a course of 5 fractions to the pancreas.  The patient was given a simulation appointment to come in on Tuesday with IV contrast, at which time she will signed written consent to proceed.  This encounter was provided by telemedicine platform Mychart but had to convert to telephone encounter.  The patient has provided two factor identification and has given verbal consent for this type of encounter and has been advised to only accept a meeting of this type in a secure network environment. The time spent during this encounter was 60 minutes including preparation, discussion, and coordination of the patient's care. The attendants for this meeting include Blenda Nicely, RN, Dr. Lisbeth Renshaw, Hayden Pedro  and Nino Parsley and her daughter Clerance Lav.  During the encounter,  Blenda Nicely, RN, Dr. Lisbeth Renshaw, and Hayden Pedro were located at [facility] Radiation Oncology Department.  Allison Vaughan was located with her daughter [name] at home.    The above documentation reflects my direct findings during this shared patient visit. Please see the separate note by Dr. Lisbeth Renshaw on this date for the remainder of the patient's plan of care.    Carola Rhine, PAC

## 2019-09-10 ENCOUNTER — Encounter: Payer: Self-pay | Admitting: General Practice

## 2019-09-10 NOTE — Progress Notes (Signed)
[place] Psychosocial Distress Screening Clinical Social Work  Clinical Social Work was referred by distress screening protocol.  The patient scored a 7 on the Psychosocial Distress Thermometer which indicates moderate distress. Clinical Social Worker contacted patient by phone to assess for distress and other psychosocial needs.  No answer, could not leave VM as mailbox was full.   ONCBCN DISTRESS SCREENING 09/09/2019  Screening Type Initial Screening  Distress experienced in past week (1-10) 7  Emotional problem type Adjusting to illness;Nervousness/Anxiety  Other Contact via phone    Clinical Social Worker follow up needed: Yes.    If yes, follow up plan:  Call again  Beverely Pace, [place], [place] Worker Phone:  [phone]

## 2019-09-14 ENCOUNTER — Ambulatory Visit: Payer: Medicare HMO | Admitting: Radiation Oncology

## 2019-09-14 ENCOUNTER — Encounter: Payer: Self-pay | Admitting: *Deleted

## 2019-09-14 ENCOUNTER — Ambulatory Visit: Payer: Medicare HMO

## 2019-09-14 NOTE — Progress Notes (Signed)
[place] Psychosocial Distress Screening Clinical Social Work  Clinical Social Work was referred by distress screening protocol.  CSW made 2nd attempt to contact patient.  CSW was unable to leave a message due to mailbox being full.  CSW will mail patient information on the [facility] support team and support programs.        ONCBCN DISTRESS SCREENING 09/09/2019  Screening Type Initial Screening  Distress experienced in past week (1-10) 7  Emotional problem type Adjusting to illness;Nervousness/Anxiety  Other Contact via phone     Johnnye Lana, MSW, LCSW, OSW-C Clinical Social Worker [facility] [phone]

## 2019-09-14 NOTE — Progress Notes (Signed)
Cardiology Office Note   Date:  09/15/2019   ID:  Allison Vaughan, DOB 09-15-1945, MRN [id number]  PCP:  Sandi Mariscal, MD  Cardiologist:  Pixie Casino, MD EP: None  Chief Complaint  Patient presents with  . Leg Swelling    bilateral legs and feet   . Hypertension      History of Present Illness: Allison Vaughan is a 74 y.o. female  with a PMH of CAD (noted on CT scans),  chronic combined CHF (EF 40-45% 02/2017, improved to 60-65% 06/2018)  paroxysmal atrial fibrillation, HTN, HLD, COPD, anxiety and depression, opiate abuse, and recent diagnosis of pancreatic adenocarcinoma who presents for the evaluation of LE edema.   She was last evaluated by cardiology at an outpatient visit with myself 01/2019 for preoperative evaluation for upcoming EUS/FNA to further evaluate suspected pancreatic neoplasm. She was deemed acceptable to proceed at moderate risk without further cardiac work-up given concerns that further testing would only delay care. 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 She was started on chemotherapy (gemcitabine and abraxane) and port-a-cath placed 07/2019 with plans to continue chemotherapy for 3-4 months, then start XRT.   In the past month she had had 2 admissions for hypoxic respiratory failure, felt to be multifactorial related to COPD exacerbation, CHF exacerbation, and possible opioid abuse. She had an echo cardiogram 08/13/19 which showed EF 60-65%, G2DD, no RWMA, mildly elevated PA pressures, moderate LAE, and mild MR.Marland Kitchen Her most recent admission from 08/24/19-08/29/19 for acute on chronic respiratory failure was felt to be in part due to medication non-compliance and suspected opioid overuse. Palliative care met with the patient and her daughter and decision made to pursue DNR/DNI status with plans to follow-up with palliative care outpatient.    Ms. Muro daughter called the office 09/01/19 to report increased LE swelling and concerns this was related to amlodipine which was started during her recent admission for management of BP as her losartan was stopped due to AKI. After repeat BMET, decision made to restart losartan and stop amlodipine. She was scheduled for this visit to follow-up on her LE edema.   She presents today with her daughter. She reports LE edema is improving since her initial call 09/01/19. She has been elevating her legs and reports ~10lb weight loss. Still with some LE edema. She has chronic SOB which is unchanged. She reported a brief episode of LUQ/left chest discomfort several nights ago which occurred when laying down to sleep and sounds c/w gas pains, otherwise no chest pain. She denies orthopnea, PND, palpitations, dizziness, lightheadedness, or syncope. We discussed the importance of maintaining a low sodium diet - suspect this is an area for significant improvement - she reports putting salt on apples and all meals.   Past Medical History:  Diagnosis Date  . Accidental drug overdose   . Acute encephalopathy   . Acute on chronic respiratory failure with hypoxia ([place]) 03/01/2017  . Acute renal failure ([place]) 04/16/2014  . AKI (acute kidney injury) ([place]) 04/30/2014  . Altered mental status 06/27/2012  . Anxiety   . Atrial fibrillation ([place])   . CAD (coronary artery disease)   . CAP (community acquired pneumonia) 04/18/2014  . Cardiomyopathy ([place])   . Chronic hypoxemic respiratory failure ([place]) 06/26/2012  . Chronic pain   . COPD exacerbation ([place]) 06/26/2012  . COPD with acute exacerbation ([place]) 03/01/2017  . DEPRESSIVE  DISORDER, NOS 03/13/2006   Qualifier: Diagnosis of  By: Damita Dunnings MD, [place] Heal    . Edema, peripheral   . Essential hypertension 04/30/2014  . Fall at home, initial encounter 03/01/2017  . GASTROESOPHAGEAL REFLUX, NO ESOPHAGITIS 03/13/2006   Qualifier: Diagnosis of  By: Damita Dunnings MD, Phillip Heal    .  HYPERCHOLESTEROLEMIA 03/13/2006   Qualifier: Diagnosis of  By: Damita Dunnings MD, Phillip Heal    . Hyperlipidemia   . HYPERTENSION, BENIGN SYSTEMIC 03/13/2006   Qualifier: Diagnosis of  By: Damita Dunnings MD, Phillip Heal    . Hypokalemia 04/30/2014  . Hyponatremia 04/30/2014  . Hypotension 04/30/2014  . Hypoxemia 06/26/2012  . Hypoxia 03/26/2018  . Irritable bowel syndrome 03/13/2006   Qualifier: Diagnosis of  By: Damita Dunnings MD, Phillip Heal    . Lung nodule 04/16/2014  . MENOPAUSAL SYNDROME 03/13/2006   Qualifier: Diagnosis of  By: Damita Dunnings MD, Phillip Heal    . Mood disorder [facility]) 03/13/2006   Qualifier: Diagnosis of  By: Damita Dunnings MD, Phillip Heal    . 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 Nausea 04/30/2014  . Nausea vomiting and diarrhea 04/16/2014  . OSTEOARTHRITIS, MULTI SITES 03/13/2006   Qualifier: Diagnosis of  By: Damita Dunnings MD, Phillip Heal    . Pulmonary nodule 04/30/2014  . Respiratory failure ([place]) 01/22/2017  . TOBACCO DEPENDENCE 03/13/2006   Qualifier: Diagnosis of  By: Damita Dunnings MD, Phillip Heal    . Weight loss     Past Surgical History:  Procedure Laterality Date  . ABDOMINAL HYSTERECTOMY    . ESOPHAGOGASTRODUODENOSCOPY N/A 07/08/2019   Procedure: ESOPHAGOGASTRODUODENOSCOPY (EGD);  Surgeon: Milus Banister, MD;  Location: [facility];  Service: Endoscopy;  Laterality: N/A;  . ESOPHAGOGASTRODUODENOSCOPY (EGD) WITH PROPOFOL N/A 05/05/2019   Procedure: ESOPHAGOGASTRODUODENOSCOPY (EGD) WITH PROPOFOL;  Surgeon: Arta Silence, MD;  Location: [facility];  Service: Endoscopy;  Laterality: N/A;  . EUS N/A 07/08/2019   Procedure: UPPER ENDOSCOPIC ULTRASOUND (EUS) RADIAL;  Surgeon: Milus Banister, MD;  Location: [facility];  Service: Endoscopy;  Laterality: N/A;  . EYE SURGERY    . FIDUCIAL MARKER PLACEMENT N/A 07/08/2019   Procedure: FIDUCIAL MARKER PLACEMENT;  Surgeon: Milus Banister, MD;  Location: [facility];  Service: Endoscopy;  Laterality: N/A;  . FINE NEEDLE ASPIRATION N/A 05/05/2019   Procedure: FINE NEEDLE ASPIRATION (FNA) LINEAR;   Surgeon: Arta Silence, MD;  Location: [facility];  Service: Endoscopy;  Laterality: N/A;  . FINE NEEDLE ASPIRATION N/A 07/08/2019   Procedure: FINE NEEDLE ASPIRATION (FNA) LINEAR;  Surgeon: Milus Banister, MD;  Location: [facility];  Service: Endoscopy;  Laterality: N/A;  . IR IMAGING GUIDED PORT INSERTION  07/27/2019  . ROTATOR CUFF REPAIR Right 2012  . UPPER ESOPHAGEAL ENDOSCOPIC ULTRASOUND (EUS) N/A 05/05/2019   Procedure: UPPER ESOPHAGEAL ENDOSCOPIC ULTRASOUND (EUS) with FNA;  Surgeon: Arta Silence, MD;  Location: [facility];  Service: Endoscopy;  Laterality: N/A;     Current Outpatient Medications  Medication Sig Dispense Refill  . albuterol (PROVENTIL HFA;VENTOLIN HFA) 108 (90 BASE) MCG/ACT inhaler Inhale 2 puffs into the lungs every 6 (six) hours as needed for wheezing. 1 Inhaler 2  . albuterol (PROVENTIL) (2.5 MG/3ML) 0.083% nebulizer solution Take 3 mLs (2.5 mg total) by nebulization every 2 (two) hours as needed for wheezing. 75 mL 12  . Aspirin-Salicylamide-Caffeine (BC HEADACHE PO) Take 1 packet by mouth 2 (two) times daily as needed (headaches).     Marland Kitchen atorvastatin (LIPITOR) 10 MG tablet Take 1 tablet (10 mg total) by mouth at bedtime. (Patient taking differently: Take 10 mg by mouth  every evening. ) 30 tablet 0  . Calcium Carbonate (CALCIUM 500 PO) Take 500 mg by mouth daily.    . Cholecalciferol (VITAMIN D3) 50 MCG (2000 UT) capsule Take 2,000 Units by mouth daily.    Marland Kitchen FLUoxetine (PROZAC) 40 MG capsule Take 40 mg by mouth every evening.     . hydrALAZINE (APRESOLINE) 50 MG tablet Take 1 tablet (50 mg total) by mouth every 8 (eight) hours. 90 tablet 0  . lidocaine-prilocaine (EMLA) cream Apply to affected area as needed weekly 30 g 3  . losartan (COZAAR) 25 MG tablet Take 1 tablet (25 mg total) by mouth daily. 90 tablet 3  . metoprolol succinate (TOPROL-XL) 25 MG 24 hr tablet TAKE 2 TABLETS EVERY DAY. TAKE WITH OR IMMEDIATELY FOLLOWING A MEAL. 180 tablet 1  .  nitroGLYCERIN (NITROSTAT) 0.4 MG SL tablet Place 1 tablet (0.4 mg total) under the tongue every 5 (five) minutes as needed for chest pain. 90 tablet 3  . omeprazole (PRILOSEC) 40 MG capsule Take 40 mg by mouth every evening.    . ondansetron (ZOFRAN) 8 MG tablet Take 1 tablet (8 mg total) by mouth 2 (two) times daily as needed (Nausea or vomiting). 30 tablet 1  . Oxycodone HCl 10 MG TABS     . potassium chloride (KLOR-CON) 10 MEQ tablet Take 10 mEq by mouth every evening.     . prochlorperazine (COMPAZINE) 10 MG tablet Take 1 tablet (10 mg total) by mouth every 6 (six) hours as needed (Nausea or vomiting). 30 tablet 1  . furosemide (LASIX) 40 MG tablet Take 1 tablet (40 mg total) by mouth daily. 30 tablet 2   No current facility-administered medications for this visit.    Allergies:   Patient has no known allergies.    Social History:  The patient  reports that she has been smoking cigarettes. She has a 75.00 pack-year smoking history. She has never used smokeless tobacco. She reports previous alcohol use. She reports that she does not use drugs.   Family History:  The patient's family history includes CAD in her father; Cancer in her brother, brother, maternal grandmother, and paternal grandmother; Healthy in her daughter and daughter; Lung cancer in her brother.    ROS:  Please see the history of present illness.   Otherwise, review of systems are positive for none.   All other systems are reviewed and negative.    PHYSICAL EXAM: VS:  BP (!) 142/78   Pulse 70   Ht 5' (1.524 m)   Wt 111 lb 9.6 oz (50.6 kg)   SpO2 98%   BMI 21.80 kg/m  , BMI Body mass index is 21.8 kg/m. GEN: Well nourished, well developed, in no acute distress HEENT: sclera anicteric Neck: no JVD, carotid bruits, or masses Cardiac: RRR; no murmurs, rubs, or gallops, no edema  Respiratory:  clear to auscultation bilaterally, normal work of breathing GI: soft, nontender, nondistended, + BS MS: no deformity or  atrophy Skin: warm and dry, no rash Neuro:  Strength and sensation are intact Psych: euthymic mood, full affect   EKG:  EKG is not ordered today.   Recent Labs: 08/24/2019: B Natriuretic Peptide 861.8 08/28/2019: Magnesium 1.9 09/03/2019: ALT 13; BUN 13; Creatinine 1.08; Hemoglobin 9.0; Platelet Count 124; Potassium 3.8; Sodium 137    Lipid Panel    Component Value Date/Time   TRIG 118 01/22/2017 2258      Wt Readings from Last 3 Encounters:  09/15/19 111 lb 9.6 oz (50.6  kg)  09/09/19 120 lb (54.4 kg)  09/03/19 123 lb 4.8 oz (55.9 kg)      Other studies Reviewed: Additional studies/ records that were reviewed today include:   Echocardiogram 08/13/19: 1. Left ventricular ejection fraction, by estimation, is 60 to 65%. The  left ventricle has normal function. The left ventricle has no regional  wall motion abnormalities. Left ventricular diastolic parameters are  consistent with Grade II diastolic  dysfunction (pseudonormalization). Elevated left ventricular end-diastolic  pressure.  2. Right ventricular systolic function is normal. The right ventricular  size is normal. There is mildly elevated pulmonary artery systolic  pressure.  3. Left atrial size was moderately dilated.  4. The mitral valve is normal in structure. Mild mitral valve  regurgitation. No evidence of mitral stenosis.  5. The aortic valve is normal in structure. Aortic valve regurgitation is  trivial. No aortic stenosis is present.  6. The inferior vena cava is normal in size with greater than 50%  respiratory variability, suggesting right atrial pressure of 3 mmHg.     ASSESSMENT AND PLAN:  1. Acute on chronic combined CHF: patient had transient LV systolic dysfunction with EF 40-45% 02/2017 with recovery of EF to 60-65% on echo 06/2018. She has had 2 admissions over the past month for acute on chronic respiratory failure felt to be 2/2 CHF and COPD. Now with LE edema which is attributed to her  brief use of amlodipine which has been improving since discontinuation, though still with 1-2+ edema.  - Will start lasix $RemoveBef'40mg'WDshWWOGLJ$  daily x3 days then as needed going forward.  - Continue metoprolol and losartan - Strongly encouraged a low sodium diet - for started recommended eliminating table salt use.   2. Chest pain in patient with coronary artery calcifications on CT scan: She has occasional exertional chest pain. No prior ischemic evaluation but noted to have coronary artery calcifications on prior CT scans. Also with strong family history of CAD. Risk factors include HTN, HLD, coronary artery calcifications, and tobacco abuse. Ischemic evaluation has been deferred given concerns for delay in care of her pancreatic cancer. Given severity of her disease, favor ongoing medical management of presumed CAD. Not on ASA given need for anticoagulation. - Continue statin - Continue metoprolol - Continue prn SL nitro - Continue risk factor modifications - goal BP <130/80, LDL <70, and HgbA1C <7  3. Paroxysmal atrial fibrillation: she is in NSR today. No complaints of palpitations today. No complaints of bleeding - Continue eliquis for stroke ppx given CHA2DS2-VASc Score of 6 (CHF, HTN, Vascular, Female, and age >/= 48)  - Continue metoprolol for rate control  4. HTN: BP 142/78 today. Recently prescribed amlodipine due to mild AKI which resolved and she was transitioned back to losartan. Possible brief amlodipine use contributed some to her LEE given history of swelling on CCB's in the past.  - Continue hydralazine, metoprolol, and losartan  5. HLD: LDL 68 01/2018 - Continue atorvastatin  6. Pancreatic cancer: followed by Dr. Burr Medico. Currently receiving chemotherapy, though per daughter plan is to pursue XRT instead given intolerance of chemo. Recommended for outpatient palliative care following recent admission to the hospital. - Continue to follow-up with Dr. Selinda Eon care   7. Tobacco  abuse: she smokes 1ppd.  - Continue to encourage smoking cessation.    Current medicines are reviewed at length with the patient today.  The patient does not have concerns regarding medicines.  The following changes have been made:  As above  Labs/ tests  ordered today include:  No orders of the defined types were placed in this encounter.    Disposition:   FU with Dr. Debara Pickett in 3 months  Signed, Abigail Butts, PA-C  09/15/2019 3:51 PM

## 2019-09-15 ENCOUNTER — Ambulatory Visit (INDEPENDENT_AMBULATORY_CARE_PROVIDER_SITE_OTHER): Payer: Medicare HMO | Admitting: Medical

## 2019-09-15 ENCOUNTER — Encounter: Payer: Self-pay | Admitting: Medical

## 2019-09-15 ENCOUNTER — Other Ambulatory Visit: Payer: Self-pay

## 2019-09-15 VITALS — BP 142/78 | HR 70 | Ht 60.0 in | Wt 111.6 lb

## 2019-09-15 DIAGNOSIS — I2510 Atherosclerotic heart disease of native coronary artery without angina pectoris: Secondary | ICD-10-CM

## 2019-09-15 DIAGNOSIS — C251 Malignant neoplasm of body of pancreas: Secondary | ICD-10-CM

## 2019-09-15 DIAGNOSIS — I480 Paroxysmal atrial fibrillation: Secondary | ICD-10-CM | POA: Diagnosis not present

## 2019-09-15 DIAGNOSIS — I10 Essential (primary) hypertension: Secondary | ICD-10-CM

## 2019-09-15 DIAGNOSIS — I5033 Acute on chronic diastolic (congestive) heart failure: Secondary | ICD-10-CM | POA: Diagnosis not present

## 2019-09-15 DIAGNOSIS — E782 Mixed hyperlipidemia: Secondary | ICD-10-CM

## 2019-09-15 MED ORDER — FUROSEMIDE 40 MG PO TABS: 40.0000 mg | ORAL_TABLET | Freq: Every day | ORAL | 2 refills | Status: DC

## 2019-09-15 NOTE — Patient Instructions (Addendum)
Medication Instructions:   Increase Lasix to 40 mg daily for 3 days then take AS NEEDED for swelling or weight gain of 3 lbs overnight or 5 lbs in a week. *If you need a refill on your cardiac medications before your next appointment, please call your pharmacy*  Lab Work: NONE ordered at this time of appointment   If you have labs (blood work) drawn today and your tests are completely normal, you will receive your results only by: Marland Kitchen MyChart Message (if you have MyChart) OR . A paper copy in the mail If you have any lab test that is abnormal or we need to change your treatment, we will call you to review the results.  Testing/Procedures: NONE ordered at this time of appointment   Follow-Up: At [facility], you and your health needs are our priority.  As part of our continuing mission to provide you with exceptional heart care, we have created designated Provider Care Teams.  These Care Teams include your primary Cardiologist (physician) and Advanced Practice Providers (APPs -  Physician Assistants and Nurse Practitioners) who all work together to provide you with the care you need, when you need it.   Your next appointment:   3 month(s)hilty  The format for your next appointment:   In Person  Provider:   K. Mali Hilty, MD  Other Instructions

## 2019-09-17 ENCOUNTER — Ambulatory Visit: Payer: Medicare HMO | Admitting: Radiation Oncology

## 2019-09-17 ENCOUNTER — Ambulatory Visit: Payer: Medicare HMO

## 2019-09-17 ENCOUNTER — Other Ambulatory Visit: Payer: Self-pay

## 2019-09-17 DIAGNOSIS — Z7189 Other specified counseling: Secondary | ICD-10-CM

## 2019-09-17 DIAGNOSIS — C251 Malignant neoplasm of body of pancreas: Secondary | ICD-10-CM | POA: Insufficient documentation

## 2019-09-17 MED ORDER — ONDANSETRON HCL 8 MG PO TABS: 8.0000 mg | ORAL_TABLET | Freq: Two times a day (BID) | ORAL | 1 refills | Status: DC | PRN

## 2019-09-17 NOTE — Progress Notes (Signed)
Patient left me a voice message to cancel her RT appointments for this afternoon, stating "I feel too bad to come".  I messaged RT scheduler and Shona Simpson PA to let her know.

## 2019-09-17 NOTE — Progress Notes (Signed)
Patient's daughter [name] calls for refill on Zofran, I have sent this into [facility] on file.  She states her mother is thinking of cancelling her RT CT simulation this afternoon.  I explained that she has already cancelled once and the longer she puts it off means disease progression.  She states she is going to talk to her about keeping this appointment.

## 2019-09-21 ENCOUNTER — Encounter: Payer: Self-pay | Admitting: Genetic Counselor

## 2019-09-21 DIAGNOSIS — Z1379 Encounter for other screening for genetic and chromosomal anomalies: Secondary | ICD-10-CM | POA: Insufficient documentation

## 2019-09-24 ENCOUNTER — Ambulatory Visit
Admission: RE | Admit: 2019-09-24 | Discharge: 2019-09-24 | Disposition: A | Discharge status: Home / Self Care | Payer: Medicare HMO | Source: Ambulatory Visit | Attending: Radiation Oncology | Admitting: Radiation Oncology

## 2019-09-24 ENCOUNTER — Other Ambulatory Visit: Payer: Self-pay

## 2019-09-24 DIAGNOSIS — C251 Malignant neoplasm of body of pancreas: Secondary | ICD-10-CM | POA: Diagnosis not present

## 2019-09-24 NOTE — Progress Notes (Signed)
Has armband been applied?  Yes  Does patient have an allergy to IV contrast dye?: No   Has patient ever received premedication for IV contrast dye?: n/a  Does patient take metformin?: No  If patient does take metformin when was the last dose: n/a  Date of lab work: 09/03/2019 BUN: 13 CR: 1.08 EGfr: 50  IV site: Right Chest Port  Has IV site been added to flowsheet?  Yes  BP (!) 193/82 (BP Location: Left Arm)   Pulse 71   Temp 98.2 F (36.8 C)   Ht 5' (1.524 m)   Wt 115 lb (52.2 kg)   SpO2 99%   BMI 22.46 kg/m    Wt Readings from Last 3 Encounters:  09/24/19 115 lb (52.2 kg)  09/15/19 111 lb 9.6 oz (50.6 kg)  09/09/19 120 lb (54.4 kg)

## 2019-09-28 ENCOUNTER — Ambulatory Visit: Payer: Medicare HMO | Admitting: Radiation Oncology

## 2019-09-29 ENCOUNTER — Ambulatory Visit: Payer: Medicare HMO | Admitting: Radiation Oncology

## 2019-09-30 ENCOUNTER — Ambulatory Visit: Payer: Medicare HMO | Admitting: Radiation Oncology

## 2019-10-01 ENCOUNTER — Ambulatory Visit: Payer: Medicare HMO | Admitting: Radiation Oncology

## 2019-10-04 ENCOUNTER — Ambulatory Visit: Payer: Medicare HMO | Admitting: Radiation Oncology

## 2019-10-05 ENCOUNTER — Other Ambulatory Visit: Payer: Self-pay | Admitting: Radiation Oncology

## 2019-10-05 ENCOUNTER — Ambulatory Visit: Payer: Medicare HMO | Admitting: Radiation Oncology

## 2019-10-05 DIAGNOSIS — C251 Malignant neoplasm of body of pancreas: Secondary | ICD-10-CM

## 2019-10-06 ENCOUNTER — Telehealth: Payer: Self-pay | Admitting: *Deleted

## 2019-10-06 ENCOUNTER — Ambulatory Visit: Payer: Medicare HMO | Admitting: Radiation Oncology

## 2019-10-06 NOTE — Telephone Encounter (Signed)
CALLED PATIENT TO ASK ABOUT COMING IN FOR STAT LABS ON 10-08-19 @ 11:45 AM, LVM FOR A RETURN CALL

## 2019-10-07 ENCOUNTER — Ambulatory Visit: Payer: Medicare HMO | Admitting: Radiation Oncology

## 2019-10-08 ENCOUNTER — Telehealth: Payer: Self-pay | Admitting: *Deleted

## 2019-10-08 ENCOUNTER — Ambulatory Visit: Payer: Medicare HMO

## 2019-10-08 ENCOUNTER — Ambulatory Visit: Payer: Medicare HMO | Admitting: Radiation Oncology

## 2019-10-08 NOTE — Telephone Encounter (Signed)
Patient called to cancel. She said that she overslept.

## 2019-10-11 ENCOUNTER — Ambulatory Visit: Payer: Medicare HMO | Admitting: Radiation Oncology

## 2019-10-12 ENCOUNTER — Telehealth: Payer: Self-pay | Admitting: *Deleted

## 2019-10-12 NOTE — Telephone Encounter (Signed)
CALLED PATIENT TO INFORM OF STAT LAB ON 10-13-19 @ 12:15 PM @ [place], SPOKE WITH PATIENT'S DAUGHTER- ANDREA STALEY AND SHE IS AWARE OF THIS APPT.

## 2019-10-13 ENCOUNTER — Ambulatory Visit: Payer: Medicare HMO | Admitting: Radiation Oncology

## 2019-10-13 ENCOUNTER — Ambulatory Visit: Payer: Medicare HMO

## 2019-10-13 ENCOUNTER — Telehealth: Payer: Self-pay | Admitting: *Deleted

## 2019-10-13 NOTE — Telephone Encounter (Signed)
CALLED PATIENT TO INFORM OF LAB APPT. ON 10-15-19 @ 9:30 AM, SPOKE WITH PATIIENT'S DAUGHTER- ANDREA AND SHE IS AWARE OF THIS APPT.

## 2019-10-15 ENCOUNTER — Ambulatory Visit
Admission: RE | Admit: 2019-10-15 | Discharge: 2019-10-15 | Disposition: A | Discharge status: Home / Self Care | Payer: Medicare HMO | Source: Ambulatory Visit | Attending: Radiation Oncology | Admitting: Radiation Oncology

## 2019-10-15 ENCOUNTER — Ambulatory Visit: Admission: RE | Admit: 2019-10-15 | Payer: Medicare HMO | Source: Ambulatory Visit | Admitting: Radiation Oncology

## 2019-10-15 ENCOUNTER — Ambulatory Visit: Payer: Medicare HMO | Admitting: Radiation Oncology

## 2019-10-15 ENCOUNTER — Other Ambulatory Visit: Payer: Self-pay

## 2019-10-15 VITALS — BP 138/77 | HR 65 | Temp 98.7°F | Resp 20 | Wt 111.8 lb

## 2019-10-15 DIAGNOSIS — C251 Malignant neoplasm of body of pancreas: Secondary | ICD-10-CM | POA: Diagnosis present

## 2019-10-15 DIAGNOSIS — K8689 Other specified diseases of pancreas: Secondary | ICD-10-CM

## 2019-10-15 LAB — CBC WITH DIFFERENTIAL (CANCER CENTER ONLY)
Abs Immature Granulocytes: 0.01 10*3/uL (ref 0.00–0.07)
Basophils Absolute: 0.1 10*3/uL (ref 0.0–0.1)
Basophils Relative: 1 %
Eosinophils Absolute: 0.3 10*3/uL (ref 0.0–0.5)
Eosinophils Relative: 7 %
HCT: 33.8 % — ABNORMAL LOW (ref 36.0–46.0)
Hemoglobin: 11 g/dL — ABNORMAL LOW (ref 12.0–15.0)
Immature Granulocytes: 0 %
Lymphocytes Relative: 25 %
Lymphs Abs: 1.1 10*3/uL (ref 0.7–4.0)
MCH: 27.9 pg (ref 26.0–34.0)
MCHC: 32.5 g/dL (ref 30.0–36.0)
MCV: 85.8 fL (ref 80.0–100.0)
Monocytes Absolute: 0.5 10*3/uL (ref 0.1–1.0)
Monocytes Relative: 10 %
Neutro Abs: 2.6 10*3/uL (ref 1.7–7.7)
Neutrophils Relative %: 57 %
Platelet Count: 171 10*3/uL (ref 150–400)
RBC: 3.94 MIL/uL (ref 3.87–5.11)
RDW: 14.1 % (ref 11.5–15.5)
WBC Count: 4.5 10*3/uL (ref 4.0–10.5)
nRBC: 0 % (ref 0.0–0.2)

## 2019-10-15 LAB — CMP (CANCER CENTER ONLY)
ALT: <6 U/L (ref 0–44)
AST: 13 U/L — ABNORMAL LOW (ref 15–41)
Albumin: 3.4 g/dL — ABNORMAL LOW (ref 3.5–5.0)
Alkaline Phosphatase: 95 U/L (ref 38–126)
Anion gap: 9 (ref 5–15)
BUN: 11 mg/dL (ref 8–23)
CO2: 22 mmol/L (ref 22–32)
Calcium: 8.6 mg/dL — ABNORMAL LOW (ref 8.9–10.3)
Chloride: 102 mmol/L (ref 98–111)
Creatinine: 1.34 mg/dL — ABNORMAL HIGH (ref 0.44–1.00)
GFR, Est AFR Am: 44 mL/min — ABNORMAL LOW (ref >60)
GFR, Estimated: 38 mL/min — ABNORMAL LOW (ref >60)
Glucose, Bld: 150 mg/dL — ABNORMAL HIGH (ref 70–99)
Potassium: 4 mmol/L (ref 3.5–5.1)
Sodium: 133 mmol/L — ABNORMAL LOW (ref 135–145)
Total Bilirubin: 0.3 mg/dL (ref 0.3–1.2)
Total Protein: 6.1 g/dL — ABNORMAL LOW (ref 6.5–8.1)

## 2019-10-15 MED ORDER — HEPARIN SOD (PORK) LOCK FLUSH 100 UNIT/ML IV SOLN
500.0000 [IU] | Freq: Once | INTRAVENOUS | Status: AC
  Administered 2019-10-15: 500 [IU] via INTRAVENOUS

## 2019-10-15 MED ORDER — SODIUM CHLORIDE 0.9% FLUSH
10.0000 mL | Freq: Once | INTRAVENOUS | Status: AC
  Administered 2019-10-15: 10 mL via INTRAVENOUS

## 2019-10-15 NOTE — Progress Notes (Signed)
Has armband been applied?  Yes  Does patient have an allergy to IV contrast dye?: No   Has patient ever received premedication for IV contrast dye?: n/a  Does patient take metformin?: No  If patient does take metformin when was the last dose: n/a  Date of lab work: 10/15/2019 BUN: 11 CR: 1.34 eGfr: 38  IV site: Right Chest port  Has IV site been added to flowsheet?  Yes  BP 138/77 (BP Location: Right Arm, Patient Position: Sitting, Cuff Size: Normal)   Pulse 65   Temp 98.7 F (37.1 C)   Resp 20   Wt 111 lb 12.8 oz (50.7 kg)   SpO2 95%   BMI 21.83 kg/m

## 2019-10-18 ENCOUNTER — Ambulatory Visit: Payer: Medicare HMO | Admitting: Radiation Oncology

## 2019-10-18 ENCOUNTER — Other Ambulatory Visit: Payer: Medicare HMO

## 2019-10-18 ENCOUNTER — Inpatient Hospital Stay: Payer: Medicare HMO | Admitting: Hematology

## 2019-10-18 ENCOUNTER — Inpatient Hospital Stay: Payer: Medicare HMO

## 2019-10-18 ENCOUNTER — Ambulatory Visit: Payer: Medicare HMO | Admitting: Internal Medicine

## 2019-10-19 ENCOUNTER — Telehealth: Payer: Self-pay | Admitting: Hematology

## 2019-10-19 ENCOUNTER — Ambulatory Visit: Payer: Medicare HMO | Admitting: Radiation Oncology

## 2019-10-19 LAB — CANCER ANTIGEN 19-9: CA 19-9: 609 U/mL — ABNORMAL HIGH (ref 0–35)

## 2019-10-19 NOTE — Telephone Encounter (Signed)
Called pt daughter per 10/4 sch msg - no answer. Left message for patient daughter to call back to reschedule missed appt.

## 2019-10-20 ENCOUNTER — Ambulatory Visit: Payer: Medicare HMO | Admitting: Radiation Oncology

## 2019-10-21 ENCOUNTER — Ambulatory Visit: Payer: Medicare HMO | Admitting: Radiation Oncology

## 2019-10-22 ENCOUNTER — Ambulatory Visit: Payer: Medicare HMO | Admitting: Radiation Oncology

## 2019-10-25 ENCOUNTER — Ambulatory Visit: Payer: Medicare HMO | Admitting: Radiation Oncology

## 2019-10-25 NOTE — Progress Notes (Signed)
Allison Allison   Telephone:(336) [phone] Fax:(336) [phone]   Clinic Follow up Note   Patient Care Team: Allison Real, MD as PCP - General (Internal Medicine) Allison Golden Lisette Abu, MD as PCP - Cardiology (Cardiology) Allison Ricker, RN as Oncology Nurse Navigator Allison Samples, NP as Nurse Practitioner (Nurse Practitioner) Allison Mood, MD as Consulting Physician (Hematology) Allison Allison, Allison Allison., MD as Consulting Physician (Gastroenterology) 10/26/2019  CHIEF COMPLAINT: Follow-up pancreas cancer  SUMMARY OF ONCOLOGIC HISTORY: Oncology History Overview Note  Cancer Staging Primary adenocarcinoma of body of pancreas Surgery Allison Allison) Staging form: Exocrine Pancreas, AJCC 8th Edition - Clinical stage from 07/08/2019: Stage III (cT4, cN1, cM0) - Signed by Allison Mood, MD on 07/13/2019    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HCC)  12/21/2018 Imaging   CT AP w contrast IMPRESSION: 1. Large amount of stool at the level of the rectum. Correlate for symptoms of fecal impaction. 2. Persistent dilatation of the distal pancreatic duct with abrupt cutoff at the level of the pancreatic body. Further evaluation with a nonemergent outpatient contrast enhanced MRI is recommended. 3. Nonobstructive left nephrolithiasis. 4. Subacute anterior right rib fractures.   Aortic Atherosclerosis (ICD10-I70.0).   12/24/2018 Imaging   MRCP IMPRESSION: 1. Suspect a 3.5 cm infiltrating pancreatic body neoplasm causing obstruction of the main pancreatic duct and atrophy of the pancreatic tail. The pancreatic head appears normal. 2. Limited examination due to lack of IV contrast and breathing motion artifact. Difficult to assess the vascular structures for invasion. 3. Recommend endoscopic biopsy. A dedicated high-resolution pancreatic CT scan with contrast may be helpful for further evaluation. 4. No obvious metastatic disease.   12/26/2018 Tumor Marker   CA 19-9: 250    05/05/2019 Procedure    EUS by Dr. Dulce Vaughan An irregular mass was identified in the pancreatic body. The mass was hypoechoic. The mass measured 24 mm by 17 mm in maximal cross-sectional diameter. The endosonographic borders were poorlydefined. The remainder of the pancreas was examined. The endosonographic appearance of parenchyma and the upstream pancreatic duct indicated duct dilation. Appeared to be tissue ingrowth into portions of the dilated pancreatic duct. Fine needle aspiration for cytology was performed.  A few abnormal lymph nodes were visualized in the peripancreatic region. The nodes were oval, hypoechoic and had well defined margins. IMPRESSION - A mass was identified in the pancreatic body. This was staged T3 N1 Mx byendosonographic criteria. Fine needle aspiration performed. 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 - A few abnormal lymph nodes were visualized in the peripancreatic region. - Pancreatic parenchymal abnormalities consisting of edema and stranding were noted in the genu of the pancreas and pancreatic body. - There was no evidence of significant pathology.   05/29/2019 Imaging   CT CAP w contrast IMPRESSION: 1. Interval increase in size of poorly defined, hypoenhancing, infiltrating tumor arising from the body and proximal tail of pancreas. 2. Progressive vascular involvement of the portal vein, portal venous confluence, splenic vein, and celiac artery. 3. No specific features of solid organ metastases or metastatic disease to the chest. 4. Similar appearance of tiny, upper lung zone predominant solid and ground-glass attenuating nodules which may be postinflammatory/infectious in etiology. 5. Aortic atherosclerosis, in addition to 3 vessel coronary artery disease. Please note that although the presence of coronary artery calcium documents the presence of coronary artery disease, the severity of this disease and any potential stenosis cannot be assessed on  this non-gated CT examination. Assessment for potential risk factor modification, dietary  therapy or pharmacologic therapy may be warranted, if clinically indicated.   Aortic Atherosclerosis (ICD10-I70.0).   07/08/2019 Procedure   Repeat EUS for biopsy and fiducial placement by Dr. Ardis Vaughan   1. Irregularly shaped, heterogeneous, hypoechoic mass was noted in the body of the pancreas, measuring 2.7cm across. The mass clearly invades the portal vein and obstructs the main pancreatic duct causing upstream dilation (5-40mm in the body and tail). Preliminary cytology review was positive for malignancy (adenocarcinoma) and then 4 gold fiducials were placed into the mass using the [employer] placement system. 2. CBD was normal, non-dilated. 3. Limited views of the liver, spleen were normal.      07/08/2019 Pathology Results   FINAL MICROSCOPIC DIAGNOSIS:  - Malignant cells consistent with adenocarcinoma    07/08/2019 Cancer Staging   Staging form: Exocrine Pancreas, AJCC 8th Edition - Clinical stage from 07/08/2019: Stage III (cT4, cN1, cM0) - Signed by Allison Merle, MD on 07/13/2019   07/12/2019 Initial Diagnosis   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[place])   07/23/2019 - 08/06/2019 Chemotherapy   First-line Gemcitabine and Abraxane 2 weeks on/1 week off starting 07/23/19 with Gemcitabine alone for C1. Discontinued after 1 cycle due hospitalized twice for dyspnea, CHF and COPD exacerbation. I think her tolerance to chemo is very low.   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  The Common Hereditary Gene Panel offered by [facility] includes sequencing and/or deletion duplication testing of the following 56 genes: APC*, ATM*, AXIN2, BARD1, BMPR1A, BRCA1, BRCA2, BRIP1, CASR, CDH1, CDK4, CDKN2A (p14ARF), CDKN2A (p16INK4a), CFTR*, CHEK2, CPA1, CTNNA1, CTRC,  DICER1*, EPCAM*, FANCC, GREM1*, HOXB13, KIT, MEN1*, MLH1*, MSH2*, MSH3*, MSH6*, MUTYH, NBN, NF1*, NTHL1, PALB2, PALLD, PDGFRA, PMS2*, POLD1*, POLE, PRSS1*, PTEN*, RAD50, RAD51C, RAD51D, RNF43, SDHA*, SDHB, SDHC*, SDHD, SMAD4, SMARCA4, SPINK1, STK11, TP53, TSC1*, TSC2, and VHL.  The report date is September 21, 2019.     CURRENT THERAPY: Pending SBRT  INTERVAL HISTORY: Allison Allison returns for follow-up prior to SBRT.  She is doing fine.  Denies any major changes in her health overall lately. She can do her ADLs at home but otherwise mostly sedentary.  Still has significant back pain that fluctuates, currently 8 out of 10.  Takes oxycodone 10 mg every 4 per PCP.  Her daughter notes 15 mg was too sedating.  Occasional nausea and vomiting is managed with Zofran.  She remains able to eat and drink.  Daughter notes her weight loss since August is related to fluid status.  She is prone to constipation but "all right" for now.  Denies recent fever, chills, cough, chest pain, dyspnea.  Leg edema resolved.   MEDICAL HISTORY:  Past Medical History:  Diagnosis Date  . Accidental drug overdose   . Acute encephalopathy   . Acute on chronic respiratory failure with hypoxia ([place]) 03/01/2017  . Acute renal failure ([place]) 04/16/2014  . AKI (acute kidney injury) ([place]) 04/30/2014  . Altered mental status 06/27/2012  . Anxiety   . Atrial fibrillation ([place])   . CAD (coronary artery disease)   . CAP (community acquired pneumonia) 04/18/2014  . Cardiomyopathy ([place])   . Chronic hypoxemic respiratory failure ([place]) 06/26/2012  . Chronic pain   . COPD exacerbation ([place]) 06/26/2012  . COPD with acute exacerbation ([place]) 03/01/2017  . DEPRESSIVE DISORDER, NOS 03/13/2006   Qualifier: Diagnosis of  By: Damita Dunnings MD,  Phillip Heal    . Edema, peripheral   . Essential hypertension 04/30/2014  . Fall at home, initial encounter 03/01/2017  . GASTROESOPHAGEAL REFLUX, NO ESOPHAGITIS 03/13/2006   Qualifier: Diagnosis of  By: Damita Dunnings MD, Phillip Heal     . HYPERCHOLESTEROLEMIA 03/13/2006   Qualifier: Diagnosis of  By: Damita Dunnings MD, Phillip Heal    . Hyperlipidemia   . HYPERTENSION, BENIGN SYSTEMIC 03/13/2006   Qualifier: Diagnosis of  By: Damita Dunnings MD, Phillip Heal    . Hypokalemia 04/30/2014  . Hyponatremia 04/30/2014  . Hypotension 04/30/2014  . Hypoxemia 06/26/2012  . Hypoxia 03/26/2018  . Irritable bowel syndrome 03/13/2006   Qualifier: Diagnosis of  By: Damita Dunnings MD, Phillip Heal    . Lung nodule 04/16/2014  . MENOPAUSAL SYNDROME 03/13/2006   Qualifier: Diagnosis of  By: Damita Dunnings MD, Phillip Heal    . 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 Nausea 04/30/2014  . Nausea vomiting and diarrhea 04/16/2014  . OSTEOARTHRITIS, MULTI SITES 03/13/2006   Qualifier: Diagnosis of  By: Damita Dunnings MD, Phillip Heal    . Pulmonary nodule 04/30/2014  . Respiratory failure ([place]) 01/22/2017  . TOBACCO DEPENDENCE 03/13/2006   Qualifier: Diagnosis of  By: Damita Dunnings MD, Phillip Heal    . Weight loss     SURGICAL HISTORY: Past Surgical History:  Procedure Laterality Date  . ABDOMINAL HYSTERECTOMY    . ESOPHAGOGASTRODUODENOSCOPY N/A 07/08/2019   Procedure: ESOPHAGOGASTRODUODENOSCOPY (EGD);  Surgeon: Milus Banister, MD;  Location: [facility];  Service: Endoscopy;  Laterality: N/A;  . ESOPHAGOGASTRODUODENOSCOPY (EGD) WITH PROPOFOL N/A 05/05/2019   Procedure: ESOPHAGOGASTRODUODENOSCOPY (EGD) WITH PROPOFOL;  Surgeon: Arta Silence, MD;  Location: [facility];  Service: Endoscopy;  Laterality: N/A;  . EUS N/A 07/08/2019   Procedure: UPPER ENDOSCOPIC ULTRASOUND (EUS) RADIAL;  Surgeon: Milus Banister, MD;  Location: [facility];  Service: Endoscopy;  Laterality: N/A;  . EYE SURGERY    . FIDUCIAL MARKER PLACEMENT N/A 07/08/2019   Procedure: FIDUCIAL MARKER PLACEMENT;  Surgeon: Milus Banister, MD;  Location: [facility];  Service: Endoscopy;  Laterality: N/A;  . FINE NEEDLE ASPIRATION N/A 05/05/2019   Procedure: FINE NEEDLE  ASPIRATION (FNA) LINEAR;  Surgeon: Arta Silence, MD;  Location: [facility];  Service: Endoscopy;  Laterality: N/A;  . FINE NEEDLE ASPIRATION N/A 07/08/2019   Procedure: FINE NEEDLE ASPIRATION (FNA) LINEAR;  Surgeon: Milus Banister, MD;  Location: [facility];  Service: Endoscopy;  Laterality: N/A;  . IR IMAGING GUIDED PORT INSERTION  07/27/2019  . ROTATOR CUFF REPAIR Right 2012  . UPPER ESOPHAGEAL ENDOSCOPIC ULTRASOUND (EUS) N/A 05/05/2019   Procedure: UPPER ESOPHAGEAL ENDOSCOPIC ULTRASOUND (EUS) with FNA;  Surgeon: Arta Silence, MD;  Location: [facility];  Service: Endoscopy;  Laterality: N/A;    I have reviewed the social history and family history with the patient and they are unchanged from previous note.  ALLERGIES:  has No Known Allergies.  MEDICATIONS:  Current Outpatient Medications  Medication Sig Dispense Refill  . albuterol (PROVENTIL HFA;VENTOLIN HFA) 108 (90 BASE) MCG/ACT inhaler Inhale 2 puffs into the lungs every 6 (six) hours as needed for wheezing. 1 Inhaler 2  . albuterol (PROVENTIL) (2.5 MG/3ML) 0.083% nebulizer solution Take 3 mLs (2.5 mg total) by nebulization every 2 (two) hours as needed for wheezing. 75 mL 12  . Aspirin-Salicylamide-Caffeine (BC HEADACHE PO) Take 1 packet by mouth 2 (two) times daily as needed (headaches).     Marland Kitchen atorvastatin (LIPITOR) 10 MG tablet  Take 1 tablet (10 mg total) by mouth at bedtime. (Patient taking differently: Take 10 mg by mouth every evening. ) 30 tablet 0  . Calcium Carbonate (CALCIUM 500 PO) Take 500 mg by mouth daily.    . Cholecalciferol (VITAMIN D3) 50 MCG (2000 UT) capsule Take 2,000 Units by mouth daily.    Marland Kitchen FLUoxetine (PROZAC) 40 MG capsule Take 40 mg by mouth every evening.     . furosemide (LASIX) 40 MG tablet Take 1 tablet (40 mg total) by mouth daily. 30 tablet 2  . lidocaine-prilocaine (EMLA) cream Apply to affected area as needed weekly 30 g 3  . losartan (COZAAR) 25 MG tablet Take 1 tablet (25 mg total) by  mouth daily. 90 tablet 3  . metoprolol succinate (TOPROL-XL) 25 MG 24 hr tablet TAKE 2 TABLETS EVERY DAY. TAKE WITH OR IMMEDIATELY FOLLOWING A MEAL. 180 tablet 1  . nitroGLYCERIN (NITROSTAT) 0.4 MG SL tablet Place 1 tablet (0.4 mg total) under the tongue every 5 (five) minutes as needed for chest pain. 90 tablet 3  . omeprazole (PRILOSEC) 40 MG capsule Take 40 mg by mouth every evening.    . ondansetron (ZOFRAN) 8 MG tablet Take 1 tablet (8 mg total) by mouth 2 (two) times daily as needed (Nausea or vomiting). 30 tablet 1  . Oxycodone HCl 10 MG TABS     . potassium chloride (KLOR-CON) 10 MEQ tablet Take 10 mEq by mouth every evening.     . prochlorperazine (COMPAZINE) 10 MG tablet Take 1 tablet (10 mg total) by mouth every 6 (six) hours as needed (Nausea or vomiting). 30 tablet 1  . hydrALAZINE (APRESOLINE) 50 MG tablet Take 1 tablet (50 mg total) by mouth every 8 (eight) hours. 90 tablet 0   No current facility-administered medications for this visit.    PHYSICAL EXAMINATION: ECOG PERFORMANCE STATUS: 1 - Symptomatic but completely ambulatory  Vitals:   10/26/19 1218  BP: (!) 150/64  Pulse: 66  Resp: 18  Temp: (!) 96.3 F (35.7 C)  SpO2: 97%   Filed Weights   10/26/19 1218  Weight: 110 lb 9.6 oz (50.2 kg)    GENERAL:alert, no distress and comfortable SKIN: No rash to exposed skin EYES: sclera clear LUNGS: with normal breathing effort HEART:  no lower extremity edema NEURO: alert & oriented x 3 with fluent speech  LABORATORY DATA:  I have reviewed the data as listed CBC Latest Ref Rng & Units 10/15/2019 09/03/2019 08/28/2019  WBC 4.0 - 10.5 K/uL 4.5 3.7(L) 5.0  Hemoglobin 12.0 - 15.0 g/dL 11.0(L) 9.0(L) 9.3(L)  Hematocrit 36 - 46 % 33.8(L) 27.8(L) 29.6(L)  Platelets 150 - 400 K/uL 171 124(L) 157     CMP Latest Ref Rng & Units 10/15/2019 09/03/2019 08/29/2019  Glucose 70 - 99 mg/dL 150(H) 91 87  BUN 8 - 23 mg/dL 11 13 26(H)  Creatinine 0.44 - 1.00 mg/dL 1.34(H) 1.08(H)  1.40(H)  Sodium 135 - 145 mmol/L 133(L) 137 140  Potassium 3.5 - 5.1 mmol/L 4.0 3.8 4.5  Chloride 98 - 111 mmol/L 102 103 103  CO2 22 - 32 mmol/L $RemoveB'22 24 29  'JDHaBmbx$ Calcium 8.9 - 10.3 mg/dL 8.6(L) 8.7(L) 8.5(L)  Total Protein 6.5 - 8.1 g/dL 6.1(L) 5.9(L) -  Total Bilirubin 0.3 - 1.2 mg/dL 0.3 0.3 -  Alkaline Phos 38 - 126 U/L 95 102 -  AST 15 - 41 U/L 13(L) 14(L) -  ALT 0 - 44 U/L <6 13 -      RADIOGRAPHIC  STUDIES: I have personally reviewed the radiological images as listed and agreed with the findings in the report. No results found.   ASSESSMENT & PLAN: 74 yo female with   1. Pancreatic neoplasm, GQ6P6P9 -she presented with gradual weight loss and abdominal discomfort with low back pain for 6-12 months, CA 19-9 is elevated to 250. 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  Due to vascular invasion she is not a surgical candidate, therefore her cancer is no longer curable but still treatable -Dr. Burr Medico started her on first-line systemic chemotherapy with gemcitabine/Abraxane on 07/23/2019, despite dose reduction on day 8 she tolerated poorly, required hospitalization, chemotherapy was ultimately discontinued after 1 cycle. -Pending the start of SBRT today x5 treatments, the plan is to proceed with observation after RT  2. Abdominal bloating, weight loss, low back pain -her abdominal bloating/discomfort and weight loss are c/w malignancy, has been gradual over 6-12 months. - She weighed 150-160 lbs about 1.5 years ago. She attributes some of her weight loss to grieving her son's death this year. She has been able to gain some weight lately.  -her low back pain appears chronic, likely not entirely related to pancreatic mass -she is on long term oxycodone 10 mg q6h,  managed by her PCP Dr. Nancy Fetter -pain and weight are stable overall -she has worsening abdominal pain after EUS biopsy and fiducial placement. She went to ED on 6/16. CT AP was stable, no acute process.  -stable, continue monitoring   3. COPD -controlled, not oxygen dependent  -on albuterol PRN  -She has 50 year history of smoking 1-2PPD, I strongly encouraged her to quit smoking, especially as she is being considered for surgical resection. She is willing to try to quit  -missed recent appointment with pulmonology  -continues to smoke, counseled on cessation  4. Anxiety and depression -Chronic, controlled; managed by PCP  -Vaughan is stable on fluoxetine and ativan at bedtime   5. Afib, CAD, HTN -followed by PCP; on apixaban and atorvastatin -in past few months stopped losartan and metoprolol due to low BP, dizziness, and disorientation  6. Genetics   7. Goals of care -she understands because her cancer is not operable it is likely not curable. Treatment goal is palliative.    Disposition: Allison Allison appears stable.  There is no clinical evidence of disease progression.  She will start SBRT today through 10/22 x 5 treatments.  The plan is to observe her after RT. We will see her back 4 weeks after completing SBRT with restaging CT few days prior. She is agreeable to let palliative care check on her at home between now and her next f/u with [place].   Recent labs stable. CA 19-9 fluctuates, will monitor after RT and in the future. Questions were answered. She knows to call back sooner if she develops more fatigue, dehydration, juandice, worsening pain, uncontrolled n/v/c/d, fever, dyspnea, or other concerns.   CT CAP 4 weeks after RT, then f/u few days later (6 weeks from now).   Orders Placed This Encounter  Procedures  . CT CHEST ABDOMEN PELVIS W CONTRAST    Standing Status:   Future    Standing Expiration Date:   10/25/2020    Order Specific Question:   If indicated for the  ordered procedure, I authorize the administration of contrast media per Radiology protocol  Answer:   Yes    Order Specific Question:   Preferred imaging location?    Answer:   [facility]    Order Specific Question:   Is Oral Contrast requested for this exam?    Answer:   Yes, Per Radiology protocol    Order Specific Question:   Reason for Exam (SYMPTOM  OR DIAGNOSIS REQUIRED)    Answer:   Pancreas cancer, restaging, evaluate response to SBRT   All questions were answered. The patient knows to call the clinic with any problems, questions or concerns. No barriers to learning was detected.    Alla Feeling, NP 10/26/19

## 2019-10-26 ENCOUNTER — Inpatient Hospital Stay (HOSPITAL_BASED_OUTPATIENT_CLINIC_OR_DEPARTMENT_OTHER): Payer: Medicare HMO | Admitting: Nurse Practitioner

## 2019-10-26 ENCOUNTER — Inpatient Hospital Stay: Payer: Medicare HMO | Attending: Nurse Practitioner

## 2019-10-26 ENCOUNTER — Other Ambulatory Visit: Payer: Self-pay

## 2019-10-26 ENCOUNTER — Ambulatory Visit: Payer: Medicare HMO | Admitting: Radiation Oncology

## 2019-10-26 ENCOUNTER — Encounter: Payer: Self-pay | Admitting: Nurse Practitioner

## 2019-10-26 ENCOUNTER — Ambulatory Visit
Admission: RE | Admit: 2019-10-26 | Discharge: 2019-10-26 | Disposition: A | Discharge status: Home / Self Care | Payer: Medicare HMO | Source: Ambulatory Visit | Attending: Radiation Oncology | Admitting: Radiation Oncology

## 2019-10-26 VITALS — BP 150/64 | HR 66 | Temp 96.3°F | Resp 18 | Ht 60.0 in | Wt 110.6 lb

## 2019-10-26 DIAGNOSIS — I110 Hypertensive heart disease with heart failure: Secondary | ICD-10-CM | POA: Diagnosis not present

## 2019-10-26 DIAGNOSIS — I509 Heart failure, unspecified: Secondary | ICD-10-CM | POA: Diagnosis not present

## 2019-10-26 DIAGNOSIS — E785 Hyperlipidemia, unspecified: Secondary | ICD-10-CM | POA: Insufficient documentation

## 2019-10-26 DIAGNOSIS — Z95828 Presence of other vascular implants and grafts: Secondary | ICD-10-CM

## 2019-10-26 DIAGNOSIS — I4891 Unspecified atrial fibrillation: Secondary | ICD-10-CM | POA: Diagnosis not present

## 2019-10-26 DIAGNOSIS — F419 Anxiety disorder, unspecified: Secondary | ICD-10-CM | POA: Diagnosis not present

## 2019-10-26 DIAGNOSIS — Z452 Encounter for adjustment and management of vascular access device: Secondary | ICD-10-CM | POA: Diagnosis present

## 2019-10-26 DIAGNOSIS — C251 Malignant neoplasm of body of pancreas: Secondary | ICD-10-CM | POA: Diagnosis not present

## 2019-10-26 DIAGNOSIS — E7800 Pure hypercholesterolemia, unspecified: Secondary | ICD-10-CM | POA: Diagnosis not present

## 2019-10-26 DIAGNOSIS — F329 Major depressive disorder, single episode, unspecified: Secondary | ICD-10-CM | POA: Insufficient documentation

## 2019-10-26 DIAGNOSIS — I2510 Atherosclerotic heart disease of native coronary artery without angina pectoris: Secondary | ICD-10-CM | POA: Diagnosis not present

## 2019-10-26 DIAGNOSIS — Z7982 Long term (current) use of aspirin: Secondary | ICD-10-CM | POA: Diagnosis not present

## 2019-10-26 DIAGNOSIS — Z79899 Other long term (current) drug therapy: Secondary | ICD-10-CM | POA: Diagnosis not present

## 2019-10-26 MED ORDER — HEPARIN SOD (PORK) LOCK FLUSH 100 UNIT/ML IV SOLN
500.0000 [IU] | Freq: Once | INTRAVENOUS | Status: AC
  Administered 2019-10-26: 500 [IU]
  Filled 2019-10-26: qty 5

## 2019-10-26 MED ORDER — SODIUM CHLORIDE 0.9% FLUSH
10.0000 mL | Freq: Once | INTRAVENOUS | Status: AC
  Administered 2019-10-26: 10 mL
  Filled 2019-10-26: qty 10

## 2019-10-26 NOTE — Patient Instructions (Signed)

## 2019-10-27 ENCOUNTER — Telehealth: Payer: Self-pay | Admitting: Nurse Practitioner

## 2019-10-27 ENCOUNTER — Ambulatory Visit: Payer: Medicare HMO | Admitting: Radiation Oncology

## 2019-10-27 NOTE — Telephone Encounter (Signed)
Scheduled per 10/12 los. Unable to reach pt. Voicemail is full. Mailing pt appt calendar.

## 2019-10-28 ENCOUNTER — Ambulatory Visit
Admission: RE | Admit: 2019-10-28 | Discharge: 2019-10-28 | Disposition: A | Discharge status: Home / Self Care | Payer: Medicare HMO | Source: Ambulatory Visit | Attending: Radiation Oncology | Admitting: Radiation Oncology

## 2019-10-28 ENCOUNTER — Ambulatory Visit: Payer: Medicare HMO | Admitting: Radiation Oncology

## 2019-10-28 DIAGNOSIS — C251 Malignant neoplasm of body of pancreas: Secondary | ICD-10-CM | POA: Diagnosis not present

## 2019-10-29 ENCOUNTER — Ambulatory Visit: Payer: Medicare HMO | Admitting: Radiation Oncology

## 2019-11-01 ENCOUNTER — Ambulatory Visit
Admission: RE | Admit: 2019-11-01 | Discharge: 2019-11-01 | Disposition: A | Discharge status: Home / Self Care | Payer: Medicare HMO | Source: Ambulatory Visit | Attending: Radiation Oncology | Admitting: Radiation Oncology

## 2019-11-01 DIAGNOSIS — C251 Malignant neoplasm of body of pancreas: Secondary | ICD-10-CM | POA: Diagnosis not present

## 2019-11-03 ENCOUNTER — Ambulatory Visit
Admission: RE | Admit: 2019-11-03 | Discharge: 2019-11-03 | Disposition: A | Discharge status: Home / Self Care | Payer: Medicare HMO | Source: Ambulatory Visit | Attending: Radiation Oncology | Admitting: Radiation Oncology

## 2019-11-03 ENCOUNTER — Other Ambulatory Visit: Payer: Self-pay

## 2019-11-03 DIAGNOSIS — C251 Malignant neoplasm of body of pancreas: Secondary | ICD-10-CM | POA: Diagnosis not present

## 2019-11-05 ENCOUNTER — Encounter: Payer: Self-pay | Admitting: Radiation Oncology

## 2019-11-05 ENCOUNTER — Ambulatory Visit
Admission: RE | Admit: 2019-11-05 | Discharge: 2019-11-05 | Disposition: A | Discharge status: Home / Self Care | Payer: Medicare HMO | Source: Ambulatory Visit | Attending: Radiation Oncology | Admitting: Radiation Oncology

## 2019-11-05 DIAGNOSIS — C251 Malignant neoplasm of body of pancreas: Secondary | ICD-10-CM | POA: Diagnosis not present

## 2019-11-26 ENCOUNTER — Telehealth: Payer: Self-pay | Admitting: Internal Medicine

## 2019-11-26 NOTE — Telephone Encounter (Signed)
Spoke with patient regarding time change for appointment with Dr. Debara Pickett on 12/16/19--new time is 3:15 pm patient voiced her understanding.

## 2019-12-03 ENCOUNTER — Encounter: Payer: Self-pay | Admitting: Hematology

## 2019-12-03 NOTE — Progress Notes (Signed)
Patient's daughter called to inquire about grant. Advised what is needed to apply.  She will send with patient on 11/29 to complete grant application.  They have my card for any additional financial questions or concerns.

## 2019-12-11 ENCOUNTER — Telehealth: Payer: Self-pay | Admitting: Radiation Oncology

## 2019-12-11 NOTE — Telephone Encounter (Signed)
  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  Narrative:  The patient was contacted today for routine follow-up. During treatment she did very well with radiotherapy and did not have significant desquamation.    Impression/Plan: 1. Stage IIIC, cT4N1M0, adenocarcinoma of the body of the pancreas. I was unable to reach the patient today as her voicemail was full. We would be happy to continue to follow her as needed, but she will also continue to follow up with Dr. Burr Medico in medical oncology whom she saw today.    Carola Rhine, PAC

## 2019-12-13 ENCOUNTER — Telehealth: Payer: Self-pay | Admitting: Hematology

## 2019-12-13 ENCOUNTER — Inpatient Hospital Stay: Payer: Medicare HMO

## 2019-12-13 ENCOUNTER — Inpatient Hospital Stay: Payer: Medicare HMO | Admitting: Hematology

## 2019-12-13 DIAGNOSIS — I10 Essential (primary) hypertension: Secondary | ICD-10-CM

## 2019-12-13 DIAGNOSIS — J42 Unspecified chronic bronchitis: Secondary | ICD-10-CM

## 2019-12-13 DIAGNOSIS — C251 Malignant neoplasm of body of pancreas: Secondary | ICD-10-CM

## 2019-12-13 NOTE — Telephone Encounter (Signed)
Called pt per 11/29 sch msg to reschedule appt - no answer. Left message for patient to call back to reschedule appt.

## 2019-12-16 ENCOUNTER — Ambulatory Visit: Payer: Medicare HMO | Admitting: Internal Medicine

## 2019-12-21 ENCOUNTER — Telehealth: Payer: Self-pay

## 2019-12-21 NOTE — Telephone Encounter (Signed)
I left Allison Vaughan a message to return my call.  I need to review Lab and CT appt on 12/13 and f/u with [name].

## 2019-12-24 ENCOUNTER — Telehealth: Payer: Self-pay

## 2019-12-24 NOTE — Telephone Encounter (Signed)
I left another message for Allison Vaughan to call.  I am unable to leave a message on [name]'s phone as the mailbox is full

## 2019-12-26 NOTE — Progress Notes (Signed)
  Radiation Oncology         [phone] ________________________________  Name: Allison Vaughan MRN: [id number]  Date: 11/05/2019  DOB: Sep 12, 1945  End of Treatment Note  Diagnosis:   pancreatic cancer     Indication for treatment::  curative       Radiation treatment dates:   10/26/19 - 11/05/19  Site/dose:   the abdomen was treated to a dose of 33 Gy in 5 fractions, corresponding to a course of SBRT.  Narrative: The patient tolerated radiation treatment relatively well.   No major issues of nausea, diarrhea.  Plan: The patient has completed radiation treatment. The patient will return to radiation oncology clinic for routine followup in one month. I advised the patient to call or return sooner if they have any questions or concerns related to their recovery or treatment. ________________________________  Jodelle Gross, M.D., Ph.D.

## 2019-12-27 ENCOUNTER — Ambulatory Visit (HOSPITAL_COMMUNITY): Payer: Medicare HMO

## 2019-12-27 ENCOUNTER — Inpatient Hospital Stay: Payer: Medicare HMO

## 2019-12-28 ENCOUNTER — Ambulatory Visit (HOSPITAL_COMMUNITY): Payer: Medicare HMO

## 2019-12-29 ENCOUNTER — Inpatient Hospital Stay: Payer: Medicare HMO | Admitting: Nurse Practitioner

## 2020-01-05 ENCOUNTER — Other Ambulatory Visit: Payer: Self-pay | Admitting: Hematology

## 2020-01-05 DIAGNOSIS — C251 Malignant neoplasm of body of pancreas: Secondary | ICD-10-CM

## 2020-01-05 DIAGNOSIS — Z7189 Other specified counseling: Secondary | ICD-10-CM

## 2020-01-06 ENCOUNTER — Inpatient Hospital Stay: Payer: Medicare HMO | Attending: Nurse Practitioner

## 2020-01-06 ENCOUNTER — Ambulatory Visit (HOSPITAL_COMMUNITY)
Admission: RE | Admit: 2020-01-06 | Discharge: 2020-01-06 | Disposition: A | Discharge status: Home / Self Care | Payer: Medicare HMO | Source: Ambulatory Visit | Attending: Nurse Practitioner | Admitting: Nurse Practitioner

## 2020-01-06 ENCOUNTER — Encounter: Payer: Self-pay | Admitting: Hematology

## 2020-01-06 ENCOUNTER — Other Ambulatory Visit: Payer: Self-pay

## 2020-01-06 DIAGNOSIS — C251 Malignant neoplasm of body of pancreas: Secondary | ICD-10-CM | POA: Diagnosis not present

## 2020-01-06 DIAGNOSIS — K8689 Other specified diseases of pancreas: Secondary | ICD-10-CM

## 2020-01-06 LAB — CBC WITH DIFFERENTIAL (CANCER CENTER ONLY)
Abs Immature Granulocytes: 0.02 10*3/uL (ref 0.00–0.07)
Basophils Absolute: 0 10*3/uL (ref 0.0–0.1)
Basophils Relative: 1 %
Eosinophils Absolute: 0.2 10*3/uL (ref 0.0–0.5)
Eosinophils Relative: 5 %
HCT: 36.6 % (ref 36.0–46.0)
Hemoglobin: 12.5 g/dL (ref 12.0–15.0)
Immature Granulocytes: 0 %
Lymphocytes Relative: 11 %
Lymphs Abs: 0.5 10*3/uL — ABNORMAL LOW (ref 0.7–4.0)
MCH: 28.5 pg (ref 26.0–34.0)
MCHC: 34.2 g/dL (ref 30.0–36.0)
MCV: 83.6 fL (ref 80.0–100.0)
Monocytes Absolute: 0.5 10*3/uL (ref 0.1–1.0)
Monocytes Relative: 12 %
Neutro Abs: 3.2 10*3/uL (ref 1.7–7.7)
Neutrophils Relative %: 71 %
Platelet Count: 135 10*3/uL — ABNORMAL LOW (ref 150–400)
RBC: 4.38 MIL/uL (ref 3.87–5.11)
RDW: 14.6 % (ref 11.5–15.5)
WBC Count: 4.5 10*3/uL (ref 4.0–10.5)
nRBC: 0 % (ref 0.0–0.2)

## 2020-01-06 LAB — CMP (CANCER CENTER ONLY)
ALT: 7 U/L (ref 0–44)
AST: 15 U/L (ref 15–41)
Albumin: 3.8 g/dL (ref 3.5–5.0)
Alkaline Phosphatase: 79 U/L (ref 38–126)
Anion gap: 12 (ref 5–15)
BUN: 10 mg/dL (ref 8–23)
CO2: 29 mmol/L (ref 22–32)
Calcium: 9.6 mg/dL (ref 8.9–10.3)
Chloride: 95 mmol/L — ABNORMAL LOW (ref 98–111)
Creatinine: 1.26 mg/dL — ABNORMAL HIGH (ref 0.44–1.00)
GFR, Estimated: 44 mL/min — ABNORMAL LOW (ref >60)
Glucose, Bld: 118 mg/dL — ABNORMAL HIGH (ref 70–99)
Potassium: 3.4 mmol/L — ABNORMAL LOW (ref 3.5–5.1)
Sodium: 136 mmol/L (ref 135–145)
Total Bilirubin: 0.4 mg/dL (ref 0.3–1.2)
Total Protein: 7 g/dL (ref 6.5–8.1)

## 2020-01-06 MED ORDER — IOHEXOL 300 MG/ML  SOLN
75.0000 mL | Freq: Once | INTRAMUSCULAR | Status: AC | PRN
  Administered 2020-01-06: 75 mL via INTRAVENOUS

## 2020-01-06 NOTE — Progress Notes (Signed)
Met with patient/accompanying adult to obtain signature for [employer].  Patient approved for one-time $1000 [employer] grant to assist with personal expenses while going through treatment. Went over in detail expenses and how they are covered.  Gave her a copy of the approval letter and expense sheet along with the Outpatient pharmacy information. She received a gift card today from the grant.  She has my card for any additional financial questions or concerns.

## 2020-01-07 LAB — CANCER ANTIGEN 19-9: CA 19-9: 238 U/mL — ABNORMAL HIGH (ref 0–35)

## 2020-01-10 ENCOUNTER — Telehealth: Payer: Self-pay | Admitting: Licensed Clinical Social Worker

## 2020-01-10 NOTE — Telephone Encounter (Signed)
[place] Work  Clinical Social Work was referred by [facility] per patient request.  Clinical Social Worker attempted to contact patient/ daughter by phone  to offer support and assess for needs.  No answer.   Left VM with contact information. Will also notify CSWs [place] and request follow-up with patient if no return call received this week.      [place], [place] Worker [employer]

## 2020-01-17 ENCOUNTER — Telehealth: Payer: Self-pay

## 2020-01-17 NOTE — Telephone Encounter (Signed)
Called left message for pt to return call to cancer center to receive lab results per Np provider will review in greater detail at next visit

## 2020-01-17 NOTE — Progress Notes (Incomplete)
[place]   [phone] Fax:([phone]) [phone]   Clinic Follow up Note   Patient Care Team: Sandi Mariscal, MD as PCP - General (Internal Medicine) Debara Pickett Nadean Corwin, MD as PCP - Cardiology (Cardiology) Jonnie Finner, RN as Oncology Nurse Navigator Alla Feeling, NP as Nurse Practitioner (Nurse Practitioner) Truitt Merle, MD as Consulting Physician (Hematology) Mansouraty, Telford Nab., MD as Consulting Physician (Gastroenterology)  Date of Service:  01/17/2020  CHIEF COMPLAINT: F/u pancreaticcancer   SUMMARY OF ONCOLOGIC HISTORY: Oncology History Overview Note  Cancer Staging Primary adenocarcinoma of body of pancreas [facility]) Staging form: Exocrine Pancreas, AJCC 8th Edition - Clinical stage from 07/08/2019: Stage III (cT4, cN1, cM0) - Signed by Truitt Merle, MD on 07/13/2019    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[place])  12/21/2018 Imaging   CT AP w contrast IMPRESSION: 1. Large amount of stool at the level of the rectum. Correlate for symptoms of fecal impaction. 2. Persistent dilatation of the distal pancreatic duct with abrupt cutoff at the level of the pancreatic body. Further evaluation with a nonemergent outpatient contrast enhanced MRI is recommended. 3. Nonobstructive left nephrolithiasis. 4. Subacute anterior right rib fractures.   Aortic Atherosclerosis (ICD10-I70.0).   12/24/2018 Imaging   MRCP IMPRESSION: 1. Suspect a 3.5 cm infiltrating pancreatic body neoplasm causing obstruction of the main pancreatic duct and atrophy of the pancreatic tail. The pancreatic head appears normal. 2. Limited examination due to lack of IV contrast and breathing motion artifact. Difficult to assess the vascular structures for invasion. 3. Recommend endoscopic biopsy. A dedicated high-resolution pancreatic CT scan with contrast may be helpful for further evaluation. 4. No obvious metastatic disease.   12/26/2018 Tumor Marker   CA 19-9: 250     05/05/2019 Procedure   EUS by Dr. Paulita Fujita An irregular mass was identified in the pancreatic body. The mass was hypoechoic. The mass measured 24 mm by 17 mm in maximal cross-sectional diameter. The endosonographic borders were poorlydefined. The remainder of the pancreas was examined. The endosonographic appearance of parenchyma and the upstream pancreatic duct indicated duct dilation. Appeared to be tissue ingrowth into portions of the dilated pancreatic duct. Fine needle aspiration for cytology was performed.  A few abnormal lymph nodes were visualized in the peripancreatic region. The nodes were oval, hypoechoic and had well defined margins. IMPRESSION - A mass was identified in the pancreatic body. This was staged T3 N1 Mx byendosonographic criteria. Fine needle aspiration performed. 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 - A few abnormal lymph nodes were visualized in the peripancreatic region. - Pancreatic parenchymal abnormalities consisting of edema and stranding were noted in the genu of the pancreas and pancreatic body. - There was no evidence of significant pathology.   05/29/2019 Imaging   CT CAP w contrast IMPRESSION: 1. Interval increase in size of poorly defined, hypoenhancing, infiltrating tumor arising from the body and proximal tail of pancreas. 2. Progressive vascular involvement of the portal vein, portal venous confluence, splenic vein, and celiac artery. 3. No specific features of solid organ metastases or metastatic disease to the chest. 4. Similar appearance of tiny, upper lung zone predominant solid and ground-glass attenuating nodules which may be postinflammatory/infectious in etiology. 5. Aortic atherosclerosis, in addition to 3 vessel coronary artery disease. Please note that although the presence of coronary artery calcium documents the presence of coronary artery disease, the severity of this disease and any potential  stenosis cannot be assessed on this non-gated CT examination. Assessment for  potential risk factor modification, dietary therapy or pharmacologic therapy may be warranted, if clinically indicated.   Aortic Atherosclerosis (ICD10-I70.0).   07/08/2019 Procedure   Repeat EUS for biopsy and fiducial placement by Dr. Ardis Hughs   1. Irregularly shaped, heterogeneous, hypoechoic mass was noted in the body of the pancreas, measuring 2.7cm across. The mass clearly invades the portal vein and obstructs the main pancreatic duct causing upstream dilation (5-20mm in the body and tail). Preliminary cytology review was positive for malignancy (adenocarcinoma) and then 4 gold fiducials were placed into the mass using the [employer] placement system. 2. CBD was normal, non-dilated. 3. Limited views of the liver, spleen were normal.      07/08/2019 Pathology Results   FINAL MICROSCOPIC DIAGNOSIS:  - Malignant cells consistent with adenocarcinoma    07/08/2019 Cancer Staging   Staging form: Exocrine Pancreas, AJCC 8th Edition - Clinical stage from 07/08/2019: Stage III (cT4, cN1, cM0) - Signed by Truitt Merle, MD on 07/13/2019   07/12/2019 Initial Diagnosis   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[place])   07/23/2019 - 08/06/2019 Chemotherapy   First-line Gemcitabine and Abraxane 2 weeks on/1 week off starting 07/23/19 with Gemcitabine alone for C1. Discontinued after 1 cycle due hospitalized twice for dyspnea, CHF and COPD exacerbation. I think her tolerance to chemo is very low.   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  The Common Hereditary Gene Panel offered by [facility] includes sequencing and/or deletion duplication testing of the following 56 genes: APC*, ATM*, AXIN2, BARD1, BMPR1A, BRCA1, BRCA2, BRIP1, CASR, CDH1, CDK4, CDKN2A (p14ARF), CDKN2A (p16INK4a),  CFTR*, CHEK2, CPA1, CTNNA1, CTRC, DICER1*, EPCAM*, FANCC, GREM1*, HOXB13, KIT, MEN1*, MLH1*, MSH2*, MSH3*, MSH6*, MUTYH, NBN, NF1*, NTHL1, PALB2, PALLD, PDGFRA, PMS2*, POLD1*, POLE, PRSS1*, PTEN*, RAD50, RAD51C, RAD51D, RNF43, SDHA*, SDHB, SDHC*, SDHD, SMAD4, SMARCA4, SPINK1, STK11, TP53, TSC1*, TSC2, and VHL.  The report date is September 21, 2019.      CURRENT THERAPY:  ***  INTERVAL HISTORY: *** CHARITIE HINOTE is here for a follow up after SBRT. She presents to the clinic alone.     REVIEW OF SYSTEMS:  *** Constitutional: Denies fevers, chills or abnormal weight loss Eyes: Denies blurriness of vision Ears, nose, mouth, throat, and face: Denies mucositis or sore throat Respiratory: Denies cough, dyspnea or wheezes Cardiovascular: Denies palpitation, chest discomfort or lower extremity swelling Gastrointestinal:  Denies nausea, heartburn or change in bowel habits Skin: Denies abnormal skin rashes Lymphatics: Denies new lymphadenopathy or easy bruising Neurological:Denies numbness, tingling or new weaknesses Behavioral/Psych: Mood is stable, no new changes  All other systems were reviewed with the patient and are negative.  MEDICAL HISTORY:  Past Medical History:  Diagnosis Date  . Accidental drug overdose   . Acute encephalopathy   . Acute on chronic respiratory failure with hypoxia ([place]) 03/01/2017  . Acute renal failure ([place]) 04/16/2014  . AKI (acute kidney injury) ([place]) 04/30/2014  . Altered mental status 06/27/2012  . Anxiety   . Atrial fibrillation ([place])   . CAD (coronary artery disease)   . CAP (community acquired pneumonia) 04/18/2014  . Cardiomyopathy ([place])   . Chronic hypoxemic respiratory failure ([place]) 06/26/2012  . Chronic pain   . COPD exacerbation ([place]) 06/26/2012  . COPD with acute exacerbation ([place]) 03/01/2017  . DEPRESSIVE DISORDER, NOS 03/13/2006   Qualifier: Diagnosis of  By: Damita Dunnings MD, Phillip Heal    .  Edema, peripheral   . Essential hypertension 04/30/2014  .  Fall at home, initial encounter 03/01/2017  . GASTROESOPHAGEAL REFLUX, NO ESOPHAGITIS 03/13/2006   Qualifier: Diagnosis of  By: Para March MD, Cheree Ditto    . HYPERCHOLESTEROLEMIA 03/13/2006   Qualifier: Diagnosis of  By: Para March MD, Cheree Ditto    . Hyperlipidemia   . HYPERTENSION, BENIGN SYSTEMIC 03/13/2006   Qualifier: Diagnosis of  By: Para March MD, Cheree Ditto    . Hypokalemia 04/30/2014  . Hyponatremia 04/30/2014  . Hypotension 04/30/2014  . Hypoxemia 06/26/2012  . Hypoxia 03/26/2018  . Irritable bowel syndrome 03/13/2006   Qualifier: Diagnosis of  By: Para March MD, Cheree Ditto    . Lung nodule 04/16/2014  . MENOPAUSAL SYNDROME 03/13/2006   Qualifier: Diagnosis of  By: Para March MD, Cheree Ditto    . Mood disorder [employer]) 03/13/2006   Qualifier: Diagnosis of  By: Para March MD, Cheree Ditto    . 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 Nausea 04/30/2014  . Nausea vomiting and diarrhea 04/16/2014  . OSTEOARTHRITIS, MULTI SITES 03/13/2006   Qualifier: Diagnosis of  By: Para March MD, Cheree Ditto    . Pulmonary nodule 04/30/2014  . Respiratory failure (HCC) 01/22/2017  . TOBACCO DEPENDENCE 03/13/2006   Qualifier: Diagnosis of  By: Para March MD, Cheree Ditto    . Weight loss     SURGICAL HISTORY: Past Surgical History:  Procedure Laterality Date  . ABDOMINAL HYSTERECTOMY    . ESOPHAGOGASTRODUODENOSCOPY N/A 07/08/2019   Procedure: ESOPHAGOGASTRODUODENOSCOPY (EGD);  Surgeon: Rachael Fee, MD;  Location: [facility];  Service: Endoscopy;  Laterality: N/A;  . ESOPHAGOGASTRODUODENOSCOPY (EGD) WITH PROPOFOL N/A 05/05/2019   Procedure: ESOPHAGOGASTRODUODENOSCOPY (EGD) WITH PROPOFOL;  Surgeon: Willis Modena, MD;  Location: [facility];  Service: Endoscopy;  Laterality: N/A;  . EUS N/A 07/08/2019   Procedure: UPPER ENDOSCOPIC ULTRASOUND (EUS) RADIAL;  Surgeon: Rachael Fee, MD;  Location: [facility];  Service: Endoscopy;  Laterality: N/A;  . EYE SURGERY    . FIDUCIAL MARKER PLACEMENT N/A 07/08/2019   Procedure: FIDUCIAL MARKER PLACEMENT;  Surgeon: Rachael Fee, MD;  Location: [facility];  Service: Endoscopy;  Laterality: N/A;  . FINE NEEDLE ASPIRATION N/A 05/05/2019   Procedure: FINE NEEDLE ASPIRATION (FNA) LINEAR;  Surgeon: Willis Modena, MD;  Location: [facility];  Service: Endoscopy;  Laterality: N/A;  . FINE NEEDLE ASPIRATION N/A 07/08/2019   Procedure: FINE NEEDLE ASPIRATION (FNA) LINEAR;  Surgeon: Rachael Fee, MD;  Location: [facility];  Service: Endoscopy;  Laterality: N/A;  . IR IMAGING GUIDED PORT INSERTION  07/27/2019  . ROTATOR CUFF REPAIR Right 2012  . UPPER ESOPHAGEAL ENDOSCOPIC ULTRASOUND (EUS) N/A 05/05/2019   Procedure: UPPER ESOPHAGEAL ENDOSCOPIC ULTRASOUND (EUS) with FNA;  Surgeon: Willis Modena, MD;  Location: [facility];  Service: Endoscopy;  Laterality: N/A;    I have reviewed the social history and family history with the patient and they are unchanged from previous note.  ALLERGIES:  has No Known Allergies.  MEDICATIONS:  Current Outpatient Medications  Medication Sig Dispense Refill  . albuterol (PROVENTIL HFA;VENTOLIN HFA) 108 (90 BASE) MCG/ACT inhaler Inhale 2 puffs into the lungs every 6 (six) hours as needed for wheezing. 1 Inhaler 2  . albuterol (PROVENTIL) (2.5 MG/3ML) 0.083% nebulizer solution Take 3 mLs (2.5 mg total) by nebulization every 2 (two) hours as needed for wheezing. 75 mL 12  . Aspirin-Salicylamide-Caffeine (BC HEADACHE PO) Take 1 packet by mouth 2 (two) times daily as needed (headaches).     Marland Kitchen atorvastatin (LIPITOR) 10 MG tablet Take 1 tablet (10 mg  total) by mouth at bedtime. (Patient taking differently: Take 10 mg by mouth every evening. ) 30 tablet 0  . Calcium Carbonate (CALCIUM 500 PO) Take 500 mg by mouth daily.    . Cholecalciferol (VITAMIN D3) 50 MCG (2000 UT) capsule Take 2,000 Units by mouth daily.    Marland Kitchen FLUoxetine (PROZAC) 40 MG capsule Take 40 mg by mouth every evening.     . furosemide (LASIX) 40 MG tablet Take 1 tablet (40 mg total) by mouth daily. 30 tablet 2  .  hydrALAZINE (APRESOLINE) 50 MG tablet Take 1 tablet (50 mg total) by mouth every 8 (eight) hours. 90 tablet 0  . lidocaine-prilocaine (EMLA) cream Apply to affected area as needed weekly 30 g 3  . losartan (COZAAR) 25 MG tablet Take 1 tablet (25 mg total) by mouth daily. 90 tablet 3  . metoprolol succinate (TOPROL-XL) 25 MG 24 hr tablet TAKE 2 TABLETS EVERY DAY. TAKE WITH OR IMMEDIATELY FOLLOWING A MEAL. 180 tablet 1  . nitroGLYCERIN (NITROSTAT) 0.4 MG SL tablet Place 1 tablet (0.4 mg total) under the tongue every 5 (five) minutes as needed for chest pain. 90 tablet 3  . omeprazole (PRILOSEC) 40 MG capsule Take 40 mg by mouth every evening.    . ondansetron (ZOFRAN) 8 MG tablet Take 1 tablet (8 mg total) by mouth 2 (two) times daily as needed (Nausea or vomiting). 30 tablet 1  . Oxycodone HCl 10 MG TABS     . potassium chloride (KLOR-CON) 10 MEQ tablet Take 10 mEq by mouth every evening.     . prochlorperazine (COMPAZINE) 10 MG tablet TAKE 1 TABLET BY MOUTH EVERY 6 HOURS AS NEEDED (NAUSEA OR VOMITING). 30 tablet 1   No current facility-administered medications for this visit.    PHYSICAL EXAMINATION: ECOG PERFORMANCE STATUS: {CHL ONC ECOG PS:[id number]}  There were no vitals filed for this visit. There were no vitals filed for this visit. *** GENERAL:alert, no distress and comfortable SKIN: skin color, texture, turgor are normal, no rashes or significant lesions EYES: normal, Conjunctiva are pink and non-injected, sclera clear {OROPHARYNX:no exudate, no erythema and lips, buccal mucosa, and tongue normal}  NECK: supple, thyroid normal size, non-tender, without nodularity LYMPH:  no palpable lymphadenopathy in the cervical, axillary {or inguinal} LUNGS: clear to auscultation and percussion with normal breathing effort HEART: regular rate & rhythm and no murmurs and no lower extremity edema ABDOMEN:abdomen soft, non-tender and normal bowel sounds Musculoskeletal:no cyanosis of digits and  no clubbing  NEURO: alert & oriented x 3 with fluent speech, no focal motor/sensory deficits  LABORATORY DATA:  I have reviewed the data as listed CBC Latest Ref Rng & Units 01/06/2020 10/15/2019 09/03/2019  WBC 4.0 - 10.5 K/uL 4.5 4.5 3.7(L)  Hemoglobin 12.0 - 15.0 g/dL 12.5 11.0(L) 9.0(L)  Hematocrit 36.0 - 46.0 % 36.6 33.8(L) 27.8(L)  Platelets 150 - 400 K/uL 135(L) 171 124(L)     CMP Latest Ref Rng & Units 01/06/2020 10/15/2019 09/03/2019  Glucose 70 - 99 mg/dL 118(H) 150(H) 91  BUN 8 - 23 mg/dL $Remove'10 11 13  'hHgbIgZ$ Creatinine 0.44 - 1.00 mg/dL 1.26(H) 1.34(H) 1.08(H)  Sodium 135 - 145 mmol/L 136 133(L) 137  Potassium 3.5 - 5.1 mmol/L 3.4(L) 4.0 3.8  Chloride 98 - 111 mmol/L 95(L) 102 103  CO2 22 - 32 mmol/L $RemoveB'29 22 24  'myHGXKFc$ Calcium 8.9 - 10.3 mg/dL 9.6 8.6(L) 8.7(L)  Total Protein 6.5 - 8.1 g/dL 7.0 6.1(L) 5.9(L)  Total Bilirubin 0.3 - 1.2 mg/dL 0.4  0.3 0.3  Alkaline Phos 38 - 126 U/L 79 95 102  AST 15 - 41 U/L 15 13(L) 14(L)  ALT 0 - 44 U/L 7 <6 13      RADIOGRAPHIC STUDIES: I have personally reviewed the radiological images as listed and agreed with the findings in the report. No results found.   ASSESSMENT & PLAN:  Allison Vaughan is a 75 y.o. female with    1. Pancreatic neoplasm,cT4N1M0,unresectable -Sheinitiallypresented with gradual weight loss and abdominal discomfort with low back pain for 6-12 months, CA 19-9 is elevated to 250. 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  -HerEUS/biopsyfrom 07/08/19 showed malignant cells consistent with adenocarcinoma. Due to vascular invasion, she is not a surgical candidatethus her cancer is no longer curable but still treatable to control disease and prolong her life.  -I started her on systemicmoderate intensitychemotherapy with gemcitabine/Abraxane (withsingle agentgemcitabineforcycle  1)on days 1 and 8 q21 daysbeginning 07/23/19. This was Discontinued after 1 cycle due hospitalized twice for dyspnea, CHF and COPD exacerbation.  -She proceeded with SBRT on 10/26/19-11/05/19 -Her CT chest from 01/06/20 showed ***  2. Abdominal bloating, weight loss, low back pain -her abdominal bloating/discomfort and weight loss are c/w malignancy, has been gradual over 6-12 months. -She weighed 150-160 lbs about 1.5 years ago. She attributes some of her weight loss to grieving her son's death this year. She has been able to gain some weight lately.  -her low back pain appears chronic, likely not entirely related to pancreatic mass -she is on long term oxycodone 10 mg q6h, managed by her PCP Dr. Nancy Fetter -pain and weight are stable overall  3. COPD -controlled, not oxygen dependent  -on albuterol PRN  -She has 50 year history of smoking 1-2PPD. She continues to smoke although counseled on cessation  4. Anxiety and depression -Chronic, controlled; managed by PCP  -Mood is stable on fluoxetine and ativan at bedtime   5. Afib, CAD, HTN -followed by PCP; on apixaban and atorvastatin  6. Goals of care -she understands because her cancer is not operable it is likely not curable. Treatment goal is palliative.   PLAN: ***    No problem-specific Assessment & Plan notes found for this encounter.   No orders of the defined types were placed in this encounter.  All questions were answered. The patient knows to call the clinic with any problems, questions or concerns. No barriers to learning was detected. The total time spent in the appointment was {CHL ONC TIME VISIT - VVZSM:[id number]}.     Joslyn Devon 01/17/2020   Oneal Deputy, am acting as scribe for Truitt Merle, MD.   {Add scribe attestation statement}

## 2020-01-17 NOTE — Telephone Encounter (Signed)
-----   Message from Pollyann Samples, NP sent at 01/17/2020 12:08 PM EST ----- Please let her know CA 19-9 is lower, and CT scan looks improved. Dr. Mosetta Putt will review more at f/up on 1/6.   Thanks, Clayborn Heron

## 2020-01-20 ENCOUNTER — Telehealth: Payer: Self-pay | Admitting: Hematology

## 2020-01-20 ENCOUNTER — Other Ambulatory Visit: Payer: Medicare HMO | Admitting: General Practice

## 2020-01-20 ENCOUNTER — Inpatient Hospital Stay: Payer: Medicare HMO | Attending: Nurse Practitioner | Admitting: Hematology

## 2020-01-20 DIAGNOSIS — Z79899 Other long term (current) drug therapy: Secondary | ICD-10-CM | POA: Insufficient documentation

## 2020-01-20 DIAGNOSIS — I4891 Unspecified atrial fibrillation: Secondary | ICD-10-CM | POA: Insufficient documentation

## 2020-01-20 DIAGNOSIS — I2510 Atherosclerotic heart disease of native coronary artery without angina pectoris: Secondary | ICD-10-CM | POA: Insufficient documentation

## 2020-01-20 DIAGNOSIS — I110 Hypertensive heart disease with heart failure: Secondary | ICD-10-CM | POA: Insufficient documentation

## 2020-01-20 DIAGNOSIS — F419 Anxiety disorder, unspecified: Secondary | ICD-10-CM | POA: Insufficient documentation

## 2020-01-20 DIAGNOSIS — C251 Malignant neoplasm of body of pancreas: Secondary | ICD-10-CM | POA: Insufficient documentation

## 2020-01-20 DIAGNOSIS — Z7982 Long term (current) use of aspirin: Secondary | ICD-10-CM | POA: Insufficient documentation

## 2020-01-20 DIAGNOSIS — F32A Depression, unspecified: Secondary | ICD-10-CM | POA: Insufficient documentation

## 2020-01-20 NOTE — Telephone Encounter (Signed)
Called pt 2x - no answer and vmail full . Unable to reach pt to reschedule appt per 1/6 sch msg

## 2020-01-24 ENCOUNTER — Telehealth: Payer: Self-pay | Admitting: Hematology

## 2020-01-24 NOTE — Telephone Encounter (Signed)
Scheduled appt per 1/10 sch msg - r/s appt

## 2020-01-24 NOTE — Progress Notes (Signed)
[facility]   Telephone:([phone] Fax:(336) [phone]   Clinic Follow up Note   Patient Care Team: Salli Real, MD as PCP - General (Internal Medicine) Rennis Golden Lisette Abu, MD as PCP - Cardiology (Cardiology) Radonna Ricker, RN as Oncology Nurse Navigator Pollyann Samples, NP as Nurse Practitioner (Nurse Practitioner) Malachy Mood, MD as Consulting Physician (Hematology) Mansouraty, Netty Starring., MD as Consulting Physician (Gastroenterology)  Date of Service:  01/26/2020  CHIEF COMPLAINT: F/u pancreaticcancer  SUMMARY OF ONCOLOGIC HISTORY: Oncology History Overview Note  Cancer Staging Primary adenocarcinoma of body of pancreas [facility]) Staging form: Exocrine Pancreas, AJCC 8th Edition - Clinical stage from 07/08/2019: Stage III (cT4, cN1, cM0) - Signed by Malachy Mood, MD on 07/13/2019    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HCC)  12/21/2018 Imaging   CT AP w contrast IMPRESSION: 1. Large amount of stool at the level of the rectum. Correlate for symptoms of fecal impaction. 2. Persistent dilatation of the distal pancreatic duct with abrupt cutoff at the level of the pancreatic body. Further evaluation with a nonemergent outpatient contrast enhanced MRI is recommended. 3. Nonobstructive left nephrolithiasis. 4. Subacute anterior right rib fractures.   Aortic Atherosclerosis (ICD10-I70.0).   12/24/2018 Imaging   MRCP IMPRESSION: 1. Suspect a 3.5 cm infiltrating pancreatic body neoplasm causing obstruction of the main pancreatic duct and atrophy of the pancreatic tail. The pancreatic head appears normal. 2. Limited examination due to lack of IV contrast and breathing motion artifact. Difficult to assess the vascular structures for invasion. 3. Recommend endoscopic biopsy. A dedicated high-resolution pancreatic CT scan with contrast may be helpful for further evaluation. 4. No obvious metastatic disease.   12/26/2018 Tumor Marker   CA 19-9: 250     05/05/2019 Procedure   EUS by Dr. Dulce Sellar An irregular mass was identified in the pancreatic body. The mass was hypoechoic. The mass measured 24 mm by 17 mm in maximal cross-sectional diameter. The endosonographic borders were poorlydefined. The remainder of the pancreas was examined. The endosonographic appearance of parenchyma and the upstream pancreatic duct indicated duct dilation. Appeared to be tissue ingrowth into portions of the dilated pancreatic duct. Fine needle aspiration for cytology was performed.  A few abnormal lymph nodes were visualized in the peripancreatic region. The nodes were oval, hypoechoic and had well defined margins. IMPRESSION - A mass was identified in the pancreatic body. This was staged T3 N1 Mx byendosonographic criteria. Fine needle aspiration performed. 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 - A few abnormal lymph nodes were visualized in the peripancreatic region. - Pancreatic parenchymal abnormalities consisting of edema and stranding were noted in the genu of the pancreas and pancreatic body. - There was no evidence of significant pathology.   05/29/2019 Imaging   CT CAP w contrast IMPRESSION: 1. Interval increase in size of poorly defined, hypoenhancing, infiltrating tumor arising from the body and proximal tail of pancreas. 2. Progressive vascular involvement of the portal vein, portal venous confluence, splenic vein, and celiac artery. 3. No specific features of solid organ metastases or metastatic disease to the chest. 4. Similar appearance of tiny, upper lung zone predominant solid and ground-glass attenuating nodules which may be postinflammatory/infectious in etiology. 5. Aortic atherosclerosis, in addition to 3 vessel coronary artery disease. Please note that although the presence of coronary artery calcium documents the presence of coronary artery disease, the severity of this disease and any potential  stenosis cannot be assessed on this non-gated CT examination. Assessment for potential  risk factor modification, dietary therapy or pharmacologic therapy may be warranted, if clinically indicated.   Aortic Atherosclerosis (ICD10-I70.0).   07/08/2019 Procedure   Repeat EUS for biopsy and fiducial placement by Dr. Ardis Hughs   1. Irregularly shaped, heterogeneous, hypoechoic mass was noted in the body of the pancreas, measuring 2.7cm across. The mass clearly invades the portal vein and obstructs the main pancreatic duct causing upstream dilation (5-40mm in the body and tail). Preliminary cytology review was positive for malignancy (adenocarcinoma) and then 4 gold fiducials were placed into the mass using the [employer] placement system. 2. CBD was normal, non-dilated. 3. Limited views of the liver, spleen were normal.      07/08/2019 Pathology Results   FINAL MICROSCOPIC DIAGNOSIS:  - Malignant cells consistent with adenocarcinoma    07/08/2019 Cancer Staging   Staging form: Exocrine Pancreas, AJCC 8th Edition - Clinical stage from 07/08/2019: Stage III (cT4, cN1, cM0) - Signed by Truitt Merle, MD on 07/13/2019   07/12/2019 Initial Diagnosis   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[place])   07/23/2019 - 08/06/2019 Chemotherapy   First-line Gemcitabine and Abraxane 2 weeks on/1 week off starting 07/23/19 with Gemcitabine alone for C1. Discontinued after 1 cycle due hospitalized twice for dyspnea, CHF and COPD exacerbation. I think her tolerance to chemo is very low.   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  The Common Hereditary Gene Panel offered by [facility] includes sequencing and/or deletion duplication testing of the following 56 genes: APC*, ATM*, AXIN2, BARD1, BMPR1A, BRCA1, BRCA2, BRIP1, CASR, CDH1, CDK4, CDKN2A (p14ARF), CDKN2A (p16INK4a),  CFTR*, CHEK2, CPA1, CTNNA1, CTRC, DICER1*, EPCAM*, FANCC, GREM1*, HOXB13, KIT, MEN1*, MLH1*, MSH2*, MSH3*, MSH6*, MUTYH, NBN, NF1*, NTHL1, PALB2, PALLD, PDGFRA, PMS2*, POLD1*, POLE, PRSS1*, PTEN*, RAD50, RAD51C, RAD51D, RNF43, SDHA*, SDHB, SDHC*, SDHD, SMAD4, SMARCA4, SPINK1, STK11, TP53, TSC1*, TSC2, and VHL.  The report date is September 21, 2019.   10/26/2019 - 11/05/2019 Radiation Therapy   SBRT with Dr Lisbeth Renshaw    01/06/2020 Imaging   CT CAP  IMPRESSION: 1. Diminished soft tissue thickening and ductal dilatation of the pancreatic body and tail about biopsy marking clips. Findings are consistent with treatment response of pancreatic malignancy. 2. Redemonstrated soft tissue thickening with improved narrowing about the central portal vein, with persistent effacement of the splenic vein, consistent with treatment response. 3. No evidence of lymphadenopathy or metastatic disease within the chest, abdomen, or pelvis. 4. Innumerable tiny centrilobular ground-glass nodules throughout the lungs, most concentrated at the apices, nonspecific and infectious or inflammatory, but most commonly seen in smoking-related respiratory bronchiolitis. Metastatic disease not favored. 5. Diffuse bilateral bronchial wall thickening with diffuse bilateral bronchial wall thickening, consistent with nonspecific infectious or inflammatory bronchitis. 6. Nonobstructive right nephrolithiasis versus vascular calcification. 7. Coronary artery disease.   Aortic Atherosclerosis (ICD10-I70.0).      CURRENT THERAPY:  Observation   INTERVAL HISTORY:  Allison Vaughan is here for a follow up after SBRT. She presents to the clinic with daughter. She notes she is doing fairly well. She notes she tolerated radiation well, per daughter. She had mild nausea. She notes abdominal pain mainly at night lately. This effects her sleep. She takes oxycodone $RemoveBeforeD'10mg'HoJDyBgnXyMjch$  3-4 tabs a day. She notes this only controls her pain some days.  Dr. Nancy Fetter has not wanted to alter her medication although she has cancer. She is  still smoking 1ppd. Her daughter notes she has decreased short term memory and notes she is often bored. She has breathing treatment which she has needed more often recently.     REVIEW OF SYSTEMS:   Constitutional: Denies fevers, chills or abnormal weight loss Eyes: Denies blurriness of vision Ears, nose, mouth, throat, and face: Denies mucositis or sore throat Respiratory: Denies cough, dyspnea or wheezes Cardiovascular: Denies palpitation, chest discomfort or lower extremity swelling Gastrointestinal:  Denies nausea, heartburn or change in bowel habits (+) Abdominal pain  Skin: Denies abnormal skin rashes Lymphatics: Denies new lymphadenopathy or easy bruising Neurological:Denies numbness, tingling or new weaknesses Behavioral/Psych: Mood is stable, no new changes  All other systems were reviewed with the patient and are negative.  MEDICAL HISTORY:  Past Medical History:  Diagnosis Date  . Accidental drug overdose   . Acute encephalopathy   . Acute on chronic respiratory failure with hypoxia ([place]) 03/01/2017  . Acute renal failure ([place]) 04/16/2014  . AKI (acute kidney injury) ([place]) 04/30/2014  . Altered mental status 06/27/2012  . Anxiety   . Atrial fibrillation ([place])   . CAD (coronary artery disease)   . CAP (community acquired pneumonia) 04/18/2014  . Cardiomyopathy ([place])   . Chronic hypoxemic respiratory failure ([place]) 06/26/2012  . Chronic pain   . COPD exacerbation ([place]) 06/26/2012  . COPD with acute exacerbation ([place]) 03/01/2017  . DEPRESSIVE DISORDER, NOS 03/13/2006   Qualifier: Diagnosis of  By: Damita Dunnings MD, Phillip Heal    . Edema, peripheral   . Essential hypertension 04/30/2014  . Fall at home, initial encounter 03/01/2017  . GASTROESOPHAGEAL REFLUX, NO ESOPHAGITIS 03/13/2006   Qualifier: Diagnosis of  By: Damita Dunnings MD, Phillip Heal    . HYPERCHOLESTEROLEMIA 03/13/2006   Qualifier: Diagnosis of  By: Damita Dunnings MD,  Phillip Heal    . Hyperlipidemia   . HYPERTENSION, BENIGN SYSTEMIC 03/13/2006   Qualifier: Diagnosis of  By: Damita Dunnings MD, Phillip Heal    . Hypokalemia 04/30/2014  . Hyponatremia 04/30/2014  . Hypotension 04/30/2014  . Hypoxemia 06/26/2012  . Hypoxia 03/26/2018  . Irritable bowel syndrome 03/13/2006   Qualifier: Diagnosis of  By: Damita Dunnings MD, Phillip Heal    . Lung nodule 04/16/2014  . MENOPAUSAL SYNDROME 03/13/2006   Qualifier: Diagnosis of  By: Damita Dunnings MD, Phillip Heal    . Mood disorder [facility]) 03/13/2006   Qualifier: Diagnosis of  By: Damita Dunnings MD, Phillip Heal    . 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 Nausea 04/30/2014  . Nausea vomiting and diarrhea 04/16/2014  . OSTEOARTHRITIS, MULTI SITES 03/13/2006   Qualifier: Diagnosis of  By: Damita Dunnings MD, Phillip Heal    . Pulmonary nodule 04/30/2014  . Respiratory failure ([place]) 01/22/2017  . TOBACCO DEPENDENCE 03/13/2006   Qualifier: Diagnosis of  By: Damita Dunnings MD, Phillip Heal    . Weight loss     SURGICAL HISTORY: Past Surgical History:  Procedure Laterality Date  . ABDOMINAL HYSTERECTOMY    . ESOPHAGOGASTRODUODENOSCOPY N/A 07/08/2019   Procedure: ESOPHAGOGASTRODUODENOSCOPY (EGD);  Surgeon: Milus Banister, MD;  Location: [facility];  Service: Endoscopy;  Laterality: N/A;  . ESOPHAGOGASTRODUODENOSCOPY (EGD) WITH PROPOFOL N/A 05/05/2019   Procedure: ESOPHAGOGASTRODUODENOSCOPY (EGD) WITH PROPOFOL;  Surgeon: Arta Silence, MD;  Location: [facility] END[facility];  Service: Endoscopy;  Laterality: N/A;  . EUS N/A 07/08/2019   Procedure: UPPER ENDOSCOPIC ULTRASOUND (EUS) RADIAL;  Surgeon: Milus Banister, MD;  Location: [facility] ENDOSCOPY;  Service: Endoscopy;  Laterality: N/A;  . EYE SURGERY    . FIDUCIAL MARKER PLACEMENT N/A 07/08/2019   Procedure: FIDUCIAL MARKER PLACEMENT;  Surgeon: Milus Banister, MD;  Location: [facility];  Service: Endoscopy;  Laterality: N/A;  . FINE NEEDLE ASPIRATION N/A 05/05/2019   Procedure: FINE NEEDLE ASPIRATION (FNA) LINEAR;  Surgeon: Arta Silence, MD;  Location: [facility];   Service: Endoscopy;  Laterality: N/A;  . FINE NEEDLE ASPIRATION N/A 07/08/2019   Procedure: FINE NEEDLE ASPIRATION (FNA) LINEAR;  Surgeon: Milus Banister, MD;  Location: [facility];  Service: Endoscopy;  Laterality: N/A;  . IR IMAGING GUIDED PORT INSERTION  07/27/2019  . ROTATOR CUFF REPAIR Right 2012  . UPPER ESOPHAGEAL ENDOSCOPIC ULTRASOUND (EUS) N/A 05/05/2019   Procedure: UPPER ESOPHAGEAL ENDOSCOPIC ULTRASOUND (EUS) with FNA;  Surgeon: Arta Silence, MD;  Location: [facility];  Service: Endoscopy;  Laterality: N/A;    I have reviewed the social history and family history with the patient and they are unchanged from previous note.  ALLERGIES:  has No Known Allergies.  MEDICATIONS:  Current Outpatient Medications  Medication Sig Dispense Refill  . albuterol (PROVENTIL HFA;VENTOLIN HFA) 108 (90 BASE) MCG/ACT inhaler Inhale 2 puffs into the lungs every 6 (six) hours as needed for wheezing. 1 Inhaler 2  . albuterol (PROVENTIL) (2.5 MG/3ML) 0.083% nebulizer solution Take 3 mLs (2.5 mg total) by nebulization every 2 (two) hours as needed for wheezing. 75 mL 12  . Aspirin-Salicylamide-Caffeine (BC HEADACHE PO) Take 1 packet by mouth 2 (two) times daily as needed (headaches).     Marland Kitchen atorvastatin (LIPITOR) 10 MG tablet Take 1 tablet (10 mg total) by mouth at bedtime. (Patient taking differently: Take 10 mg by mouth every evening. ) 30 tablet 0  . Calcium Carbonate (CALCIUM 500 PO) Take 500 mg by mouth daily.    . Cholecalciferol (VITAMIN D3) 50 MCG (2000 UT) capsule Take 2,000 Units by mouth daily.    Marland Kitchen FLUoxetine (PROZAC) 40 MG capsule Take 40 mg by mouth every evening.     . hydrALAZINE (APRESOLINE) 50 MG tablet Take 1 tablet (50 mg total) by mouth every 8 (eight) hours. 90 tablet 0  . lidocaine-prilocaine (EMLA) cream Apply to affected area as needed weekly 30 g 3  . losartan (COZAAR) 25 MG tablet Take 1 tablet (25 mg total) by mouth daily. 90 tablet 3  . metoprolol succinate (TOPROL-XL)  25 MG 24 hr tablet TAKE 2 TABLETS EVERY DAY. TAKE WITH OR IMMEDIATELY FOLLOWING A MEAL. 180 tablet 1  . nitroGLYCERIN (NITROSTAT) 0.4 MG SL tablet Place 1 tablet (0.4 mg total) under the tongue every 5 (five) minutes as needed for chest pain. 90 tablet 3  . omeprazole (PRILOSEC) 40 MG capsule Take 40 mg by mouth every evening.    . ondansetron (ZOFRAN) 8 MG tablet Take 1 tablet (8 mg total) by mouth 2 (two) times daily as needed (Nausea or vomiting). 30 tablet 1  . Oxycodone HCl 10 MG TABS     . potassium chloride (KLOR-CON) 10 MEQ tablet Take 10 mEq by mouth every evening.     . prochlorperazine (COMPAZINE) 10 MG tablet TAKE 1 TABLET BY MOUTH EVERY 6 HOURS AS NEEDED (NAUSEA OR VOMITING). 30 tablet 1   No current facility-administered medications for this visit.    PHYSICAL EXAMINATION: ECOG PERFORMANCE STATUS: 2 - Symptomatic, <50% confined to bed  Vitals:   01/26/20 0900  BP: 134/78  Pulse: 93  Resp: 16  Temp: (!) 96.8 F (36 C)  SpO2: 97%   Filed Weights   01/26/20 0900  Weight: 105 lb 1.6 oz (47.7 kg)    Due to  COVID19 we will limit examination to appearance. Patient had no complaints.  GENERAL:alert, no distress and comfortable SKIN: skin color normal, no rashes or significant lesions EYES: normal, Conjunctiva are pink and non-injected, sclera clear  NEURO: alert & oriented x 3 with fluent speech   LABORATORY DATA:  I have reviewed the data as listed CBC Latest Ref Rng & Units 01/06/2020 10/15/2019 09/03/2019  WBC 4.0 - 10.5 K/uL 4.5 4.5 3.7(L)  Hemoglobin 12.0 - 15.0 g/dL 12.5 11.0(L) 9.0(L)  Hematocrit 36.0 - 46.0 % 36.6 33.8(L) 27.8(L)  Platelets 150 - 400 K/uL 135(L) 171 124(L)     CMP Latest Ref Rng & Units 01/06/2020 10/15/2019 09/03/2019  Glucose 70 - 99 mg/dL 118(H) 150(H) 91  BUN 8 - 23 mg/dL $Remove'10 11 13  'AQyhlHZ$ Creatinine 0.44 - 1.00 mg/dL 1.26(H) 1.34(H) 1.08(H)  Sodium 135 - 145 mmol/L 136 133(L) 137  Potassium 3.5 - 5.1 mmol/L 3.4(L) 4.0 3.8  Chloride 98 - 111  mmol/L 95(L) 102 103  CO2 22 - 32 mmol/L $RemoveB'29 22 24  'TaXgZcYH$ Calcium 8.9 - 10.3 mg/dL 9.6 8.6(L) 8.7(L)  Total Protein 6.5 - 8.1 g/dL 7.0 6.1(L) 5.9(L)  Total Bilirubin 0.3 - 1.2 mg/dL 0.4 0.3 0.3  Alkaline Phos 38 - 126 U/L 79 95 102  AST 15 - 41 U/L 15 13(L) 14(L)  ALT 0 - 44 U/L 7 <6 13      RADIOGRAPHIC STUDIES: I have personally reviewed the radiological images as listed and agreed with the findings in the report. No results found.   ASSESSMENT & PLAN:  DANYELA POSAS is a 75 y.o. female with   1. Pancreatic neoplasm,cT4N1M0,unresectable -Sheinitiallypresented with gradual weight loss and abdominal discomfort with low back pain for 6-12 months, CA 19-9 is elevated to 250. 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  -HerEUS/biopsyfrom 07/08/19 showed malignant cells consistent with adenocarcinoma. Due to vascular invasion, she is not a surgical candidatethus her cancer is no longer curable but still treatable to control disease and prolong her life.  -I started her on systemicmoderate intensitychemotherapy with gemcitabine/Abraxane (withsingle agentgemcitabineforcycle 1)on days 1 and 8 q21 daysbeginning 07/23/19. This was Discontinued after 1 cycle of gemcitabine due hospitalized twice for dyspnea, CHF and COPD exacerbation.  -She proceeded with SBRT on 10/26/19-11/05/19 -I personally reviewed her restaging CT scan from 01/06/2020 which showed partial response to treatment, no evidence of new metastasis. She was happy to hear the good news  -I discussed given prior poor toleration of chemo, her medical comorbilities and memory loss, I do not think she can tolerate chemo in future. I recommend observation for now. Will repeat scan in 4 months. She is agreeable.  -I also discussed the option of palliative or hospice care at any point to  help manage her symptoms at home. They will continue to think about it. Her daughter seems to be open to that  -She can continue port flushes every 8 weeks.  -F/u in 2 months   2. Abdominal bloating, weight loss, low back pain -Her abdominal bloating/discomfort and weight loss are c/w malignancy, has been gradual over 6-12 months. -She weighed 150-160 lbs before cancer diagnosis. -Her low back pain appears chronic, likely not entirely related to pancreatic mass -She is on long term oxycodone 10 mg q6h, managed by her PCP Dr. Nancy Fetter. Pain is not well controlled lately. I am willing to discuss changing pain management to Palliative Care Dr Hilma Favors  with Dr Nancy Fetter.   3. COPD -controlled, not oxygen dependent  -on albuterol PRN  -She has 50 year history of smoking 1-2PPD. She continues to smoke 1ppd although counseled on cessation -Pt notes her breathing adequacy has decreased. She is currently on breathing treatment but not being seen by pulmonologist currently. She can continue to f/u with Dr. Nancy Fetter.   4. Anxiety and depression, Decreased memory  -Chronic, controlled; managed by PCP  -Mood is stable on fluoxetine and ativan at bedtime  -Her daughter notes she has been having decreased shot term memory lately.   5. Afib, CAD, HTN -followed by PCP; on apixaban and atorvastatin  6. Goals of care -she understands because her cancer is not operable it is likely not curable.   7. Pain management  -she has been on oxycodone for chronic back pain, managed by her PCP Dr. Nancy Fetter -she now has intermittent epigastric pain which is likely related to her pancreatic cancer  -if Dr. Nancy Fetter would like [place] to manage her pain, will put her on MS contin $RemoveB'15mg'kbyCQRgD$  at night. She has appointment with Dr. Nancy Fetter this afternoon, and will call me  PLAN: -Port flush next week  -Lab, flush, f/u in 2 months  -if her PCP Dr. Nancy Fetter wants [place] to manage her pain, will add MS Contin $RemoveB'15mg'SjGFlIoA$  at night    No problem-specific Assessment &  Plan notes found for this encounter.   No orders of the defined types were placed in this encounter.  All questions were answered. The patient knows to call the clinic with any problems, questions or concerns. No barriers to learning was detected. The total time spent in the appointment was 30 minutes.     Truitt Merle, MD 01/26/2020   I, Joslyn Devon, am acting as scribe for Truitt Merle, MD.   I have reviewed the above documentation for accuracy and completeness, and I agree with the above.

## 2020-01-26 ENCOUNTER — Other Ambulatory Visit: Payer: Self-pay

## 2020-01-26 ENCOUNTER — Inpatient Hospital Stay (HOSPITAL_BASED_OUTPATIENT_CLINIC_OR_DEPARTMENT_OTHER): Payer: Medicare HMO | Admitting: Hematology

## 2020-01-26 ENCOUNTER — Encounter: Payer: Self-pay | Admitting: Hematology

## 2020-01-26 VITALS — BP 134/78 | HR 93 | Temp 96.8°F | Resp 16 | Ht 60.0 in | Wt 105.1 lb

## 2020-01-26 DIAGNOSIS — J42 Unspecified chronic bronchitis: Secondary | ICD-10-CM | POA: Diagnosis not present

## 2020-01-26 DIAGNOSIS — F419 Anxiety disorder, unspecified: Secondary | ICD-10-CM | POA: Diagnosis not present

## 2020-01-26 DIAGNOSIS — Z79899 Other long term (current) drug therapy: Secondary | ICD-10-CM | POA: Diagnosis not present

## 2020-01-26 DIAGNOSIS — I110 Hypertensive heart disease with heart failure: Secondary | ICD-10-CM | POA: Diagnosis not present

## 2020-01-26 DIAGNOSIS — Z7982 Long term (current) use of aspirin: Secondary | ICD-10-CM | POA: Diagnosis not present

## 2020-01-26 DIAGNOSIS — I4891 Unspecified atrial fibrillation: Secondary | ICD-10-CM | POA: Diagnosis not present

## 2020-01-26 DIAGNOSIS — F32A Depression, unspecified: Secondary | ICD-10-CM | POA: Diagnosis not present

## 2020-01-26 DIAGNOSIS — C251 Malignant neoplasm of body of pancreas: Secondary | ICD-10-CM | POA: Diagnosis present

## 2020-01-26 DIAGNOSIS — I2510 Atherosclerotic heart disease of native coronary artery without angina pectoris: Secondary | ICD-10-CM | POA: Diagnosis not present

## 2020-01-27 ENCOUNTER — Telehealth: Payer: Self-pay | Admitting: Hematology

## 2020-01-27 ENCOUNTER — Other Ambulatory Visit: Payer: Self-pay | Admitting: Cardiology

## 2020-01-27 NOTE — Telephone Encounter (Signed)
Scheduled appts per 1/12 los. Made several attempts to contact pt. Pt's voicemail box was full.

## 2020-02-02 ENCOUNTER — Inpatient Hospital Stay: Payer: Medicare HMO

## 2020-02-14 ENCOUNTER — Telehealth: Payer: Self-pay | Admitting: *Deleted

## 2020-02-14 NOTE — Telephone Encounter (Signed)
Received call from pt's daughter, [name] asking for return call.  Returned call & she states that mother needs Morphine which Dr Burr Medico discussed at last visit.  Dr Nancy Fetter, PCP has controlled her pain meds & she discussed letting Dr Burr Medico take over but he didn't switch over although he said OK.  Message to Dr Burr Medico.

## 2020-02-15 NOTE — Telephone Encounter (Signed)
I called Dr Lynnda Child office & was on hold over 20 minutes & finally spoke to someone but not sure they understood what was needed.  Routed message to Dr Nancy Fetter.

## 2020-02-15 NOTE — Telephone Encounter (Signed)
Please get Dr. Lynnda Child last office note for me to review, before I take over her pain management. Thanks   Truitt Merle MD

## 2020-02-21 ENCOUNTER — Telehealth: Payer: Self-pay

## 2020-02-21 NOTE — Telephone Encounter (Signed)
[name] called stating Ms Linnemann needs medication.  I returned her call.  I told her we are still waiting for Dr Lynnda Child last office note.  She said they spoke with his office and Ms Niebla needed to sign a release.  I gave her my direct fax number.  I told her after we received that I would contact her.

## 2020-03-21 ENCOUNTER — Telehealth: Payer: Self-pay | Admitting: Hematology

## 2020-03-21 NOTE — Telephone Encounter (Signed)
Called pt per MD schedule change. Rescheduled appt from 3/16 to 3/15 - left message for patient daughter with appt date and time

## 2020-03-24 NOTE — Progress Notes (Incomplete)
Allison Vaughan   [phone] [phone]   Clinic Follow up Note   Patient Care Team: Sandi Mariscal, MD as PCP - General (Internal Medicine) Debara Pickett Nadean Corwin, MD as PCP - Cardiology (Cardiology) Jonnie Finner, RN as Oncology Nurse Navigator Alla Feeling, NP as Nurse Practitioner (Nurse Practitioner) Truitt Merle, MD as Consulting Physician (Hematology) Mansouraty, Telford Nab., MD as Consulting Physician (Gastroenterology)  Date of Service:  03/24/2020  CHIEF COMPLAINT: F/u of pancreatic cancer   SUMMARY OF ONCOLOGIC HISTORY: Oncology History Overview Note  Cancer Staging Primary adenocarcinoma of body of pancreas [facility]) Staging form: Exocrine Pancreas, AJCC 8th Edition - Clinical stage from 07/08/2019: Stage III (cT4, cN1, cM0) - Signed by Truitt Merle, MD on 07/13/2019    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[place])  12/21/2018 Imaging   CT AP w contrast IMPRESSION: 1. Large amount of stool at the level of the rectum. Correlate for symptoms of fecal impaction. 2. Persistent dilatation of the distal pancreatic duct with abrupt cutoff at the level of the pancreatic body. Further evaluation with a nonemergent outpatient contrast enhanced MRI is recommended. 3. Nonobstructive left nephrolithiasis. 4. Subacute anterior right rib fractures.   Aortic Atherosclerosis (ICD10-I70.0).   12/24/2018 Imaging   MRCP IMPRESSION: 1. Suspect a 3.5 cm infiltrating pancreatic body neoplasm causing obstruction of the main pancreatic duct and atrophy of the pancreatic tail. The pancreatic head appears normal. 2. Limited examination due to lack of IV contrast and breathing motion artifact. Difficult to assess the vascular structures for invasion. 3. Recommend endoscopic biopsy. A dedicated high-resolution pancreatic CT scan with contrast may be helpful for further evaluation. 4. No obvious metastatic disease.   12/26/2018 Tumor Marker   CA 19-9: 250     05/05/2019 Procedure   EUS by Dr. Paulita Fujita An irregular mass was identified in the pancreatic body. The mass was hypoechoic. The mass measured 24 mm by 17 mm in maximal cross-sectional diameter. The endosonographic borders were poorlydefined. The remainder of the pancreas was examined. The endosonographic appearance of parenchyma and the upstream pancreatic duct indicated duct dilation. Appeared to be tissue ingrowth into portions of the dilated pancreatic duct. Fine needle aspiration for cytology was performed.  A few abnormal lymph nodes were visualized in the peripancreatic region. The nodes were oval, hypoechoic and had well defined margins. IMPRESSION - A mass was identified in the pancreatic body. This was staged T3 N1 Mx byendosonographic criteria. Fine needle aspiration performed. 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 - A few abnormal lymph nodes were visualized in the peripancreatic region. - Pancreatic parenchymal abnormalities consisting of edema and stranding were noted in the genu of the pancreas and pancreatic body. - There was no evidence of significant pathology.   05/29/2019 Imaging   CT CAP w contrast IMPRESSION: 1. Interval increase in size of poorly defined, hypoenhancing, infiltrating tumor arising from the body and proximal tail of pancreas. 2. Progressive vascular involvement of the portal vein, portal venous confluence, splenic vein, and celiac artery. 3. No specific features of solid organ metastases or metastatic disease to the chest. 4. Similar appearance of tiny, upper lung zone predominant solid and ground-glass attenuating nodules which may be postinflammatory/infectious in etiology. 5. Aortic atherosclerosis, in addition to 3 vessel coronary artery disease. Please note that although the presence of coronary artery calcium documents the presence of coronary artery disease, the severity of this disease and any potential  stenosis cannot be assessed on this non-gated CT examination.  Assessment for potential risk factor modification, dietary therapy or pharmacologic therapy may be warranted, if clinically indicated.   Aortic Atherosclerosis (ICD10-I70.0).   07/08/2019 Procedure   Repeat EUS for biopsy and fiducial placement by Dr. Christella Hartigan   1. Irregularly shaped, heterogeneous, hypoechoic mass was noted in the body of the pancreas, measuring 2.7cm across. The mass clearly invades the portal vein and obstructs the main pancreatic duct causing upstream dilation (5-36mm in the body and tail). Preliminary cytology review was positive for malignancy (adenocarcinoma) and then 4 gold fiducials were placed into the mass using the [employer] placement system. 2. CBD was normal, non-dilated. 3. Limited views of the liver, spleen were normal.      07/08/2019 Pathology Results   FINAL MICROSCOPIC DIAGNOSIS:  - Malignant cells consistent with adenocarcinoma    07/08/2019 Cancer Staging   Staging form: Exocrine Pancreas, AJCC 8th Edition - Clinical stage from 07/08/2019: Stage III (cT4, cN1, cM0) - Signed by Malachy Mood, MD on 07/13/2019   07/12/2019 Initial Diagnosis   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HCC)   07/23/2019 - 08/06/2019 Chemotherapy   First-line Gemcitabine and Abraxane 2 weeks on/1 week off starting 07/23/19 with Gemcitabine alone for C1. Discontinued after 1 cycle due hospitalized twice for dyspnea, CHF and COPD exacerbation. I think her tolerance to chemo is very low.   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  The Common Hereditary Gene Panel offered by [facility] includes sequencing and/or deletion duplication testing of the following 56 genes: APC*, ATM*, AXIN2, BARD1, BMPR1A, BRCA1, BRCA2, BRIP1, CASR, CDH1, CDK4, CDKN2A (p14ARF), CDKN2A (p16INK4a),  CFTR*, CHEK2, CPA1, CTNNA1, CTRC, DICER1*, EPCAM*, FANCC, GREM1*, HOXB13, KIT, MEN1*, MLH1*, MSH2*, MSH3*, MSH6*, MUTYH, NBN, NF1*, NTHL1, PALB2, PALLD, PDGFRA, PMS2*, POLD1*, POLE, PRSS1*, PTEN*, RAD50, RAD51C, RAD51D, RNF43, SDHA*, SDHB, SDHC*, SDHD, SMAD4, SMARCA4, SPINK1, STK11, TP53, TSC1*, TSC2, and VHL.  The report date is September 21, 2019.   10/26/2019 - 11/05/2019 Radiation Therapy   SBRT with Dr Mitzi Hansen    01/06/2020 Imaging   CT CAP  IMPRESSION: 1. Diminished soft tissue thickening and ductal dilatation of the pancreatic body and tail about biopsy marking clips. Findings are consistent with treatment response of pancreatic malignancy. 2. Redemonstrated soft tissue thickening with improved narrowing about the central portal vein, with persistent effacement of the splenic vein, consistent with treatment response. 3. No evidence of lymphadenopathy or metastatic disease within the chest, abdomen, or pelvis. 4. Innumerable tiny centrilobular ground-glass nodules throughout the lungs, most concentrated at the apices, nonspecific and infectious or inflammatory, but most commonly seen in smoking-related respiratory bronchiolitis. Metastatic disease not favored. 5. Diffuse bilateral bronchial wall thickening with diffuse bilateral bronchial wall thickening, consistent with nonspecific infectious or inflammatory bronchitis. 6. Nonobstructive right nephrolithiasis versus vascular calcification. 7. Coronary artery disease.   Aortic Atherosclerosis (ICD10-I70.0).      CURRENT THERAPY:  Observation   INTERVAL HISTORY: *** Allison Vaughan is here for a follow up after SBRT. She presents to the clinic alone.    REVIEW OF SYSTEMS:  *** Constitutional: Denies fevers, chills or abnormal weight loss Eyes: Denies blurriness of vision Ears, nose, mouth, throat, and face: Denies mucositis or sore throat Respiratory: Denies cough, dyspnea or wheezes Cardiovascular: Denies  palpitation, chest discomfort or lower extremity swelling Gastrointestinal:  Denies nausea, heartburn or change in bowel habits Skin: Denies abnormal skin  rashes Lymphatics: Denies new lymphadenopathy or easy bruising Neurological:Denies numbness, tingling or new weaknesses Behavioral/Psych: Mood is stable, no new changes  All other systems were reviewed with the patient and are negative.  MEDICAL HISTORY:  Past Medical History:  Diagnosis Date  . Accidental drug overdose   . Acute encephalopathy   . Acute on chronic respiratory failure with hypoxia ([place]) 03/01/2017  . Acute renal failure ([place]) 04/16/2014  . AKI (acute kidney injury) ([place]) 04/30/2014  . Altered mental status 06/27/2012  . Anxiety   . Atrial fibrillation ([place])   . CAD (coronary artery disease)   . CAP (community acquired pneumonia) 04/18/2014  . Cardiomyopathy ([place])   . Chronic hypoxemic respiratory failure ([place]) 06/26/2012  . Chronic pain   . COPD exacerbation ([place]) 06/26/2012  . COPD with acute exacerbation ([place]) 03/01/2017  . DEPRESSIVE DISORDER, NOS 03/13/2006   Qualifier: Diagnosis of  By: Damita Dunnings MD, Phillip Heal    . Edema, peripheral   . Essential hypertension 04/30/2014  . Fall at home, initial encounter 03/01/2017  . GASTROESOPHAGEAL REFLUX, NO ESOPHAGITIS 03/13/2006   Qualifier: Diagnosis of  By: Damita Dunnings MD, Phillip Heal    . HYPERCHOLESTEROLEMIA 03/13/2006   Qualifier: Diagnosis of  By: Damita Dunnings MD, Phillip Heal    . Hyperlipidemia   . HYPERTENSION, BENIGN SYSTEMIC 03/13/2006   Qualifier: Diagnosis of  By: Damita Dunnings MD, Phillip Heal    . Hypokalemia 04/30/2014  . Hyponatremia 04/30/2014  . Hypotension 04/30/2014  . Hypoxemia 06/26/2012  . Hypoxia 03/26/2018  . Irritable bowel syndrome 03/13/2006   Qualifier: Diagnosis of  By: Damita Dunnings MD, Phillip Heal    . Lung nodule 04/16/2014  . MENOPAUSAL SYNDROME 03/13/2006   Qualifier: Diagnosis of  By: Damita Dunnings MD, Phillip Heal    . Mood disorder [facility]) 03/13/2006   Qualifier: Diagnosis of  By: Damita Dunnings MD, Phillip Heal    .  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[place])   . Nausea 04/30/2014  . Nausea vomiting and diarrhea 04/16/2014  . OSTEOARTHRITIS, MULTI SITES 03/13/2006   Qualifier: Diagnosis of  By: Damita Dunnings MD, Phillip Heal    . Pulmonary nodule 04/30/2014  . Respiratory failure ([place]) 01/22/2017  . TOBACCO DEPENDENCE 03/13/2006   Qualifier: Diagnosis of  By: Damita Dunnings MD, Phillip Heal    . Weight loss     SURGICAL HISTORY: Past Surgical History:  Procedure Laterality Date  . ABDOMINAL HYSTERECTOMY    . ESOPHAGOGASTRODUODENOSCOPY N/A 07/08/2019   Procedure: ESOPHAGOGASTRODUODENOSCOPY (EGD);  Surgeon: Milus Banister, MD;  Location: [facility];  Service: Endoscopy;  Laterality: N/A;  . ESOPHAGOGASTRODUODENOSCOPY (EGD) WITH PROPOFOL N/A 05/05/2019   Procedure: ESOPHAGOGASTRODUODENOSCOPY (EGD) WITH PROPOFOL;  Surgeon: Arta Silence, MD;  Location: [facility];  Service: Endoscopy;  Laterality: N/A;  . EUS N/A 07/08/2019   Procedure: UPPER ENDOSCOPIC ULTRASOUND (EUS) RADIAL;  Surgeon: Milus Banister, MD;  Location: [facility];  Service: Endoscopy;  Laterality: N/A;  . EYE SURGERY    . FIDUCIAL MARKER PLACEMENT N/A 07/08/2019   Procedure: FIDUCIAL MARKER PLACEMENT;  Surgeon: Milus Banister, MD;  Location: [facility];  Service: Endoscopy;  Laterality: N/A;  . FINE NEEDLE ASPIRATION N/A 05/05/2019   Procedure: FINE NEEDLE ASPIRATION (FNA) LINEAR;  Surgeon: Arta Silence, MD;  Location: [facility];  Service: Endoscopy;  Laterality: N/A;  . FINE NEEDLE ASPIRATION N/A 07/08/2019   Procedure: FINE NEEDLE ASPIRATION (FNA) LINEAR;  Surgeon: Milus Banister, MD;  Location: [facility];  Service: Endoscopy;  Laterality: N/A;  . IR IMAGING GUIDED PORT INSERTION  07/27/2019  . ROTATOR CUFF REPAIR Right 2012  .  UPPER ESOPHAGEAL ENDOSCOPIC ULTRASOUND (EUS) N/A 05/05/2019   Procedure: UPPER ESOPHAGEAL ENDOSCOPIC ULTRASOUND (EUS) with FNA;  Surgeon: Arta Silence, MD;  Location: [facility];  Service: Endoscopy;  Laterality: N/A;    I  have reviewed the social history and family history with the patient and they are unchanged from previous note.  ALLERGIES:  has No Known Allergies.  MEDICATIONS:  Current Outpatient Medications  Medication Sig Dispense Refill  . albuterol (PROVENTIL HFA;VENTOLIN HFA) 108 (90 BASE) MCG/ACT inhaler Inhale 2 puffs into the lungs every 6 (six) hours as needed for wheezing. 1 Inhaler 2  . albuterol (PROVENTIL) (2.5 MG/3ML) 0.083% nebulizer solution Take 3 mLs (2.5 mg total) by nebulization every 2 (two) hours as needed for wheezing. 75 mL 12  . Aspirin-Salicylamide-Caffeine (BC HEADACHE PO) Take 1 packet by mouth 2 (two) times daily as needed (headaches).     Marland Kitchen atorvastatin (LIPITOR) 10 MG tablet Take 1 tablet (10 mg total) by mouth at bedtime. (Patient taking differently: Take 10 mg by mouth every evening. ) 30 tablet 0  . Calcium Carbonate (CALCIUM 500 PO) Take 500 mg by mouth daily.    . Cholecalciferol (VITAMIN D3) 50 MCG (2000 UT) capsule Take 2,000 Units by mouth daily.    Marland Kitchen FLUoxetine (PROZAC) 40 MG capsule Take 40 mg by mouth every evening.     . hydrALAZINE (APRESOLINE) 50 MG tablet Take 1 tablet (50 mg total) by mouth every 8 (eight) hours. 90 tablet 0  . lidocaine-prilocaine (EMLA) cream Apply to affected area as needed weekly 30 g 3  . losartan (COZAAR) 25 MG tablet Take 1 tablet (25 mg total) by mouth daily. 90 tablet 3  . metoprolol succinate (TOPROL-XL) 25 MG 24 hr tablet TAKE 2 TABLETS EVERY DAY. TAKE WITH OR IMMEDIATELY FOLLOWING A MEAL. 180 tablet 1  . nitroGLYCERIN (NITROSTAT) 0.4 MG SL tablet Place 1 tablet (0.4 mg total) under the tongue every 5 (five) minutes as needed for chest pain. 90 tablet 3  . omeprazole (PRILOSEC) 40 MG capsule Take 40 mg by mouth every evening.    . ondansetron (ZOFRAN) 8 MG tablet Take 1 tablet (8 mg total) by mouth 2 (two) times daily as needed (Nausea or vomiting). 30 tablet 1  . Oxycodone HCl 10 MG TABS     . potassium chloride (KLOR-CON) 10 MEQ  tablet Take 10 mEq by mouth every evening.     . prochlorperazine (COMPAZINE) 10 MG tablet TAKE 1 TABLET BY MOUTH EVERY 6 HOURS AS NEEDED (NAUSEA OR VOMITING). 30 tablet 1   No current facility-administered medications for this visit.    PHYSICAL EXAMINATION: ECOG PERFORMANCE STATUS: {CHL ONC ECOG PS:[phone]}  There were no vitals filed for this visit. There were no vitals filed for this visit. *** GENERAL:alert, no distress and comfortable SKIN: skin color, texture, turgor are normal, no rashes or significant lesions EYES: normal, Conjunctiva are pink and non-injected, sclera clear {OROPHARYNX:no exudate, no erythema and lips, buccal mucosa, and tongue normal}  NECK: supple, thyroid normal size, non-tender, without nodularity LYMPH:  no palpable lymphadenopathy in the cervical, axillary {or inguinal} LUNGS: clear to auscultation and percussion with normal breathing effort HEART: regular rate & rhythm and no murmurs and no lower extremity edema ABDOMEN:abdomen soft, non-tender and normal bowel sounds Musculoskeletal:no cyanosis of digits and no clubbing  NEURO: alert & oriented x 3 with fluent speech, no focal motor/sensory deficits  LABORATORY DATA:  I have reviewed the data as listed CBC Latest Ref Rng & Units  01/06/2020 10/15/2019 09/03/2019  WBC 4.0 - 10.5 K/uL 4.5 4.5 3.7(L)  Hemoglobin 12.0 - 15.0 g/dL 12.5 11.0(L) 9.0(L)  Hematocrit 36.0 - 46.0 % 36.6 33.8(L) 27.8(L)  Platelets 150 - 400 K/uL 135(L) 171 124(L)     CMP Latest Ref Rng & Units 01/06/2020 10/15/2019 09/03/2019  Glucose 70 - 99 mg/dL 118(H) 150(H) 91  BUN 8 - 23 mg/dL $Remove'10 11 13  'BciFKci$ Creatinine 0.44 - 1.00 mg/dL 1.26(H) 1.34(H) 1.08(H)  Sodium 135 - 145 mmol/L 136 133(L) 137  Potassium 3.5 - 5.1 mmol/L 3.4(L) 4.0 3.8  Chloride 98 - 111 mmol/L 95(L) 102 103  CO2 22 - 32 mmol/L $RemoveB'29 22 24  'ShUqfXlZ$ Calcium 8.9 - 10.3 mg/dL 9.6 8.6(L) 8.7(L)  Total Protein 6.5 - 8.1 g/dL 7.0 6.1(L) 5.9(L)  Total Bilirubin 0.3 - 1.2 mg/dL 0.4  0.3 0.3  Alkaline Phos 38 - 126 U/L 79 95 102  AST 15 - 41 U/L 15 13(L) 14(L)  ALT 0 - 44 U/L 7 <6 13      RADIOGRAPHIC STUDIES: I have personally reviewed the radiological images as listed and agreed with the findings in the report. No results found.   ASSESSMENT & PLAN:  Allison Vaughan is a 74 y.o. female with    1. Pancreatic neoplasm,cT4N1M0,unresectable -HerEUS/biopsyfrom 07/08/19 showed malignant cells consistent with adenocarcinoma. Due to vascular invasion, she is not a surgical candidatethus her cancer is no longer curable but still treatable to control disease and prolong her life.  -I started her on systemicmoderate intensitychemotherapy with gemcitabine/Abraxane (withsingle agentgemcitabineforcycle 1)on days 1 and 8 q21 daysbeginning 07/23/19. This was Discontinued after 1 cycle of gemcitabine due hospitalized twice for dyspnea, CHF and COPD exacerbation.  -She proceeded with SBRT on 10/26/19-11/05/19. She is currently on observation. Will repeat scan in Spring 2022. ***   2. Abdominal bloating, weight loss, low back pain -Her abdominal bloating/discomfort and weight loss are c/w malignancy, has been gradual over 6-12 months. -She weighed 150-160 lbs before cancer diagnosis. -Her low back pain appears chronic, likely not entirely related to pancreatic mass -She is on long term oxycodone 10 mg q6h, managed by her PCP Dr. Nancy Fetter. Pain is not well controlled lately. I am willing to discuss changing pain management to Palliative Care Dr Hilma Favors with Dr Nancy Fetter.   3. COPD -controlled, not oxygen dependent  -on albuterol PRN  -She has 50 year history of smoking 1-2PPD. She continues to smoke 1ppd although counseled on cessation -Pt notes her breathing adequacy has decreased. She is currently on breathing treatment but not being seen by pulmonologist currently. She can continue to f/u with Dr. Nancy Fetter.   4. Anxiety and depression, Decreased memory, Afib, CAD,  HTN -Chronic, controlled; managed by PCP  -Mood is stable on fluoxetine and ativan at bedtime  -Her daughter notes she has been having decreased shot term memory lately.  -Continue medication, apixaban and atorvastatin  5. Goals of care -she understands because her cancer is not operable it is likely not curable.   6. Pain management  -she has been on oxycodone for chronic back pain, managed by her PCP Dr. Nancy Fetter -she now has intermittent epigastric pain which is likely related to her pancreatic cancer  -if Dr. Nancy Fetter would like [place] to manage her pain, will put her on MS contin $RemoveB'15mg'mJAaEhAU$  at night. She has appointment with Dr. Nancy Fetter this afternoon, and will call me  PLAN: *** -Port flush next week  -Lab, flush, f/u in 2 months  -if her PCP Dr.  Nancy Fetter wants [place] to manage her pain, will add MS Contin $RemoveB'15mg'zVjrEwzt$  at night    No problem-specific Assessment & Plan notes found for this encounter.   No orders of the defined types were placed in this encounter.  All questions were answered. The patient knows to call the clinic with any problems, questions or concerns. No barriers to learning was detected. The total time spent in the appointment was {CHL ONC TIME VISIT - GRMBO:[id number]}.     Joslyn Devon 03/24/2020   Oneal Deputy, am acting as scribe for Truitt Merle, MD.   {Add scribe attestation statement}

## 2020-03-28 ENCOUNTER — Telehealth: Payer: Self-pay | Admitting: Hematology

## 2020-03-28 ENCOUNTER — Inpatient Hospital Stay: Payer: Medicare HMO

## 2020-03-28 ENCOUNTER — Inpatient Hospital Stay: Payer: Medicare HMO | Admitting: Hematology

## 2020-03-28 DIAGNOSIS — C251 Malignant neoplasm of body of pancreas: Secondary | ICD-10-CM

## 2020-03-28 DIAGNOSIS — J42 Unspecified chronic bronchitis: Secondary | ICD-10-CM

## 2020-03-28 NOTE — Telephone Encounter (Signed)
Scheduled per 03/15 scheduled message, patient has been called and voicemail was left with new rescheduled appointment.

## 2020-03-29 ENCOUNTER — Ambulatory Visit: Payer: Medicare HMO | Admitting: Hematology

## 2020-03-29 ENCOUNTER — Other Ambulatory Visit: Payer: Medicare HMO

## 2020-04-04 ENCOUNTER — Telehealth: Payer: Self-pay | Admitting: Hematology

## 2020-04-04 NOTE — Telephone Encounter (Signed)
Rescheduled 03/29 appointment to 03/31, called and left a voicemail.

## 2020-04-10 NOTE — Progress Notes (Incomplete)
Allison Vaughan   [phone] [phone]   Clinic Follow up Note   Patient Care Team: Sandi Mariscal, MD as PCP - General (Internal Medicine) Debara Pickett Nadean Corwin, MD as PCP - Cardiology (Cardiology) Jonnie Finner, RN as Oncology Nurse Navigator Alla Feeling, NP as Nurse Practitioner (Nurse Practitioner) Truitt Merle, MD as Consulting Physician (Hematology) Mansouraty, Telford Nab., MD as Consulting Physician (Gastroenterology)  Date of Service:  04/10/2020  CHIEF COMPLAINT: F/u of pancreatic cancer  SUMMARY OF ONCOLOGIC HISTORY: Oncology History Overview Note  Cancer Staging Primary adenocarcinoma of body of pancreas [facility]) Staging form: Exocrine Pancreas, AJCC 8th Edition - Clinical stage from 07/08/2019: Stage III (cT4, cN1, cM0) - Signed by Truitt Merle, MD on 07/13/2019    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[place])  12/21/2018 Imaging   CT AP w contrast IMPRESSION: 1. Large amount of stool at the level of the rectum. Correlate for symptoms of fecal impaction. 2. Persistent dilatation of the distal pancreatic duct with abrupt cutoff at the level of the pancreatic body. Further evaluation with a nonemergent outpatient contrast enhanced MRI is recommended. 3. Nonobstructive left nephrolithiasis. 4. Subacute anterior right rib fractures.   Aortic Atherosclerosis (ICD10-I70.0).   12/24/2018 Imaging   MRCP IMPRESSION: 1. Suspect a 3.5 cm infiltrating pancreatic body neoplasm causing obstruction of the main pancreatic duct and atrophy of the pancreatic tail. The pancreatic head appears normal. 2. Limited examination due to lack of IV contrast and breathing motion artifact. Difficult to assess the vascular structures for invasion. 3. Recommend endoscopic biopsy. A dedicated high-resolution pancreatic CT scan with contrast may be helpful for further evaluation. 4. No obvious metastatic disease.   12/26/2018 Tumor Marker   CA 19-9: 250     05/05/2019 Procedure   EUS by Dr. Paulita Fujita An irregular mass was identified in the pancreatic body. The mass was hypoechoic. The mass measured 24 mm by 17 mm in maximal cross-sectional diameter. The endosonographic borders were poorlydefined. The remainder of the pancreas was examined. The endosonographic appearance of parenchyma and the upstream pancreatic duct indicated duct dilation. Appeared to be tissue ingrowth into portions of the dilated pancreatic duct. Fine needle aspiration for cytology was performed.  A few abnormal lymph nodes were visualized in the peripancreatic region. The nodes were oval, hypoechoic and had well defined margins. IMPRESSION - A mass was identified in the pancreatic body. This was staged T3 N1 Mx byendosonographic criteria. Fine needle aspiration performed. 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 - A few abnormal lymph nodes were visualized in the peripancreatic region. - Pancreatic parenchymal abnormalities consisting of edema and stranding were noted in the genu of the pancreas and pancreatic body. - There was no evidence of significant pathology.   05/29/2019 Imaging   CT CAP w contrast IMPRESSION: 1. Interval increase in size of poorly defined, hypoenhancing, infiltrating tumor arising from the body and proximal tail of pancreas. 2. Progressive vascular involvement of the portal vein, portal venous confluence, splenic vein, and celiac artery. 3. No specific features of solid organ metastases or metastatic disease to the chest. 4. Similar appearance of tiny, upper lung zone predominant solid and ground-glass attenuating nodules which may be postinflammatory/infectious in etiology. 5. Aortic atherosclerosis, in addition to 3 vessel coronary artery disease. Please note that although the presence of coronary artery calcium documents the presence of coronary artery disease, the severity of this disease and any potential  stenosis cannot be assessed on this non-gated CT examination. Assessment  for potential risk factor modification, dietary therapy or pharmacologic therapy may be warranted, if clinically indicated.   Aortic Atherosclerosis (ICD10-I70.0).   07/08/2019 Procedure   Repeat EUS for biopsy and fiducial placement by Dr. Ardis Hughs   1. Irregularly shaped, heterogeneous, hypoechoic mass was noted in the body of the pancreas, measuring 2.7cm across. The mass clearly invades the portal vein and obstructs the main pancreatic duct causing upstream dilation (5-69mm in the body and tail). Preliminary cytology review was positive for malignancy (adenocarcinoma) and then 4 gold fiducials were placed into the mass using the [employer] placement system. 2. CBD was normal, non-dilated. 3. Limited views of the liver, spleen were normal.      07/08/2019 Pathology Results   FINAL MICROSCOPIC DIAGNOSIS:  - Malignant cells consistent with adenocarcinoma    07/08/2019 Cancer Staging   Staging form: Exocrine Pancreas, AJCC 8th Edition - Clinical stage from 07/08/2019: Stage III (cT4, cN1, cM0) - Signed by Truitt Merle, MD on 07/13/2019   07/12/2019 Initial Diagnosis   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[place])   07/23/2019 - 08/06/2019 Chemotherapy   First-line Gemcitabine and Abraxane 2 weeks on/1 week off starting 07/23/19 with Gemcitabine alone for C1. Discontinued after 1 cycle due hospitalized twice for dyspnea, CHF and COPD exacerbation. I think her tolerance to chemo is very low.   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  The Common Hereditary Gene Panel offered by [facility] includes sequencing and/or deletion duplication testing of the following 56 genes: APC*, ATM*, AXIN2, BARD1, BMPR1A, BRCA1, BRCA2, BRIP1, CASR, CDH1, CDK4, CDKN2A (p14ARF), CDKN2A (p16INK4a),  CFTR*, CHEK2, CPA1, CTNNA1, CTRC, DICER1*, EPCAM*, FANCC, GREM1*, HOXB13, KIT, MEN1*, MLH1*, MSH2*, MSH3*, MSH6*, MUTYH, NBN, NF1*, NTHL1, PALB2, PALLD, PDGFRA, PMS2*, POLD1*, POLE, PRSS1*, PTEN*, RAD50, RAD51C, RAD51D, RNF43, SDHA*, SDHB, SDHC*, SDHD, SMAD4, SMARCA4, SPINK1, STK11, TP53, TSC1*, TSC2, and VHL.  The report date is September 21, 2019.   10/26/2019 - 11/05/2019 Radiation Therapy   SBRT with Dr Lisbeth Renshaw    01/06/2020 Imaging   CT CAP  IMPRESSION: 1. Diminished soft tissue thickening and ductal dilatation of the pancreatic body and tail about biopsy marking clips. Findings are consistent with treatment response of pancreatic malignancy. 2. Redemonstrated soft tissue thickening with improved narrowing about the central portal vein, with persistent effacement of the splenic vein, consistent with treatment response. 3. No evidence of lymphadenopathy or metastatic disease within the chest, abdomen, or pelvis. 4. Innumerable tiny centrilobular ground-glass nodules throughout the lungs, most concentrated at the apices, nonspecific and infectious or inflammatory, but most commonly seen in smoking-related respiratory bronchiolitis. Metastatic disease not favored. 5. Diffuse bilateral bronchial wall thickening with diffuse bilateral bronchial wall thickening, consistent with nonspecific infectious or inflammatory bronchitis. 6. Nonobstructive right nephrolithiasis versus vascular calcification. 7. Coronary artery disease.   Aortic Atherosclerosis (ICD10-I70.0).      CURRENT THERAPY:  Observation   INTERVAL HISTORY: *** Allison Vaughan is here for a follow up. She was last seen by me 2 months ago. She presents to the clinic alone.    REVIEW OF SYSTEMS:  *** Constitutional: Denies fevers, chills or abnormal weight loss Eyes: Denies blurriness of vision Ears, nose, mouth, throat, and face: Denies mucositis or sore throat Respiratory: Denies cough, dyspnea or  wheezes Cardiovascular: Denies palpitation, chest discomfort or lower extremity swelling Gastrointestinal:  Denies nausea, heartburn or change in  bowel habits Skin: Denies abnormal skin rashes Lymphatics: Denies new lymphadenopathy or easy bruising Neurological:Denies numbness, tingling or new weaknesses Behavioral/Psych: Mood is stable, no new changes  All other systems were reviewed with the patient and are negative.  MEDICAL HISTORY:  Past Medical History:  Diagnosis Date  . Accidental drug overdose   . Acute encephalopathy   . Acute on chronic respiratory failure with hypoxia ([place]) 03/01/2017  . Acute renal failure ([place]) 04/16/2014  . AKI (acute kidney injury) ([place]) 04/30/2014  . Altered mental status 06/27/2012  . Anxiety   . Atrial fibrillation ([place])   . CAD (coronary artery disease)   . CAP (community acquired pneumonia) 04/18/2014  . Cardiomyopathy ([place])   . Chronic hypoxemic respiratory failure ([place]) 06/26/2012  . Chronic pain   . COPD exacerbation ([place]) 06/26/2012  . COPD with acute exacerbation ([place]) 03/01/2017  . DEPRESSIVE DISORDER, NOS 03/13/2006   Qualifier: Diagnosis of  By: Damita Dunnings MD, Phillip Heal    . Edema, peripheral   . Essential hypertension 04/30/2014  . Fall at home, initial encounter 03/01/2017  . GASTROESOPHAGEAL REFLUX, NO ESOPHAGITIS 03/13/2006   Qualifier: Diagnosis of  By: Damita Dunnings MD, Phillip Heal    . HYPERCHOLESTEROLEMIA 03/13/2006   Qualifier: Diagnosis of  By: Damita Dunnings MD, Phillip Heal    . Hyperlipidemia   . HYPERTENSION, BENIGN SYSTEMIC 03/13/2006   Qualifier: Diagnosis of  By: Damita Dunnings MD, Phillip Heal    . Hypokalemia 04/30/2014  . Hyponatremia 04/30/2014  . Hypotension 04/30/2014  . Hypoxemia 06/26/2012  . Hypoxia 03/26/2018  . Irritable bowel syndrome 03/13/2006   Qualifier: Diagnosis of  By: Damita Dunnings MD, Phillip Heal    . Lung nodule 04/16/2014  . MENOPAUSAL SYNDROME 03/13/2006   Qualifier: Diagnosis of  By: Damita Dunnings MD, Phillip Heal    . Mood disorder [facility]) 03/13/2006   Qualifier: Diagnosis of   By: Damita Dunnings MD, Phillip Heal    . 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 Nausea 04/30/2014  . Nausea vomiting and diarrhea 04/16/2014  . OSTEOARTHRITIS, MULTI SITES 03/13/2006   Qualifier: Diagnosis of  By: Damita Dunnings MD, Phillip Heal    . Pulmonary nodule 04/30/2014  . Respiratory failure ([place]) 01/22/2017  . TOBACCO DEPENDENCE 03/13/2006   Qualifier: Diagnosis of  By: Damita Dunnings MD, Phillip Heal    . Weight loss     SURGICAL HISTORY: Past Surgical History:  Procedure Laterality Date  . ABDOMINAL HYSTERECTOMY    . ESOPHAGOGASTRODUODENOSCOPY N/A 07/08/2019   Procedure: ESOPHAGOGASTRODUODENOSCOPY (EGD);  Surgeon: Milus Banister, MD;  Location: [facility];  Service: Endoscopy;  Laterality: N/A;  . ESOPHAGOGASTRODUODENOSCOPY (EGD) WITH PROPOFOL N/A 05/05/2019   Procedure: ESOPHAGOGASTRODUODENOSCOPY (EGD) WITH PROPOFOL;  Surgeon: Arta Silence, MD;  Location: [facility];  Service: Endoscopy;  Laterality: N/A;  . EUS N/A 07/08/2019   Procedure: UPPER ENDOSCOPIC ULTRASOUND (EUS) RADIAL;  Surgeon: Milus Banister, MD;  Location: [facility];  Service: Endoscopy;  Laterality: N/A;  . EYE SURGERY    . FIDUCIAL MARKER PLACEMENT N/A 07/08/2019   Procedure: FIDUCIAL MARKER PLACEMENT;  Surgeon: Milus Banister, MD;  Location: [facility];  Service: Endoscopy;  Laterality: N/A;  . FINE NEEDLE ASPIRATION N/A 05/05/2019   Procedure: FINE NEEDLE ASPIRATION (FNA) LINEAR;  Surgeon: Arta Silence, MD;  Location: [facility];  Service: Endoscopy;  Laterality: N/A;  . FINE NEEDLE ASPIRATION N/A 07/08/2019   Procedure: FINE NEEDLE ASPIRATION (FNA) LINEAR;  Surgeon: Milus Banister, MD;  Location: [facility];  Service: Endoscopy;  Laterality: N/A;  . IR IMAGING GUIDED PORT INSERTION  07/27/2019  . ROTATOR  CUFF REPAIR Right 2012  . UPPER ESOPHAGEAL ENDOSCOPIC ULTRASOUND (EUS) N/A 05/05/2019   Procedure: UPPER ESOPHAGEAL ENDOSCOPIC ULTRASOUND (EUS) with FNA;  Surgeon: Arta Silence, MD;  Location: [facility];  Service: Endoscopy;   Laterality: N/A;    I have reviewed the social history and family history with the patient and they are unchanged from previous note.  ALLERGIES:  has No Known Allergies.  MEDICATIONS:  Current Outpatient Medications  Medication Sig Dispense Refill  . albuterol (PROVENTIL HFA;VENTOLIN HFA) 108 (90 BASE) MCG/ACT inhaler Inhale 2 puffs into the lungs every 6 (six) hours as needed for wheezing. 1 Inhaler 2  . albuterol (PROVENTIL) (2.5 MG/3ML) 0.083% nebulizer solution Take 3 mLs (2.5 mg total) by nebulization every 2 (two) hours as needed for wheezing. 75 mL 12  . Aspirin-Salicylamide-Caffeine (BC HEADACHE PO) Take 1 packet by mouth 2 (two) times daily as needed (headaches).     Marland Kitchen atorvastatin (LIPITOR) 10 MG tablet Take 1 tablet (10 mg total) by mouth at bedtime. (Patient taking differently: Take 10 mg by mouth every evening. ) 30 tablet 0  . Calcium Carbonate (CALCIUM 500 PO) Take 500 mg by mouth daily.    . Cholecalciferol (VITAMIN D3) 50 MCG (2000 UT) capsule Take 2,000 Units by mouth daily.    Marland Kitchen FLUoxetine (PROZAC) 40 MG capsule Take 40 mg by mouth every evening.     . hydrALAZINE (APRESOLINE) 50 MG tablet Take 1 tablet (50 mg total) by mouth every 8 (eight) hours. 90 tablet 0  . lidocaine-prilocaine (EMLA) cream Apply to affected area as needed weekly 30 g 3  . losartan (COZAAR) 25 MG tablet Take 1 tablet (25 mg total) by mouth daily. 90 tablet 3  . metoprolol succinate (TOPROL-XL) 25 MG 24 hr tablet TAKE 2 TABLETS EVERY DAY. TAKE WITH OR IMMEDIATELY FOLLOWING A MEAL. 180 tablet 1  . nitroGLYCERIN (NITROSTAT) 0.4 MG SL tablet Place 1 tablet (0.4 mg total) under the tongue every 5 (five) minutes as needed for chest pain. 90 tablet 3  . omeprazole (PRILOSEC) 40 MG capsule Take 40 mg by mouth every evening.    . ondansetron (ZOFRAN) 8 MG tablet Take 1 tablet (8 mg total) by mouth 2 (two) times daily as needed (Nausea or vomiting). 30 tablet 1  . Oxycodone HCl 10 MG TABS     . potassium  chloride (KLOR-CON) 10 MEQ tablet Take 10 mEq by mouth every evening.     . prochlorperazine (COMPAZINE) 10 MG tablet TAKE 1 TABLET BY MOUTH EVERY 6 HOURS AS NEEDED (NAUSEA OR VOMITING). 30 tablet 1   No current facility-administered medications for this visit.    PHYSICAL EXAMINATION: ECOG PERFORMANCE STATUS: {CHL ONC ECOG PS:[phone]}  There were no vitals filed for this visit. There were no vitals filed for this visit. *** GENERAL:alert, no distress and comfortable SKIN: skin color, texture, turgor are normal, no rashes or significant lesions EYES: normal, Conjunctiva are pink and non-injected, sclera clear {OROPHARYNX:no exudate, no erythema and lips, buccal mucosa, and tongue normal}  NECK: supple, thyroid normal size, non-tender, without nodularity LYMPH:  no palpable lymphadenopathy in the cervical, axillary {or inguinal} LUNGS: clear to auscultation and percussion with normal breathing effort HEART: regular rate & rhythm and no murmurs and no lower extremity edema ABDOMEN:abdomen soft, non-tender and normal bowel sounds Musculoskeletal:no cyanosis of digits and no clubbing  NEURO: alert & oriented x 3 with fluent speech, no focal motor/sensory deficits  LABORATORY DATA:  I have reviewed the data as listed  CBC Latest Ref Rng & Units 01/06/2020 10/15/2019 09/03/2019  WBC 4.0 - 10.5 K/uL 4.5 4.5 3.7(L)  Hemoglobin 12.0 - 15.0 g/dL 12.5 11.0(L) 9.0(L)  Hematocrit 36.0 - 46.0 % 36.6 33.8(L) 27.8(L)  Platelets 150 - 400 K/uL 135(L) 171 124(L)     CMP Latest Ref Rng & Units 01/06/2020 10/15/2019 09/03/2019  Glucose 70 - 99 mg/dL 118(H) 150(H) 91  BUN 8 - 23 mg/dL $Remove'10 11 13  'PHpePuC$ Creatinine 0.44 - 1.00 mg/dL 1.26(H) 1.34(H) 1.08(H)  Sodium 135 - 145 mmol/L 136 133(L) 137  Potassium 3.5 - 5.1 mmol/L 3.4(L) 4.0 3.8  Chloride 98 - 111 mmol/L 95(L) 102 103  CO2 22 - 32 mmol/L $RemoveB'29 22 24  'JAvcZKyG$ Calcium 8.9 - 10.3 mg/dL 9.6 8.6(L) 8.7(L)  Total Protein 6.5 - 8.1 g/dL 7.0 6.1(L) 5.9(L)  Total  Bilirubin 0.3 - 1.2 mg/dL 0.4 0.3 0.3  Alkaline Phos 38 - 126 U/L 79 95 102  AST 15 - 41 U/L 15 13(L) 14(L)  ALT 0 - 44 U/L 7 <6 13      RADIOGRAPHIC STUDIES: I have personally reviewed the radiological images as listed and agreed with the findings in the report. No results found.   ASSESSMENT & PLAN:  Allison Vaughan is a 75 y.o. female with    1. Pancreatic neoplasm,cT4N1M0,unresectable -Sheinitiallypresented with CA 19-9 is elevated to 250. 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  -HerEUS/biopsyfrom 07/08/19 showed malignant cells consistent with adenocarcinoma. Due to vascular invasion, she is not a surgical candidatethus her cancer is no longer curable but still treatable to control disease and prolong her life.  -I started her on systemicmoderate intensitychemotherapy with gemcitabine/Abraxane(with single agent gemcitabine for cycle 1)on days 1 and 8 q21 daysbeginning 07/23/19. This was Discontinued after 1 cycle of gemcitabine due hospitalized twice for dyspnea, CHF and COPD exacerbation.  -She proceeded with SBRT on 10/26/19-11/05/19.  -Her 01/06/20 CT scan showed partial response to treatment, no evidence of new metastasis. Given prior poor toleration of chemo, her medical comorbilities and memory loss, I do not think she can tolerate chemo in future. She is on observation for now. Will repeat scan in 4 months. ***    2. Abdominal bloating, weight loss, low back pain -Her abdominal bloating/discomfort and weight loss are c/w malignancy, has been gradual over 6-12 months. -She weighed 150-160 lbs before cancer diagnosis. -Her low back pain appears chronic, likely not entirely related to pancreatic mass -She is on long term oxycodone 10 mg q6h, managed by her PCP Dr. Nancy Fetter. Pain is not well controlled lately. I am willing to  discuss changing pain management to Palliative Care Dr Hilma Favors with Dr Nancy Fetter.   3. COPD -controlled, not oxygen dependent  -on albuterol PRN  -She has 50 year history of smoking 1-2PPD. She continues to smoke 1ppd although counseled on cessation -Pt notes her breathing adequacy has decreased. She is currently on breathing treatment but not being seen by pulmonologist currently. She can continue to f/u with Dr. Nancy Fetter.   4. Anxiety and depression, Decreased memory, Afib, CAD, HTN -Mood is stable on fluoxetine and ativan at bedtime  -Her daughter notes she has been having decreased shot term memory lately.  -She is also on apixaban and atorvastatin. Continue to F/u with PCP   5. Goals of care -she understands because her cancer is not operable it is likely not curable.   6. Pain management  -  she has been on oxycodone for chronic back pain, managed by her PCP Dr. Nancy Fetter -she now has intermittent epigastric pain which is likely related to her pancreatic cancer  -If Dr. Nancy Fetter would like [place] to manage her pain, will put her on MS contin $RemoveB'15mg'OPylvpPJ$  at night.  PLAN: ***    No problem-specific Assessment & Plan notes found for this encounter.   No orders of the defined types were placed in this encounter.  All questions were answered. The patient knows to call the clinic with any problems, questions or concerns. No barriers to learning was detected. The total time spent in the appointment was {CHL ONC TIME VISIT - LSLHT:[id number]}.     Joslyn Devon 04/10/2020   Oneal Deputy, am acting as scribe for Truitt Merle, MD.   {Add scribe attestation statement}

## 2020-04-11 ENCOUNTER — Inpatient Hospital Stay: Payer: Medicare HMO | Admitting: Hematology

## 2020-04-11 ENCOUNTER — Inpatient Hospital Stay: Payer: Medicare HMO

## 2020-04-12 NOTE — Progress Notes (Incomplete)
[place]   [phone]) [phone] [phone]) [phone]   Clinic Follow up Note   Patient Care Team: Sandi Mariscal, MD as PCP - General (Internal Medicine) Debara Pickett Nadean Corwin, MD as PCP - Cardiology (Cardiology) Jonnie Finner, RN as Oncology Nurse Navigator Alla Feeling, NP as Nurse Practitioner (Nurse Practitioner) Truitt Merle, MD as Consulting Physician (Hematology) Mansouraty, Telford Nab., MD as Consulting Physician (Gastroenterology)  Date of Service:  04/12/2020  CHIEF COMPLAINT: F/u of pancreatic cancer  SUMMARY OF ONCOLOGIC HISTORY: Oncology History Overview Note  Cancer Staging Primary adenocarcinoma of body of pancreas [facility]) Staging form: Exocrine Pancreas, AJCC 8th Edition - Clinical stage from 07/08/2019: Stage III (cT4, cN1, cM0) - Signed by Truitt Merle, MD on 07/13/2019    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[name])  12/21/2018 Imaging   CT AP w contrast IMPRESSION: 1. Large amount of stool at the level of the rectum. Correlate for symptoms of fecal impaction. 2. Persistent dilatation of the distal pancreatic duct with abrupt cutoff at the level of the pancreatic body. Further evaluation with a nonemergent outpatient contrast enhanced MRI is recommended. 3. Nonobstructive left nephrolithiasis. 4. Subacute anterior right rib fractures.   Aortic Atherosclerosis (ICD10-I70.0).   12/24/2018 Imaging   MRCP IMPRESSION: 1. Suspect a 3.5 cm infiltrating pancreatic body neoplasm causing obstruction of the main pancreatic duct and atrophy of the pancreatic tail. The pancreatic head appears normal. 2. Limited examination due to lack of IV contrast and breathing motion artifact. Difficult to assess the vascular structures for invasion. 3. Recommend endoscopic biopsy. A dedicated high-resolution pancreatic CT scan with contrast may be helpful for further evaluation. 4. No obvious metastatic disease.   12/26/2018 Tumor Marker   CA 19-9: 250     05/05/2019 Procedure   EUS by Dr. Paulita Fujita An irregular mass was identified in the pancreatic body. The mass was hypoechoic. The mass measured 24 mm by 17 mm in maximal cross-sectional diameter. The endosonographic borders were poorlydefined. The remainder of the pancreas was examined. The endosonographic appearance of parenchyma and the upstream pancreatic duct indicated duct dilation. Appeared to be tissue ingrowth into portions of the dilated pancreatic duct. Fine needle aspiration for cytology was performed.  A few abnormal lymph nodes were visualized in the peripancreatic region. The nodes were oval, hypoechoic and had well defined margins. IMPRESSION - A mass was identified in the pancreatic body. This was staged T3 N1 Mx byendosonographic criteria. Fine needle aspiration performed. 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 - A few abnormal lymph nodes were visualized in the peripancreatic region. - Pancreatic parenchymal abnormalities consisting of edema and stranding were noted in the genu of the pancreas and pancreatic body. - There was no evidence of significant pathology.   05/29/2019 Imaging   CT CAP w contrast IMPRESSION: 1. Interval increase in size of poorly defined, hypoenhancing, infiltrating tumor arising from the body and proximal tail of pancreas. 2. Progressive vascular involvement of the portal vein, portal venous confluence, splenic vein, and celiac artery. 3. No specific features of solid organ metastases or metastatic disease to the chest. 4. Similar appearance of tiny, upper lung zone predominant solid and ground-glass attenuating nodules which may be postinflammatory/infectious in etiology. 5. Aortic atherosclerosis, in addition to 3 vessel coronary artery disease. Please note that although the presence of coronary artery calcium documents the presence of coronary artery disease, the severity of this disease and any potential  stenosis cannot be assessed on this non-gated CT examination. Assessment  for potential risk factor modification, dietary therapy or pharmacologic therapy may be warranted, if clinically indicated.   Aortic Atherosclerosis (ICD10-I70.0).   07/08/2019 Procedure   Repeat EUS for biopsy and fiducial placement by Dr. Christella Hartigan   1. Irregularly shaped, heterogeneous, hypoechoic mass was noted in the body of the pancreas, measuring 2.7cm across. The mass clearly invades the portal vein and obstructs the main pancreatic duct causing upstream dilation (5-16mm in the body and tail). Preliminary cytology review was positive for malignancy (adenocarcinoma) and then 4 gold fiducials were placed into the mass using the [employer] placement system. 2. CBD was normal, non-dilated. 3. Limited views of the liver, spleen were normal.      07/08/2019 Pathology Results   FINAL MICROSCOPIC DIAGNOSIS:  - Malignant cells consistent with adenocarcinoma    07/08/2019 Cancer Staging   Staging form: Exocrine Pancreas, AJCC 8th Edition - Clinical stage from 07/08/2019: Stage III (cT4, cN1, cM0) - Signed by Malachy Mood, MD on 07/13/2019   07/12/2019 Initial Diagnosis   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HCC)   07/23/2019 - 08/06/2019 Chemotherapy   First-line Gemcitabine and Abraxane 2 weeks on/1 week off starting 07/23/19 with Gemcitabine alone for C1. Discontinued after 1 cycle due hospitalized twice for dyspnea, CHF and COPD exacerbation. I think her tolerance to chemo is very low.   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  The Common Hereditary Gene Panel offered by [facility] includes sequencing and/or deletion duplication testing of the following 56 genes: APC*, ATM*, AXIN2, BARD1, BMPR1A, BRCA1, BRCA2, BRIP1, CASR, CDH1, CDK4, CDKN2A (p14ARF), CDKN2A (p16INK4a),  CFTR*, CHEK2, CPA1, CTNNA1, CTRC, DICER1*, EPCAM*, FANCC, GREM1*, HOXB13, KIT, MEN1*, MLH1*, MSH2*, MSH3*, MSH6*, MUTYH, NBN, NF1*, NTHL1, PALB2, PALLD, PDGFRA, PMS2*, POLD1*, POLE, PRSS1*, PTEN*, RAD50, RAD51C, RAD51D, RNF43, SDHA*, SDHB, SDHC*, SDHD, SMAD4, SMARCA4, SPINK1, STK11, TP53, TSC1*, TSC2, and VHL.  The report date is September 21, 2019.   10/26/2019 - 11/05/2019 Radiation Therapy   SBRT with Dr Mitzi Hansen    01/06/2020 Imaging   CT CAP  IMPRESSION: 1. Diminished soft tissue thickening and ductal dilatation of the pancreatic body and tail about biopsy marking clips. Findings are consistent with treatment response of pancreatic malignancy. 2. Redemonstrated soft tissue thickening with improved narrowing about the central portal vein, with persistent effacement of the splenic vein, consistent with treatment response. 3. No evidence of lymphadenopathy or metastatic disease within the chest, abdomen, or pelvis. 4. Innumerable tiny centrilobular ground-glass nodules throughout the lungs, most concentrated at the apices, nonspecific and infectious or inflammatory, but most commonly seen in smoking-related respiratory bronchiolitis. Metastatic disease not favored. 5. Diffuse bilateral bronchial wall thickening with diffuse bilateral bronchial wall thickening, consistent with nonspecific infectious or inflammatory bronchitis. 6. Nonobstructive right nephrolithiasis versus vascular calcification. 7. Coronary artery disease.   Aortic Atherosclerosis (ICD10-I70.0).      CURRENT THERAPY:  Observation   INTERVAL HISTORY: *** DEVERA ENGLANDER is here for a follow up. She was last seen by me 2 months ago. She presents to the clinic alone.    REVIEW OF SYSTEMS:  *** Constitutional: Denies fevers, chills or abnormal weight loss Eyes: Denies blurriness of vision Ears, nose, mouth, throat, and face: Denies mucositis or sore throat Respiratory: Denies cough, dyspnea or  wheezes Cardiovascular: Denies palpitation, chest discomfort or lower extremity swelling Gastrointestinal:  Denies nausea, heartburn or change in  bowel habits Skin: Denies abnormal skin rashes Lymphatics: Denies new lymphadenopathy or easy bruising Neurological:Denies numbness, tingling or new weaknesses Behavioral/Psych: Mood is stable, no new changes  All other systems were reviewed with the patient and are negative.  MEDICAL HISTORY:  Past Medical History:  Diagnosis Date  . Accidental drug overdose   . Acute encephalopathy   . Acute on chronic respiratory failure with hypoxia ([place]) 03/01/2017  . Acute renal failure ([place]) 04/16/2014  . AKI (acute kidney injury) ([place]) 04/30/2014  . Altered mental status 06/27/2012  . Anxiety   . Atrial fibrillation ([place])   . CAD (coronary artery disease)   . CAP (community acquired pneumonia) 04/18/2014  . Cardiomyopathy ([place])   . Chronic hypoxemic respiratory failure ([place]) 06/26/2012  . Chronic pain   . COPD exacerbation ([place]) 06/26/2012  . COPD with acute exacerbation ([place]) 03/01/2017  . DEPRESSIVE DISORDER, NOS 03/13/2006   Qualifier: Diagnosis of  By: Damita Dunnings MD, Phillip Heal    . Edema, peripheral   . Essential hypertension 04/30/2014  . Fall at home, initial encounter 03/01/2017  . GASTROESOPHAGEAL REFLUX, NO ESOPHAGITIS 03/13/2006   Qualifier: Diagnosis of  By: Damita Dunnings MD, Phillip Heal    . HYPERCHOLESTEROLEMIA 03/13/2006   Qualifier: Diagnosis of  By: Damita Dunnings MD, Phillip Heal    . Hyperlipidemia   . HYPERTENSION, BENIGN SYSTEMIC 03/13/2006   Qualifier: Diagnosis of  By: Damita Dunnings MD, Phillip Heal    . Hypokalemia 04/30/2014  . Hyponatremia 04/30/2014  . Hypotension 04/30/2014  . Hypoxemia 06/26/2012  . Hypoxia 03/26/2018  . Irritable bowel syndrome 03/13/2006   Qualifier: Diagnosis of  By: Damita Dunnings MD, Phillip Heal    . Lung nodule 04/16/2014  . MENOPAUSAL SYNDROME 03/13/2006   Qualifier: Diagnosis of  By: Damita Dunnings MD, Phillip Heal    . Mood disorder [facility]) 03/13/2006   Qualifier: Diagnosis of   By: Damita Dunnings MD, Phillip Heal    . 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 Nausea 04/30/2014  . Nausea vomiting and diarrhea 04/16/2014  . OSTEOARTHRITIS, MULTI SITES 03/13/2006   Qualifier: Diagnosis of  By: Damita Dunnings MD, Phillip Heal    . Pulmonary nodule 04/30/2014  . Respiratory failure ([place]) 01/22/2017  . TOBACCO DEPENDENCE 03/13/2006   Qualifier: Diagnosis of  By: Damita Dunnings MD, Phillip Heal    . Weight loss     SURGICAL HISTORY: Past Surgical History:  Procedure Laterality Date  . ABDOMINAL HYSTERECTOMY    . ESOPHAGOGASTRODUODENOSCOPY N/A 07/08/2019   Procedure: ESOPHAGOGASTRODUODENOSCOPY (EGD);  Surgeon: Milus Banister, MD;  Location: [facility];  Service: Endoscopy;  Laterality: N/A;  . ESOPHAGOGASTRODUODENOSCOPY (EGD) WITH PROPOFOL N/A 05/05/2019   Procedure: ESOPHAGOGASTRODUODENOSCOPY (EGD) WITH PROPOFOL;  Surgeon: Arta Silence, MD;  Location: [facility];  Service: Endoscopy;  Laterality: N/A;  . EUS N/A 07/08/2019   Procedure: UPPER ENDOSCOPIC ULTRASOUND (EUS) RADIAL;  Surgeon: Milus Banister, MD;  Location: [facility];  Service: Endoscopy;  Laterality: N/A;  . EYE SURGERY    . FIDUCIAL MARKER PLACEMENT N/A 07/08/2019   Procedure: FIDUCIAL MARKER PLACEMENT;  Surgeon: Milus Banister, MD;  Location: [facility];  Service: Endoscopy;  Laterality: N/A;  . FINE NEEDLE ASPIRATION N/A 05/05/2019   Procedure: FINE NEEDLE ASPIRATION (FNA) LINEAR;  Surgeon: Arta Silence, MD;  Location: [facility];  Service: Endoscopy;  Laterality: N/A;  . FINE NEEDLE ASPIRATION N/A 07/08/2019   Procedure: FINE NEEDLE ASPIRATION (FNA) LINEAR;  Surgeon: Milus Banister, MD;  Location: [facility];  Service: Endoscopy;  Laterality: N/A;  . IR IMAGING GUIDED PORT INSERTION  07/27/2019  . ROTATOR  CUFF REPAIR Right 2012  . UPPER ESOPHAGEAL ENDOSCOPIC ULTRASOUND (EUS) N/A 05/05/2019   Procedure: UPPER ESOPHAGEAL ENDOSCOPIC ULTRASOUND (EUS) with FNA;  Surgeon: Arta Silence, MD;  Location: [facility];  Service: Endoscopy;   Laterality: N/A;    I have reviewed the social history and family history with the patient and they are unchanged from previous note.  ALLERGIES:  has No Known Allergies.  MEDICATIONS:  Current Outpatient Medications  Medication Sig Dispense Refill  . albuterol (PROVENTIL HFA;VENTOLIN HFA) 108 (90 BASE) MCG/ACT inhaler Inhale 2 puffs into the lungs every 6 (six) hours as needed for wheezing. 1 Inhaler 2  . albuterol (PROVENTIL) (2.5 MG/3ML) 0.083% nebulizer solution Take 3 mLs (2.5 mg total) by nebulization every 2 (two) hours as needed for wheezing. 75 mL 12  . Aspirin-Salicylamide-Caffeine (BC HEADACHE PO) Take 1 packet by mouth 2 (two) times daily as needed (headaches).     Marland Kitchen atorvastatin (LIPITOR) 10 MG tablet Take 1 tablet (10 mg total) by mouth at bedtime. (Patient taking differently: Take 10 mg by mouth every evening. ) 30 tablet 0  . Calcium Carbonate (CALCIUM 500 PO) Take 500 mg by mouth daily.    . Cholecalciferol (VITAMIN D3) 50 MCG (2000 UT) capsule Take 2,000 Units by mouth daily.    Marland Kitchen FLUoxetine (PROZAC) 40 MG capsule Take 40 mg by mouth every evening.     . hydrALAZINE (APRESOLINE) 50 MG tablet Take 1 tablet (50 mg total) by mouth every 8 (eight) hours. 90 tablet 0  . lidocaine-prilocaine (EMLA) cream Apply to affected area as needed weekly 30 g 3  . losartan (COZAAR) 25 MG tablet Take 1 tablet (25 mg total) by mouth daily. 90 tablet 3  . metoprolol succinate (TOPROL-XL) 25 MG 24 hr tablet TAKE 2 TABLETS EVERY DAY. TAKE WITH OR IMMEDIATELY FOLLOWING A MEAL. 180 tablet 1  . nitroGLYCERIN (NITROSTAT) 0.4 MG SL tablet Place 1 tablet (0.4 mg total) under the tongue every 5 (five) minutes as needed for chest pain. 90 tablet 3  . omeprazole (PRILOSEC) 40 MG capsule Take 40 mg by mouth every evening.    . ondansetron (ZOFRAN) 8 MG tablet Take 1 tablet (8 mg total) by mouth 2 (two) times daily as needed (Nausea or vomiting). 30 tablet 1  . Oxycodone HCl 10 MG TABS     . potassium  chloride (KLOR-CON) 10 MEQ tablet Take 10 mEq by mouth every evening.     . prochlorperazine (COMPAZINE) 10 MG tablet TAKE 1 TABLET BY MOUTH EVERY 6 HOURS AS NEEDED (NAUSEA OR VOMITING). 30 tablet 1   No current facility-administered medications for this visit.    PHYSICAL EXAMINATION: ECOG PERFORMANCE STATUS: {CHL ONC ECOG PS:[phone]}  There were no vitals filed for this visit. There were no vitals filed for this visit. *** GENERAL:alert, no distress and comfortable SKIN: skin color, texture, turgor are normal, no rashes or significant lesions EYES: normal, Conjunctiva are pink and non-injected, sclera clear {OROPHARYNX:no exudate, no erythema and lips, buccal mucosa, and tongue normal}  NECK: supple, thyroid normal size, non-tender, without nodularity LYMPH:  no palpable lymphadenopathy in the cervical, axillary {or inguinal} LUNGS: clear to auscultation and percussion with normal breathing effort HEART: regular rate & rhythm and no murmurs and no lower extremity edema ABDOMEN:abdomen soft, non-tender and normal bowel sounds Musculoskeletal:no cyanosis of digits and no clubbing  NEURO: alert & oriented x 3 with fluent speech, no focal motor/sensory deficits  LABORATORY DATA:  I have reviewed the data as listed  CBC Latest Ref Rng & Units 01/06/2020 10/15/2019 09/03/2019  WBC 4.0 - 10.5 K/uL 4.5 4.5 3.7(L)  Hemoglobin 12.0 - 15.0 g/dL 12.5 11.0(L) 9.0(L)  Hematocrit 36.0 - 46.0 % 36.6 33.8(L) 27.8(L)  Platelets 150 - 400 K/uL 135(L) 171 124(L)     CMP Latest Ref Rng & Units 01/06/2020 10/15/2019 09/03/2019  Glucose 70 - 99 mg/dL 118(H) 150(H) 91  BUN 8 - 23 mg/dL $Remove'10 11 13  'TORdiPy$ Creatinine 0.44 - 1.00 mg/dL 1.26(H) 1.34(H) 1.08(H)  Sodium 135 - 145 mmol/L 136 133(L) 137  Potassium 3.5 - 5.1 mmol/L 3.4(L) 4.0 3.8  Chloride 98 - 111 mmol/L 95(L) 102 103  CO2 22 - 32 mmol/L $RemoveB'29 22 24  'PVAaOTQZ$ Calcium 8.9 - 10.3 mg/dL 9.6 8.6(L) 8.7(L)  Total Protein 6.5 - 8.1 g/dL 7.0 6.1(L) 5.9(L)  Total  Bilirubin 0.3 - 1.2 mg/dL 0.4 0.3 0.3  Alkaline Phos 38 - 126 U/L 79 95 102  AST 15 - 41 U/L 15 13(L) 14(L)  ALT 0 - 44 U/L 7 <6 13      RADIOGRAPHIC STUDIES: I have personally reviewed the radiological images as listed and agreed with the findings in the report. No results found.   ASSESSMENT & PLAN:  Allison Vaughan is a 75 y.o. female with    1. Pancreatic neoplasm,cT4N1M0,unresectable -Sheinitiallypresented with CA 19-9 is elevated to 250. 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  -HerEUS/biopsyfrom 07/08/19 showed malignant cells consistent with adenocarcinoma. Due to vascular invasion, she is not a surgical candidatethus her cancer is no longer curable but still treatable to control disease and prolong her life.  -I started her on systemicmoderate intensitychemotherapy with gemcitabine/Abraxane(with single agent gemcitabine for cycle 1)on days 1 and 8 q21 daysbeginning 07/23/19. This was Discontinued after 1 cycle of gemcitabine due hospitalized twice for dyspnea, CHF and COPD exacerbation.  -She proceeded with SBRT on 10/26/19-11/05/19.  -Her 01/06/20 CT scan showed partial response to treatment, no evidence of new metastasis. Given prior poor toleration of chemo, her medical comorbilities and memory loss, I do not think she can tolerate chemo in future. She is on observation for now. Will repeat scan in 4 months. ***    2. Abdominal bloating, weight loss, low back pain -Her abdominal bloating/discomfort and weight loss are c/w malignancy, has been gradual over 6-12 months. -She weighed 150-160 lbs before cancer diagnosis. -Her low back pain appears chronic, likely not entirely related to pancreatic mass -She is on long term oxycodone 10 mg q6h, managed by her PCP Dr. Nancy Fetter. Pain is not well controlled lately. I am willing to  discuss changing pain management to Palliative Care Dr Hilma Favors with Dr Nancy Fetter.   3. COPD -controlled, not oxygen dependent  -on albuterol PRN  -She has 50 year history of smoking 1-2PPD. She continues to smoke 1ppd although counseled on cessation -Pt notes her breathing adequacy has decreased. She is currently on breathing treatment but not being seen by pulmonologist currently. She can continue to f/u with Dr. Nancy Fetter.   4. Anxiety and depression, Decreased memory, Afib, CAD, HTN -Mood is stable on fluoxetine and ativan at bedtime  -Her daughter notes she has been having decreased shot term memory lately.  -She is also on apixaban and atorvastatin. Continue to F/u with PCP   5. Goals of care -she understands because her cancer is not operable it is likely not curable.   6. Pain management  -  she has been on oxycodone for chronic back pain, managed by her PCP Dr. Nancy Fetter -she now has intermittent epigastric pain which is likely related to her pancreatic cancer  -If Dr. Nancy Fetter would like [place] to manage her pain, will put her on MS contin $RemoveB'15mg'QgtcDeEk$  at night.  PLAN: ***    No problem-specific Assessment & Plan notes found for this encounter.   No orders of the defined types were placed in this encounter.  All questions were answered. The patient knows to call the clinic with any problems, questions or concerns. No barriers to learning was detected. The total time spent in the appointment was {CHL ONC TIME VISIT - YNWGN:[id number]}.     Aurea Graff 04/12/2020   I, Wilburn Mylar, am acting as scribe for Truitt Merle, MD.   {Add scribe attestation statement}

## 2020-04-13 ENCOUNTER — Inpatient Hospital Stay: Payer: Medicare HMO | Attending: Nurse Practitioner

## 2020-04-13 ENCOUNTER — Inpatient Hospital Stay: Payer: Medicare HMO | Admitting: Hematology

## 2020-04-13 ENCOUNTER — Inpatient Hospital Stay: Payer: Medicare HMO

## 2020-04-24 ENCOUNTER — Telehealth: Payer: Self-pay | Admitting: Hematology

## 2020-04-24 NOTE — Telephone Encounter (Signed)
Rescheduled per 4/7 sch msg. Called and spoke with pt daughter and confirmed 4/29 appts

## 2020-05-08 NOTE — Progress Notes (Signed)
[place]   [phone] Fax:([phone]) [phone]   Clinic Follow up Note   Patient Care Team: Sandi Mariscal, MD as PCP - General (Internal Medicine) Debara Pickett Nadean Corwin, MD as PCP - Cardiology (Cardiology) Jonnie Finner, RN as Oncology Nurse Navigator Alla Feeling, NP as Nurse Practitioner (Nurse Practitioner) Truitt Merle, MD as Consulting Physician (Hematology) Mansouraty, Telford Nab., MD as Consulting Physician (Gastroenterology)  Date of Service:  05/12/2020  CHIEF COMPLAINT: F/u pancreaticcancer  SUMMARY OF ONCOLOGIC HISTORY: Oncology History Overview Note  Cancer Staging Primary adenocarcinoma of body of pancreas [facility]) Staging form: Exocrine Pancreas, AJCC 8th Edition - Clinical stage from 07/08/2019: Stage III (cT4, cN1, cM0) - Signed by Truitt Merle, MD on 07/13/2019    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[place])  12/21/2018 Imaging   CT AP w contrast IMPRESSION: 1. Large amount of stool at the level of the rectum. Correlate for symptoms of fecal impaction. 2. Persistent dilatation of the distal pancreatic duct with abrupt cutoff at the level of the pancreatic body. Further evaluation with a nonemergent outpatient contrast enhanced MRI is recommended. 3. Nonobstructive left nephrolithiasis. 4. Subacute anterior right rib fractures.   Aortic Atherosclerosis (ICD10-I70.0).   12/24/2018 Imaging   MRCP IMPRESSION: 1. Suspect a 3.5 cm infiltrating pancreatic body neoplasm causing obstruction of the main pancreatic duct and atrophy of the pancreatic tail. The pancreatic head appears normal. 2. Limited examination due to lack of IV contrast and breathing motion artifact. Difficult to assess the vascular structures for invasion. 3. Recommend endoscopic biopsy. A dedicated high-resolution pancreatic CT scan with contrast may be helpful for further evaluation. 4. No obvious metastatic disease.   12/26/2018 Tumor Marker   CA 19-9: 250     05/05/2019 Procedure   EUS by Dr. Paulita Fujita An irregular mass was identified in the pancreatic body. The mass was hypoechoic. The mass measured 24 mm by 17 mm in maximal cross-sectional diameter. The endosonographic borders were poorlydefined. The remainder of the pancreas was examined. The endosonographic appearance of parenchyma and the upstream pancreatic duct indicated duct dilation. Appeared to be tissue ingrowth into portions of the dilated pancreatic duct. Fine needle aspiration for cytology was performed.  A few abnormal lymph nodes were visualized in the peripancreatic region. The nodes were oval, hypoechoic and had well defined margins. IMPRESSION - A mass was identified in the pancreatic body. This was staged T3 N1 Mx byendosonographic criteria. Fine needle aspiration performed. 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 - A few abnormal lymph nodes were visualized in the peripancreatic region. - Pancreatic parenchymal abnormalities consisting of edema and stranding were noted in the genu of the pancreas and pancreatic body. - There was no evidence of significant pathology.   05/29/2019 Imaging   CT CAP w contrast IMPRESSION: 1. Interval increase in size of poorly defined, hypoenhancing, infiltrating tumor arising from the body and proximal tail of pancreas. 2. Progressive vascular involvement of the portal vein, portal venous confluence, splenic vein, and celiac artery. 3. No specific features of solid organ metastases or metastatic disease to the chest. 4. Similar appearance of tiny, upper lung zone predominant solid and ground-glass attenuating nodules which may be postinflammatory/infectious in etiology. 5. Aortic atherosclerosis, in addition to 3 vessel coronary artery disease. Please note that although the presence of coronary artery calcium documents the presence of coronary artery disease, the severity of this disease and any potential  stenosis cannot be assessed on this non-gated CT examination. Assessment for potential  risk factor modification, dietary therapy or pharmacologic therapy may be warranted, if clinically indicated.   Aortic Atherosclerosis (ICD10-I70.0).   07/08/2019 Procedure   Repeat EUS for biopsy and fiducial placement by Dr. Ardis Hughs   1. Irregularly shaped, heterogeneous, hypoechoic mass was noted in the body of the pancreas, measuring 2.7cm across. The mass clearly invades the portal vein and obstructs the main pancreatic duct causing upstream dilation (5-28mm in the body and tail). Preliminary cytology review was positive for malignancy (adenocarcinoma) and then 4 gold fiducials were placed into the mass using the [employer] placement system. 2. CBD was normal, non-dilated. 3. Limited views of the liver, spleen were normal.      07/08/2019 Pathology Results   FINAL MICROSCOPIC DIAGNOSIS:  - Malignant cells consistent with adenocarcinoma    07/08/2019 Cancer Staging   Staging form: Exocrine Pancreas, AJCC 8th Edition - Clinical stage from 07/08/2019: Stage III (cT4, cN1, cM0) - Signed by Truitt Merle, MD on 07/13/2019   07/12/2019 Initial Diagnosis   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[place])   07/23/2019 - 08/06/2019 Chemotherapy   First-line Gemcitabine and Abraxane 2 weeks on/1 week off starting 07/23/19 with Gemcitabine alone for C1. Discontinued after 1 cycle due hospitalized twice for dyspnea, CHF and COPD exacerbation. I think her tolerance to chemo is very low.   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  The Common Hereditary Gene Panel offered by [facility] includes sequencing and/or deletion duplication testing of the following 56 genes: APC*, ATM*, AXIN2, BARD1, BMPR1A, BRCA1, BRCA2, BRIP1, CASR, CDH1, CDK4, CDKN2A (p14ARF), CDKN2A (p16INK4a),  CFTR*, CHEK2, CPA1, CTNNA1, CTRC, DICER1*, EPCAM*, FANCC, GREM1*, HOXB13, KIT, MEN1*, MLH1*, MSH2*, MSH3*, MSH6*, MUTYH, NBN, NF1*, NTHL1, PALB2, PALLD, PDGFRA, PMS2*, POLD1*, POLE, PRSS1*, PTEN*, RAD50, RAD51C, RAD51D, RNF43, SDHA*, SDHB, SDHC*, SDHD, SMAD4, SMARCA4, SPINK1, STK11, TP53, TSC1*, TSC2, and VHL.  The report date is September 21, 2019.   10/26/2019 - 11/05/2019 Radiation Therapy   SBRT with Dr Lisbeth Renshaw    01/06/2020 Imaging   CT CAP  IMPRESSION: 1. Diminished soft tissue thickening and ductal dilatation of the pancreatic body and tail about biopsy marking clips. Findings are consistent with treatment response of pancreatic malignancy. 2. Redemonstrated soft tissue thickening with improved narrowing about the central portal vein, with persistent effacement of the splenic vein, consistent with treatment response. 3. No evidence of lymphadenopathy or metastatic disease within the chest, abdomen, or pelvis. 4. Innumerable tiny centrilobular ground-glass nodules throughout the lungs, most concentrated at the apices, nonspecific and infectious or inflammatory, but most commonly seen in smoking-related respiratory bronchiolitis. Metastatic disease not favored. 5. Diffuse bilateral bronchial wall thickening with diffuse bilateral bronchial wall thickening, consistent with nonspecific infectious or inflammatory bronchitis. 6. Nonobstructive right nephrolithiasis versus vascular calcification. 7. Coronary artery disease.   Aortic Atherosclerosis (ICD10-I70.0).      CURRENT THERAPY:  Supportive/Palliative care   INTERVAL HISTORY:  Allison Vaughan is here for a follow up after SBRT. She was last seen by me 3 months ago. She presents to the clinic with her daughter. She has more coughing due to progressing COPD. She is still smoking. She notes she is on $Rem'15mg'wnka$  oxycodone 3-4 times a day. Her pain is controlled. This is for her back pain and her abdominal pain. She notes her pain  wakes her up at night. She also notes nausea. This is  helped by Zofran, best with sublingual.  At home she is able to fix her coffee, use bathroom and shower by herself. She can get up at home daily, but does not do housework or go out to shopping. She currently has [employer] home care nurse who comes out once a month. Her daughter and patient agreed with hospice now.    REVIEW OF SYSTEMS:   Constitutional: Denies fevers, chills or abnormal weight loss Eyes: Denies blurriness of vision Ears, nose, mouth, throat, and face: Denies mucositis or sore throat Respiratory: Denies cough, dyspnea or wheezes Cardiovascular: Denies palpitation, chest discomfort or lower extremity swelling Gastrointestinal:  Denies heartburn or change in bowel habits (+) Abdominal pain (+) Nausea Skin: Denies abnormal skin rashes MSK: (+) back pain  Lymphatics: Denies new lymphadenopathy or easy bruising Neurological:Denies numbness, tingling or new weaknesses Behavioral/Psych: Mood is stable, no new changes  All other systems were reviewed with the patient and are negative.  MEDICAL HISTORY:  Past Medical History:  Diagnosis Date  . Accidental drug overdose   . Acute encephalopathy   . Acute on chronic respiratory failure with hypoxia ([place]) 03/01/2017  . Acute renal failure ([place]) 04/16/2014  . AKI (acute kidney injury) ([place]) 04/30/2014  . Altered mental status 06/27/2012  . Anxiety   . Atrial fibrillation ([place])   . CAD (coronary artery disease)   . CAP (community acquired pneumonia) 04/18/2014  . Cardiomyopathy ([place])   . Chronic hypoxemic respiratory failure ([place]) 06/26/2012  . Chronic pain   . COPD exacerbation ([place]) 06/26/2012  . COPD with acute exacerbation ([place]) 03/01/2017  . DEPRESSIVE DISORDER, NOS 03/13/2006   Qualifier: Diagnosis of  By: Damita Dunnings MD, Phillip Heal    . Edema, peripheral   . Essential hypertension 04/30/2014  . Fall at home, initial encounter 03/01/2017  . GASTROESOPHAGEAL REFLUX, NO ESOPHAGITIS  03/13/2006   Qualifier: Diagnosis of  By: Damita Dunnings MD, Phillip Heal    . HYPERCHOLESTEROLEMIA 03/13/2006   Qualifier: Diagnosis of  By: Damita Dunnings MD, Phillip Heal    . Hyperlipidemia   . HYPERTENSION, BENIGN SYSTEMIC 03/13/2006   Qualifier: Diagnosis of  By: Damita Dunnings MD, Phillip Heal    . Hypokalemia 04/30/2014  . Hyponatremia 04/30/2014  . Hypotension 04/30/2014  . Hypoxemia 06/26/2012  . Hypoxia 03/26/2018  . Irritable bowel syndrome 03/13/2006   Qualifier: Diagnosis of  By: Damita Dunnings MD, Phillip Heal    . Lung nodule 04/16/2014  . MENOPAUSAL SYNDROME 03/13/2006   Qualifier: Diagnosis of  By: Damita Dunnings MD, Phillip Heal    . Mood disorder [facility]) 03/13/2006   Qualifier: Diagnosis of  By: Damita Dunnings MD, Phillip Heal    . 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 Nausea 04/30/2014  . Nausea vomiting and diarrhea 04/16/2014  . OSTEOARTHRITIS, MULTI SITES 03/13/2006   Qualifier: Diagnosis of  By: Damita Dunnings MD, Phillip Heal    . Pulmonary nodule 04/30/2014  . Respiratory failure ([place]) 01/22/2017  . TOBACCO DEPENDENCE 03/13/2006   Qualifier: Diagnosis of  By: Damita Dunnings MD, Phillip Heal    . Weight loss     SURGICAL HISTORY: Past Surgical History:  Procedure Laterality Date  . ABDOMINAL HYSTERECTOMY    . ESOPHAGOGASTRODUODENOSCOPY N/A 07/08/2019   Procedure: ESOPHAGOGASTRODUODENOSCOPY (EGD);  Surgeon: Milus Banister, MD;  Location: [facility];  Service: Endoscopy;  Laterality: N/A;  . ESOPHAGOGASTRODUODENOSCOPY (EGD) WITH PROPOFOL N/A 05/05/2019   Procedure: ESOPHAGOGASTRODUODENOSCOPY (EGD) WITH PROPOFOL;  Surgeon: Arta Silence, MD;  Location: [facility];  Service: Endoscopy;  Laterality: N/A;  . EUS N/A 07/08/2019   Procedure: UPPER ENDOSCOPIC ULTRASOUND (EUS) RADIAL;  Surgeon: Milus Banister, MD;  Location: [facility];  Service: Endoscopy;  Laterality: N/A;  . EYE SURGERY    . FIDUCIAL MARKER PLACEMENT N/A 07/08/2019   Procedure: FIDUCIAL MARKER PLACEMENT;  Surgeon: Milus Banister, MD;  Location: [facility];  Service: Endoscopy;  Laterality: N/A;  . FINE  NEEDLE ASPIRATION N/A 05/05/2019   Procedure: FINE NEEDLE ASPIRATION (FNA) LINEAR;  Surgeon: Arta Silence, MD;  Location: [facility];  Service: Endoscopy;  Laterality: N/A;  . FINE NEEDLE ASPIRATION N/A 07/08/2019   Procedure: FINE NEEDLE ASPIRATION (FNA) LINEAR;  Surgeon: Milus Banister, MD;  Location: [facility];  Service: Endoscopy;  Laterality: N/A;  . IR IMAGING GUIDED PORT INSERTION  07/27/2019  . ROTATOR CUFF REPAIR Right 2012  . UPPER ESOPHAGEAL ENDOSCOPIC ULTRASOUND (EUS) N/A 05/05/2019   Procedure: UPPER ESOPHAGEAL ENDOSCOPIC ULTRASOUND (EUS) with FNA;  Surgeon: Arta Silence, MD;  Location: [facility];  Service: Endoscopy;  Laterality: N/A;    I have reviewed the social history and family history with the patient and they are unchanged from previous note.  ALLERGIES:  has No Known Allergies.  MEDICATIONS:  Current Outpatient Medications  Medication Sig Dispense Refill  . ondansetron (ZOFRAN ODT) 4 MG disintegrating tablet Take 1 tablet (4 mg total) by mouth every 12 (twelve) hours as needed for nausea or vomiting. 30 tablet 0  . albuterol (PROVENTIL HFA;VENTOLIN HFA) 108 (90 BASE) MCG/ACT inhaler Inhale 2 puffs into the lungs every 6 (six) hours as needed for wheezing. 1 Inhaler 2  . albuterol (PROVENTIL) (2.5 MG/3ML) 0.083% nebulizer solution Take 3 mLs (2.5 mg total) by nebulization every 2 (two) hours as needed for wheezing. 75 mL 12  . Aspirin-Salicylamide-Caffeine (BC HEADACHE PO) Take 1 packet by mouth 2 (two) times daily as needed (headaches).     Marland Kitchen atorvastatin (LIPITOR) 10 MG tablet Take 1 tablet (10 mg total) by mouth at bedtime. (Patient taking differently: Take 10 mg by mouth every evening. ) 30 tablet 0  . Calcium Carbonate (CALCIUM 500 PO) Take 500 mg by mouth daily.    . Cholecalciferol (VITAMIN D3) 50 MCG (2000 UT) capsule Take 2,000 Units by mouth daily.    Marland Kitchen FLUoxetine (PROZAC) 40 MG capsule Take 40 mg by mouth every evening.     . hydrALAZINE  (APRESOLINE) 50 MG tablet Take 1 tablet (50 mg total) by mouth every 8 (eight) hours. 90 tablet 0  . lidocaine-prilocaine (EMLA) cream Apply to affected area as needed weekly 30 g 3  . losartan (COZAAR) 25 MG tablet Take 1 tablet (25 mg total) by mouth daily. 90 tablet 3  . metoprolol succinate (TOPROL-XL) 25 MG 24 hr tablet TAKE 2 TABLETS EVERY DAY. TAKE WITH OR IMMEDIATELY FOLLOWING A MEAL. 180 tablet 1  . omeprazole (PRILOSEC) 40 MG capsule Take 40 mg by mouth every evening.    . Oxycodone HCl 10 MG TABS     . potassium chloride (KLOR-CON) 10 MEQ tablet Take 10 mEq by mouth every evening.     . prochlorperazine (COMPAZINE) 10 MG tablet TAKE 1 TABLET BY MOUTH EVERY 6 HOURS AS NEEDED (NAUSEA OR VOMITING). 30 tablet 1   No current facility-administered medications for this visit.    PHYSICAL EXAMINATION: ECOG PERFORMANCE STATUS: 3 - Symptomatic, >50% confined to bed  Vitals:   05/12/20 1126  BP: (!) 185/87  Pulse: 65  Resp: 18  Temp: (!) 97.5 F (36.4 C)  SpO2: 99%   Filed Weights   05/12/20 1126  Weight: 97  lb 9.6 oz (44.3 kg)    Due to COVID19 we will limit examination to appearance. Patient had no complaints.  GENERAL:alert, no distress and comfortable SKIN: skin color normal, no rashes or significant lesions EYES: normal, Conjunctiva are pink and non-injected, sclera clear  NEURO: alert & oriented x 3 with fluent speech   LABORATORY DATA:  I have reviewed the data as listed CBC Latest Ref Rng & Units 05/12/2020 01/06/2020 10/15/2019  WBC 4.0 - 10.5 K/uL 5.0 4.5 4.5  Hemoglobin 12.0 - 15.0 g/dL 12.2 12.5 11.0(L)  Hematocrit 36.0 - 46.0 % 33.7(L) 36.6 33.8(L)  Platelets 150 - 400 K/uL 194 135(L) 171     CMP Latest Ref Rng & Units 05/12/2020 01/06/2020 10/15/2019  Glucose 70 - 99 mg/dL 101(H) 118(H) 150(H)  BUN 8 - 23 mg/dL $Remove'16 10 11  'oDVNrQO$ Creatinine 0.44 - 1.00 mg/dL 1.26(H) 1.26(H) 1.34(H)  Sodium 135 - 145 mmol/L 133(L) 136 133(L)  Potassium 3.5 - 5.1 mmol/L 3.7 3.4(L)  4.0  Chloride 98 - 111 mmol/L 94(L) 95(L) 102  CO2 22 - 32 mmol/L $RemoveB'26 29 22  'eSdupOjD$ Calcium 8.9 - 10.3 mg/dL 9.2 9.6 8.6(L)  Total Protein 6.5 - 8.1 g/dL 6.4(L) 7.0 6.1(L)  Total Bilirubin 0.3 - 1.2 mg/dL 0.5 0.4 0.3  Alkaline Phos 38 - 126 U/L 76 79 95  AST 15 - 41 U/L 15 15 13(L)  ALT 0 - 44 U/L <6 7 <6      RADIOGRAPHIC STUDIES: I have personally reviewed the radiological images as listed and agreed with the findings in the report. No results found.   ASSESSMENT & PLAN:  Allison Vaughan is a 75 y.o. female with   1. Pancreatic neoplasm,cT4N1M0,unresectable -Sheinitiallypresented with gradual weight loss and abdominal discomfort with low back pain for 6-12 months, CA 19-9 is elevated to 250. 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  -HerEUS/biopsyfrom 07/08/19 showed malignant cells consistent with adenocarcinoma. Due to vascular invasion, she is not a surgical candidatethus her cancer is no longer curable but still treatable to control disease and prolong her life.  -I started her on systemicmoderate intensitychemotherapy with gemcitabine/Abraxane (withsingle agentgemcitabineforcycle 1)on days 1 and 8 q21 daysbeginning 07/23/19. This was Discontinued after 1 cycle of gemcitabine due hospitalized twice for dyspnea, CHF and COPD exacerbation.  -She proceeded with SBRT on 10/26/19-11/05/19. She has been on observation and chemo break since. Given prior poor toleration of chemo, her medical comorbilities and memory loss, I do not think she can tolerate chemo in future.  -She is slightly more symptomatic recently. She has more weight loss, she needs more help with some ADLs. Her pain still controlled. I discussed given she is not currently on treatment she is eligible for hospice home care for more help. I reviewed services with them.  Patient and daughter agreed. I will send referral.  -She still has PAC in place, but has not been getting regular flushes. She required TPA in port given no blood return. She is fine to keep it, but without further use.  -F/u open  2. Symptom management: Abdominal bloating/pain, weight loss, low back pain -Her abdominal bloating/pain and weight loss are c/w malignancy, has been gradual over 6-12 months. -She weighed 150-160 lbs before cancer diagnosis. -Her low back pain appears chronic, likely not entirely related to pancreatic mass -She is on long term oxycodone now $RemoveBefor'15mg'xSKuPHqVHRhy$  q6hours. She only uses 3 times daily which controls her  pain. Pain will continue to be managed by her PCP Dr. Nancy Fetter. -She has lost 8 pounds in the last 3-4 months. I recommend nutritional supplement Ensure and eat more high protein, high calorie diet.   3. Comorbidities: COPD, Cough, Afib, CAD, HTN, Anxiety/depression, Decreased memory -She has 50 year history of smoking 1-2PPD. She continues to smoke 1ppd although counseled on cessation -controlled, not oxygen dependent. on albuterol PRN  -She does have an increased cough, likely from progressed COPD. She does still smoke. She will continue to f/u with Dr Nancy Fetter.  -Mood is stable on fluoxetine and ativan at bedtime  -Her daughter has previously noted patient is having decreased shot term memory.    4. Goals of care, DNR/DNI -We again discussed the incurable nature of her cancer, and the overall poor prognosis, especially given she is no longer on treatment. -The patient understands the goal of care is palliative. -I recommend DNR/DNI, per daughter she is not interested in DNR/DNI (05/12/20)   PLAN: -I called in sublingual zofran  -Send hospice referral, I will remain to be her MD when she is under hospice care.  Follow-up open.   No problem-specific Assessment & Plan notes found for this encounter.   Orders Placed This Encounter  Procedures  . Ambulatory  referral to Hospice    Referral Priority:   Routine    Referral Type:   Consultation    Referral Reason:   Symptom Managment    Requested Specialty:   Hospice Services    Number of Visits Requested:   1   All questions were answered. The patient knows to call the clinic with any problems, questions or concerns. No barriers to learning was detected. The total time spent in the appointment was 30 minutes.     Truitt Merle, MD 05/12/2020   I, Joslyn Devon, am acting as scribe for Truitt Merle, MD.   I have reviewed the above documentation for accuracy and completeness, and I agree with the above.

## 2020-05-12 ENCOUNTER — Inpatient Hospital Stay (HOSPITAL_BASED_OUTPATIENT_CLINIC_OR_DEPARTMENT_OTHER): Payer: Medicare HMO | Admitting: Hematology

## 2020-05-12 ENCOUNTER — Inpatient Hospital Stay: Payer: Medicare HMO

## 2020-05-12 ENCOUNTER — Inpatient Hospital Stay: Payer: Medicare HMO | Attending: Nurse Practitioner

## 2020-05-12 ENCOUNTER — Encounter: Payer: Self-pay | Admitting: Hematology

## 2020-05-12 ENCOUNTER — Other Ambulatory Visit: Payer: Self-pay

## 2020-05-12 ENCOUNTER — Other Ambulatory Visit: Payer: Self-pay | Admitting: Hematology

## 2020-05-12 VITALS — BP 185/87 | HR 65 | Temp 97.5°F | Resp 18 | Wt 97.6 lb

## 2020-05-12 DIAGNOSIS — C251 Malignant neoplasm of body of pancreas: Secondary | ICD-10-CM

## 2020-05-12 DIAGNOSIS — Z79899 Other long term (current) drug therapy: Secondary | ICD-10-CM | POA: Insufficient documentation

## 2020-05-12 DIAGNOSIS — I10 Essential (primary) hypertension: Secondary | ICD-10-CM

## 2020-05-12 DIAGNOSIS — J42 Unspecified chronic bronchitis: Secondary | ICD-10-CM | POA: Diagnosis not present

## 2020-05-12 DIAGNOSIS — Z95828 Presence of other vascular implants and grafts: Secondary | ICD-10-CM

## 2020-05-12 LAB — CMP (CANCER CENTER ONLY)
ALT: <6 U/L (ref 0–44)
AST: 15 U/L (ref 15–41)
Albumin: 3.9 g/dL (ref 3.5–5.0)
Alkaline Phosphatase: 76 U/L (ref 38–126)
Anion gap: 13 (ref 5–15)
BUN: 16 mg/dL (ref 8–23)
CO2: 26 mmol/L (ref 22–32)
Calcium: 9.2 mg/dL (ref 8.9–10.3)
Chloride: 94 mmol/L — ABNORMAL LOW (ref 98–111)
Creatinine: 1.26 mg/dL — ABNORMAL HIGH (ref 0.44–1.00)
GFR, Estimated: 44 mL/min — ABNORMAL LOW (ref >60)
Glucose, Bld: 101 mg/dL — ABNORMAL HIGH (ref 70–99)
Potassium: 3.7 mmol/L (ref 3.5–5.1)
Sodium: 133 mmol/L — ABNORMAL LOW (ref 135–145)
Total Bilirubin: 0.5 mg/dL (ref 0.3–1.2)
Total Protein: 6.4 g/dL — ABNORMAL LOW (ref 6.5–8.1)

## 2020-05-12 LAB — CBC WITH DIFFERENTIAL (CANCER CENTER ONLY)
Abs Immature Granulocytes: 0.02 10*3/uL (ref 0.00–0.07)
Basophils Absolute: 0 10*3/uL (ref 0.0–0.1)
Basophils Relative: 1 %
Eosinophils Absolute: 0.2 10*3/uL (ref 0.0–0.5)
Eosinophils Relative: 4 %
HCT: 33.7 % — ABNORMAL LOW (ref 36.0–46.0)
Hemoglobin: 12.2 g/dL (ref 12.0–15.0)
Immature Granulocytes: 0 %
Lymphocytes Relative: 18 %
Lymphs Abs: 0.9 10*3/uL (ref 0.7–4.0)
MCH: 30.1 pg (ref 26.0–34.0)
MCHC: 36.2 g/dL — ABNORMAL HIGH (ref 30.0–36.0)
MCV: 83.2 fL (ref 80.0–100.0)
Monocytes Absolute: 0.5 10*3/uL (ref 0.1–1.0)
Monocytes Relative: 11 %
Neutro Abs: 3.3 10*3/uL (ref 1.7–7.7)
Neutrophils Relative %: 66 %
Platelet Count: 194 10*3/uL (ref 150–400)
RBC: 4.05 MIL/uL (ref 3.87–5.11)
RDW: 12.8 % (ref 11.5–15.5)
WBC Count: 5 10*3/uL (ref 4.0–10.5)
nRBC: 0 % (ref 0.0–0.2)

## 2020-05-12 MED ORDER — SODIUM CHLORIDE 0.9% FLUSH
10.0000 mL | Freq: Once | INTRAVENOUS | Status: AC
  Administered 2020-05-12: 10 mL
  Filled 2020-05-12: qty 10

## 2020-05-12 MED ORDER — ALTEPLASE 2 MG IJ SOLR
2.0000 mg | Freq: Once | INTRAMUSCULAR | Status: AC
  Administered 2020-05-12: 2 mg
  Filled 2020-05-12: qty 2

## 2020-05-12 MED ORDER — ONDANSETRON 4 MG PO TBDP: 4.0000 mg | ORAL_TABLET | Freq: Two times a day (BID) | ORAL | 0 refills | Status: AC | PRN

## 2020-05-12 MED ORDER — HEPARIN SOD (PORK) LOCK FLUSH 100 UNIT/ML IV SOLN
250.0000 [IU] | Freq: Once | INTRAVENOUS | Status: AC
Start: 2020-05-12 — End: 2020-05-12
  Administered 2020-05-12: 250 [IU]
  Filled 2020-05-12: qty 5

## 2020-05-13 LAB — CANCER ANTIGEN 19-9: CA 19-9: 145 U/mL — ABNORMAL HIGH (ref 0–35)

## 2020-05-23 ENCOUNTER — Other Ambulatory Visit: Payer: Self-pay

## 2020-05-23 ENCOUNTER — Emergency Department (HOSPITAL_COMMUNITY): Payer: Medicare HMO

## 2020-05-23 ENCOUNTER — Encounter (HOSPITAL_COMMUNITY): Payer: Self-pay

## 2020-05-23 ENCOUNTER — Inpatient Hospital Stay (HOSPITAL_COMMUNITY)
Admission: EM | Admit: 2020-05-23 | Discharge: 2020-05-26 | DRG: 689 | Disposition: A | Discharge status: Hospice | Payer: Medicare HMO | Attending: Family Medicine | Admitting: Family Medicine

## 2020-05-23 DIAGNOSIS — E871 Hypo-osmolality and hyponatremia: Secondary | ICD-10-CM | POA: Diagnosis present

## 2020-05-23 DIAGNOSIS — Z20822 Contact with and (suspected) exposure to covid-19: Secondary | ICD-10-CM | POA: Diagnosis present

## 2020-05-23 DIAGNOSIS — G9341 Metabolic encephalopathy: Secondary | ICD-10-CM | POA: Diagnosis present

## 2020-05-23 DIAGNOSIS — D61818 Other pancytopenia: Secondary | ICD-10-CM | POA: Diagnosis present

## 2020-05-23 DIAGNOSIS — M1990 Unspecified osteoarthritis, unspecified site: Secondary | ICD-10-CM | POA: Diagnosis present

## 2020-05-23 DIAGNOSIS — Z801 Family history of malignant neoplasm of trachea, bronchus and lung: Secondary | ICD-10-CM

## 2020-05-23 DIAGNOSIS — I429 Cardiomyopathy, unspecified: Secondary | ICD-10-CM | POA: Diagnosis present

## 2020-05-23 DIAGNOSIS — E785 Hyperlipidemia, unspecified: Secondary | ICD-10-CM | POA: Diagnosis present

## 2020-05-23 DIAGNOSIS — G8929 Other chronic pain: Secondary | ICD-10-CM | POA: Diagnosis present

## 2020-05-23 DIAGNOSIS — I480 Paroxysmal atrial fibrillation: Secondary | ICD-10-CM | POA: Diagnosis present

## 2020-05-23 DIAGNOSIS — B961 Klebsiella pneumoniae [K. pneumoniae] as the cause of diseases classified elsewhere: Secondary | ICD-10-CM | POA: Diagnosis present

## 2020-05-23 DIAGNOSIS — F32A Depression, unspecified: Secondary | ICD-10-CM | POA: Diagnosis present

## 2020-05-23 DIAGNOSIS — F418 Other specified anxiety disorders: Secondary | ICD-10-CM | POA: Diagnosis present

## 2020-05-23 DIAGNOSIS — R339 Retention of urine, unspecified: Secondary | ICD-10-CM | POA: Diagnosis not present

## 2020-05-23 DIAGNOSIS — N390 Urinary tract infection, site not specified: Secondary | ICD-10-CM | POA: Diagnosis present

## 2020-05-23 DIAGNOSIS — R54 Age-related physical debility: Secondary | ICD-10-CM | POA: Diagnosis present

## 2020-05-23 DIAGNOSIS — C251 Malignant neoplasm of body of pancreas: Secondary | ICD-10-CM | POA: Diagnosis present

## 2020-05-23 DIAGNOSIS — J9601 Acute respiratory failure with hypoxia: Secondary | ICD-10-CM | POA: Diagnosis present

## 2020-05-23 DIAGNOSIS — I130 Hypertensive heart and chronic kidney disease with heart failure and stage 1 through stage 4 chronic kidney disease, or unspecified chronic kidney disease: Secondary | ICD-10-CM | POA: Diagnosis present

## 2020-05-23 DIAGNOSIS — I5032 Chronic diastolic (congestive) heart failure: Secondary | ICD-10-CM | POA: Diagnosis present

## 2020-05-23 DIAGNOSIS — N3000 Acute cystitis without hematuria: Secondary | ICD-10-CM | POA: Diagnosis not present

## 2020-05-23 DIAGNOSIS — R0902 Hypoxemia: Secondary | ICD-10-CM

## 2020-05-23 DIAGNOSIS — Z8249 Family history of ischemic heart disease and other diseases of the circulatory system: Secondary | ICD-10-CM

## 2020-05-23 DIAGNOSIS — J9811 Atelectasis: Secondary | ICD-10-CM | POA: Diagnosis not present

## 2020-05-23 DIAGNOSIS — Z681 Body mass index (BMI) 19 or less, adult: Secondary | ICD-10-CM

## 2020-05-23 DIAGNOSIS — J449 Chronic obstructive pulmonary disease, unspecified: Secondary | ICD-10-CM | POA: Diagnosis present

## 2020-05-23 DIAGNOSIS — Z79899 Other long term (current) drug therapy: Secondary | ICD-10-CM

## 2020-05-23 DIAGNOSIS — I2510 Atherosclerotic heart disease of native coronary artery without angina pectoris: Secondary | ICD-10-CM | POA: Diagnosis present

## 2020-05-23 DIAGNOSIS — N179 Acute kidney failure, unspecified: Secondary | ICD-10-CM | POA: Diagnosis present

## 2020-05-23 DIAGNOSIS — Z90710 Acquired absence of both cervix and uterus: Secondary | ICD-10-CM

## 2020-05-23 DIAGNOSIS — F17210 Nicotine dependence, cigarettes, uncomplicated: Secondary | ICD-10-CM | POA: Diagnosis present

## 2020-05-23 DIAGNOSIS — N1831 Chronic kidney disease, stage 3a: Secondary | ICD-10-CM | POA: Diagnosis present

## 2020-05-23 DIAGNOSIS — E7800 Pure hypercholesterolemia, unspecified: Secondary | ICD-10-CM | POA: Diagnosis present

## 2020-05-23 DIAGNOSIS — K219 Gastro-esophageal reflux disease without esophagitis: Secondary | ICD-10-CM | POA: Diagnosis present

## 2020-05-23 DIAGNOSIS — C259 Malignant neoplasm of pancreas, unspecified: Secondary | ICD-10-CM

## 2020-05-23 DIAGNOSIS — B9620 Unspecified Escherichia coli [E. coli] as the cause of diseases classified elsewhere: Secondary | ICD-10-CM | POA: Diagnosis present

## 2020-05-23 DIAGNOSIS — Z515 Encounter for palliative care: Secondary | ICD-10-CM | POA: Diagnosis not present

## 2020-05-23 DIAGNOSIS — Z7189 Other specified counseling: Secondary | ICD-10-CM | POA: Diagnosis not present

## 2020-05-23 DIAGNOSIS — R0602 Shortness of breath: Secondary | ICD-10-CM | POA: Diagnosis not present

## 2020-05-23 DIAGNOSIS — M549 Dorsalgia, unspecified: Secondary | ICD-10-CM | POA: Diagnosis present

## 2020-05-23 DIAGNOSIS — R531 Weakness: Secondary | ICD-10-CM | POA: Diagnosis not present

## 2020-05-23 LAB — CBC WITH DIFFERENTIAL/PLATELET
Abs Immature Granulocytes: 0.05 10*3/uL (ref 0.00–0.07)
Basophils Absolute: 0 10*3/uL (ref 0.0–0.1)
Basophils Relative: 0 %
Eosinophils Absolute: 0 10*3/uL (ref 0.0–0.5)
Eosinophils Relative: 0 %
HCT: 30.7 % — ABNORMAL LOW (ref 36.0–46.0)
Hemoglobin: 10.5 g/dL — ABNORMAL LOW (ref 12.0–15.0)
Immature Granulocytes: 1 %
Lymphocytes Relative: 11 %
Lymphs Abs: 0.6 10*3/uL — ABNORMAL LOW (ref 0.7–4.0)
MCH: 30.6 pg (ref 26.0–34.0)
MCHC: 34.2 g/dL (ref 30.0–36.0)
MCV: 89.5 fL (ref 80.0–100.0)
Monocytes Absolute: 0.7 10*3/uL (ref 0.1–1.0)
Monocytes Relative: 13 %
Neutro Abs: 4 10*3/uL (ref 1.7–7.7)
Neutrophils Relative %: 75 %
Platelets: 160 10*3/uL (ref 150–400)
RBC: 3.43 MIL/uL — ABNORMAL LOW (ref 3.87–5.11)
RDW: 13.2 % (ref 11.5–15.5)
WBC: 5.3 10*3/uL (ref 4.0–10.5)
nRBC: 0 % (ref 0.0–0.2)

## 2020-05-23 LAB — COMPREHENSIVE METABOLIC PANEL
ALT: 9 U/L (ref 0–44)
AST: 14 U/L — ABNORMAL LOW (ref 15–41)
Albumin: 3.3 g/dL — ABNORMAL LOW (ref 3.5–5.0)
Alkaline Phosphatase: 61 U/L (ref 38–126)
Anion gap: 13 (ref 5–15)
BUN: 70 mg/dL — ABNORMAL HIGH (ref 8–23)
CO2: 22 mmol/L (ref 22–32)
Calcium: 8.5 mg/dL — ABNORMAL LOW (ref 8.9–10.3)
Chloride: 95 mmol/L — ABNORMAL LOW (ref 98–111)
Creatinine, Ser: 4.2 mg/dL — ABNORMAL HIGH (ref 0.44–1.00)
GFR, Estimated: 10 mL/min — ABNORMAL LOW (ref >60)
Glucose, Bld: 111 mg/dL — ABNORMAL HIGH (ref 70–99)
Potassium: 4.5 mmol/L (ref 3.5–5.1)
Sodium: 130 mmol/L — ABNORMAL LOW (ref 135–145)
Total Bilirubin: 0.3 mg/dL (ref 0.3–1.2)
Total Protein: 5.7 g/dL — ABNORMAL LOW (ref 6.5–8.1)

## 2020-05-23 LAB — URINALYSIS, ROUTINE W REFLEX MICROSCOPIC
Bilirubin Urine: NEGATIVE
Glucose, UA: NEGATIVE mg/dL
Hgb urine dipstick: NEGATIVE
Ketones, ur: NEGATIVE mg/dL
Nitrite: NEGATIVE
Protein, ur: NEGATIVE mg/dL
Specific Gravity, Urine: 1.012 (ref 1.005–1.030)
WBC, UA: >50 WBC/hpf — ABNORMAL HIGH (ref 0–5)
pH: 5 (ref 5.0–8.0)

## 2020-05-23 LAB — RESP PANEL BY RT-PCR (FLU A&B, COVID) ARPGX2
Influenza A by PCR: NEGATIVE
Influenza B by PCR: NEGATIVE
SARS Coronavirus 2 by RT PCR: NEGATIVE

## 2020-05-23 LAB — LACTIC ACID, PLASMA: Lactic Acid, Venous: 0.7 mmol/L (ref 0.5–1.9)

## 2020-05-23 MED ORDER — SODIUM CHLORIDE 0.9 % IV SOLN
1.0000 g | Freq: Once | INTRAVENOUS | Status: AC
  Administered 2020-05-23: 1 g via INTRAVENOUS
  Filled 2020-05-23: qty 10

## 2020-05-23 MED ORDER — SODIUM CHLORIDE 0.9 % IV SOLN
1.0000 g | INTRAVENOUS | Status: DC
  Administered 2020-05-24 – 2020-05-25 (×2): 1 g via INTRAVENOUS
  Filled 2020-05-23 (×2): qty 1
  Filled 2020-05-23: qty 10

## 2020-05-23 MED ORDER — SODIUM CHLORIDE 0.9 % IV BOLUS
1000.0000 mL | Freq: Once | INTRAVENOUS | Status: AC
  Administered 2020-05-23: 1000 mL via INTRAVENOUS

## 2020-05-23 MED ORDER — SODIUM CHLORIDE 0.9 % IV SOLN: INTRAVENOUS | Status: AC

## 2020-05-23 NOTE — Progress Notes (Signed)
Manufacturing engineer [facility])  Allison Vaughan was to be admitted to hospice services tonight in her home but was sent to the ED prior to this occurring.  She is not currently a hospice patient.  [facility] will continue to follow during admission and plan to enroll her in hospice when she discharges.  Thank you, Venia Carbon RN, BSN, [facility] Liaison

## 2020-05-23 NOTE — ED Notes (Signed)
Introduced myself to patient.

## 2020-05-23 NOTE — ED Triage Notes (Signed)
BIB EMS from home. Pt started hospice on Friday for pancreatic cancer. Patient was 80% on RA and shob per ems. 2lpm [place] placed. Family is requesting home oxygen

## 2020-05-23 NOTE — ED Notes (Signed)
Patient ambulated to the bedside commode with assistance.

## 2020-05-23 NOTE — ED Provider Notes (Signed)
[facility] DEPT Provider Note   CSN: [id number] Arrival date & time: 05/23/20  1415     History Chief Complaint  Patient presents with  . Shortness of Breath    Allison Vaughan is a 75 y.o. female w PMHx COPD, untreatable primary adenocarcinoma of the pancreas, atrial fibrillation, CHF, brought from home for hypoxia and altered mental status.  Patient's daughter provides history as patient is not able to provide much history due to mental status change.  Patient's daughter reports she was seen by her PCP on Friday.  They placed her on gabapentin.  She only had 1 dose of 300 mg gabapentin.  Her symptoms began that evening.  She has been more weak than usual, not walking on her own.  She is not eating or drinking much.  She is oriented to her baseline though less alert.  She has been complaining of nausea without vomiting.  Over some time now she has been receiving albuterol breathing treatments at home for her breathing though at times has been hypoxic in the undetectable range.  After neb treatments it improves to 76%.  Daughter has felt patient has been needing oxygen supplementation at home for some time now.  Plans for hospice consultation, hospice was scheduled to come to the house today, however worsened and was brought to the ED instead.  Reviewed oncology note of recent, Dr. Burr Medico.  It appears there was discussion for DNR/DNI, however patient's daughter is not agreeable.  She is consistent on the phone today, patient's daughter is not interested in DNR/DNI.  She would like full treatment.  She also expresses feeling overwhelmed at home with the increased care required over the weekend.    History provided by: Hanley Seamen.       Past Medical History:  Diagnosis Date  . Accidental drug overdose   . Acute encephalopathy   . Acute on chronic respiratory failure with hypoxia ([place]) 03/01/2017  . Acute renal failure ([place]) 04/16/2014  . AKI (acute  kidney injury) ([place]) 04/30/2014  . Altered mental status 06/27/2012  . Anxiety   . Atrial fibrillation ([place])   . CAD (coronary artery disease)   . CAP (community acquired pneumonia) 04/18/2014  . Cardiomyopathy ([place])   . Chronic hypoxemic respiratory failure ([place]) 06/26/2012  . Chronic pain   . COPD exacerbation ([place]) 06/26/2012  . COPD with acute exacerbation ([place]) 03/01/2017  . DEPRESSIVE DISORDER, NOS 03/13/2006   Qualifier: Diagnosis of  By: Damita Dunnings MD, Phillip Heal    . Edema, peripheral   . Essential hypertension 04/30/2014  . Fall at home, initial encounter 03/01/2017  . GASTROESOPHAGEAL REFLUX, NO ESOPHAGITIS 03/13/2006   Qualifier: Diagnosis of  By: Damita Dunnings MD, Phillip Heal    . HYPERCHOLESTEROLEMIA 03/13/2006   Qualifier: Diagnosis of  By: Damita Dunnings MD, Phillip Heal    . Hyperlipidemia   . HYPERTENSION, BENIGN SYSTEMIC 03/13/2006   Qualifier: Diagnosis of  By: Damita Dunnings MD, Phillip Heal    . Hypokalemia 04/30/2014  . Hyponatremia 04/30/2014  . Hypotension 04/30/2014  . Hypoxemia 06/26/2012  . Hypoxia 03/26/2018  . Irritable bowel syndrome 03/13/2006   Qualifier: Diagnosis of  By: Damita Dunnings MD, Phillip Heal    . Lung nodule 04/16/2014  . MENOPAUSAL SYNDROME 03/13/2006   Qualifier: Diagnosis of  By: Damita Dunnings MD, Phillip Heal    . Mood disorder [facility]) 03/13/2006   Qualifier: Diagnosis of  By: Damita Dunnings MD, Phillip Heal    . 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 Nausea 04/30/2014  . Nausea vomiting and diarrhea  04/16/2014  . OSTEOARTHRITIS, MULTI SITES 03/13/2006   Qualifier: Diagnosis of  By: Damita Dunnings MD, Phillip Heal    . Pulmonary nodule 04/30/2014  . Respiratory failure ([place]) 01/22/2017  . TOBACCO DEPENDENCE 03/13/2006   Qualifier: Diagnosis of  By: Damita Dunnings MD, Phillip Heal    . Weight loss     Patient Active Problem List   Diagnosis Date Noted  . Genetic testing 09/21/2019  . DNR (do not resuscitate)   . Advanced care planning/counseling discussion   . Palliative care by specialist   . Acute on chronic diastolic CHF (congestive heart failure) ([place])  08/24/2019  . Acute on chronic diastolic (congestive) heart failure ([place]) 08/24/2019  . COPD exacerbation ([place]) 08/13/2019  . Acute CHF (congestive heart failure) ([place]) 08/13/2019  . Hypertensive urgency 08/13/2019  . Allergy reaction, arthritis 08/09/2019  . Port-A-Cath in place 07/30/2019  . Goals of care, counseling/discussion 07/13/2019  . 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[place]) 07/12/2019  . Pancreatic neoplasm 06/24/2019  . Prolonged QT interval 12/22/2018  . Hypomagnesemia 12/22/2018  . Constipation 12/22/2018  . Dilation of pancreatic duct 12/22/2018  . Altered mental status 07/09/2018  . Acute lower UTI 07/09/2018  . Anemia 07/09/2018  . Bronchiolitis 07/09/2018  . Bronchitis 07/09/2018  . Acute metabolic encephalopathy [id number]  . Contusion of right wrist   . Fall   . Bacteria in urine   . Mild renal insufficiency 03/26/2018  . 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 Atrial fibrillation with RVR ([place]) 03/01/2017  . Acute on chronic respiratory failure with hypoxia ([place]) 03/01/2017  . Fall at home, initial encounter 03/01/2017  . Accidental drug overdose   . Acute respiratory failure with hypercapnia ([place]) 01/22/2017  . Acute encephalopathy   . AKI (acute kidney injury) ([place]) 04/30/2014  . Pulmonary nodule 04/30/2014  . Essential hypertension 04/30/2014  . Hypokalemia 04/30/2014  . Hyponatremia 04/30/2014  . Hypotension 04/30/2014  . Nausea & vomiting 04/30/2014  . Lung nodule 04/16/2014  . Chronic hypoxemic respiratory failure ([place]) 06/26/2012  . COPD (chronic obstructive pulmonary disease) ([place]) 06/26/2012  . HYPERCHOLESTEROLEMIA 03/13/2006  . TOBACCO DEPENDENCE 03/13/2006  . Mood disorder ([place]) 03/13/2006  . HYPERTENSION, BENIGN SYSTEMIC 03/13/2006  . GASTROESOPHAGEAL REFLUX, NO ESOPHAGITIS 03/13/2006  . IRRITABLE BOWEL SYNDROME 03/13/2006  . MENOPAUSAL SYNDROME 03/13/2006  . OSTEOARTHRITIS, MULTI SITES 03/13/2006    Past Surgical History:   Procedure Laterality Date  . ABDOMINAL HYSTERECTOMY    . ESOPHAGOGASTRODUODENOSCOPY N/A 07/08/2019   Procedure: ESOPHAGOGASTRODUODENOSCOPY (EGD);  Surgeon: Milus Banister, MD;  Location: [facility];  Service: Endoscopy;  Laterality: N/A;  . ESOPHAGOGASTRODUODENOSCOPY (EGD) WITH PROPOFOL N/A 05/05/2019   Procedure: ESOPHAGOGASTRODUODENOSCOPY (EGD) WITH PROPOFOL;  Surgeon: Arta Silence, MD;  Location: [facility];  Service: Endoscopy;  Laterality: N/A;  . EUS N/A 07/08/2019   Procedure: UPPER ENDOSCOPIC ULTRASOUND (EUS) RADIAL;  Surgeon: Milus Banister, MD;  Location: [facility];  Service: Endoscopy;  Laterality: N/A;  . EYE SURGERY    . FIDUCIAL MARKER PLACEMENT N/A 07/08/2019   Procedure: FIDUCIAL MARKER PLACEMENT;  Surgeon: Milus Banister, MD;  Location: [facility];  Service: Endoscopy;  Laterality: N/A;  . FINE NEEDLE ASPIRATION N/A 05/05/2019   Procedure: FINE NEEDLE ASPIRATION (FNA) LINEAR;  Surgeon: Arta Silence, MD;  Location: [facility];  Service: Endoscopy;  Laterality: N/A;  . FINE NEEDLE ASPIRATION N/A 07/08/2019   Procedure: FINE NEEDLE ASPIRATION (FNA) LINEAR;  Surgeon: Milus Banister, MD;  Location: [facility];  Service: Endoscopy;  Laterality: N/A;  . IR  IMAGING GUIDED PORT INSERTION  07/27/2019  . ROTATOR CUFF REPAIR Right 2012  . UPPER ESOPHAGEAL ENDOSCOPIC ULTRASOUND (EUS) N/A 05/05/2019   Procedure: UPPER ESOPHAGEAL ENDOSCOPIC ULTRASOUND (EUS) with FNA;  Surgeon: Arta Silence, MD;  Location: [facility];  Service: Endoscopy;  Laterality: N/A;     OB History   No obstetric history on file.     Family History  Problem Relation Age of Onset  . CAD Father   . Lung cancer Brother   . Cancer Brother        lung ca  . Cancer Brother        possible pancreas cancer   . Healthy Daughter   . Healthy Daughter   . Cancer Maternal Grandmother        breast  . Cancer Paternal Grandmother        breast    Social History   Tobacco Use  . Smoking  status: Current Every Day Smoker    Packs/day: 1.50    Years: 50.00    Pack years: 75.00    Types: Cigarettes  . Smokeless tobacco: Never Used  . Tobacco comment: states she is trying to quit  Vaping Use  . Vaping Use: Never used  Substance Use Topics  . Alcohol use: Not Currently    Comment: drank alcohol daily x20 years, quit 10 years ago  . Drug use: No    Home Medications Prior to Admission medications   Medication Sig Start Date End Date Taking? Authorizing Provider  albuterol (PROVENTIL HFA;VENTOLIN HFA) 108 (90 BASE) MCG/ACT inhaler Inhale 2 puffs into the lungs every 6 (six) hours as needed for wheezing. 06/09/12   Monika Salk, MD  albuterol (PROVENTIL) (2.5 MG/3ML) 0.083% nebulizer solution Take 3 mLs (2.5 mg total) by nebulization every 2 (two) hours as needed for wheezing. 04/20/14   Rama, Venetia Maxon, MD  Aspirin-Salicylamide-Caffeine (BC HEADACHE PO) Take 1 packet by mouth 2 (two) times daily as needed (headaches).     [provider]  atorvastatin (LIPITOR) 10 MG tablet Take 1 tablet (10 mg total) by mouth at bedtime. Patient taking differently: Take 10 mg by mouth every evening.  03/28/18   Cherene Altes, MD  Calcium Carbonate (CALCIUM 500 PO) Take 500 mg by mouth daily.    [provider]  Cholecalciferol (VITAMIN D3) 50 MCG (2000 UT) capsule Take 2,000 Units by mouth daily.    [provider]  FLUoxetine (PROZAC) 40 MG capsule Take 40 mg by mouth every evening.     [provider]  hydrALAZINE (APRESOLINE) 50 MG tablet Take 1 tablet (50 mg total) by mouth every 8 (eight) hours. 08/29/19 09/28/19  Antonieta Pert, MD  lidocaine-prilocaine (EMLA) cream Apply to affected area as needed weekly 07/22/19   Truitt Merle, MD  losartan (COZAAR) 25 MG tablet Take 1 tablet (25 mg total) by mouth daily. 09/06/19 12/05/19  Pixie Casino, MD  metoprolol succinate (TOPROL-XL) 25 MG 24 hr tablet TAKE 2 TABLETS EVERY DAY. TAKE WITH OR IMMEDIATELY FOLLOWING A  MEAL. 01/27/20   Hilty, Nadean Corwin, MD  omeprazole (PRILOSEC) 40 MG capsule Take 40 mg by mouth every evening.    [provider]  ondansetron (ZOFRAN ODT) 4 MG disintegrating tablet Take 1 tablet (4 mg total) by mouth every 12 (twelve) hours as needed for nausea or vomiting. 05/12/20   Truitt Merle, MD  Oxycodone HCl 10 MG TABS  09/07/19   [provider]  potassium chloride (KLOR-CON) 10 MEQ  tablet Take 10 mEq by mouth every evening.  04/15/19   [provider]  prochlorperazine (COMPAZINE) 10 MG tablet TAKE 1 TABLET BY MOUTH EVERY 6 HOURS AS NEEDED (NAUSEA OR VOMITING). 01/05/20   Truitt Merle, MD    Allergies    Patient has no known allergies.  Review of Systems   Review of Systems  Unable to perform ROS: Mental status change  Constitutional: Positive for appetite change and fatigue. Negative for fever.  Respiratory: Positive for cough and shortness of breath.   Gastrointestinal: Positive for nausea.  Neurological: Positive for weakness (generalized).    Physical Exam Updated Vital Signs BP 123/68   Pulse 71   Temp 98.9 F (37.2 C) (Rectal)   Resp 17   Ht 5\' 2"  (1.575 m)   Wt 49 kg   SpO2 98%   BMI 19.76 kg/m   Physical Exam Vitals and nursing note reviewed.  Constitutional:      Appearance: She is well-developed.     Comments: Sleepy though easily arousable to verbal stimuli.  She is ill-appearing  HENT:     Head: Normocephalic and atraumatic.  Eyes:     Conjunctiva/sclera: Conjunctivae normal.  Cardiovascular:     Rate and Rhythm: Normal rate.  Pulmonary:     Effort: Pulmonary effort is normal.     Comments: Diminished bilaterally.  Oxygenating well on 2 L nasal cannula Abdominal:     General: Bowel sounds are normal.     Palpations: Abdomen is soft.     Tenderness: There is no abdominal tenderness. There is no guarding.  Musculoskeletal:     Comments: Trace pedal edema bilaterally  Skin:    General: Skin is warm.  Neurological:      Comments: Oriented to person and place.  Not oriented to time (baseline per daughter)   Psychiatric:        Behavior: Behavior normal.     ED Results / Procedures / Treatments   Labs (all labs ordered are listed, but only abnormal results are displayed) Labs Reviewed  CBC WITH DIFFERENTIAL/PLATELET - Abnormal; Notable for the following components:      Result Value   RBC 3.43 (*)    Hemoglobin 10.5 (*)    HCT 30.7 (*)    Lymphs Abs 0.6 (*)    All other components within normal limits  COMPREHENSIVE METABOLIC PANEL - Abnormal; Notable for the following components:   Sodium 130 (*)    Chloride 95 (*)    Glucose, Bld 111 (*)    BUN 70 (*)    Creatinine, Ser 4.20 (*)    Calcium 8.5 (*)    Total Protein 5.7 (*)    Albumin 3.3 (*)    AST 14 (*)    GFR, Estimated 10 (*)    All other components within normal limits  URINALYSIS, ROUTINE W REFLEX MICROSCOPIC - Abnormal; Notable for the following components:   APPearance HAZY (*)    Leukocytes,Ua LARGE (*)    WBC, UA >50 (*)    Bacteria, UA MANY (*)    All other components within normal limits  RESP PANEL BY RT-PCR (FLU A&B, COVID) [id number]  URINE CULTURE  LACTIC ACID, PLASMA    EKG EKG Interpretation  Date/Time:  Tuesday May 23 2020 17:24:38 EDT Ventricular Rate:  74 PR Interval:  144 QRS Duration: 120 QT Interval:  470 QTC Calculation: 522 R Axis:   88 Text Interpretation: Sinus rhythm Nonspecific intraventricular conduction delay Anteroseptal infarct, age indeterminate Confirmed  by Dene Gentry [id number]) on 05/23/2020 5:32:35 PM   Radiology DG Chest Port 1 View  Result Date: 05/23/2020 CLINICAL DATA:  Shortness of breath and cough. EXAM: PORTABLE CHEST 1 VIEW COMPARISON:  08/26/2019 FINDINGS: 1654 hours. The lungs are clear without focal pneumonia, edema, pneumothorax or pleural effusion. Cardiopericardial silhouette is at upper limits of normal for size. Bones crash that Bones are diffusely demineralized. Telemetry  leads overlie the chest. Right-sided Port-A-Cath again noted. IMPRESSION: No active disease. Electronically Signed   By: Misty Stanley M.D.   On: 05/23/2020 17:09    Procedures Procedures   Medications Ordered in ED Medications  cefTRIAXone (ROCEPHIN) 1 g in sodium chloride 0.9 % 100 mL IVPB (has no administration in time range)  sodium chloride 0.9 % bolus 1,000 mL (1,000 mLs Intravenous New Bag/Given 05/23/20 2003)    ED Course  I have reviewed the triage vital signs and the nursing notes.  Pertinent labs & imaging results that were available during my care of the patient were reviewed by me and considered in my medical decision making (see chart for details).    MDM Rules/Calculators/A&P                          Patient is a 75 year old female with history of primary adenocarcinoma the pancreas, that is untreatable.  Plans for hospice consultation today at home, however has been gradually more fatigued and generally weak over the last few days.  Intermittently hypoxic at home.  Decreased appetite and p.o. intake.  Sleeping more and less alert per patient's daughter.  Patient's daughter declines DNR/DNI, would like patient to be treated today.  O2 sat is 89% here on room air, she is satting well on 2 L nasal cannula with normal work of breathing.  She is found to have UTI on UA, This is treated with Rocephin.  Urine culture sent.  Metabolic panel shows acute renal insufficiency with creatinine of 4.2, BUN of 70.  Sodium of 130.  CBG 111.  Hemoglobin without acute significant change from baseline.  Chest x-ray is clear.  She is afebrile per rectal temp, vital signs remained stable.  COVID and flu swabs are negative.   Patient's presentation is multifactorial.  UTI may be source of patient's change in mental status.  Discussed hypoxia with attending physician.  Patient is not appropriate for CTA with poor renal function.  Consider PE though respiratory symptoms seem to be gradually worsening  over time per discussion with daughter.  She is not wheezing here, did not believe nebulized treatment would benefit.  She is not febrile, no leukocytosis, and no infiltrate to suggest pneumonia.  Patient will be admitted to the hospital service for further management of altered mental status in the setting of acute UTI, as well as new oxygen requirement. Final Clinical Impression(s) / ED Diagnoses Final diagnoses:  Hypoxia  Acute UTI    Rx / DC Orders ED Discharge Orders    None       Koryn Charlot, [place] N, PA-C 05/23/20 2201    Valarie Merino, MD 05/24/20 1610

## 2020-05-24 ENCOUNTER — Other Ambulatory Visit: Payer: Self-pay

## 2020-05-24 DIAGNOSIS — R531 Weakness: Secondary | ICD-10-CM

## 2020-05-24 DIAGNOSIS — Z515 Encounter for palliative care: Secondary | ICD-10-CM

## 2020-05-24 DIAGNOSIS — C259 Malignant neoplasm of pancreas, unspecified: Secondary | ICD-10-CM

## 2020-05-24 DIAGNOSIS — J9601 Acute respiratory failure with hypoxia: Secondary | ICD-10-CM

## 2020-05-24 DIAGNOSIS — Z7189 Other specified counseling: Secondary | ICD-10-CM

## 2020-05-24 LAB — CBC
HCT: 27.7 % — ABNORMAL LOW (ref 36.0–46.0)
Hemoglobin: 9.5 g/dL — ABNORMAL LOW (ref 12.0–15.0)
MCH: 30.9 pg (ref 26.0–34.0)
MCHC: 34.3 g/dL (ref 30.0–36.0)
MCV: 90.2 fL (ref 80.0–100.0)
Platelets: 114 10*3/uL — ABNORMAL LOW (ref 150–400)
RBC: 3.07 MIL/uL — ABNORMAL LOW (ref 3.87–5.11)
RDW: 13.1 % (ref 11.5–15.5)
WBC: 2.8 10*3/uL — ABNORMAL LOW (ref 4.0–10.5)
nRBC: 0 % (ref 0.0–0.2)

## 2020-05-24 LAB — BASIC METABOLIC PANEL
Anion gap: 7 (ref 5–15)
BUN: 66 mg/dL — ABNORMAL HIGH (ref 8–23)
CO2: 25 mmol/L (ref 22–32)
Calcium: 8.1 mg/dL — ABNORMAL LOW (ref 8.9–10.3)
Chloride: 100 mmol/L (ref 98–111)
Creatinine, Ser: 3.43 mg/dL — ABNORMAL HIGH (ref 0.44–1.00)
GFR, Estimated: 13 mL/min — ABNORMAL LOW (ref >60)
Glucose, Bld: 90 mg/dL (ref 70–99)
Potassium: 3.9 mmol/L (ref 3.5–5.1)
Sodium: 132 mmol/L — ABNORMAL LOW (ref 135–145)

## 2020-05-24 MED ORDER — FLUOXETINE HCL 20 MG PO CAPS
40.0000 mg | ORAL_CAPSULE | Freq: Every evening | ORAL | Status: DC
  Administered 2020-05-24 – 2020-05-25 (×2): 40 mg via ORAL
  Filled 2020-05-24 (×3): qty 2

## 2020-05-24 MED ORDER — HEPARIN SODIUM (PORCINE) 5000 UNIT/ML IJ SOLN
5000.0000 [IU] | Freq: Three times a day (TID) | INTRAMUSCULAR | Status: DC
  Administered 2020-05-24 – 2020-05-26 (×7): 5000 [IU] via SUBCUTANEOUS
  Filled 2020-05-24 (×7): qty 1

## 2020-05-24 MED ORDER — HYDROMORPHONE HCL 1 MG/ML IJ SOLN
1.0000 mg | INTRAMUSCULAR | Status: AC | PRN
  Administered 2020-05-24 (×2): 1 mg via INTRAVENOUS
  Filled 2020-05-24 (×2): qty 1

## 2020-05-24 MED ORDER — ORAL CARE MOUTH RINSE
15.0000 mL | Freq: Two times a day (BID) | OROMUCOSAL | Status: DC
  Administered 2020-05-24 (×2): 15 mL via OROMUCOSAL

## 2020-05-24 MED ORDER — CHLORHEXIDINE GLUCONATE 0.12 % MT SOLN
15.0000 mL | Freq: Two times a day (BID) | OROMUCOSAL | Status: DC
  Administered 2020-05-24 – 2020-05-26 (×4): 15 mL via OROMUCOSAL
  Filled 2020-05-24 (×4): qty 15

## 2020-05-24 MED ORDER — ALBUTEROL SULFATE (2.5 MG/3ML) 0.083% IN NEBU: 2.5000 mg | INHALATION_SOLUTION | RESPIRATORY_TRACT | Status: DC | PRN

## 2020-05-24 MED ORDER — SODIUM CHLORIDE 0.9% FLUSH: 10.0000 mL | INTRAVENOUS | Status: DC | PRN

## 2020-05-24 MED ORDER — PANTOPRAZOLE SODIUM 40 MG PO TBEC
40.0000 mg | DELAYED_RELEASE_TABLET | Freq: Every day | ORAL | Status: DC
  Administered 2020-05-24 – 2020-05-26 (×3): 40 mg via ORAL
  Filled 2020-05-24 (×3): qty 1

## 2020-05-24 MED ORDER — PREDNISONE 20 MG PO TABS
40.0000 mg | ORAL_TABLET | Freq: Every day | ORAL | Status: DC
  Administered 2020-05-24: 40 mg via ORAL
  Filled 2020-05-24: qty 2

## 2020-05-24 MED ORDER — CHLORHEXIDINE GLUCONATE CLOTH 2 % EX PADS
6.0000 | MEDICATED_PAD | Freq: Every day | CUTANEOUS | Status: DC
  Administered 2020-05-24 – 2020-05-26 (×3): 6 via TOPICAL

## 2020-05-24 MED ORDER — IPRATROPIUM-ALBUTEROL 0.5-2.5 (3) MG/3ML IN SOLN
3.0000 mL | Freq: Three times a day (TID) | RESPIRATORY_TRACT | Status: DC
  Administered 2020-05-24 – 2020-05-26 (×7): 3 mL via RESPIRATORY_TRACT
  Filled 2020-05-24 (×7): qty 3

## 2020-05-24 MED ORDER — IPRATROPIUM-ALBUTEROL 0.5-2.5 (3) MG/3ML IN SOLN
3.0000 mL | Freq: Four times a day (QID) | RESPIRATORY_TRACT | Status: DC
  Administered 2020-05-24: 3 mL via RESPIRATORY_TRACT
  Filled 2020-05-24: qty 3

## 2020-05-24 MED ORDER — FENTANYL 12 MCG/HR TD PT72
1.0000 | MEDICATED_PATCH | TRANSDERMAL | Status: DC
  Administered 2020-05-25: 1 via TRANSDERMAL
  Filled 2020-05-24: qty 1

## 2020-05-24 MED ORDER — PREDNISONE 20 MG PO TABS: 40.0000 mg | ORAL_TABLET | Freq: Every day | ORAL | Status: DC

## 2020-05-24 MED ORDER — SODIUM CHLORIDE 0.9% FLUSH
10.0000 mL | Freq: Two times a day (BID) | INTRAVENOUS | Status: DC
  Administered 2020-05-24 – 2020-05-26 (×3): 10 mL

## 2020-05-24 MED ORDER — SODIUM CHLORIDE 0.9 % IV SOLN: INTRAVENOUS | Status: DC

## 2020-05-24 NOTE — Progress Notes (Signed)
PROGRESS NOTE    Allison Vaughan  [id number] DOB: Oct 24, 1945 DOA: 05/23/2020 PCP: Sandi Mariscal, MD    Brief Narrative:  Allison Vaughan was admitted to the hospital with working diagnosis of acute metabolic encephalopathy and acute kidney injury in the setting of urinary tract infection   75 year old female past medical history for pancreatic adenocarcinoma, currently on palliative treatment, she also has hypertension, atrial fibrillation, chronic diastolic heart failure, COPD, history of prolonged QT and GERD. Patient was unable to give detailed history.  Patient was recently started on gabapentin, reported worsening generalized weakness, on the day of hospitalization she was noted to be hypoxic.  She was brought to the hospital for further evaluation.  Apparently she was evaluated as an outpatient for hospice services. On her initial physical examination her oximetry was 89%, her blood pressure 133/61, heart rate 74, respiratory rate 15.  Lungs are showing no wheezing or rhonchi, heart S1-S2, present, rhythmic, soft abdomen, no lower extremity edema.  Patient was oriented x1.  Sodium 130, potassium 4.5, chloride 95, bicarb 22, glucose 111, BUN 17, creatinine 4.20, white count 5.3, hemoglobin 10.5, hematocrit 30.7, platelets 160 SARS COVID-19 negative.  Urinalysis specific gravity 1.012, 11-20 red cells,> 50 white cells. Chest radiograph no infiltrates, enlarged pulmonary artery on the right.  EKG 74 bpm, normal axis, QTC 522, sinus rhythm, poor R wave progression no significant ST segment changes, inferior lateral T wave inversions.  Assessment & Plan:   Active Problems:   AKI (acute kidney injury) ([place])   Acute metabolic encephalopathy   UTI (urinary tract infection)   Acute respiratory failure with hypoxia (HCC)   Pancreatic adenocarcinoma ([place])   1. Acute metabolic encephalopathy, in the setting of urinary tract infection (present on admission, no sepsis). Patient awake and  alert, continue to be confused but not agitated, not clear her baseline.   Continue supportive medical care with IV fluids and neuro checks. Antibiotic therapy with ceftriaxone.  Out of bed as tolerated, will talk to her daughter about hospice.   2. Acute hypoxemic respiratory failure, likely due to atelectasis, clinically no signs of COPD exacerbation. Will hold on steroids, continue with as needed bronchodilators.  Supplemental 02 per [place] as needed.  Encourage incentive spirometer.   3. AKI on CKD stage 3a Pre-renal AKI, renal function today with serum cr at 3.42 with bun 66. K is 3,9 and serum Na is 132. Continue hydration with isotonic saline and follow up on renal function in am. Avoid hypotension and nephrotoxic medications.   4. Pancreatic adenocarcinoma. Not candidate to palliative treatment, possible hospice, will talk to her family.   5. Depression. Continue with fluoxetine.   Patient continue to be at high risk for worsening encephalopathy   Status is: Inpatient  Remains inpatient appropriate because:Inpatient level of care appropriate due to severity of illness   Dispo: The patient is from: Home              Anticipated d/c is to: Home              Patient currently is not medically stable to d/c.   Difficult to place patient No   DVT prophylaxis: Heparin   Code Status:   full  Family Communication:  No family at the bedside       Antimicrobials:   Ceftriaxone     Subjective: Patient confused but not agitated, not in apparent pain or dyspnea, no nausea or vomiting.   Objective: Vitals:   05/24/20 0236 05/24/20  5366 05/24/20 0711 05/24/20 1111  BP:  127/75  115/72  Pulse:  77  81  Resp:  14  15  Temp:  98.4 F (36.9 C)  98.2 F (36.8 C)  TempSrc:  Oral  Oral  SpO2: 92% 96% 90% 92%  Weight:      Height:        Intake/Output Summary (Last 24 hours) at 05/24/2020 1213 Last data filed at 05/24/2020 1006 Gross per 24 hour  Intake 1110 ml  Output --   Net 1110 ml   Filed Weights   05/23/20 1429 05/23/20 1432 05/24/20 0059  Weight: 49.9 kg 49 kg 46.7 kg    Examination:   General: Not in pain or dyspnea, deconditioned and ill looking appearing  Neurology: Awake and alert, non focal  E ENT: mild pallor, no icterus, oral mucosa moist Cardiovascular: No JVD. S1-S2 present, rhythmic, no gallops, rubs, or murmurs. No lower extremity edema. Pulmonary: positive breath sounds bilaterally, with no wheezing, rhonchi or rales. Gastrointestinal. Abdomen soft and non tender Skin. No rashes Musculoskeletal: no joint deformities     Data Reviewed: I have personally reviewed following labs and imaging studies  CBC: Recent Labs  Lab 05/23/20 1714 05/24/20 0520  WBC 5.3 2.8*  NEUTROABS 4.0  --   HGB 10.5* 9.5*  HCT 30.7* 27.7*  MCV 89.5 90.2  PLT 160 440*   Basic Metabolic Panel: Recent Labs  Lab 05/23/20 1714 05/24/20 0520  NA 130* 132*  K 4.5 3.9  CL 95* 100  CO2 22 25  GLUCOSE 111* 90  BUN 70* 66*  CREATININE 4.20* 3.43*  CALCIUM 8.5* 8.1*   GFR: Estimated Creatinine Clearance: 10.1 mL/min (A) (by C-G formula based on SCr of 3.43 mg/dL (H)). Liver Function Tests: Recent Labs  Lab 05/23/20 1714  AST 14*  ALT 9  ALKPHOS 61  BILITOT 0.3  PROT 5.7*  ALBUMIN 3.3*   No results for input(s): LIPASE, AMYLASE in the last 168 hours. No results for input(s): AMMONIA in the last 168 hours. Coagulation Profile: No results for input(s): INR, PROTIME in the last 168 hours. Cardiac Enzymes: No results for input(s): CKTOTAL, CKMB, CKMBINDEX, TROPONINI in the last 168 hours. BNP (last 3 results) No results for input(s): PROBNP in the last 8760 hours. HbA1C: No results for input(s): HGBA1C in the last 72 hours. CBG: No results for input(s): GLUCAP in the last 168 hours. Lipid Profile: No results for input(s): CHOL, HDL, LDLCALC, TRIG, CHOLHDL, LDLDIRECT in the last 72 hours. Thyroid Function Tests: No results for  input(s): TSH, T4TOTAL, FREET4, T3FREE, THYROIDAB in the last 72 hours. Anemia Panel: No results for input(s): VITAMINB12, FOLATE, FERRITIN, TIBC, IRON, RETICCTPCT in the last 72 hours.    Radiology Studies: I have reviewed all of the imaging during this hospital visit personally     Scheduled Meds: . chlorhexidine  15 mL Mouth Rinse BID  . Chlorhexidine Gluconate Cloth  6 each Topical Daily  . FLUoxetine  40 mg Oral QPM  . heparin  5,000 Units Subcutaneous Q8H  . ipratropium-albuterol  3 mL Nebulization TID  . mouth rinse  15 mL Mouth Rinse q12n4p  . pantoprazole  40 mg Oral Daily  . predniSONE  40 mg Oral Q breakfast  . sodium chloride flush  10-40 mL Intracatheter Q12H   Continuous Infusions: . cefTRIAXone (ROCEPHIN)  IV       LOS: 1 day        Liahna Brickner Gerome Apley, MD

## 2020-05-24 NOTE — Progress Notes (Signed)
Manufacturing engineer (ACC)  Met with Allison Vaughan, [name] at the bedside, [name] was called and placed on speaker phone.  Discussed hospice with the family, they are in favor of comfort based approach to the care of Allison Vaughan. Daughters voiced concern over her sudden decline in mentation. Discussed possible causes (UTI). They are hopeful to get her as well as possible and then transition her to hospice at home.  DME discussed, they will need a hospital bed, O2, rollator and transport wheelchair. Allison Vaughan (whom Allison Vaughan resides with) requested DME not be ordered yet as she has furniture to remove in order to accommodate the needed DME.  Allison Vaughan was very concerned about her pain and how it would be managed going forward. PCP started her on gabapentin and her daughters attribute this to her sudden decline in functionality and do not want her to have this any more. Allison Vaughan inquired about a fentanyl patch as she was hopeful to avoid morphine with her mother.  Reached out to [name] manager to see if PMT could be consulted for recommendation with pain management.   ACC will continue to follow and anticipate enrolling her in hospice services once she has been medically optimized.  Thank you, Venia Carbon RN, BSN, [facility] Liaison

## 2020-05-24 NOTE — Progress Notes (Signed)
Acute urinary retention overnight.  Foley cath ordered per protocol.

## 2020-05-24 NOTE — Progress Notes (Signed)
   05/24/20 1445  Provider Notification  Provider Name/Title Dr. Cathlean Sauer  Date Provider Notified 05/24/20  Time Provider Notified 1445  Notification Type Page  Notification Reason Other (Comment) (patient has a bladder scan of 535, unable to urinate.)  Provider response Other (Comment) (Straight cath, 500 cc output on cath. performed with Sami, RN)  Date of Provider Response 05/24/20  Time of Provider Response 1445

## 2020-05-24 NOTE — Consult Note (Signed)
Consultation Note Date: 05/24/2020   Patient Name: Allison Vaughan  DOB: [birth year]  MRN: [id number]  Age / Sex: 75 y.o., female  PCP: Sandi Mariscal, MD Referring Physician: Tawni Millers  Reason for Consultation: Establishing goals of care  HPI/Patient Profile: 75 y.o. female admitted on 05/23/2020   Clinical Assessment and Goals of Care: Ms. Erdmann is 75 years old.  She lives at home with her daughter [name] here in [place], [place].  She is under the care of Dr. Burr Medico for pancreatic adenocarcinoma for which she was recently to be referred to hospice services.  Also has underlying hypertension atrial fibrillation chronic diastolic heart failure COPD gastroesophageal reflux disease.  Patient admitted to hospital medicine service with acute metabolic encephalopathy in the setting of urinary tract infection, acute hypoxic respiratory failure, acute kidney injury in the setting of stage III chronic kidney disease with underlying life limiting illness of pancreatic adenocarcinoma.  Patient started on IV fluids and antibiotics.  Hospice consultation has been completed here in the hospital.  Palliative consultation for additional goals of care discussions has been requested as well as for adequate pain and on pain symptom management options.  Patient is awake alert resting in bed.  Patient was noted to be quite sleepy earlier during the day, daughter arrived at bedside and patient is awake alert able to interact some, smiles, was able to take a few spoon full of chicken soup that the patient's daughter brought in.  I introduced myself and palliative care as follows:  Palliative medicine is specialized medical care for people living with serious illness. It focuses on providing relief from the symptoms and stress of a serious illness. The goal is to improve quality of life for both the patient and the  family.  Goals of care: Broad aims of medical therapy in relation to the patient's values and preferences. Our aim is to provide medical care aimed at enabling patients to achieve the goals that matter most to them, given the circumstances of their particular medical situation and their constraints.   Daughter [name] is aware of hospice services and hospice philosophy of care.  At present, durable medical equipment delivery is being arranged and the hospice liaison's have already connected with the patient and her daughter.  [name] is grateful for the information she has received from hospice liaison's.  Patient will require medical equipment such as hospital bed oxygen wheelchair and walker.  We discussed about pain and on pain symptom management regimen.  Patient has chronic back pain and was on oxycodone dose was increased recently.  We discussed about long-acting pain medication choice.  Patient currently on IV Dilaudid, monitor use and likely initiate low-dose transdermal fentanyl from 05-25-2020.  We also discussed about hospice philosophy of care and how they can be of benefit at this point.  Additionally CODE STATUS discussions also undertaken.  See below.  Thank you for the consult.  NEXT OF KIN Lives at home with daughter [name] who is her primary caregiver.  Also has another  daughter [name].  Patient's son died less than 2 years ago.  SUMMARY OF RECOMMENDATIONS   1.  CODE STATUS discussions undertaken.  Full code versus DNR/DNI discussed in detail.  Patient and daughter elect for DNR/DNI. 2.  Home with hospice once arrangements complete 3.  Continue current mode of care, treatment for urinary tract infection and treatment for other reversible conditions as of now. Thank you for the consult.  Code Status/Advance Care Planning:  DNR    Symptom Management:      Palliative Prophylaxis:   Delirium Protocol  Psycho-social/Spiritual:   Desire for further Chaplaincy  support:yes  Additional Recommendations: Education on Hospice  Prognosis:   < 6 months  Discharge Planning: Home with Hospice      Primary Diagnoses: Present on Admission: . UTI (urinary tract infection) . Acute metabolic encephalopathy . AKI (acute kidney injury) ([place])   I have reviewed the medical record, interviewed the patient and family, and examined the patient. The following aspects are pertinent.  Past Medical History:  Diagnosis Date  . Accidental drug overdose   . Acute encephalopathy   . Acute on chronic respiratory failure with hypoxia ([place]) 03/01/2017  . Acute renal failure ([place]) 04/16/2014  . AKI (acute kidney injury) ([place]) 04/30/2014  . Altered mental status 06/27/2012  . Anxiety   . Atrial fibrillation ([place])   . CAD (coronary artery disease)   . CAP (community acquired pneumonia) 04/18/2014  . Cardiomyopathy ([place])   . Chronic hypoxemic respiratory failure ([place]) 06/26/2012  . Chronic pain   . COPD exacerbation ([place]) 06/26/2012  . COPD with acute exacerbation ([place]) 03/01/2017  . DEPRESSIVE DISORDER, NOS 03/13/2006   Qualifier: Diagnosis of  By: Damita Dunnings MD, Phillip Heal    . Edema, peripheral   . Essential hypertension 04/30/2014  . Fall at home, initial encounter 03/01/2017  . GASTROESOPHAGEAL REFLUX, NO ESOPHAGITIS 03/13/2006   Qualifier: Diagnosis of  By: Damita Dunnings MD, Phillip Heal    . HYPERCHOLESTEROLEMIA 03/13/2006   Qualifier: Diagnosis of  By: Damita Dunnings MD, Phillip Heal    . Hyperlipidemia   . HYPERTENSION, BENIGN SYSTEMIC 03/13/2006   Qualifier: Diagnosis of  By: Damita Dunnings MD, Phillip Heal    . Hypokalemia 04/30/2014  . Hyponatremia 04/30/2014  . Hypotension 04/30/2014  . Hypoxemia 06/26/2012  . Hypoxia 03/26/2018  . Irritable bowel syndrome 03/13/2006   Qualifier: Diagnosis of  By: Damita Dunnings MD, Phillip Heal    . Lung nodule 04/16/2014  . MENOPAUSAL SYNDROME 03/13/2006   Qualifier: Diagnosis of  By: Damita Dunnings MD, Phillip Heal    . Mood disorder [facility]) 03/13/2006   Qualifier: Diagnosis of  By: Damita Dunnings MD, Phillip Heal     . 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 Nausea 04/30/2014  . Nausea vomiting and diarrhea 04/16/2014  . OSTEOARTHRITIS, MULTI SITES 03/13/2006   Qualifier: Diagnosis of  By: Damita Dunnings MD, Phillip Heal    . Pulmonary nodule 04/30/2014  . Respiratory failure ([place]) 01/22/2017  . TOBACCO DEPENDENCE 03/13/2006   Qualifier: Diagnosis of  By: Damita Dunnings MD, Phillip Heal    . Weight loss    Social History   Socioeconomic History  . Marital status: Single    Spouse name: Not on file  . Number of children: 3  . Years of education: Not on file  . Highest education level: Not on file  Occupational History  . Not on file  Tobacco Use  . Smoking status: Current Every Day Smoker    Packs/day: 1.50    Years: 50.00    Pack years: 75.00    Types:  Cigarettes  . Smokeless tobacco: Never Used  . Tobacco comment: states she is trying to quit  Vaping Use  . Vaping Use: Never used  Substance and Sexual Activity  . Alcohol use: Not Currently    Comment: drank alcohol daily x20 years, quit 10 years ago  . Drug use: No  . Sexual activity: Not on file  Other Topics Concern  . Not on file  Social History Narrative   Not working, lives with one daughter   Has 3 children, 2 daughters and 1 son; son died early 04-25-19 secondary to liver failure from drug-use related Hep C   Social Determinants of Health   Financial Resource Strain: Not on file  Food Insecurity: Not on file  Transportation Needs: Not on file  Physical Activity: Not on file  Stress: Not on file  Social Connections: Not on file   Family History  Problem Relation Age of Onset  . CAD Father   . Lung cancer Brother   . Cancer Brother        lung ca  . Cancer Brother        possible pancreas cancer   . Healthy Daughter   . Healthy Daughter   . Cancer Maternal Grandmother        breast  . Cancer Paternal Grandmother        breast   Scheduled Meds: . chlorhexidine  15 mL Mouth Rinse BID  . Chlorhexidine Gluconate Cloth  6 each Topical Daily  .  [START ON 05/25/2020] fentaNYL  1 patch Transdermal Q72H  . FLUoxetine  40 mg Oral QPM  . heparin  5,000 Units Subcutaneous Q8H  . ipratropium-albuterol  3 mL Nebulization TID  . mouth rinse  15 mL Mouth Rinse q12n4p  . pantoprazole  40 mg Oral Daily  . sodium chloride flush  10-40 mL Intracatheter Q12H   Continuous Infusions: . sodium chloride 75 mL/hr at 05/24/20 1450  . cefTRIAXone (ROCEPHIN)  IV     PRN Meds:.albuterol, sodium chloride flush Medications Prior to Admission:  Prior to Admission medications   Medication Sig Start Date End Date Taking? Authorizing Provider  albuterol (PROVENTIL HFA;VENTOLIN HFA) 108 (90 BASE) MCG/ACT inhaler Inhale 2 puffs into the lungs every 6 (six) hours as needed for wheezing. 06/09/12  Yes Oti, Brantley Stage, MD  albuterol (PROVENTIL) (2.5 MG/3ML) 0.083% nebulizer solution Take 3 mLs (2.5 mg total) by nebulization every 2 (two) hours as needed for wheezing. 04/20/14  Yes Rama, Venetia Maxon, MD  ALPRAZolam Duanne Moron) 1 MG tablet Take 1 mg by mouth at bedtime. 05/19/20  Yes [provider]  Aspirin-Salicylamide-Caffeine (BC HEADACHE PO) Take 1 packet by mouth 2 (two) times daily as needed (headaches).    Yes [provider]  atorvastatin (LIPITOR) 10 MG tablet Take 1 tablet (10 mg total) by mouth at bedtime. Patient taking differently: Take 10 mg by mouth every evening. 03/28/18  Yes Cherene Altes, MD  FLUoxetine (PROZAC) 40 MG capsule Take 40 mg by mouth every evening.    Yes [provider]  lactose free nutrition (BOOST) LIQD Take 237 mLs by mouth daily as needed (meal replacement). CHOCOLATE   Yes [provider]  losartan (COZAAR) 100 MG tablet Take 100 mg by mouth daily.   Yes [provider]  metoprolol succinate (TOPROL-XL) 25 MG 24 hr tablet TAKE 2 TABLETS EVERY DAY. TAKE WITH OR IMMEDIATELY FOLLOWING A MEAL. Patient taking differently: Take 50 mg by mouth daily. 01/27/20  Yes Hilty,  Nadean Corwin, MD  Multiple  Vitamin (MULTIVITAMIN WITH MINERALS) TABS tablet Take 1 tablet by mouth daily.   Yes [provider]  omeprazole (PRILOSEC) 40 MG capsule Take 40 mg by mouth every evening.   Yes [provider]  ondansetron (ZOFRAN ODT) 4 MG disintegrating tablet Take 1 tablet (4 mg total) by mouth every 12 (twelve) hours as needed for nausea or vomiting. 05/12/20  Yes Truitt Merle, MD  oxyCODONE (ROXICODONE) 15 MG immediate release tablet Take 15 mg by mouth 4 (four) times daily as needed for pain. 05/19/20  Yes [provider]  potassium chloride (KLOR-CON) 10 MEQ tablet Take 10 mEq by mouth every evening.  04/15/19  Yes [provider]  prochlorperazine (COMPAZINE) 10 MG tablet TAKE 1 TABLET BY MOUTH EVERY 6 HOURS AS NEEDED (NAUSEA OR VOMITING). Patient taking differently: Take 10 mg by mouth See admin instructions. 10mg  daily in the morning And 10mg  every 6 hours as needed for nausea/vomitting 01/05/20  Yes Truitt Merle, MD  losartan (COZAAR) 25 MG tablet Take 1 tablet (25 mg total) by mouth daily. Patient not taking: No sig reported 09/06/19 12/05/19  Pixie Casino, MD   No Known Allergies Review of Systems Positive for generalized weakness, positive for back pain.  Physical Exam Appears with generalized weakness resting in bed Patient confused at times Regular work of breathing Abdomen mildly distended Patient has some generalized edema Patient has muscle wasting S1-S2  Vital Signs: BP (!) 157/73 (BP Location: Right Arm)   Pulse 80   Temp 97.9 F (36.6 C) (Oral)   Resp 14   Ht 5\' 2"  (1.575 m)   Wt 46.7 kg   SpO2 92%   BMI 18.83 kg/m  Pain Scale: 0-10 POSS *See Group Information*: S-Acceptable,Sleep, easy to arouse Pain Score: 0-No pain   SpO2: SpO2:  (hands cold wont pick up) O2 Device:SpO2:  (hands cold wont pick up) O2 Flow Rate: .O2 Flow Rate (L/min): 2 L/min  IO: Intake/output summary:   Intake/Output Summary (Last 24 hours) at 05/24/2020 1633 Last  data filed at 05/24/2020 1457 Gross per 24 hour  Intake 1210 ml  Output 500 ml  Net 710 ml    LBM: Last BM Date:  (Unknown; pt denies feelings of constipation & does agree to having a BM in the last few days just not sure what day) Baseline Weight: Weight: 49.9 kg Most recent weight: Weight: 46.7 kg     Palliative Assessment/Data:   Palliative performance scale 40%.  Time In: 1530 Time Out:  1630 Time Total:  60 Greater than 50%  of this time was spent counseling and coordinating care related to the above assessment and plan.  Signed by: Loistine Chance, MD   Please contact Palliative Medicine Team phone at [phone] for questions and concerns.  For individual provider: See Shea Evans

## 2020-05-24 NOTE — Plan of Care (Signed)

## 2020-05-24 NOTE — H&P (Signed)
History and Physical    Allison Vaughan [id number] DOB: 1946-01-06 DOA: 05/23/2020  PCP: Sandi Mariscal, MD  Patient coming from: Home  I have personally briefly reviewed patient's old medical records in [place]  Chief Complaint: hypoxia  HPI: Allison Vaughan is a 75 y.o. female with medical history significant for pancreatic adenocarcinoma on palliative treatment, hypertension, atrial fibrillation, chronic diastolic CHF, COPD, history of prolonged QT, and GERD who presents with hypoxia and family wanting home oxygen.  Patient unable to provide any history.  She is only alert and oriented to self.  No family at bedside.  Per ED documentation, daughter notes that patient recently started on gabapentin and had 1 dose of 300 mg and was reportedly more weak than usual afterwards.  Not able to ambulate her own.  She has had decreased p.o. intake.  Also has been complaining of nausea without vomiting.  Poorly patient has had undetectable range of oxygen saturation at home and daughter felt that she has needed oxygen supplementation for some time.  There was plans for hospice to evaluate her at home today but they presented to the ED.  Patient was afebrile normotensive and had oxygen desaturation to 89% and placed on 2 L.  No leukocytosis.  Hemoglobin of 10.5 down from 12 about 2 weeks ago.  Sodium of 130, BG of 111.  Creatinine of 4.2 from prior of 1.26.  No urinary retention noted on bladder scan.  UA shows positive large leukocyte, negative nitrate and many bacteria.  Patient started on IV Rocephin and hospitalist called for admission.   Review of Systems:  Unable to obtain given patient's altered mental status  Past Medical History:  Diagnosis Date  . Accidental drug overdose   . Acute encephalopathy   . Acute on chronic respiratory failure with hypoxia ([place]) 03/01/2017  . Acute renal failure ([place]) 04/16/2014  . AKI (acute kidney injury) ([place]) 04/30/2014  . Altered mental status  06/27/2012  . Anxiety   . Atrial fibrillation ([place])   . CAD (coronary artery disease)   . CAP (community acquired pneumonia) 04/18/2014  . Cardiomyopathy ([place])   . Chronic hypoxemic respiratory failure ([place]) 06/26/2012  . Chronic pain   . COPD exacerbation ([place]) 06/26/2012  . COPD with acute exacerbation ([place]) 03/01/2017  . DEPRESSIVE DISORDER, NOS 03/13/2006   Qualifier: Diagnosis of  By: Damita Dunnings MD, Phillip Heal    . Edema, peripheral   . Essential hypertension 04/30/2014  . Fall at home, initial encounter 03/01/2017  . GASTROESOPHAGEAL REFLUX, NO ESOPHAGITIS 03/13/2006   Qualifier: Diagnosis of  By: Damita Dunnings MD, Phillip Heal    . HYPERCHOLESTEROLEMIA 03/13/2006   Qualifier: Diagnosis of  By: Damita Dunnings MD, Phillip Heal    . Hyperlipidemia   . HYPERTENSION, BENIGN SYSTEMIC 03/13/2006   Qualifier: Diagnosis of  By: Damita Dunnings MD, Phillip Heal    . Hypokalemia 04/30/2014  . Hyponatremia 04/30/2014  . Hypotension 04/30/2014  . Hypoxemia 06/26/2012  . Hypoxia 03/26/2018  . Irritable bowel syndrome 03/13/2006   Qualifier: Diagnosis of  By: Damita Dunnings MD, Phillip Heal    . Lung nodule 04/16/2014  . MENOPAUSAL SYNDROME 03/13/2006   Qualifier: Diagnosis of  By: Damita Dunnings MD, Phillip Heal    . Mood disorder [facility]) 03/13/2006   Qualifier: Diagnosis of  By: Damita Dunnings MD, Phillip Heal    . 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 Nausea 04/30/2014  . Nausea vomiting and diarrhea 04/16/2014  . OSTEOARTHRITIS, MULTI SITES 03/13/2006   Qualifier: Diagnosis of  By: Damita Dunnings MD, Phillip Heal    .  Pulmonary nodule 04/30/2014  . Respiratory failure ([place]) 01/22/2017  . TOBACCO DEPENDENCE 03/13/2006   Qualifier: Diagnosis of  By: Damita Dunnings MD, Phillip Heal    . Weight loss     Past Surgical History:  Procedure Laterality Date  . ABDOMINAL HYSTERECTOMY    . ESOPHAGOGASTRODUODENOSCOPY N/A 07/08/2019   Procedure: ESOPHAGOGASTRODUODENOSCOPY (EGD);  Surgeon: Milus Banister, MD;  Location: [facility];  Service: Endoscopy;  Laterality: N/A;  . ESOPHAGOGASTRODUODENOSCOPY (EGD) WITH PROPOFOL N/A  05/05/2019   Procedure: ESOPHAGOGASTRODUODENOSCOPY (EGD) WITH PROPOFOL;  Surgeon: Arta Silence, MD;  Location: [facility];  Service: Endoscopy;  Laterality: N/A;  . EUS N/A 07/08/2019   Procedure: UPPER ENDOSCOPIC ULTRASOUND (EUS) RADIAL;  Surgeon: Milus Banister, MD;  Location: [facility];  Service: Endoscopy;  Laterality: N/A;  . EYE SURGERY    . FIDUCIAL MARKER PLACEMENT N/A 07/08/2019   Procedure: FIDUCIAL MARKER PLACEMENT;  Surgeon: Milus Banister, MD;  Location: [facility];  Service: Endoscopy;  Laterality: N/A;  . FINE NEEDLE ASPIRATION N/A 05/05/2019   Procedure: FINE NEEDLE ASPIRATION (FNA) LINEAR;  Surgeon: Arta Silence, MD;  Location: [facility];  Service: Endoscopy;  Laterality: N/A;  . FINE NEEDLE ASPIRATION N/A 07/08/2019   Procedure: FINE NEEDLE ASPIRATION (FNA) LINEAR;  Surgeon: Milus Banister, MD;  Location: [facility];  Service: Endoscopy;  Laterality: N/A;  . IR IMAGING GUIDED PORT INSERTION  07/27/2019  . ROTATOR CUFF REPAIR Right 2012  . UPPER ESOPHAGEAL ENDOSCOPIC ULTRASOUND (EUS) N/A 05/05/2019   Procedure: UPPER ESOPHAGEAL ENDOSCOPIC ULTRASOUND (EUS) with FNA;  Surgeon: Arta Silence, MD;  Location: [facility];  Service: Endoscopy;  Laterality: N/A;     reports that she has been smoking cigarettes. She has a 75.00 pack-year smoking history. She has never used smokeless tobacco. She reports previous alcohol use. She reports that she does not use drugs. Social History  No Known Allergies  Family History  Problem Relation Age of Onset  . CAD Father   . Lung cancer Brother   . Cancer Brother        lung ca  . Cancer Brother        possible pancreas cancer   . Healthy Daughter   . Healthy Daughter   . Cancer Maternal Grandmother        breast  . Cancer Paternal Grandmother        breast     Prior to Admission medications   Medication Sig Start Date End Date Taking? Authorizing Provider  albuterol (PROVENTIL HFA;VENTOLIN HFA) 108 (90 BASE)  MCG/ACT inhaler Inhale 2 puffs into the lungs every 6 (six) hours as needed for wheezing. 06/09/12   Monika Salk, MD  albuterol (PROVENTIL) (2.5 MG/3ML) 0.083% nebulizer solution Take 3 mLs (2.5 mg total) by nebulization every 2 (two) hours as needed for wheezing. 04/20/14   Rama, Venetia Maxon, MD  ALPRAZolam Duanne Moron) 1 MG tablet Take 1 mg by mouth daily as needed. 05/19/20   [provider]  Aspirin-Salicylamide-Caffeine (BC HEADACHE PO) Take 1 packet by mouth 2 (two) times daily as needed (headaches).     [provider]  atorvastatin (LIPITOR) 10 MG tablet Take 1 tablet (10 mg total) by mouth at bedtime. Patient taking differently: Take 10 mg by mouth every evening. 03/28/18   Cherene Altes, MD  Calcium Carbonate (CALCIUM 500 PO) Take 500 mg by mouth daily.    [provider]  Cholecalciferol (VITAMIN D3) 50 MCG (2000 UT) capsule Take 2,000 Units by mouth daily.  [provider]  FLUoxetine (PROZAC) 40 MG capsule Take 40 mg by mouth every evening.     [provider]  gabapentin (NEURONTIN) 300 MG capsule Take 1 capsule by mouth 3 (three) times daily. 05/19/20   [provider]  hydrALAZINE (APRESOLINE) 50 MG tablet Take 1 tablet (50 mg total) by mouth every 8 (eight) hours. 08/29/19 09/28/19  Antonieta Pert, MD  lidocaine-prilocaine (EMLA) cream Apply to affected area as needed weekly Patient not taking: Reported on 05/23/2020 07/22/19   Truitt Merle, MD  losartan (COZAAR) 25 MG tablet Take 1 tablet (25 mg total) by mouth daily. Patient not taking: Reported on 05/23/2020 09/06/19 12/05/19  Pixie Casino, MD  metoprolol succinate (TOPROL-XL) 25 MG 24 hr tablet TAKE 2 TABLETS EVERY DAY. TAKE WITH OR IMMEDIATELY FOLLOWING A MEAL. 01/27/20   Hilty, Nadean Corwin, MD  omeprazole (PRILOSEC) 40 MG capsule Take 40 mg by mouth every evening.    [provider]  ondansetron (ZOFRAN ODT) 4 MG disintegrating tablet Take 1 tablet (4 mg total) by mouth every 12  (twelve) hours as needed for nausea or vomiting. 05/12/20   Truitt Merle, MD  Oxycodone HCl 10 MG TABS  09/07/19   [provider]  potassium chloride (KLOR-CON) 10 MEQ tablet Take 10 mEq by mouth every evening.  04/15/19   [provider]  prochlorperazine (COMPAZINE) 10 MG tablet TAKE 1 TABLET BY MOUTH EVERY 6 HOURS AS NEEDED (NAUSEA OR VOMITING). 01/05/20   Truitt Merle, MD    Physical Exam: Vitals:   05/23/20 2330 05/24/20 0000 05/24/20 0030 05/24/20 0059  BP: 133/61 124/61 (!) 117/92 132/74  Pulse: 74 74 72 71  Resp: 15 16 14 14   Temp:    98.5 F (36.9 C)  TempSrc:    Oral  SpO2: 99% 99% 100% 98%  Weight:    46.7 kg  Height:        Constitutional: NAD, calm, comfortable, nontoxic elderly female laying flat in bed Vitals:   05/23/20 2330 05/24/20 0000 05/24/20 0030 05/24/20 0059  BP: 133/61 124/61 (!) 117/92 132/74  Pulse: 74 74 72 71  Resp: 15 16 14 14   Temp:    98.5 F (36.9 C)  TempSrc:    Oral  SpO2: 99% 99% 100% 98%  Weight:    46.7 kg  Height:       Eyes: PERRL, lids and conjunctivae normal ENMT: Mucous membranes are moist.  Neck: normal, supple Respiratory: clear to auscultation bilaterally, no wheezing, no crackles. Normal respiratory effort. No accessory muscle use.  Cardiovascular: Regular rate and rhythm, no murmurs / rubs / gallops. No extremity edema. Abdomen: no tenderness, no masses palpated.Bowel sounds positive.  Musculoskeletal: no clubbing / cyanosis. No joint deformity upper and lower extremities. Good ROM, no contractures. Normal muscle tone.  Skin: no rashes, lesions, ulcers. No induration Neurologic: Alert and oriented only to self.  CN 2-12 grossly intact. Sensation 3 out of 5 strength of bilateral lower extremity.  Per nursing she required 2 person assist to ambulate. Psychiatric: Normal judgment and insight. Alert and oriented x 3. Normal mood.   Labs on Admission: I have personally reviewed following labs and imaging  studies  CBC: Recent Labs  Lab 05/23/20 1714  WBC 5.3  NEUTROABS 4.0  HGB 10.5*  HCT 30.7*  MCV 89.5  PLT 431   Basic Metabolic Panel: Recent Labs  Lab 05/23/20 1714  NA 130*  K 4.5  CL 95*  CO2 22  GLUCOSE 111*  BUN 70*  CREATININE 4.20*  CALCIUM 8.5*   GFR: Estimated Creatinine Clearance: 8.3 mL/min (A) (by C-G formula based on SCr of 4.2 mg/dL (H)). Liver Function Tests: Recent Labs  Lab 05/23/20 1714  AST 14*  ALT 9  ALKPHOS 61  BILITOT 0.3  PROT 5.7*  ALBUMIN 3.3*   No results for input(s): LIPASE, AMYLASE in the last 168 hours. No results for input(s): AMMONIA in the last 168 hours. Coagulation Profile: No results for input(s): INR, PROTIME in the last 168 hours. Cardiac Enzymes: No results for input(s): CKTOTAL, CKMB, CKMBINDEX, TROPONINI in the last 168 hours. BNP (last 3 results) No results for input(s): PROBNP in the last 8760 hours. HbA1C: No results for input(s): HGBA1C in the last 72 hours. CBG: No results for input(s): GLUCAP in the last 168 hours. Lipid Profile: No results for input(s): CHOL, HDL, LDLCALC, TRIG, CHOLHDL, LDLDIRECT in the last 72 hours. Thyroid Function Tests: No results for input(s): TSH, T4TOTAL, FREET4, T3FREE, THYROIDAB in the last 72 hours. Anemia Panel: No results for input(s): VITAMINB12, FOLATE, FERRITIN, TIBC, IRON, RETICCTPCT in the last 72 hours. Urine analysis:    Component Value Date/Time   COLORURINE YELLOW 05/23/2020 2002   APPEARANCEUR HAZY (A) 05/23/2020 2002   LABSPEC 1.012 05/23/2020 2002   PHURINE 5.0 05/23/2020 2002   GLUCOSEU NEGATIVE 05/23/2020 2002   HGBUR NEGATIVE 05/23/2020 2002   [place] 05/23/2020 2002   [place] 05/23/2020 2002   PROTEINUR NEGATIVE 05/23/2020 2002   UROBILINOGEN 0.2 04/30/2014 2050   NITRITE NEGATIVE 05/23/2020 2002   LEUKOCYTESUR LARGE (A) 05/23/2020 2002    Radiological Exams on Admission: DG Chest Port 1 View  Result Date:  05/23/2020 CLINICAL DATA:  Shortness of breath and cough. EXAM: PORTABLE CHEST 1 VIEW COMPARISON:  08/26/2019 FINDINGS: 1654 hours. The lungs are clear without focal pneumonia, edema, pneumothorax or pleural effusion. Cardiopericardial silhouette is at upper limits of normal for size. Bones crash that Bones are diffusely demineralized. Telemetry leads overlie the chest. Right-sided Port-A-Cath again noted. IMPRESSION: No active disease. Electronically Signed   By: Misty Stanley M.D.   On: 05/23/2020 17:09      Assessment/Plan  Acute metabolic encephalopathy - Likely multifactorial from hypoxia from COPD exacerbation, UTI - Treatment plan as below  Acute hypoxic respiratory failure - Likely due to COPD exacerbation - lower suspicion for PE based on lung exam and cough - Scheduled DuoNeb - Daily prednisone - Maintain O2 greater than 92%  UTI - Continue IV Rocephin - Urine culture pending  AKI - Creatinine of 4.24 prior 1.26 - No urinary retention seen on bladder scan - Likely prerenal from decreased p.o. intake - Follow with continuous low-dose IV 75 cc/h fluid  Pancreatic adenocarcinoma -Not curable on palliative measures -Patient recently had discussion of CODE STATUS with her oncologist Dr. Burr Medico but family is not ready for her to be DNR/DNI.  She initially had plans for hospice to come to her house today -Could consider palliative care consult for further discussion with family   DVT prophylaxis:.Lovenox Code Status: Full Family Communication: No family  at bedside  disposition Plan: Home with at least 2 midnight stays  Consults called:  Admission status: inpatient  Level of care: Med-Surg  Status is: Inpatient  Remains inpatient appropriate because:Inpatient level of care appropriate due to severity of illness   Dispo: The patient is from: Home              Anticipated d/c is to: Home  Patient currently is not medically stable to d/c.   Difficult to  place patient No         Orene Desanctis DO [facility]   If 7PM-7AM, please contact night-coverage [email or web address]   05/24/2020, 2:02 AM

## 2020-05-24 NOTE — Evaluation (Signed)
Physical Therapy Evaluation Patient Details Name: Allison Vaughan MRN: [id number] DOB: 1945/01/25 Today's Date: 05/24/2020   History of Present Illness  75 yo female admitted with UTI, weakness, AMS. Hx of CHF, COPD, CAD, Afib, pancreatic cancer, chronic pain  Clinical Impression  On eval, pt required Total Assist +2 for mobility. Sat pt EOB for ~4-5 minutes with Mod A for static sitting balance. Multimodal cueing required throughout session. Slow processing observed. Pt presents very rigid throughout with upper body tremors observed. Very weak. No family present during session. Per chart review and pt report, she was able to ambulate and "do whatever I wanted to do" up until recently-no family present to corroborate. Will follow and progress activity as able. Unsure of d/c plan-home hospice vs SNF.     Follow Up Recommendations SNF    Equipment Recommendations  None recommended by PT    Recommendations for Other Services       Precautions / Restrictions Precautions Precautions: Fall Restrictions Weight Bearing Restrictions: No      Mobility  Bed Mobility Overal bed mobility: Needs Assistance Bed Mobility: Supine to Sit;Sit to Supine     Supine to sit: +2 for physical assistance;+2 for safety/equipment;Total assist Sit to supine: +2 for physical assistance;+2 for safety/equipment;Total assist   General bed mobility comments: Assist for trunk and bil LEs. Utilized bedpad. Multimodal cues. PT=0%. Mod A for static sitting balance with R lateral lean. Pt unable to self right or rotate head/neck    Transfers                    Ambulation/Gait                Stairs            Wheelchair Mobility    Modified Rankin (Stroke Patients Only)       Balance Overall balance assessment: Needs assistance   Sitting balance-Leahy Scale: Zero       Standing balance-Leahy Scale: Zero                               Pertinent Vitals/Pain  Pain Assessment: Faces Faces Pain Scale: Hurts even more Pain Location: R UE/hand; "all over" Pain Descriptors / Indicators: Grimacing;Moaning;Discomfort Pain Intervention(s): Limited activity within patient's tolerance;Monitored during session    Home Living Family/patient expects to be discharged to:: Private residence   Available Help at Discharge: Available 24 hours/day;Family;Friend(s) Type of Home: House Home Access: Level entry;Stairs to enter Entrance Stairs-Rails: None Entrance Stairs-Number of Steps: 2 Home Layout: One level Home Equipment: Environmental consultant - 2 wheels      Prior Function Level of Independence: Independent               Hand Dominance        Extremity/Trunk Assessment   Upper Extremity Assessment Upper Extremity Assessment: Defer to OT evaluation (appears rigid; tremors/fasciculations)    Lower Extremity Assessment Lower Extremity Assessment: RLE deficits/detail;LLE deficits/detail (Unable to fully assess 2* cognition. Appears rigid) RLE Coordination: decreased fine motor;decreased gross motor LLE Coordination: decreased fine motor;decreased gross motor    Cervical / Trunk Assessment Cervical / Trunk Assessment: Kyphotic  Communication   Communication: No difficulties  Cognition Arousal/Alertness: Awake/alert Behavior During Therapy: WFL for tasks assessed/performed Overall Cognitive Status: No family/caregiver present to determine baseline cognitive functioning Area of Impairment: Attention;Following commands;Safety/judgement;Problem solving  Following Commands: Follows one step commands inconsistently Safety/Judgement: Decreased awareness of deficits   Problem Solving: Slow processing;Decreased initiation;Difficulty sequencing;Requires verbal cues;Requires tactile cues        General Comments      Exercises General Exercises - Upper Extremity Shoulder Flexion: AROM;Both;5 reps (limited range) Elbow  Flexion: AAROM;Both;5 reps (limited range)   Assessment/Plan    PT Assessment Patient needs continued PT services  PT Problem List Decreased balance;Decreased activity tolerance;Decreased knowledge of use of DME;Decreased mobility;Decreased strength;Decreased cognition;Decreased coordination;Pain       PT Treatment Interventions DME instruction;Gait training;Therapeutic exercise;Balance training;Functional mobility training;Therapeutic activities;Patient/family education    PT Goals (Current goals can be found in the Care Plan section)  Acute Rehab PT Goals Patient Stated Goal: none stated. Pt stated " do you know why Im like this?" PT Goal Formulation: With patient Time For Goal Achievement: 06/07/20 Potential to Achieve Goals: Fair    Frequency Min 2X/week   Barriers to discharge        Co-evaluation               AM-PAC PT "6 Clicks" Mobility  Outcome Measure Help needed turning from your back to your side while in a flat bed without using bedrails?: Total Help needed moving from lying on your back to sitting on the side of a flat bed without using bedrails?: Total Help needed moving to and from a bed to a chair (including a wheelchair)?: Total Help needed standing up from a chair using your arms (e.g., wheelchair or bedside chair)?: Total Help needed to walk in hospital room?: Total Help needed climbing 3-5 steps with a railing? : Total 6 Click Score: 6    End of Session   Activity Tolerance: Patient limited by pain;Patient limited by fatigue Patient left: in bed;with call bell/phone within reach;with bed alarm set   PT Visit Diagnosis: Muscle weakness (generalized) (M62.81);Pain    Time: 1025-1055 PT Time Calculation (min) (ACUTE ONLY): 30 min   Charges:   PT Evaluation $PT Eval Moderate Complexity: 1 Mod PT Treatments $Therapeutic Activity: 8-22 mins          Doreatha Massed, PT Acute Rehabilitation  Office: [phone] Pager: [phone]

## 2020-05-25 LAB — CBC WITH DIFFERENTIAL/PLATELET
Abs Immature Granulocytes: 0.01 10*3/uL (ref 0.00–0.07)
Basophils Absolute: 0 10*3/uL (ref 0.0–0.1)
Basophils Relative: 0 %
Eosinophils Absolute: 0 10*3/uL (ref 0.0–0.5)
Eosinophils Relative: 0 %
HCT: 27.1 % — ABNORMAL LOW (ref 36.0–46.0)
Hemoglobin: 9.4 g/dL — ABNORMAL LOW (ref 12.0–15.0)
Immature Granulocytes: 0 %
Lymphocytes Relative: 5 %
Lymphs Abs: 0.1 10*3/uL — ABNORMAL LOW (ref 0.7–4.0)
MCH: 30.7 pg (ref 26.0–34.0)
MCHC: 34.7 g/dL (ref 30.0–36.0)
MCV: 88.6 fL (ref 80.0–100.0)
Monocytes Absolute: 0.1 10*3/uL (ref 0.1–1.0)
Monocytes Relative: 4 %
Neutro Abs: 2.1 10*3/uL (ref 1.7–7.7)
Neutrophils Relative %: 91 %
Platelets: 123 10*3/uL — ABNORMAL LOW (ref 150–400)
RBC: 3.06 MIL/uL — ABNORMAL LOW (ref 3.87–5.11)
RDW: 13 % (ref 11.5–15.5)
WBC: 2.3 10*3/uL — ABNORMAL LOW (ref 4.0–10.5)
nRBC: 0 % (ref 0.0–0.2)

## 2020-05-25 LAB — BASIC METABOLIC PANEL
Anion gap: 9 (ref 5–15)
BUN: 64 mg/dL — ABNORMAL HIGH (ref 8–23)
CO2: 21 mmol/L — ABNORMAL LOW (ref 22–32)
Calcium: 8.5 mg/dL — ABNORMAL LOW (ref 8.9–10.3)
Chloride: 102 mmol/L (ref 98–111)
Creatinine, Ser: 2.63 mg/dL — ABNORMAL HIGH (ref 0.44–1.00)
GFR, Estimated: 18 mL/min — ABNORMAL LOW (ref >60)
Glucose, Bld: 121 mg/dL — ABNORMAL HIGH (ref 70–99)
Potassium: 4 mmol/L (ref 3.5–5.1)
Sodium: 132 mmol/L — ABNORMAL LOW (ref 135–145)

## 2020-05-25 MED ORDER — ACETAMINOPHEN 325 MG PO TABS
650.0000 mg | ORAL_TABLET | Freq: Four times a day (QID) | ORAL | Status: DC | PRN
  Administered 2020-05-25 (×2): 650 mg via ORAL
  Filled 2020-05-25 (×2): qty 2

## 2020-05-25 MED ORDER — ALPRAZOLAM 1 MG PO TABS: 1.0000 mg | ORAL_TABLET | Freq: Every day | ORAL | Status: DC

## 2020-05-25 MED ORDER — OXYCODONE HCL 5 MG PO TABS
15.0000 mg | ORAL_TABLET | ORAL | Status: DC | PRN
  Administered 2020-05-25 (×2): 15 mg via ORAL
  Filled 2020-05-25 (×2): qty 3

## 2020-05-25 MED ORDER — ALPRAZOLAM 1 MG PO TABS
1.0000 mg | ORAL_TABLET | Freq: Two times a day (BID) | ORAL | Status: DC | PRN
  Administered 2020-05-25: 1 mg via ORAL
  Filled 2020-05-25: qty 1

## 2020-05-25 MED ORDER — LIP MEDEX EX OINT
1.0000 "application " | TOPICAL_OINTMENT | CUTANEOUS | Status: DC | PRN
  Filled 2020-05-25: qty 7

## 2020-05-25 MED ORDER — METOPROLOL SUCCINATE ER 50 MG PO TB24
50.0000 mg | ORAL_TABLET | Freq: Every day | ORAL | Status: DC
  Administered 2020-05-25 – 2020-05-26 (×2): 50 mg via ORAL
  Filled 2020-05-25 (×2): qty 1

## 2020-05-25 MED ORDER — SALINE SPRAY 0.65 % NA SOLN
1.0000 | NASAL | Status: DC | PRN
  Administered 2020-05-25 (×2): 1 via NASAL
  Filled 2020-05-25: qty 44

## 2020-05-25 NOTE — Progress Notes (Signed)
   05/25/20 8675  Assess: MEWS Score  Temp 98.6 F (37 C)  BP (!) 159/99  Pulse Rate (!) 111  Resp 18  SpO2 100 %  O2 Device Room Air  Assess: MEWS Score  MEWS Temp 0  MEWS Systolic 0  MEWS Pulse 2  MEWS RR 0  MEWS LOC 0  MEWS Score 2  MEWS Score Color Yellow  Assess: if the MEWS score is Yellow or Red  Were vital signs taken at a resting state? Yes  Focused Assessment No change from prior assessment  Does the patient meet 2 or more of the SIRS criteria? No  MEWS guidelines implemented *See Row Information* No, vital signs rechecked  Treat  MEWS Interventions Escalated (See documentation below)  Pain Scale 0-10  Pain Score 0  Take Vital Signs  Increase Vital Sign Frequency  Yellow: Q 2hr X 2 then Q 4hr X 2, if remains yellow, continue Q 4hrs  Escalate  MEWS: Escalate Yellow: discuss with charge nurse/RN and consider discussing with provider and RRT  Notify: Charge Nurse/RN  Name of Charge Nurse/RN Notified Caren Griffins  Date Charge Nurse/RN Notified 05/25/20  Time Charge Nurse/RN Notified 0700  Document  Patient Outcome Other (Comment) (Patient stable at this time; patient has history of Afib & upon assessment patient's heart rhythm is irregular and does drop below 110 during reassessment)  Progress note created (see row info) Yes  Assess: SIRS CRITERIA  SIRS Temperature  0  SIRS Pulse 1  SIRS Respirations  0  SIRS WBC 0  SIRS Score Sum  1

## 2020-05-25 NOTE — Progress Notes (Signed)
Initial Nutrition Assessment  INTERVENTION:   -Magic cup BID with meals, each supplement provides 290 kcal and 9 grams of protein  NUTRITION DIAGNOSIS:   Inadequate oral intake related to cancer and cancer related treatments as evidenced by per patient/family report.  GOAL:   Patient will meet greater than or equal to 90% of their needs  MONITOR:   PO intake,Supplement acceptance,Labs,Weight trends,I & O's  REASON FOR ASSESSMENT:   Malnutrition Screening Tool    ASSESSMENT:   75 year old female past medical history for pancreatic adenocarcinoma, currently on palliative treatment, she also has hypertension, atrial fibrillation, chronic diastolic heart failure, COPD, history of prolonged QT and GERD.admitted to the hospital with working diagnosis of acute metabolic encephalopathy and acute kidney injury in the setting of urinary tract infection.  Patient not a good historian, alert/oriented x 2.  Plan is now for pt to discharge with hospice.  Pt consuming 20% of meals. Will order Magic cups with meals.  Per weight records, pt has lost 12 lbs since 09/24/19 (10% wt loss x 8 months, insignificant for time frame).  Medications reviewed.  Labs reviewed:  Low Na  NUTRITION - FOCUSED PHYSICAL EXAM:  Deferred.  Diet Order:   Diet Order            Diet Heart Room service appropriate? Yes; Fluid consistency: Thin  Diet effective now                 EDUCATION NEEDS:   No education needs have been identified at this time  Skin:  Skin Assessment: Reviewed RN Assessment  Last BM:  PTA  Height:   Ht Readings from Last 1 Encounters:  05/23/20 5\' 2"  (1.575 m)    Weight:   Wt Readings from Last 1 Encounters:  05/24/20 46.7 kg   BMI:  Body mass index is 18.83 kg/m.  Estimated Nutritional Needs:   Kcal:  1150-1350  Protein:  60-70g  Fluid:  1.4L/day  Clayton Bibles, MS, RD, LDN Inpatient Clinical Dietitian Contact information available via Amion

## 2020-05-25 NOTE — Progress Notes (Signed)
Daily Progress Note   Patient Name: Allison Vaughan       Date: 05/25/2020 DOB: 01-16-1945  Age: 75 y.o. MRN#: [id number] Attending Physician: Shawna Clamp, MD Primary Care Physician: Sandi Mariscal, MD Admit Date: 05/23/2020  Reason for Consultation/Follow-up: Establishing goals of care  Subjective:  patient on the phone, calling her daughter, is tearful at times, has episodic anxiety as well, discussed with bedside RN, medication history reviewed, call placed and updated daughter Allison Vaughan as well. See below.   Length of Stay: 2  Current Medications: Scheduled Meds:  . ALPRAZolam  1 mg Oral QHS  . chlorhexidine  15 mL Mouth Rinse BID  . Chlorhexidine Gluconate Cloth  6 each Topical Daily  . fentaNYL  1 patch Transdermal Q72H  . FLUoxetine  40 mg Oral QPM  . heparin  5,000 Units Subcutaneous Q8H  . ipratropium-albuterol  3 mL Nebulization TID  . mouth rinse  15 mL Mouth Rinse q12n4p  . pantoprazole  40 mg Oral Daily  . sodium chloride flush  10-40 mL Intracatheter Q12H    Continuous Infusions: . sodium chloride 75 mL/hr at 05/25/20 0537  . cefTRIAXone (ROCEPHIN)  IV 1 g (05/24/20 2119)    PRN Meds: acetaminophen, albuterol, oxyCODONE, sodium chloride, sodium chloride flush  Physical Exam         Appears frail and with chronic weakness Regular work of breathing No edema Mild distress Abdomen not distended No edema  Vital Signs: BP (!) 159/87 (BP Location: Left Arm)   Pulse (!) 127   Temp 97.8 F (36.6 C) (Oral)   Resp 15   Ht 5\' 2"  (1.575 m)   Wt 46.7 kg   SpO2 98%   BMI 18.83 kg/m  SpO2: SpO2: 98 % O2 Device: O2 Device: Room Air O2 Flow Rate: O2 Flow Rate (L/min): 2 L/min  Intake/output summary:   Intake/Output Summary (Last 24 hours) at 05/25/2020 0949 Last  data filed at 05/25/2020 3235 Gross per 24 hour  Intake 120 ml  Output 1100 ml  Net -980 ml   LBM: Last BM Date:  (unknown; prior to admission) Baseline Weight: Weight: 49.9 kg Most recent weight: Weight: 46.7 kg       Palliative Assessment/Data:      Patient Active Problem List   Diagnosis Date Noted  . Acute  respiratory failure with hypoxia ([place]) 05/24/2020  . Pancreatic adenocarcinoma ([place]) 05/24/2020  . UTI (urinary tract infection) 05/23/2020  . Genetic testing 09/21/2019  . DNR (do not resuscitate)   . Advanced care planning/counseling discussion   . Palliative care by specialist   . Acute on chronic diastolic CHF (congestive heart failure) ([place]) 08/24/2019  . Acute on chronic diastolic (congestive) heart failure ([place]) 08/24/2019  . COPD exacerbation ([place]) 08/13/2019  . Acute CHF (congestive heart failure) ([place]) 08/13/2019  . Hypertensive urgency 08/13/2019  . Allergy reaction, arthritis 08/09/2019  . Port-A-Cath in place 07/30/2019  . Goals of care, counseling/discussion 07/13/2019  . 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[place]) 07/12/2019  . Pancreatic neoplasm 06/24/2019  . Prolonged QT interval 12/22/2018  . Hypomagnesemia 12/22/2018  . Constipation 12/22/2018  . Dilation of pancreatic duct 12/22/2018  . Altered mental status 07/09/2018  . Acute lower UTI 07/09/2018  . Anemia 07/09/2018  . Bronchiolitis 07/09/2018  . Bronchitis 07/09/2018  . Acute metabolic encephalopathy 02/72/5366  . Contusion of right wrist   . Fall   . Bacteria in urine   . Mild renal insufficiency 03/26/2018  . 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 Atrial fibrillation with RVR ([place]) 03/01/2017  . Acute on chronic respiratory failure with hypoxia ([place]) 03/01/2017  . Fall at home, initial encounter 03/01/2017  . Accidental drug overdose   . Acute respiratory failure with hypercapnia ([place]) 01/22/2017  . Acute encephalopathy   . AKI (acute kidney injury) ([place]) 04/30/2014  .  Pulmonary nodule 04/30/2014  . Essential hypertension 04/30/2014  . Hypokalemia 04/30/2014  . Hyponatremia 04/30/2014  . Hypotension 04/30/2014  . Nausea & vomiting 04/30/2014  . Lung nodule 04/16/2014  . Chronic hypoxemic respiratory failure ([place]) 06/26/2012  . COPD (chronic obstructive pulmonary disease) ([place]) 06/26/2012  . HYPERCHOLESTEROLEMIA 03/13/2006  . TOBACCO DEPENDENCE 03/13/2006  . Mood disorder ([place]) 03/13/2006  . HYPERTENSION, BENIGN SYSTEMIC 03/13/2006  . GASTROESOPHAGEAL REFLUX, NO ESOPHAGITIS 03/13/2006  . IRRITABLE BOWEL SYNDROME 03/13/2006  . MENOPAUSAL SYNDROME 03/13/2006  . OSTEOARTHRITIS, MULTI SITES 03/13/2006    Palliative Care Assessment & Plan   Patient Profile:    Assessment:  Ms. Allison Vaughan is 75 years old.  She lives at home with [name] here in [place], [place].  She is under the care of Dr. Burr Vaughan for pancreatic adenocarcinoma for which she was recently to be referred to hospice services.  Also has underlying hypertension atrial fibrillation chronic diastolic heart failure COPD gastroesophageal reflux disease.  Patient admitted to hospital medicine service with acute metabolic encephalopathy in the setting of urinary tract infection, acute hypoxic respiratory failure, acute kidney injury in the setting of stage III chronic kidney disease with underlying life limiting illness of pancreatic adenocarcinoma.  Patient started on IV fluids and antibiotics.  Hospice consultation has been completed here in the hospital.  Palliative consultation for additional goals of care discussions has been requested as well as for adequate pain and on pain symptom management options.  Recommendations/Plan: Continue foley for comfort Home with  Hospice once arrangements complete Resume PO PRN opioids, also started on low dose trans dermal fentanyl  Modify home xanax for anxiety management    Code Status:    Code Status Orders  (From admission, onward)          Start     Ordered   05/24/20 1633  Do not attempt resuscitation (DNR)  Continuous       Question Answer Comment  In the event  of cardiac or respiratory ARREST Do not call a "code blue"   In the event of cardiac or respiratory ARREST Do not perform Intubation, CPR, defibrillation or ACLS   In the event of cardiac or respiratory ARREST Use medication by any route, position, wound care, and other measures to relive pain and suffering. May use oxygen, suction and manual treatment of airway obstruction as needed for comfort.      05/24/20 1633        Code Status History    Date Active Date Inactive Code Status Order ID Comments User Context   05/23/2020 2220 05/24/2020 1633 Full Code [id number]  Orene Desanctis, DO ED   08/27/2019 1153 08/29/2019 1714 DNR [id number]  Mitzi Hansen, MD Inpatient   08/24/2019 2319 08/27/2019 1153 Full Code [id number]  Neena Rhymes, MD ED   08/13/2019 1847 08/15/2019 1814 Full Code [id number]  Harold Hedge, MD Inpatient   12/22/2018 0143 12/26/2018 1559 Full Code [id number]  [name], MD ED   07/09/2018 0444 07/09/2018 2001 Full Code [id number]  [name], MD Inpatient   03/26/2018 0124 03/28/2018 1753 Full Code [id number]  Vianne Bulls, MD ED   03/02/2017 0147 03/03/2017 1934 Full Code [id number]  Toy Baker, MD ED   01/22/2017 1147 01/24/2017 1446 Full Code [id number]  Minor, Grace Bushy, NP ED   04/30/2014 2152 05/02/2014 1511 Full Code [id number]  Lavina Hamman, MD ED   04/16/2014 0255 04/20/2014 2004 Full Code [id number]  Rise Patience, MD Inpatient   06/26/2012 0104 06/27/2012 1538 Full Code [id number]  Etta Quill., DO ED   06/06/2012 2238 06/09/2012 1521 Full Code [id number]  Theodis Blaze, MD ED   Advance Care Planning Activity      Prognosis:  < 6 months  Discharge Planning: Home with Hospice  Care plan was discussed with  Patient's daughter Allison Vaughan on the phone.   Thank you for allowing the Palliative Medicine Team to assist in the care of  this patient.   Time In: 9 Time Out: 9.25  Total Time 25 Prolonged Time Billed No       Greater than 50%  of this time was spent counseling and coordinating care related to the above assessment and plan.  Loistine Chance, MD  Please contact Palliative Medicine Team phone at [phone] for questions and concerns.

## 2020-05-25 NOTE — Evaluation (Signed)
Occupational Therapy Evaluation Patient Details Name: Allison Vaughan MRN: [id number] DOB: 07-01-1945 Today's Date: 05/25/2020    History of Present Illness 75 yo female admitted with UTI, weakness, AMS. Hx of CHF, COPD, CAD, Afib, pancreatic cancer, chronic pain   Clinical Impression   Pt admitted with the above diagnoses and presents with below problem list. Pt will benefit from continued acute OT to address the below listed deficits and maximize independence with basic ADLs. PTA pt reports she was independent-mod I with basic ADLs. Friend gets her groceries and does meal prep. No family present to confirm and pt is questionable historian. Pt currently needs up to +2 mod A for safety with LB ADLs and stand-pivot transfers to [facility]. Impaired cognition, unclear baseline with no family present on OT eval.      Follow Up Recommendations  Other (comment);Supervision/Assistance - 24 hour (Per chart review plan is home with hospice services.)    Equipment Recommendations  Wheelchair (measurements OT);Wheelchair cushion (measurements OT);Hospital bed;3 in 1 bedside commode    Recommendations for Other Services       Precautions / Restrictions Precautions Precautions: Fall Restrictions Weight Bearing Restrictions: No      Mobility Bed Mobility               General bed mobility comments: up in recliner at start and end of session    Transfers Overall transfer level: Needs assistance Equipment used: Rolling walker (2 wheeled) Transfers: Sit to/from Stand Sit to Stand: Min assist;+2 safety/equipment         General transfer comment: steadying assist. +2 for safety this session 2/2 impaired cognition. to/from recliner. Cues for hand placement/technique with rw.    Balance Overall balance assessment: Needs assistance   Sitting balance-Leahy Scale: Fair Sitting balance - Comments: sat in unsupported seating position with single-bilateral UE support. no physical assist.    Standing balance support: Bilateral upper extremity supported Standing balance-Leahy Scale: Poor Standing balance comment: rw and min A in standing                           ADL either performed or assessed with clinical judgement   ADL Overall ADL's : Needs assistance/impaired Eating/Feeding: Set up;Sitting Eating/Feeding Details (indicate cue type and reason): pt endorses poor appetite. Discussed smaller meals with increased frequency, Grooming: Moderate assistance;Sitting;Maximal assistance   Upper Body Bathing: Sitting;Moderate assistance   Lower Body Bathing: Moderate assistance;+2 for safety/equipment;Sit to/from stand   Upper Body Dressing : Moderate assistance;Sitting   Lower Body Dressing: Moderate assistance;+2 for safety/equipment;Sit to/from stand   Toilet Transfer: +2 for safety/equipment;Stand-pivot;Minimal assistance   Toileting- Clothing Manipulation and Hygiene: Moderate assistance;+2 for safety/equipment;Sit to/from stand         General ADL Comments: Pt receieved in recliner. Able to progress from standing to weightshifting to walking about 3'. When asked, pt reported not feeling well, cued to sit down.     Vision         Perception     Praxis      Pertinent Vitals/Pain Pain Assessment: Faces Faces Pain Scale: Hurts even more Pain Location: R UE/hand; "all over" Pain Descriptors / Indicators: Grimacing;Moaning;Discomfort Pain Intervention(s): Limited activity within patient's tolerance;Monitored during session;Heat applied;Repositioned     Hand Dominance Right   Extremity/Trunk Assessment Upper Extremity Assessment Upper Extremity Assessment: RUE deficits/detail;LUE deficits/detail;Generalized weakness RUE Deficits / Details: wrist edema noted. Pt reports this is new. No family present to confirm. LUE Deficits /  Details: wrist edema noted. Pt reports this is new. No family present to confirm.   Lower Extremity Assessment Lower  Extremity Assessment: Defer to PT evaluation   Cervical / Trunk Assessment Cervical / Trunk Assessment: Kyphotic   Communication Communication Communication: No difficulties   Cognition Arousal/Alertness: Awake/alert Behavior During Therapy: WFL for tasks assessed/performed (pleasantly confused) Overall Cognitive Status: No family/caregiver present to determine baseline cognitive functioning Area of Impairment: Orientation;Attention;Following commands;Memory;Safety/judgement;Problem solving                 Orientation Level: Place;Time ("2007") Current Attention Level: Sustained Memory: Decreased short-term memory Following Commands: Follows one step commands inconsistently (followed 75% of one-step commands) Safety/Judgement: Decreased awareness of deficits   Problem Solving: Slow processing;Decreased initiation;Difficulty sequencing;Requires verbal cues     General Comments       Exercises Exercises: Other exercises Other Exercises Other Exercises: encouraged composite flexion/extension exercises for edema control.   Shoulder Instructions      Home Living Family/patient expects to be discharged to:: Private residence Living Arrangements: Other (Comment);Non-relatives/Friends (daughter. Jolaine Artist gets her groceries and does meal prep) Available Help at Discharge: Available 24 hours/day;Family;Friend(s) Type of Home: House Home Access: Level entry;Stairs to enter Entrance Stairs-Number of Steps: 2 Entrance Stairs-Rails: None Home Layout: One level     Bathroom Shower/Tub: Walk-in shower;Tub/shower unit   Bathroom Toilet: Standard     Home Equipment: Walker - 2 wheels          Prior Functioning/Environment Level of Independence: Independent        Comments: pt reports she is independent with basic ADLs. Not sure if pt was using AD PTA. No family present.        OT Problem List: Decreased strength;Decreased activity tolerance;Impaired balance  (sitting and/or standing);Decreased cognition;Decreased safety awareness;Decreased knowledge of use of DME or AE;Decreased knowledge of precautions;Cardiopulmonary status limiting activity;Pain;Increased edema      OT Treatment/Interventions: Self-care/ADL training;Therapeutic exercise;Energy conservation;DME and/or AE instruction;Therapeutic activities;Cognitive remediation/compensation;Patient/family education;Balance training    OT Goals(Current goals can be found in the care plan section) Acute Rehab OT Goals Patient Stated Goal: none stated OT Goal Formulation: With patient Time For Goal Achievement: 06/08/20 Potential to Achieve Goals: Fair ADL Goals Pt Will Perform Upper Body Dressing: with set-up;sitting;with min assist Pt Will Perform Lower Body Dressing: with min assist;sitting/lateral leans;sit to/from stand Pt Will Transfer to Toilet: with min assist;stand pivot transfer;bedside commode Pt Will Perform Toileting - Clothing Manipulation and hygiene: with min assist;sitting/lateral leans;sit to/from stand Additional ADL Goal #1: Pt will complete bed mobility at min A level to prepare for EOB/OOB ADLs.  OT Frequency: Min 2X/week   Barriers to D/C:            Co-evaluation              AM-PAC OT "6 Clicks" Daily Activity     Outcome Measure Help from another person eating meals?: A Little Help from another person taking care of personal grooming?: A Little Help from another person toileting, which includes using toliet, bedpan, or urinal?: A Lot Help from another person bathing (including washing, rinsing, drying)?: A Lot Help from another person to put on and taking off regular upper body clothing?: A Little Help from another person to put on and taking off regular lower body clothing?: A Lot 6 Click Score: 15   End of Session Equipment Utilized During Treatment: Rolling walker;Other (comment) (chair follow to walk a few feet) Nurse Communication: Mobility  status;Precautions;Other (comment) (cognition)  Activity Tolerance: Other (comment) (pt r/o head "feeling funny, I don't know how to describe it." Appeared lightheaded. Cued to sit down when this worsened while walking a  few feet.) Patient left: in chair;with call bell/phone within reach;with chair alarm set  OT Visit Diagnosis: Other abnormalities of gait and mobility (R26.89);Unsteadiness on feet (R26.81);Muscle weakness (generalized) (M62.81);Other symptoms and signs involving cognitive function;Pain                Time: 1043-1105 OT Time Calculation (min): 22 min Charges:  OT General Charges $OT Visit: 1 Visit OT Evaluation $OT Eval Moderate Complexity: [place], OT Acute Rehabilitation Services Pager: [phone] Office: [phone]   Hortencia Pilar 05/25/2020, 1:23 PM

## 2020-05-25 NOTE — Progress Notes (Signed)
PROGRESS NOTE    Allison Vaughan  CHY:[id number] DOB: 28-Apr-1945 DOA: 05/23/2020 PCP: Sandi Mariscal, MD    Brief Narrative: This 75 years old female with PMH significant for pancreatic adenocarcinoma, currently on palliative treatment, hypertension, A. fib, chronic diastolic CHF, COPD, history of prolonged QT and GERD.  Patient was unable to give detailed history.  Patient was found to be hypoxic on the day of hospitalization.  Apparently she was evaluated as an outpatient for hospice services. Patient was admitted for acute metabolic encephalopathy in the setting of UTI.  Palliative care was consulted for goals of care discussion.   Assessment & Plan:   Active Problems:   AKI (acute kidney injury) ([place])   Acute metabolic encephalopathy   UTI (urinary tract infection)   Acute respiratory failure with hypoxia (HCC)   Pancreatic adenocarcinoma (HCC)   Acute metabolic encephalopathy, in the setting of urinary tract infection (present on admission, no sepsis).  Patient is awake and alert, continue to be confused but not agitated, not clear about her baseline.  Continue supportive medical care with IV fluids and neuro checks. Continue ceftriaxone.  Out of bed as tolerated, will talk to her daughter about hospice.   Acute hypoxemic respiratory failure:  likely due to atelectasis, clinically no signs of COPD exacerbation. Will hold on steroids, continue with as needed bronchodilators.  Supplemental 02 per [place] as needed.  Encourage incentive spirometer.   AKI on CKD stage 3a :> Improving  Pre-renal AKI, renal function today with serum cr at 2.63 with bun 64. K is 3.9 and serum Na is 132. Continue hydration with isotonic saline and follow up on renal function in am. Avoid hypotension and nephrotoxic medications.   Pancreatic adenocarcinoma.  Not candidate to palliative treatment, possible hospice. Palliative care team was consulted, and agreed for home with hospice. Continue foley  for comfort.  Depression. Continue with fluoxetine.   Hyponatremia :  Improving Could be multifactorial, continue gentle iv hydration  Pancytopenia:  This could be due to pancreatic ca, moniter CBC.  DVT prophylaxis: Heparin Code Status: DNR Family Communication:  No family at bed side. Disposition Plan:  Status is: Inpatient  Remains inpatient appropriate because:Inpatient level of care appropriate due to severity of illness   Dispo: The patient is from: Home              Anticipated d/c is to: Group home              Patient currently is not medically stable to d/c.   Difficult to place patient No    Consultants:    Palliative care.   Procedures:  None  Antimicrobials:   Anti-infectives (From admission, onward)   Start     Dose/Rate Route Frequency Ordered Stop   05/24/20 2200  cefTRIAXone (ROCEPHIN) 1 g in sodium chloride 0.9 % 100 mL IVPB        1 g 200 mL/hr over 30 Minutes Intravenous Every 24 hours 05/23/20 2221     05/23/20 2115  cefTRIAXone (ROCEPHIN) 1 g in sodium chloride 0.9 % 100 mL IVPB        1 g 200 mL/hr over 30 Minutes Intravenous  Once 05/23/20 2102 05/23/20 2251      Subjective: Patient was seen and examined at bedside.  Overnight events noted.  She was sitting comfortably on the chair,  reported pain while having urination but otherwise denies any shortness of breath or chest pain.  Objective: Vitals:   05/25/20 8786 05/25/20 7672  05/25/20 0735 05/25/20 0945  BP: (!) 159/99   (!) 159/87  Pulse: (!) 111  (!) 108 (!) 127  Resp: 18   15  Temp: 98.6 F (37 C)   97.8 F (36.6 C)  TempSrc: Oral   Oral  SpO2: 100% 95% 98% 98%  Weight:      Height:        Intake/Output Summary (Last 24 hours) at 05/25/2020 1500 Last data filed at 05/25/2020 0940 Gross per 24 hour  Intake 130 ml  Output 600 ml  Net -470 ml   Filed Weights   05/23/20 1429 05/23/20 1432 05/24/20 0059  Weight: 49.9 kg 49 kg 46.7 kg    Examination:  General exam:  Appears calm and comfortable, not in any acute distress. Respiratory system: Clear to auscultation. Respiratory effort normal. Cardiovascular system: S1 & S2 heard, RRR. No JVD, murmurs, rubs, gallops or clicks. No pedal edema. Gastrointestinal system: Abdomen is nondistended, soft and nontender. No organomegaly or masses felt. Normal bowel sounds heard. Central nervous system: Alert and oriented. No focal neurological deficits. Extremities: Symmetric 5 x 5 power. No edema, no cyanosis, no clubbing. Skin: No rashes, lesions or ulcers Psychiatry: Judgement and insight appear normal. Mood & affect appropriate.     Data Reviewed: I have personally reviewed following labs and imaging studies  CBC: Recent Labs  Lab 05/23/20 1714 05/24/20 0520 05/25/20 0518  WBC 5.3 2.8* 2.3*  NEUTROABS 4.0  --  2.1  HGB 10.5* 9.5* 9.4*  HCT 30.7* 27.7* 27.1*  MCV 89.5 90.2 88.6  PLT 160 114* 123*   Basic Metabolic Panel: Recent Labs  Lab 05/23/20 1714 05/24/20 0520 05/25/20 0518  NA 130* 132* 132*  K 4.5 3.9 4.0  CL 95* 100 102  CO2 22 25 21*  GLUCOSE 111* 90 121*  BUN 70* 66* 64*  CREATININE 4.20* 3.43* 2.63*  CALCIUM 8.5* 8.1* 8.5*   GFR: Estimated Creatinine Clearance: 13.2 mL/min (A) (by C-G formula based on SCr of 2.63 mg/dL (H)). Liver Function Tests: Recent Labs  Lab 05/23/20 1714  AST 14*  ALT 9  ALKPHOS 61  BILITOT 0.3  PROT 5.7*  ALBUMIN 3.3*   No results for input(s): LIPASE, AMYLASE in the last 168 hours. No results for input(s): AMMONIA in the last 168 hours. Coagulation Profile: No results for input(s): INR, PROTIME in the last 168 hours. Cardiac Enzymes: No results for input(s): CKTOTAL, CKMB, CKMBINDEX, TROPONINI in the last 168 hours. BNP (last 3 results) No results for input(s): PROBNP in the last 8760 hours. HbA1C: No results for input(s): HGBA1C in the last 72 hours. CBG: No results for input(s): GLUCAP in the last 168 hours. Lipid Profile: No results  for input(s): CHOL, HDL, LDLCALC, TRIG, CHOLHDL, LDLDIRECT in the last 72 hours. Thyroid Function Tests: No results for input(s): TSH, T4TOTAL, FREET4, T3FREE, THYROIDAB in the last 72 hours. Anemia Panel: No results for input(s): VITAMINB12, FOLATE, FERRITIN, TIBC, IRON, RETICCTPCT in the last 72 hours. Sepsis Labs: Recent Labs  Lab 05/23/20 1714  LATICACIDVEN 0.7    Recent Results (from the past 240 hour(s))  Resp Panel by RT-PCR (Flu A&B, Covid) Nasopharyngeal Swab     Status: None   Collection Time: 05/23/20  5:14 PM   Specimen: Nasopharyngeal Swab; Nasopharyngeal(NP) swabs in vial transport medium  Result Value Ref Range Status   SARS Coronavirus 2 by RT PCR NEGATIVE NEGATIVE Final    Comment: (NOTE) SARS-CoV-2 target nucleic acids are NOT DETECTED.  The SARS-CoV-2  RNA is generally detectable in upper respiratory specimens during the acute phase of infection. The lowest concentration of SARS-CoV-2 viral copies this assay can detect is 138 copies/mL. A negative result does not preclude SARS-Cov-2 infection and should not be used as the sole basis for treatment or other patient management decisions. A negative result may occur with  improper specimen collection/handling, submission of specimen other than nasopharyngeal swab, presence of viral mutation(s) within the areas targeted by this assay, and inadequate number of viral copies(<138 copies/mL). A negative result must be combined with clinical observations, patient history, and epidemiological information. The expected result is Negative.  Fact Sheet for Patients:  EntrepreneurPulse.com.au  Fact Sheet for Healthcare Providers:  IncredibleEmployment.be  This test is no t yet approved or cleared by the [place] FDA and  has been authorized for detection and/or diagnosis of SARS-CoV-2 by FDA under an Emergency Use Authorization (EUA). This EUA will remain  in effect (meaning this  test can be used) for the duration of the COVID-19 declaration under Section 564(b)(1) of the Act, 21 U.S.C.section 360bbb-3(b)(1), unless the authorization is terminated  or revoked sooner.       Influenza A by PCR NEGATIVE NEGATIVE Final   Influenza B by PCR NEGATIVE NEGATIVE Final    Comment: (NOTE) The Xpert Xpress SARS-CoV-2/FLU/RSV plus assay is intended as an aid in the diagnosis of influenza from Nasopharyngeal swab specimens and should not be used as a sole basis for treatment. Nasal washings and aspirates are unacceptable for Xpert Xpress SARS-CoV-2/FLU/RSV testing.  Fact Sheet for Patients: EntrepreneurPulse.com.au  Fact Sheet for Healthcare Providers: IncredibleEmployment.be  This test is not yet approved or cleared by the [place] FDA and has been authorized for detection and/or diagnosis of SARS-CoV-2 by FDA under an Emergency Use Authorization (EUA). This EUA will remain in effect (meaning this test can be used) for the duration of the COVID-19 declaration under Section 564(b)(1) of the Act, 21 U.S.C. section 360bbb-3(b)(1), unless the authorization is terminated or revoked.  Performed at [facility], [place] [address] [place], [place] [address]   Urine culture     Status: Abnormal (Preliminary result)   Collection Time: 05/23/20  8:02 PM   Specimen: Urine, Random  Result Value Ref Range Status   Specimen Description   Final    URINE, RANDOM Performed at [place] [address], [place], [place] [address]    Special Requests   Final    NONE Performed at [facility], [place] [address], [place], [place] [address]    Culture (A)  Final    >=100,000 COLONIES/mL GRAM NEGATIVE RODS >=100,000 COLONIES/mL ESCHERICHIA COLI SUSCEPTIBILITIES TO FOLLOW Performed at [place] [address], [place], [place] [address]    Report Status PENDING  Incomplete     Radiology Studies: DG Chest Port 1 View  Result Date: 05/23/2020 CLINICAL DATA:  Shortness of breath and cough. EXAM: PORTABLE CHEST 1 VIEW COMPARISON:  08/26/2019 FINDINGS: 1654 hours. The lungs are clear without focal pneumonia, edema, pneumothorax or pleural effusion. Cardiopericardial silhouette is at upper limits of normal for size. Bones crash that Bones are diffusely demineralized. Telemetry leads overlie the chest. Right-sided Port-A-Cath again noted. IMPRESSION: No active disease. Electronically Signed   By: Misty Stanley M.D.   On: 05/23/2020 17:09   Scheduled Meds: . chlorhexidine  15 mL Mouth Rinse BID  . Chlorhexidine Gluconate Cloth  6 each Topical Daily  . fentaNYL  1 patch  Transdermal Q72H  . FLUoxetine  40 mg Oral QPM  . heparin  5,000 Units Subcutaneous Q8H  . ipratropium-albuterol  3 mL Nebulization TID  . mouth rinse  15 mL Mouth Rinse q12n4p  . metoprolol succinate  50 mg Oral Q breakfast  . pantoprazole  40 mg Oral Daily  . sodium chloride flush  10-40 mL Intracatheter Q12H   Continuous Infusions: . sodium chloride 75 mL/hr at 05/25/20 0537  . cefTRIAXone (ROCEPHIN)  IV 1 g (05/24/20 2119)     LOS: 2 days    Time spent: 35 mins    Disaya Walt, MD [facility]   If 7PM-7AM, please contact night-coverage

## 2020-05-25 NOTE — TOC Initial Note (Signed)
Transition of Care [facility]) - Initial/Assessment Note    Patient Details  Name: Allison Vaughan MRN: [id number] Date of Birth: Jan 14, 1946  Transition of Care [facility]) CM/SW Contact:    Lynnell Catalan, RN Phone Number: 05/25/2020, 12:58 PM  Clinical Narrative:                 PMT referral requested by [facility] hospice liaison. Attending made aware of request. TOC will continue to follow.     Activities of Daily Living Home Assistive Devices/Equipment: Gilford Rile (specify type),Eyeglasses ADL Screening (condition at time of admission) Patient's cognitive ability adequate to safely complete daily activities?: No Is the patient deaf or have difficulty hearing?: Yes Does the patient have difficulty seeing, even when wearing glasses/contacts?: No Does the patient have difficulty concentrating, remembering, or making decisions?: Yes Patient able to express need for assistance with ADLs?: Yes Does the patient have difficulty dressing or bathing?: Yes Independently performs ADLs?: No Communication: Independent Dressing (OT): Needs assistance Is this a change from baseline?: Pre-admission baseline Grooming: Needs assistance Is this a change from baseline?: Pre-admission baseline Feeding: Needs assistance Is this a change from baseline?: Pre-admission baseline Bathing: Needs assistance Is this a change from baseline?: Pre-admission baseline Toileting: Needs assistance Is this a change from baseline?: Pre-admission baseline In/Out Bed: Needs assistance Is this a change from baseline?: Pre-admission baseline Walks in Home: Needs assistance Is this a change from baseline?: Pre-admission baseline Does the patient have difficulty walking or climbing stairs?: Yes Weakness of Legs: Both Weakness of Arms/Hands: Both  Permission Sought/Granted                  Emotional Assessment              Admission diagnosis:  UTI (urinary tract infection) [N39.0] Hypoxia [R09.02] Acute  UTI [N39.0] Patient Active Problem List   Diagnosis Date Noted  . Acute respiratory failure with hypoxia ([place]) 05/24/2020  . Pancreatic adenocarcinoma ([place]) 05/24/2020  . UTI (urinary tract infection) 05/23/2020  . Genetic testing 09/21/2019  . DNR (do not resuscitate)   . Advanced care planning/counseling discussion   . Palliative care by specialist   . Acute on chronic diastolic CHF (congestive heart failure) ([place]) 08/24/2019  . Acute on chronic diastolic (congestive) heart failure ([place]) 08/24/2019  . COPD exacerbation ([place]) 08/13/2019  . Acute CHF (congestive heart failure) ([place]) 08/13/2019  . Hypertensive urgency 08/13/2019  . Allergy reaction, arthritis 08/09/2019  . Port-A-Cath in place 07/30/2019  . Goals of care, counseling/discussion 07/13/2019  . 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[place]) 07/12/2019  . Pancreatic neoplasm 06/24/2019  . Prolonged QT interval 12/22/2018  . Hypomagnesemia 12/22/2018  . Constipation 12/22/2018  . Dilation of pancreatic duct 12/22/2018  . Altered mental status 07/09/2018  . Acute lower UTI 07/09/2018  . Anemia 07/09/2018  . Bronchiolitis 07/09/2018  . Bronchitis 07/09/2018  . Acute metabolic encephalopathy 40/98/1191  . Contusion of right wrist   . Fall   . Bacteria in urine   . Mild renal insufficiency 03/26/2018  . 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 Atrial fibrillation with RVR ([place]) 03/01/2017  . Acute on chronic respiratory failure with hypoxia ([place]) 03/01/2017  . Fall at home, initial encounter 03/01/2017  . Accidental drug overdose   . Acute respiratory failure with hypercapnia ([place]) 01/22/2017  . Acute encephalopathy   . AKI (acute kidney injury) ([place]) 04/30/2014  . Pulmonary nodule 04/30/2014  . Essential hypertension 04/30/2014  . Hypokalemia 04/30/2014  . Hyponatremia  04/30/2014  . Hypotension 04/30/2014  . Nausea & vomiting 04/30/2014  . Lung nodule 04/16/2014  . Chronic hypoxemic respiratory failure ([place])  06/26/2012  . COPD (chronic obstructive pulmonary disease) ([place]) 06/26/2012  . HYPERCHOLESTEROLEMIA 03/13/2006  . TOBACCO DEPENDENCE 03/13/2006  . Mood disorder ([place]) 03/13/2006  . HYPERTENSION, BENIGN SYSTEMIC 03/13/2006  . GASTROESOPHAGEAL REFLUX, NO ESOPHAGITIS 03/13/2006  . IRRITABLE BOWEL SYNDROME 03/13/2006  . MENOPAUSAL SYNDROME 03/13/2006  . OSTEOARTHRITIS, MULTI SITES 03/13/2006   PCP:  Sandi Mariscal, MD Pharmacy:   [place], [place] [address] Phone: [phone] Fax: [phone]  [facility] [place], [place] - [place] [address] AT [place] [address] [place] [address] Phone: [phone] Fax: [phone]     Social Determinants of Health (SDOH) Interventions    Readmission Risk Interventions Readmission Risk Prevention Plan 03/28/2018  Transportation Screening Complete  Home Care Screening Complete  Medication Review (RN CM) Complete  Some recent data might be hidden

## 2020-05-26 LAB — BASIC METABOLIC PANEL
Anion gap: 4 — ABNORMAL LOW (ref 5–15)
BUN: 56 mg/dL — ABNORMAL HIGH (ref 8–23)
CO2: 26 mmol/L (ref 22–32)
Calcium: 8.6 mg/dL — ABNORMAL LOW (ref 8.9–10.3)
Chloride: 107 mmol/L (ref 98–111)
Creatinine, Ser: 2.05 mg/dL — ABNORMAL HIGH (ref 0.44–1.00)
GFR, Estimated: 25 mL/min — ABNORMAL LOW (ref >60)
Glucose, Bld: 85 mg/dL (ref 70–99)
Potassium: 3.5 mmol/L (ref 3.5–5.1)
Sodium: 137 mmol/L (ref 135–145)

## 2020-05-26 LAB — CBC
HCT: 27.2 % — ABNORMAL LOW (ref 36.0–46.0)
Hemoglobin: 9.3 g/dL — ABNORMAL LOW (ref 12.0–15.0)
MCH: 31.2 pg (ref 26.0–34.0)
MCHC: 34.2 g/dL (ref 30.0–36.0)
MCV: 91.3 fL (ref 80.0–100.0)
Platelets: 122 10*3/uL — ABNORMAL LOW (ref 150–400)
RBC: 2.98 MIL/uL — ABNORMAL LOW (ref 3.87–5.11)
RDW: 13.5 % (ref 11.5–15.5)
WBC: 2.1 10*3/uL — ABNORMAL LOW (ref 4.0–10.5)
nRBC: 0 % (ref 0.0–0.2)

## 2020-05-26 LAB — URINE CULTURE: Culture: >=100000 — AB

## 2020-05-26 MED ORDER — CEPHALEXIN 500 MG PO CAPS: 500.0000 mg | ORAL_CAPSULE | Freq: Two times a day (BID) | ORAL | 0 refills | Status: AC

## 2020-05-26 MED ORDER — IPRATROPIUM-ALBUTEROL 0.5-2.5 (3) MG/3ML IN SOLN: 3.0000 mL | RESPIRATORY_TRACT | Status: DC | PRN

## 2020-05-26 NOTE — TOC Transition Note (Signed)
Transition of Care [facility]) - CM/SW Discharge Note   Patient Details  Name: Allison Vaughan MRN: [id number] Date of Birth: [birth year]  Transition of Care [facility]) CM/SW Contact:  Lynnell Catalan, RN Phone Number: 05/26/2020, 1:13 PM   Clinical Narrative:     Pt to dc home with [facility] hospice today. DME to be delivered today and per daughter [name] she will come and pick pt up to take pt home via private vehicle. Yellow DNR to be signed and discharged with pt home.      Readmission Risk Interventions Readmission Risk Prevention Plan 03/28/2018  Transportation Screening Complete  Home Care Screening Complete  Medication Review (RN CM) Complete  Some recent data might be hidden

## 2020-05-26 NOTE — Care Management Important Message (Signed)
Important Message  Patient Details IM Letter given to the Patient. Name: Allison Vaughan MRN: [id number] Date of Birth: [birth year]   Medicare Important Message Given:  Yes     Kerin Salen 05/26/2020, 12:00 PM

## 2020-05-26 NOTE — Progress Notes (Signed)
Manufacturing engineer [facility]) Hospital Liaison: RN note   Notified by Transition of Care Manger of patient/family request for [facility] services at home after discharge. Chart and patient information under review by [facility] physician. Hospice eligibility confirmed.    Per discussion, plan is for discharge to home.   Please send signed and completed DNR form home with patient/family. Patient will need prescriptions for discharge comfort medications.     DME needs have been discussed, patient currently has the following equipment in the home: 3N1.  Patient/family requests the following DME for delivery to the home: Hospital bed and oxygen. [place] equipment manager has been notified and will contact DME provider to arrange delivery to the home. Home address has been verified and is correct in the chart.  [name]   is the family member to contact to arrange time of delivery.    [facility] aware of the above. Please notify [facility] when patient is ready to leave the unit at discharge. (Call [phone] or [phone] after 5pm.) [facility] information and contact numbers given to [facility].      A Please do not hesitate to call with questions.    Thank you,   Farrel Gordon, RN, [place] Hospital Liaison   [phone]

## 2020-05-26 NOTE — Discharge Summary (Signed)
Physician Discharge Summary  Allison Vaughan [id number] DOB: 1945/05/31 DOA: 05/23/2020  PCP: Sandi Mariscal, MD  Admit date: 05/23/2020   Discharge date: 05/26/2020  Admitted From:  Home  Disposition: Home with Hospice.  Recommendations for Outpatient Follow-up:  1. Follow up with PCP in 1-2 weeks. 2. Please obtain BMP/CBC in one week. 3.   Patient has been discharged to home with home hospice. 4.   Patient is being discharged with Foley catheter for comfort. 5.   Patient is being discharged on Keflex 500 mg twice daily for 3 more days for UTI.  Home Health: Home with Hospice Equipment/Devices: None  Discharge Condition: Stable CODE STATUS:DNR Diet recommendation: Heart Healthy   Brief Summary / Hospital Course: This 75 years old female with PMH significant for non curable pancreatic adenocarcinoma, currently on palliative treatment, hypertension, A. fib, chronic diastolic CHF, COPD, history of prolonged QT and GERD. Patient was unable to give detailed history. Daughter reports  Patient was found to be hypoxic and confused on the day of hospitalization. Apparently she was evaluated as an outpatient for hospice services.  Patient was found to be confused in the setting of UTI. Patient was admitted for acute metabolic encephalopathy in the setting of UTI.   Patient was started on IV ceftriaxone, urine cultures grew Klebsiella and E. coli , antibiotics changed to Keflex.  Renal functions improving with IV hydration.  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 Palliative care was consulted for goals of care discussion.  Patient and family has agreed on home with home hospice.  Patient has Foley catheter for comfort.  Patient is feeling better and patient is being discharged to home with home hospice.  She was managed for below problems.  Discharge Diagnoses:  Active Problems:   AKI (acute kidney injury) ([place])   Acute metabolic encephalopathy   UTI (urinary tract infection)   Acute  respiratory failure with hypoxia ([facility])   Pancreatic adenocarcinoma ([facility])  Acute metabolic encephalopathy, in the setting of urinary tract infection (present on admission, no sepsis).  Patient is awake and alert, continue to be confused but not agitated, not clear about her baseline.  Continue supportive medical care with IV fluids and neuro checks. Continue ceftriaxone.  Urine culture grew Klebsiella and E. Coli,  antibiotics changed to Keflex. Out of bed as tolerated, will talk to her daughter about hospice.   Acute hypoxemic respiratory failure: > Resolved. likely due to atelectasis, clinically no signs of COPD exacerbation. Will hold on steroids, continue with as needed bronchodilators.  Supplemental 02 per [place] as needed.  Encourage incentive spirometer.   AKI on CKD stage 3a :> Improving  Pre-renal AKI, renal function today with serum cr at 2.05 with bun 56. K is 3.9 and serum Na is 132. Continue hydration with isotonic saline and follow up on renal function in am. Avoid hypotension and nephrotoxic medications.   Pancreatic adenocarcinoma.  Not candidate to palliative treatment, possible hospice. Palliative care team was consulted, and agreed for home with hospice. Continue foley for comfort.  Depression. Continue with fluoxetine.  Hyponatremia :  Improved Could be multifactorial, continue gentle iv hydration  Pancytopenia:  This could be due to pancreatic ca, moniter CBC.  Discharge Instructions  Discharge Instructions    Call MD for:  difficulty breathing, headache or visual disturbances   Complete by: As directed    Call MD for:  persistant dizziness or light-headedness   Complete by: As directed    Call MD for:  persistant nausea and vomiting  Complete by: As directed    Diet - low sodium heart healthy   Complete by: As directed    Diet Carb Modified   Complete by: As directed    Discharge instructions   Complete by: As directed    Patient has been  discharged home with home hospice. Patient is being discharged with Foley catheter for comfort Patient is being discharged on Keflex 500 mg twice daily for 3 more days for UTI.   Increase activity slowly   Complete by: As directed      Allergies as of 05/26/2020   No Known Allergies     Medication List    STOP taking these medications   atorvastatin 10 MG tablet Commonly known as: LIPITOR   BC HEADACHE PO   losartan 100 MG tablet Commonly known as: COZAAR   losartan 25 MG tablet Commonly known as: COZAAR   multivitamin with minerals Tabs tablet   omeprazole 40 MG capsule Commonly known as: PRILOSEC   potassium chloride 10 MEQ tablet Commonly known as: KLOR-CON     TAKE these medications   albuterol 108 (90 Base) MCG/ACT inhaler Commonly known as: VENTOLIN HFA Inhale 2 puffs into the lungs every 6 (six) hours as needed for wheezing.   albuterol (2.5 MG/3ML) 0.083% nebulizer solution Commonly known as: PROVENTIL Take 3 mLs (2.5 mg total) by nebulization every 2 (two) hours as needed for wheezing.   ALPRAZolam 1 MG tablet Commonly known as: XANAX Take 1 mg by mouth at bedtime.   cephALEXin 500 MG capsule Commonly known as: KEFLEX Take 1 capsule (500 mg total) by mouth 2 (two) times daily for 10 days.   FLUoxetine 40 MG capsule Commonly known as: PROZAC Take 40 mg by mouth every evening.   lactose free nutrition Liqd Take 237 mLs by mouth daily as needed (meal replacement). CHOCOLATE   metoprolol succinate 25 MG 24 hr tablet Commonly known as: TOPROL-XL TAKE 2 TABLETS EVERY DAY. TAKE WITH OR IMMEDIATELY FOLLOWING A MEAL. What changed: See the new instructions.   ondansetron 4 MG disintegrating tablet Commonly known as: Zofran ODT Take 1 tablet (4 mg total) by mouth every 12 (twelve) hours as needed for nausea or vomiting.   oxyCODONE 15 MG immediate release tablet Commonly known as: ROXICODONE Take 15 mg by mouth 4 (four) times daily as needed for  pain.   prochlorperazine 10 MG tablet Commonly known as: COMPAZINE TAKE 1 TABLET BY MOUTH EVERY 6 HOURS AS NEEDED (NAUSEA OR VOMITING). What changed: See the new instructions.       Follow-up Information    Sandi Mariscal, MD Follow up in 1 week(s).   Specialty: Internal Medicine Contact information: [place] [address] [phone]              No Known Allergies  Consultations:  Palliative care   Procedures/Studies: DG Chest Port 1 View  Result Date: 05/23/2020 CLINICAL DATA:  Shortness of breath and cough. EXAM: PORTABLE CHEST 1 VIEW COMPARISON:  08/26/2019 FINDINGS: 1654 hours. The lungs are clear without focal pneumonia, edema, pneumothorax or pleural effusion. Cardiopericardial silhouette is at upper limits of normal for size. Bones crash that Bones are diffusely demineralized. Telemetry leads overlie the chest. Right-sided Port-A-Cath again noted. IMPRESSION: No active disease. Electronically Signed   By: Misty Stanley M.D.   On: 05/23/2020 17:09      Subjective: Patient was seen and examined at bedside.  Overnight events noted.  Patient reports feeling improved.  Patient wants  to be discharged.  Patient still has burning while urination,  being discharged on antibiotics.  Discharge Exam: Vitals:   05/26/20 0608 05/26/20 0744  BP: (!) 171/82   Pulse: 64   Resp: 16   Temp: 98.1 F (36.7 C)   SpO2: 95% 96%   Vitals:   05/25/20 2004 05/25/20 2030 05/26/20 0608 05/26/20 0744  BP: (!) 174/88  (!) 171/82   Pulse: 69  64   Resp: 16  16   Temp: 98 F (36.7 C)  98.1 F (36.7 C)   TempSrc: Oral  Oral   SpO2: 97% 95% 95% 96%  Weight:      Height:        General: Pt is alert, awake, not in acute distress Cardiovascular: RRR, S1/S2 +, no rubs, no gallops Respiratory: CTA bilaterally, no wheezing, no rhonchi Abdominal: Soft, NT, ND, bowel sounds + Extremities: no edema, no cyanosis    The results of significant diagnostics from this  hospitalization (including imaging, microbiology, ancillary and laboratory) are listed below for reference.     Microbiology: Recent Results (from the past 240 hour(s))  Resp Panel by RT-PCR (Flu A&B, Covid) Nasopharyngeal Swab     Status: None   Collection Time: 05/23/20  5:14 PM   Specimen: Nasopharyngeal Swab; Nasopharyngeal(NP) swabs in vial transport medium  Result Value Ref Range Status   SARS Coronavirus 2 by RT PCR NEGATIVE NEGATIVE Final    Comment: (NOTE) SARS-CoV-2 target nucleic acids are NOT DETECTED.  The SARS-CoV-2 RNA is generally detectable in upper respiratory specimens during the acute phase of infection. The lowest concentration of SARS-CoV-2 viral copies this assay can detect is 138 copies/mL. A negative result does not preclude SARS-Cov-2 infection and should not be used as the sole basis for treatment or other patient management decisions. A negative result may occur with  improper specimen collection/handling, submission of specimen other than nasopharyngeal swab, presence of viral mutation(s) within the areas targeted by this assay, and inadequate number of viral copies(<138 copies/mL). A negative result must be combined with clinical observations, patient history, and epidemiological information. The expected result is Negative.  Fact Sheet for Patients:  BloggerCourse.com  Fact Sheet for Healthcare Providers:  SeriousBroker.it  This test is no t yet approved or cleared by the [place] FDA and  has been authorized for detection and/or diagnosis of SARS-CoV-2 by FDA under an Emergency Use Authorization (EUA). This EUA will remain  in effect (meaning this test can be used) for the duration of the COVID-19 declaration under Section 564(b)(1) of the Act, 21 U.S.C.section 360bbb-3(b)(1), unless the authorization is terminated  or revoked sooner.       Influenza A by PCR NEGATIVE NEGATIVE Final    Influenza B by PCR NEGATIVE NEGATIVE Final    Comment: (NOTE) The Xpert Xpress SARS-CoV-2/FLU/RSV plus assay is intended as an aid in the diagnosis of influenza from Nasopharyngeal swab specimens and should not be used as a sole basis for treatment. Nasal washings and aspirates are unacceptable for Xpert Xpress SARS-CoV-2/FLU/RSV testing.  Fact Sheet for Patients: BloggerCourse.com  Fact Sheet for Healthcare Providers: SeriousBroker.it  This test is not yet approved or cleared by the [place] FDA and has been authorized for detection and/or diagnosis of SARS-CoV-2 by FDA under an Emergency Use Authorization (EUA). This EUA will remain in effect (meaning this test can be used) for the duration of the COVID-19 declaration under Section 564(b)(1) of the Act, 21 U.S.C. section 360bbb-3(b)(1), unless the authorization is  terminated or revoked.  Performed at [facility], [place] [address] [place], [place] [address]   Urine culture     Status: Abnormal   Collection Time: 05/23/20  8:02 PM   Specimen: Urine, Random  Result Value Ref Range Status   Specimen Description   Final    URINE, RANDOM Performed at [facility] [address] [place], [place] [address]    Special Requests   Final    NONE Performed at [facility], [place] [address], [place],  [address]    Culture (A)  Final    >=100,000 COLONIES/mL KLEBSIELLA PNEUMONIAE >=100,000 COLONIES/mL ESCHERICHIA COLI    Report Status 05/26/2020 FINAL  Final   Organism ID, Bacteria KLEBSIELLA PNEUMONIAE (A)  Final   Organism ID, Bacteria ESCHERICHIA COLI (A)  Final      Susceptibility   Escherichia coli - MIC*    AMPICILLIN <=2 SENSITIVE Sensitive     CEFAZOLIN <=4 SENSITIVE Sensitive     CEFEPIME <=0.12 SENSITIVE Sensitive     CEFTRIAXONE <=0.25 SENSITIVE Sensitive     CIPROFLOXACIN <=0.25 SENSITIVE Sensitive      GENTAMICIN <=1 SENSITIVE Sensitive     IMIPENEM <=0.25 SENSITIVE Sensitive     NITROFURANTOIN <=16 SENSITIVE Sensitive     TRIMETH/SULFA <=20 SENSITIVE Sensitive     AMPICILLIN/SULBACTAM <=2 SENSITIVE Sensitive     PIP/TAZO <=4 SENSITIVE Sensitive     * >=100,000 COLONIES/mL ESCHERICHIA COLI   Klebsiella pneumoniae - MIC*    AMPICILLIN RESISTANT Resistant     CEFAZOLIN <=4 SENSITIVE Sensitive     CEFEPIME <=0.12 SENSITIVE Sensitive     CEFTRIAXONE <=0.25 SENSITIVE Sensitive     CIPROFLOXACIN <=0.25 SENSITIVE Sensitive     GENTAMICIN <=1 SENSITIVE Sensitive     IMIPENEM <=0.25 SENSITIVE Sensitive     NITROFURANTOIN <=16 SENSITIVE Sensitive     TRIMETH/SULFA <=20 SENSITIVE Sensitive     AMPICILLIN/SULBACTAM <=2 SENSITIVE Sensitive     PIP/TAZO <=4 SENSITIVE Sensitive     * >=100,000 COLONIES/mL KLEBSIELLA PNEUMONIAE     Labs: BNP (last 3 results) Recent Labs    08/13/19 0834 08/24/19 2003  BNP 1,114.0* 235.5*   Basic Metabolic Panel: Recent Labs  Lab 05/23/20 1714 05/24/20 0520 05/25/20 0518 05/26/20 0536  NA 130* 132* 132* 137  K 4.5 3.9 4.0 3.5  CL 95* 100 102 107  CO2 22 25 21* 26  GLUCOSE 111* 90 121* 85  BUN 70* 66* 64* 56*  CREATININE 4.20* 3.43* 2.63* 2.05*  CALCIUM 8.5* 8.1* 8.5* 8.6*   Liver Function Tests: Recent Labs  Lab 05/23/20 1714  AST 14*  ALT 9  ALKPHOS 61  BILITOT 0.3  PROT 5.7*  ALBUMIN 3.3*   No results for input(s): LIPASE, AMYLASE in the last 168 hours. No results for input(s): AMMONIA in the last 168 hours. CBC: Recent Labs  Lab 05/23/20 1714 05/24/20 0520 05/25/20 0518 05/26/20 0536  WBC 5.3 2.8* 2.3* 2.1*  NEUTROABS 4.0  --  2.1  --   HGB 10.5* 9.5* 9.4* 9.3*  HCT 30.7* 27.7* 27.1* 27.2*  MCV 89.5 90.2 88.6 91.3  PLT 160 114* 123* 122*   Cardiac Enzymes: No results for input(s): CKTOTAL, CKMB, CKMBINDEX, TROPONINI in the last 168 hours. BNP: Invalid input(s): POCBNP CBG: No results for input(s): GLUCAP in the last  168 hours. D-Dimer No results for input(s): DDIMER in the last 72 hours. Hgb A1c No results for input(s): HGBA1C in the last 72  hours. Lipid Profile No results for input(s): CHOL, HDL, LDLCALC, TRIG, CHOLHDL, LDLDIRECT in the last 72 hours. Thyroid function studies No results for input(s): TSH, T4TOTAL, T3FREE, THYROIDAB in the last 72 hours.  Invalid input(s): FREET3 Anemia work up No results for input(s): VITAMINB12, FOLATE, FERRITIN, TIBC, IRON, RETICCTPCT in the last 72 hours. Urinalysis    Component Value Date/Time   COLORURINE YELLOW 05/23/2020 2002   APPEARANCEUR HAZY (A) 05/23/2020 2002   LABSPEC 1.012 05/23/2020 2002   PHURINE 5.0 05/23/2020 2002   GLUCOSEU NEGATIVE 05/23/2020 2002   HGBUR NEGATIVE 05/23/2020 2002   [place] 05/23/2020 2002   [place] 05/23/2020 2002   PROTEINUR NEGATIVE 05/23/2020 2002   UROBILINOGEN 0.2 04/30/2014 2050   NITRITE NEGATIVE 05/23/2020 2002   LEUKOCYTESUR LARGE (A) 05/23/2020 2002   Sepsis Labs Invalid input(s): PROCALCITONIN,  WBC,  LACTICIDVEN Microbiology Recent Results (from the past 240 hour(s))  Resp Panel by RT-PCR (Flu A&B, Covid) Nasopharyngeal Swab     Status: None   Collection Time: 05/23/20  5:14 PM   Specimen: Nasopharyngeal Swab; Nasopharyngeal(NP) swabs in vial transport medium  Result Value Ref Range Status   SARS Coronavirus 2 by RT PCR NEGATIVE NEGATIVE Final    Comment: (NOTE) SARS-CoV-2 target nucleic acids are NOT DETECTED.  The SARS-CoV-2 RNA is generally detectable in upper respiratory specimens during the acute phase of infection. The lowest concentration of SARS-CoV-2 viral copies this assay can detect is 138 copies/mL. A negative result does not preclude SARS-Cov-2 infection and should not be used as the sole basis for treatment or other patient management decisions. A negative result may occur with  improper specimen collection/handling, submission of specimen other than  nasopharyngeal swab, presence of viral mutation(s) within the areas targeted by this assay, and inadequate number of viral copies(<138 copies/mL). A negative result must be combined with clinical observations, patient history, and epidemiological information. The expected result is Negative.  Fact Sheet for Patients:  EntrepreneurPulse.com.au  Fact Sheet for Healthcare Providers:  IncredibleEmployment.be  This test is no t yet approved or cleared by the [place] FDA and  has been authorized for detection and/or diagnosis of SARS-CoV-2 by FDA under an Emergency Use Authorization (EUA). This EUA will remain  in effect (meaning this test can be used) for the duration of the COVID-19 declaration under Section 564(b)(1) of the Act, 21 U.S.C.section 360bbb-3(b)(1), unless the authorization is terminated  or revoked sooner.       Influenza A by PCR NEGATIVE NEGATIVE Final   Influenza B by PCR NEGATIVE NEGATIVE Final    Comment: (NOTE) The Xpert Xpress SARS-CoV-2/FLU/RSV plus assay is intended as an aid in the diagnosis of influenza from Nasopharyngeal swab specimens and should not be used as a sole basis for treatment. Nasal washings and aspirates are unacceptable for Xpert Xpress SARS-CoV-2/FLU/RSV testing.  Fact Sheet for Patients: EntrepreneurPulse.com.au  Fact Sheet for Healthcare Providers: IncredibleEmployment.be  This test is not yet approved or cleared by the [place] FDA and has been authorized for detection and/or diagnosis of SARS-CoV-2 by FDA under an Emergency Use Authorization (EUA). This EUA will remain in effect (meaning this test can be used) for the duration of the COVID-19 declaration under Section 564(b)(1) of the Act, 21 U.S.C. section 360bbb-3(b)(1), unless the authorization is terminated or revoked.  Performed at [facility], [place] [address] [place], [place] [address]   Urine culture     Status: Abnormal   Collection Time: 05/23/20  8:02 PM  Specimen: Urine, Random  Result Value Ref Range Status   Specimen Description   Final    URINE, RANDOM Performed at [facility] [address] [place], [place] [address]    Special Requests   Final    NONE Performed at [facility], [place] [address] [place], [place] [address]    Culture (A)  Final    >=100,000 COLONIES/mL KLEBSIELLA PNEUMONIAE >=100,000 COLONIES/mL ESCHERICHIA COLI    Report Status 05/26/2020 FINAL  Final   Organism ID, Bacteria KLEBSIELLA PNEUMONIAE (A)  Final   Organism ID, Bacteria ESCHERICHIA COLI (A)  Final      Susceptibility   Escherichia coli - MIC*    AMPICILLIN <=2 SENSITIVE Sensitive     CEFAZOLIN <=4 SENSITIVE Sensitive     CEFEPIME <=0.12 SENSITIVE Sensitive     CEFTRIAXONE <=0.25 SENSITIVE Sensitive     CIPROFLOXACIN <=0.25 SENSITIVE Sensitive     GENTAMICIN <=1 SENSITIVE Sensitive     IMIPENEM <=0.25 SENSITIVE Sensitive     NITROFURANTOIN <=16 SENSITIVE Sensitive     TRIMETH/SULFA <=20 SENSITIVE Sensitive     AMPICILLIN/SULBACTAM <=2 SENSITIVE Sensitive     PIP/TAZO <=4 SENSITIVE Sensitive     * >=100,000 COLONIES/mL ESCHERICHIA COLI   Klebsiella pneumoniae - MIC*    AMPICILLIN RESISTANT Resistant     CEFAZOLIN <=4 SENSITIVE Sensitive     CEFEPIME <=0.12 SENSITIVE Sensitive     CEFTRIAXONE <=0.25 SENSITIVE Sensitive     CIPROFLOXACIN <=0.25 SENSITIVE Sensitive     GENTAMICIN <=1 SENSITIVE Sensitive     IMIPENEM <=0.25 SENSITIVE Sensitive     NITROFURANTOIN <=16 SENSITIVE Sensitive     TRIMETH/SULFA <=20 SENSITIVE Sensitive     AMPICILLIN/SULBACTAM <=2 SENSITIVE Sensitive     PIP/TAZO <=4 SENSITIVE Sensitive     * >=100,000 COLONIES/mL KLEBSIELLA PNEUMONIAE     Time coordinating discharge: Over 30 minutes  SIGNED:   Shawna Clamp, MD  [facility] 05/26/2020, 1:14 PM Pager   If  7PM-7AM, please contact night-coverage [email or web address]

## 2020-05-26 NOTE — Discharge Instructions (Signed)
Patient has been discharged home with home hospice. Patient is being discharged with Foley catheter for comfort Patient is being discharged on Keflex 500 mg twice daily for 3 more days for UTI.

## 2020-05-26 NOTE — Progress Notes (Signed)
Daily Progress Note   Patient Name: Allison Vaughan       Date: 05/26/2020 DOB: 1945/11/27  Age: 75 y.o. MRN#: [id number] Attending Physician: Shawna Clamp, MD Primary Care Physician: Sandi Mariscal, MD Admit Date: 05/23/2020  Reason for Consultation/Follow-up: Establishing goals of care  Subjective:  patient on the phone with her daughter [name], she is awake alert today, pleasant and comfortable, denies pain.  She hopes to be home.  Discussed with daughter [name] on the phone, she is awaiting equipment delivery from hospice today.  Medication history noted.    Length of Stay: 3  Current Medications: Scheduled Meds:  . chlorhexidine  15 mL Mouth Rinse BID  . Chlorhexidine Gluconate Cloth  6 each Topical Daily  . fentaNYL  1 patch Transdermal Q72H  . FLUoxetine  40 mg Oral QPM  . heparin  5,000 Units Subcutaneous Q8H  . ipratropium-albuterol  3 mL Nebulization TID  . mouth rinse  15 mL Mouth Rinse q12n4p  . metoprolol succinate  50 mg Oral Q breakfast  . pantoprazole  40 mg Oral Daily  . sodium chloride flush  10-40 mL Intracatheter Q12H    Continuous Infusions: . sodium chloride 75 mL/hr at 05/26/20 0315  . cefTRIAXone (ROCEPHIN)  IV Stopped (05/25/20 2147)    PRN Meds: acetaminophen, albuterol, ALPRAZolam, lip balm, oxyCODONE, sodium chloride, sodium chloride flush  Physical Exam         Appears frail and with chronic weakness Regular work of breathing No edema Mild distress Abdomen not distended No edema  Vital Signs: BP (!) 171/82 (BP Location: Left Arm)   Pulse 64   Temp 98.1 F (36.7 C) (Oral)   Resp 16   Ht 5\' 2"  (1.575 m)   Wt 46.7 kg   SpO2 96%   BMI 18.83 kg/m  SpO2: SpO2: 96 % O2 Device: O2 Device: Room Air O2 Flow Rate: O2 Flow Rate (L/min): 2  L/min  Intake/output summary:   Intake/Output Summary (Last 24 hours) at 05/26/2020 1051 Last data filed at 05/26/2020 0612 Gross per 24 hour  Intake 2937.99 ml  Output 1000 ml  Net 1937.99 ml   LBM: Last BM Date:  (unknown; prior to admission) Baseline Weight: Weight: 49.9 kg Most recent weight: Weight: 46.7 kg       Palliative Assessment/Data:  Patient Active Problem List   Diagnosis Date Noted  . Acute respiratory failure with hypoxia ([place]) 05/24/2020  . Pancreatic adenocarcinoma ([place]) 05/24/2020  . UTI (urinary tract infection) 05/23/2020  . Genetic testing 09/21/2019  . DNR (do not resuscitate)   . Advanced care planning/counseling discussion   . Palliative care by specialist   . Acute on chronic diastolic CHF (congestive heart failure) ([place]) 08/24/2019  . Acute on chronic diastolic (congestive) heart failure ([place]) 08/24/2019  . COPD exacerbation ([place]) 08/13/2019  . Acute CHF (congestive heart failure) ([place]) 08/13/2019  . Hypertensive urgency 08/13/2019  . Allergy reaction, arthritis 08/09/2019  . Port-A-Cath in place 07/30/2019  . Goals of care, counseling/discussion 07/13/2019  . 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[place]) 07/12/2019  . Pancreatic neoplasm 06/24/2019  . Prolonged QT interval 12/22/2018  . Hypomagnesemia 12/22/2018  . Constipation 12/22/2018  . Dilation of pancreatic duct 12/22/2018  . Altered mental status 07/09/2018  . Acute lower UTI 07/09/2018  . Anemia 07/09/2018  . Bronchiolitis 07/09/2018  . Bronchitis 07/09/2018  . Acute metabolic encephalopathy 18/84/1660  . Contusion of right wrist   . Fall   . Bacteria in urine   . Mild renal insufficiency 03/26/2018  . 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 Atrial fibrillation with RVR ([place]) 03/01/2017  . Acute on chronic respiratory failure with hypoxia ([place]) 03/01/2017  . Fall at home, initial encounter 03/01/2017  . Accidental drug overdose   . Acute respiratory failure with  hypercapnia ([place]) 01/22/2017  . Acute encephalopathy   . AKI (acute kidney injury) ([place]) 04/30/2014  . Pulmonary nodule 04/30/2014  . Essential hypertension 04/30/2014  . Hypokalemia 04/30/2014  . Hyponatremia 04/30/2014  . Hypotension 04/30/2014  . Nausea & vomiting 04/30/2014  . Lung nodule 04/16/2014  . Chronic hypoxemic respiratory failure ([place]) 06/26/2012  . COPD (chronic obstructive pulmonary disease) ([place]) 06/26/2012  . HYPERCHOLESTEROLEMIA 03/13/2006  . TOBACCO DEPENDENCE 03/13/2006  . Mood disorder ([place]) 03/13/2006  . HYPERTENSION, BENIGN SYSTEMIC 03/13/2006  . GASTROESOPHAGEAL REFLUX, NO ESOPHAGITIS 03/13/2006  . IRRITABLE BOWEL SYNDROME 03/13/2006  . MENOPAUSAL SYNDROME 03/13/2006  . OSTEOARTHRITIS, MULTI SITES 03/13/2006    Palliative Care Assessment & Plan   Patient Profile:    Assessment:  Ms. Barbara is 75 years old.  She lives at home with her daughter [name] here in [place], [place].  She is under the care of Dr. Burr Medico for pancreatic adenocarcinoma for which she was recently to be referred to hospice services.  Also has underlying hypertension atrial fibrillation chronic diastolic heart failure COPD gastroesophageal reflux disease.  Patient admitted to hospital medicine service with acute metabolic encephalopathy in the setting of urinary tract infection, acute hypoxic respiratory failure, acute kidney injury in the setting of stage III chronic kidney disease with underlying life limiting illness of pancreatic adenocarcinoma.  Patient started on IV fluids and antibiotics.  Hospice consultation has been completed here in the hospital.  Palliative consultation for additional goals of care discussions has been requested as well as for adequate pain and on pain symptom management options.  Recommendations/Plan: Continue foley for comfort Home with  Hospice once arrangements complete On PO PRN opioids, also on low dose trans dermal fentanyl  On Xanax for  anxiety management    Code Status:    Code Status Orders  (From admission, onward)         Start     Ordered   05/24/20 1633  Do not attempt resuscitation (DNR)  Continuous  Question Answer Comment  In the event of cardiac or respiratory ARREST Do not call a "code blue"   In the event of cardiac or respiratory ARREST Do not perform Intubation, CPR, defibrillation or ACLS   In the event of cardiac or respiratory ARREST Use medication by any route, position, wound care, and other measures to relive pain and suffering. May use oxygen, suction and manual treatment of airway obstruction as needed for comfort.      05/24/20 1633        Code Status History    Date Active Date Inactive Code Status Order ID Comments User Context   05/23/2020 2220 05/24/2020 1633 Full Code [id number]  Orene Desanctis, DO ED   08/27/2019 1153 08/29/2019 1714 DNR [id number]  Mitzi Hansen, MD Inpatient   08/24/2019 2319 08/27/2019 1153 Full Code [id number]  Neena Rhymes, MD ED   08/13/2019 1847 08/15/2019 1814 Full Code [id number]  Harold Hedge, MD Inpatient   12/22/2018 0143 12/26/2018 1559 Full Code [id number]  [name], MD ED   07/09/2018 0444 07/09/2018 2001 Full Code [id number]  [name], MD Inpatient   03/26/2018 0124 03/28/2018 1753 Full Code [id number]  Vianne Bulls, MD ED   03/02/2017 0147 03/03/2017 1934 Full Code [id number]  Toy Baker, MD ED   01/22/2017 1147 01/24/2017 1446 Full Code [id number]  Minor, Grace Bushy, NP ED   04/30/2014 2152 05/02/2014 1511 Full Code [id number]  Lavina Hamman, MD ED   04/16/2014 0255 04/20/2014 2004 Full Code [id number]  Rise Patience, MD Inpatient   06/26/2012 0104 06/27/2012 1538 Full Code [id number]  Etta Quill., DO ED   06/06/2012 2238 06/09/2012 1521 Full Code [id number]  Theodis Blaze, MD ED   Advance Care Planning Activity      Prognosis:  < 6 months  Discharge Planning: Home with Hospice  Care plan was discussed with  Patient's daughter [name] on the  phone as well as with the patient.   Thank you for allowing the Palliative Medicine Team to assist in the care of this patient.   Time In: 9 Time Out: 9.25  Total Time 25 Prolonged Time Billed No       Greater than 50%  of this time was spent counseling and coordinating care related to the above assessment and plan.  Loistine Chance, MD  Please contact Palliative Medicine Team phone at [phone] for questions and concerns.

## 2020-05-26 NOTE — Progress Notes (Signed)
PT Cancellation Note  Patient Details Name: Allison Vaughan MRN: [id number] DOB: Nov 18, 1945   Cancelled Treatment:    Reason Eval/Treat Not Completed: Other (comment). Pt going home with hospice. PT signing off    [facility] 05/26/2020, 11:14 AM

## 2020-07-01 ENCOUNTER — Other Ambulatory Visit: Payer: Self-pay

## 2020-07-01 ENCOUNTER — Emergency Department (HOSPITAL_COMMUNITY)

## 2020-07-01 ENCOUNTER — Emergency Department (HOSPITAL_COMMUNITY)
Admission: EM | Admit: 2020-07-01 | Discharge: 2020-07-02 | Disposition: A | Discharge status: Home / Self Care | Attending: Emergency Medicine | Admitting: Emergency Medicine

## 2020-07-01 ENCOUNTER — Encounter (HOSPITAL_COMMUNITY): Payer: Self-pay

## 2020-07-01 DIAGNOSIS — Z8507 Personal history of malignant neoplasm of pancreas: Secondary | ICD-10-CM | POA: Insufficient documentation

## 2020-07-01 DIAGNOSIS — Y92009 Unspecified place in unspecified non-institutional (private) residence as the place of occurrence of the external cause: Secondary | ICD-10-CM | POA: Diagnosis not present

## 2020-07-01 DIAGNOSIS — M25551 Pain in right hip: Secondary | ICD-10-CM | POA: Insufficient documentation

## 2020-07-01 DIAGNOSIS — F17210 Nicotine dependence, cigarettes, uncomplicated: Secondary | ICD-10-CM | POA: Insufficient documentation

## 2020-07-01 DIAGNOSIS — I2510 Atherosclerotic heart disease of native coronary artery without angina pectoris: Secondary | ICD-10-CM | POA: Diagnosis not present

## 2020-07-01 DIAGNOSIS — I5033 Acute on chronic diastolic (congestive) heart failure: Secondary | ICD-10-CM | POA: Diagnosis not present

## 2020-07-01 DIAGNOSIS — W06XXXA Fall from bed, initial encounter: Secondary | ICD-10-CM | POA: Insufficient documentation

## 2020-07-01 DIAGNOSIS — Z79899 Other long term (current) drug therapy: Secondary | ICD-10-CM | POA: Insufficient documentation

## 2020-07-01 DIAGNOSIS — I110 Hypertensive heart disease with heart failure: Secondary | ICD-10-CM | POA: Diagnosis not present

## 2020-07-01 DIAGNOSIS — J441 Chronic obstructive pulmonary disease with (acute) exacerbation: Secondary | ICD-10-CM | POA: Insufficient documentation

## 2020-07-01 DIAGNOSIS — M79651 Pain in right thigh: Secondary | ICD-10-CM | POA: Insufficient documentation

## 2020-07-01 DIAGNOSIS — M25559 Pain in unspecified hip: Secondary | ICD-10-CM

## 2020-07-01 LAB — URINALYSIS, ROUTINE W REFLEX MICROSCOPIC
Bilirubin Urine: NEGATIVE
Glucose, UA: NEGATIVE mg/dL
Hgb urine dipstick: NEGATIVE
Ketones, ur: NEGATIVE mg/dL
Leukocytes,Ua: NEGATIVE
Nitrite: NEGATIVE
Protein, ur: NEGATIVE mg/dL
Specific Gravity, Urine: 1.006 (ref 1.005–1.030)
pH: 6 (ref 5.0–8.0)

## 2020-07-01 MED ORDER — OXYCODONE-ACETAMINOPHEN 5-325 MG PO TABS
1.0000 | ORAL_TABLET | Freq: Once | ORAL | Status: AC
  Administered 2020-07-01: 1 via ORAL
  Filled 2020-07-01: qty 1

## 2020-07-01 NOTE — ED Triage Notes (Signed)
Pt coming from home with fall from her hospital bed at home on 06/29/2020 that was not evaluated by hospital. Pt also complaining right arm pain with skin tear.  Pt on hospice for pancreatic cancer.  Today patient complaining of pain to right leg with deformity per EMS and discoloration.   Pt has had a recent UTI. She is on day 3 of ABX. Pt has been altered due to UTI.   18LAC, 4mcg of fentanyl, she also has a fentanyl patch that she causes every 3 days and today is day 2.  Hx of COPD. Pt has expiratory wheezing per EMS but it is normal per family.   Normal O2 92% on 4L. Baseline Aox4 but not since UTI.

## 2020-07-01 NOTE — ED Provider Notes (Signed)
[facility] DEPT Provider Note   CSN: [id number] Arrival date & time: 07/01/20  1630     History Chief Complaint  Patient presents with   Leg Injury    Allison Vaughan is a 75 y.o. female.  Patient with history of cancer, on hospice, presents with complaint of right thigh/hip pain.  Reportedly fell 2 days ago has persistent pain in the ER.  Family is concerned that she may have a fracture.  Family is also concerned that she may have a urinary tract infection.  Patient was treated started empirically on antibiotics but they are concerned that she may still have a UTI.  No reports of fevers or cough or vomiting or diarrhea.  Patient is on fentanyl patches for her cancer pain.      Past Medical History:  Diagnosis Date   Accidental drug overdose    Acute encephalopathy    Acute on chronic respiratory failure with hypoxia ([place]) 03/01/2017   Acute renal failure ([place]) 04/16/2014   AKI (acute kidney injury) ([place]) 04/30/2014   Altered mental status 06/27/2012   Anxiety    Atrial fibrillation ([place])    CAD (coronary artery disease)    CAP (community acquired pneumonia) 04/18/2014   Cardiomyopathy ([place])    Chronic hypoxemic respiratory failure ([place]) 06/26/2012   Chronic pain    COPD exacerbation ([place]) 06/26/2012   COPD with acute exacerbation ([place]) 03/01/2017   DEPRESSIVE DISORDER, NOS 03/13/2006   Qualifier: Diagnosis of  By: Damita Dunnings MD, Phillip Heal     Edema, peripheral    Essential hypertension 04/30/2014   Fall at home, initial encounter 03/01/2017   GASTROESOPHAGEAL REFLUX, NO ESOPHAGITIS 03/13/2006   Qualifier: Diagnosis of  By: Damita Dunnings MD, Burt Ek 03/13/2006   Qualifier: Diagnosis of  By: Damita Dunnings MD, Phillip Heal     Hyperlipidemia    HYPERTENSION, BENIGN SYSTEMIC 03/13/2006   Qualifier: Diagnosis of  By: Damita Dunnings MD, [place]     Hypokalemia 04/30/2014   Hyponatremia 04/30/2014   Hypotension 04/30/2014   Hypoxemia 06/26/2012   Hypoxia 03/26/2018    Irritable bowel syndrome 03/13/2006   Qualifier: Diagnosis of  By: Damita Dunnings MD, [place]     Lung nodule 04/16/2014   MENOPAUSAL SYNDROME 03/13/2006   Qualifier: Diagnosis of  By: Damita Dunnings MD, Phillip Heal     Mood disorder ([place]) 03/13/2006   Qualifier: Diagnosis of  By: Damita Dunnings MD, Phillip Heal     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[place])    Nausea 04/30/2014   Nausea vomiting and diarrhea 04/16/2014   OSTEOARTHRITIS, MULTI SITES 03/13/2006   Qualifier: Diagnosis of  By: Damita Dunnings MD, [place]     Pulmonary nodule 04/30/2014   Respiratory failure ([place]) 01/22/2017   TOBACCO DEPENDENCE 03/13/2006   Qualifier: Diagnosis of  By: Damita Dunnings MD, Phillip Heal     Weight loss     Patient Active Problem List   Diagnosis Date Noted   Acute respiratory failure with hypoxia ([place]) 05/24/2020   Pancreatic adenocarcinoma ([place]) 05/24/2020   UTI (urinary tract infection) 05/23/2020   Genetic testing 09/21/2019   DNR (do not resuscitate)    Advanced care planning/counseling discussion    Palliative care by specialist    Acute on chronic diastolic CHF (congestive heart failure) ([place]) 08/24/2019   Acute on chronic diastolic (congestive) heart failure ([place]) 08/24/2019   COPD exacerbation ([place]) 08/13/2019   Acute CHF (congestive heart failure) ([place]) 08/13/2019   Hypertensive urgency 08/13/2019   Allergy reaction, arthritis 08/09/2019   Port-A-Cath in place 07/30/2019  Goals of care, counseling/discussion 07/13/2019   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[place]) 07/12/2019   Pancreatic neoplasm 06/24/2019   Prolonged QT interval 12/22/2018   Hypomagnesemia 12/22/2018   Constipation 12/22/2018   Dilation of pancreatic duct 12/22/2018   Altered mental status 07/09/2018   Acute lower UTI 07/09/2018   Anemia 07/09/2018   Bronchiolitis 07/09/2018   Bronchitis 03/28/9456   Acute metabolic encephalopathy 59/29/2446   Contusion of right wrist    Fall    Bacteria in urine    Mild renal insufficiency 03/26/2018   Multifocal atrial  tachycardia (HCC)    Atrial fibrillation with RVR ([place]) 03/01/2017   Acute on chronic respiratory failure with hypoxia ([place]) 03/01/2017   Fall at home, initial encounter 03/01/2017   Accidental drug overdose    Acute respiratory failure with hypercapnia ([place]) 01/22/2017   Acute encephalopathy    AKI (acute kidney injury) ([place]) 04/30/2014   Pulmonary nodule 04/30/2014   Essential hypertension 04/30/2014   Hypokalemia 04/30/2014   Hyponatremia 04/30/2014   Hypotension 04/30/2014   Nausea & vomiting 04/30/2014   Lung nodule 04/16/2014   Chronic hypoxemic respiratory failure ([place]) 06/26/2012   COPD (chronic obstructive pulmonary disease) ([place]) 06/26/2012   HYPERCHOLESTEROLEMIA 03/13/2006   TOBACCO DEPENDENCE 03/13/2006   Mood disorder ([place]) 03/13/2006   HYPERTENSION, BENIGN SYSTEMIC 03/13/2006   GASTROESOPHAGEAL REFLUX, NO ESOPHAGITIS 03/13/2006   IRRITABLE BOWEL SYNDROME 03/13/2006   MENOPAUSAL SYNDROME 03/13/2006   OSTEOARTHRITIS, MULTI SITES 03/13/2006    Past Surgical History:  Procedure Laterality Date   ABDOMINAL HYSTERECTOMY     ESOPHAGOGASTRODUODENOSCOPY N/A 07/08/2019   Procedure: ESOPHAGOGASTRODUODENOSCOPY (EGD);  Surgeon: Milus Banister, MD;  Location: [facility];  Service: Endoscopy;  Laterality: N/A;   ESOPHAGOGASTRODUODENOSCOPY (EGD) WITH PROPOFOL N/A 05/05/2019   Procedure: ESOPHAGOGASTRODUODENOSCOPY (EGD) WITH PROPOFOL;  Surgeon: Arta Silence, MD;  Location: [facility];  Service: Endoscopy;  Laterality: N/A;   EUS N/A 07/08/2019   Procedure: UPPER ENDOSCOPIC ULTRASOUND (EUS) RADIAL;  Surgeon: Milus Banister, MD;  Location: [facility];  Service: Endoscopy;  Laterality: N/A;   EYE SURGERY     FIDUCIAL MARKER PLACEMENT N/A 07/08/2019   Procedure: FIDUCIAL MARKER PLACEMENT;  Surgeon: Milus Banister, MD;  Location: [facility];  Service: Endoscopy;  Laterality: N/A;   FINE NEEDLE ASPIRATION N/A 05/05/2019   Procedure: FINE NEEDLE ASPIRATION (FNA) LINEAR;   Surgeon: Arta Silence, MD;  Location: [facility];  Service: Endoscopy;  Laterality: N/A;   FINE NEEDLE ASPIRATION N/A 07/08/2019   Procedure: FINE NEEDLE ASPIRATION (FNA) LINEAR;  Surgeon: Milus Banister, MD;  Location: [facility];  Service: Endoscopy;  Laterality: N/A;   IR IMAGING GUIDED PORT INSERTION  07/27/2019   ROTATOR CUFF REPAIR Right 2012   UPPER ESOPHAGEAL ENDOSCOPIC ULTRASOUND (EUS) N/A 05/05/2019   Procedure: UPPER ESOPHAGEAL ENDOSCOPIC ULTRASOUND (EUS) with FNA;  Surgeon: Arta Silence, MD;  Location: [facility];  Service: Endoscopy;  Laterality: N/A;     OB History   No obstetric history on file.     Family History  Problem Relation Age of Onset   CAD Father    Lung cancer Brother    Cancer Brother        lung ca   Cancer Brother        possible pancreas cancer    Healthy Daughter    Healthy Daughter    Cancer Maternal Grandmother        breast   Cancer Paternal Grandmother        breast  Social History   Tobacco Use   Smoking status: Every Day    Packs/day: 1.50    Years: 50.00    Pack years: 75.00    Types: Cigarettes   Smokeless tobacco: Never   Tobacco comments:    states she is trying to quit  Vaping Use   Vaping Use: Never used  Substance Use Topics   Alcohol use: Not Currently    Comment: drank alcohol daily x20 years, quit 10 years ago   Drug use: No    Home Medications Prior to Admission medications   Medication Sig Start Date End Date Taking? Authorizing Provider  albuterol (PROVENTIL HFA;VENTOLIN HFA) 108 (90 BASE) MCG/ACT inhaler Inhale 2 puffs into the lungs every 6 (six) hours as needed for wheezing. 06/09/12  Yes Oti, Brantley Stage, MD  albuterol (PROVENTIL) (2.5 MG/3ML) 0.083% nebulizer solution Take 3 mLs (2.5 mg total) by nebulization every 2 (two) hours as needed for wheezing. Patient taking differently: Take 2.5 mg by nebulization 2 (two) times daily. 04/20/14  Yes Rama, Venetia Maxon, MD  ALPRAZolam Duanne Moron) 1 MG tablet Take 1  mg by mouth 2 (two) times daily as needed for anxiety. 05/19/20  Yes [provider]  Aspirin-Salicylamide-Caffeine (BC HEADACHE POWDER PO) Take 1 packet by mouth 3 (three) times daily as needed (pain/headache).   Yes [provider]  fentaNYL (DURAGESIC) 25 MCG/HR Place 1 patch onto the skin every 3 (three) days. 06/20/20  Yes [provider]  FLUoxetine (PROZAC) 40 MG capsule Take 40 mg by mouth every morning.   Yes [provider]  lactose free nutrition (BOOST) LIQD Take 237 mLs by mouth daily as needed (meal replacement). CHOCOLATE   Yes [provider]  levofloxacin (LEVAQUIN) 500 MG tablet Take 500 mg by mouth daily. 06/28/20  Yes [provider]  losartan-hydrochlorothiazide (HYZAAR) 100-25 MG tablet Take 1 tablet by mouth daily as needed (BP >200/100). 06/09/20  Yes [provider]  metoprolol succinate (TOPROL-XL) 25 MG 24 hr tablet TAKE 2 TABLETS EVERY DAY. TAKE WITH OR IMMEDIATELY FOLLOWING A MEAL. Patient taking differently: Take 50 mg by mouth daily. 01/27/20  Yes Hilty, Nadean Corwin, MD  Multiple Vitamin (MULTIVITAMIN WITH MINERALS) TABS tablet Take 1 tablet by mouth daily.   Yes [provider]  omeprazole (PRILOSEC) 40 MG capsule Take 40 mg by mouth every morning.   Yes [provider]  ondansetron (ZOFRAN ODT) 4 MG disintegrating tablet Take 1 tablet (4 mg total) by mouth every 12 (twelve) hours as needed for nausea or vomiting. Patient taking differently: Take 4 mg by mouth 2 (two) times daily as needed for nausea or vomiting. 05/12/20  Yes Truitt Merle, MD  oxyCODONE (ROXICODONE) 15 MG immediate release tablet Take 15 mg by mouth 4 (four) times daily as needed for pain. 05/19/20  Yes [provider]  OXYGEN Inhale 2-4 L into the lungs continuous.   Yes [provider]  potassium chloride (KLOR-CON) 10 MEQ tablet Take 10 mEq by mouth every morning.   Yes [provider]  prochlorperazine  (COMPAZINE) 10 MG tablet TAKE 1 TABLET BY MOUTH EVERY 6 HOURS AS NEEDED (NAUSEA OR VOMITING). Patient taking differently: Take 10 mg by mouth daily as needed (nausea/vomiting not relieved by zofran (ondansetron)). 01/05/20  Yes Truitt Merle, MD    Allergies    Patient has no known allergies.  Review of Systems   Review of Systems  Constitutional:  Negative for fever.  HENT:  Negative for ear pain.  Eyes:  Negative for pain.  Respiratory:  Negative for cough.   Cardiovascular:  Negative for chest pain.  Gastrointestinal:  Negative for abdominal pain.  Genitourinary:  Negative for flank pain.  Musculoskeletal:  Negative for back pain.  Skin:  Negative for rash.  Neurological:  Negative for headaches.   Physical Exam Updated Vital Signs BP (!) 169/83 (BP Location: Left Arm)   Pulse 65   Temp 98.1 F (36.7 C) (Oral)   Resp 18   SpO2 100%   Physical Exam Constitutional:      General: She is not in acute distress.    Appearance: Normal appearance.  HENT:     Head: Normocephalic.     Nose: Nose normal.  Eyes:     Extraocular Movements: Extraocular movements intact.  Cardiovascular:     Rate and Rhythm: Normal rate.  Pulmonary:     Effort: Pulmonary effort is normal.  Musculoskeletal:        General: Normal range of motion.     Cervical back: Normal range of motion.     Comments: Protruding bony abnormality noted in the right proximal femur.     Neurological:     General: No focal deficit present.     Mental Status: She is alert. Mental status is at baseline.    ED Results / Procedures / Treatments   Labs (all labs ordered are listed, but only abnormal results are displayed) Labs Reviewed  URINALYSIS, ROUTINE W REFLEX MICROSCOPIC    EKG None  Radiology DG Hip Unilat W or Wo Pelvis 2-3 Views Right  Result Date: 07/01/2020 CLINICAL DATA:  Right hip pain.  Post fall. EXAM: DG HIP (WITH OR WITHOUT PELVIS) 2-3V RIGHT COMPARISON:  None. FINDINGS: There is no  evidence of hip fracture or dislocation. Mild osteoarthritic changes of the hip joints. Vascular calcifications noted. IMPRESSION: 1. No acute fracture or dislocation identified about the right hip. 2. Mild osteoarthritic changes of the hip joints. Electronically Signed   By: Fidela Salisbury M.D.   On: 07/01/2020 19:33   DG Femur Min 2 Views Right  Result Date: 07/01/2020 CLINICAL DATA:  Fall, right leg pain EXAM: RIGHT FEMUR 2 VIEWS COMPARISON:  None. FINDINGS: Suspected right femoral neck fracture without significant displacement. No angulation. Hip joint intact without dislocation. Knee joint within normal limits. Soft tissues are unremarkable. Vascular calcifications are present. IMPRESSION: Suspected right femoral neck fracture without significant displacement. Recommend dedicated right hip radiographs for further evaluation. Electronically Signed   By: Davina Poke D.O.   On: 07/01/2020 18:34    Procedures Procedures   Medications Ordered in ED Medications  oxyCODONE-acetaminophen (PERCOCET/ROXICET) 5-325 MG per tablet 1 tablet (1 tablet Oral Given 07/01/20 1843)    ED Course  I have reviewed the triage vital signs and the nursing notes.  Pertinent labs & imaging results that were available during my care of the patient were reviewed by me and considered in my medical decision making (see chart for details).    MDM Rules/Calculators/A&P                          X-rays suggestive of possible fracture, however follow-up x-ray showed no evidence of fracture per radiology.  Patient given Percocet here with improvement of symptoms.  I discussed at length with family regarding work-up today.  They were concerned that she may have missed a urinary tract infection.  Urine was obtained via catheter, no UTI noted here.  Patient otherwise afebrile vital signs within normal limits.  Advise continued hospice care at home, advised return if she hospice goals or not met if she has  worsening symptoms pain or any additional concerns.  Final Clinical Impression(s) / ED Diagnoses Final diagnoses:  Hip pain    Rx / DC Orders ED Discharge Orders     None        Luna Fuse, MD 07/01/20 2103

## 2020-07-01 NOTE — Discharge Instructions (Addendum)
X-rays ultimately did not show any evidence of fracture.  Urinalysis shows no signs of urinary tract infection.  Continue following up with hospice team, return to the ER if you have worsening pain, or any other concerns.

## 2020-07-01 NOTE — ED Notes (Signed)
Please call daughter when Allison Vaughan is here to pick up pt

## 2020-08-14 DEATH — deceased

## 2020-09-07 ENCOUNTER — Encounter: Payer: Self-pay | Admitting: Hematology

## 2021-01-13 IMAGING — XA IR IMAGING GUIDED PORT INSERTION
2 series · 3 of 3 positions shown · non-contrast
Comparison: Chest CT-05/29/2019

INDICATION: History of pancreatic cancer. In need of durable intravenous access
for chemotherapy administration.

EXAM:
IMPLANTED PORT A CATH PLACEMENT WITH ULTRASOUND AND FLUOROSCOPIC
GUIDANCE

[Series 1: care single · 2 of 2 slices shown]
[im 1/2]
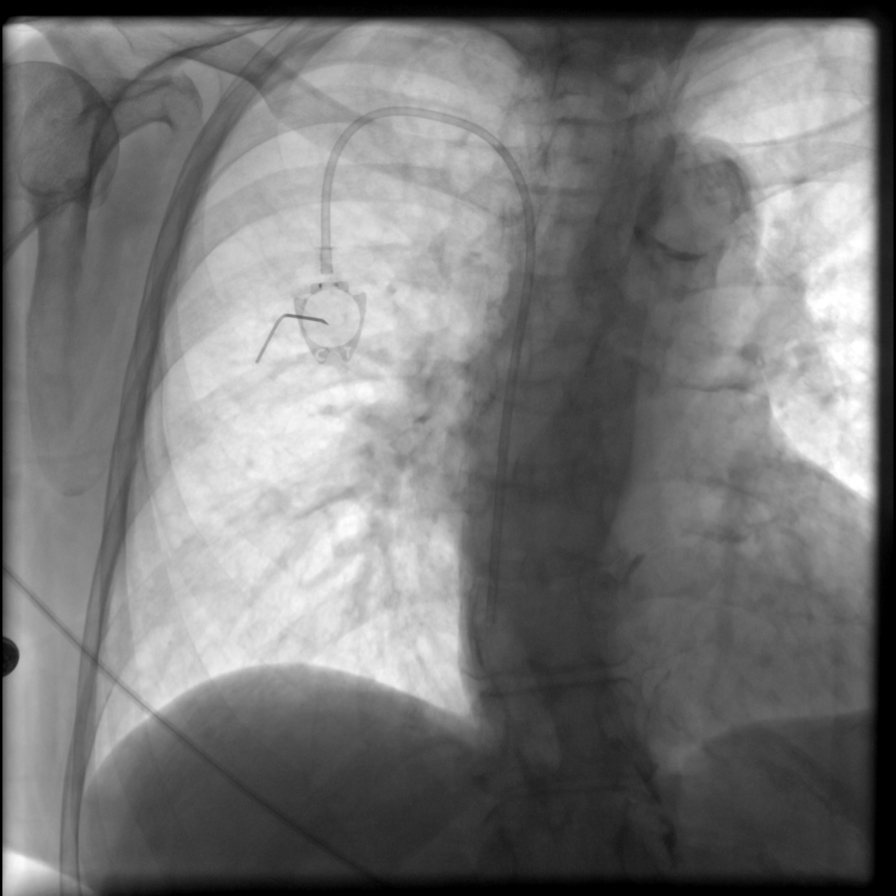
[im 2/2]
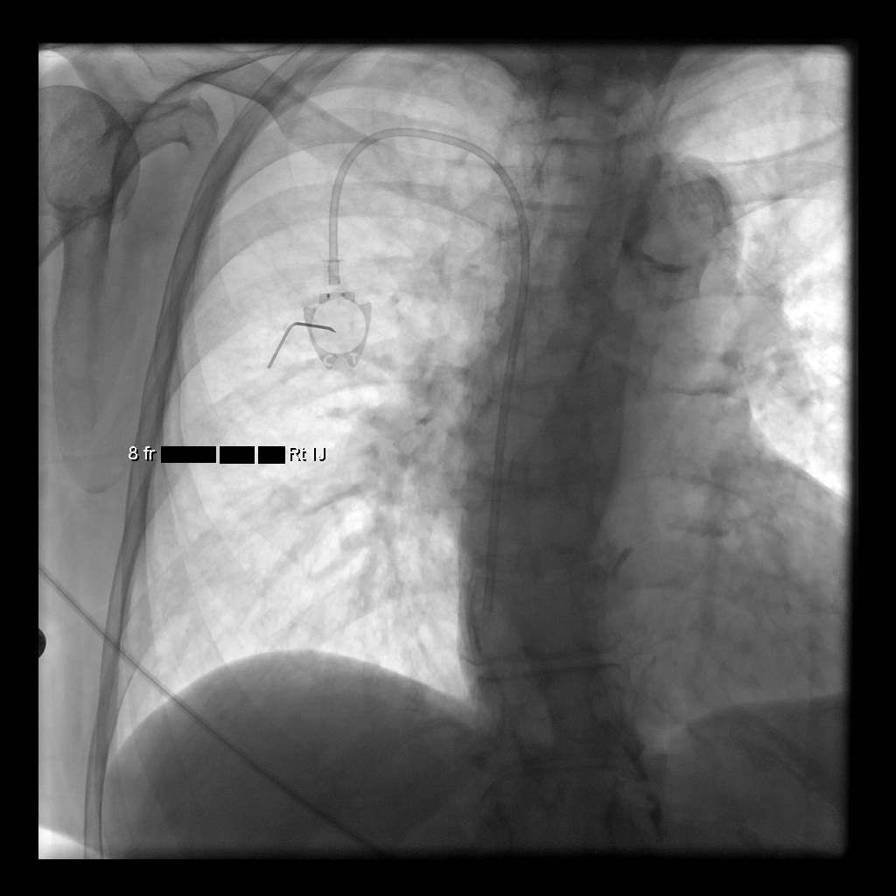

[Series 300: ir fluoro guide cv line right · 1 of 1 slices shown]
[im 1/1]
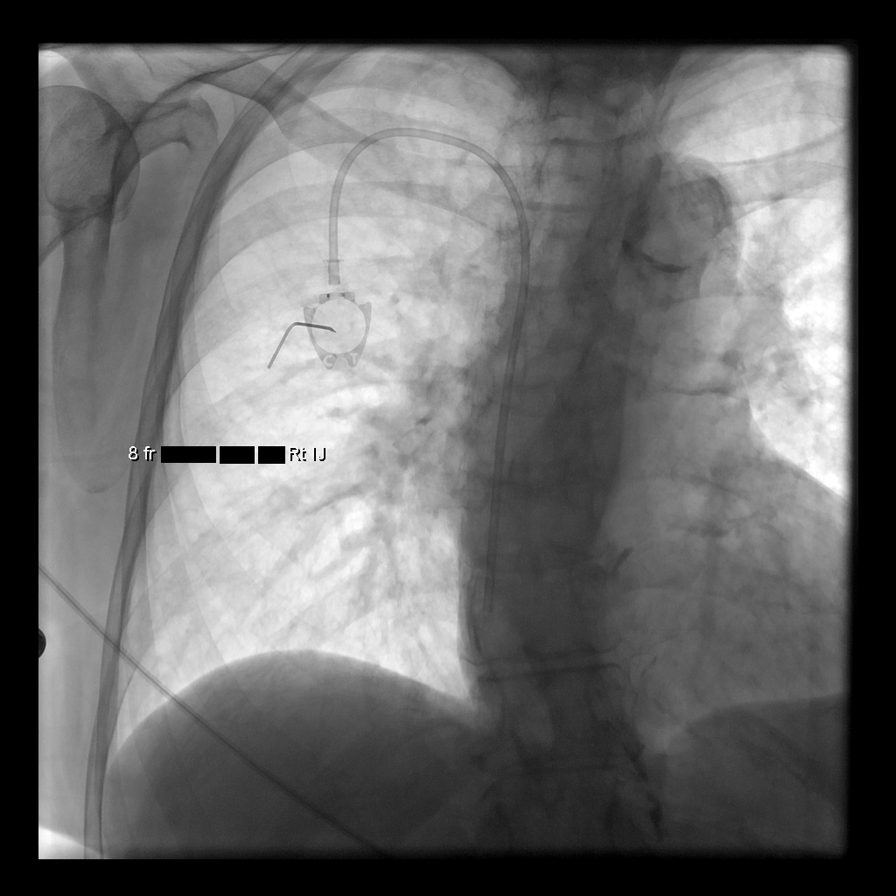

[3 of 3 positions shown; findings below may reference images not displayed]

MEDICATIONS:
Ancef 2 gm IV; The antibiotic was administered within an appropriate
time interval prior to skin puncture.

ANESTHESIA/SEDATION:
Moderate (conscious) sedation was employed during this procedure. A
total of Versed 1 mg and Fentanyl 50 mcg was administered
intravenously.

Moderate Sedation Time: 23 minutes. The patient's level of
consciousness and vital signs were monitored continuously by
radiology nursing throughout the procedure under my direct
supervision.

CONTRAST:  None

FLUOROSCOPY TIME:  18 seconds (4 mGy)

COMPLICATIONS:
None immediate.

PROCEDURE:
The procedure, risks, benefits, and alternatives were explained to
the patient. Questions regarding the procedure were encouraged and
answered. The patient understands and consents to the procedure.

The right neck and chest were prepped with chlorhexidine in a
sterile fashion, and a sterile drape was applied covering the
operative field. Maximum barrier sterile technique with sterile
gowns and gloves were used for the procedure. A timeout was
performed prior to the initiation of the procedure. Local anesthesia
was provided with 1% lidocaine with epinephrine.

After creating a small venotomy incision, a micropuncture kit was
utilized to access the internal jugular vein. Real-time ultrasound
guidance was utilized for vascular access including the acquisition
of a permanent ultrasound image documenting patency of the accessed
vessel. The microwire was utilized to measure appropriate catheter
length.

A subcutaneous port pocket was then created along the upper chest
wall utilizing a combination of sharp and blunt dissection. The
pocket was irrigated with sterile saline. A single lumen "Slim"
sized power injectable port was chosen for placement. The 8 Fr
catheter was tunneled from the port pocket site to the venotomy
incision. The port was placed in the pocket. The external catheter
was trimmed to appropriate length. At the venotomy, an 8 Fr
peel-away sheath was placed over a guidewire under fluoroscopic
guidance. The catheter was then placed through the sheath and the
sheath was removed. Final catheter positioning was confirmed and
documented with a fluoroscopic spot radiograph. The port was
accessed with Aregbesola Manja needle, aspirated and flushed with heparinized
saline.

The venotomy site was closed with an interrupted 4-0 Vicryl suture.
The port pocket incision was closed with interrupted 2-0 Vicryl
suture. The skin was opposed with a running subcuticular 4-0 Vicryl
suture. Dermabond and Jz were applied to both incisions.
Dressings were applied. The patient tolerated the procedure well
without immediate post procedural complication.
FINDINGS: After catheter placement, the tip lies within the superior
cavoatrial junction. The catheter aspirates and flushes normally and
is ready for immediate use.
IMPRESSION: Successful placement of a right internal jugular approach power
injectable Port-A-Cath. The catheter is ready for immediate use.

## 2021-01-30 IMAGING — CR DG CHEST 2V
2 series · 2 of 2 positions shown · non-contrast
Comparison: 07/10/2019

CLINICAL DATA: Shortness of breath

EXAM:
CHEST - 2 VIEW

[chest lat]
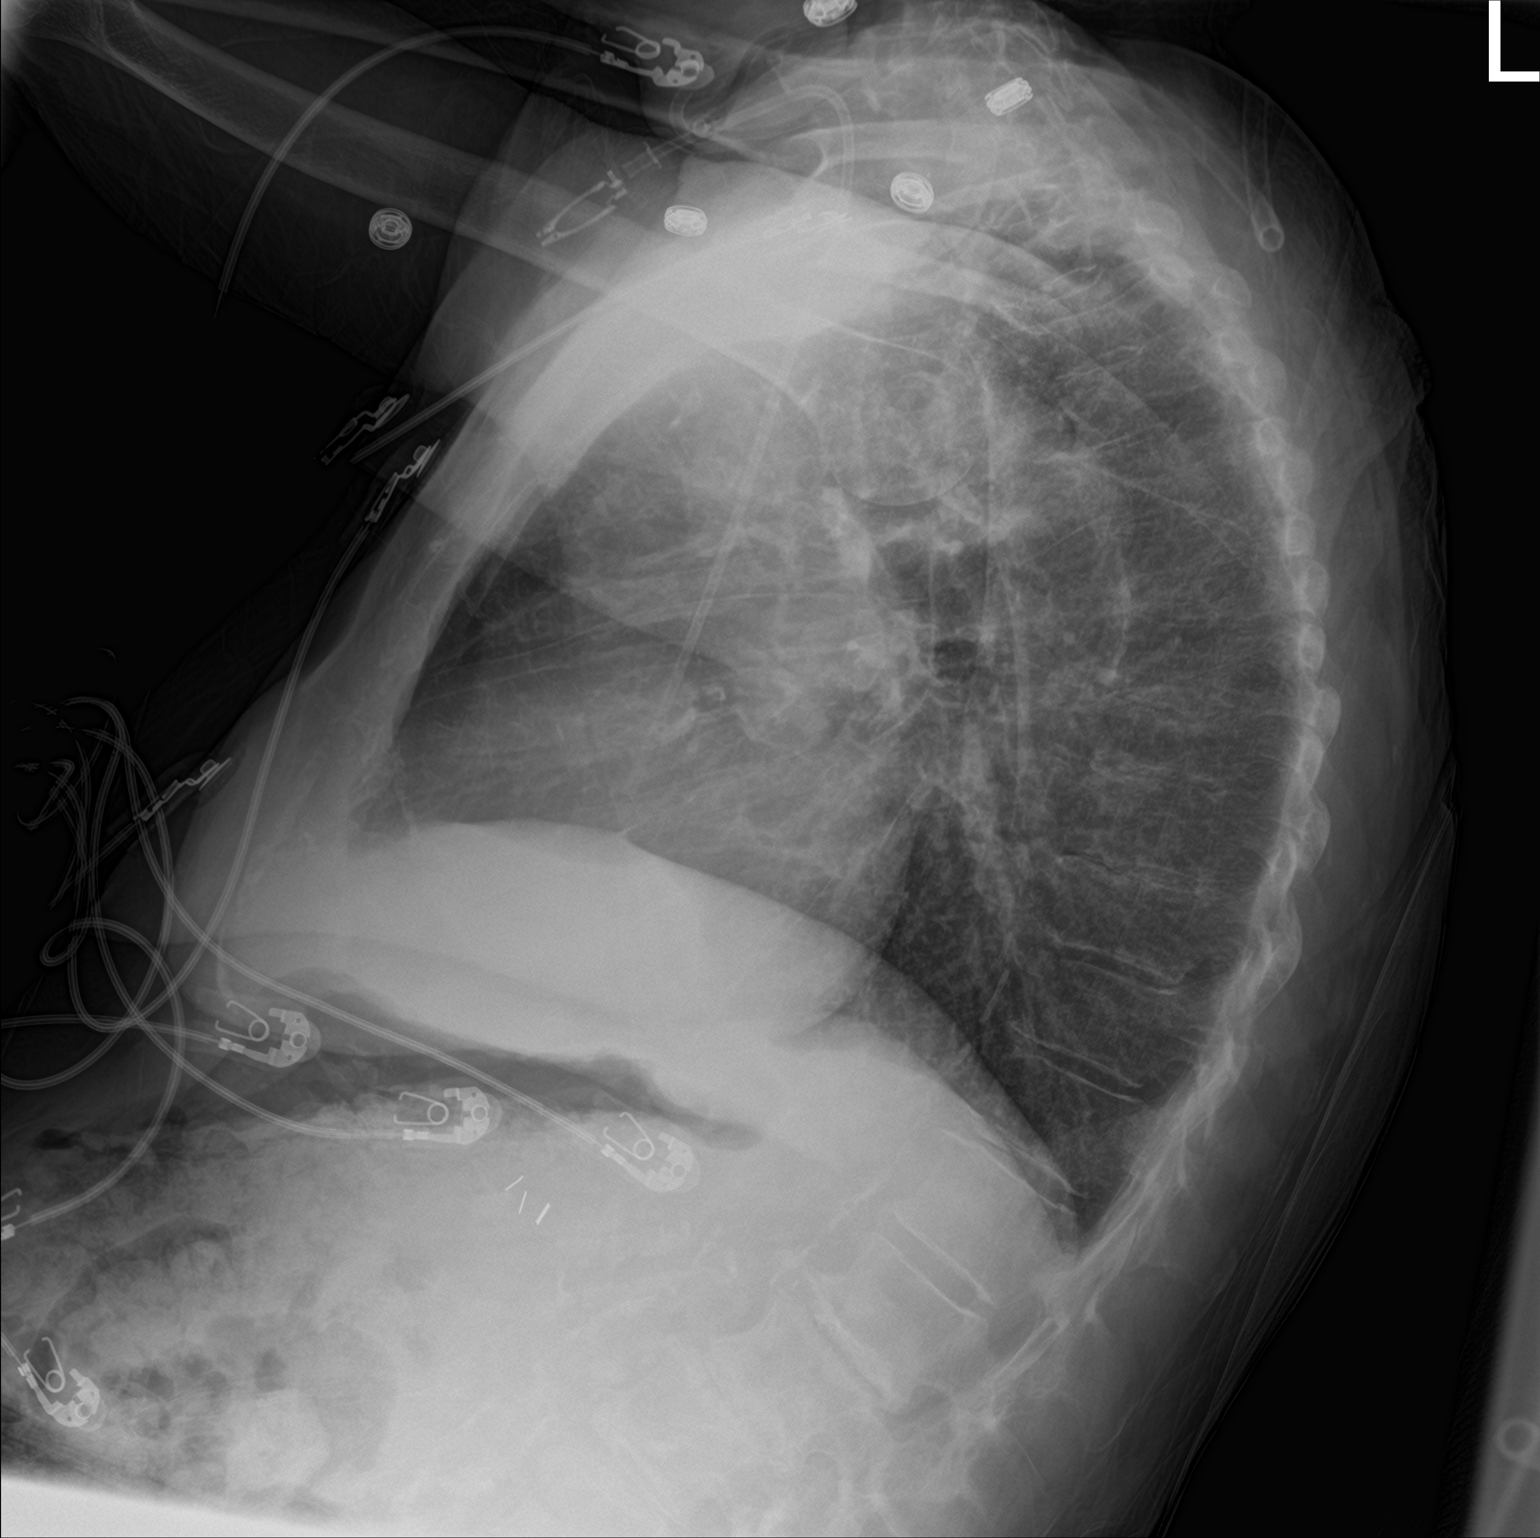

[chest ap]
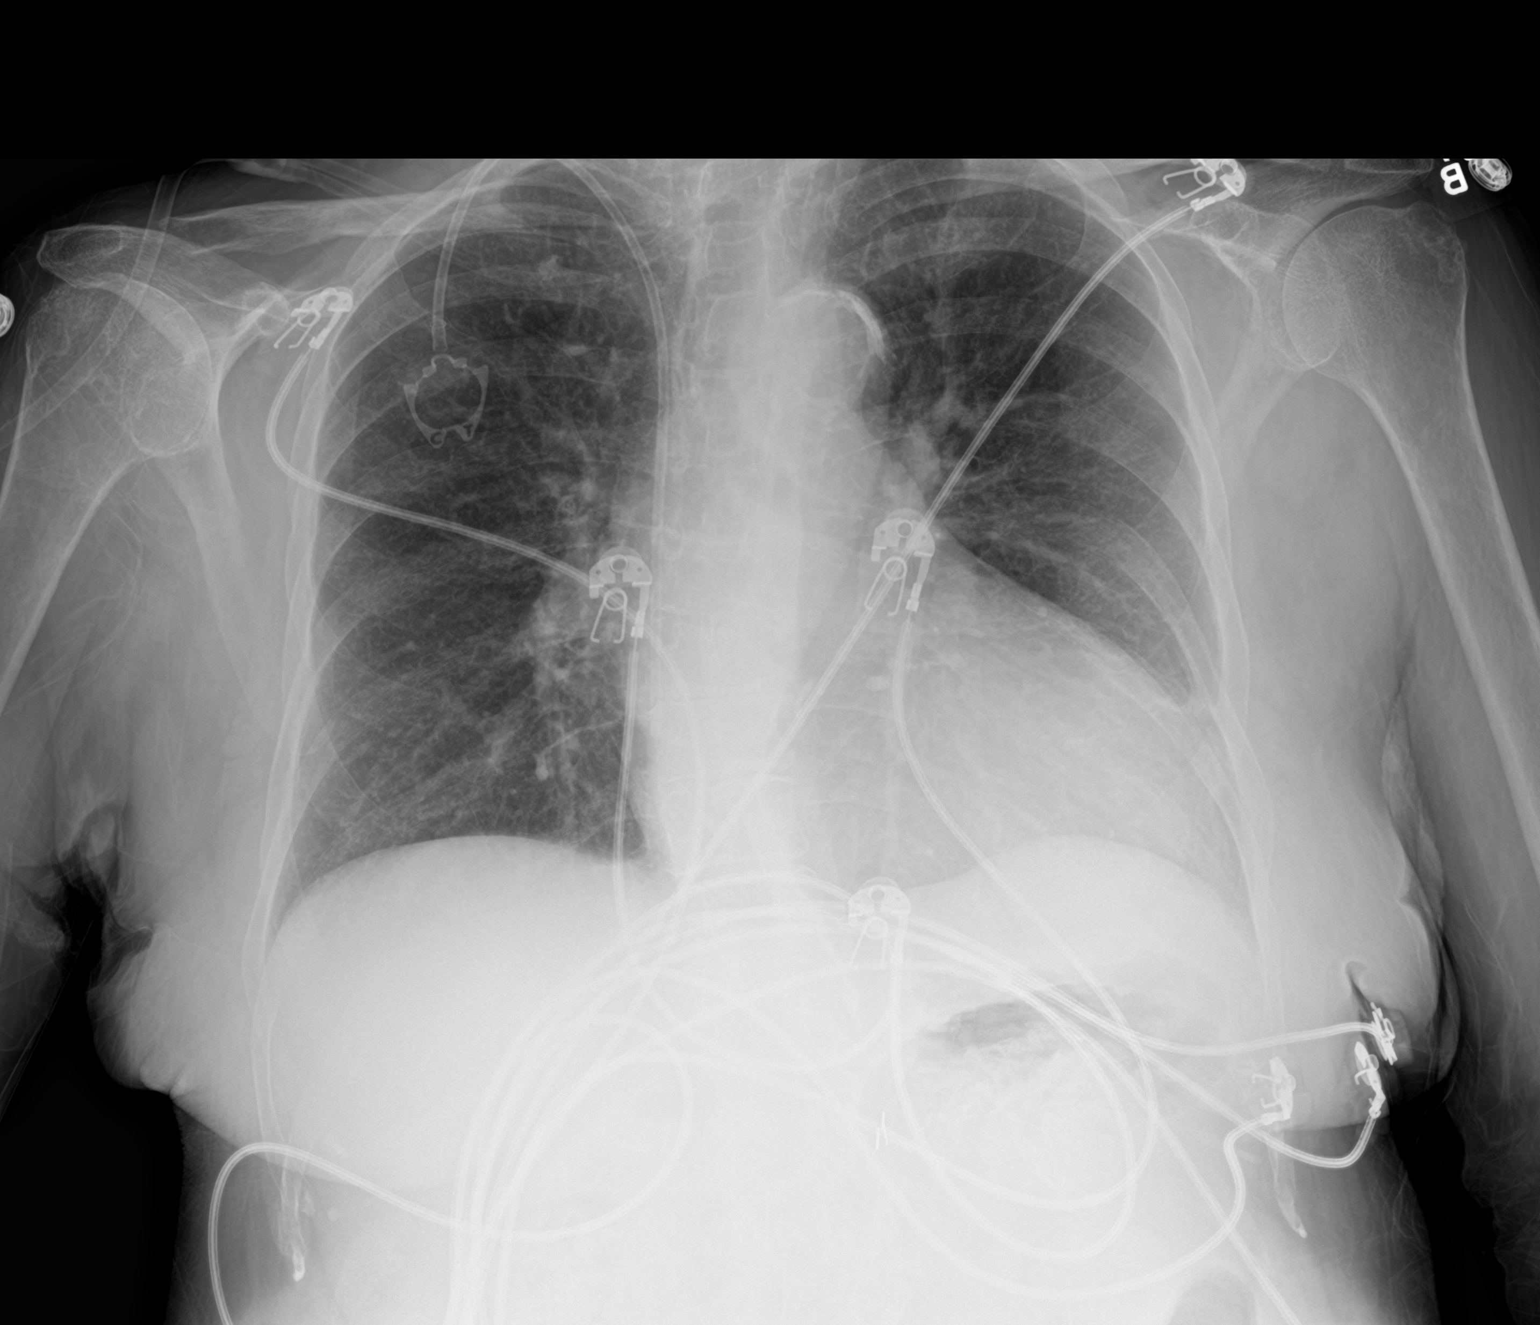

[2 of 2 positions shown; findings below may reference images not displayed]

FINDINGS: Right Port-A-Cath in place with the tip at the cavoatrial junction.
Cardiomegaly, aortic atherosclerosis. Lungs are clear. No effusions
or acute bony abnormality.
IMPRESSION: Mild cardiomegaly.  No active disease.

## 2022-10-29 NOTE — Telephone Encounter (Signed)
TC
